# Patient Record
Sex: Male | Born: 1960 | Race: White | Hispanic: No | Marital: Married | Smoking: Former smoker
Health system: Southern US, Community
[De-identification: ages and names within clinical notes are randomized; demographics above are authoritative.]

## PROBLEM LIST (undated history)

## (undated) DIAGNOSIS — E7800 Pure hypercholesterolemia, unspecified: Secondary | ICD-10-CM

## (undated) DIAGNOSIS — J45909 Unspecified asthma, uncomplicated: Secondary | ICD-10-CM

## (undated) DIAGNOSIS — K5792 Diverticulitis of intestine, part unspecified, without perforation or abscess without bleeding: Secondary | ICD-10-CM

## (undated) DIAGNOSIS — Z87442 Personal history of urinary calculi: Secondary | ICD-10-CM

## (undated) DIAGNOSIS — K219 Gastro-esophageal reflux disease without esophagitis: Secondary | ICD-10-CM

## (undated) DIAGNOSIS — E119 Type 2 diabetes mellitus without complications: Secondary | ICD-10-CM

## (undated) DIAGNOSIS — C3490 Malignant neoplasm of unspecified part of unspecified bronchus or lung: Secondary | ICD-10-CM

## (undated) DIAGNOSIS — Z973 Presence of spectacles and contact lenses: Secondary | ICD-10-CM

## (undated) HISTORY — DX: Type 2 diabetes mellitus without complications: E11.9

## (undated) HISTORY — DX: Malignant neoplasm of unspecified part of unspecified bronchus or lung: C34.90

## (undated) HISTORY — PX: OTHER SURGICAL HISTORY: SHX169

## (undated) HISTORY — PX: COLONOSCOPY: SHX174

## (undated) HISTORY — DX: Pure hypercholesterolemia, unspecified: E78.00

## (undated) HISTORY — PX: KNEE ARTHROSCOPY WITH ANTERIOR CRUCIATE LIGAMENT (ACL) REPAIR: SHX5644

---

## 2017-07-07 DIAGNOSIS — E785 Hyperlipidemia, unspecified: Secondary | ICD-10-CM | POA: Insufficient documentation

## 2017-07-07 DIAGNOSIS — E119 Type 2 diabetes mellitus without complications: Secondary | ICD-10-CM | POA: Insufficient documentation

## 2018-02-15 DIAGNOSIS — K219 Gastro-esophageal reflux disease without esophagitis: Secondary | ICD-10-CM | POA: Insufficient documentation

## 2018-08-31 ENCOUNTER — Encounter: Payer: Self-pay | Admitting: Family Medicine

## 2018-08-31 ENCOUNTER — Emergency Department
Admission: EM | Admit: 2018-08-31 | Discharge: 2018-08-31 | Disposition: A | Discharge status: Home / Self Care | Payer: BLUE CROSS/BLUE SHIELD | Source: Home / Self Care

## 2018-08-31 ENCOUNTER — Other Ambulatory Visit: Payer: Self-pay

## 2018-08-31 DIAGNOSIS — R21 Rash and other nonspecific skin eruption: Secondary | ICD-10-CM

## 2018-08-31 DIAGNOSIS — W57XXXA Bitten or stung by nonvenomous insect and other nonvenomous arthropods, initial encounter: Secondary | ICD-10-CM

## 2018-08-31 NOTE — ED Triage Notes (Signed)
Red itchy bumps on legs since yesterday

## 2018-08-31 NOTE — Discharge Instructions (Addendum)
Treatment for this would include washing these bumps with soap and water twice a day and taking Zyrtec at night to control the itching.  You can also apply ice to the rash to help reduce the itchiness.  You should see the rash disappear over the next 3 days

## 2018-08-31 NOTE — ED Provider Notes (Signed)
[facility]     CSN: [id number] Arrival date & time: 08/31/18  1737     History   Chief Complaint Chief Complaint  Patient presents with  . Rash    HPI Jereme Loren is a 57 y.o. male.   Is a 57 year old man who presents with a rash.  He is a patient with diabetes and hypercholesterolemia.  He has had a rash since yesterday characterized by red bumps on his legs near his knees.  He went outside after the rainstorm yesterday and noted the multiple vesicles and papules about both knees afterwards.   Note from 07/18/2018: Mr. Line presents for evaluation of a wound involving the medial aspect of left ankle. The wound started as a rash 1 week ago. The rash broke open after he scratched resulting in the wound. Lesions are red, and flat in texture.Wound is painful and is pruritic.     History reviewed. No pertinent past medical history.  There are no active problems to display for this patient.   History reviewed. No pertinent surgical history.     Home Medications    Prior to Admission medications   Medication Sig Start Date End Date Taking? Authorizing Provider  glipiZIDE (GLUCOTROL) 10 MG tablet Take 10 mg by mouth daily before breakfast.   Yes [provider]  meloxicam (MOBIC) 15 MG tablet Take 15 mg by mouth daily.   Yes [provider]  metFORMIN (GLUCOPHAGE) 1000 MG tablet Take 1,000 mg by mouth 2 (two) times daily with a meal.   Yes [provider]  pravastatin (PRAVACHOL) 10 MG tablet Take 10 mg by mouth daily.   Yes [provider]    Family History No family history on file.  Social History Social History   Tobacco Use  . Smoking status: Current Every Day Smoker    Packs/day: 1.00    Years: 43.00    Pack years: 43.00    Types: Cigarettes  . Smokeless tobacco: Never Used  Substance Use Topics  . Alcohol use: Not Currently  . Drug use: Not Currently     Allergies   Patient has no known  allergies.   Review of Systems Review of Systems   Physical Exam Triage Vital Signs ED Triage Vitals  Enc Vitals Group     BP      Pulse      Resp      Temp      Temp src      SpO2      Weight      Height      Head Circumference      Peak Flow      Pain Score      Pain Loc      Pain Edu?      Excl. in [place]?    No data found.  Updated Vital Signs BP 117/76 (BP Location: Right Arm)   Pulse 85   Temp 97.6 F (36.4 C) (Oral)   Ht 6' (1.829 m)   Wt 104.3 kg   SpO2 97%   BMI 31.19 kg/m    Physical Exam  Constitutional: He is oriented to person, place, and time. He appears well-developed and well-nourished.  HENT:  Right Ear: External ear normal.  Left Ear: External ear normal.  Eyes: Conjunctivae are normal.  Neck: Normal range of motion. Neck supple.  Pulmonary/Chest: Effort normal.  Musculoskeletal: Normal range of motion.  Neurological: He is alert and oriented to person, place, and  time.  Skin: Skin is warm.  Multiple excoriated papules and vesicles in clusters on the lateral and medial aspects of both knees.  Nursing note and vitals reviewed.    UC Treatments / Results  Labs (all labs ordered are listed, but only abnormal results are displayed) Labs Reviewed - No data to display  EKG None  Radiology No results found.  Procedures Procedures (including critical care time)  Medications Ordered in UC Medications - No data to display  Initial Impression / Assessment and Plan / UC Course  I have reviewed the triage vital signs and the nursing notes.  Pertinent labs & imaging results that were available during my care of the patient were reviewed by me and considered in my medical decision making (see chart for details).    Final Clinical Impressions(s) / UC Diagnoses   Final diagnoses:  Bug bite, initial encounter     Discharge Instructions     Treatment for this would include washing these bumps with soap and water twice a day and taking  Zyrtec at night to control the itching.  You can also apply ice to the rash to help reduce the itchiness.  You should see the rash disappear over the next 3 days    ED Prescriptions    None     Controlled Substance Prescriptions  Controlled Substance Registry consulted? Not Applicable   Robyn Haber, MD 08/31/18 1810

## 2018-09-05 ENCOUNTER — Emergency Department
Admission: EM | Admit: 2018-09-05 | Discharge: 2018-09-05 | Discharge status: Absent without leave | Payer: BLUE CROSS/BLUE SHIELD | Source: Home / Self Care

## 2018-09-19 DIAGNOSIS — Z23 Encounter for immunization: Secondary | ICD-10-CM | POA: Diagnosis not present

## 2018-09-19 DIAGNOSIS — E785 Hyperlipidemia, unspecified: Secondary | ICD-10-CM | POA: Diagnosis not present

## 2018-09-19 DIAGNOSIS — E119 Type 2 diabetes mellitus without complications: Secondary | ICD-10-CM | POA: Diagnosis not present

## 2018-10-21 DIAGNOSIS — E785 Hyperlipidemia, unspecified: Secondary | ICD-10-CM | POA: Diagnosis not present

## 2018-10-21 DIAGNOSIS — M546 Pain in thoracic spine: Secondary | ICD-10-CM | POA: Diagnosis not present

## 2018-10-21 DIAGNOSIS — R05 Cough: Secondary | ICD-10-CM | POA: Diagnosis not present

## 2018-10-21 DIAGNOSIS — Z823 Family history of stroke: Secondary | ICD-10-CM | POA: Diagnosis not present

## 2018-10-23 DIAGNOSIS — M546 Pain in thoracic spine: Secondary | ICD-10-CM | POA: Diagnosis not present

## 2018-10-23 DIAGNOSIS — R05 Cough: Secondary | ICD-10-CM | POA: Diagnosis not present

## 2018-11-02 DIAGNOSIS — R05 Cough: Secondary | ICD-10-CM | POA: Diagnosis not present

## 2018-11-02 DIAGNOSIS — R0789 Other chest pain: Secondary | ICD-10-CM | POA: Diagnosis not present

## 2018-11-02 DIAGNOSIS — J209 Acute bronchitis, unspecified: Secondary | ICD-10-CM | POA: Diagnosis not present

## 2018-11-02 DIAGNOSIS — R072 Precordial pain: Secondary | ICD-10-CM | POA: Diagnosis not present

## 2018-11-02 DIAGNOSIS — F17200 Nicotine dependence, unspecified, uncomplicated: Secondary | ICD-10-CM | POA: Diagnosis not present

## 2018-11-02 DIAGNOSIS — E119 Type 2 diabetes mellitus without complications: Secondary | ICD-10-CM | POA: Diagnosis not present

## 2018-11-02 DIAGNOSIS — R079 Chest pain, unspecified: Secondary | ICD-10-CM | POA: Diagnosis not present

## 2018-11-02 DIAGNOSIS — E785 Hyperlipidemia, unspecified: Secondary | ICD-10-CM | POA: Diagnosis not present

## 2018-11-02 DIAGNOSIS — F419 Anxiety disorder, unspecified: Secondary | ICD-10-CM | POA: Diagnosis not present

## 2018-11-20 DIAGNOSIS — E785 Hyperlipidemia, unspecified: Secondary | ICD-10-CM | POA: Diagnosis not present

## 2018-11-20 DIAGNOSIS — Z823 Family history of stroke: Secondary | ICD-10-CM | POA: Diagnosis not present

## 2018-11-22 DIAGNOSIS — H524 Presbyopia: Secondary | ICD-10-CM | POA: Diagnosis not present

## 2018-11-22 DIAGNOSIS — E119 Type 2 diabetes mellitus without complications: Secondary | ICD-10-CM | POA: Diagnosis not present

## 2019-04-30 DIAGNOSIS — E119 Type 2 diabetes mellitus without complications: Secondary | ICD-10-CM | POA: Diagnosis not present

## 2019-04-30 DIAGNOSIS — E785 Hyperlipidemia, unspecified: Secondary | ICD-10-CM | POA: Diagnosis not present

## 2019-04-30 DIAGNOSIS — Z23 Encounter for immunization: Secondary | ICD-10-CM | POA: Diagnosis not present

## 2019-05-30 DIAGNOSIS — Z20828 Contact with and (suspected) exposure to other viral communicable diseases: Secondary | ICD-10-CM | POA: Diagnosis not present

## 2019-06-19 ENCOUNTER — Encounter: Payer: Self-pay | Admitting: Osteopathic Medicine

## 2019-06-19 ENCOUNTER — Ambulatory Visit (INDEPENDENT_AMBULATORY_CARE_PROVIDER_SITE_OTHER): Payer: BC Managed Care – PPO | Admitting: Osteopathic Medicine

## 2019-06-19 ENCOUNTER — Other Ambulatory Visit: Payer: Self-pay

## 2019-06-19 VITALS — BP 124/75 | HR 75 | Temp 97.9°F | Ht 72.0 in | Wt 235.6 lb

## 2019-06-19 DIAGNOSIS — E119 Type 2 diabetes mellitus without complications: Secondary | ICD-10-CM

## 2019-06-19 MED ORDER — GLIPIZIDE 5 MG PO TABS: 5.0000 mg | ORAL_TABLET | Freq: Every day | ORAL | 3 refills | Status: DC

## 2019-06-19 MED ORDER — ATORVASTATIN CALCIUM 40 MG PO TABS: 40.0000 mg | ORAL_TABLET | Freq: Every day | ORAL | 3 refills | Status: DC

## 2019-06-19 MED ORDER — METFORMIN HCL 1000 MG PO TABS: 1000.0000 mg | ORAL_TABLET | Freq: Two times a day (BID) | ORAL | 3 refills | Status: DC

## 2019-06-19 MED ORDER — MELOXICAM 15 MG PO TABS: 15.0000 mg | ORAL_TABLET | Freq: Every day | ORAL | 3 refills | Status: DC

## 2019-06-19 NOTE — Progress Notes (Signed)
HPI: William Farrell is a 58 y.o. male who  has a past medical history of Diabetes ([place]) and High cholesterol.  he presents to [facility] today, 06/19/19,  for chief complaint of: New to establish Diabetes    DIABETES SCREENING/PREVENTIVE CARE: A1C past 3-6 mos: Yes  controlled? Yes   04/30/2019 (see CE): 6.6% Current meds: Metformin 1000 mg twice daily, glipizide 5 mg daily  BP goal <130/80: Yes   BP Readings from Last 3 Encounters:  06/19/19 124/75  08/31/18 117/76   LDL goal <70: close enough! 71 in 04/2019 Eye exam annually: none on file , importance discussed with patient Foot exam: No  Microalbuminuria:needs Metformin: Yes  ACE/ARB: No  Antiplatelet if ASCVD Risk >10%: Yes  Statin: Yes  Pneumovax: yes   Immunization History  Administered Date(s) Administered  . Influenza Split 07/24/2018  . Influenza, Seasonal, Injecte, Preservative Fre 09/16/2016, 07/16/2017  . Influenza,inj,Quad PF,6+ Mos 09/16/2016, 07/31/2018  . Pneumococcal Polysaccharide-23 04/30/2019  . Tdap 09/19/2018     Past medical, surgical, social and family history reviewed:  There are no active problems to display for this patient.   Past Surgical History:  Procedure Laterality Date  . broken bone repair      Social History   Tobacco Use  . Smoking status: Current Every Day Smoker    Packs/day: 1.00    Years: 43.00    Pack years: 43.00    Types: Cigarettes  . Smokeless tobacco: Never Used  Substance Use Topics  . Alcohol use: Not Currently    Family History  Problem Relation Age of Onset  . High blood pressure Mother   . Stroke Mother      Current medication list and allergy/intolerance information reviewed:    Current Outpatient Medications  Medication Sig Dispense Refill  . glipiZIDE (GLUCOTROL) 5 MG tablet Take 1 tablet (5 mg total) by mouth daily before breakfast. 90 tablet 3  . meloxicam (MOBIC) 15 MG tablet Take 1 tablet (15 mg  total) by mouth daily. 90 tablet 3  . metFORMIN (GLUCOPHAGE) 1000 MG tablet Take 1 tablet (1,000 mg total) by mouth 2 (two) times daily with a meal. 180 tablet 3  . atorvastatin (LIPITOR) 40 MG tablet Take 1 tablet (40 mg total) by mouth daily. 90 tablet 3   No current facility-administered medications for this visit.     Allergies  Allergen Reactions  . Sitagliptin Other (See Comments)    headache      Review of Systems:  Constitutional:  No  fever, no chills, No recent illness, No unintentional weight changes. No significant fatigue.   HEENT: No  headache, no vision change, no hearing change, No sore throat, No  sinus pressure  Cardiac: No  chest pain, No  pressure, No palpitations, No  Orthopnea  Respiratory:  No  shortness of breath. No  Cough  Gastrointestinal: No  abdominal pain, No  nausea, No  vomiting,  No  blood in stool, No  diarrhea, No  constipation   Musculoskeletal: No new myalgia/arthralgia  Skin: No  Rash, No other wounds/concerning lesions  Genitourinary: No  incontinence, No  abnormal genital bleeding, No abnormal genital discharge  Hem/Onc: No  easy bruising/bleeding, No  abnormal lymph node  Endocrine: No cold intolerance,  No heat intolerance. No polyuria/polydipsia/polyphagia   Neurologic: No  weakness, No  dizziness, No  slurred speech/focal weakness/facial droop  Psychiatric: No  concerns with depression, No  concerns with anxiety, No sleep problems, No  mood problems  Exam:  BP 124/75 (BP Location: Left Arm, Patient Position: Sitting, Cuff Size: Normal)   Pulse 75   Temp 97.9 F (36.6 C) (Oral)   Ht 6' (1.829 m)   Wt 235 lb 9.6 oz (106.9 kg)   BMI 31.95 kg/m   Constitutional: VS see above. General Appearance: alert, well-developed, well-nourished, NAD  Eyes: Normal lids and conjunctive, non-icteric sclera  Neck: No masses, trachea midline. No thyroid enlargement. No tenderness/mass appreciated. No lymphadenopathy  Respiratory: Normal  respiratory effort. no wheeze, no rhonchi, no rales  Cardiovascular: S1/S2 normal, no murmur, no rub/gallop auscultated. RRR. No lower extremity edema.  Gastrointestinal: Nontender, no masses. No hepatomegaly, no splenomegaly. No hernia appreciated. Bowel sounds normal. Rectal exam deferred.   Musculoskeletal: Gait normal. No clubbing/cyanosis of digits.   Neurological: Normal balance/coordination. No tremor. No cranial nerve deficit on limited exam. Motor and sensation intact and symmetric. Cerebellar reflexes intact.   Skin: warm, dry, intact. No rash/ulcer. No concerning nevi or subq nodules on limited exam.    Psychiatric: Normal judgment/insight. Normal mood and affect. Oriented x3.    No results found for this or any previous visit (from the past 72 hour(s)).  No results found.   ASSESSMENT/PLAN: The encounter diagnosis was Controlled type 2 diabetes mellitus without complication, without long-term current use of insulin ([place]).   Orders Placed This Encounter  Procedures  . Hemoglobin A1c    Meds ordered this encounter  Medications  . glipiZIDE (GLUCOTROL) 5 MG tablet    Sig: Take 1 tablet (5 mg total) by mouth daily before breakfast.    Dispense:  90 tablet    Refill:  3  . metFORMIN (GLUCOPHAGE) 1000 MG tablet    Sig: Take 1 tablet (1,000 mg total) by mouth 2 (two) times daily with a meal.    Dispense:  180 tablet    Refill:  3  . atorvastatin (LIPITOR) 40 MG tablet    Sig: Take 1 tablet (40 mg total) by mouth daily.    Dispense:  90 tablet    Refill:  3  . meloxicam (MOBIC) 15 MG tablet    Sig: Take 1 tablet (15 mg total) by mouth daily.    Dispense:  90 tablet    Refill:  3       Visit summary with medication list and pertinent instructions was printed for patient to review. All questions at time of visit were answered - patient instructed to contact office with any additional concerns or updates. ER/RTC precautions were reviewed with the patient.     Please note: voice recognition software was used to produce this document, and typos may escape review. Please contact Dr. Sheppard Coil for any needed clarifications.     Follow-up plan: Return in about 6 weeks (around 07/31/2019) for LAB VISIT ONLY, and virtual visit w/ Dr Sheppard Coil re: Diabetes follow-up 2-3 days after that .

## 2019-08-01 ENCOUNTER — Ambulatory Visit: Payer: BC Managed Care – PPO | Admitting: Osteopathic Medicine

## 2019-09-19 ENCOUNTER — Telehealth: Payer: Self-pay

## 2019-09-19 MED ORDER — GLUCOSE BLOOD VI STRP: ORAL_STRIP | 99 refills | Status: DC

## 2019-09-19 NOTE — Telephone Encounter (Signed)
[facility] m/o pharmacy requesting 90 d/s for one touch ultra test strips 100's. New rx request.

## 2019-11-14 ENCOUNTER — Telehealth: Payer: Self-pay

## 2019-11-14 MED ORDER — ONETOUCH DELICA LANCETS 33G MISC: 99 refills | Status: DC

## 2019-11-14 NOTE — Telephone Encounter (Signed)
[facility] mail order pharmacy requesting a new rx for pt. Requesting lancets one touch delica (#992, 42A). Rx not listed in active med list.

## 2020-01-09 ENCOUNTER — Telehealth: Payer: Self-pay | Admitting: Osteopathic Medicine

## 2020-01-09 DIAGNOSIS — E119 Type 2 diabetes mellitus without complications: Secondary | ICD-10-CM

## 2020-01-09 DIAGNOSIS — Z0000 Encounter for general adult medical examination without abnormal findings: Secondary | ICD-10-CM

## 2020-01-09 NOTE — Telephone Encounter (Signed)
Wife in office today, requesting labs for his annual checkup

## 2020-01-15 LAB — CBC
HCT: 40.1 % (ref 38.5–50.0)
Hemoglobin: 13.6 g/dL (ref 13.2–17.1)
MCH: 29 pg (ref 27.0–33.0)
MCHC: 33.9 g/dL (ref 32.0–36.0)
MCV: 85.5 fL (ref 80.0–100.0)
MPV: 10.9 fL (ref 7.5–12.5)
Platelets: 232 10*3/uL (ref 140–400)
RBC: 4.69 10*6/uL (ref 4.20–5.80)
RDW: 13.3 % (ref 11.0–15.0)
WBC: 10.4 10*3/uL (ref 3.8–10.8)

## 2020-01-15 LAB — LIPID PANEL
Cholesterol: 138 mg/dL (ref <200)
HDL: 37 mg/dL — ABNORMAL LOW (ref >=40)
LDL Cholesterol (Calc): 73 mg/dL (calc)
Non-HDL Cholesterol (Calc): 101 mg/dL (calc) (ref <130)
Total CHOL/HDL Ratio: 3.7 (calc) (ref <5.0)
Triglycerides: 182 mg/dL — ABNORMAL HIGH (ref <150)

## 2020-01-15 LAB — COMPLETE METABOLIC PANEL WITH GFR
AG Ratio: 1.7 (calc) (ref 1.0–2.5)
ALT: 22 U/L (ref 9–46)
AST: 17 U/L (ref 10–35)
Albumin: 4.3 g/dL (ref 3.6–5.1)
Alkaline phosphatase (APISO): 117 U/L (ref 35–144)
BUN: 17 mg/dL (ref 7–25)
CO2: 25 mmol/L (ref 20–32)
Calcium: 9.3 mg/dL (ref 8.6–10.3)
Chloride: 103 mmol/L (ref 98–110)
Creat: 1.1 mg/dL (ref 0.70–1.33)
GFR, Est African American: 85 mL/min/{1.73_m2} (ref >=60)
GFR, Est Non African American: 73 mL/min/{1.73_m2} (ref >=60)
Globulin: 2.5 g/dL (calc) (ref 1.9–3.7)
Glucose, Bld: 164 mg/dL — ABNORMAL HIGH (ref 65–99)
Potassium: 4.4 mmol/L (ref 3.5–5.3)
Sodium: 136 mmol/L (ref 135–146)
Total Bilirubin: 0.4 mg/dL (ref 0.2–1.2)
Total Protein: 6.8 g/dL (ref 6.1–8.1)

## 2020-01-15 LAB — HEMOGLOBIN A1C
Hgb A1c MFr Bld: 7.1 % of total Hgb — ABNORMAL HIGH (ref <5.7)
Mean Plasma Glucose: 157 (calc)
eAG (mmol/L): 8.7 (calc)

## 2020-01-15 LAB — PSA, TOTAL WITH REFLEX TO PSA, FREE: PSA, Total: 0.3 ng/mL (ref <=4.0)

## 2020-01-17 ENCOUNTER — Encounter: Payer: Self-pay | Admitting: Osteopathic Medicine

## 2020-01-17 ENCOUNTER — Ambulatory Visit (INDEPENDENT_AMBULATORY_CARE_PROVIDER_SITE_OTHER): Payer: BC Managed Care – PPO | Admitting: Osteopathic Medicine

## 2020-01-17 VITALS — BP 110/67 | HR 89 | Temp 97.8°F | Wt 235.0 lb

## 2020-01-17 DIAGNOSIS — E782 Mixed hyperlipidemia: Secondary | ICD-10-CM | POA: Diagnosis not present

## 2020-01-17 DIAGNOSIS — E119 Type 2 diabetes mellitus without complications: Secondary | ICD-10-CM

## 2020-01-17 NOTE — Progress Notes (Signed)
William Farrell is a 59 y.o. male who presents to  [place] at [facility]  today, 01/17/20, seeking care for the following: . DM2 follow-up - stable . HLD follow-up - stable   ASSESSMENT & PLAN with other pertinent history/findings:  The primary encounter diagnosis was Type 2 diabetes mellitus without complication, without long-term current use of insulin ([place]). A diagnosis of Mixed hyperlipidemia was also pertinent to this visit.  Labs reviewed in detail w/ patient  A1C up a bit, will work on diet/exercise  Cholesterol stable Pt otherwise feeling well!    Follow-up instructions: Return in about 4 months (around 05/18/2020) for recheck A1C - see me sooner if needed.                                         BP 110/67 (BP Location: Left Arm, Patient Position: Sitting, Cuff Size: Normal)   Pulse 89   Temp 97.8 F (36.6 C) (Oral)   Wt 235 lb (106.6 kg)   BMI 31.87 kg/m   Current Meds  Medication Sig  . atorvastatin (LIPITOR) 40 MG tablet Take 1 tablet (40 mg total) by mouth daily.  Marland Kitchen glipiZIDE (GLUCOTROL) 5 MG tablet Take 1 tablet (5 mg total) by mouth daily before breakfast.  . glucose blood test strip Use as instructed up to qid  . meloxicam (MOBIC) 15 MG tablet Take 1 tablet (15 mg total) by mouth daily.  . metFORMIN (GLUCOPHAGE) 1000 MG tablet Take 1 tablet (1,000 mg total) by mouth 2 (two) times daily with a meal.  . OneTouch Delica Lancets 93T MISC As directed up to qid    No results found for this or any previous visit (from the past 29 hour(s)).  No results found.  Depression screen [facility] 2/9 01/17/2020 06/19/2019  Decreased Interest 0 0  Down, Depressed, Hopeless 0 0  PHQ - 2 Score 0 0  Altered sleeping 0 -  Tired, decreased energy 0 -  Change in appetite 0 -  Feeling bad or failure about yourself  0 -  Trouble concentrating 0 -  Moving slowly or fidgety/restless 0 -  Suicidal  thoughts 0 -  PHQ-9 Score 0 -    GAD 7 : Generalized Anxiety Score 01/17/2020 06/19/2019  Nervous, Anxious, on Edge 0 0  Control/stop worrying 0 0  Worry too much - different things 0 0  Trouble relaxing 0 0  Restless 0 0  Easily annoyed or irritable 0 0  Afraid - awful might happen 0 0  Total GAD 7 Score 0 0  Anxiety Difficulty - Somewhat difficult      All questions at time of visit were answered - patient instructed to contact office with any additional concerns or updates.  ER/RTC precautions were reviewed with the patient.  Please note: voice recognition software was used to produce this document, and typos may escape review. Please contact Dr. Sheppard Coil for any needed clarifications.   Total encounter time: 30 minutes.

## 2020-01-31 ENCOUNTER — Other Ambulatory Visit: Payer: Self-pay | Admitting: *Deleted

## 2020-01-31 DIAGNOSIS — F17210 Nicotine dependence, cigarettes, uncomplicated: Secondary | ICD-10-CM

## 2020-01-31 DIAGNOSIS — Z87891 Personal history of nicotine dependence: Secondary | ICD-10-CM

## 2020-01-31 NOTE — Progress Notes (Signed)
Chest  

## 2020-03-04 NOTE — Progress Notes (Signed)
Shared Decision Making Visit Lung Cancer Screening Program [phone])   Eligibility:  Age 59 y.o.  Pack Years Smoking History Calculation 43 pack year smoking hisotry (# packs/per year x # years smoked)  Recent History of coughing up blood  no  Unexplained weight loss? no ( >Than 15 pounds within the last 6 months )  Prior History Lung / other cancer no (Diagnosis within the last 5 years already requiring surveillance chest CT Scans).  Smoking Status Current Smoker  Former Smokers: Years since quit: NA  Quit Date: NA  Visit Components:  Discussion included one or more decision making aids. yes  Discussion included risk/benefits of screening. yes  Discussion included potential follow up diagnostic testing for abnormal scans. yes  Discussion included meaning and risk of over diagnosis. yes  Discussion included meaning and risk of False Positives. yes  Discussion included meaning of total radiation exposure. yes  Counseling Included:  Importance of adherence to annual lung cancer LDCT screening. yes  Impact of comorbidities on ability to participate in the program. yes  Ability and willingness to under diagnostic treatment. yes  Smoking Cessation Counseling:  Current Smokers:   Discussed importance of smoking cessation. yes  Information about tobacco cessation classes and interventions provided to patient. yes  Patient provided with "ticket" for LDCT Scan. yes  Symptomatic Patient. no  Counseling  Diagnosis Code: Tobacco Use Z72.0  Asymptomatic Patient yes  Counseling (Intermediate counseling: > three minutes counseling) Z3007  Former Smokers:   Discussed the importance of maintaining cigarette abstinence. yes  Diagnosis Code: Personal History of Nicotine Dependence. M22.633  Information about tobacco cessation classes and interventions provided to patient. Yes  Patient provided with "ticket" for LDCT Scan. yes  Written Order for Lung Cancer  Screening with LDCT placed in Epic. Yes (CT Chest Lung Cancer Screening Low Dose W/O CM) [id number] Z12.2-Screening of respiratory organs Z87.891-Personal history of nicotine dependence  I have spent 25 minutes of face to face time with Mr. Ruby discussing the risks and benefits of lung cancer screening. We viewed a power point together that explained in detail the above noted topics. We paused at intervals to allow for questions to be asked and answered to ensure understanding.We discussed that the single most powerful action that he can take to decrease his risk of developing lung cancer is to quit smoking. We discussed whether or not he is ready to commit to setting a quit date. We discussed options for tools to aid in quitting smoking including nicotine replacement therapy, non-nicotine medications, support groups, Quit Smart classes, and behavior modification. We discussed that often times setting smaller, more achievable goals, such as eliminating 1 cigarette a day for a week and then 2 cigarettes a day for a week can be helpful in slowly decreasing the number of cigarettes smoked. This allows for a sense of accomplishment as well as providing a clinical benefit. I gave him the " Be Stronger Than Your Excuses" card with contact information for community resources, classes, free nicotine replacement therapy, and access to mobile apps, text messaging, and on-line smoking cessation help. I have also given him my card and contact information in the event he needs to contact me. We discussed the time and location of the scan, and that either Doroteo Glassman RN or I will call with the results within 24-48 hours of receiving them. I have offered him  a copy of the power point we viewed  as a resource in the event they need reinforcement of  the concepts we discussed today in the office. The patient verbalized understanding of all of  the above and had no further questions upon leaving the office. They have my  contact information in the event they have any further questions.  I spent 4 minutes counseling on smoking cessation and the health risks of continued tobacco abuse.  I explained to the patient that there has been a high incidence of coronary artery disease noted on these exams. I explained that this is a non-gated exam therefore degree or severity cannot be determined. This patient is currently on statin therapy. I have asked the patient to follow-up with their PCP regarding any incidental finding of coronary artery disease and management with diet or medication as their PCP  feels is clinically indicated. The patient verbalized understanding of the above and had no further questions upon completion of the visit.   Pt. States he is trying to cut down on the number of cigarettes he smokes daily. States his job is stressful, which is why he has not been successful at smoking cessation in the past.   Magdalen Spatz, NP  03/05/2020

## 2020-03-05 ENCOUNTER — Other Ambulatory Visit: Payer: Self-pay

## 2020-03-05 ENCOUNTER — Encounter: Payer: Self-pay | Admitting: Acute Care

## 2020-03-05 ENCOUNTER — Ambulatory Visit (INDEPENDENT_AMBULATORY_CARE_PROVIDER_SITE_OTHER): Payer: BC Managed Care – PPO

## 2020-03-05 ENCOUNTER — Telehealth: Payer: Self-pay | Admitting: Acute Care

## 2020-03-05 ENCOUNTER — Ambulatory Visit (INDEPENDENT_AMBULATORY_CARE_PROVIDER_SITE_OTHER): Payer: BC Managed Care – PPO | Admitting: Acute Care

## 2020-03-05 DIAGNOSIS — Z716 Tobacco abuse counseling: Secondary | ICD-10-CM | POA: Diagnosis not present

## 2020-03-05 DIAGNOSIS — F17210 Nicotine dependence, cigarettes, uncomplicated: Secondary | ICD-10-CM

## 2020-03-05 DIAGNOSIS — Z87891 Personal history of nicotine dependence: Secondary | ICD-10-CM | POA: Diagnosis not present

## 2020-03-05 DIAGNOSIS — Z122 Encounter for screening for malignant neoplasm of respiratory organs: Secondary | ICD-10-CM | POA: Diagnosis not present

## 2020-03-05 NOTE — Telephone Encounter (Signed)
Lung CA screening dated 03/05/20:     IMPRESSION: Lung-RADS 4B, suspicious. Additional imaging evaluation or consultation with Pulmonology or Thoracic Surgery recommended.  Masslike opacity in the medial right upper lobe/suprahilar region, measuring approximately 3.8 cm highly suspicious for primary bronchogenic neoplasm. Associated mediastinal lymphadenopathy, suspicious for nodal metastases. Discussion at multidisciplinary tumor board is suggested. Consider bronchoscopy and/or PET-CT, as clinically warranted.  These results will be called to the ordering clinician or representative by the Radiologist Assistant, and communication documented in the PACS or [employer].  Aortic Atherosclerosis (ICD10-I70.0) and Emphysema (ICD10-J43.9).   Electronically Signed   By: Julian Hy M.D.   On: 03/05/2020 15:35  Routing to Judson Roch marked urgent

## 2020-03-05 NOTE — Patient Instructions (Signed)
Thank you for participating in the [facility] Cancer Screening Program. It was our pleasure to meet you today. We will call you with the results of your scan within the next few days. Your scan will be assigned a Lung RADS category score by the physicians reading the scans.  This Lung RADS score determines follow up scanning.  See below for description of categories, and follow up screening recommendations. We will be in touch to schedule your follow up screening annually or based on recommendations of our providers. We will fax a copy of your scan results to your Primary Care Physician, or the physician who referred you to the program, to ensure they have the results. Please call the office if you have any questions or concerns regarding your scanning experience or results.  Our office number is [phone]. Please speak with Denise Phelps, RN. She is our Lung Cancer Screening RN. If she is unavailable when you call, please have the office staff send her a message. She will return your call at her earliest convenience. Remember, if your scan is normal, we will scan you annually as long as you continue to meet the criteria for the program. (Age 55-77, Current smoker or smoker who has quit within the last 15 years). If you are a smoker, remember, quitting is the single most powerful action that you can take to decrease your risk of lung cancer and other pulmonary, breathing related problems. We know quitting is hard, and we are here to help.  Please let us know if there is anything we can do to help you meet your goal of quitting. If you are a former smoker, congratulations. We are proud of you! Remain smoke free! Remember you can refer friends or family members through the number above.  We will screen them to make sure they meet criteria for the program. Thank you for helping us take better care of you by participating in Lung Screening.  Lung RADS Categories:  Lung RADS 1: no nodules  or definitely non-concerning nodules.  Recommendation is for a repeat annual scan in 12 months.  Lung RADS 2:  nodules that are non-concerning in appearance and behavior with a very low likelihood of becoming an active cancer. Recommendation is for a repeat annual scan in 12 months.  Lung RADS 3: nodules that are probably non-concerning , includes nodules with a low likelihood of becoming an active cancer.  Recommendation is for a 6-month repeat screening scan. Often noted after an upper respiratory illness. We will be in touch to make sure you have no questions, and to schedule your 6-month scan.  Lung RADS 4 A: nodules with concerning findings, recommendation is most often for a follow up scan in 3 months or additional testing based on our provider's assessment of the scan. We will be in touch to make sure you have no questions and to schedule the recommended 3 month follow up scan.  Lung RADS 4 B:  indicates findings that are concerning. We will be in touch with you to schedule additional diagnostic testing based on our provider's  assessment of the scan.   

## 2020-03-06 ENCOUNTER — Telehealth: Payer: Self-pay | Admitting: Acute Care

## 2020-03-06 ENCOUNTER — Ambulatory Visit: Payer: BC Managed Care – PPO | Admitting: Pulmonary Disease

## 2020-03-06 ENCOUNTER — Other Ambulatory Visit: Payer: Self-pay

## 2020-03-06 ENCOUNTER — Encounter: Payer: Self-pay | Admitting: Pulmonary Disease

## 2020-03-06 VITALS — BP 118/76 | HR 71 | Ht 72.0 in | Wt 235.4 lb

## 2020-03-06 DIAGNOSIS — R918 Other nonspecific abnormal finding of lung field: Secondary | ICD-10-CM

## 2020-03-06 DIAGNOSIS — R590 Localized enlarged lymph nodes: Secondary | ICD-10-CM | POA: Diagnosis not present

## 2020-03-06 NOTE — Progress Notes (Signed)
Synopsis: Referred in April 2021 for abnormal CT chest by No ref. provider found  Subjective:   PATIENT ID: William Farrell: male DOB: Mar 30, 1961, MRN: [id number]  Chief Complaint  Patient presents with  . Consult    Pt had a shared decision visit with Eric Form and after having the CT performed, pt is now here for the visit with BI. Pt states he does have occ complaints with SOB due to exertion.    This is a 59 year old gentleman past medical history of diabetes and high cholesterol.  Patient was enrolled in our lung cancer screening program.  Patient was seen yesterday completed his shared decision-making visit and had his initial lung cancer screening completed.  This revealed a 3.8 cm lung mass with associated mediastinal and hilar adenopathy.  Case was discussed this morning with Eric Form, NP.  Patient was brought into the clinic to be worked down and discuss CT results.  Patient obviously very anxious today.  He denies any symptoms except for cough and occasional sputum production.  Weight has been stable eating okay.  No pain.  Denies hemoptysis.  Of note he is still smoking.   Past Medical History:  Diagnosis Date  . Diabetes ([place])   . High cholesterol      Family History  Problem Relation Age of Onset  . High blood pressure Mother   . Stroke Mother      Past Surgical History:  Procedure Laterality Date  . broken bone repair      Social History   Socioeconomic History  . Marital status: Married    Spouse name: Not on file  . Number of children: Not on file  . Years of education: Not on file  . Highest education level: Not on file  Occupational History  . Occupation: Research officer, trade union: [employer]   Tobacco Use  . Smoking status: Current Every Day Smoker    Packs/day: 2.00    Years: 43.00    Pack years: 86.00    Types: Cigarettes  . Smokeless tobacco: Never Used  . Tobacco comment: currently smoking 15cigs daily as of 03/06/20    Substance and Sexual Activity  . Alcohol use: Not Currently  . Drug use: Not Currently  . Sexual activity: Yes    Partners: Female    Birth control/protection: None  Other Topics Concern  . Not on file  Social History Narrative  . Not on file   Social Determinants of Health   Financial Resource Strain:   . Difficulty of Paying Living Expenses:   Food Insecurity:   . Worried About Charity fundraiser in the Last Year:   . Arboriculturist in the Last Year:   Transportation Needs:   . Film/video editor (Medical):   Marland Kitchen Lack of Transportation (Non-Medical):   Physical Activity:   . Days of Exercise per Week:   . Minutes of Exercise per Session:   Stress:   . Feeling of Stress :   Social Connections:   . Frequency of Communication with Friends and Family:   . Frequency of Social Gatherings with Friends and Family:   . Attends Religious Services:   . Active Member of Clubs or Organizations:   . Attends Archivist Meetings:   Marland Kitchen Marital Status:   Intimate Partner Violence:   . Fear of Current or Ex-Partner:   . Emotionally Abused:   Marland Kitchen Physically Abused:   . Sexually Abused:  Allergies  Allergen Reactions  . Sitagliptin Other (See Comments)    headache     Outpatient Medications Prior to Visit  Medication Sig Dispense Refill  . albuterol (VENTOLIN HFA) 108 (90 Base) MCG/ACT inhaler Inhale into the lungs.    Marland Kitchen atorvastatin (LIPITOR) 40 MG tablet Take 1 tablet (40 mg total) by mouth daily. 90 tablet 3  . glipiZIDE (GLUCOTROL) 5 MG tablet Take 1 tablet (5 mg total) by mouth daily before breakfast. 90 tablet 3  . glucose blood test strip Use as instructed up to qid 100 each 99  . meloxicam (MOBIC) 15 MG tablet Take 1 tablet (15 mg total) by mouth daily. 90 tablet 3  . metFORMIN (GLUCOPHAGE) 1000 MG tablet Take 1 tablet (1,000 mg total) by mouth 2 (two) times daily with a meal. 180 tablet 3  . OneTouch Delica Lancets 94W MISC As directed up to qid 100 each  99  . Aspirin Buf,CaCarb-MgCarb-MgO, 81 MG TABS Take by mouth.     No facility-administered medications prior to visit.    Review of Systems  Constitutional: Negative for chills, fever, malaise/fatigue and weight loss.  HENT: Negative for hearing loss, sore throat and tinnitus.   Eyes: Negative for blurred vision and double vision.  Respiratory: Positive for cough and sputum production. Negative for hemoptysis, shortness of breath, wheezing and stridor.   Cardiovascular: Negative for chest pain, palpitations, orthopnea, leg swelling and PND.  Gastrointestinal: Negative for abdominal pain, constipation, diarrhea, heartburn, nausea and vomiting.  Genitourinary: Negative for dysuria, hematuria and urgency.  Musculoskeletal: Negative for joint pain and myalgias.  Skin: Negative for itching and rash.  Neurological: Negative for dizziness, tingling, weakness and headaches.  Endo/Heme/Allergies: Negative for environmental allergies. Does not bruise/bleed easily.  Psychiatric/Behavioral: Negative for depression. The patient is not nervous/anxious and does not have insomnia.   All other systems reviewed and are negative.    Objective:  Physical Exam Vitals reviewed.  Constitutional:      General: He is not in acute distress.    Appearance: He is well-developed.  HENT:     Head: Normocephalic and atraumatic.  Eyes:     General: No scleral icterus.    Conjunctiva/sclera: Conjunctivae normal.     Pupils: Pupils are equal, round, and reactive to light.  Neck:     Vascular: No JVD.     Trachea: No tracheal deviation.  Cardiovascular:     Rate and Rhythm: Normal rate and regular rhythm.     Heart sounds: Normal heart sounds. No murmur.  Pulmonary:     Effort: Pulmonary effort is normal. No tachypnea, accessory muscle usage or respiratory distress.     Breath sounds: Normal breath sounds. No stridor. No wheezing, rhonchi or rales.  Abdominal:     General: Bowel sounds are normal. There  is no distension.     Palpations: Abdomen is soft.     Tenderness: There is no abdominal tenderness.  Musculoskeletal:        General: No tenderness.     Cervical back: Neck supple.  Lymphadenopathy:     Cervical: No cervical adenopathy.  Skin:    General: Skin is warm and dry.     Capillary Refill: Capillary refill takes less than 2 seconds.     Findings: No rash.  Neurological:     Mental Status: He is alert and oriented to person, place, and time.  Psychiatric:        Behavior: Behavior normal.      Vitals:  03/06/20 1040  BP: 118/76  Pulse: 71  SpO2: 96%  Weight: 235 lb 6.4 oz (106.8 kg)  Height: 6' (1.829 m)   96% on RA BMI Readings from Last 3 Encounters:  03/06/20 31.93 kg/m  01/17/20 31.87 kg/m  06/19/19 31.95 kg/m   Wt Readings from Last 3 Encounters:  03/06/20 235 lb 6.4 oz (106.8 kg)  01/17/20 235 lb (106.6 kg)  06/19/19 235 lb 9.6 oz (106.9 kg)     CBC    Component Value Date/Time   WBC 10.4 01/14/2020 0859   RBC 4.69 01/14/2020 0859   HGB 13.6 01/14/2020 0859   HCT 40.1 01/14/2020 0859   PLT 232 01/14/2020 0859   MCV 85.5 01/14/2020 0859   MCH 29.0 01/14/2020 0859   MCHC 33.9 01/14/2020 0859   RDW 13.3 01/14/2020 0859     Chest Imaging: 03/05/2020: CT chest lung cancer screening 3.8 cm right hilar mass with associated mediastinal adenopathy concerning for primary bronchogenic carcinoma.  Advanced stage.  The patient's images have been independently reviewed by me.     Pulmonary Functions Testing Results: No flowsheet data found.  FeNO: none   Pathology: none   Echocardiogram: none   Heart Catheterization: none     Assessment & Plan:     ICD-10-CM   1. Lung mass  R91.8 Ambulatory referral to Pulmonology    NM PET Image Initial (PI) Skull Base To Thigh  2. Mediastinal adenopathy  R59.0 Ambulatory referral to Pulmonology    NM PET Image Initial (PI) Skull Base To Thigh    Assessment:   This is a 59 year old gentleman with  a new diagnosis lung mass and mediastinal adenopathy concerning for a primary bronchogenic carcinoma, base a stage III lung cancer on imaging.  We discussed at length today the prognosis associated with lung cancer.  And rightfully the patient is anxious after receiving new diagnosis  Plan: Today in the office we discussed risk benefits alternatives of proceeding with invasive tissue diagnosis to include video bronchoscopy with endobronchial ultrasound and transbronchial needle aspiration biopsies. We discussed risk including bleeding as well as pneumothorax, hemothorax or damage to mediastinal structures. Patient understands these risks and is willing to proceed.  He is currently taken 81 mg aspirin.  It is okay for him to continue this however the patient says that he would stop between now and his procedure. We will plan to have his procedure on 03/11/2020 at [facility] endoscopy. I have placed order for nuclear medicine pet imaging to complete staging. After bronchoscopy with confirmation of tissue diagnosis likely needs MRI of the brain. We will also place appropriate referrals to medical oncology and radiation oncology.  Greater than 50% of this patient's 48-minute office visit was spent face-to-face discussing above recommendations and treatment plan.    Current Outpatient Medications:  .  albuterol (VENTOLIN HFA) 108 (90 Base) MCG/ACT inhaler, Inhale into the lungs., Disp: , Rfl:  .  atorvastatin (LIPITOR) 40 MG tablet, Take 1 tablet (40 mg total) by mouth daily., Disp: 90 tablet, Rfl: 3 .  glipiZIDE (GLUCOTROL) 5 MG tablet, Take 1 tablet (5 mg total) by mouth daily before breakfast., Disp: 90 tablet, Rfl: 3 .  glucose blood test strip, Use as instructed up to qid, Disp: 100 each, Rfl: 99 .  meloxicam (MOBIC) 15 MG tablet, Take 1 tablet (15 mg total) by mouth daily., Disp: 90 tablet, Rfl: 3 .  metFORMIN (GLUCOPHAGE) 1000 MG tablet, Take 1 tablet (1,000 mg total) by mouth 2 (two)  times  daily with a meal., Disp: 180 tablet, Rfl: 3 .  OneTouch Delica Lancets 02B MISC, As directed up to qid, Disp: 100 each, Rfl: 99 .  Aspirin Buf,CaCarb-MgCarb-MgO, 81 MG TABS, Take by mouth., Disp: , Rfl:    Garner Nash, DO [facility] 03/06/2020 11:32 AM

## 2020-03-06 NOTE — Telephone Encounter (Signed)
I have called William Farrell with the results of his LDCT. I explained that his scan was abnormal. I told him that there is a large mass in his right upper lobe. I explained that we will need to determine next steps to further evaluate the mass.  I explained that I have reviewed the scan with Dr. Valeta Harms who has availability to see the patient today at 11 am. He states he will make arrangements to be here at 11 am to see Dr. Valeta Harms. I apologized that I was giving him this news over the phone, but that we wanted to expedite  getting him seen and scheduled for a biopsy. Dr Valeta Harms was in the office today and had availability this am to facilitate prompt care .

## 2020-03-06 NOTE — H&P (View-Only) (Signed)
Synopsis: Referred in April 2021 for abnormal CT chest by No ref. provider found  Subjective:   PATIENT ID: William Farrell GENDER: male DOB: 31-Mar-1961, MRN: [id number]  Chief Complaint  Patient presents with  . Consult    Pt had a shared decision visit with Eric Form and after having the CT performed, pt is now here for the visit with BI. Pt states he does have occ complaints with SOB due to exertion.    This is a 59 year old gentleman past medical history of diabetes and high cholesterol.  Patient was enrolled in our lung cancer screening program.  Patient was seen yesterday completed his shared decision-making visit and had his initial lung cancer screening completed.  This revealed a 3.8 cm lung mass with associated mediastinal and hilar adenopathy.  Case was discussed this morning with Eric Form, NP.  Patient was brought into the clinic to be worked down and discuss CT results.  Patient obviously very anxious today.  He denies any symptoms except for cough and occasional sputum production.  Weight has been stable eating okay.  No pain.  Denies hemoptysis.  Of note he is still smoking.   Past Medical History:  Diagnosis Date  . Diabetes ([place])   . High cholesterol      Family History  Problem Relation Age of Onset  . High blood pressure Mother   . Stroke Mother      Past Surgical History:  Procedure Laterality Date  . broken bone repair      Social History   Socioeconomic History  . Marital status: Married    Spouse name: Not on file  . Number of children: Not on file  . Years of education: Not on file  . Highest education level: Not on file  Occupational History  . Occupation: Research officer, trade union: [employer]   Tobacco Use  . Smoking status: Current Every Day Smoker    Packs/day: 2.00    Years: 43.00    Pack years: 86.00    Types: Cigarettes  . Smokeless tobacco: Never Used  . Tobacco comment: currently smoking 15cigs daily as of 03/06/20    Substance and Sexual Activity  . Alcohol use: Not Currently  . Drug use: Not Currently  . Sexual activity: Yes    Partners: Female    Birth control/protection: None  Other Topics Concern  . Not on file  Social History Narrative  . Not on file   Social Determinants of Health   Financial Resource Strain:   . Difficulty of Paying Living Expenses:   Food Insecurity:   . Worried About Charity fundraiser in the Last Year:   . Arboriculturist in the Last Year:   Transportation Needs:   . Film/video editor (Medical):   Marland Kitchen Lack of Transportation (Non-Medical):   Physical Activity:   . Days of Exercise per Week:   . Minutes of Exercise per Session:   Stress:   . Feeling of Stress :   Social Connections:   . Frequency of Communication with Friends and Family:   . Frequency of Social Gatherings with Friends and Family:   . Attends Religious Services:   . Active Member of Clubs or Organizations:   . Attends Archivist Meetings:   Marland Kitchen Marital Status:   Intimate Partner Violence:   . Fear of Current or Ex-Partner:   . Emotionally Abused:   Marland Kitchen Physically Abused:   . Sexually Abused:  Allergies  Allergen Reactions  . Sitagliptin Other (See Comments)    headache     Outpatient Medications Prior to Visit  Medication Sig Dispense Refill  . albuterol (VENTOLIN HFA) 108 (90 Base) MCG/ACT inhaler Inhale into the lungs.    Marland Kitchen atorvastatin (LIPITOR) 40 MG tablet Take 1 tablet (40 mg total) by mouth daily. 90 tablet 3  . glipiZIDE (GLUCOTROL) 5 MG tablet Take 1 tablet (5 mg total) by mouth daily before breakfast. 90 tablet 3  . glucose blood test strip Use as instructed up to qid 100 each 99  . meloxicam (MOBIC) 15 MG tablet Take 1 tablet (15 mg total) by mouth daily. 90 tablet 3  . metFORMIN (GLUCOPHAGE) 1000 MG tablet Take 1 tablet (1,000 mg total) by mouth 2 (two) times daily with a meal. 180 tablet 3  . OneTouch Delica Lancets 60A MISC As directed up to qid 100 each  99  . Aspirin Buf,CaCarb-MgCarb-MgO, 81 MG TABS Take by mouth.     No facility-administered medications prior to visit.    Review of Systems  Constitutional: Negative for chills, fever, malaise/fatigue and weight loss.  HENT: Negative for hearing loss, sore throat and tinnitus.   Eyes: Negative for blurred vision and double vision.  Respiratory: Positive for cough and sputum production. Negative for hemoptysis, shortness of breath, wheezing and stridor.   Cardiovascular: Negative for chest pain, palpitations, orthopnea, leg swelling and PND.  Gastrointestinal: Negative for abdominal pain, constipation, diarrhea, heartburn, nausea and vomiting.  Genitourinary: Negative for dysuria, hematuria and urgency.  Musculoskeletal: Negative for joint pain and myalgias.  Skin: Negative for itching and rash.  Neurological: Negative for dizziness, tingling, weakness and headaches.  Endo/Heme/Allergies: Negative for environmental allergies. Does not bruise/bleed easily.  Psychiatric/Behavioral: Negative for depression. The patient is not nervous/anxious and does not have insomnia.   All other systems reviewed and are negative.    Objective:  Physical Exam Vitals reviewed.  Constitutional:      General: He is not in acute distress.    Appearance: He is well-developed.  HENT:     Head: Normocephalic and atraumatic.  Eyes:     General: No scleral icterus.    Conjunctiva/sclera: Conjunctivae normal.     Pupils: Pupils are equal, round, and reactive to light.  Neck:     Vascular: No JVD.     Trachea: No tracheal deviation.  Cardiovascular:     Rate and Rhythm: Normal rate and regular rhythm.     Heart sounds: Normal heart sounds. No murmur.  Pulmonary:     Effort: Pulmonary effort is normal. No tachypnea, accessory muscle usage or respiratory distress.     Breath sounds: Normal breath sounds. No stridor. No wheezing, rhonchi or rales.  Abdominal:     General: Bowel sounds are normal. There  is no distension.     Palpations: Abdomen is soft.     Tenderness: There is no abdominal tenderness.  Musculoskeletal:        General: No tenderness.     Cervical back: Neck supple.  Lymphadenopathy:     Cervical: No cervical adenopathy.  Skin:    General: Skin is warm and dry.     Capillary Refill: Capillary refill takes less than 2 seconds.     Findings: No rash.  Neurological:     Mental Status: He is alert and oriented to person, place, and time.  Psychiatric:        Behavior: Behavior normal.      Vitals:  03/06/20 1040  BP: 118/76  Pulse: 71  SpO2: 96%  Weight: 235 lb 6.4 oz (106.8 kg)  Height: 6' (1.829 m)   96% on RA BMI Readings from Last 3 Encounters:  03/06/20 31.93 kg/m  01/17/20 31.87 kg/m  06/19/19 31.95 kg/m   Wt Readings from Last 3 Encounters:  03/06/20 235 lb 6.4 oz (106.8 kg)  01/17/20 235 lb (106.6 kg)  06/19/19 235 lb 9.6 oz (106.9 kg)     CBC    Component Value Date/Time   WBC 10.4 01/14/2020 0859   RBC 4.69 01/14/2020 0859   HGB 13.6 01/14/2020 0859   HCT 40.1 01/14/2020 0859   PLT 232 01/14/2020 0859   MCV 85.5 01/14/2020 0859   MCH 29.0 01/14/2020 0859   MCHC 33.9 01/14/2020 0859   RDW 13.3 01/14/2020 0859     Chest Imaging: 03/05/2020: CT chest lung cancer screening 3.8 cm right hilar mass with associated mediastinal adenopathy concerning for primary bronchogenic carcinoma.  Advanced stage.  The patient's images have been independently reviewed by me.     Pulmonary Functions Testing Results: No flowsheet data found.  FeNO: none   Pathology: none   Echocardiogram: none   Heart Catheterization: none     Assessment & Plan:     ICD-10-CM   1. Lung mass  R91.8 Ambulatory referral to Pulmonology    NM PET Image Initial (PI) Skull Base To Thigh  2. Mediastinal adenopathy  R59.0 Ambulatory referral to Pulmonology    NM PET Image Initial (PI) Skull Base To Thigh    Assessment:   This is a 59 year old gentleman with  a new diagnosis lung mass and mediastinal adenopathy concerning for a primary bronchogenic carcinoma, base a stage III lung cancer on imaging.  We discussed at length today the prognosis associated with lung cancer.  And rightfully the patient is anxious after receiving new diagnosis  Plan: Today in the office we discussed risk benefits alternatives of proceeding with invasive tissue diagnosis to include video bronchoscopy with endobronchial ultrasound and transbronchial needle aspiration biopsies. We discussed risk including bleeding as well as pneumothorax, hemothorax or damage to mediastinal structures. Patient understands these risks and is willing to proceed.  He is currently taken 81 mg aspirin.  It is okay for him to continue this however the patient says that he would stop between now and his procedure. We will plan to have his procedure on 03/11/2020 at [facility] endoscopy. I have placed order for nuclear medicine pet imaging to complete staging. After bronchoscopy with confirmation of tissue diagnosis likely needs MRI of the brain. We will also place appropriate referrals to medical oncology and radiation oncology.  Greater than 50% of this patient's 48-minute office visit was spent face-to-face discussing above recommendations and treatment plan.    Current Outpatient Medications:  .  albuterol (VENTOLIN HFA) 108 (90 Base) MCG/ACT inhaler, Inhale into the lungs., Disp: , Rfl:  .  atorvastatin (LIPITOR) 40 MG tablet, Take 1 tablet (40 mg total) by mouth daily., Disp: 90 tablet, Rfl: 3 .  glipiZIDE (GLUCOTROL) 5 MG tablet, Take 1 tablet (5 mg total) by mouth daily before breakfast., Disp: 90 tablet, Rfl: 3 .  glucose blood test strip, Use as instructed up to qid, Disp: 100 each, Rfl: 99 .  meloxicam (MOBIC) 15 MG tablet, Take 1 tablet (15 mg total) by mouth daily., Disp: 90 tablet, Rfl: 3 .  metFORMIN (GLUCOPHAGE) 1000 MG tablet, Take 1 tablet (1,000 mg total) by mouth 2 (two)  times  daily with a meal., Disp: 180 tablet, Rfl: 3 .  OneTouch Delica Lancets 76J MISC, As directed up to qid, Disp: 100 each, Rfl: 99 .  Aspirin Buf,CaCarb-MgCarb-MgO, 81 MG TABS, Take by mouth., Disp: , Rfl:    Garner Nash, DO [facility] 03/06/2020 11:32 AM

## 2020-03-06 NOTE — Progress Notes (Signed)
Please fax a copy of the results to the PCP and put on tickle list to check results of biopsy to determine if he will continue to be followed in the program.Thanks so much

## 2020-03-06 NOTE — Telephone Encounter (Signed)
I will cal the patient. Thanks so much.

## 2020-03-06 NOTE — Progress Notes (Signed)
These results have been called to the patient. He verbalized understanding of them both. He has an appointment to see Dr. Valeta Harms today at 11 am.

## 2020-03-06 NOTE — Patient Instructions (Addendum)
Thank you for visiting Dr. Valeta Harms at [facility]. Today we recommend the following:  Orders Placed This Encounter  Procedures  . NM PET Image Initial (PI) Skull Base To Thigh  . Ambulatory referral to Pulmonology   Plan for bronchoscopy with biopsy on 03/11/2020  Return in about 4 weeks (around 04/03/2020) for with APP or Dr. Valeta Harms.    Please do your part to reduce the spread of COVID-19.

## 2020-03-07 ENCOUNTER — Other Ambulatory Visit: Payer: Self-pay

## 2020-03-07 ENCOUNTER — Encounter (HOSPITAL_COMMUNITY): Payer: Self-pay | Admitting: Pulmonary Disease

## 2020-03-07 NOTE — Progress Notes (Signed)
Spoke with pt for pre-op call. Pt denies cardiac history and HTN. Pt is a type 2 diabetic. Last A1C was 7.1 on 01/14/20. He states his fasting blood sugar is usually between 140-150. Pt instructed not to take his Glipizide and Metformin the day of surgery. Instructed pt to check his blood sugar when he gets up Tuesday AM and every 2 hours until he leaves for the hospital. If blood sugar is 70 or below, treat with 1/2 cup of clear juice (apple or cranberry) and recheck blood sugar 15 minutes after drinking juice. If blood sugar continues to be 70 or below, call the Short Stay department and ask to speak to a nurse. Pt voiced understanding.  Covid test scheduled for Saturday. Instructed pt that he needs to quarantine after the test is done and until Tuesday when he comes to the hospital. Pt voiced understanding.

## 2020-03-08 ENCOUNTER — Other Ambulatory Visit (HOSPITAL_COMMUNITY)
Admission: RE | Admit: 2020-03-08 | Discharge: 2020-03-08 | Disposition: A | Discharge status: Home / Self Care | Payer: BC Managed Care – PPO | Source: Ambulatory Visit | Attending: Pulmonary Disease | Admitting: Pulmonary Disease

## 2020-03-08 DIAGNOSIS — Z20822 Contact with and (suspected) exposure to covid-19: Secondary | ICD-10-CM | POA: Insufficient documentation

## 2020-03-08 DIAGNOSIS — Z01812 Encounter for preprocedural laboratory examination: Secondary | ICD-10-CM | POA: Diagnosis not present

## 2020-03-09 LAB — SARS CORONAVIRUS 2 (TAT 6-24 HRS): SARS Coronavirus 2: NEGATIVE

## 2020-03-10 ENCOUNTER — Telehealth: Payer: Self-pay | Admitting: Pulmonary Disease

## 2020-03-10 NOTE — Telephone Encounter (Signed)
ATC patient x2 - line busy.  Typically medical records requests need to go through the medical records dept @ [phone].

## 2020-03-11 ENCOUNTER — Ambulatory Visit (HOSPITAL_COMMUNITY): Payer: BC Managed Care – PPO | Admitting: Certified Registered"

## 2020-03-11 ENCOUNTER — Encounter (HOSPITAL_COMMUNITY): Payer: Self-pay | Admitting: Pulmonary Disease

## 2020-03-11 ENCOUNTER — Ambulatory Visit (HOSPITAL_COMMUNITY)
Admission: RE | Admit: 2020-03-11 | Discharge: 2020-03-11 | Disposition: A | Discharge status: Home / Self Care | Payer: BC Managed Care – PPO | Attending: Pulmonary Disease | Admitting: Pulmonary Disease

## 2020-03-11 ENCOUNTER — Encounter (HOSPITAL_COMMUNITY)
Admission: RE | Disposition: A | Discharge status: Home / Self Care | Payer: Self-pay | Source: Home / Self Care | Attending: Pulmonary Disease

## 2020-03-11 ENCOUNTER — Other Ambulatory Visit: Payer: Self-pay

## 2020-03-11 DIAGNOSIS — F17210 Nicotine dependence, cigarettes, uncomplicated: Secondary | ICD-10-CM | POA: Diagnosis not present

## 2020-03-11 DIAGNOSIS — Z791 Long term (current) use of non-steroidal anti-inflammatories (NSAID): Secondary | ICD-10-CM | POA: Diagnosis not present

## 2020-03-11 DIAGNOSIS — Z7984 Long term (current) use of oral hypoglycemic drugs: Secondary | ICD-10-CM | POA: Diagnosis not present

## 2020-03-11 DIAGNOSIS — E7800 Pure hypercholesterolemia, unspecified: Secondary | ICD-10-CM | POA: Insufficient documentation

## 2020-03-11 DIAGNOSIS — R918 Other nonspecific abnormal finding of lung field: Secondary | ICD-10-CM | POA: Diagnosis not present

## 2020-03-11 DIAGNOSIS — R590 Localized enlarged lymph nodes: Secondary | ICD-10-CM | POA: Diagnosis not present

## 2020-03-11 DIAGNOSIS — J45909 Unspecified asthma, uncomplicated: Secondary | ICD-10-CM | POA: Diagnosis not present

## 2020-03-11 DIAGNOSIS — Z79899 Other long term (current) drug therapy: Secondary | ICD-10-CM | POA: Insufficient documentation

## 2020-03-11 DIAGNOSIS — C3411 Malignant neoplasm of upper lobe, right bronchus or lung: Secondary | ICD-10-CM | POA: Diagnosis not present

## 2020-03-11 DIAGNOSIS — E119 Type 2 diabetes mellitus without complications: Secondary | ICD-10-CM | POA: Diagnosis not present

## 2020-03-11 DIAGNOSIS — Z7982 Long term (current) use of aspirin: Secondary | ICD-10-CM | POA: Diagnosis not present

## 2020-03-11 DIAGNOSIS — C771 Secondary and unspecified malignant neoplasm of intrathoracic lymph nodes: Secondary | ICD-10-CM | POA: Insufficient documentation

## 2020-03-11 DIAGNOSIS — C3491 Malignant neoplasm of unspecified part of right bronchus or lung: Secondary | ICD-10-CM | POA: Diagnosis not present

## 2020-03-11 DIAGNOSIS — E785 Hyperlipidemia, unspecified: Secondary | ICD-10-CM | POA: Diagnosis not present

## 2020-03-11 DIAGNOSIS — K219 Gastro-esophageal reflux disease without esophagitis: Secondary | ICD-10-CM | POA: Diagnosis not present

## 2020-03-11 HISTORY — DX: Unspecified asthma, uncomplicated: J45.909

## 2020-03-11 HISTORY — DX: Personal history of urinary calculi: Z87.442

## 2020-03-11 HISTORY — PX: CRYOTHERAPY: SHX6894

## 2020-03-11 HISTORY — DX: Diverticulitis of intestine, part unspecified, without perforation or abscess without bleeding: K57.92

## 2020-03-11 HISTORY — PX: FINE NEEDLE ASPIRATION: SHX5430

## 2020-03-11 HISTORY — DX: Gastro-esophageal reflux disease without esophagitis: K21.9

## 2020-03-11 HISTORY — PX: VIDEO BRONCHOSCOPY WITH ENDOBRONCHIAL ULTRASOUND: SHX6177

## 2020-03-11 HISTORY — PX: BRONCHIAL BIOPSY: SHX5109

## 2020-03-11 LAB — GLUCOSE, CAPILLARY
Glucose-Capillary: 165 mg/dL — ABNORMAL HIGH (ref 70–99)
Glucose-Capillary: 129 mg/dL — ABNORMAL HIGH (ref 70–99)
Glucose-Capillary: 141 mg/dL — ABNORMAL HIGH (ref 70–99)

## 2020-03-11 LAB — COMPREHENSIVE METABOLIC PANEL
ALT: 18 U/L (ref 0–44)
AST: 18 U/L (ref 15–41)
Albumin: 3.6 g/dL (ref 3.5–5.0)
Alkaline Phosphatase: 107 U/L (ref 38–126)
Anion gap: 9 (ref 5–15)
BUN: 15 mg/dL (ref 6–20)
CO2: 21 mmol/L — ABNORMAL LOW (ref 22–32)
Calcium: 9 mg/dL (ref 8.9–10.3)
Chloride: 108 mmol/L (ref 98–111)
Creatinine, Ser: 1.01 mg/dL (ref 0.61–1.24)
GFR calc Af Amer: >60 mL/min (ref >60)
GFR calc non Af Amer: >60 mL/min (ref >60)
Glucose, Bld: 161 mg/dL — ABNORMAL HIGH (ref 70–99)
Potassium: 4.2 mmol/L (ref 3.5–5.1)
Sodium: 138 mmol/L (ref 135–145)
Total Bilirubin: 0.8 mg/dL (ref 0.3–1.2)
Total Protein: 7.1 g/dL (ref 6.5–8.1)

## 2020-03-11 LAB — CBC
HCT: 39.3 % (ref 39.0–52.0)
Hemoglobin: 12.5 g/dL — ABNORMAL LOW (ref 13.0–17.0)
MCH: 27.7 pg (ref 26.0–34.0)
MCHC: 31.8 g/dL (ref 30.0–36.0)
MCV: 86.9 fL (ref 80.0–100.0)
Platelets: 256 10*3/uL (ref 150–400)
RBC: 4.52 MIL/uL (ref 4.22–5.81)
RDW: 13.7 % (ref 11.5–15.5)
WBC: 9 10*3/uL (ref 4.0–10.5)
nRBC: 0 % (ref 0.0–0.2)

## 2020-03-11 LAB — PROTIME-INR
INR: 1.1 (ref 0.8–1.2)
Prothrombin Time: 14 seconds (ref 11.4–15.2)

## 2020-03-11 LAB — APTT: aPTT: 39 seconds — ABNORMAL HIGH (ref 24–36)

## 2020-03-11 SURGERY — BRONCHOSCOPY, WITH EBUS
Anesthesia: General

## 2020-03-11 MED ORDER — PHENYLEPHRINE 40 MCG/ML (10ML) SYRINGE FOR IV PUSH (FOR BLOOD PRESSURE SUPPORT)
PREFILLED_SYRINGE | INTRAVENOUS | Status: DC | PRN
  Administered 2020-03-11: 160 ug via INTRAVENOUS
  Administered 2020-03-11 (×2): 120 ug via INTRAVENOUS

## 2020-03-11 MED ORDER — PROPOFOL 10 MG/ML IV BOLUS
INTRAVENOUS | Status: DC | PRN
  Administered 2020-03-11: 160 mg via INTRAVENOUS

## 2020-03-11 MED ORDER — SODIUM CHLORIDE (PF) 0.9 % IJ SOLN
PREFILLED_SYRINGE | INTRAMUSCULAR | Status: DC | PRN
  Administered 2020-03-11: 2 mL
  Administered 2020-03-11: 6 mL

## 2020-03-11 MED ORDER — SUGAMMADEX SODIUM 200 MG/2ML IV SOLN
INTRAVENOUS | Status: DC | PRN
  Administered 2020-03-11: 200 mg via INTRAVENOUS

## 2020-03-11 MED ORDER — ACETAMINOPHEN 500 MG PO TABS
1000.0000 mg | ORAL_TABLET | Freq: Once | ORAL | Status: AC
  Administered 2020-03-11: 1000 mg via ORAL

## 2020-03-11 MED ORDER — SUCCINYLCHOLINE CHLORIDE 200 MG/10ML IV SOSY
PREFILLED_SYRINGE | INTRAVENOUS | Status: DC | PRN
  Administered 2020-03-11: 140 mg via INTRAVENOUS

## 2020-03-11 MED ORDER — VARENICLINE TARTRATE 1 MG PO TABS: 1.0000 mg | ORAL_TABLET | Freq: Two times a day (BID) | ORAL | 1 refills | Status: DC

## 2020-03-11 MED ORDER — CHANTIX STARTING MONTH PAK 0.5 MG X 11 & 1 MG X 42 PO TABS: ORAL_TABLET | ORAL | 0 refills | Status: DC

## 2020-03-11 MED ORDER — NICOTINE 7 MG/24HR TD PT24: 7.0000 mg | MEDICATED_PATCH | Freq: Every day | TRANSDERMAL | 0 refills | Status: DC

## 2020-03-11 MED ORDER — ONDANSETRON HCL 4 MG/2ML IJ SOLN
INTRAMUSCULAR | Status: DC | PRN
  Administered 2020-03-11: 4 mg via INTRAVENOUS

## 2020-03-11 MED ORDER — DEXAMETHASONE SODIUM PHOSPHATE 10 MG/ML IJ SOLN
INTRAMUSCULAR | Status: DC | PRN
  Administered 2020-03-11: 10 mg via INTRAVENOUS

## 2020-03-11 MED ORDER — ACETAMINOPHEN 500 MG PO TABS
ORAL_TABLET | ORAL | Status: AC
  Filled 2020-03-11: qty 2

## 2020-03-11 MED ORDER — LIDOCAINE 2% (20 MG/ML) 5 ML SYRINGE
INTRAMUSCULAR | Status: DC | PRN
  Administered 2020-03-11: 100 mg via INTRAVENOUS

## 2020-03-11 MED ORDER — FENTANYL CITRATE (PF) 100 MCG/2ML IJ SOLN
INTRAMUSCULAR | Status: DC | PRN
  Administered 2020-03-11 (×2): 25 ug via INTRAVENOUS
  Administered 2020-03-11: 50 ug via INTRAVENOUS

## 2020-03-11 MED ORDER — MIDAZOLAM HCL 5 MG/5ML IJ SOLN
INTRAMUSCULAR | Status: DC | PRN
  Administered 2020-03-11: 2 mg via INTRAVENOUS

## 2020-03-11 MED ORDER — EPHEDRINE SULFATE-NACL 50-0.9 MG/10ML-% IV SOSY: PREFILLED_SYRINGE | INTRAVENOUS | Status: DC | PRN

## 2020-03-11 MED ORDER — FENTANYL CITRATE (PF) 100 MCG/2ML IJ SOLN: 25.0000 ug | INTRAMUSCULAR | Status: DC | PRN

## 2020-03-11 MED ORDER — ROCURONIUM BROMIDE 100 MG/10ML IV SOLN
INTRAVENOUS | Status: DC | PRN
  Administered 2020-03-11: 50 mg via INTRAVENOUS
  Administered 2020-03-11 (×2): 10 mg via INTRAVENOUS

## 2020-03-11 MED ORDER — LACTATED RINGERS IV SOLN: INTRAVENOUS | Status: DC

## 2020-03-11 SURGICAL SUPPLY — 29 items
BRUSH CYTOL CELLEBRITY 1.5X140 (MISCELLANEOUS) IMPLANT
CANISTER SUCT 3000ML PPV (MISCELLANEOUS) ×3 IMPLANT
CONT SPEC 4OZ CLIKSEAL STRL BL (MISCELLANEOUS) ×3 IMPLANT
COVER BACK TABLE 60X90IN (DRAPES) ×3 IMPLANT
COVER DOME SNAP 22 D (MISCELLANEOUS) ×3 IMPLANT
FORCEPS BIOP RJ4 1.8 (CUTTING FORCEPS) IMPLANT
GAUZE SPONGE 4X4 12PLY STRL (GAUZE/BANDAGES/DRESSINGS) ×3 IMPLANT
GLOVE BIO SURGEON STRL SZ7.5 (GLOVE) ×3 IMPLANT
GOWN STRL REUS W/ TWL LRG LVL3 (GOWN DISPOSABLE) ×2 IMPLANT
GOWN STRL REUS W/TWL LRG LVL3 (GOWN DISPOSABLE) ×3
KIT CLEAN ENDO COMPLIANCE (KITS) ×6 IMPLANT
KIT TURNOVER KIT B (KITS) ×3 IMPLANT
MARKER SKIN DUAL TIP RULER LAB (MISCELLANEOUS) ×3 IMPLANT
NEEDLE EBUS SONO TIP PENTAX (NEEDLE) ×3 IMPLANT
NS IRRIG 1000ML POUR BTL (IV SOLUTION) ×3 IMPLANT
OIL SILICONE PENTAX (PARTS (SERVICE/REPAIRS)) ×3 IMPLANT
PAD ARMBOARD 7.5X6 YLW CONV (MISCELLANEOUS) ×6 IMPLANT
SOL ANTI FOG 6CC (MISCELLANEOUS) ×2 IMPLANT
SOLUTION ANTI FOG 6CC (MISCELLANEOUS) ×1
SYR 20CC LL (SYRINGE) ×6 IMPLANT
SYR 20ML ECCENTRIC (SYRINGE) ×6 IMPLANT
SYR 50ML SLIP (SYRINGE) IMPLANT
SYR 5ML LUER SLIP (SYRINGE) ×3 IMPLANT
TOWEL OR 17X24 6PK STRL BLUE (TOWEL DISPOSABLE) ×3 IMPLANT
TRAP SPECIMEN MUCOUS 40CC (MISCELLANEOUS) IMPLANT
TUBE CONNECTING 20X1/4 (TUBING) ×6 IMPLANT
UNDERPAD 30X30 (UNDERPADS AND DIAPERS) ×3 IMPLANT
VALVE DISPOSABLE (MISCELLANEOUS) ×3 IMPLANT
WATER STERILE IRR 1000ML POUR (IV SOLUTION) ×3 IMPLANT

## 2020-03-11 NOTE — Anesthesia Procedure Notes (Signed)
Procedure Name: Intubation Date/Time: 03/11/2020 2:57 PM Performed by: Candis Shine, CRNA Pre-anesthesia Checklist: Patient identified, Emergency Drugs available, Suction available and Patient being monitored Patient Re-evaluated:Patient Re-evaluated prior to induction Oxygen Delivery Method: Circle System Utilized Preoxygenation: Pre-oxygenation with 100% oxygen Induction Type: IV induction Ventilation: Mask ventilation without difficulty Laryngoscope Size: Mac and 4 Grade View: Grade II Tube type: Oral Tube size: 8.5 mm Number of attempts: 1 Airway Equipment and Method: Stylet Placement Confirmation: ETT inserted through vocal cords under direct vision,  positive ETCO2 and breath sounds checked- equal and bilateral Secured at: 24 cm Tube secured with: Tape Dental Injury: Teeth and Oropharynx as per pre-operative assessment

## 2020-03-11 NOTE — Anesthesia Postprocedure Evaluation (Signed)
Anesthesia Post Note  Patient: William Farrell  Procedure(s) Performed: VIDEO BRONCHOSCOPY WITH ENDOBRONCHIAL ULTRASOUND (N/A ) FINE NEEDLE ASPIRATION (FNA) LINEAR CRYOTHERAPY BRONCHIAL BIOPSIES     Patient location during evaluation: PACU Anesthesia Type: General Level of consciousness: awake and alert Pain management: pain level controlled Vital Signs Assessment: post-procedure vital signs reviewed and stable Respiratory status: spontaneous breathing, nonlabored ventilation, respiratory function stable and patient connected to nasal cannula oxygen Cardiovascular status: blood pressure returned to baseline and stable Postop Assessment: no apparent nausea or vomiting Anesthetic complications: no    Last Vitals:  Vitals:   03/11/20 1720 03/11/20 1721  BP:  107/69  Pulse: 74 75  Resp: 14 15  Temp:  (!) 36.3 C  SpO2: 93% 92%               Effie Berkshire

## 2020-03-11 NOTE — Transfer of Care (Signed)
Immediate Anesthesia Transfer of Care Note  Patient: William Farrell  Procedure(s) Performed: VIDEO BRONCHOSCOPY WITH ENDOBRONCHIAL ULTRASOUND (N/A ) FINE NEEDLE ASPIRATION (FNA) LINEAR CRYOTHERAPY BRONCHIAL BIOPSIES  Patient Location: PACU  Anesthesia Type:General  Level of Consciousness: awake, alert  and oriented  Airway & Oxygen Therapy: Patient Spontanous Breathing and Patient connected to face mask oxygen  Post-op Assessment: Report given to RN and Post -op Vital signs reviewed and stable  Post vital signs: Reviewed and stable  Last Vitals:  Vitals Value Taken Time  BP 110/58 03/11/20 1638  Temp    Pulse 71 03/11/20 1639  Resp 18 03/11/20 1639  SpO2 100 % 03/11/20 1639    Last Pain:  Vitals:   03/11/20 1358  TempSrc:   PainSc: 6       Patients Stated Pain Goal: 2 (54/65/03 [id number])  Complications: No apparent anesthesia complications

## 2020-03-11 NOTE — Discharge Instructions (Signed)
Flexible Bronchoscopy, Care After This sheet gives you information about how to care for yourself after your test. Your doctor may also give you more specific instructions. If you have problems or questions, contact your doctor. Follow these instructions at home: Eating and drinking  The day after the test, go back to your normal diet. Driving  Do not drive for 24 hours if you were given a medicine to help you relax (sedative).  Do not drive or use heavy machinery while taking prescription pain medicine. General instructions   Take over-the-counter and prescription medicines only as told by your doctor.  Return to your normal activities as told. Ask what activities are safe for you.  Do not use any products that have nicotine or tobacco in them. This includes cigarettes and e-cigarettes. If you need help quitting, ask your doctor.  Keep all follow-up visits as told by your doctor. This is important. It is very important if you had a tissue sample (biopsy) taken. Get help right away if:  You have shortness of breath that gets worse.  You get light-headed.  You feel like you are going to pass out (faint).  You have chest pain.  You cough up: ? More than a little blood. ? More blood than before. Summary  Do not eat or drink anything (not even water) for 2 hours after your test, or until your numbing medicine wears off.  Do not use cigarettes. Do not use e-cigarettes.  Get help right away if you have chest pain. This information is not intended to replace advice given to you by your health care provider. Make sure you discuss any questions you have with your health care provider. Document Revised: 10/14/2017 Document Reviewed: 11/19/2016 Elsevier Patient Education  2020 [employer].

## 2020-03-11 NOTE — Anesthesia Preprocedure Evaluation (Signed)
Anesthesia Evaluation  Patient identified by MRN, date of birth, ID band Patient awake    Reviewed: Allergy & Precautions, NPO status , Patient's Chart, lab work & pertinent test results  Airway Mallampati: II  TM Distance: >3 FB     Dental   Pulmonary asthma , Current Smoker and Patient abstained from smoking.,    breath sounds clear to auscultation       Cardiovascular negative cardio ROS   Rhythm:Regular Rate:Normal     Neuro/Psych    GI/Hepatic Neg liver ROS, GERD  ,  Endo/Other  diabetes  Renal/GU negative Renal ROS     Musculoskeletal   Abdominal   Peds  Hematology   Anesthesia Other Findings   Reproductive/Obstetrics                             Anesthesia Physical Anesthesia Plan  ASA: III  Anesthesia Plan: General   Post-op Pain Management:    Induction: Intravenous  PONV Risk Score and Plan: 2 and Ondansetron, Dexamethasone and Midazolam  Airway Management Planned: Oral ETT  Additional Equipment:   Intra-op Plan:   Post-operative Plan: Extubation in OR  Informed Consent: I have reviewed the patients History and Physical, chart, labs and discussed the procedure including the risks, benefits and alternatives for the proposed anesthesia with the patient or authorized representative who has indicated his/her understanding and acceptance.     Dental advisory given  Plan Discussed with: CRNA and Anesthesiologist  Anesthesia Plan Comments:         Anesthesia Quick Evaluation

## 2020-03-11 NOTE — Op Note (Addendum)
Video Bronchoscopy with Endobronchial Ultrasound, endobronchial cryobiopsies, endobronchial cryotherapy, tumor debulking and relief of RUL Stenosis Procedure Note  Date of Operation: 03/11/2020  Pre-op Diagnosis: Lung mass, mediastinal adenopathy  Post-op Diagnosis: RUL endobronchial tumor, Lung mass, mediastinal adenopathy  Surgeon: Garner Nash, DO   Assistants: None  Anesthesia: General endotracheal anesthesia  Operation: Flexible video fiberoptic bronchoscopy with endobronchial ultrasound and biopsies.  Estimated Blood Loss: Minimal, <0DT   Complications: None   Indications and History: William Farrell is a 59 y.o. male with lung mass, mediastinal adenopathy.  The risks, benefits, complications, treatment options and expected outcomes were discussed with the patient.  The possibilities of pneumothorax, pneumonia, reaction to medication, pulmonary aspiration, perforation of a viscus, bleeding, failure to diagnose a condition and creating a complication requiring transfusion or operation were discussed with the patient who freely signed the consent.    Description of Procedure: The patient was examined in the preoperative area and history and data from the preprocedure consultation were reviewed. It was deemed appropriate to proceed.  The patient was taken to [facility] endoscopy room 2, identified as [name] and the procedure verified as Flexible Video Fiberoptic Bronchoscopy.  A Time Out was held and the above information confirmed. After being taken to the operating room general anesthesia was initiated and the patient  was orally intubated. The video fiberoptic bronchoscope was introduced via the endotracheal tube and a general inspection was performed which showed normal left lung anatomy with right lung anatomy revealing near-total occlusion of the right upper lobe with infiltrating tumor and adherent clot.. The standard scope was then withdrawn and the endobronchial  ultrasound was used to identify and characterize the peritracheal, hilar and bronchial lymph nodes. Inspection showed enlarged paratracheal as well as subcarinal adenopathy. Using real-time ultrasound guidance Wang needle biopsies were take from Station 7 nodes and were sent for cytology.  Following the portion of endobronchial ultrasound we switched to the standard viewing Olympus bronchoscope.  Using the therapeutic bronchoscope we used 2.0 mm [employer] forceps for biopsy and excision of the right upper lobe endobronchial mass.  Once the anterior segment was identified as patent and not occluded with tumor we inserted to the 1.7 mm erbe cryotherapy probe.  Using the cryotherapy probe we completed right upper lobe endobronchial tumor biopsies and removal/excision of right upper lobe endobronchial mass.  We used the cryotherapy probe to treat in freeze thaw cycles of tumor along the superior rim of the opening of the right upper lobe as well as the medial aspect of the anterior segment.  This was an effort to help maintain patency of the right upper lobe anterior segment.  Under Hydro dissection and saline stenting we were able to visualize opening of the right upper lobe anterior segment.  There was a small appearing pinhole sized opening to the posterior segment and the apical septic treatment was not identified.  Due to the significant amount of tumor debridement and how large the tumor was invading as well as evidence of necrosis we were only able to excise and trim back tumor along the anterior segment and then the opening of the right upper lobe.  We were not able to obtain patency of the apical and posterior segment this was not identified.  This was used in an effort for tumor debulking and relief of right upper lobe stenosis.  All visible tumor within the airway was treated with freeze thaw cycles of cryotherapy.  The cryotherapy probe was removed and the therapeutic bronchoscope  was used for  suctioning and clearance of remaining debris, tumor, secretions and blood clots. The patient tolerated the procedure well without apparent complications. There was no significant blood loss. The bronchoscope was withdrawn. Anesthesia was reversed and the patient was taken to the PACU for recovery.   Samples: 1. Wang needle biopsies from station 7 node 2.  Endobronchial forcep biopsies of the right upper lobe 3.  Endobronchial cryotherapy biopsies of the right upper lobe  Plans:  The patient will be discharged from the PACU to home when recovered from anesthesia. We will review the cytology, pathology and microbiology results with the patient when they become available. Outpatient followup will be with Garner Nash, DO.    Garner Nash, DO [facility] 03/11/2020 4:28 PM

## 2020-03-11 NOTE — Telephone Encounter (Signed)
Pt left a vm msg yesterday requesting new rxs for chantix and nicotine patches for smoke cessation. Rxs to be sent to [facility] in [place]. Rxs pended for review.

## 2020-03-11 NOTE — Interval H&P Note (Signed)
History and Physical Interval Note:  03/11/2020 2:57 PM  William Farrell  has presented today for surgery, with the diagnosis of LUNG MASS.  The various methods of treatment have been discussed with the patient and family. After consideration of risks, benefits and other options for treatment, the patient has consented to  Procedure(s): [place] (N/A) as a surgical intervention.  The patient's history has been reviewed, patient examined, no change in status, stable for surgery.  I have reviewed the patient's chart and labs.  Questions were answered to the patient's satisfaction.    Patient seen in pre-op. All questions answered. No barriers to proceed.   [place]

## 2020-03-11 NOTE — Telephone Encounter (Signed)
Spoke with pt. He is aware that he needs to contact medical records for this request.

## 2020-03-12 ENCOUNTER — Other Ambulatory Visit: Payer: Self-pay | Admitting: Medical Oncology

## 2020-03-12 ENCOUNTER — Telehealth: Payer: Self-pay | Admitting: *Deleted

## 2020-03-12 NOTE — Telephone Encounter (Signed)
Oncology Nurse Navigator Documentation  Oncology Nurse Navigator Flowsheets 03/12/2020  Abnormal Finding Date 03/12/2020  Navigator Location [facility]  Referral Date to RadOnc/MedOnc 03/12/2020  Navigator Encounter Type Telephone/I received referral from Dr. Valeta Harms.  I updated Dr. Julien Nordmann and he is able to see patient tomorrow.  I called patient and he was unkind and lashing out on the phone.  He states he does not want to be "cut" on.  He wants to be seen at [facility] and how they are the best cancer center around.  I listened as he explained.  I explained that if he did not want to come to contact Dr. Valeta Harms.  He agreed to come and I gave him the address.  Dr. Julien Nordmann updated on his response from the phone call.    Telephone Outgoing Call  Treatment Phase Abnormal Scans  Barriers/Navigation Needs Coordination of Care;Education  Education Other  Interventions Coordination of Care;Education  Acuity Level 2-Minimal Needs (1-2 Barriers Identified)  Coordination of Care Appts  Education Method Verbal  Time Spent with Patient 30

## 2020-03-13 ENCOUNTER — Other Ambulatory Visit: Payer: Self-pay

## 2020-03-13 ENCOUNTER — Encounter: Payer: Self-pay | Admitting: *Deleted

## 2020-03-13 ENCOUNTER — Encounter: Payer: Self-pay | Admitting: Internal Medicine

## 2020-03-13 ENCOUNTER — Inpatient Hospital Stay: Payer: BC Managed Care – PPO

## 2020-03-13 ENCOUNTER — Inpatient Hospital Stay: Payer: BC Managed Care – PPO | Attending: Internal Medicine | Admitting: Internal Medicine

## 2020-03-13 DIAGNOSIS — Z5111 Encounter for antineoplastic chemotherapy: Secondary | ICD-10-CM

## 2020-03-13 DIAGNOSIS — C3490 Malignant neoplasm of unspecified part of unspecified bronchus or lung: Secondary | ICD-10-CM | POA: Diagnosis not present

## 2020-03-13 DIAGNOSIS — Z7189 Other specified counseling: Secondary | ICD-10-CM

## 2020-03-13 DIAGNOSIS — C3491 Malignant neoplasm of unspecified part of right bronchus or lung: Secondary | ICD-10-CM | POA: Diagnosis not present

## 2020-03-13 DIAGNOSIS — C3411 Malignant neoplasm of upper lobe, right bronchus or lung: Secondary | ICD-10-CM | POA: Insufficient documentation

## 2020-03-13 LAB — SURGICAL PATHOLOGY

## 2020-03-13 LAB — CYTOLOGY - NON PAP

## 2020-03-13 NOTE — Progress Notes (Signed)
Per Dr. Julien Nordmann, I requested pathology dept to send tissue for PDL 1 testing.

## 2020-03-13 NOTE — Progress Notes (Signed)
START ON PATHWAY REGIMEN - Non-Small Cell Lung     Administer weekly:     Paclitaxel      Carboplatin   **Always confirm dose/schedule in your pharmacy ordering system**  Patient Characteristics: Preoperative or Nonsurgical Candidate (Clinical Staging), Stage III - Nonsurgical Candidate (Nonsquamous and Squamous), PS = 0, 1 Therapeutic Status: Preoperative or Nonsurgical Candidate (Clinical Staging) AJCC T Category: cT2a AJCC N Category: cN2 AJCC M Category: cM0 AJCC 8 Stage Grouping: IIIA ECOG Performance Status: 0 Intent of Therapy: Curative Intent, Discussed with Patient

## 2020-03-13 NOTE — Progress Notes (Signed)
  [facility] [phone]   [phone]  CONSULT NOTE  REFERRING PHYSICIAN: Dr. Bradley Icard  REASON FOR CONSULTATION:  59 years old white male recently diagnosed with lung cancer.  HPI William Farrell is a 59 y.o. male with past medical history significant for diabetes mellitus, diverticulitis, GERD, dyslipidemia and kidney stone as well as long history of heavy smoking.  The patient had CT screening of the lung performed recently based on the advice of his wife who had a previous scan because of the long history of smoking.  He had the CT screening of the chest performed on 03/05/2020 and it showed masslike opacity in the medial right upper lobe/suprahilar region difficult to discretely measure but approximately around 3.8 x 2.9 cm.  There was associated soft tissue narrowing/increasing the right upper lobe bronchus with nodular opacity extending posteriorly to the right upper lobe along the major fissure measuring 1.62 cm.  The scan also showed mediastinal lymphadenopathy including 1.6 cm short axis right paratracheal node and 1.4 cm short axis subcarinal node. The patient was seen by Dr. Icard and on 03/11/2020 he underwent flexible video fiberoptic bronchoscopy with endobronchial ultrasound and biopsies. The final pathology ([id number]) was consistent with squamous cell carcinoma.  We will request the tissue block to be sent for PD-L1 expression. The patient was referred to me today for evaluation and recommendation regarding treatment of his condition.  He is scheduled to have a PET scan tomorrow. When seen today he continues to have cough and soreness in the chest after the procedure.  He also has shortness of breath with exertion but no significant hemoptysis.  He denied having any weight loss or night sweats.  He has occasional headache.  He has no nausea, vomiting, diarrhea or constipation. Family history significant for mother and brother with a  stroke.  Father had cancer and half brother with suspicious thymoma. The patient is married and has no children.  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  He has a history of smoking up to 2 pack/day for around 43 years and he currently smokes less than 1 pack/day and on Chantix to quit smoking.  He has a history of alcohol abuse in the past but not recently and no history of drug abuse.  HPI  Past Medical History:  Diagnosis Date  . Asthma    as a child  . Diabetes (HCC)   . Diverticulitis   . GERD (gastroesophageal reflux disease)   . High cholesterol   . History of kidney stones     Past Surgical History:  Procedure Laterality Date  . broken bone repair    . COLONOSCOPY    . KNEE ARTHROSCOPY WITH ANTERIOR CRUCIATE LIGAMENT (ACL) REPAIR     x 2    Family History  Problem Relation Age of Onset  . High blood pressure Mother   . Stroke Mother     Social History Social History   Tobacco Use  . Smoking status: Current Every Day Smoker    Packs/day: 2.00    Years: 43.00    Pack years: 86.00    Types: Cigarettes  . Smokeless tobacco: Never Used  . Tobacco comment: currently smoking 15cigs daily as of 03/06/20  Substance Use Topics  . Alcohol use: Not Currently  . Drug use: Not Currently    Allergies  Allergen Reactions  . Sitagliptin Other (See Comments)    headache    Current   Outpatient Medications  Medication Sig Dispense Refill  . acetaminophen (TYLENOL) 500 MG tablet Take 1,000 mg by mouth every 6 (six) hours as needed for moderate pain or headache.    . albuterol (VENTOLIN HFA) 108 (90 Base) MCG/ACT inhaler Inhale 2 puffs into the lungs every 6 (six) hours as needed for wheezing or shortness of breath.     . aspirin EC 81 MG tablet Take 81 mg by mouth daily.    . atorvastatin (LIPITOR) 40 MG tablet Take 1 tablet (40 mg total) by mouth daily. 90 tablet 3  . calcium carbonate (TUMS - DOSED IN MG  ELEMENTAL CALCIUM) 500 MG chewable tablet Chew 2 tablets by mouth daily as needed for indigestion or heartburn.    . glipiZIDE (GLUCOTROL) 5 MG tablet Take 1 tablet (5 mg total) by mouth daily before breakfast. 90 tablet 3  . glucose blood test strip Use as instructed up to qid 100 each 99  . meloxicam (MOBIC) 15 MG tablet Take 1 tablet (15 mg total) by mouth daily. 90 tablet 3  . metFORMIN (GLUCOPHAGE) 1000 MG tablet Take 1 tablet (1,000 mg total) by mouth 2 (two) times daily with a meal. 180 tablet 3  . nicotine (NICODERM CQ - DOSED IN MG/24 HR) 7 mg/24hr patch Place 1 patch (7 mg total) onto the skin daily. 28 patch 0  . trolamine salicylate (ASPERCREME) 10 % cream Apply 1 application topically as needed for muscle pain.    . varenicline (CHANTIX STARTING MONTH PAK) 0.5 MG X 11 & 1 MG X 42 tablet Take one 0.5 mg tablet by mouth once daily for 3 days, then increase to one 0.5 mg tablet twice daily for 4 days, then increase to one 1 mg tablet twice daily. 53 tablet 0  . OneTouch Delica Lancets 33G MISC As directed up to qid (Patient not taking: Reported on 03/13/2020) 100 each 99  . varenicline (CHANTIX) 1 MG tablet Take 1 tablet (1 mg total) by mouth 2 (two) times daily. (Patient not taking: Reported on 03/13/2020) 180 tablet 1   No current facility-administered medications for this visit.    Review of Systems  Constitutional: negative Eyes: negative Ears, nose, mouth, throat, and face: negative Respiratory: positive for cough and dyspnea on exertion Cardiovascular: negative Gastrointestinal: negative Genitourinary:negative Integument/breast: negative Hematologic/lymphatic: negative Musculoskeletal:negative Neurological: negative Behavioral/Psych: negative Endocrine: negative Allergic/Immunologic: negative  Physical Exam  RAL:alert, healthy, no distress, well nourished, well developed and anxious SKIN: skin color, texture, turgor are normal, no rashes or significant lesions HEAD:  Normocephalic, No masses, lesions, tenderness or abnormalities EYES: normal, PERRLA, Conjunctiva are pink and non-injected EARS: External ears normal, Canals clear OROPHARYNX:no exudate, no erythema and lips, buccal mucosa, and tongue normal  NECK: supple, no adenopathy, no JVD LYMPH:  no palpable lymphadenopathy, no hepatosplenomegaly LUNGS: clear to auscultation , and palpation HEART: regular rate & rhythm, no murmurs and no gallops ABDOMEN:abdomen soft, non-tender, normal bowel sounds and no masses or organomegaly BACK: No CVA tenderness, Range of motion is normal EXTREMITIES:no joint deformities, effusion, or inflammation, no edema  NEURO: alert & oriented x 3 with fluent speech, no focal motor/sensory deficits  PERFORMANCE STATUS: ECOG 1  LABORATORY DATA: Lab Results  Component Value Date   WBC 9.0 03/11/2020   HGB 12.5 (L) 03/11/2020   HCT 39.3 03/11/2020   MCV 86.9 03/11/2020   PLT 256 03/11/2020      Chemistry      Component Value Date/Time   NA 138 03/11/2020 1045     K 4.2 03/11/2020 1045   CL 108 03/11/2020 1045   CO2 21 (L) 03/11/2020 1045   BUN 15 03/11/2020 1045   CREATININE 1.01 03/11/2020 1045   CREATININE 1.10 01/14/2020 0859      Component Value Date/Time   CALCIUM 9.0 03/11/2020 1045   ALKPHOS 107 03/11/2020 1045   AST 18 03/11/2020 1045   ALT 18 03/11/2020 1045   BILITOT 0.8 03/11/2020 1045       RADIOGRAPHIC STUDIES: CT CHEST LUNG CA SCREEN LOW DOSE W/O CM  Result Date: 03/05/2020 CLINICAL DATA:  59 year old male current smoker, with 45 pack-year history of smoking, for initial lung cancer screening EXAM: CT CHEST WITHOUT CONTRAST LOW-DOSE FOR LUNG CANCER SCREENING TECHNIQUE: Multidetector CT imaging of the chest was performed following the standard protocol without IV contrast. COMPARISON:  None. FINDINGS: Cardiovascular: Heart is normal in size.  No pericardial effusion. No evidence of thoracic aortic aneurysm. Very mild coronary atherosclerosis  in the LAD. Mediastinum/Nodes: Mediastinal lymphadenopathy, including a 16 mm short axis right paratracheal node (series 2/image 24) and a 14 mm short axis subcarinal node (series 2/image 36). Visualized thyroid is unremarkable. Lungs/Pleura: Biapical pleural-parenchymal scarring. Masslike opacity in the medial right upper lobe/suprahilar region (series 4/image 146), difficult to discretely measure, approximately 3.8 x 2.9 cm (series 4/image 146). Associated soft tissue narrowing/encasing the right upper lobe bronchus (series 2/image 33). Nodular opacity extending posteriorly to the right upper lobe along the major fissure (series 4/image 161), measuring 16.2 mm. Mild centrilobular and paraseptal emphysematous changes, upper lung predominant. No pleural effusion or pneumothorax. Upper Abdomen: Visualized upper abdomen is grossly unremarkable. Musculoskeletal: Visualized osseous structures are within normal limits. IMPRESSION: Lung-RADS 4B, suspicious. Additional imaging evaluation or consultation with Pulmonology or Thoracic Surgery recommended. Masslike opacity in the medial right upper lobe/suprahilar region, measuring approximately 3.8 cm highly suspicious for primary bronchogenic neoplasm. Associated mediastinal lymphadenopathy, suspicious for nodal metastases. Discussion at multidisciplinary tumor board is suggested. Consider bronchoscopy and/or PET-CT, as clinically warranted. These results will be called to the ordering clinician or representative by the Radiologist Assistant, and communication documented in the PACS or [employer]. Aortic Atherosclerosis (ICD10-I70.0) and Emphysema (ICD10-J43.9). Electronically Signed   By: Julian Hy M.D.   On: 03/05/2020 15:35    ASSESSMENT: This is a 59 years old white male recently diagnosed with stage IIIa (T2a, N2, M0) non-small cell lung cancer, squamous cell carcinoma diagnosed in April 2021, presented with right upper lobe/suprahilar lung mass in  addition to right paratracheal and subcarinal lymphadenopathy.  This is pending additional staging work-up to rule out metastatic disease.   PLAN: I had a lengthy discussion with the patient and his wife today about his current disease stage, prognosis and treatment options. I personally and independently reviewed the scan images and discussed the result and showed the images to the patient and his wife. I recommended for the patient to complete the staging work-up and he is scheduled to have a PET scan tomorrow.  I will also order MRI of the brain to rule out brain metastasis. I will request the tissue block to be sent for PD-L1 expression. If he has no evidence of metastatic disease on the upcoming imaging studies, I recommended for the patient a course of concurrent chemoradiation with weekly carboplatin for AUC of 2 and paclitaxel 45 NG/M2 for 6-7 weeks.  This will be followed by consolidation immunotherapy with Imfinzi if the patient has no evidence for disease progression after the induction phase. I discussed with the patient  the adverse effect of this treatment including but not limited to alopecia, myelosuppression, nausea and vomiting, peripheral neuropathy, liver or renal dysfunction. I will refer the patient to radiation oncology. He is expected to start the first cycle of this treatment on Mar 24, 2020. The patient also mentions that he is interested in a second opinion at [facility] and he already has planned to travel there for the visit. If needed we can delay the start of his treatment by 1 more week until he has his visit done at [facility]. We will arrange for the patient to have a chemotherapy education class before the first dose of his treatment. I strongly encouraged the patient to quit smoking and he is currently on Chantix. He will come back for follow-up visit with the start of the first cycle of his treatment. He was advised to call immediately if he has any concerning  symptoms in the interval. The patient voices understanding of current disease status and treatment options and is in agreement with the current care plan.  All questions were answered. The patient knows to call the clinic with any problems, questions or concerns. We can certainly see the patient much sooner if necessary.  Thank you so much for allowing me to participate in the care of William Farrell. I will continue to follow up the patient with you and assist in his care.  The total time spent in the appointment was 90 minutes.  Disclaimer: This note was dictated with voice recognition software. Similar sounding words can inadvertently be transcribed and may not be corrected upon review.   William Farrell March 13, 2020, 3:26 PM   

## 2020-03-13 NOTE — Patient Instructions (Addendum)
Steps to Quit Smoking Smoking tobacco is the leading cause of preventable death. It can affect almost every organ in the body. Smoking puts you and people around you at risk for many serious, long-lasting (chronic) diseases. Quitting smoking can be hard, but it is one of the best things that you can do for your health. It is never too late to quit. How do I get ready to quit? When you decide to quit smoking, make a plan to help you succeed. Before you quit:  Pick a date to quit. Set a date within the next 2 weeks to give you time to prepare.  Write down the reasons why you are quitting. Keep this list in places where you will see it often.  Tell your family, friends, and co-workers that you are quitting. Their support is important.  Talk with your doctor about the choices that may help you quit.  Find out if your health insurance will pay for these treatments.  Know the people, places, things, and activities that make you want to smoke (triggers). Avoid them. What first steps can I take to quit smoking?  Throw away all cigarettes at home, at work, and in your car.  Throw away the things that you use when you smoke, such as ashtrays and lighters.  Clean your car. Make sure to empty the ashtray.  Clean your home, including curtains and carpets. What can I do to help me quit smoking? Talk with your doctor about taking medicines and seeing a counselor at the same time. You are more likely to succeed when you do both.  If you are pregnant or breastfeeding, talk with your doctor about counseling or other ways to quit smoking. Do not take medicine to help you quit smoking unless your doctor tells you to do so. To quit smoking: Quit right away  Quit smoking totally, instead of slowly cutting back on how much you smoke over a period of time.  Go to counseling. You are more likely to quit if you go to counseling sessions regularly. Take medicine You may take medicines to help you quit. Some  medicines need a prescription, and some you can buy over-the-counter. Some medicines may contain a drug called nicotine to replace the nicotine in cigarettes. Medicines may:  Help you to stop having the desire to smoke (cravings).  Help to stop the problems that come when you stop smoking (withdrawal symptoms). Your doctor may ask you to use:  Nicotine patches, gum, or lozenges.  Nicotine inhalers or sprays.  Non-nicotine medicine that is taken by mouth. Find resources Find resources and other ways to help you quit smoking and remain smoke-free after you quit. These resources are most helpful when you use them often. They include:  Online chats with a Social worker.  Phone quitlines.  Printed Furniture conservator/restorer.  Support groups or group counseling.  Text messaging programs.  Mobile phone apps. Use apps on your mobile phone or tablet that can help you stick to your quit plan. There are many free apps for mobile phones and tablets as well as websites. Examples include Quit Guide from the [employer] and smokefree.gov  What things can I do to make it easier to quit?   Talk to your family and friends. Ask them to support and encourage you.  Call a phone quitline (1-800-QUIT-NOW), reach out to support groups, or work with a Social worker.  Ask people who smoke to not smoke around you.  Avoid places that make you want to smoke,  such as: ? Bars. ? Parties. ? Smoke-break areas at work.  Spend time with people who do not smoke.  Lower the stress in your life. Stress can make you want to smoke. Try these things to help your stress: ? Getting regular exercise. ? Doing deep-breathing exercises. ? Doing yoga. ? Meditating. ? Doing a body scan. To do this, close your eyes, focus on one area of your body at a time from head to toe. Notice which parts of your body are tense. Try to relax the muscles in those areas. How will I feel when I quit smoking? Day 1 to 3 weeks Within the first 24 hours,  you may start to have some problems that come from quitting tobacco. These problems are very bad 2-3 days after you quit, but they do not often last for more than 2-3 weeks. You may get these symptoms:  Mood swings.  Feeling restless, nervous, angry, or annoyed.  Trouble concentrating.  Dizziness.  Strong desire for high-sugar foods and nicotine.  Weight gain.  Trouble pooping (constipation).  Feeling like you may vomit (nausea).  Coughing or a sore throat.  Changes in how the medicines that you take for other issues work in your body.  Depression.  Trouble sleeping (insomnia). Week 3 and afterward After the first 2-3 weeks of quitting, you may start to notice more positive results, such as:  Better sense of smell and taste.  Less coughing and sore throat.  Slower heart rate.  Lower blood pressure.  Clearer skin.  Better breathing.  Fewer sick days. Quitting smoking can be hard. Do not give up if you fail the first time. Some people need to try a few times before they succeed. Do your best to stick to your quit plan, and talk with your doctor if you have any questions or concerns. Summary  Smoking tobacco is the leading cause of preventable death. Quitting smoking can be hard, but it is one of the best things that you can do for your health.  When you decide to quit smoking, make a plan to help you succeed.  Quit smoking right away, not slowly over a period of time.  When you start quitting, seek help from your doctor, family, or friends. This information is not intended to replace advice given to you by your health care provider. Make sure you discuss any questions you have with your health care provider. Document Revised: 07/27/2019 Document Reviewed: 01/20/2019 Elsevier Patient Education  [place]. Paclitaxel injection What is this medicine? PACLITAXEL (PAK li TAX el) is a chemotherapy drug. It targets fast dividing cells, like cancer cells, and  causes these cells to die. This medicine is used to treat ovarian cancer, breast cancer, lung cancer, Kaposi's sarcoma, and other cancers. This medicine may be used for other purposes; ask your health care provider or pharmacist if you have questions. COMMON BRAND NAME(S): Onxol, Taxol What should I tell my health care provider before I take this medicine? They need to know if you have any of these conditions:  history of irregular heartbeat  liver disease  low blood counts, like low white cell, platelet, or red cell counts  lung or breathing disease, like asthma  tingling of the fingers or toes, or other nerve disorder  an unusual or allergic reaction to paclitaxel, alcohol, polyoxyethylated castor oil, other chemotherapy, other medicines, foods, dyes, or preservatives  pregnant or trying to get pregnant  breast-feeding How should I use this medicine? This drug is  given as an infusion into a vein. It is administered in a hospital or clinic by a specially trained health care professional. Talk to your pediatrician regarding the use of this medicine in children. Special care may be needed. Overdosage: If you think you have taken too much of this medicine contact a poison control center or emergency room at once. NOTE: This medicine is only for you. Do not share this medicine with others. What if I miss a dose? It is important not to miss your dose. Call your doctor or health care professional if you are unable to keep an appointment. What may interact with this medicine? Do not take this medicine with any of the following medications:  disulfiram  metronidazole This medicine may also interact with the following medications:  antiviral medicines for hepatitis, HIV or AIDS  certain antibiotics like erythromycin and clarithromycin  certain medicines for fungal infections like ketoconazole and itraconazole  certain medicines for seizures like carbamazepine, phenobarbital,  phenytoin  gemfibrozil  nefazodone  rifampin  St. John's wort This list may not describe all possible interactions. Give your health care provider a list of all the medicines, herbs, non-prescription drugs, or dietary supplements you use. Also tell them if you smoke, drink alcohol, or use illegal drugs. Some items may interact with your medicine. What should I watch for while using this medicine? Your condition will be monitored carefully while you are receiving this medicine. You will need important blood work done while you are taking this medicine. This medicine can cause serious allergic reactions. To reduce your risk you will need to take other medicine(s) before treatment with this medicine. If you experience allergic reactions like skin rash, itching or hives, swelling of the face, lips, or tongue, tell your doctor or health care professional right away. In some cases, you may be given additional medicines to help with side effects. Follow all directions for their use. This drug may make you feel generally unwell. This is not uncommon, as chemotherapy can affect healthy cells as well as cancer cells. Report any side effects. Continue your course of treatment even though you feel ill unless your doctor tells you to stop. Call your doctor or health care professional for advice if you get a fever, chills or sore throat, or other symptoms of a cold or flu. Do not treat yourself. This drug decreases your body's ability to fight infections. Try to avoid being around people who are sick. This medicine may increase your risk to bruise or bleed. Call your doctor or health care professional if you notice any unusual bleeding. Be careful brushing and flossing your teeth or using a toothpick because you may get an infection or bleed more easily. If you have any dental work done, tell your dentist you are receiving this medicine. Avoid taking products that contain aspirin, acetaminophen, ibuprofen,  naproxen, or ketoprofen unless instructed by your doctor. These medicines may hide a fever. Do not become pregnant while taking this medicine. Women should inform their doctor if they wish to become pregnant or think they might be pregnant. There is a potential for serious side effects to an unborn child. Talk to your health care professional or pharmacist for more information. Do not breast-feed an infant while taking this medicine. Men are advised not to father a child while receiving this medicine. This product may contain alcohol. Ask your pharmacist or healthcare provider if this medicine contains alcohol. Be sure to tell all healthcare providers you are taking this medicine.  Certain medicines, like metronidazole and disulfiram, can cause an unpleasant reaction when taken with alcohol. The reaction includes flushing, headache, nausea, vomiting, sweating, and increased thirst. The reaction can last from 30 minutes to several hours. What side effects may I notice from receiving this medicine? Side effects that you should report to your doctor or health care professional as soon as possible:  allergic reactions like skin rash, itching or hives, swelling of the face, lips, or tongue  breathing problems  changes in vision  fast, irregular heartbeat  high or low blood pressure  mouth sores  pain, tingling, numbness in the hands or feet  signs of decreased platelets or bleeding - bruising, pinpoint red spots on the skin, black, tarry stools, blood in the urine  signs of decreased red blood cells - unusually weak or tired, feeling faint or lightheaded, falls  signs of infection - fever or chills, cough, sore throat, pain or difficulty passing urine  signs and symptoms of liver injury like dark yellow or brown urine; general ill feeling or flu-like symptoms; light-colored stools; loss of appetite; nausea; right upper belly pain; unusually weak or tired; yellowing of the eyes or  skin  swelling of the ankles, feet, hands  unusually slow heartbeat Side effects that usually do not require medical attention (report to your doctor or health care professional if they continue or are bothersome):  diarrhea  hair loss  loss of appetite  muscle or joint pain  nausea, vomiting  pain, redness, or irritation at site where injected  tiredness This list may not describe all possible side effects. Call your doctor for medical advice about side effects. You may report side effects to FDA at 1-800-FDA-1088. Where should I keep my medicine? This drug is given in a hospital or clinic and will not be stored at home. NOTE: This sheet is a summary. It may not cover all possible information. If you have questions about this medicine, talk to your doctor, pharmacist, or health care provider.  2020 Elsevier/Gold Standard (2017-07-05 13:14:55) Carboplatin injection What is this medicine? CARBOPLATIN (KAR boe pla tin) is a chemotherapy drug. It targets fast dividing cells, like cancer cells, and causes these cells to die. This medicine is used to treat ovarian cancer and many other cancers. This medicine may be used for other purposes; ask your health care provider or pharmacist if you have questions. COMMON BRAND NAME(S): Paraplatin What should I tell my health care provider before I take this medicine? They need to know if you have any of these conditions:  blood disorders  hearing problems  kidney disease  recent or ongoing radiation therapy  an unusual or allergic reaction to carboplatin, cisplatin, other chemotherapy, other medicines, foods, dyes, or preservatives  pregnant or trying to get pregnant  breast-feeding How should I use this medicine? This drug is usually given as an infusion into a vein. It is administered in a hospital or clinic by a specially trained health care professional. Talk to your pediatrician regarding the use of this medicine in children.  Special care may be needed. Overdosage: If you think you have taken too much of this medicine contact a poison control center or emergency room at once. NOTE: This medicine is only for you. Do not share this medicine with others. What if I miss a dose? It is important not to miss a dose. Call your doctor or health care professional if you are unable to keep an appointment. What may interact with this medicine?  medicines for seizures  medicines to increase blood counts like filgrastim, pegfilgrastim, sargramostim  some antibiotics like amikacin, gentamicin, neomycin, streptomycin, tobramycin  vaccines Talk to your doctor or health care professional before taking any of these medicines:  acetaminophen  aspirin  ibuprofen  ketoprofen  naproxen This list may not describe all possible interactions. Give your health care provider a list of all the medicines, herbs, non-prescription drugs, or dietary supplements you use. Also tell them if you smoke, drink alcohol, or use illegal drugs. Some items may interact with your medicine. What should I watch for while using this medicine? Your condition will be monitored carefully while you are receiving this medicine. You will need important blood work done while you are taking this medicine. This drug may make you feel generally unwell. This is not uncommon, as chemotherapy can affect healthy cells as well as cancer cells. Report any side effects. Continue your course of treatment even though you feel ill unless your doctor tells you to stop. In some cases, you may be given additional medicines to help with side effects. Follow all directions for their use. Call your doctor or health care professional for advice if you get a fever, chills or sore throat, or other symptoms of a cold or flu. Do not treat yourself. This drug decreases your body's ability to fight infections. Try to avoid being around people who are sick. This medicine may increase your  risk to bruise or bleed. Call your doctor or health care professional if you notice any unusual bleeding. Be careful brushing and flossing your teeth or using a toothpick because you may get an infection or bleed more easily. If you have any dental work done, tell your dentist you are receiving this medicine. Avoid taking products that contain aspirin, acetaminophen, ibuprofen, naproxen, or ketoprofen unless instructed by your doctor. These medicines may hide a fever. Do not become pregnant while taking this medicine. Women should inform their doctor if they wish to become pregnant or think they might be pregnant. There is a potential for serious side effects to an unborn child. Talk to your health care professional or pharmacist for more information. Do not breast-feed an infant while taking this medicine. What side effects may I notice from receiving this medicine? Side effects that you should report to your doctor or health care professional as soon as possible:  allergic reactions like skin rash, itching or hives, swelling of the face, lips, or tongue  signs of infection - fever or chills, cough, sore throat, pain or difficulty passing urine  signs of decreased platelets or bleeding - bruising, pinpoint red spots on the skin, black, tarry stools, nosebleeds  signs of decreased red blood cells - unusually weak or tired, fainting spells, lightheadedness  breathing problems  changes in hearing  changes in vision  chest pain  high blood pressure  low blood counts - This drug may decrease the number of white blood cells, red blood cells and platelets. You may be at increased risk for infections and bleeding.  nausea and vomiting  pain, swelling, redness or irritation at the injection site  pain, tingling, numbness in the hands or feet  problems with balance, talking, walking  trouble passing urine or change in the amount of urine Side effects that usually do not require medical  attention (report to your doctor or health care professional if they continue or are bothersome):  hair loss  loss of appetite  metallic taste in the mouth or changes in  taste This list may not describe all possible side effects. Call your doctor for medical advice about side effects. You may report side effects to FDA at 1-800-FDA-1088. Where should I keep my medicine? This drug is given in a hospital or clinic and will not be stored at home. NOTE: This sheet is a summary. It may not cover all possible information. If you have questions about this medicine, talk to your doctor, pharmacist, or health care provider.  2020 Elsevier/Gold Standard (2008-02-06 14:38:05)  Lung Cancer Lung cancer is an abnormal growth of cancerous cells that forms a mass (malignant tumor) in a lung. There are several types of lung cancer. The types are based on the appearance of the tumor cells. The two most common types are:  Non-small cell lung cancer. This type of lung cancer is the most common type. Non-small cell lung cancers include squamous cell carcinoma, adenocarcinoma, and large cell carcinoma.  Small cell lung cancer. In this type of lung cancer, abnormal cells are smaller than those of non-small cell lung cancer. Small cell lung cancer gets worse (progresses) faster than non-small cell lung cancer. What are the causes? The most common cause of lung cancer is smoking tobacco. The second most common cause is exposure to a chemical called radon. What increases the risk? You are more likely to develop this condition if:  You smoke tobacco.  You have been exposed to: ? Secondhand tobacco smoke. ? Radon gas. ? Uranium. ? Asbestos. ? Arsenic in drinking water. ? Air pollution.  You have a family or personal history of lung cancer.  You have had lung radiation therapy in the past.  You are older than age 55. What are the signs or symptoms? In the early stages, you may not have any symptoms. As  the cancer progresses, symptoms may include:  A lasting cough, possibly with blood.  Fatigue.  Unexplained weight loss.  Shortness of breath.  Loud breathing (wheezing).  Chest pain.  Loss of appetite. Symptoms of advanced lung cancer include:  Hoarseness.  Bone or joint pain.  Weakness.  Change in the structure of the fingernails (clubbing), so that the nail looks like an upside-down spoon.  Swelling of the face or arms.  Inability to move the face (paralysis).  Drooping eyelids. How is this diagnosed? This condition may be diagnosed based on:  Your symptoms and medical history.  A physical exam.  A chest X-ray.  A CT scan.  Blood tests.  Sputum tests.  Removal of a sample of lung tissue (lung biopsy) for testing. Your cancer will be assessed (staged) to determine how severe it is and how much it has spread (metastasized). How is this treated? Treatment depends on the type and stage of your cancer. Treatment may include one or more of the following:  Surgery to remove as much of the cancer as possible. Lymph nodes in the area may be removed and tested for cancer as well.  Medicines that kill cancer cells (chemotherapy).  High-energy rays that kill cancer cells (radiation therapy).  Chemotherapy. This treatment uses medicines to destroy cancer cells.  Targeted therapy. This targets specific parts of cancer cells and the area around them to block the growth and spread of the cancer. Targeted therapy can help limit the damage to healthy cells. Follow these instructions at home: Eating and drinking  Some of your treatments might affect your appetite. If you are having problems eating, or if you do not have an appetite, meet with a dietitian.  If you have side effects that affect your appetite, it may help to: ? Eat smaller meals and snacks often. ? Drink high-nutrition and high-calorie shakes or supplements. ? Eat bland and soft foods that are easy to  eat. ? Avoid eating foods that are hot, spicy, or hard to swallow. General instructions   Do not use any products that contain nicotine or tobacco, such as cigarettes and e-cigarettes. If you need help quitting, ask your health care provider.  Do not drink alcohol.  If you are admitted to the hospital, make sure your cancer specialist (oncologist) is aware. Your cancer may affect your treatment for other conditions.  Take over-the-counter and prescription medicines only as told by your health care provider.  Consider joining a support group for people who have been diagnosed with lung cancer.  Work with your health care provider to manage any side effects of treatment.  Keep all follow-up visits as told by your health care provider. This is important. Where to find more information  American Cancer Society: https://www.cancer.[place] https://www.cancer.gov Contact a health care provider if you:  Lose weight without trying.  Have a persistent cough and wheezing.  Feel short of breath.  Get tired easily.  Have bone or joint pain.  Have difficulty swallowing.  Notice that your voice is changing or getting hoarse.  Have pain that does not get better with medicine. Get help right away if you:  Cough up blood.  Have new breathing problems.  Have chest pain.  Have a fever.  Have swelling in an ankle, leg, or arm, or the face or neck.  Have paralysis in your face.  Are very confused.  Have a drooping eyelid. Summary  Lung cancer is an abnormal growth of cancerous cells that forms a mass (malignant tumor) in a lung.  There are several types of lung cancer. The types are based on the appearance of the tumor cells. The two most common types are non-small cell and small cell.  The most common cause of lung cancer is smoking tobacco.  Early symptoms include a lasting cough, possibly with blood, and fatigue, unexplained weight loss, and  shortness of breath.  After diagnosis, treatment depends on the type and stage of your cancer. This information is not intended to replace advice given to you by your health care provider. Make sure you discuss any questions you have with your health care provider. Document Revised: 10/14/2017 Document Reviewed: 09/08/2017 Elsevier Patient Education  2020 [employer].

## 2020-03-14 ENCOUNTER — Encounter (HOSPITAL_COMMUNITY)
Admission: RE | Admit: 2020-03-14 | Discharge: 2020-03-14 | Disposition: A | Discharge status: Home / Self Care | Payer: BC Managed Care – PPO | Source: Ambulatory Visit | Attending: Pulmonary Disease | Admitting: Pulmonary Disease

## 2020-03-14 DIAGNOSIS — C801 Malignant (primary) neoplasm, unspecified: Secondary | ICD-10-CM | POA: Diagnosis not present

## 2020-03-14 DIAGNOSIS — R918 Other nonspecific abnormal finding of lung field: Secondary | ICD-10-CM | POA: Diagnosis not present

## 2020-03-14 DIAGNOSIS — R590 Localized enlarged lymph nodes: Secondary | ICD-10-CM | POA: Diagnosis not present

## 2020-03-14 DIAGNOSIS — C771 Secondary and unspecified malignant neoplasm of intrathoracic lymph nodes: Secondary | ICD-10-CM | POA: Diagnosis not present

## 2020-03-14 LAB — GLUCOSE, CAPILLARY: Glucose-Capillary: 165 mg/dL — ABNORMAL HIGH (ref 70–99)

## 2020-03-14 MED ORDER — FLUDEOXYGLUCOSE F - 18 (FDG) INJECTION
11.5000 | Freq: Once | INTRAVENOUS | Status: AC | PRN
  Administered 2020-03-14: 11.5 via INTRAVENOUS

## 2020-03-17 ENCOUNTER — Encounter: Payer: Self-pay | Admitting: *Deleted

## 2020-03-17 NOTE — Progress Notes (Signed)
Oncology Nurse Navigator Documentation  Oncology Nurse Navigator Flowsheets 03/17/2020  Abnormal Finding Date -  Navigator Location [facility]  Referral Date to RadOnc/MedOnc -  Navigator Encounter Type Other/I reached out to scheduling and rad onc so William Farrell can get scheduled for his treatment.    Telephone -  Treatment Phase Pre-Tx/Tx Discussion  Barriers/Navigation Needs Coordination of Care  Education -  Interventions Coordination of Care  Acuity Level 2-Minimal Needs (1-2 Barriers Identified)  Coordination of Care Other  Education Method -  Time Spent with Patient 30

## 2020-03-19 ENCOUNTER — Telehealth: Payer: Self-pay | Admitting: Medical Oncology

## 2020-03-19 NOTE — Progress Notes (Signed)
Thoracic Location of Tumor / Histology: Right Upper Lobe  Patient presented with cough, sob with exertion.  Wife had CT scan done for her COPD and encouraged the patient have one done due to his tobacco use.  MRI Brain 03/22/2020  PET 03/14/2020: Markedly hypermetabolic central right upper lobe pulmonary lesion consistent with primary bronchogenic neoplasm. This is associated with hypermetabolic nodal metastases in the right hilum and mediastinum. No evidence for hypermetabolic metastatic disease in the neck, abdomen, or pelvis. Tiny nonobstructing right renal stone. Colonic diverticulosis without diverticulitis.  Bronchoscopy 03/11/2020  CT Chest 03/05/2020: Masslike opacity in the medial right upper lobe/suprahilar region, measuring approximately 3.8 cm highly suspicious for primary bronchogenic neoplasm. Associated mediastinal lymphadenopathy, suspicious for nodal metastases. Discussion at multidisciplinary tumor board is suggested.  Biopsies of Right Upper Lobe 03/11/2020   Tobacco/Marijuana/Snuff/ETOH use: Trying to quit smoking, he only takes a puff or two.  Past/Anticipated interventions by cardiothoracic surgery, if any:   Past/Anticipated interventions by medical oncology, if any:  Dr. Julien Nordmann 03/13/2020 -I recommended for the patient to complete the staging work-up and he is scheduled to have a PET scan tomorrow.  I will also order MRI of the brain to rule out brain metastasis. -I will request the tissue block to be sent for PD-L1 expression. If he has no evidence of metastatic disease on the upcoming imaging studies, I recommended for the patient a course of concurrent chemoradiation with weekly carboplatin for AUC of 2 and paclitaxel 45 NG/M2 for 6-7 weeks.  This will be followed by consolidation immunotherapy with Imfinzi if the patient has no evidence for disease progression after the induction phase. -I will refer the patient to radiation oncology. He is expected to start the  first cycle of this treatment on Mar 24, 2020. -The patient also mentions that he is interested in a second opinion at [facility] and he already has planned to travel there for the visit. -If needed we can delay the start of his treatment by 1 more week until he has his visit done at [facility].   No appointment for [facility] has been scheduled.  Signs/Symptoms  Weight changes, if any: No  Respiratory complaints, if any: Has some sob and wheezing.  Hemoptysis, if any: Has occasional productive cough with clear phlegm.  He has had smokers cough for 25 years.   Pain issues, if any:  No  SAFETY ISSUES:  Prior radiation? No  Pacemaker/ICD? No  Possible current pregnancy? n/a  Is the patient on methotrexate? No  Current Complaints / other details:

## 2020-03-19 NOTE — Telephone Encounter (Addendum)
Pt called -wants PET scan results requested-ordered by Dr Valeta Harms.   "I do not want appts without me knowing in advance. I work and I am getting a second opinion".  Per William Farrell I told pt  the PET scan showed hypermetabolic RUL lesion and  lymph nodes consistent with primary bronchogenic carcinoma. He said to cancel his appts 05/07 and 05/10 and keep appt on the 17th. Schedule message sent for [facility] visit on 5/17.

## 2020-03-20 ENCOUNTER — Ambulatory Visit
Admission: RE | Admit: 2020-03-20 | Discharge: 2020-03-20 | Disposition: A | Discharge status: Home / Self Care | Payer: BC Managed Care – PPO | Source: Ambulatory Visit | Attending: Radiation Oncology | Admitting: Radiation Oncology

## 2020-03-20 ENCOUNTER — Telehealth: Payer: Self-pay | Admitting: Medical Oncology

## 2020-03-20 ENCOUNTER — Encounter: Payer: Self-pay | Admitting: Radiation Oncology

## 2020-03-20 ENCOUNTER — Other Ambulatory Visit: Payer: Self-pay

## 2020-03-20 ENCOUNTER — Other Ambulatory Visit: Payer: Self-pay | Admitting: Medical Oncology

## 2020-03-20 ENCOUNTER — Encounter: Payer: Self-pay | Admitting: Medical Oncology

## 2020-03-20 VITALS — Ht 72.0 in | Wt 235.0 lb

## 2020-03-20 DIAGNOSIS — C3491 Malignant neoplasm of unspecified part of right bronchus or lung: Secondary | ICD-10-CM

## 2020-03-20 DIAGNOSIS — F17210 Nicotine dependence, cigarettes, uncomplicated: Secondary | ICD-10-CM | POA: Diagnosis not present

## 2020-03-20 DIAGNOSIS — C3411 Malignant neoplasm of upper lobe, right bronchus or lung: Secondary | ICD-10-CM | POA: Diagnosis not present

## 2020-03-20 DIAGNOSIS — C771 Secondary and unspecified malignant neoplasm of intrathoracic lymph nodes: Secondary | ICD-10-CM | POA: Diagnosis not present

## 2020-03-20 NOTE — Telephone Encounter (Signed)
LVM to call me if he has any concerns and that someone will call him to schedule chemo class.

## 2020-03-20 NOTE — Telephone Encounter (Signed)
He needs 3 questions answered before scheduling chemo. What time will it be on mondays ? How long will it take? What is the next step after 6 weeks ? email sent.

## 2020-03-20 NOTE — Telephone Encounter (Signed)
Reviewed appts with pts.

## 2020-03-20 NOTE — Progress Notes (Signed)
Radiation Oncology         ([phone] ________________________________  Initial Outpatient Consultation - Conducted via telephone due to current COVID-19 concerns for limiting patient exposure  I spoke with the patient to conduct this consult visit via telephone to spare the patient unnecessary potential exposure in the healthcare setting during the current COVID-19 pandemic. The patient was notified in advance and was offered a [place] meeting to allow for face to face communication but unfortunately reported that they did not have the appropriate resources/technology to support such a visit and instead preferred to proceed with a telephone consult.    Name: William Farrell        MRN: [id number]  Date of Service: 03/20/2020 DOB: 1961/07/28  CC:William Reeve, DO  William Bears, MD     REFERRING PHYSICIAN: Curt Bears, MD   DIAGNOSIS: The encounter diagnosis was Stage III squamous cell carcinoma of right lung ([place]).   HISTORY OF PRESENT ILLNESS: William Farrell is a 59 y.o. male seen at the request of Dr. Julien Nordmann for a recently diagnosed non-small cell lung cancer of the right upper lobe. The patient was seen for lung cancer screening clinic given his tobacco history, he proceeded with CT imaging of the chest on 03/05/2020 which did reveal a masslike opacity in the medial right upper lobe along the suprahilar region which was difficult to discretely measure but was approximately 3.8 x 2.9 cm with a soft tissue associated that appeared to be narrowing/encasing the right upper lobe bronchus, nodular opacity extending posteriorly to the right upper lobe along the major fissure measuring 16.2 mm was noted, and he had a 16 mm short axis right paratracheal lymph node and a 14 mm short axis subcarinal lymph node. He did undergo bronchoscopy with endobronchial ultrasound with Dr. Valeta Harms on 03/11/2020, and his biopsy and sampling of a 7R node were consistent with squamous cell carcinoma.  PD-L1 testing revealed a level of 10%. He met Dr. Julien Nordmann who recommended proceeding with PET imaging and likely chemoradiation. He underwent PET imaging on 03/14/2020 which revealed an irregular super hilar mass with a SUV max of 11.9, his precarinal metastatic adenopathy was hypermetabolic with an SUV max of 9.1, and the subcarinal adenopathy, was 11.7. Hypermetabolic nodal metastases were also seen in the high right paratracheal and prevascular space of the mediastinum. He had stigmata of a right renal stone, diverticulosis, emphysematous changes and atherosclerosis of the aorta. He is scheduled to undergo an MRI of the brain on 03/22/2020. He is seen today to discuss proceeding with chemoradiation provided that his MRI is negative for metastatic disease. He is also planning a second opinion with [facility], which is not scheduled but he is trying to coordinate in the near future.    PREVIOUS RADIATION THERAPY: No   PAST MEDICAL HISTORY:  Past Medical History:  Diagnosis Date  . Asthma    as a child  . Diabetes ([place])   . Diverticulitis   . GERD (gastroesophageal reflux disease)   . High cholesterol   . History of kidney stones        PAST SURGICAL HISTORY: Past Surgical History:  Procedure Laterality Date  . broken bone repair    . BRONCHIAL BIOPSY  03/11/2020   Procedure: BRONCHIAL BIOPSIES;  Surgeon: Garner Nash, DO;  Location: [facility];  Service: Pulmonary;;  . COLONOSCOPY    . CRYOTHERAPY  03/11/2020   Procedure: CRYOTHERAPY;  Surgeon: Garner Nash, DO;  Location: [facility];  Service: Pulmonary;;  .  FINE NEEDLE ASPIRATION  03/11/2020   Procedure: FINE NEEDLE ASPIRATION (FNA) LINEAR;  Surgeon: Garner Nash, DO;  Location: [facility];  Service: Pulmonary;;  . KNEE ARTHROSCOPY WITH ANTERIOR CRUCIATE LIGAMENT (ACL) REPAIR     x 2  . VIDEO BRONCHOSCOPY WITH ENDOBRONCHIAL ULTRASOUND N/A 03/11/2020   Procedure: VIDEO BRONCHOSCOPY WITH ENDOBRONCHIAL ULTRASOUND;   Surgeon: Garner Nash, DO;  Location: [place];  Service: Pulmonary;  Laterality: N/A;     FAMILY HISTORY:  Family History  Problem Relation Age of Onset  . High blood pressure Mother   . Stroke Mother      SOCIAL HISTORY:  reports that he has been smoking cigarettes. He has a 86.00 pack-year smoking history. He has never used smokeless tobacco. He reports previous alcohol use. He reports previous drug use. The patient is married and lives in [place]. 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   ALLERGIES: Sitagliptin   MEDICATIONS:  Current Outpatient Medications  Medication Sig Dispense Refill  . acetaminophen (TYLENOL) 500 MG tablet Take 1,000 mg by mouth every 6 (six) hours as needed for moderate pain or headache.    . albuterol (VENTOLIN HFA) 108 (90 Base) MCG/ACT inhaler Inhale 2 puffs into the lungs every 6 (six) hours as needed for wheezing or shortness of breath.     Marland Kitchen aspirin EC 81 MG tablet Take 81 mg by mouth daily.    Marland Kitchen atorvastatin (LIPITOR) 40 MG tablet Take 1 tablet (40 mg total) by mouth daily. 90 tablet 3  . calcium carbonate (TUMS - DOSED IN MG ELEMENTAL CALCIUM) 500 MG chewable tablet Chew 2 tablets by mouth daily as needed for indigestion or heartburn.    Marland Kitchen glipiZIDE (GLUCOTROL) 5 MG tablet Take 1 tablet (5 mg total) by mouth daily before breakfast. 90 tablet 3  . glucose blood test strip Use as instructed up to qid 100 each 99  . meloxicam (MOBIC) 15 MG tablet Take 1 tablet (15 mg total) by mouth daily. 90 tablet 3  . metFORMIN (GLUCOPHAGE) 1000 MG tablet Take 1 tablet (1,000 mg total) by mouth 2 (two) times daily with a meal. 180 tablet 3  . nicotine (NICODERM CQ - DOSED IN MG/24 HR) 7 mg/24hr patch Place 1 patch (7 mg total) onto the skin daily. 28 patch 0  . OneTouch Delica Lancets 76O MISC As directed up to qid (Patient not taking: Reported on 03/13/2020) 100 each 99  . trolamine salicylate  (ASPERCREME) 10 % cream Apply 1 application topically as needed for muscle pain.    . varenicline (CHANTIX STARTING MONTH PAK) 0.5 MG X 11 & 1 MG X 42 tablet Take one 0.5 mg tablet by mouth once daily for 3 days, then increase to one 0.5 mg tablet twice daily for 4 days, then increase to one 1 mg tablet twice daily. 53 tablet 0  . varenicline (CHANTIX) 1 MG tablet Take 1 tablet (1 mg total) by mouth 2 (two) times daily. (Patient not taking: Reported on 03/13/2020) 180 tablet 1   No current facility-administered medications for this encounter.     REVIEW OF SYSTEMS: On review of systems, the patient reports that he is doing well overall. He has been trying to quit smoking and has been pretty successful so far. He denies any chest pain, shortness of breath, has an occasional cough, without hemoptysis, fevers, chills, night sweats, or unintended weight changes. No other complaints are noted.     PHYSICAL EXAM:  Wt Readings from Last 3 Encounters:  03/13/20 235 lb 1.6 oz (106.6 kg)  03/11/20 233 lb (105.7 kg)  03/06/20 235 lb 6.4 oz (106.8 kg)   Unable to assess given encounter type.   ECOG = 0  0 - Asymptomatic (Fully active, able to carry on all predisease activities without restriction)  1 - Symptomatic but completely ambulatory (Restricted in physically strenuous activity but ambulatory and able to carry out work of a light or sedentary nature. For example, light housework, office work)  2 - Symptomatic, <50% in bed during the day (Ambulatory and capable of all self care but unable to carry out any work activities. Up and about more than 50% of waking hours)  3 - Symptomatic, >50% in bed, but not bedbound (Capable of only limited self-care, confined to bed or chair 50% or more of waking hours)  4 - Bedbound (Completely disabled. Cannot carry on any self-care. Totally confined to bed or chair)  5 - Death   Eustace Pen MM, Creech RH, Tormey DC, et al. [phone]). "Toxicity and response criteria  of the [facility]". [place] Oncol. 5 (6): 649-55    LABORATORY DATA:  Lab Results  Component Value Date   WBC 9.0 03/11/2020   HGB 12.5 (L) 03/11/2020   HCT 39.3 03/11/2020   MCV 86.9 03/11/2020   PLT 256 03/11/2020   Lab Results  Component Value Date   NA 138 03/11/2020   K 4.2 03/11/2020   CL 108 03/11/2020   CO2 21 (L) 03/11/2020   Lab Results  Component Value Date   ALT 18 03/11/2020   AST 18 03/11/2020   ALKPHOS 107 03/11/2020   BILITOT 0.8 03/11/2020      RADIOGRAPHY: NM PET Image Initial (PI) Skull Base To Thigh  Result Date: 03/14/2020 CLINICAL DATA:  Initial treatment strategy for right lung mass. EXAM: NUCLEAR MEDICINE PET SKULL BASE TO THIGH TECHNIQUE: 11.5 mCi F-18 FDG was injected intravenously. Full-ring PET imaging was performed from the skull base to thigh after the radiotracer. CT data was obtained and used for attenuation correction and anatomic localization. Fasting blood glucose: 165 mg/dl COMPARISON:  Lung cancer screening CT 03/05/2020 FINDINGS: Mediastinal blood pool activity: SUV max 2.7 Liver activity: SUV max NA NECK: No hypermetabolic lymph nodes in the neck. Incidental CT findings: none CHEST: Irregular suprahilar mass identified on recent lung cancer screening CT is markedly hypermetabolic with SUV max = 63.8. Precarinal metastatic lymphadenopathy is hypermetabolic with SUV max = 9.1. Subcarinal hypermetabolic lymphadenopathy is hypermetabolic with SUV max = 75.6. Hypermetabolic nodal metastases are seen in the high right paratracheal and prevascular space of the mediastinum. Incidental CT findings: Centrilobular emphsyema noted. ABDOMEN/PELVIS: No abnormal hypermetabolic activity within the liver, pancreas, adrenal glands, or spleen. No hypermetabolic lymph nodes in the abdomen or pelvis. Incidental CT findings: Tiny nonobstructing stone identified lower pole right kidney. There is abdominal aortic atherosclerosis without  aneurysm. Left colonic diverticulosis without diverticulitis. Mild circumferential bladder wall thickening may be related to underdistention. Small bilateral groin hernias contain only fat. SKELETON: No focal hypermetabolic activity to suggest skeletal metastasis. Incidental CT findings: none IMPRESSION: 1. Markedly hypermetabolic central right upper lobe pulmonary lesion consistent with primary bronchogenic neoplasm. This is associated with hypermetabolic nodal metastases in the right hilum and mediastinum. No evidence for hypermetabolic metastatic disease in the neck, abdomen, or pelvis. 2. Tiny nonobstructing right renal stone. 3. Colonic diverticulosis without diverticulitis. 4.  Emphysema. (EPP29-J18.9) Aortic Atherosclerois (ICD10-170.0) Electronically Signed  By: Misty Stanley M.D.   On: 03/14/2020 09:37   CT CHEST LUNG CA SCREEN LOW DOSE W/O CM  Result Date: 03/05/2020 CLINICAL DATA:  59 year old male current smoker, with 45 pack-year history of smoking, for initial lung cancer screening EXAM: CT CHEST WITHOUT CONTRAST LOW-DOSE FOR LUNG CANCER SCREENING TECHNIQUE: Multidetector CT imaging of the chest was performed following the standard protocol without IV contrast. COMPARISON:  None. FINDINGS: Cardiovascular: Heart is normal in size.  No pericardial effusion. No evidence of thoracic aortic aneurysm. Very mild coronary atherosclerosis in the LAD. Mediastinum/Nodes: Mediastinal lymphadenopathy, including a 16 mm short axis right paratracheal node (series 2/image 24) and a 14 mm short axis subcarinal node (series 2/image 36). Visualized thyroid is unremarkable. Lungs/Pleura: Biapical pleural-parenchymal scarring. Masslike opacity in the medial right upper lobe/suprahilar region (series 4/image 146), difficult to discretely measure, approximately 3.8 x 2.9 cm (series 4/image 146). Associated soft tissue narrowing/encasing the right upper lobe bronchus (series 2/image 33). Nodular opacity extending  posteriorly to the right upper lobe along the major fissure (series 4/image 161), measuring 16.2 mm. Mild centrilobular and paraseptal emphysematous changes, upper lung predominant. No pleural effusion or pneumothorax. Upper Abdomen: Visualized upper abdomen is grossly unremarkable. Musculoskeletal: Visualized osseous structures are within normal limits. IMPRESSION: Lung-RADS 4B, suspicious. Additional imaging evaluation or consultation with Pulmonology or Thoracic Surgery recommended. Masslike opacity in the medial right upper lobe/suprahilar region, measuring approximately 3.8 cm highly suspicious for primary bronchogenic neoplasm. Associated mediastinal lymphadenopathy, suspicious for nodal metastases. Discussion at multidisciplinary tumor board is suggested. Consider bronchoscopy and/or PET-CT, as clinically warranted. These results will be called to the ordering clinician or representative by the Radiologist Assistant, and communication documented in the PACS or [employer]. Aortic Atherosclerosis (ICD10-I70.0) and Emphysema (ICD10-J43.9). Electronically Signed   By: Julian Hy M.D.   On: 03/05/2020 15:35       IMPRESSION/PLAN: 1. Stage IIIA, cT2aN2M0, NSCLC, squamous cell carcinoma of the RUL. Dr. Lisbeth Renshaw discusses the pathology findings and reviews the nature of locally advanced lung disease. He discusses the NCCN guidelines for workup and staging. He agrees that the patient should proceed with an MRI of the brain and we will follow-up with these results when available. Based on his disease at present, Dr. Lisbeth Renshaw recommends a course of chemoradiation and he discussed the utility of radiotherapy in the management of this type of cancer as well as the goals of therapy being curative. We discussed the risks, benefits, short, and long term effects of radiotherapy, and the patient is also planning a second opinion at [facility]. Dr. Lisbeth Renshaw discusses the delivery and logistics of radiotherapy and  anticipates a course of 6 1/2 weeks of radiotherapy. The patient is interested in proceeding, and he will simulate on Monday on 03/24/20. We anticipate starting therapy on 03/31/20 and make any adjustments in start date as needed pending his second opinion.  2. Covid vaccination schedule. The patient has completed his vaccine series in April 2021 and received both doses of [place].   Given current concerns for patient exposure during the COVID-19 pandemic, this encounter was conducted via telephone.  The patient has provided two factor identification and has given verbal consent for this type of encounter and has been advised to only accept a meeting of this type in a secure network environment. The time spent during this encounter was 60 minutes including preparation, discussion, and coordination of the patient's care. The attendants for this meeting include Blenda Nicely, RN, Dr. Lisbeth Renshaw, Hayden Pedro  and [place] and [employer].  During the encounter,  Blenda Nicely, RN, Dr. Lisbeth Renshaw, and Hayden Pedro were located at [facility] Radiation Oncology Department.  [place] and [employer] were located at home.   The above documentation reflects my direct findings during this shared patient visit. Please see the separate note by Dr. Lisbeth Renshaw on this date for the remainder of the patient's plan of care.    Carola Rhine, PAC

## 2020-03-21 ENCOUNTER — Other Ambulatory Visit: Payer: BC Managed Care – PPO

## 2020-03-21 ENCOUNTER — Encounter: Payer: Self-pay | Admitting: General Practice

## 2020-03-21 NOTE — Progress Notes (Signed)
[place] Psychosocial Distress Screening Clinical Social Work  Clinical Social Work was referred by distress screening protocol.  The patient scored a 8 on the Psychosocial Distress Thermometer which indicates moderate distress. Clinical Social Worker contacted patient by phone to assess for distress and other psychosocial needs.   ONCBCN DISTRESS SCREENING 03/20/2020  Screening Type Initial Screening  Distress experienced in past week (1-10) 8  Information Concerns Type Lack of info about treatment;Lack of info about diagnosis  Other Contact via phone    Barriers to care/review of distress screen:  - Transportation:  Do you anticipate any problems getting to appointments?  Do you have someone who can help run errands for you if you need it?  No issues - Help at home:  What is your living situation (alone, family, other)?  If you are physically unable to care for yourself, who would you call on to help you? [name], can help as needed.   - Support system:  What does your support system look like?  Who would you call on if you needed some kind of practical help?  What if you needed someone to talk to for emotional support?  Many friends and family who can help.   - Finances:  Are you concerned about finances.  Considering returning to work?  If not, applying for disability?  Intends to work full time during treatment.  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  Work has been very supportive, no issues/concerns  What is your understanding of where you are with your cancer? Its cause?  Your treatment plan and what happens next? Newly diagnosed with stage 3 lung cancer, still undergoing work up so this may change.  He is upbeat and positive - reports that initial scans have been encouraging, has PET scan tomorrow.  Wants to continue to work throughout treatment, from what he has heard, this should be possible for him.      CSW Summary:  Patient and family psychosocial  functioning including strengths, limitations, and coping skills:  59 yo male, newly diagnosed w lung cancer.  [name], no children.  Reports good support from family, friends, co workers.  Feels well, hopes to continue to work throughout treatment but has options to take time off as needed.  Optimistic about treatment success, eager to begin treatment.    Identifications of barriers to care: None noted  Availability of community resources:  Mentioned Lung Cancer Initiative and Cancer and Careers as resources. Also briefly reviewed [place] Worker follow up needed: No.  Clinical Social Worker follow up needed: No.  If yes, follow up plan:  Beverely Pace, [place], LCSW Clinical Social Worker Phone:  [phone]

## 2020-03-22 ENCOUNTER — Ambulatory Visit (HOSPITAL_BASED_OUTPATIENT_CLINIC_OR_DEPARTMENT_OTHER)
Admission: RE | Admit: 2020-03-22 | Discharge: 2020-03-22 | Disposition: A | Discharge status: Home / Self Care | Payer: BC Managed Care – PPO | Source: Ambulatory Visit | Attending: Internal Medicine | Admitting: Internal Medicine

## 2020-03-22 ENCOUNTER — Other Ambulatory Visit: Payer: Self-pay

## 2020-03-22 DIAGNOSIS — C3490 Malignant neoplasm of unspecified part of unspecified bronchus or lung: Secondary | ICD-10-CM

## 2020-03-22 MED ORDER — GADOBUTROL 1 MMOL/ML IV SOLN
10.0000 mL | Freq: Once | INTRAVENOUS | Status: AC | PRN
  Administered 2020-03-22: 10 mL via INTRAVENOUS

## 2020-03-24 ENCOUNTER — Ambulatory Visit: Payer: BC Managed Care – PPO | Admitting: Physician Assistant

## 2020-03-24 ENCOUNTER — Other Ambulatory Visit: Payer: BC Managed Care – PPO

## 2020-03-24 ENCOUNTER — Other Ambulatory Visit: Payer: Self-pay

## 2020-03-24 ENCOUNTER — Other Ambulatory Visit: Payer: Self-pay | Admitting: *Deleted

## 2020-03-24 ENCOUNTER — Ambulatory Visit
Admission: RE | Admit: 2020-03-24 | Discharge: 2020-03-24 | Disposition: A | Discharge status: Home / Self Care | Payer: BC Managed Care – PPO | Source: Ambulatory Visit | Attending: Radiation Oncology | Admitting: Radiation Oncology

## 2020-03-24 ENCOUNTER — Ambulatory Visit: Payer: BC Managed Care – PPO

## 2020-03-24 DIAGNOSIS — C3411 Malignant neoplasm of upper lobe, right bronchus or lung: Secondary | ICD-10-CM | POA: Diagnosis not present

## 2020-03-24 DIAGNOSIS — Z510 Encounter for antineoplastic radiation therapy: Secondary | ICD-10-CM | POA: Insufficient documentation

## 2020-03-24 NOTE — Progress Notes (Signed)
The proposed treatment discussion at cancer conference 03/20/20 is for discussion purpose only and is not a binding recommendation.  The patient was not physically examined nor present for their treatment options.  Therefore, final treatment plans cannot be decided.

## 2020-03-25 ENCOUNTER — Other Ambulatory Visit: Payer: Self-pay

## 2020-03-25 ENCOUNTER — Encounter: Payer: Self-pay | Admitting: Physician Assistant

## 2020-03-25 ENCOUNTER — Other Ambulatory Visit: Payer: Self-pay | Admitting: Medical Oncology

## 2020-03-25 ENCOUNTER — Inpatient Hospital Stay: Payer: BC Managed Care – PPO | Attending: Internal Medicine

## 2020-03-25 ENCOUNTER — Telehealth: Payer: Self-pay | Admitting: *Deleted

## 2020-03-25 DIAGNOSIS — Z79899 Other long term (current) drug therapy: Secondary | ICD-10-CM | POA: Insufficient documentation

## 2020-03-25 DIAGNOSIS — Z5111 Encounter for antineoplastic chemotherapy: Secondary | ICD-10-CM | POA: Insufficient documentation

## 2020-03-25 DIAGNOSIS — Z7982 Long term (current) use of aspirin: Secondary | ICD-10-CM | POA: Insufficient documentation

## 2020-03-25 DIAGNOSIS — E7800 Pure hypercholesterolemia, unspecified: Secondary | ICD-10-CM | POA: Insufficient documentation

## 2020-03-25 DIAGNOSIS — C3411 Malignant neoplasm of upper lobe, right bronchus or lung: Secondary | ICD-10-CM | POA: Insufficient documentation

## 2020-03-25 DIAGNOSIS — Z791 Long term (current) use of non-steroidal anti-inflammatories (NSAID): Secondary | ICD-10-CM | POA: Insufficient documentation

## 2020-03-25 DIAGNOSIS — K219 Gastro-esophageal reflux disease without esophagitis: Secondary | ICD-10-CM | POA: Insufficient documentation

## 2020-03-25 DIAGNOSIS — Z7984 Long term (current) use of oral hypoglycemic drugs: Secondary | ICD-10-CM | POA: Insufficient documentation

## 2020-03-25 DIAGNOSIS — E119 Type 2 diabetes mellitus without complications: Secondary | ICD-10-CM | POA: Insufficient documentation

## 2020-03-25 DIAGNOSIS — Z87442 Personal history of urinary calculi: Secondary | ICD-10-CM | POA: Insufficient documentation

## 2020-03-25 DIAGNOSIS — J45909 Unspecified asthma, uncomplicated: Secondary | ICD-10-CM | POA: Insufficient documentation

## 2020-03-25 MED ORDER — PROCHLORPERAZINE MALEATE 10 MG PO TABS: 10.0000 mg | ORAL_TABLET | Freq: Four times a day (QID) | ORAL | 0 refills | Status: DC | PRN

## 2020-03-25 NOTE — Progress Notes (Signed)
Met with patient's spouse at waiting area to introduce myself as Arboriculturist and offer available resources.  Discussed one-time $1000 grant and qualifications to assist with personal expenses while going through treatment.  Gave her my card if interested in applying with grant information on the back and for any additional financial questions or concerns.

## 2020-03-25 NOTE — Progress Notes (Signed)
Pharmacist Chemotherapy Monitoring - Initial Assessment    Anticipated start date: 03/31/20   Regimen:  . Are orders appropriate based on the patient's diagnosis, regimen, and cycle? Yes . Does the plan date match the patient's scheduled date? Yes . Is the sequencing of drugs appropriate? Yes . Are the premedications appropriate for the patient's regimen? Yes . Prior Authorization for treatment is: Approved o If applicable, is the correct biosimilar selected based on the patient's insurance? not applicable  Organ Function and Labs: Marland Kitchen Are dose adjustments needed based on the patient's renal function, hepatic function, or hematologic function? No . Are appropriate labs ordered prior to the start of patient's treatment? Yes . Other organ system assessment, if indicated: N/A . The following baseline labs, if indicated, have been ordered: N/A  Dose Assessment: . Are the drug doses appropriate? Yes . Are the following correct: o Drug concentrations Yes o IV fluid compatible with drug Yes o Administration routes Yes o Timing of therapy Yes . If applicable, does the patient have documented access for treatment and/or plans for port-a-cath placement? no . If applicable, have lifetime cumulative doses been properly documented and assessed? not applicable  Toxicity Monitoring/Prevention: . The patient has the following take home antiemetics prescribed: Prochlorperazine . The patient has the following take home medications prescribed: N/A . Medication allergies and previous infusion related reactions, if applicable, have been reviewed and addressed. Yes . The patient's current medication list has been assessed for drug-drug interactions with their chemotherapy regimen. no significant drug-drug interactions were identified on review.  Order Review: . Are the treatment plan orders signed? Yes . Is the patient scheduled to see a provider prior to their treatment? No  I verify that I have  reviewed each item in the above checklist and answered each question accordingly.   Kennith Center, Pharm.D., CPP 03/25/2020@5 :11 PM

## 2020-03-25 NOTE — Telephone Encounter (Signed)
Pt agreed to education research on immunotherapy.  Consent signed & witnessed.  Pre test done & pt teaching done & post test done.

## 2020-03-26 ENCOUNTER — Other Ambulatory Visit: Payer: Self-pay | Admitting: Radiation Oncology

## 2020-03-26 ENCOUNTER — Telehealth: Payer: Self-pay | Admitting: *Deleted

## 2020-03-26 ENCOUNTER — Telehealth: Payer: Self-pay | Admitting: Medical Oncology

## 2020-03-26 MED ORDER — AMOXICILLIN 500 MG PO CAPS: 500.0000 mg | ORAL_CAPSULE | Freq: Three times a day (TID) | ORAL | 0 refills | Status: AC

## 2020-03-26 NOTE — Telephone Encounter (Signed)
Painful  tooth,ear and jaw. "it is below the gumline " Worthy Flank, PA-C in  XRT called in amoxicillin. He is also starting Sudafed " for fluid behind my ear" .   May 17th -First Chemo and radiation scheduled .  LVM with Dr Enrique Sack for appt for pt.

## 2020-03-26 NOTE — Telephone Encounter (Signed)
Spoke with the patient to let him know that we received his message and a prescription for amoxicillin has been sent in to [facility] [employer] in [place].  He was advised to follow-up with his dentist and to inform Dr. Julien Nordmann office.  He verbalized understanding of this.  Will continue to follow as necessary.  Gloriajean Dell. Leonie Green, BSN

## 2020-03-26 NOTE — Telephone Encounter (Signed)
Pt aware of referral to Dr Enrique Sack.

## 2020-03-27 ENCOUNTER — Other Ambulatory Visit: Payer: Self-pay | Admitting: *Deleted

## 2020-03-27 DIAGNOSIS — C3491 Malignant neoplasm of unspecified part of right bronchus or lung: Secondary | ICD-10-CM

## 2020-03-27 NOTE — Telephone Encounter (Signed)
William Farrell is out of the office today and back tomorrow . She will f/u with me about appt

## 2020-03-28 ENCOUNTER — Telehealth: Payer: Self-pay | Admitting: Medical Oncology

## 2020-03-28 ENCOUNTER — Inpatient Hospital Stay: Payer: BC Managed Care – PPO

## 2020-03-28 ENCOUNTER — Other Ambulatory Visit: Payer: Self-pay

## 2020-03-28 DIAGNOSIS — Z79899 Other long term (current) drug therapy: Secondary | ICD-10-CM | POA: Diagnosis not present

## 2020-03-28 DIAGNOSIS — Z510 Encounter for antineoplastic radiation therapy: Secondary | ICD-10-CM | POA: Diagnosis not present

## 2020-03-28 DIAGNOSIS — Z87442 Personal history of urinary calculi: Secondary | ICD-10-CM | POA: Diagnosis not present

## 2020-03-28 DIAGNOSIS — Z7984 Long term (current) use of oral hypoglycemic drugs: Secondary | ICD-10-CM | POA: Diagnosis not present

## 2020-03-28 DIAGNOSIS — Z7982 Long term (current) use of aspirin: Secondary | ICD-10-CM | POA: Diagnosis not present

## 2020-03-28 DIAGNOSIS — C3411 Malignant neoplasm of upper lobe, right bronchus or lung: Secondary | ICD-10-CM | POA: Diagnosis not present

## 2020-03-28 DIAGNOSIS — K219 Gastro-esophageal reflux disease without esophagitis: Secondary | ICD-10-CM | POA: Diagnosis not present

## 2020-03-28 DIAGNOSIS — J45909 Unspecified asthma, uncomplicated: Secondary | ICD-10-CM | POA: Diagnosis not present

## 2020-03-28 DIAGNOSIS — E7800 Pure hypercholesterolemia, unspecified: Secondary | ICD-10-CM | POA: Diagnosis not present

## 2020-03-28 DIAGNOSIS — E119 Type 2 diabetes mellitus without complications: Secondary | ICD-10-CM | POA: Diagnosis not present

## 2020-03-28 DIAGNOSIS — C3491 Malignant neoplasm of unspecified part of right bronchus or lung: Secondary | ICD-10-CM

## 2020-03-28 DIAGNOSIS — Z5111 Encounter for antineoplastic chemotherapy: Secondary | ICD-10-CM | POA: Diagnosis not present

## 2020-03-28 DIAGNOSIS — Z791 Long term (current) use of non-steroidal anti-inflammatories (NSAID): Secondary | ICD-10-CM | POA: Diagnosis not present

## 2020-03-28 LAB — CMP (CANCER CENTER ONLY)
ALT: 18 U/L (ref 0–44)
AST: 14 U/L — ABNORMAL LOW (ref 15–41)
Albumin: 3.4 g/dL — ABNORMAL LOW (ref 3.5–5.0)
Alkaline Phosphatase: 140 U/L — ABNORMAL HIGH (ref 38–126)
Anion gap: 8 (ref 5–15)
BUN: 18 mg/dL (ref 6–20)
CO2: 26 mmol/L (ref 22–32)
Calcium: 9.1 mg/dL (ref 8.9–10.3)
Chloride: 101 mmol/L (ref 98–111)
Creatinine: 1.26 mg/dL — ABNORMAL HIGH (ref 0.61–1.24)
GFR, Est AFR Am: >60 mL/min (ref >60)
GFR, Estimated: >60 mL/min (ref >60)
Glucose, Bld: 216 mg/dL — ABNORMAL HIGH (ref 70–99)
Potassium: 4.3 mmol/L (ref 3.5–5.1)
Sodium: 135 mmol/L (ref 135–145)
Total Bilirubin: 0.3 mg/dL (ref 0.3–1.2)
Total Protein: 7.1 g/dL (ref 6.5–8.1)

## 2020-03-28 LAB — CBC WITH DIFFERENTIAL (CANCER CENTER ONLY)
Abs Immature Granulocytes: 0.03 10*3/uL (ref 0.00–0.07)
Basophils Absolute: 0.1 10*3/uL (ref 0.0–0.1)
Basophils Relative: 1 %
Eosinophils Absolute: 0.4 10*3/uL (ref 0.0–0.5)
Eosinophils Relative: 4 %
HCT: 36.9 % — ABNORMAL LOW (ref 39.0–52.0)
Hemoglobin: 12.1 g/dL — ABNORMAL LOW (ref 13.0–17.0)
Immature Granulocytes: 0 %
Lymphocytes Relative: 21 %
Lymphs Abs: 1.9 10*3/uL (ref 0.7–4.0)
MCH: 27.5 pg (ref 26.0–34.0)
MCHC: 32.8 g/dL (ref 30.0–36.0)
MCV: 83.9 fL (ref 80.0–100.0)
Monocytes Absolute: 0.9 10*3/uL (ref 0.1–1.0)
Monocytes Relative: 10 %
Neutro Abs: 5.6 10*3/uL (ref 1.7–7.7)
Neutrophils Relative %: 64 %
Platelet Count: 235 10*3/uL (ref 150–400)
RBC: 4.4 MIL/uL (ref 4.22–5.81)
RDW: 13.5 % (ref 11.5–15.5)
WBC Count: 8.9 10*3/uL (ref 4.0–10.5)
nRBC: 0 % (ref 0.0–0.2)

## 2020-03-28 NOTE — Telephone Encounter (Signed)
Returned call and LVM to call me back.

## 2020-03-28 NOTE — Telephone Encounter (Signed)
Dr Stephan Minister, DDS recommends referring pt to Dr Allyson Sabal at [facility] or [facility].   [facility] cannot see him for 2-3 weeks and they have had success getting pt in quickly at [facility]. LVM at [facility].  Pt notified. I told him I am waiting on a call from Dr Allyson Sabal.  Pt said Antibiotic is helping with dental pain .

## 2020-03-31 ENCOUNTER — Other Ambulatory Visit: Payer: BC Managed Care – PPO

## 2020-03-31 ENCOUNTER — Inpatient Hospital Stay: Payer: BC Managed Care – PPO

## 2020-03-31 ENCOUNTER — Other Ambulatory Visit: Payer: Self-pay

## 2020-03-31 ENCOUNTER — Ambulatory Visit
Admission: RE | Admit: 2020-03-31 | Discharge: 2020-03-31 | Disposition: A | Discharge status: Home / Self Care | Payer: BC Managed Care – PPO | Source: Ambulatory Visit | Attending: Radiation Oncology | Admitting: Radiation Oncology

## 2020-03-31 VITALS — BP 121/71 | HR 72 | Temp 97.7°F | Resp 18 | Wt 233.8 lb

## 2020-03-31 DIAGNOSIS — E7800 Pure hypercholesterolemia, unspecified: Secondary | ICD-10-CM | POA: Diagnosis not present

## 2020-03-31 DIAGNOSIS — Z5111 Encounter for antineoplastic chemotherapy: Secondary | ICD-10-CM | POA: Diagnosis not present

## 2020-03-31 DIAGNOSIS — J45909 Unspecified asthma, uncomplicated: Secondary | ICD-10-CM | POA: Diagnosis not present

## 2020-03-31 DIAGNOSIS — Z791 Long term (current) use of non-steroidal anti-inflammatories (NSAID): Secondary | ICD-10-CM | POA: Diagnosis not present

## 2020-03-31 DIAGNOSIS — C3411 Malignant neoplasm of upper lobe, right bronchus or lung: Secondary | ICD-10-CM

## 2020-03-31 DIAGNOSIS — Z7982 Long term (current) use of aspirin: Secondary | ICD-10-CM | POA: Diagnosis not present

## 2020-03-31 DIAGNOSIS — Z510 Encounter for antineoplastic radiation therapy: Secondary | ICD-10-CM | POA: Diagnosis not present

## 2020-03-31 DIAGNOSIS — Z79899 Other long term (current) drug therapy: Secondary | ICD-10-CM | POA: Diagnosis not present

## 2020-03-31 DIAGNOSIS — Z87442 Personal history of urinary calculi: Secondary | ICD-10-CM | POA: Diagnosis not present

## 2020-03-31 DIAGNOSIS — E119 Type 2 diabetes mellitus without complications: Secondary | ICD-10-CM | POA: Diagnosis not present

## 2020-03-31 DIAGNOSIS — K219 Gastro-esophageal reflux disease without esophagitis: Secondary | ICD-10-CM | POA: Diagnosis not present

## 2020-03-31 DIAGNOSIS — Z7984 Long term (current) use of oral hypoglycemic drugs: Secondary | ICD-10-CM | POA: Diagnosis not present

## 2020-03-31 MED ORDER — FAMOTIDINE IN NACL 20-0.9 MG/50ML-% IV SOLN
INTRAVENOUS | Status: AC
  Filled 2020-03-31: qty 50

## 2020-03-31 MED ORDER — DIPHENHYDRAMINE HCL 50 MG/ML IJ SOLN
50.0000 mg | Freq: Once | INTRAMUSCULAR | Status: AC
  Administered 2020-03-31: 50 mg via INTRAVENOUS

## 2020-03-31 MED ORDER — PALONOSETRON HCL INJECTION 0.25 MG/5ML
0.2500 mg | Freq: Once | INTRAVENOUS | Status: AC
  Administered 2020-03-31: 0.25 mg via INTRAVENOUS

## 2020-03-31 MED ORDER — SODIUM CHLORIDE 0.9 % IV SOLN
20.0000 mg | Freq: Once | INTRAVENOUS | Status: AC
  Administered 2020-03-31: 20 mg via INTRAVENOUS
  Filled 2020-03-31: qty 20

## 2020-03-31 MED ORDER — FAMOTIDINE IN NACL 20-0.9 MG/50ML-% IV SOLN
20.0000 mg | Freq: Once | INTRAVENOUS | Status: AC
  Administered 2020-03-31: 20 mg via INTRAVENOUS

## 2020-03-31 MED ORDER — DIPHENHYDRAMINE HCL 50 MG/ML IJ SOLN
INTRAMUSCULAR | Status: AC
  Filled 2020-03-31: qty 1

## 2020-03-31 MED ORDER — SODIUM CHLORIDE 0.9 % IV SOLN
Freq: Once | INTRAVENOUS | Status: AC
  Filled 2020-03-31: qty 250

## 2020-03-31 MED ORDER — PALONOSETRON HCL INJECTION 0.25 MG/5ML
INTRAVENOUS | Status: AC
  Filled 2020-03-31: qty 5

## 2020-03-31 MED ORDER — SODIUM CHLORIDE 0.9 % IV SOLN
45.0000 mg/m2 | Freq: Once | INTRAVENOUS | Status: AC
  Administered 2020-03-31: 102 mg via INTRAVENOUS
  Filled 2020-03-31: qty 17

## 2020-03-31 MED ORDER — SODIUM CHLORIDE 0.9 % IV SOLN
240.4000 mg | Freq: Once | INTRAVENOUS | Status: AC
  Administered 2020-03-31: 240 mg via INTRAVENOUS
  Filled 2020-03-31: qty 24

## 2020-03-31 NOTE — Patient Instructions (Signed)
[facility] Discharge Instructions for Patients Receiving Chemotherapy  Today you received the following chemotherapy agents: Paclitaxel, Carboplatin  To help prevent nausea and vomiting after your treatment, we encourage you to take your nausea medication as directed.   If you develop nausea and vomiting that is not controlled by your nausea medication, call the clinic.   BELOW ARE SYMPTOMS THAT SHOULD BE REPORTED IMMEDIATELY:  *FEVER GREATER THAN 100.5 F  *CHILLS WITH OR WITHOUT FEVER  NAUSEA AND VOMITING THAT IS NOT CONTROLLED WITH YOUR NAUSEA MEDICATION  *UNUSUAL SHORTNESS OF BREATH  *UNUSUAL BRUISING OR BLEEDING  TENDERNESS IN MOUTH AND THROAT WITH OR WITHOUT PRESENCE OF ULCERS  *URINARY PROBLEMS  *BOWEL PROBLEMS  UNUSUAL RASH Items with * indicate a potential emergency and should be followed up as soon as possible.  Feel free to call the clinic should you have any questions or concerns. The clinic phone number is [phone].  Please show the [place] at check-in to the Emergency Department and triage nurse.  Paclitaxel injection What is this medicine? PACLITAXEL (PAK li TAX el) is a chemotherapy drug. It targets fast dividing cells, like cancer cells, and causes these cells to die. This medicine is used to treat ovarian cancer, breast cancer, lung cancer, Kaposi's sarcoma, and other cancers. This medicine may be used for other purposes; ask your health care provider or pharmacist if you have questions. COMMON BRAND NAME(S): Onxol, Taxol What should I tell my health care provider before I take this medicine? They need to know if you have any of these conditions:  history of irregular heartbeat  liver disease  low blood counts, like low white cell, platelet, or red cell counts  lung or breathing disease, like asthma  tingling of the fingers or toes, or other nerve disorder  an unusual or allergic reaction to paclitaxel, alcohol,  polyoxyethylated castor oil, other chemotherapy, other medicines, foods, dyes, or preservatives  pregnant or trying to get pregnant  breast-feeding How should I use this medicine? This drug is given as an infusion into a vein. It is administered in a hospital or clinic by a specially trained health care professional. Talk to your pediatrician regarding the use of this medicine in children. Special care may be needed. Overdosage: If you think you have taken too much of this medicine contact a poison control center or emergency room at once. NOTE: This medicine is only for you. Do not share this medicine with others. What if I miss a dose? It is important not to miss your dose. Call your doctor or health care professional if you are unable to keep an appointment. What may interact with this medicine? Do not take this medicine with any of the following medications:  disulfiram  metronidazole This medicine may also interact with the following medications:  antiviral medicines for hepatitis, HIV or AIDS  certain antibiotics like erythromycin and clarithromycin  certain medicines for fungal infections like ketoconazole and itraconazole  certain medicines for seizures like carbamazepine, phenobarbital, phenytoin  gemfibrozil  nefazodone  rifampin  St. John's wort This list may not describe all possible interactions. Give your health care provider a list of all the medicines, herbs, non-prescription drugs, or dietary supplements you use. Also tell them if you smoke, drink alcohol, or use illegal drugs. Some items may interact with your medicine. What should I watch for while using this medicine? Your condition will be monitored carefully while you are receiving this medicine. You will need important blood  work done while you are taking this medicine. This medicine can cause serious allergic reactions. To reduce your risk you will need to take other medicine(s) before treatment with this  medicine. If you experience allergic reactions like skin rash, itching or hives, swelling of the face, lips, or tongue, tell your doctor or health care professional right away. In some cases, you may be given additional medicines to help with side effects. Follow all directions for their use. This drug may make you feel generally unwell. This is not uncommon, as chemotherapy can affect healthy cells as well as cancer cells. Report any side effects. Continue your course of treatment even though you feel ill unless your doctor tells you to stop. Call your doctor or health care professional for advice if you get a fever, chills or sore throat, or other symptoms of a cold or flu. Do not treat yourself. This drug decreases your body's ability to fight infections. Try to avoid being around people who are sick. This medicine may increase your risk to bruise or bleed. Call your doctor or health care professional if you notice any unusual bleeding. Be careful brushing and flossing your teeth or using a toothpick because you may get an infection or bleed more easily. If you have any dental work done, tell your dentist you are receiving this medicine. Avoid taking products that contain aspirin, acetaminophen, ibuprofen, naproxen, or ketoprofen unless instructed by your doctor. These medicines may hide a fever. Do not become pregnant while taking this medicine. Women should inform their doctor if they wish to become pregnant or think they might be pregnant. There is a potential for serious side effects to an unborn child. Talk to your health care professional or pharmacist for more information. Do not breast-feed an infant while taking this medicine. Men are advised not to father a child while receiving this medicine. This product may contain alcohol. Ask your pharmacist or healthcare provider if this medicine contains alcohol. Be sure to tell all healthcare providers you are taking this medicine. Certain medicines,  like metronidazole and disulfiram, can cause an unpleasant reaction when taken with alcohol. The reaction includes flushing, headache, nausea, vomiting, sweating, and increased thirst. The reaction can last from 30 minutes to several hours. What side effects may I notice from receiving this medicine? Side effects that you should report to your doctor or health care professional as soon as possible:  allergic reactions like skin rash, itching or hives, swelling of the face, lips, or tongue  breathing problems  changes in vision  fast, irregular heartbeat  high or low blood pressure  mouth sores  pain, tingling, numbness in the hands or feet  signs of decreased platelets or bleeding - bruising, pinpoint red spots on the skin, black, tarry stools, blood in the urine  signs of decreased red blood cells - unusually weak or tired, feeling faint or lightheaded, falls  signs of infection - fever or chills, cough, sore throat, pain or difficulty passing urine  signs and symptoms of liver injury like dark yellow or brown urine; general ill feeling or flu-like symptoms; light-colored stools; loss of appetite; nausea; right upper belly pain; unusually weak or tired; yellowing of the eyes or skin  swelling of the ankles, feet, hands  unusually slow heartbeat Side effects that usually do not require medical attention (report to your doctor or health care professional if they continue or are bothersome):  diarrhea  hair loss  loss of appetite  muscle or joint pain  nausea, vomiting  pain, redness, or irritation at site where injected  tiredness This list may not describe all possible side effects. Call your doctor for medical advice about side effects. You may report side effects to FDA at 1-800-FDA-1088. Where should I keep my medicine? This drug is given in a hospital or clinic and will not be stored at home. NOTE: This sheet is a summary. It may not cover all possible information.  If you have questions about this medicine, talk to your doctor, pharmacist, or health care provider.  2020 Elsevier/Gold Standard (2017-07-05 13:14:55)  Carboplatin injection What is this medicine? CARBOPLATIN (KAR boe pla tin) is a chemotherapy drug. It targets fast dividing cells, like cancer cells, and causes these cells to die. This medicine is used to treat ovarian cancer and many other cancers. This medicine may be used for other purposes; ask your health care provider or pharmacist if you have questions. COMMON BRAND NAME(S): Paraplatin What should I tell my health care provider before I take this medicine? They need to know if you have any of these conditions:  blood disorders  hearing problems  kidney disease  recent or ongoing radiation therapy  an unusual or allergic reaction to carboplatin, cisplatin, other chemotherapy, other medicines, foods, dyes, or preservatives  pregnant or trying to get pregnant  breast-feeding How should I use this medicine? This drug is usually given as an infusion into a vein. It is administered in a hospital or clinic by a specially trained health care professional. Talk to your pediatrician regarding the use of this medicine in children. Special care may be needed. Overdosage: If you think you have taken too much of this medicine contact a poison control center or emergency room at once. NOTE: This medicine is only for you. Do not share this medicine with others. What if I miss a dose? It is important not to miss a dose. Call your doctor or health care professional if you are unable to keep an appointment. What may interact with this medicine?  medicines for seizures  medicines to increase blood counts like filgrastim, pegfilgrastim, sargramostim  some antibiotics like amikacin, gentamicin, neomycin, streptomycin, tobramycin  vaccines Talk to your doctor or health care professional before taking any of these  medicines:  acetaminophen  aspirin  ibuprofen  ketoprofen  naproxen This list may not describe all possible interactions. Give your health care provider a list of all the medicines, herbs, non-prescription drugs, or dietary supplements you use. Also tell them if you smoke, drink alcohol, or use illegal drugs. Some items may interact with your medicine. What should I watch for while using this medicine? Your condition will be monitored carefully while you are receiving this medicine. You will need important blood work done while you are taking this medicine. This drug may make you feel generally unwell. This is not uncommon, as chemotherapy can affect healthy cells as well as cancer cells. Report any side effects. Continue your course of treatment even though you feel ill unless your doctor tells you to stop. In some cases, you may be given additional medicines to help with side effects. Follow all directions for their use. Call your doctor or health care professional for advice if you get a fever, chills or sore throat, or other symptoms of a cold or flu. Do not treat yourself. This drug decreases your body's ability to fight infections. Try to avoid being around people who are sick. This medicine may increase your risk to bruise or bleed.  Call your doctor or health care professional if you notice any unusual bleeding. Be careful brushing and flossing your teeth or using a toothpick because you may get an infection or bleed more easily. If you have any dental work done, tell your dentist you are receiving this medicine. Avoid taking products that contain aspirin, acetaminophen, ibuprofen, naproxen, or ketoprofen unless instructed by your doctor. These medicines may hide a fever. Do not become pregnant while taking this medicine. Women should inform their doctor if they wish to become pregnant or think they might be pregnant. There is a potential for serious side effects to an unborn child. Talk  to your health care professional or pharmacist for more information. Do not breast-feed an infant while taking this medicine. What side effects may I notice from receiving this medicine? Side effects that you should report to your doctor or health care professional as soon as possible:  allergic reactions like skin rash, itching or hives, swelling of the face, lips, or tongue  signs of infection - fever or chills, cough, sore throat, pain or difficulty passing urine  signs of decreased platelets or bleeding - bruising, pinpoint red spots on the skin, black, tarry stools, nosebleeds  signs of decreased red blood cells - unusually weak or tired, fainting spells, lightheadedness  breathing problems  changes in hearing  changes in vision  chest pain  high blood pressure  low blood counts - This drug may decrease the number of white blood cells, red blood cells and platelets. You may be at increased risk for infections and bleeding.  nausea and vomiting  pain, swelling, redness or irritation at the injection site  pain, tingling, numbness in the hands or feet  problems with balance, talking, walking  trouble passing urine or change in the amount of urine Side effects that usually do not require medical attention (report to your doctor or health care professional if they continue or are bothersome):  hair loss  loss of appetite  metallic taste in the mouth or changes in taste This list may not describe all possible side effects. Call your doctor for medical advice about side effects. You may report side effects to FDA at 1-800-FDA-1088. Where should I keep my medicine? This drug is given in a hospital or clinic and will not be stored at home. NOTE: This sheet is a summary. It may not cover all possible information. If you have questions about this medicine, talk to your doctor, pharmacist, or health care provider.  2020 Elsevier/Gold Standard (2008-02-06 14:38:05)

## 2020-03-31 NOTE — Progress Notes (Signed)
Per Dr. Julien Nordmann, adjust carboplatin dose today to 240mg  (AUC=2) for increased SCr.   Demetrius Charity, PharmD, BCPS, [facility] Pharmacist Pharmacy Phone: [phone] 03/31/2020

## 2020-04-01 ENCOUNTER — Ambulatory Visit
Admission: RE | Admit: 2020-04-01 | Discharge: 2020-04-01 | Disposition: A | Discharge status: Home / Self Care | Payer: BC Managed Care – PPO | Source: Ambulatory Visit | Attending: Radiation Oncology | Admitting: Radiation Oncology

## 2020-04-01 ENCOUNTER — Telehealth: Payer: Self-pay | Admitting: *Deleted

## 2020-04-01 ENCOUNTER — Other Ambulatory Visit: Payer: Self-pay

## 2020-04-01 DIAGNOSIS — Z510 Encounter for antineoplastic radiation therapy: Secondary | ICD-10-CM | POA: Diagnosis not present

## 2020-04-01 DIAGNOSIS — C3411 Malignant neoplasm of upper lobe, right bronchus or lung: Secondary | ICD-10-CM | POA: Diagnosis not present

## 2020-04-01 NOTE — Telephone Encounter (Signed)
-----   Message from Scot Dock, RN sent at 03/31/2020  2:00 PM EDT ----- Regarding: Dr. Julien Nordmann 1st time chemo follow up

## 2020-04-01 NOTE — Telephone Encounter (Signed)
Tried both pt & wife's ph. # to see how he did with his treatment yest.  Unable to reach pt but left message on wife's # to call us back.

## 2020-04-02 ENCOUNTER — Ambulatory Visit
Admission: RE | Admit: 2020-04-02 | Discharge: 2020-04-02 | Disposition: A | Discharge status: Home / Self Care | Payer: BC Managed Care – PPO | Source: Ambulatory Visit | Attending: Radiation Oncology | Admitting: Radiation Oncology

## 2020-04-02 ENCOUNTER — Other Ambulatory Visit: Payer: Self-pay

## 2020-04-02 DIAGNOSIS — C3411 Malignant neoplasm of upper lobe, right bronchus or lung: Secondary | ICD-10-CM | POA: Diagnosis not present

## 2020-04-02 DIAGNOSIS — Z510 Encounter for antineoplastic radiation therapy: Secondary | ICD-10-CM | POA: Diagnosis not present

## 2020-04-03 ENCOUNTER — Ambulatory Visit
Admission: RE | Admit: 2020-04-03 | Discharge: 2020-04-03 | Disposition: A | Discharge status: Home / Self Care | Payer: BC Managed Care – PPO | Source: Ambulatory Visit | Attending: Radiation Oncology | Admitting: Radiation Oncology

## 2020-04-03 ENCOUNTER — Other Ambulatory Visit: Payer: Self-pay

## 2020-04-03 DIAGNOSIS — Z510 Encounter for antineoplastic radiation therapy: Secondary | ICD-10-CM | POA: Diagnosis not present

## 2020-04-03 DIAGNOSIS — C3411 Malignant neoplasm of upper lobe, right bronchus or lung: Secondary | ICD-10-CM | POA: Diagnosis not present

## 2020-04-04 ENCOUNTER — Other Ambulatory Visit: Payer: Self-pay

## 2020-04-04 ENCOUNTER — Ambulatory Visit
Admission: RE | Admit: 2020-04-04 | Discharge: 2020-04-04 | Disposition: A | Discharge status: Home / Self Care | Payer: BC Managed Care – PPO | Source: Ambulatory Visit | Attending: Radiation Oncology | Admitting: Radiation Oncology

## 2020-04-04 ENCOUNTER — Ambulatory Visit: Payer: BC Managed Care – PPO | Admitting: Adult Health

## 2020-04-04 DIAGNOSIS — C3411 Malignant neoplasm of upper lobe, right bronchus or lung: Secondary | ICD-10-CM

## 2020-04-04 DIAGNOSIS — Z510 Encounter for antineoplastic radiation therapy: Secondary | ICD-10-CM | POA: Diagnosis not present

## 2020-04-04 MED ORDER — SONAFINE EX EMUL
1.0000 "application " | Freq: Once | CUTANEOUS | Status: AC
  Administered 2020-04-04: 1 via TOPICAL

## 2020-04-04 NOTE — Progress Notes (Signed)

## 2020-04-07 ENCOUNTER — Other Ambulatory Visit: Payer: Self-pay

## 2020-04-07 ENCOUNTER — Inpatient Hospital Stay: Payer: BC Managed Care – PPO

## 2020-04-07 ENCOUNTER — Ambulatory Visit
Admission: RE | Admit: 2020-04-07 | Discharge: 2020-04-07 | Disposition: A | Discharge status: Home / Self Care | Payer: BC Managed Care – PPO | Source: Ambulatory Visit | Attending: Radiation Oncology | Admitting: Radiation Oncology

## 2020-04-07 ENCOUNTER — Other Ambulatory Visit: Payer: Self-pay | Admitting: Internal Medicine

## 2020-04-07 ENCOUNTER — Other Ambulatory Visit: Payer: Self-pay | Admitting: Medical Oncology

## 2020-04-07 ENCOUNTER — Inpatient Hospital Stay: Payer: BC Managed Care – PPO | Admitting: Internal Medicine

## 2020-04-07 ENCOUNTER — Encounter: Payer: Self-pay | Admitting: Internal Medicine

## 2020-04-07 VITALS — BP 108/70 | HR 95 | Temp 97.9°F | Resp 17 | Ht 72.0 in | Wt 236.5 lb

## 2020-04-07 DIAGNOSIS — Z7982 Long term (current) use of aspirin: Secondary | ICD-10-CM | POA: Diagnosis not present

## 2020-04-07 DIAGNOSIS — C3411 Malignant neoplasm of upper lobe, right bronchus or lung: Secondary | ICD-10-CM

## 2020-04-07 DIAGNOSIS — Z510 Encounter for antineoplastic radiation therapy: Secondary | ICD-10-CM | POA: Diagnosis not present

## 2020-04-07 DIAGNOSIS — E119 Type 2 diabetes mellitus without complications: Secondary | ICD-10-CM | POA: Diagnosis not present

## 2020-04-07 DIAGNOSIS — K219 Gastro-esophageal reflux disease without esophagitis: Secondary | ICD-10-CM | POA: Diagnosis not present

## 2020-04-07 DIAGNOSIS — Z5111 Encounter for antineoplastic chemotherapy: Secondary | ICD-10-CM | POA: Diagnosis not present

## 2020-04-07 DIAGNOSIS — Z7984 Long term (current) use of oral hypoglycemic drugs: Secondary | ICD-10-CM | POA: Diagnosis not present

## 2020-04-07 DIAGNOSIS — J45909 Unspecified asthma, uncomplicated: Secondary | ICD-10-CM | POA: Diagnosis not present

## 2020-04-07 DIAGNOSIS — E7800 Pure hypercholesterolemia, unspecified: Secondary | ICD-10-CM | POA: Diagnosis not present

## 2020-04-07 DIAGNOSIS — Z87442 Personal history of urinary calculi: Secondary | ICD-10-CM | POA: Diagnosis not present

## 2020-04-07 DIAGNOSIS — Z791 Long term (current) use of non-steroidal anti-inflammatories (NSAID): Secondary | ICD-10-CM | POA: Diagnosis not present

## 2020-04-07 DIAGNOSIS — Z79899 Other long term (current) drug therapy: Secondary | ICD-10-CM | POA: Diagnosis not present

## 2020-04-07 LAB — CBC WITH DIFFERENTIAL (CANCER CENTER ONLY)
Abs Immature Granulocytes: 0.04 10*3/uL (ref 0.00–0.07)
Basophils Absolute: 0.1 10*3/uL (ref 0.0–0.1)
Basophils Relative: 1 %
Eosinophils Absolute: 0.3 10*3/uL (ref 0.0–0.5)
Eosinophils Relative: 4 %
HCT: 36.8 % — ABNORMAL LOW (ref 39.0–52.0)
Hemoglobin: 11.9 g/dL — ABNORMAL LOW (ref 13.0–17.0)
Immature Granulocytes: 1 %
Lymphocytes Relative: 18 %
Lymphs Abs: 1.3 10*3/uL (ref 0.7–4.0)
MCH: 27 pg (ref 26.0–34.0)
MCHC: 32.3 g/dL (ref 30.0–36.0)
MCV: 83.4 fL (ref 80.0–100.0)
Monocytes Absolute: 0.7 10*3/uL (ref 0.1–1.0)
Monocytes Relative: 10 %
Neutro Abs: 4.8 10*3/uL (ref 1.7–7.7)
Neutrophils Relative %: 66 %
Platelet Count: 218 10*3/uL (ref 150–400)
RBC: 4.41 MIL/uL (ref 4.22–5.81)
RDW: 13.9 % (ref 11.5–15.5)
WBC Count: 7.2 10*3/uL (ref 4.0–10.5)
nRBC: 0 % (ref 0.0–0.2)

## 2020-04-07 LAB — CMP (CANCER CENTER ONLY)
ALT: 15 U/L (ref 0–44)
AST: 10 U/L — ABNORMAL LOW (ref 15–41)
Albumin: 3.1 g/dL — ABNORMAL LOW (ref 3.5–5.0)
Alkaline Phosphatase: 128 U/L — ABNORMAL HIGH (ref 38–126)
Anion gap: 9 (ref 5–15)
BUN: 20 mg/dL (ref 6–20)
CO2: 23 mmol/L (ref 22–32)
Calcium: 9.4 mg/dL (ref 8.9–10.3)
Chloride: 104 mmol/L (ref 98–111)
Creatinine: 1.23 mg/dL (ref 0.61–1.24)
GFR, Est AFR Am: >60 mL/min (ref >60)
GFR, Estimated: >60 mL/min (ref >60)
Glucose, Bld: 217 mg/dL — ABNORMAL HIGH (ref 70–99)
Potassium: 4.7 mmol/L (ref 3.5–5.1)
Sodium: 136 mmol/L (ref 135–145)
Total Bilirubin: 0.3 mg/dL (ref 0.3–1.2)
Total Protein: 7.2 g/dL (ref 6.5–8.1)

## 2020-04-07 MED ORDER — SODIUM CHLORIDE 0.9 % IV SOLN
20.0000 mg | Freq: Once | INTRAVENOUS | Status: AC
  Administered 2020-04-07: 20 mg via INTRAVENOUS
  Filled 2020-04-07: qty 20

## 2020-04-07 MED ORDER — PALONOSETRON HCL INJECTION 0.25 MG/5ML
INTRAVENOUS | Status: AC
  Filled 2020-04-07: qty 5

## 2020-04-07 MED ORDER — PALONOSETRON HCL INJECTION 0.25 MG/5ML
0.2500 mg | Freq: Once | INTRAVENOUS | Status: AC
  Administered 2020-04-07: 0.25 mg via INTRAVENOUS

## 2020-04-07 MED ORDER — SODIUM CHLORIDE 0.9 % IV SOLN
Freq: Once | INTRAVENOUS | Status: AC
  Filled 2020-04-07: qty 250

## 2020-04-07 MED ORDER — DIPHENHYDRAMINE HCL 50 MG/ML IJ SOLN
50.0000 mg | Freq: Once | INTRAMUSCULAR | Status: AC
  Administered 2020-04-07: 50 mg via INTRAVENOUS

## 2020-04-07 MED ORDER — SODIUM CHLORIDE 0.9 % IV SOLN
240.0000 mg | Freq: Once | INTRAVENOUS | Status: AC
  Administered 2020-04-07: 240 mg via INTRAVENOUS
  Filled 2020-04-07: qty 24

## 2020-04-07 MED ORDER — FAMOTIDINE IN NACL 20-0.9 MG/50ML-% IV SOLN
INTRAVENOUS | Status: AC
  Filled 2020-04-07: qty 50

## 2020-04-07 MED ORDER — FAMOTIDINE IN NACL 20-0.9 MG/50ML-% IV SOLN
20.0000 mg | Freq: Once | INTRAVENOUS | Status: AC
  Administered 2020-04-07: 20 mg via INTRAVENOUS

## 2020-04-07 MED ORDER — DIPHENHYDRAMINE HCL 50 MG/ML IJ SOLN
INTRAMUSCULAR | Status: AC
  Filled 2020-04-07: qty 1

## 2020-04-07 MED ORDER — SODIUM CHLORIDE 0.9 % IV SOLN
45.0000 mg/m2 | Freq: Once | INTRAVENOUS | Status: AC
  Administered 2020-04-07: 102 mg via INTRAVENOUS
  Filled 2020-04-07: qty 17

## 2020-04-07 NOTE — Patient Instructions (Signed)
Steps to Quit Smoking Smoking tobacco is the leading cause of preventable death. It can affect almost every organ in the body. Smoking puts you and people around you at risk for many serious, long-lasting (chronic) diseases. Quitting smoking can be hard, but it is one of the best things that you can do for your health. It is never too late to quit. How do I get ready to quit? When you decide to quit smoking, make a plan to help you succeed. Before you quit:  Pick a date to quit. Set a date within the next 2 weeks to give you time to prepare.  Write down the reasons why you are quitting. Keep this list in places where you will see it often.  Tell your family, friends, and co-workers that you are quitting. Their support is important.  Talk with your doctor about the choices that may help you quit.  Find out if your health insurance will pay for these treatments.  Know the people, places, things, and activities that make you want to smoke (triggers). Avoid them. What first steps can I take to quit smoking?  Throw away all cigarettes at home, at work, and in your car.  Throw away the things that you use when you smoke, such as ashtrays and lighters.  Clean your car. Make sure to empty the ashtray.  Clean your home, including curtains and carpets. What can I do to help me quit smoking? Talk with your doctor about taking medicines and seeing a counselor at the same time. You are more likely to succeed when you do both.  If you are pregnant or breastfeeding, talk with your doctor about counseling or other ways to quit smoking. Do not take medicine to help you quit smoking unless your doctor tells you to do so. To quit smoking: Quit right away  Quit smoking totally, instead of slowly cutting back on how much you smoke over a period of time.  Go to counseling. You are more likely to quit if you go to counseling sessions regularly. Take medicine You may take medicines to help you quit. Some  medicines need a prescription, and some you can buy over-the-counter. Some medicines may contain a drug called nicotine to replace the nicotine in cigarettes. Medicines may:  Help you to stop having the desire to smoke (cravings).  Help to stop the problems that come when you stop smoking (withdrawal symptoms). Your doctor may ask you to use:  Nicotine patches, gum, or lozenges.  Nicotine inhalers or sprays.  Non-nicotine medicine that is taken by mouth. Find resources Find resources and other ways to help you quit smoking and remain smoke-free after you quit. These resources are most helpful when you use them often. They include:  Online chats with a counselor.  Phone quitlines.  Printed self-help materials.  Support groups or group counseling.  Text messaging programs.  Mobile phone apps. Use apps on your mobile phone or tablet that can help you stick to your quit plan. There are many free apps for mobile phones and tablets as well as websites. Examples include Quit Guide from the CDC and smokefree.gov  What things can I do to make it easier to quit?   Talk to your family and friends. Ask them to support and encourage you.  Call a phone quitline (1-800-QUIT-NOW), reach out to support groups, or work with a counselor.  Ask people who smoke to not smoke around you.  Avoid places that make you want to smoke,   such as: ? Bars. ? Parties. ? Smoke-break areas at work.  Spend time with people who do not smoke.  Lower the stress in your life. Stress can make you want to smoke. Try these things to help your stress: ? Getting regular exercise. ? Doing deep-breathing exercises. ? Doing yoga. ? Meditating. ? Doing a body scan. To do this, close your eyes, focus on one area of your body at a time from head to toe. Notice which parts of your body are tense. Try to relax the muscles in those areas. How will I feel when I quit smoking? Day 1 to 3 weeks Within the first 24 hours,  you may start to have some problems that come from quitting tobacco. These problems are very bad 2-3 days after you quit, but they do not often last for more than 2-3 weeks. You may get these symptoms:  Mood swings.  Feeling restless, nervous, angry, or annoyed.  Trouble concentrating.  Dizziness.  Strong desire for high-sugar foods and nicotine.  Weight gain.  Trouble pooping (constipation).  Feeling like you may vomit (nausea).  Coughing or a sore throat.  Changes in how the medicines that you take for other issues work in your body.  Depression.  Trouble sleeping (insomnia). Week 3 and afterward After the first 2-3 weeks of quitting, you may start to notice more positive results, such as:  Better sense of smell and taste.  Less coughing and sore throat.  Slower heart rate.  Lower blood pressure.  Clearer skin.  Better breathing.  Fewer sick days. Quitting smoking can be hard. Do not give up if you fail the first time. Some people need to try a few times before they succeed. Do your best to stick to your quit plan, and talk with your doctor if you have any questions or concerns. Summary  Smoking tobacco is the leading cause of preventable death. Quitting smoking can be hard, but it is one of the best things that you can do for your health.  When you decide to quit smoking, make a plan to help you succeed.  Quit smoking right away, not slowly over a period of time.  When you start quitting, seek help from your doctor, family, or friends. This information is not intended to replace advice given to you by your health care provider. Make sure you discuss any questions you have with your health care provider. Document Revised: 07/27/2019 Document Reviewed: 01/20/2019 Elsevier Patient Education  2020 Elsevier Inc.  

## 2020-04-07 NOTE — Patient Instructions (Signed)
[facility] Discharge Instructions for Patients Receiving Chemotherapy  Today you received the following chemotherapy agents Taxol, Carboplatin  To help prevent nausea and vomiting after your treatment, we encourage you to take your nausea medication as directed  If you develop nausea and vomiting that is not controlled by your nausea medication, call the clinic.   BELOW ARE SYMPTOMS THAT SHOULD BE REPORTED IMMEDIATELY:  *FEVER GREATER THAN 100.5 F  *CHILLS WITH OR WITHOUT FEVER  NAUSEA AND VOMITING THAT IS NOT CONTROLLED WITH YOUR NAUSEA MEDICATION  *UNUSUAL SHORTNESS OF BREATH  *UNUSUAL BRUISING OR BLEEDING  TENDERNESS IN MOUTH AND THROAT WITH OR WITHOUT PRESENCE OF ULCERS  *URINARY PROBLEMS  *BOWEL PROBLEMS  UNUSUAL RASH Items with * indicate a potential emergency and should be followed up as soon as possible.  Feel free to call the clinic should you have any questions or concerns. The clinic phone number is [phone].  Please show the CHEMO ALERT CARD at check-in to the Emergency Department and triage nurse.   

## 2020-04-07 NOTE — Progress Notes (Signed)
[place] [phone]   [phone]  OFFICE PROGRESS NOTE  [name], DO [address] [place] [address]  DIAGNOSIS: Stage IIIA (T2a, N2, M0) non-small cell lung cancer, squamous cell carcinoma diagnosed in April 2021, presented with right upper lobe/suprahilar lung mass in addition to right paratracheal and subcarinal lymphadenopathy.  PRIOR THERAPY:None.  CURRENT THERAPY: Concurrent chemoradiation with weekly carboplatin for AUC of 2 and paclitaxel 45 NG/M2.  First dose on Mar 31, 2020.  Status post 1 cycle.  INTERVAL HISTORY: William Farrell 59 y.o. male returns to the clinic today for follow-up visit accompanied by his wife.  The patient is feeling fine today with no concerning complaints except for mild cough and mild wheezing.  He tolerated the first week of his treatment fairly well with no concerning adverse effects.  The patient denied having any current chest pain, shortness of breath or hemoptysis.  He denied having any fever or chills.  He has no nausea, vomiting, diarrhea or constipation.  He denied having any headache or visual changes.  Is here today for evaluation before starting cycle #2 of his treatment.  MEDICAL HISTORY: Past Medical History:  Diagnosis Date  . Asthma    as a child  . Diabetes ([place])   . Diverticulitis   . GERD (gastroesophageal reflux disease)   . High cholesterol   . History of kidney stones     ALLERGIES:  is allergic to sitagliptin.  MEDICATIONS:  Current Outpatient Medications  Medication Sig Dispense Refill  . acetaminophen (TYLENOL) 500 MG tablet Take 1,000 mg by mouth every 6 (six) hours as needed for moderate pain or headache.    . albuterol (VENTOLIN HFA) 108 (90 Base) MCG/ACT inhaler Inhale 2 puffs into the lungs every 6 (six) hours as needed for wheezing or shortness of breath.     Marland Kitchen aspirin EC 81 MG tablet Take 81 mg by mouth daily.    Marland Kitchen atorvastatin (LIPITOR) 40  MG tablet Take 1 tablet (40 mg total) by mouth daily. 90 tablet 3  . calcium carbonate (TUMS - DOSED IN MG ELEMENTAL CALCIUM) 500 MG chewable tablet Chew 2 tablets by mouth daily as needed for indigestion or heartburn.    Marland Kitchen glipiZIDE (GLUCOTROL) 5 MG tablet Take 1 tablet (5 mg total) by mouth daily before breakfast. 90 tablet 3  . glucose blood test strip Use as instructed up to qid 100 each 99  . meloxicam (MOBIC) 15 MG tablet Take 1 tablet (15 mg total) by mouth daily. 90 tablet 3  . metFORMIN (GLUCOPHAGE) 1000 MG tablet Take 1 tablet (1,000 mg total) by mouth 2 (two) times daily with a meal. 180 tablet 3  . nicotine (NICODERM CQ - DOSED IN MG/24 HR) 7 mg/24hr patch Place 1 patch (7 mg total) onto the skin daily. 28 patch 0  . OneTouch Delica Lancets 95K MISC As directed up to qid (Patient not taking: Reported on 03/20/2020) 100 each 99  . prochlorperazine (COMPAZINE) 10 MG tablet Take 1 tablet (10 mg total) by mouth every 6 (six) hours as needed for nausea or vomiting. 30 tablet 0  . trolamine salicylate (ASPERCREME) 10 % cream Apply 1 application topically as needed for muscle pain.    . varenicline (CHANTIX STARTING MONTH PAK) 0.5 MG X 11 & 1 MG X 42 tablet Take one 0.5 mg tablet by mouth once daily for 3 days, then increase to one 0.5 mg tablet twice daily  for 4 days, then increase to one 1 mg tablet twice daily. 53 tablet 0  . varenicline (CHANTIX) 1 MG tablet Take 1 tablet (1 mg total) by mouth 2 (two) times daily. 180 tablet 1   No current facility-administered medications for this visit.    SURGICAL HISTORY:  Past Surgical History:  Procedure Laterality Date  . broken bone repair    . BRONCHIAL BIOPSY  03/11/2020   Procedure: BRONCHIAL BIOPSIES;  Surgeon: Garner Nash, DO;  Location: [facility];  Service: Pulmonary;;  . COLONOSCOPY    . CRYOTHERAPY  03/11/2020   Procedure: CRYOTHERAPY;  Surgeon: Garner Nash, DO;  Location: [name];  Service: Pulmonary;;  . FINE NEEDLE  ASPIRATION  03/11/2020   Procedure: FINE NEEDLE ASPIRATION (FNA) LINEAR;  Surgeon: Garner Nash, DO;  Location: [facility];  Service: Pulmonary;;  . KNEE ARTHROSCOPY WITH ANTERIOR CRUCIATE LIGAMENT (ACL) REPAIR     x 2  . VIDEO BRONCHOSCOPY WITH ENDOBRONCHIAL ULTRASOUND N/A 03/11/2020   Procedure: VIDEO BRONCHOSCOPY WITH ENDOBRONCHIAL ULTRASOUND;  Surgeon: Garner Nash, DO;  Location: [facility];  Service: Pulmonary;  Laterality: N/A;    REVIEW OF SYSTEMS:  A comprehensive review of systems was negative except for: Respiratory: positive for cough and wheezing   PHYSICAL EXAMINATION: General appearance: alert, cooperative and no distress Head: Normocephalic, without obvious abnormality, atraumatic Neck: no adenopathy, no JVD, supple, symmetrical, trachea midline and thyroid not enlarged, symmetric, no tenderness/mass/nodules Lymph nodes: Cervical, supraclavicular, and axillary nodes normal. Resp: clear to auscultation bilaterally Back: symmetric, no curvature. ROM normal. No CVA tenderness. Cardio: regular rate and rhythm, S1, S2 normal, no murmur, click, rub or gallop GI: soft, non-tender; bowel sounds normal; no masses,  no organomegaly Extremities: extremities normal, atraumatic, no cyanosis or edema  ECOG PERFORMANCE STATUS: 1 - Symptomatic but completely ambulatory  Blood pressure 108/70, pulse 95, temperature 97.9 F (36.6 C), temperature source Temporal, resp. rate 17, height 6' (1.829 m), weight 236 lb 8 oz (107.3 kg), SpO2 99 %.  LABORATORY DATA: Lab Results  Component Value Date   WBC 7.2 04/07/2020   HGB 11.9 (L) 04/07/2020   HCT 36.8 (L) 04/07/2020   MCV 83.4 04/07/2020   PLT 218 04/07/2020      Chemistry      Component Value Date/Time   NA 135 03/28/2020 1452   K 4.3 03/28/2020 1452   CL 101 03/28/2020 1452   CO2 26 03/28/2020 1452   BUN 18 03/28/2020 1452   CREATININE 1.26 (H) 03/28/2020 1452   CREATININE 1.10 01/14/2020 0859      Component Value  Date/Time   CALCIUM 9.1 03/28/2020 1452   ALKPHOS 140 (H) 03/28/2020 1452   AST 14 (L) 03/28/2020 1452   ALT 18 03/28/2020 1452   BILITOT 0.3 03/28/2020 1452       RADIOGRAPHIC STUDIES: MR BRAIN W WO CONTRAST  Result Date: 03/24/2020 CLINICAL DATA:  59 year old male with recently diagnosed lung cancer. Staging. No neurologic symptoms. EXAM: MRI HEAD WITHOUT AND WITH CONTRAST TECHNIQUE: Multiplanar, multiecho pulse sequences of the brain and surrounding structures were obtained without and with intravenous contrast. CONTRAST:  19mL GADAVIST GADOBUTROL 1 MMOL/ML IV SOLN COMPARISON:  PET-CT 03/14/2020 FINDINGS: Brain: No midline shift, mass effect, or evidence of intracranial mass lesion. No abnormal enhancement identified. No dural thickening is identified. Cerebral volume is within normal limits. No restricted diffusion to suggest acute infarction. No ventriculomegaly, extra-axial collection or acute intracranial hemorrhage. Cervicomedullary junction and pituitary are within normal limits.  Pearline Cables and white matter signal is within normal limits for age throughout the brain. No cortical encephalomalacia or chronic cerebral blood products identified. Vascular: Major intracranial vascular flow voids are preserved. The major dural venous sinuses are enhancing and appear to be patent. Skull and upper cervical spine: Negative visible cervical spine and spinal cord. Visualized bone marrow signal is within normal limits. Sinuses/Orbits: Negative orbits. Only trace paranasal sinus mucosal thickening. Other: Mild left mastoid effusion. Negative nasopharynx. Right mastoids are clear. Grossly normal visible internal auditory structures. Scalp and face soft tissues appear negative. IMPRESSION: 1. No metastatic disease or acute intracranial abnormality. Normal for age MRI appearance of the brain. 2. Mild left mastoid effusion, likely postinflammatory and significance is doubtful. Electronically Signed   By: Genevie Ann M.D.    On: 03/24/2020 08:08   NM PET Image Initial (PI) Skull Base To Thigh  Result Date: 03/14/2020 CLINICAL DATA:  Initial treatment strategy for right lung mass. EXAM: NUCLEAR MEDICINE PET SKULL BASE TO THIGH TECHNIQUE: 11.5 mCi F-18 FDG was injected intravenously. Full-ring PET imaging was performed from the skull base to thigh after the radiotracer. CT data was obtained and used for attenuation correction and anatomic localization. Fasting blood glucose: 165 mg/dl COMPARISON:  Lung cancer screening CT 03/05/2020 FINDINGS: Mediastinal blood pool activity: SUV max 2.7 Liver activity: SUV max NA NECK: No hypermetabolic lymph nodes in the neck. Incidental CT findings: none CHEST: Irregular suprahilar mass identified on recent lung cancer screening CT is markedly hypermetabolic with SUV max = 10.1. Precarinal metastatic lymphadenopathy is hypermetabolic with SUV max = 9.1. Subcarinal hypermetabolic lymphadenopathy is hypermetabolic with SUV max = 75.1. Hypermetabolic nodal metastases are seen in the high right paratracheal and prevascular space of the mediastinum. Incidental CT findings: Centrilobular emphsyema noted. ABDOMEN/PELVIS: No abnormal hypermetabolic activity within the liver, pancreas, adrenal glands, or spleen. No hypermetabolic lymph nodes in the abdomen or pelvis. Incidental CT findings: Tiny nonobstructing stone identified lower pole right kidney. There is abdominal aortic atherosclerosis without aneurysm. Left colonic diverticulosis without diverticulitis. Mild circumferential bladder wall thickening may be related to underdistention. Small bilateral groin hernias contain only fat. SKELETON: No focal hypermetabolic activity to suggest skeletal metastasis. Incidental CT findings: none IMPRESSION: 1. Markedly hypermetabolic central right upper lobe pulmonary lesion consistent with primary bronchogenic neoplasm. This is associated with hypermetabolic nodal metastases in the right hilum and mediastinum. No  evidence for hypermetabolic metastatic disease in the neck, abdomen, or pelvis. 2. Tiny nonobstructing right renal stone. 3. Colonic diverticulosis without diverticulitis. 4.  Emphysema. (WCH85-I77.9) Aortic Atherosclerois (ICD10-170.0) Electronically Signed   By: Misty Stanley M.D.   On: 03/14/2020 09:37    ASSESSMENT AND PLAN: This is a 59 years old white male was recently diagnosed with stage IIIa non-small cell lung cancer, squamous cell carcinoma in April 2021 presented with right upper lobe/suprahilar lung mass in addition to right paratracheal and subcarinal lymphadenopathy. The patient is currently undergoing a course of concurrent chemoradiation with weekly carboplatin for AUC of 2 and paclitaxel 45 NG/M2.  He is status post 1 cycle.  He tolerated the first week of his treatment fairly well with no concerning adverse effects. I recommended for the patient to proceed with cycle #2 today as planned. He will come back for follow-up visit in 2 weeks for evaluation before starting cycle #4. He was advised to call immediately if he has any concerning symptoms in the interval. The patient voices understanding of current disease status and treatment options and is in agreement  with the current care plan. All questions were answered. The patient knows to call the clinic with any problems, questions or concerns. We can certainly see the patient much sooner if necessary.  Disclaimer: This note was dictated with voice recognition software. Similar sounding words can inadvertently be transcribed and may not be corrected upon review.

## 2020-04-08 ENCOUNTER — Ambulatory Visit
Admission: RE | Admit: 2020-04-08 | Discharge: 2020-04-08 | Disposition: A | Discharge status: Home / Self Care | Payer: BC Managed Care – PPO | Source: Ambulatory Visit | Attending: Radiation Oncology | Admitting: Radiation Oncology

## 2020-04-08 ENCOUNTER — Other Ambulatory Visit: Payer: Self-pay | Admitting: Radiation Oncology

## 2020-04-08 ENCOUNTER — Other Ambulatory Visit: Payer: Self-pay

## 2020-04-08 ENCOUNTER — Telehealth: Payer: Self-pay | Admitting: Radiation Oncology

## 2020-04-08 DIAGNOSIS — C3411 Malignant neoplasm of upper lobe, right bronchus or lung: Secondary | ICD-10-CM | POA: Diagnosis not present

## 2020-04-08 DIAGNOSIS — Z510 Encounter for antineoplastic radiation therapy: Secondary | ICD-10-CM | POA: Diagnosis not present

## 2020-04-08 NOTE — Telephone Encounter (Signed)
Would you mind letting him know to contact Dr. Sheppard Coil?

## 2020-04-08 NOTE — Telephone Encounter (Signed)
Unfortunately this is related to his dexamethasone premedication.  He may need to monitor his blood sugar more closely and consider increasing his diabetic medication for the 2 days after the treatment.  He may also need to discuss with his primary care physician to adjust his treatment during these 2 days.  Thank you.

## 2020-04-08 NOTE — Telephone Encounter (Signed)
Noted, pt can follow up as usual for DM monitoring,

## 2020-04-08 NOTE — Progress Notes (Signed)
Pharmacist Chemotherapy Monitoring - Follow Up Assessment    I verify that I have reviewed each item in the below checklist:   Regimen for the patient is scheduled for the appropriate day and plan matches scheduled date.  Appropriate non-routine labs are ordered dependent on drug ordered.  If applicable, additional medications reviewed and ordered per protocol based on lifetime cumulative doses and/or treatment regimen.   Plan for follow-up and/or issues identified: No  I-vent associated with next due treatment: No  MD and/or nursing notified: No   Kennith Center, Pharm.D., CPP 04/08/2020@11 :00 AM

## 2020-04-08 NOTE — Telephone Encounter (Signed)
I called the patient back after he let us know he was having high blood sugars yesterday and today as well as the day or two following his last chemo. He has had sugars as high as 499 last night after dinner, then 285 this am before eating, and 300 prior to dinner. His sugars seem to rise the day of chemo, and trend back down toward 120-140s two days later. I suspect his dexamethasone 20 mg IV x1 as a premed on chemo days are the source of his hyperglycemia. He has feel somewhat sleepy during these types, but denies headaches or visual disturbances.     Carola Rhine, PAC

## 2020-04-09 ENCOUNTER — Other Ambulatory Visit: Payer: Self-pay

## 2020-04-09 ENCOUNTER — Ambulatory Visit
Admission: RE | Admit: 2020-04-09 | Discharge: 2020-04-09 | Disposition: A | Discharge status: Home / Self Care | Payer: BC Managed Care – PPO | Source: Ambulatory Visit | Attending: Radiation Oncology | Admitting: Radiation Oncology

## 2020-04-09 DIAGNOSIS — Z510 Encounter for antineoplastic radiation therapy: Secondary | ICD-10-CM | POA: Diagnosis not present

## 2020-04-09 DIAGNOSIS — C3411 Malignant neoplasm of upper lobe, right bronchus or lung: Secondary | ICD-10-CM | POA: Diagnosis not present

## 2020-04-10 ENCOUNTER — Other Ambulatory Visit: Payer: Self-pay

## 2020-04-10 ENCOUNTER — Ambulatory Visit (INDEPENDENT_AMBULATORY_CARE_PROVIDER_SITE_OTHER): Payer: BC Managed Care – PPO | Admitting: Osteopathic Medicine

## 2020-04-10 ENCOUNTER — Ambulatory Visit
Admission: RE | Admit: 2020-04-10 | Discharge: 2020-04-10 | Disposition: A | Discharge status: Home / Self Care | Payer: BC Managed Care – PPO | Source: Ambulatory Visit | Attending: Radiation Oncology | Admitting: Radiation Oncology

## 2020-04-10 ENCOUNTER — Encounter: Payer: Self-pay | Admitting: Osteopathic Medicine

## 2020-04-10 VITALS — BP 126/77 | HR 90 | Temp 98.0°F | Wt 235.0 lb

## 2020-04-10 DIAGNOSIS — C3411 Malignant neoplasm of upper lobe, right bronchus or lung: Secondary | ICD-10-CM | POA: Diagnosis not present

## 2020-04-10 DIAGNOSIS — Z510 Encounter for antineoplastic radiation therapy: Secondary | ICD-10-CM | POA: Diagnosis not present

## 2020-04-10 DIAGNOSIS — E119 Type 2 diabetes mellitus without complications: Secondary | ICD-10-CM

## 2020-04-10 NOTE — Progress Notes (Signed)
William Farrell is a 59 y.o. male who presents to  [place] at [facility]  today, 04/10/20, seeking care for the following: . DM2 - hyperglycemia, likely d/t steroids as part of Lung CA tx regimen. Taking Glipizide, Metformin. On Taxol, Carboplatin.  Receiving doses of dexamethasone with chemotherapy.  Patient is significantly upset about the unclear communication from his oncologist with regard to the blood sugars, patient is very worried about the hyperglycemia and he was not told to expect to this side effect of the steroids.     ASSESSMENT & PLAN with other pertinent history/findings:  The encounter diagnosis was Type 2 diabetes mellitus without complication, without long-term current use of insulin ([facility]).  I advised patient that the hyperglycemia after steroid injection is transient, as long as it is coming down over a few days I am not particularly worried about it. I offered short acting insulin to help counteract this, other option would be to monitor sugars and as long as they are coming back down to normal range after the injections, I am not too worried about it, would expect A1c to be a bit higher on the next check but as long as it is not significantly high, I would just keep on current medications through course of chemotherapy.  Patient opts to stay on current medications.  I advised him to alert me if any changes/concerns     Follow-up instructions: Return for A1c check 1 to 2 weeks after finishing chemotherapy.                                         BP 126/77 (BP Location: Left Arm, Patient Position: Sitting)   Pulse 90   Temp 98 F (36.7 C)   Wt 235 lb (106.6 kg)   BMI 31.87 kg/m   Current Meds  Medication Sig  . acetaminophen (TYLENOL) 500 MG tablet Take 1,000 mg by mouth every 6 (six) hours as needed for moderate pain or headache.  . albuterol (VENTOLIN HFA) 108 (90 Base)  MCG/ACT inhaler Inhale 2 puffs into the lungs every 6 (six) hours as needed for wheezing or shortness of breath.   Marland Kitchen aspirin EC 81 MG tablet Take 81 mg by mouth daily.  Marland Kitchen atorvastatin (LIPITOR) 40 MG tablet Take 1 tablet (40 mg total) by mouth daily.  . calcium carbonate (TUMS - DOSED IN MG ELEMENTAL CALCIUM) 500 MG chewable tablet Chew 2 tablets by mouth daily as needed for indigestion or heartburn.  Marland Kitchen glipiZIDE (GLUCOTROL) 5 MG tablet Take 1 tablet (5 mg total) by mouth daily before breakfast.  . glucose blood test strip Use as instructed up to qid  . meloxicam (MOBIC) 15 MG tablet Take 1 tablet (15 mg total) by mouth daily.  . metFORMIN (GLUCOPHAGE) 1000 MG tablet Take 1 tablet (1,000 mg total) by mouth 2 (two) times daily with a meal.  . nicotine (NICODERM CQ - DOSED IN MG/24 HR) 7 mg/24hr patch Place 1 patch (7 mg total) onto the skin daily.  Glory Rosebush Delica Lancets 74Y MISC As directed up to qid  . prochlorperazine (COMPAZINE) 10 MG tablet Take 1 tablet (10 mg total) by mouth every 6 (six) hours as needed for nausea or vomiting.  . trolamine salicylate (ASPERCREME) 10 % cream Apply 1 application topically as needed for muscle pain.  . varenicline (CHANTIX STARTING MONTH PAK) 0.5  MG X 11 & 1 MG X 42 tablet Take one 0.5 mg tablet by mouth once daily for 3 days, then increase to one 0.5 mg tablet twice daily for 4 days, then increase to one 1 mg tablet twice daily.  . varenicline (CHANTIX) 1 MG tablet Take 1 tablet (1 mg total) by mouth 2 (two) times daily.    No results found for this or any previous visit (from the past 72 hour(s)).  No results found.  Depression screen [facility] 2/9 01/17/2020 06/19/2019  Decreased Interest 0 0  Down, Depressed, Hopeless 0 0  PHQ - 2 Score 0 0  Altered sleeping 0 -  Tired, decreased energy 0 -  Change in appetite 0 -  Feeling bad or failure about yourself  0 -  Trouble concentrating 0 -  Moving slowly or fidgety/restless 0 -  Suicidal thoughts 0 -  PHQ-9  Score 0 -    GAD 7 : Generalized Anxiety Score 01/17/2020 06/19/2019  Nervous, Anxious, on Edge 0 0  Control/stop worrying 0 0  Worry too much - different things 0 0  Trouble relaxing 0 0  Restless 0 0  Easily annoyed or irritable 0 0  Afraid - awful might happen 0 0  Total GAD 7 Score 0 0  Anxiety Difficulty - Somewhat difficult      All questions at time of visit were answered - patient instructed to contact office with any additional concerns or updates.  ER/RTC precautions were reviewed with the patient.  Please note: voice recognition software was used to produce this document, and typos may escape review. Please contact Dr. Sheppard Coil for any needed clarifications.   Total encounter time: 40 minutes.

## 2020-04-11 ENCOUNTER — Ambulatory Visit
Admission: RE | Admit: 2020-04-11 | Discharge: 2020-04-11 | Disposition: A | Discharge status: Home / Self Care | Payer: BC Managed Care – PPO | Source: Ambulatory Visit | Attending: Radiation Oncology | Admitting: Radiation Oncology

## 2020-04-11 DIAGNOSIS — C3411 Malignant neoplasm of upper lobe, right bronchus or lung: Secondary | ICD-10-CM | POA: Diagnosis not present

## 2020-04-11 DIAGNOSIS — Z510 Encounter for antineoplastic radiation therapy: Secondary | ICD-10-CM | POA: Diagnosis not present

## 2020-04-15 ENCOUNTER — Ambulatory Visit
Admission: RE | Admit: 2020-04-15 | Discharge: 2020-04-15 | Disposition: A | Discharge status: Home / Self Care | Payer: BC Managed Care – PPO | Source: Ambulatory Visit | Attending: Radiation Oncology | Admitting: Radiation Oncology

## 2020-04-15 ENCOUNTER — Telehealth: Payer: Self-pay | Admitting: *Deleted

## 2020-04-15 ENCOUNTER — Inpatient Hospital Stay: Payer: BC Managed Care – PPO | Attending: Internal Medicine

## 2020-04-15 ENCOUNTER — Telehealth: Payer: Self-pay | Admitting: Medical Oncology

## 2020-04-15 ENCOUNTER — Inpatient Hospital Stay: Payer: BC Managed Care – PPO

## 2020-04-15 ENCOUNTER — Other Ambulatory Visit: Payer: Self-pay

## 2020-04-15 VITALS — BP 123/73 | HR 89 | Temp 98.1°F | Resp 16

## 2020-04-15 DIAGNOSIS — K219 Gastro-esophageal reflux disease without esophagitis: Secondary | ICD-10-CM | POA: Diagnosis not present

## 2020-04-15 DIAGNOSIS — Z5111 Encounter for antineoplastic chemotherapy: Secondary | ICD-10-CM | POA: Insufficient documentation

## 2020-04-15 DIAGNOSIS — Z791 Long term (current) use of non-steroidal anti-inflammatories (NSAID): Secondary | ICD-10-CM | POA: Insufficient documentation

## 2020-04-15 DIAGNOSIS — Z79899 Other long term (current) drug therapy: Secondary | ICD-10-CM | POA: Insufficient documentation

## 2020-04-15 DIAGNOSIS — Z7984 Long term (current) use of oral hypoglycemic drugs: Secondary | ICD-10-CM | POA: Insufficient documentation

## 2020-04-15 DIAGNOSIS — C3411 Malignant neoplasm of upper lobe, right bronchus or lung: Secondary | ICD-10-CM

## 2020-04-15 DIAGNOSIS — Z510 Encounter for antineoplastic radiation therapy: Secondary | ICD-10-CM | POA: Diagnosis not present

## 2020-04-15 DIAGNOSIS — Z7982 Long term (current) use of aspirin: Secondary | ICD-10-CM | POA: Insufficient documentation

## 2020-04-15 DIAGNOSIS — R590 Localized enlarged lymph nodes: Secondary | ICD-10-CM | POA: Insufficient documentation

## 2020-04-15 DIAGNOSIS — E1165 Type 2 diabetes mellitus with hyperglycemia: Secondary | ICD-10-CM | POA: Diagnosis not present

## 2020-04-15 DIAGNOSIS — E7800 Pure hypercholesterolemia, unspecified: Secondary | ICD-10-CM | POA: Insufficient documentation

## 2020-04-15 LAB — CMP (CANCER CENTER ONLY)
ALT: 20 U/L (ref 0–44)
AST: 13 U/L — ABNORMAL LOW (ref 15–41)
Albumin: 3.2 g/dL — ABNORMAL LOW (ref 3.5–5.0)
Alkaline Phosphatase: 124 U/L (ref 38–126)
Anion gap: 12 (ref 5–15)
BUN: 18 mg/dL (ref 6–20)
CO2: 21 mmol/L — ABNORMAL LOW (ref 22–32)
Calcium: 9.4 mg/dL (ref 8.9–10.3)
Chloride: 103 mmol/L (ref 98–111)
Creatinine: 1.12 mg/dL (ref 0.61–1.24)
GFR, Est AFR Am: >60 mL/min (ref >60)
GFR, Estimated: >60 mL/min (ref >60)
Glucose, Bld: 214 mg/dL — ABNORMAL HIGH (ref 70–99)
Potassium: 4.6 mmol/L (ref 3.5–5.1)
Sodium: 136 mmol/L (ref 135–145)
Total Bilirubin: 0.4 mg/dL (ref 0.3–1.2)
Total Protein: 7.2 g/dL (ref 6.5–8.1)

## 2020-04-15 LAB — CBC WITH DIFFERENTIAL (CANCER CENTER ONLY)
Abs Immature Granulocytes: 0.04 10*3/uL (ref 0.00–0.07)
Basophils Absolute: 0.1 10*3/uL (ref 0.0–0.1)
Basophils Relative: 1 %
Eosinophils Absolute: 0.2 10*3/uL (ref 0.0–0.5)
Eosinophils Relative: 3 %
HCT: 35.9 % — ABNORMAL LOW (ref 39.0–52.0)
Hemoglobin: 11.9 g/dL — ABNORMAL LOW (ref 13.0–17.0)
Immature Granulocytes: 1 %
Lymphocytes Relative: 14 %
Lymphs Abs: 0.8 10*3/uL (ref 0.7–4.0)
MCH: 27.5 pg (ref 26.0–34.0)
MCHC: 33.1 g/dL (ref 30.0–36.0)
MCV: 83.1 fL (ref 80.0–100.0)
Monocytes Absolute: 0.7 10*3/uL (ref 0.1–1.0)
Monocytes Relative: 11 %
Neutro Abs: 4.2 10*3/uL (ref 1.7–7.7)
Neutrophils Relative %: 70 %
Platelet Count: 191 10*3/uL (ref 150–400)
RBC: 4.32 MIL/uL (ref 4.22–5.81)
RDW: 14 % (ref 11.5–15.5)
WBC Count: 6 10*3/uL (ref 4.0–10.5)
nRBC: 0 % (ref 0.0–0.2)

## 2020-04-15 MED ORDER — SODIUM CHLORIDE 0.9 % IV SOLN
287.4000 mg | Freq: Once | INTRAVENOUS | Status: AC
  Administered 2020-04-15: 290 mg via INTRAVENOUS
  Filled 2020-04-15: qty 29

## 2020-04-15 MED ORDER — SODIUM CHLORIDE 0.9 % IV SOLN
45.0000 mg/m2 | Freq: Once | INTRAVENOUS | Status: AC
  Administered 2020-04-15: 102 mg via INTRAVENOUS
  Filled 2020-04-15: qty 17

## 2020-04-15 MED ORDER — PALONOSETRON HCL INJECTION 0.25 MG/5ML
INTRAVENOUS | Status: AC
  Filled 2020-04-15: qty 5

## 2020-04-15 MED ORDER — DIPHENHYDRAMINE HCL 50 MG/ML IJ SOLN
50.0000 mg | Freq: Once | INTRAMUSCULAR | Status: AC
  Administered 2020-04-15: 50 mg via INTRAVENOUS

## 2020-04-15 MED ORDER — DIPHENHYDRAMINE HCL 50 MG/ML IJ SOLN
INTRAMUSCULAR | Status: AC
  Filled 2020-04-15: qty 1

## 2020-04-15 MED ORDER — FAMOTIDINE IN NACL 20-0.9 MG/50ML-% IV SOLN
20.0000 mg | Freq: Once | INTRAVENOUS | Status: AC
  Administered 2020-04-15: 20 mg via INTRAVENOUS

## 2020-04-15 MED ORDER — SODIUM CHLORIDE 0.9 % IV SOLN
Freq: Once | INTRAVENOUS | Status: AC
  Filled 2020-04-15: qty 250

## 2020-04-15 MED ORDER — SODIUM CHLORIDE 0.9 % IV SOLN
20.0000 mg | Freq: Once | INTRAVENOUS | Status: AC
  Administered 2020-04-15: 20 mg via INTRAVENOUS
  Filled 2020-04-15: qty 20

## 2020-04-15 MED ORDER — PALONOSETRON HCL INJECTION 0.25 MG/5ML
0.2500 mg | Freq: Once | INTRAVENOUS | Status: AC
  Administered 2020-04-15: 0.25 mg via INTRAVENOUS

## 2020-04-15 MED ORDER — FAMOTIDINE IN NACL 20-0.9 MG/50ML-% IV SOLN
INTRAVENOUS | Status: AC
  Filled 2020-04-15: qty 50

## 2020-04-15 NOTE — Telephone Encounter (Signed)
I received a message from Cassie that patient is not happy with his schedule.  I called to see what I could do.  William Farrell started the conversation off yelling at me.  He went on about how his schedule was wrong and "they" added another chemo treatment.  I listened as he explained.  I updated him that he is correct that chemo needs to be on every Monday and will call scheduling to get this changed.  He continued to yell at me.  I again listened.  He also was upset about the extra chemo.  I tried to explain that his chemo correlates with the radiation therapy.  He yelled and said "we never call him back about things". I explained that I received a message 30 minutes before I called him back.  He is mad with his care and states"I will call the president of the company" and then hung up.  I will update scheduling to change his appt to everyone as set in his treatment plan. I updated Dr. Julien Nordmann.

## 2020-04-15 NOTE — Telephone Encounter (Signed)
Schedule concerns . He needs weekly chemo same day every week due to work schedule. He started on a Monday./" I made this clear upfront that I need the same day every week because I am working . I know this was a holiday issue yesterday".  Norton Blizzard contacted pt about above issues.

## 2020-04-16 ENCOUNTER — Other Ambulatory Visit: Payer: Self-pay

## 2020-04-16 ENCOUNTER — Ambulatory Visit
Admission: RE | Admit: 2020-04-16 | Discharge: 2020-04-16 | Disposition: A | Discharge status: Home / Self Care | Payer: BC Managed Care – PPO | Source: Ambulatory Visit | Attending: Radiation Oncology | Admitting: Radiation Oncology

## 2020-04-16 ENCOUNTER — Telehealth: Payer: Self-pay | Admitting: *Deleted

## 2020-04-16 ENCOUNTER — Encounter: Payer: Self-pay | Admitting: *Deleted

## 2020-04-16 DIAGNOSIS — Z510 Encounter for antineoplastic radiation therapy: Secondary | ICD-10-CM | POA: Diagnosis not present

## 2020-04-16 DIAGNOSIS — C3411 Malignant neoplasm of upper lobe, right bronchus or lung: Secondary | ICD-10-CM | POA: Diagnosis not present

## 2020-04-16 NOTE — Progress Notes (Signed)
Oncology Nurse Navigator Documentation  Oncology Nurse Navigator Flowsheets 04/16/2020  Abnormal Finding Date 03/05/2020  Confirmed Diagnosis Date 03/11/2020  Diagnosis Status Confirmed Diagnosis Complete  Planned Course of Treatment Chemotherapy;Radiation  Phase of Treatment Radiation  Chemotherapy Actual Start Date: 03/31/2020  Radiation Actual Start Date: 03/31/2020  Navigator Follow Up Date: 04/18/2020  Navigator Follow Up Reason: Appointment Review  Navigator Location [facility]  Referral Date to RadOnc/MedOnc -  Navigator Encounter Type Telephone;Other:  Telephone Outgoing Call  Patient Visit Type Other/I received a message from [facility] PA that patient's was complaining about his schedule. I called him yesterday and did not have a good interaction with him.  He is upset that the dates have changed and wants his treatment on Monday's.  He is correct and he needs to have his chemo treatment every Monday to help with the radiation treatment.  I notified scheduling yesterday to please call and make these changes.  I followed up today and these changes have not been made.  I contacted scheduling again to please call and make these changes.  I also updated Dr. Julien Nordmann that patient schedule is off and patient is upset that an extra chemo was added to his plan of care.  Dr. Julien Nordmann will address with patient at his next visit.   Today, I received a notification from Maudie Mercury, [employer] that patient's wife called to apologize for her husbands behavior and wants an updated schedule.   Scheduling will call patient to re-schedule appts  Treatment Phase Treatment  Barriers/Navigation Needs Coordination of Care;Education  Education Other  Interventions Coordination of Care;Education  Acuity Level 2-Minimal Needs (1-2 Barriers Identified)  Coordination of Care Other  Education Method Verbal  Time Spent with Patient 30

## 2020-04-16 NOTE — Telephone Encounter (Signed)
Patients wife called to request change of future appointments to be rescheduled to [facility] instead of the offset date that they were scheduled for due to the holiday.  Spoke with Norton Blizzard, she advised that she already sent scheduling message to have those dates adjusted.

## 2020-04-17 ENCOUNTER — Other Ambulatory Visit: Payer: Self-pay

## 2020-04-17 ENCOUNTER — Ambulatory Visit
Admission: RE | Admit: 2020-04-17 | Discharge: 2020-04-17 | Disposition: A | Discharge status: Home / Self Care | Payer: BC Managed Care – PPO | Source: Ambulatory Visit | Attending: Radiation Oncology | Admitting: Radiation Oncology

## 2020-04-17 ENCOUNTER — Telehealth: Payer: Self-pay | Admitting: Medical Oncology

## 2020-04-17 DIAGNOSIS — Z510 Encounter for antineoplastic radiation therapy: Secondary | ICD-10-CM | POA: Diagnosis not present

## 2020-04-17 DIAGNOSIS — C3411 Malignant neoplasm of upper lobe, right bronchus or lung: Secondary | ICD-10-CM | POA: Diagnosis not present

## 2020-04-17 NOTE — Telephone Encounter (Signed)
William Farrell called asking if [facility] chemo is scheduled for Monday ?  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

## 2020-04-18 ENCOUNTER — Other Ambulatory Visit: Payer: Self-pay

## 2020-04-18 ENCOUNTER — Encounter: Payer: Self-pay | Admitting: *Deleted

## 2020-04-18 ENCOUNTER — Ambulatory Visit
Admission: RE | Admit: 2020-04-18 | Discharge: 2020-04-18 | Disposition: A | Discharge status: Home / Self Care | Payer: BC Managed Care – PPO | Source: Ambulatory Visit | Attending: Radiation Oncology | Admitting: Radiation Oncology

## 2020-04-18 DIAGNOSIS — Z510 Encounter for antineoplastic radiation therapy: Secondary | ICD-10-CM | POA: Diagnosis not present

## 2020-04-18 DIAGNOSIS — C3411 Malignant neoplasm of upper lobe, right bronchus or lung: Secondary | ICD-10-CM | POA: Diagnosis not present

## 2020-04-18 NOTE — Progress Notes (Signed)
I followed up on William Farrell schedule. He is not set up form Monday chemo next week. I contacted scheduling about issue again.

## 2020-04-20 NOTE — Progress Notes (Signed)
[name] OFFICE PROGRESS NOTE  [name], DO [address] [place] [address]  DIAGNOSIS: Stage IIIA (T2a, N2, M0) non-small cell lung cancer, squamous cell carcinoma diagnosed in April 2021, presented with right upper lobe/suprahilar lung mass in addition to right paratracheal and subcarinal lymphadenopathy.  PRIOR THERAPY: None  CURRENT THERAPY: Concurrent chemoradiation with weekly carboplatin for AUC of 2 and paclitaxel 45 NG/M2.  First dose on Mar 31, 2020.  Status post 3 cycles.  INTERVAL HISTORY: William Farrell 59 y.o. male returns to the clinic for a follow up visit accompanied by his wife William Farrell. The patient is feeling well today without any concerning complaints except he is expressed some concerns with his schedule which was discussed. The patient continues to tolerate treatment with concurrent chemoradiation well without any adverse effects except some fatigue. His last radiation treatment is scheduled for 05/15/20 under the care of Dr. Lisbeth Renshaw. He has some occasional right shoulder pain which he attributes to laying on the mold for radiation. Denies any fever, chills, or weight loss. He experienced night sweats prior to diagnosis which he attributed to his blood sugars. Denies any chest pain, shortness of breath, cough, or hemoptysis. He gets an occasional wheeze for which he uses his albuterol. Denies any nausea, vomiting, diarrhea, or constipation. Denies any headache or visual changes. He has some radiation skin changes but denies erythema. He has ointment for this. The patient is here today for evaluation prior to starting cycle # 4  MEDICAL HISTORY: Past Medical History:  Diagnosis Date  . Asthma    as a child  . Diabetes ([place])   . Diverticulitis   . GERD (gastroesophageal reflux disease)   . High cholesterol   . History of kidney stones     ALLERGIES:  is allergic to sitagliptin.  MEDICATIONS:  Current Outpatient Medications   Medication Sig Dispense Refill  . acetaminophen (TYLENOL) 500 MG tablet Take 1,000 mg by mouth every 6 (six) hours as needed for moderate pain or headache.    . albuterol (VENTOLIN HFA) 108 (90 Base) MCG/ACT inhaler Inhale 2 puffs into the lungs every 6 (six) hours as needed for wheezing or shortness of breath.     Marland Kitchen aspirin EC 81 MG tablet Take 81 mg by mouth daily.    Marland Kitchen atorvastatin (LIPITOR) 40 MG tablet Take 1 tablet (40 mg total) by mouth daily. 90 tablet 3  . calcium carbonate (TUMS - DOSED IN MG ELEMENTAL CALCIUM) 500 MG chewable tablet Chew 2 tablets by mouth daily as needed for indigestion or heartburn.    Marland Kitchen glipiZIDE (GLUCOTROL) 5 MG tablet Take 1 tablet (5 mg total) by mouth daily before breakfast. 90 tablet 3  . glucose blood test strip Use as instructed up to qid 100 each 99  . meloxicam (MOBIC) 15 MG tablet Take 1 tablet (15 mg total) by mouth daily. 90 tablet 3  . metFORMIN (GLUCOPHAGE) 1000 MG tablet Take 1 tablet (1,000 mg total) by mouth 2 (two) times daily with a meal. 180 tablet 3  . nicotine (NICODERM CQ - DOSED IN MG/24 HR) 7 mg/24hr patch Place 1 patch (7 mg total) onto the skin daily. 28 patch 0  . OneTouch Delica Lancets 46E MISC As directed up to qid 100 each 99  . prochlorperazine (COMPAZINE) 10 MG tablet Take 1 tablet (10 mg total) by mouth every 6 (six) hours as needed for nausea or vomiting. 30 tablet 0  . trolamine salicylate (ASPERCREME) 10 %  cream Apply 1 application topically as needed for muscle pain.    . varenicline (CHANTIX STARTING MONTH PAK) 0.5 MG X 11 & 1 MG X 42 tablet Take one 0.5 mg tablet by mouth once daily for 3 days, then increase to one 0.5 mg tablet twice daily for 4 days, then increase to one 1 mg tablet twice daily. 53 tablet 0  . varenicline (CHANTIX) 1 MG tablet Take 1 tablet (1 mg total) by mouth 2 (two) times daily. 180 tablet 1   No current facility-administered medications for this visit.    SURGICAL HISTORY:  Past Surgical History:   Procedure Laterality Date  . broken bone repair    . BRONCHIAL BIOPSY  03/11/2020   Procedure: BRONCHIAL BIOPSIES;  Surgeon: Garner Nash, DO;  Location: [facility];  Service: Pulmonary;;  . COLONOSCOPY    . CRYOTHERAPY  03/11/2020   Procedure: CRYOTHERAPY;  Surgeon: Garner Nash, DO;  Location: [facility];  Service: Pulmonary;;  . FINE NEEDLE ASPIRATION  03/11/2020   Procedure: FINE NEEDLE ASPIRATION (FNA) LINEAR;  Surgeon: Garner Nash, DO;  Location: [facility];  Service: Pulmonary;;  . KNEE ARTHROSCOPY WITH ANTERIOR CRUCIATE LIGAMENT (ACL) REPAIR     x 2  . VIDEO BRONCHOSCOPY WITH ENDOBRONCHIAL ULTRASOUND N/A 03/11/2020   Procedure: VIDEO BRONCHOSCOPY WITH ENDOBRONCHIAL ULTRASOUND;  Surgeon: Garner Nash, DO;  Location: [facility];  Service: Pulmonary;  Laterality: N/A;    REVIEW OF SYSTEMS:   Review of Systems  Constitutional: Positive for fatigue.  Negative for appetite change, chills, fever and unexpected weight change.  HENT: Negative for mouth sores, nosebleeds, sore throat and trouble swallowing.   Eyes: Negative for eye problems and icterus.  Respiratory: Negative for cough, hemoptysis, shortness of breath and wheezing.   Cardiovascular: Negative for chest pain and leg swelling.  Gastrointestinal: Positive for occasional baseline constipation. Negative for abdominal pain, constipation, diarrhea, nausea and vomiting.  Genitourinary: Negative for bladder incontinence, difficulty urinating, dysuria, frequency and hematuria.   Musculoskeletal: Positive for mild occasional right should pain. Negative for back pain, gait problem, neck pain and neck stiffness.  Skin: Negative for itching and rash.  Neurological: Negative for dizziness, extremity weakness, gait problem, headaches, light-headedness and seizures.  Hematological: Negative for adenopathy. Does not bruise/bleed easily.  Psychiatric/Behavioral: Negative for confusion, depression and sleep disturbance.  The patient is not nervous/anxious.     PHYSICAL EXAMINATION:  Blood pressure 111/67, pulse 92, temperature 98.1 F (36.7 C), temperature source Temporal, resp. rate 18, height 6' (1.829 m), weight 233 lb 11.2 oz (106 kg), SpO2 98 %.  ECOG PERFORMANCE STATUS: 1 - Symptomatic but completely ambulatory  Physical Exam  Constitutional: Oriented to person, place, and time and well-developed, well-nourished, and in no distress.  HENT:  Head: Normocephalic and atraumatic.  Mouth/Throat: Oropharynx is clear and moist. No oropharyngeal exudate.  Eyes: Conjunctivae are normal. Right eye exhibits no discharge. Left eye exhibits no discharge. No scleral icterus.  Neck: Normal range of motion. Neck supple.  Cardiovascular: Normal rate, regular rhythm, normal heart sounds and intact distal pulses.   Pulmonary/Chest: Effort normal and breath sounds normal. No respiratory distress. No wheezes. No rales.  Abdominal: Soft. Bowel sounds are normal. Exhibits no distension and no mass. There is no tenderness.  Musculoskeletal: Normal range of motion. Exhibits no edema.  Lymphadenopathy:    No cervical adenopathy.  Neurological: Alert and oriented to person, place, and time. Exhibits normal muscle tone. Gait normal. Coordination normal.  Skin: Skin is  warm and dry. No rash noted. Not diaphoretic. No erythema. No pallor.  Psychiatric: Mood, memory and judgment normal.  Vitals reviewed.  LABORATORY DATA: Lab Results  Component Value Date   WBC 5.8 04/21/2020   HGB 11.8 (L) 04/21/2020   HCT 35.4 (L) 04/21/2020   MCV 83.1 04/21/2020   PLT 173 04/21/2020      Chemistry      Component Value Date/Time   NA 137 04/21/2020 0837   K 4.6 04/21/2020 0837   CL 103 04/21/2020 0837   CO2 22 04/21/2020 0837   BUN 20 04/21/2020 0837   CREATININE 1.26 (H) 04/21/2020 0837   CREATININE 1.10 01/14/2020 0859      Component Value Date/Time   CALCIUM 9.3 04/21/2020 0837   ALKPHOS 128 (H) 04/21/2020 0837   AST 9  (L) 04/21/2020 0837   ALT 17 04/21/2020 0837   BILITOT 0.5 04/21/2020 0837       RADIOGRAPHIC STUDIES:  MR BRAIN W WO CONTRAST  Result Date: 03/24/2020 CLINICAL DATA:  59 year old male with recently diagnosed lung cancer. Staging. No neurologic symptoms. EXAM: MRI HEAD WITHOUT AND WITH CONTRAST TECHNIQUE: Multiplanar, multiecho pulse sequences of the brain and surrounding structures were obtained without and with intravenous contrast. CONTRAST:  25mL GADAVIST GADOBUTROL 1 MMOL/ML IV SOLN COMPARISON:  PET-CT 03/14/2020 FINDINGS: Brain: No midline shift, mass effect, or evidence of intracranial mass lesion. No abnormal enhancement identified. No dural thickening is identified. Cerebral volume is within normal limits. No restricted diffusion to suggest acute infarction. No ventriculomegaly, extra-axial collection or acute intracranial hemorrhage. Cervicomedullary junction and pituitary are within normal limits. Pearline Cables and white matter signal is within normal limits for age throughout the brain. No cortical encephalomalacia or chronic cerebral blood products identified. Vascular: Major intracranial vascular flow voids are preserved. The major dural venous sinuses are enhancing and appear to be patent. Skull and upper cervical spine: Negative visible cervical spine and spinal cord. Visualized bone marrow signal is within normal limits. Sinuses/Orbits: Negative orbits. Only trace paranasal sinus mucosal thickening. Other: Mild left mastoid effusion. Negative nasopharynx. Right mastoids are clear. Grossly normal visible internal auditory structures. Scalp and face soft tissues appear negative. IMPRESSION: 1. No metastatic disease or acute intracranial abnormality. Normal for age MRI appearance of the brain. 2. Mild left mastoid effusion, likely postinflammatory and significance is doubtful. Electronically Signed   By: Genevie Ann M.D.   On: 03/24/2020 08:08     ASSESSMENT/PLAN:  This is a 59 year old Caucasian  male diagnosed with stage IIIa non-small cell lung cancer, squamous cell carcinoma in April 2021 presented with right upper lobe/suprahilar lung mass in addition to right paratracheal and subcarinal lymphadenopathy.   The patient is currently undergoing a course of concurrent chemoradiation with weekly carboplatin for AUC of 2 and paclitaxel 45 NG/M2.  He is status post 3 cycles.  He tolerated the few weeks of treatment well except for mild fatigue.   Labs were reviewed in detail with the patient. Recommend that he proceed with cycle #4 today as scheduled.   We will see him back for a follow up visit in 2 weeks for evaluation before starting cycle #6.   Regarding his hyperglycemia, the patient notified his PCP about his spikes in blood sugar. He knows to monitor for signs and symptoms of hyperglycemia and to monitor his blood sugars closely at home.   The patient was advised to call immediately if he has any concerning symptoms in the interval. The patient voices understanding  of current disease status and treatment options and is in agreement with the current care plan. All questions were answered. The patient knows to call the clinic with any problems, questions or concerns. We can certainly see the patient much sooner if necessary     No orders of the defined types were placed in this encounter.    Sharika Mosquera L Rocket Gunderson, PA-C 04/21/20

## 2020-04-21 ENCOUNTER — Inpatient Hospital Stay: Payer: BC Managed Care – PPO

## 2020-04-21 ENCOUNTER — Other Ambulatory Visit: Payer: Self-pay

## 2020-04-21 ENCOUNTER — Inpatient Hospital Stay: Payer: BC Managed Care – PPO | Admitting: Physician Assistant

## 2020-04-21 ENCOUNTER — Ambulatory Visit
Admission: RE | Admit: 2020-04-21 | Discharge: 2020-04-21 | Disposition: A | Discharge status: Home / Self Care | Payer: BC Managed Care – PPO | Source: Ambulatory Visit | Attending: Radiation Oncology | Admitting: Radiation Oncology

## 2020-04-21 VITALS — BP 111/67 | HR 92 | Temp 98.1°F | Resp 18 | Ht 72.0 in | Wt 233.7 lb

## 2020-04-21 DIAGNOSIS — Z5111 Encounter for antineoplastic chemotherapy: Secondary | ICD-10-CM | POA: Diagnosis not present

## 2020-04-21 DIAGNOSIS — Z7984 Long term (current) use of oral hypoglycemic drugs: Secondary | ICD-10-CM | POA: Diagnosis not present

## 2020-04-21 DIAGNOSIS — Z79899 Other long term (current) drug therapy: Secondary | ICD-10-CM | POA: Diagnosis not present

## 2020-04-21 DIAGNOSIS — E7800 Pure hypercholesterolemia, unspecified: Secondary | ICD-10-CM | POA: Diagnosis not present

## 2020-04-21 DIAGNOSIS — C3411 Malignant neoplasm of upper lobe, right bronchus or lung: Secondary | ICD-10-CM | POA: Diagnosis not present

## 2020-04-21 DIAGNOSIS — R590 Localized enlarged lymph nodes: Secondary | ICD-10-CM | POA: Diagnosis not present

## 2020-04-21 DIAGNOSIS — Z510 Encounter for antineoplastic radiation therapy: Secondary | ICD-10-CM | POA: Diagnosis not present

## 2020-04-21 DIAGNOSIS — Z791 Long term (current) use of non-steroidal anti-inflammatories (NSAID): Secondary | ICD-10-CM | POA: Diagnosis not present

## 2020-04-21 DIAGNOSIS — Z7982 Long term (current) use of aspirin: Secondary | ICD-10-CM | POA: Diagnosis not present

## 2020-04-21 DIAGNOSIS — K219 Gastro-esophageal reflux disease without esophagitis: Secondary | ICD-10-CM | POA: Diagnosis not present

## 2020-04-21 DIAGNOSIS — E1165 Type 2 diabetes mellitus with hyperglycemia: Secondary | ICD-10-CM | POA: Diagnosis not present

## 2020-04-21 LAB — CBC WITH DIFFERENTIAL (CANCER CENTER ONLY)
Abs Immature Granulocytes: 0.05 10*3/uL (ref 0.00–0.07)
Basophils Absolute: 0.1 10*3/uL (ref 0.0–0.1)
Basophils Relative: 1 %
Eosinophils Absolute: 0.1 10*3/uL (ref 0.0–0.5)
Eosinophils Relative: 2 %
HCT: 35.4 % — ABNORMAL LOW (ref 39.0–52.0)
Hemoglobin: 11.8 g/dL — ABNORMAL LOW (ref 13.0–17.0)
Immature Granulocytes: 1 %
Lymphocytes Relative: 11 %
Lymphs Abs: 0.7 10*3/uL (ref 0.7–4.0)
MCH: 27.7 pg (ref 26.0–34.0)
MCHC: 33.3 g/dL (ref 30.0–36.0)
MCV: 83.1 fL (ref 80.0–100.0)
Monocytes Absolute: 0.5 10*3/uL (ref 0.1–1.0)
Monocytes Relative: 9 %
Neutro Abs: 4.4 10*3/uL (ref 1.7–7.7)
Neutrophils Relative %: 76 %
Platelet Count: 173 10*3/uL (ref 150–400)
RBC: 4.26 MIL/uL (ref 4.22–5.81)
RDW: 14.5 % (ref 11.5–15.5)
WBC Count: 5.8 10*3/uL (ref 4.0–10.5)
nRBC: 0 % (ref 0.0–0.2)

## 2020-04-21 LAB — CMP (CANCER CENTER ONLY)
ALT: 17 U/L (ref 0–44)
AST: 9 U/L — ABNORMAL LOW (ref 15–41)
Albumin: 3.3 g/dL — ABNORMAL LOW (ref 3.5–5.0)
Alkaline Phosphatase: 128 U/L — ABNORMAL HIGH (ref 38–126)
Anion gap: 12 (ref 5–15)
BUN: 20 mg/dL (ref 6–20)
CO2: 22 mmol/L (ref 22–32)
Calcium: 9.3 mg/dL (ref 8.9–10.3)
Chloride: 103 mmol/L (ref 98–111)
Creatinine: 1.26 mg/dL — ABNORMAL HIGH (ref 0.61–1.24)
GFR, Est AFR Am: >60 mL/min (ref >60)
GFR, Estimated: >60 mL/min (ref >60)
Glucose, Bld: 279 mg/dL — ABNORMAL HIGH (ref 70–99)
Potassium: 4.6 mmol/L (ref 3.5–5.1)
Sodium: 137 mmol/L (ref 135–145)
Total Bilirubin: 0.5 mg/dL (ref 0.3–1.2)
Total Protein: 7 g/dL (ref 6.5–8.1)

## 2020-04-21 MED ORDER — PALONOSETRON HCL INJECTION 0.25 MG/5ML
0.2500 mg | Freq: Once | INTRAVENOUS | Status: AC
  Administered 2020-04-21: 0.25 mg via INTRAVENOUS

## 2020-04-21 MED ORDER — DIPHENHYDRAMINE HCL 50 MG/ML IJ SOLN
INTRAMUSCULAR | Status: AC
  Filled 2020-04-21: qty 1

## 2020-04-21 MED ORDER — PALONOSETRON HCL INJECTION 0.25 MG/5ML
INTRAVENOUS | Status: AC
  Filled 2020-04-21: qty 5

## 2020-04-21 MED ORDER — SODIUM CHLORIDE 0.9 % IV SOLN
45.0000 mg/m2 | Freq: Once | INTRAVENOUS | Status: AC
  Administered 2020-04-21: 102 mg via INTRAVENOUS
  Filled 2020-04-21: qty 17

## 2020-04-21 MED ORDER — SODIUM CHLORIDE 0.9 % IV SOLN
20.0000 mg | Freq: Once | INTRAVENOUS | Status: AC
  Administered 2020-04-21: 20 mg via INTRAVENOUS
  Filled 2020-04-21: qty 20

## 2020-04-21 MED ORDER — FAMOTIDINE IN NACL 20-0.9 MG/50ML-% IV SOLN
20.0000 mg | Freq: Once | INTRAVENOUS | Status: AC
  Administered 2020-04-21: 20 mg via INTRAVENOUS

## 2020-04-21 MED ORDER — DIPHENHYDRAMINE HCL 50 MG/ML IJ SOLN
50.0000 mg | Freq: Once | INTRAMUSCULAR | Status: AC
  Administered 2020-04-21: 50 mg via INTRAVENOUS

## 2020-04-21 MED ORDER — SODIUM CHLORIDE 0.9 % IV SOLN
240.0000 mg | Freq: Once | INTRAVENOUS | Status: AC
  Administered 2020-04-21: 240 mg via INTRAVENOUS
  Filled 2020-04-21: qty 24

## 2020-04-21 MED ORDER — SODIUM CHLORIDE 0.9 % IV SOLN
Freq: Once | INTRAVENOUS | Status: AC
  Filled 2020-04-21: qty 250

## 2020-04-21 MED ORDER — FAMOTIDINE IN NACL 20-0.9 MG/50ML-% IV SOLN
INTRAVENOUS | Status: AC
  Filled 2020-04-21: qty 50

## 2020-04-21 NOTE — Progress Notes (Signed)
Adjust Carboplatin dose to 240mg  due to slight change in Scr per MD.  Hardie Pulley, PharmD, BCPS, BCOP

## 2020-04-22 ENCOUNTER — Ambulatory Visit
Admission: RE | Admit: 2020-04-22 | Discharge: 2020-04-22 | Disposition: A | Discharge status: Home / Self Care | Payer: BC Managed Care – PPO | Source: Ambulatory Visit | Attending: Radiation Oncology | Admitting: Radiation Oncology

## 2020-04-22 ENCOUNTER — Other Ambulatory Visit: Payer: Self-pay

## 2020-04-22 DIAGNOSIS — Z510 Encounter for antineoplastic radiation therapy: Secondary | ICD-10-CM | POA: Diagnosis not present

## 2020-04-22 DIAGNOSIS — C3411 Malignant neoplasm of upper lobe, right bronchus or lung: Secondary | ICD-10-CM | POA: Diagnosis not present

## 2020-04-23 ENCOUNTER — Inpatient Hospital Stay: Payer: BC Managed Care – PPO

## 2020-04-23 ENCOUNTER — Inpatient Hospital Stay: Payer: BC Managed Care – PPO | Admitting: Physician Assistant

## 2020-04-23 ENCOUNTER — Ambulatory Visit
Admission: RE | Admit: 2020-04-23 | Discharge: 2020-04-23 | Disposition: A | Discharge status: Home / Self Care | Payer: BC Managed Care – PPO | Source: Ambulatory Visit | Attending: Radiation Oncology | Admitting: Radiation Oncology

## 2020-04-23 ENCOUNTER — Other Ambulatory Visit: Payer: Self-pay

## 2020-04-23 DIAGNOSIS — Z510 Encounter for antineoplastic radiation therapy: Secondary | ICD-10-CM | POA: Diagnosis not present

## 2020-04-23 DIAGNOSIS — C3411 Malignant neoplasm of upper lobe, right bronchus or lung: Secondary | ICD-10-CM | POA: Diagnosis not present

## 2020-04-24 ENCOUNTER — Ambulatory Visit
Admission: RE | Admit: 2020-04-24 | Discharge: 2020-04-24 | Disposition: A | Discharge status: Home / Self Care | Payer: BC Managed Care – PPO | Source: Ambulatory Visit | Attending: Radiation Oncology | Admitting: Radiation Oncology

## 2020-04-24 DIAGNOSIS — Z510 Encounter for antineoplastic radiation therapy: Secondary | ICD-10-CM | POA: Diagnosis not present

## 2020-04-24 DIAGNOSIS — C3411 Malignant neoplasm of upper lobe, right bronchus or lung: Secondary | ICD-10-CM | POA: Diagnosis not present

## 2020-04-25 ENCOUNTER — Other Ambulatory Visit: Payer: Self-pay

## 2020-04-25 ENCOUNTER — Ambulatory Visit
Admission: RE | Admit: 2020-04-25 | Discharge: 2020-04-25 | Disposition: A | Discharge status: Home / Self Care | Payer: BC Managed Care – PPO | Source: Ambulatory Visit | Attending: Radiation Oncology | Admitting: Radiation Oncology

## 2020-04-25 DIAGNOSIS — C3411 Malignant neoplasm of upper lobe, right bronchus or lung: Secondary | ICD-10-CM | POA: Diagnosis not present

## 2020-04-25 DIAGNOSIS — Z510 Encounter for antineoplastic radiation therapy: Secondary | ICD-10-CM | POA: Diagnosis not present

## 2020-04-28 ENCOUNTER — Ambulatory Visit
Admission: RE | Admit: 2020-04-28 | Discharge: 2020-04-28 | Disposition: A | Discharge status: Home / Self Care | Payer: BC Managed Care – PPO | Source: Ambulatory Visit | Attending: Radiation Oncology | Admitting: Radiation Oncology

## 2020-04-28 ENCOUNTER — Inpatient Hospital Stay: Payer: BC Managed Care – PPO

## 2020-04-28 ENCOUNTER — Other Ambulatory Visit: Payer: Self-pay

## 2020-04-28 VITALS — BP 102/65 | HR 92 | Temp 97.6°F | Resp 20 | Wt 235.0 lb

## 2020-04-28 DIAGNOSIS — C3411 Malignant neoplasm of upper lobe, right bronchus or lung: Secondary | ICD-10-CM

## 2020-04-28 DIAGNOSIS — Z7984 Long term (current) use of oral hypoglycemic drugs: Secondary | ICD-10-CM | POA: Diagnosis not present

## 2020-04-28 DIAGNOSIS — Z79899 Other long term (current) drug therapy: Secondary | ICD-10-CM | POA: Diagnosis not present

## 2020-04-28 DIAGNOSIS — R590 Localized enlarged lymph nodes: Secondary | ICD-10-CM | POA: Diagnosis not present

## 2020-04-28 DIAGNOSIS — Z5111 Encounter for antineoplastic chemotherapy: Secondary | ICD-10-CM | POA: Diagnosis not present

## 2020-04-28 DIAGNOSIS — Z510 Encounter for antineoplastic radiation therapy: Secondary | ICD-10-CM | POA: Diagnosis not present

## 2020-04-28 DIAGNOSIS — E7800 Pure hypercholesterolemia, unspecified: Secondary | ICD-10-CM | POA: Diagnosis not present

## 2020-04-28 DIAGNOSIS — E1165 Type 2 diabetes mellitus with hyperglycemia: Secondary | ICD-10-CM | POA: Diagnosis not present

## 2020-04-28 DIAGNOSIS — K219 Gastro-esophageal reflux disease without esophagitis: Secondary | ICD-10-CM | POA: Diagnosis not present

## 2020-04-28 DIAGNOSIS — Z7982 Long term (current) use of aspirin: Secondary | ICD-10-CM | POA: Diagnosis not present

## 2020-04-28 DIAGNOSIS — Z791 Long term (current) use of non-steroidal anti-inflammatories (NSAID): Secondary | ICD-10-CM | POA: Diagnosis not present

## 2020-04-28 LAB — CBC WITH DIFFERENTIAL (CANCER CENTER ONLY)
Abs Immature Granulocytes: 0.04 10*3/uL (ref 0.00–0.07)
Basophils Absolute: 0.1 10*3/uL (ref 0.0–0.1)
Basophils Relative: 1 %
Eosinophils Absolute: 0.1 10*3/uL (ref 0.0–0.5)
Eosinophils Relative: 2 %
HCT: 35.4 % — ABNORMAL LOW (ref 39.0–52.0)
Hemoglobin: 11.8 g/dL — ABNORMAL LOW (ref 13.0–17.0)
Immature Granulocytes: 1 %
Lymphocytes Relative: 15 %
Lymphs Abs: 0.9 10*3/uL (ref 0.7–4.0)
MCH: 27.8 pg (ref 26.0–34.0)
MCHC: 33.3 g/dL (ref 30.0–36.0)
MCV: 83.5 fL (ref 80.0–100.0)
Monocytes Absolute: 0.6 10*3/uL (ref 0.1–1.0)
Monocytes Relative: 10 %
Neutro Abs: 4.2 10*3/uL (ref 1.7–7.7)
Neutrophils Relative %: 71 %
Platelet Count: 146 10*3/uL — ABNORMAL LOW (ref 150–400)
RBC: 4.24 MIL/uL (ref 4.22–5.81)
RDW: 15.4 % (ref 11.5–15.5)
WBC Count: 5.8 10*3/uL (ref 4.0–10.5)
nRBC: 0 % (ref 0.0–0.2)

## 2020-04-28 LAB — CMP (CANCER CENTER ONLY)
ALT: 19 U/L (ref 0–44)
AST: 13 U/L — ABNORMAL LOW (ref 15–41)
Albumin: 3.4 g/dL — ABNORMAL LOW (ref 3.5–5.0)
Alkaline Phosphatase: 116 U/L (ref 38–126)
Anion gap: 10 (ref 5–15)
BUN: 18 mg/dL (ref 6–20)
CO2: 22 mmol/L (ref 22–32)
Calcium: 9.4 mg/dL (ref 8.9–10.3)
Chloride: 105 mmol/L (ref 98–111)
Creatinine: 1.26 mg/dL — ABNORMAL HIGH (ref 0.61–1.24)
GFR, Est AFR Am: >60 mL/min (ref >60)
GFR, Estimated: >60 mL/min (ref >60)
Glucose, Bld: 250 mg/dL — ABNORMAL HIGH (ref 70–99)
Potassium: 4.5 mmol/L (ref 3.5–5.1)
Sodium: 137 mmol/L (ref 135–145)
Total Bilirubin: 0.4 mg/dL (ref 0.3–1.2)
Total Protein: 7.1 g/dL (ref 6.5–8.1)

## 2020-04-28 MED ORDER — DIPHENHYDRAMINE HCL 50 MG/ML IJ SOLN
50.0000 mg | Freq: Once | INTRAMUSCULAR | Status: AC
  Administered 2020-04-28: 50 mg via INTRAVENOUS

## 2020-04-28 MED ORDER — SODIUM CHLORIDE 0.9 % IV SOLN
Freq: Once | INTRAVENOUS | Status: AC
  Filled 2020-04-28: qty 250

## 2020-04-28 MED ORDER — SODIUM CHLORIDE 0.9 % IV SOLN
20.0000 mg | Freq: Once | INTRAVENOUS | Status: AC
  Administered 2020-04-28: 20 mg via INTRAVENOUS
  Filled 2020-04-28: qty 20

## 2020-04-28 MED ORDER — SODIUM CHLORIDE 0.9 % IV SOLN
240.0000 mg | Freq: Once | INTRAVENOUS | Status: AC
  Administered 2020-04-28: 240 mg via INTRAVENOUS
  Filled 2020-04-28: qty 24

## 2020-04-28 MED ORDER — PALONOSETRON HCL INJECTION 0.25 MG/5ML
INTRAVENOUS | Status: AC
  Filled 2020-04-28: qty 5

## 2020-04-28 MED ORDER — PALONOSETRON HCL INJECTION 0.25 MG/5ML
0.2500 mg | Freq: Once | INTRAVENOUS | Status: AC
  Administered 2020-04-28: 0.25 mg via INTRAVENOUS

## 2020-04-28 MED ORDER — FAMOTIDINE IN NACL 20-0.9 MG/50ML-% IV SOLN
INTRAVENOUS | Status: AC
  Filled 2020-04-28: qty 50

## 2020-04-28 MED ORDER — FAMOTIDINE IN NACL 20-0.9 MG/50ML-% IV SOLN
20.0000 mg | Freq: Once | INTRAVENOUS | Status: AC
  Administered 2020-04-28: 20 mg via INTRAVENOUS

## 2020-04-28 MED ORDER — DIPHENHYDRAMINE HCL 50 MG/ML IJ SOLN
INTRAMUSCULAR | Status: AC
  Filled 2020-04-28: qty 1

## 2020-04-28 MED ORDER — ONDANSETRON HCL 4 MG/2ML IJ SOLN: 8.0000 mg | Freq: Once | INTRAMUSCULAR | Status: DC

## 2020-04-28 MED ORDER — SODIUM CHLORIDE 0.9 % IV SOLN
45.0000 mg/m2 | Freq: Once | INTRAVENOUS | Status: AC
  Administered 2020-04-28: 102 mg via INTRAVENOUS
  Filled 2020-04-28: qty 17

## 2020-04-28 NOTE — Patient Instructions (Signed)
[facility] Discharge Instructions for Patients Receiving Chemotherapy  Today you received the following chemotherapy agents: Paclitaxel (Taxol) and Carboplatin.  To help prevent nausea and vomiting after your treatment, we encourage you to take your nausea medication as directed by your MD.   If you develop nausea and vomiting that is not controlled by your nausea medication, call the clinic.   BELOW ARE SYMPTOMS THAT SHOULD BE REPORTED IMMEDIATELY:  *FEVER GREATER THAN 100.5 F  *CHILLS WITH OR WITHOUT FEVER  NAUSEA AND VOMITING THAT IS NOT CONTROLLED WITH YOUR NAUSEA MEDICATION  *UNUSUAL SHORTNESS OF BREATH  *UNUSUAL BRUISING OR BLEEDING  TENDERNESS IN MOUTH AND THROAT WITH OR WITHOUT PRESENCE OF ULCERS  *URINARY PROBLEMS  *BOWEL PROBLEMS  UNUSUAL RASH Items with * indicate a potential emergency and should be followed up as soon as possible.  Feel free to call the clinic should you have any questions or concerns. The clinic phone number is [phone].  Please show the CHEMO ALERT CARD at check-in to the Emergency Department and triage nurse.    

## 2020-04-29 ENCOUNTER — Ambulatory Visit: Payer: BC Managed Care – PPO

## 2020-04-29 ENCOUNTER — Other Ambulatory Visit: Payer: Self-pay

## 2020-04-29 ENCOUNTER — Ambulatory Visit
Admission: RE | Admit: 2020-04-29 | Discharge: 2020-04-29 | Disposition: A | Discharge status: Home / Self Care | Payer: BC Managed Care – PPO | Source: Ambulatory Visit | Attending: Radiation Oncology | Admitting: Radiation Oncology

## 2020-04-29 ENCOUNTER — Other Ambulatory Visit: Payer: BC Managed Care – PPO

## 2020-04-29 DIAGNOSIS — Z510 Encounter for antineoplastic radiation therapy: Secondary | ICD-10-CM | POA: Diagnosis not present

## 2020-04-29 DIAGNOSIS — C3411 Malignant neoplasm of upper lobe, right bronchus or lung: Secondary | ICD-10-CM | POA: Diagnosis not present

## 2020-04-30 ENCOUNTER — Ambulatory Visit
Admission: RE | Admit: 2020-04-30 | Discharge: 2020-04-30 | Disposition: A | Discharge status: Home / Self Care | Payer: BC Managed Care – PPO | Source: Ambulatory Visit | Attending: Radiation Oncology | Admitting: Radiation Oncology

## 2020-04-30 ENCOUNTER — Other Ambulatory Visit: Payer: Self-pay

## 2020-04-30 DIAGNOSIS — Z510 Encounter for antineoplastic radiation therapy: Secondary | ICD-10-CM | POA: Diagnosis not present

## 2020-04-30 DIAGNOSIS — C3411 Malignant neoplasm of upper lobe, right bronchus or lung: Secondary | ICD-10-CM | POA: Diagnosis not present

## 2020-05-01 ENCOUNTER — Other Ambulatory Visit: Payer: Self-pay

## 2020-05-01 ENCOUNTER — Ambulatory Visit
Admission: RE | Admit: 2020-05-01 | Discharge: 2020-05-01 | Disposition: A | Discharge status: Home / Self Care | Payer: BC Managed Care – PPO | Source: Ambulatory Visit | Attending: Radiation Oncology | Admitting: Radiation Oncology

## 2020-05-01 DIAGNOSIS — Z510 Encounter for antineoplastic radiation therapy: Secondary | ICD-10-CM | POA: Diagnosis not present

## 2020-05-01 DIAGNOSIS — C3411 Malignant neoplasm of upper lobe, right bronchus or lung: Secondary | ICD-10-CM | POA: Diagnosis not present

## 2020-05-02 ENCOUNTER — Ambulatory Visit
Admission: RE | Admit: 2020-05-02 | Discharge: 2020-05-02 | Disposition: A | Discharge status: Home / Self Care | Payer: BC Managed Care – PPO | Source: Ambulatory Visit | Attending: Radiation Oncology | Admitting: Radiation Oncology

## 2020-05-02 ENCOUNTER — Other Ambulatory Visit: Payer: Self-pay

## 2020-05-02 DIAGNOSIS — Z510 Encounter for antineoplastic radiation therapy: Secondary | ICD-10-CM | POA: Diagnosis not present

## 2020-05-02 DIAGNOSIS — C3411 Malignant neoplasm of upper lobe, right bronchus or lung: Secondary | ICD-10-CM | POA: Diagnosis not present

## 2020-05-05 ENCOUNTER — Other Ambulatory Visit: Payer: Self-pay

## 2020-05-05 ENCOUNTER — Inpatient Hospital Stay: Payer: BC Managed Care – PPO

## 2020-05-05 ENCOUNTER — Inpatient Hospital Stay: Payer: BC Managed Care – PPO | Admitting: Internal Medicine

## 2020-05-05 ENCOUNTER — Encounter: Payer: Self-pay | Admitting: *Deleted

## 2020-05-05 ENCOUNTER — Encounter: Payer: Self-pay | Admitting: Internal Medicine

## 2020-05-05 ENCOUNTER — Ambulatory Visit
Admission: RE | Admit: 2020-05-05 | Discharge: 2020-05-05 | Disposition: A | Discharge status: Home / Self Care | Payer: BC Managed Care – PPO | Source: Ambulatory Visit | Attending: Radiation Oncology | Admitting: Radiation Oncology

## 2020-05-05 VITALS — BP 102/60 | HR 81 | Temp 97.3°F | Resp 17 | Ht 72.0 in | Wt 239.4 lb

## 2020-05-05 DIAGNOSIS — Z791 Long term (current) use of non-steroidal anti-inflammatories (NSAID): Secondary | ICD-10-CM | POA: Diagnosis not present

## 2020-05-05 DIAGNOSIS — Z7982 Long term (current) use of aspirin: Secondary | ICD-10-CM | POA: Diagnosis not present

## 2020-05-05 DIAGNOSIS — R590 Localized enlarged lymph nodes: Secondary | ICD-10-CM | POA: Diagnosis not present

## 2020-05-05 DIAGNOSIS — C3411 Malignant neoplasm of upper lobe, right bronchus or lung: Secondary | ICD-10-CM | POA: Diagnosis not present

## 2020-05-05 DIAGNOSIS — Z79899 Other long term (current) drug therapy: Secondary | ICD-10-CM | POA: Diagnosis not present

## 2020-05-05 DIAGNOSIS — C3490 Malignant neoplasm of unspecified part of unspecified bronchus or lung: Secondary | ICD-10-CM | POA: Diagnosis not present

## 2020-05-05 DIAGNOSIS — Z5111 Encounter for antineoplastic chemotherapy: Secondary | ICD-10-CM | POA: Diagnosis not present

## 2020-05-05 DIAGNOSIS — E1165 Type 2 diabetes mellitus with hyperglycemia: Secondary | ICD-10-CM | POA: Diagnosis not present

## 2020-05-05 DIAGNOSIS — K219 Gastro-esophageal reflux disease without esophagitis: Secondary | ICD-10-CM | POA: Diagnosis not present

## 2020-05-05 DIAGNOSIS — Z510 Encounter for antineoplastic radiation therapy: Secondary | ICD-10-CM | POA: Diagnosis not present

## 2020-05-05 DIAGNOSIS — Z7984 Long term (current) use of oral hypoglycemic drugs: Secondary | ICD-10-CM | POA: Diagnosis not present

## 2020-05-05 DIAGNOSIS — E7800 Pure hypercholesterolemia, unspecified: Secondary | ICD-10-CM | POA: Diagnosis not present

## 2020-05-05 LAB — CMP (CANCER CENTER ONLY)
ALT: 21 U/L (ref 0–44)
AST: 13 U/L — ABNORMAL LOW (ref 15–41)
Albumin: 3.5 g/dL (ref 3.5–5.0)
Alkaline Phosphatase: 105 U/L (ref 38–126)
Anion gap: 9 (ref 5–15)
BUN: 19 mg/dL (ref 6–20)
CO2: 23 mmol/L (ref 22–32)
Calcium: 8.8 mg/dL — ABNORMAL LOW (ref 8.9–10.3)
Chloride: 104 mmol/L (ref 98–111)
Creatinine: 1.14 mg/dL (ref 0.61–1.24)
GFR, Est AFR Am: >60 mL/min (ref >60)
GFR, Estimated: >60 mL/min (ref >60)
Glucose, Bld: 233 mg/dL — ABNORMAL HIGH (ref 70–99)
Potassium: 4.5 mmol/L (ref 3.5–5.1)
Sodium: 136 mmol/L (ref 135–145)
Total Bilirubin: 0.5 mg/dL (ref 0.3–1.2)
Total Protein: 6.7 g/dL (ref 6.5–8.1)

## 2020-05-05 LAB — CBC WITH DIFFERENTIAL (CANCER CENTER ONLY)
Abs Immature Granulocytes: 0.04 10*3/uL (ref 0.00–0.07)
Basophils Absolute: 0 10*3/uL (ref 0.0–0.1)
Basophils Relative: 1 %
Eosinophils Absolute: 0.1 10*3/uL (ref 0.0–0.5)
Eosinophils Relative: 3 %
HCT: 33.8 % — ABNORMAL LOW (ref 39.0–52.0)
Hemoglobin: 11.5 g/dL — ABNORMAL LOW (ref 13.0–17.0)
Immature Granulocytes: 1 %
Lymphocytes Relative: 16 %
Lymphs Abs: 0.8 10*3/uL (ref 0.7–4.0)
MCH: 27.8 pg (ref 26.0–34.0)
MCHC: 34 g/dL (ref 30.0–36.0)
MCV: 81.6 fL (ref 80.0–100.0)
Monocytes Absolute: 0.5 10*3/uL (ref 0.1–1.0)
Monocytes Relative: 10 %
Neutro Abs: 3.6 10*3/uL (ref 1.7–7.7)
Neutrophils Relative %: 69 %
Platelet Count: 115 10*3/uL — ABNORMAL LOW (ref 150–400)
RBC: 4.14 MIL/uL — ABNORMAL LOW (ref 4.22–5.81)
RDW: 15.9 % — ABNORMAL HIGH (ref 11.5–15.5)
WBC Count: 5.2 10*3/uL (ref 4.0–10.5)
nRBC: 0 % (ref 0.0–0.2)

## 2020-05-05 MED ORDER — PALONOSETRON HCL INJECTION 0.25 MG/5ML
0.2500 mg | Freq: Once | INTRAVENOUS | Status: AC
  Administered 2020-05-05: 0.25 mg via INTRAVENOUS

## 2020-05-05 MED ORDER — FAMOTIDINE IN NACL 20-0.9 MG/50ML-% IV SOLN
20.0000 mg | Freq: Once | INTRAVENOUS | Status: AC
  Administered 2020-05-05: 20 mg via INTRAVENOUS

## 2020-05-05 MED ORDER — SODIUM CHLORIDE 0.9 % IV SOLN
20.0000 mg | Freq: Once | INTRAVENOUS | Status: AC
  Administered 2020-05-05: 20 mg via INTRAVENOUS
  Filled 2020-05-05: qty 20

## 2020-05-05 MED ORDER — SODIUM CHLORIDE 0.9 % IV SOLN
260.4000 mg | Freq: Once | INTRAVENOUS | Status: AC
  Administered 2020-05-05: 260 mg via INTRAVENOUS
  Filled 2020-05-05: qty 26

## 2020-05-05 MED ORDER — SODIUM CHLORIDE 0.9 % IV SOLN
45.0000 mg/m2 | Freq: Once | INTRAVENOUS | Status: AC
  Administered 2020-05-05: 102 mg via INTRAVENOUS
  Filled 2020-05-05: qty 17

## 2020-05-05 MED ORDER — PALONOSETRON HCL INJECTION 0.25 MG/5ML
INTRAVENOUS | Status: AC
  Filled 2020-05-05: qty 5

## 2020-05-05 MED ORDER — DIPHENHYDRAMINE HCL 50 MG/ML IJ SOLN
INTRAMUSCULAR | Status: AC
  Filled 2020-05-05: qty 1

## 2020-05-05 MED ORDER — SODIUM CHLORIDE 0.9 % IV SOLN
Freq: Once | INTRAVENOUS | Status: AC
  Filled 2020-05-05: qty 250

## 2020-05-05 MED ORDER — DIPHENHYDRAMINE HCL 50 MG/ML IJ SOLN
50.0000 mg | Freq: Once | INTRAMUSCULAR | Status: AC
  Administered 2020-05-05: 50 mg via INTRAVENOUS

## 2020-05-05 MED ORDER — FAMOTIDINE IN NACL 20-0.9 MG/50ML-% IV SOLN
INTRAVENOUS | Status: AC
  Filled 2020-05-05: qty 50

## 2020-05-05 NOTE — Patient Instructions (Signed)
[facility] Discharge Instructions for Patients Receiving Chemotherapy  Today you received the following chemotherapy agents: Paclitaxel (Taxol) and Carboplatin.  To help prevent nausea and vomiting after your treatment, we encourage you to take your nausea medication as directed by your MD.   If you develop nausea and vomiting that is not controlled by your nausea medication, call the clinic.   BELOW ARE SYMPTOMS THAT SHOULD BE REPORTED IMMEDIATELY:  *FEVER GREATER THAN 100.5 F  *CHILLS WITH OR WITHOUT FEVER  NAUSEA AND VOMITING THAT IS NOT CONTROLLED WITH YOUR NAUSEA MEDICATION  *UNUSUAL SHORTNESS OF BREATH  *UNUSUAL BRUISING OR BLEEDING  TENDERNESS IN MOUTH AND THROAT WITH OR WITHOUT PRESENCE OF ULCERS  *URINARY PROBLEMS  *BOWEL PROBLEMS  UNUSUAL RASH Items with * indicate a potential emergency and should be followed up as soon as possible.  Feel free to call the clinic should you have any questions or concerns. The clinic phone number is [phone].  Please show the CHEMO ALERT CARD at check-in to the Emergency Department and triage nurse.    

## 2020-05-05 NOTE — Progress Notes (Signed)
[name] [phone]   [phone]  OFFICE PROGRESS NOTE  [name], DO [address] [place] [address]  DIAGNOSIS: Stage IIIA (T2a, N2, M0) non-small cell lung cancer, squamous cell carcinoma diagnosed in April 2021, presented with right upper lobe/suprahilar lung mass in addition to right paratracheal and subcarinal lymphadenopathy.  PRIOR THERAPY:None.  CURRENT THERAPY: Concurrent chemoradiation with weekly carboplatin for AUC of 2 and paclitaxel 45 NG/M2.  First dose on Mar 31, 2020.  Status post 5 cycles.  INTERVAL HISTORY: William Farrell 59 y.o. male returns to the clinic today for follow-up visit accompanied by his wife.  The patient is feeling fine today with no concerning complaints except for mild odynophagia and fatigue.  He denied having any current chest pain, shortness of breath, cough or hemoptysis.  He denied having any fever or chills.  He has no nausea, vomiting, diarrhea or constipation.  He denied having any headache or visual changes.  The patient continues to tolerate his course of concurrent chemoradiation fairly well.  Is here today for evaluation before starting cycle #6.  MEDICAL HISTORY: Past Medical History:  Diagnosis Date  . Asthma    as a child  . Diabetes ([place])   . Diverticulitis   . GERD (gastroesophageal reflux disease)   . High cholesterol   . History of kidney stones     ALLERGIES:  is allergic to sitagliptin.  MEDICATIONS:  Current Outpatient Medications  Medication Sig Dispense Refill  . acetaminophen (TYLENOL) 500 MG tablet Take 1,000 mg by mouth every 6 (six) hours as needed for moderate pain or headache.    . albuterol (VENTOLIN HFA) 108 (90 Base) MCG/ACT inhaler Inhale 2 puffs into the lungs every 6 (six) hours as needed for wheezing or shortness of breath.     Marland Kitchen aspirin EC 81 MG tablet Take 81 mg by mouth daily.    Marland Kitchen atorvastatin (LIPITOR) 40 MG tablet Take 1 tablet  (40 mg total) by mouth daily. 90 tablet 3  . calcium carbonate (TUMS - DOSED IN MG ELEMENTAL CALCIUM) 500 MG chewable tablet Chew 2 tablets by mouth daily as needed for indigestion or heartburn.    Marland Kitchen glipiZIDE (GLUCOTROL) 5 MG tablet Take 1 tablet (5 mg total) by mouth daily before breakfast. 90 tablet 3  . glucose blood test strip Use as instructed up to qid 100 each 99  . meloxicam (MOBIC) 15 MG tablet Take 1 tablet (15 mg total) by mouth daily. 90 tablet 3  . metFORMIN (GLUCOPHAGE) 1000 MG tablet Take 1 tablet (1,000 mg total) by mouth 2 (two) times daily with a meal. 180 tablet 3  . nicotine (NICODERM CQ - DOSED IN MG/24 HR) 7 mg/24hr patch Place 1 patch (7 mg total) onto the skin daily. 28 patch 0  . OneTouch Delica Lancets 39J MISC As directed up to qid 100 each 99  . prochlorperazine (COMPAZINE) 10 MG tablet Take 1 tablet (10 mg total) by mouth every 6 (six) hours as needed for nausea or vomiting. 30 tablet 0  . trolamine salicylate (ASPERCREME) 10 % cream Apply 1 application topically as needed for muscle pain.    . varenicline (CHANTIX STARTING MONTH PAK) 0.5 MG X 11 & 1 MG X 42 tablet Take one 0.5 mg tablet by mouth once daily for 3 days, then increase to one 0.5 mg tablet twice daily for 4 days, then increase to one 1 mg tablet twice daily. [place]  tablet 0  . varenicline (CHANTIX) 1 MG tablet Take 1 tablet (1 mg total) by mouth 2 (two) times daily. 180 tablet 1   No current facility-administered medications for this visit.    SURGICAL HISTORY:  Past Surgical History:  Procedure Laterality Date  . broken bone repair    . BRONCHIAL BIOPSY  03/11/2020   Procedure: BRONCHIAL BIOPSIES;  Surgeon: Garner Nash, DO;  Location: [facility];  Service: Pulmonary;;  . COLONOSCOPY    . CRYOTHERAPY  03/11/2020   Procedure: CRYOTHERAPY;  Surgeon: Garner Nash, DO;  Location: [facility];  Service: Pulmonary;;  . FINE NEEDLE ASPIRATION  03/11/2020   Procedure: FINE NEEDLE ASPIRATION (FNA)  LINEAR;  Surgeon: Garner Nash, DO;  Location: [facility];  Service: Pulmonary;;  . KNEE ARTHROSCOPY WITH ANTERIOR CRUCIATE LIGAMENT (ACL) REPAIR     x 2  . VIDEO BRONCHOSCOPY WITH ENDOBRONCHIAL ULTRASOUND N/A 03/11/2020   Procedure: VIDEO BRONCHOSCOPY WITH ENDOBRONCHIAL ULTRASOUND;  Surgeon: Garner Nash, DO;  Location: [facility];  Service: Pulmonary;  Laterality: N/A;    REVIEW OF SYSTEMS:  A comprehensive review of systems was negative except for: Constitutional: positive for fatigue Gastrointestinal: positive for odynophagia   PHYSICAL EXAMINATION: General appearance: alert, cooperative and no distress Head: Normocephalic, without obvious abnormality, atraumatic Neck: no adenopathy, no JVD, supple, symmetrical, trachea midline and thyroid not enlarged, symmetric, no tenderness/mass/nodules Lymph nodes: Cervical, supraclavicular, and axillary nodes normal. Resp: clear to auscultation bilaterally Back: symmetric, no curvature. ROM normal. No CVA tenderness. Cardio: regular rate and rhythm, S1, S2 normal, no murmur, click, rub or gallop GI: soft, non-tender; bowel sounds normal; no masses,  no organomegaly Extremities: extremities normal, atraumatic, no cyanosis or edema  ECOG PERFORMANCE STATUS: 1 - Symptomatic but completely ambulatory  Blood pressure 102/60, pulse 81, temperature (!) 97.3 F (36.3 C), temperature source Temporal, resp. rate 17, height 6' (1.829 m), weight 239 lb 6.4 oz (108.6 kg), SpO2 99 %.  LABORATORY DATA: Lab Results  Component Value Date   WBC 5.2 05/05/2020   HGB 11.5 (L) 05/05/2020   HCT 33.8 (L) 05/05/2020   MCV 81.6 05/05/2020   PLT 115 (L) 05/05/2020      Chemistry      Component Value Date/Time   NA 136 05/05/2020 1152   K 4.5 05/05/2020 1152   CL 104 05/05/2020 1152   CO2 23 05/05/2020 1152   BUN 19 05/05/2020 1152   CREATININE 1.14 05/05/2020 1152   CREATININE 1.10 01/14/2020 0859      Component Value Date/Time   CALCIUM 8.8  (L) 05/05/2020 1152   ALKPHOS 105 05/05/2020 1152   AST 13 (L) 05/05/2020 1152   ALT 21 05/05/2020 1152   BILITOT 0.5 05/05/2020 1152       RADIOGRAPHIC STUDIES: No results found.  ASSESSMENT AND PLAN: This is a 59 years old white male was recently diagnosed with stage IIIa non-small cell lung cancer, squamous cell carcinoma in April 2021 presented with right upper lobe/suprahilar lung mass in addition to right paratracheal and subcarinal lymphadenopathy. The patient is currently undergoing a course of concurrent chemoradiation with weekly carboplatin for AUC of 2 and paclitaxel 45 MG/M2.  He is status post 5 cycles.  He has been tolerating his treatment well with no concerning adverse effect except for mild fatigue as well as mild odynophagia. I recommended for the patient to proceed with cycle #6 today as planned. I will see him back for follow-up visit in 1 months with repeat  CT scan of the chest for restaging of his disease. The patient was advised to call immediately if he has any concerning symptoms in the interval. The patient voices understanding of current disease status and treatment options and is in agreement with the current care plan. All questions were answered. The patient knows to call the clinic with any problems, questions or concerns. We can certainly see the patient much sooner if necessary.  Disclaimer: This note was dictated with voice recognition software. Similar sounding words can inadvertently be transcribed and may not be corrected upon review.

## 2020-05-06 ENCOUNTER — Other Ambulatory Visit: Payer: BC Managed Care – PPO

## 2020-05-06 ENCOUNTER — Ambulatory Visit: Payer: BC Managed Care – PPO | Admitting: Physician Assistant

## 2020-05-06 ENCOUNTER — Ambulatory Visit: Payer: BC Managed Care – PPO

## 2020-05-06 ENCOUNTER — Ambulatory Visit
Admission: RE | Admit: 2020-05-06 | Discharge: 2020-05-06 | Disposition: A | Discharge status: Home / Self Care | Payer: BC Managed Care – PPO | Source: Ambulatory Visit | Attending: Radiation Oncology | Admitting: Radiation Oncology

## 2020-05-06 ENCOUNTER — Other Ambulatory Visit: Payer: Self-pay

## 2020-05-06 DIAGNOSIS — Z510 Encounter for antineoplastic radiation therapy: Secondary | ICD-10-CM | POA: Diagnosis not present

## 2020-05-06 DIAGNOSIS — C3411 Malignant neoplasm of upper lobe, right bronchus or lung: Secondary | ICD-10-CM | POA: Diagnosis not present

## 2020-05-07 ENCOUNTER — Other Ambulatory Visit: Payer: Self-pay

## 2020-05-07 ENCOUNTER — Ambulatory Visit
Admission: RE | Admit: 2020-05-07 | Discharge: 2020-05-07 | Disposition: A | Discharge status: Home / Self Care | Payer: BC Managed Care – PPO | Source: Ambulatory Visit | Attending: Radiation Oncology | Admitting: Radiation Oncology

## 2020-05-07 ENCOUNTER — Other Ambulatory Visit: Payer: Self-pay | Admitting: Radiation Oncology

## 2020-05-07 DIAGNOSIS — Z510 Encounter for antineoplastic radiation therapy: Secondary | ICD-10-CM | POA: Diagnosis not present

## 2020-05-07 DIAGNOSIS — C3411 Malignant neoplasm of upper lobe, right bronchus or lung: Secondary | ICD-10-CM | POA: Diagnosis not present

## 2020-05-07 MED ORDER — SUCRALFATE 1 G PO TABS
1.0000 g | ORAL_TABLET | Freq: Three times a day (TID) | ORAL | 2 refills | Status: DC
Start: 2020-05-07 — End: 2020-10-10

## 2020-05-08 ENCOUNTER — Other Ambulatory Visit: Payer: Self-pay

## 2020-05-08 ENCOUNTER — Ambulatory Visit
Admission: RE | Admit: 2020-05-08 | Discharge: 2020-05-08 | Disposition: A | Discharge status: Home / Self Care | Payer: BC Managed Care – PPO | Source: Ambulatory Visit | Attending: Radiation Oncology | Admitting: Radiation Oncology

## 2020-05-08 DIAGNOSIS — Z510 Encounter for antineoplastic radiation therapy: Secondary | ICD-10-CM | POA: Diagnosis not present

## 2020-05-08 DIAGNOSIS — C3411 Malignant neoplasm of upper lobe, right bronchus or lung: Secondary | ICD-10-CM | POA: Diagnosis not present

## 2020-05-09 ENCOUNTER — Other Ambulatory Visit: Payer: Self-pay

## 2020-05-09 ENCOUNTER — Ambulatory Visit
Admission: RE | Admit: 2020-05-09 | Discharge: 2020-05-09 | Disposition: A | Discharge status: Home / Self Care | Payer: BC Managed Care – PPO | Source: Ambulatory Visit | Attending: Radiation Oncology | Admitting: Radiation Oncology

## 2020-05-09 DIAGNOSIS — C3411 Malignant neoplasm of upper lobe, right bronchus or lung: Secondary | ICD-10-CM | POA: Diagnosis not present

## 2020-05-09 DIAGNOSIS — Z510 Encounter for antineoplastic radiation therapy: Secondary | ICD-10-CM | POA: Diagnosis not present

## 2020-05-12 ENCOUNTER — Ambulatory Visit
Admission: RE | Admit: 2020-05-12 | Discharge: 2020-05-12 | Disposition: A | Discharge status: Home / Self Care | Payer: BC Managed Care – PPO | Source: Ambulatory Visit | Attending: Radiation Oncology | Admitting: Radiation Oncology

## 2020-05-12 ENCOUNTER — Inpatient Hospital Stay: Payer: BC Managed Care – PPO

## 2020-05-12 ENCOUNTER — Other Ambulatory Visit: Payer: Self-pay

## 2020-05-12 VITALS — BP 97/71 | HR 81 | Temp 97.9°F | Resp 18

## 2020-05-12 DIAGNOSIS — Z5111 Encounter for antineoplastic chemotherapy: Secondary | ICD-10-CM | POA: Diagnosis not present

## 2020-05-12 DIAGNOSIS — E1165 Type 2 diabetes mellitus with hyperglycemia: Secondary | ICD-10-CM | POA: Diagnosis not present

## 2020-05-12 DIAGNOSIS — R590 Localized enlarged lymph nodes: Secondary | ICD-10-CM | POA: Diagnosis not present

## 2020-05-12 DIAGNOSIS — Z79899 Other long term (current) drug therapy: Secondary | ICD-10-CM | POA: Diagnosis not present

## 2020-05-12 DIAGNOSIS — C3411 Malignant neoplasm of upper lobe, right bronchus or lung: Secondary | ICD-10-CM

## 2020-05-12 DIAGNOSIS — Z7982 Long term (current) use of aspirin: Secondary | ICD-10-CM | POA: Diagnosis not present

## 2020-05-12 DIAGNOSIS — C3490 Malignant neoplasm of unspecified part of unspecified bronchus or lung: Secondary | ICD-10-CM

## 2020-05-12 DIAGNOSIS — Z7984 Long term (current) use of oral hypoglycemic drugs: Secondary | ICD-10-CM | POA: Diagnosis not present

## 2020-05-12 DIAGNOSIS — E7800 Pure hypercholesterolemia, unspecified: Secondary | ICD-10-CM | POA: Diagnosis not present

## 2020-05-12 DIAGNOSIS — Z510 Encounter for antineoplastic radiation therapy: Secondary | ICD-10-CM | POA: Diagnosis not present

## 2020-05-12 DIAGNOSIS — Z791 Long term (current) use of non-steroidal anti-inflammatories (NSAID): Secondary | ICD-10-CM | POA: Diagnosis not present

## 2020-05-12 DIAGNOSIS — K219 Gastro-esophageal reflux disease without esophagitis: Secondary | ICD-10-CM | POA: Diagnosis not present

## 2020-05-12 LAB — CMP (CANCER CENTER ONLY)
ALT: 21 U/L (ref 0–44)
AST: 13 U/L — ABNORMAL LOW (ref 15–41)
Albumin: 3.4 g/dL — ABNORMAL LOW (ref 3.5–5.0)
Alkaline Phosphatase: 101 U/L (ref 38–126)
Anion gap: 12 (ref 5–15)
BUN: 22 mg/dL — ABNORMAL HIGH (ref 6–20)
CO2: 20 mmol/L — ABNORMAL LOW (ref 22–32)
Calcium: 8.9 mg/dL (ref 8.9–10.3)
Chloride: 105 mmol/L (ref 98–111)
Creatinine: 1.24 mg/dL (ref 0.61–1.24)
GFR, Est AFR Am: >60 mL/min (ref >60)
GFR, Estimated: >60 mL/min (ref >60)
Glucose, Bld: 337 mg/dL — ABNORMAL HIGH (ref 70–99)
Potassium: 4.5 mmol/L (ref 3.5–5.1)
Sodium: 137 mmol/L (ref 135–145)
Total Bilirubin: 0.5 mg/dL (ref 0.3–1.2)
Total Protein: 6.6 g/dL (ref 6.5–8.1)

## 2020-05-12 LAB — CBC WITH DIFFERENTIAL (CANCER CENTER ONLY)
Abs Immature Granulocytes: 0.02 10*3/uL (ref 0.00–0.07)
Basophils Absolute: 0 10*3/uL (ref 0.0–0.1)
Basophils Relative: 1 %
Eosinophils Absolute: 0.2 10*3/uL (ref 0.0–0.5)
Eosinophils Relative: 4 %
HCT: 32.6 % — ABNORMAL LOW (ref 39.0–52.0)
Hemoglobin: 10.9 g/dL — ABNORMAL LOW (ref 13.0–17.0)
Immature Granulocytes: 1 %
Lymphocytes Relative: 14 %
Lymphs Abs: 0.6 10*3/uL — ABNORMAL LOW (ref 0.7–4.0)
MCH: 28.2 pg (ref 26.0–34.0)
MCHC: 33.4 g/dL (ref 30.0–36.0)
MCV: 84.5 fL (ref 80.0–100.0)
Monocytes Absolute: 0.4 10*3/uL (ref 0.1–1.0)
Monocytes Relative: 10 %
Neutro Abs: 3.1 10*3/uL (ref 1.7–7.7)
Neutrophils Relative %: 70 %
Platelet Count: 120 10*3/uL — ABNORMAL LOW (ref 150–400)
RBC: 3.86 MIL/uL — ABNORMAL LOW (ref 4.22–5.81)
RDW: 17.1 % — ABNORMAL HIGH (ref 11.5–15.5)
WBC Count: 4.4 10*3/uL (ref 4.0–10.5)
nRBC: 0 % (ref 0.0–0.2)

## 2020-05-12 MED ORDER — FAMOTIDINE IN NACL 20-0.9 MG/50ML-% IV SOLN
20.0000 mg | Freq: Once | INTRAVENOUS | Status: AC
  Administered 2020-05-12: 20 mg via INTRAVENOUS

## 2020-05-12 MED ORDER — SODIUM CHLORIDE 0.9 % IV SOLN
260.4000 mg | Freq: Once | INTRAVENOUS | Status: AC
  Administered 2020-05-12: 260 mg via INTRAVENOUS
  Filled 2020-05-12: qty 26

## 2020-05-12 MED ORDER — SODIUM CHLORIDE 0.9 % IV SOLN
20.0000 mg | Freq: Once | INTRAVENOUS | Status: AC
  Administered 2020-05-12: 20 mg via INTRAVENOUS
  Filled 2020-05-12: qty 20

## 2020-05-12 MED ORDER — PALONOSETRON HCL INJECTION 0.25 MG/5ML
0.2500 mg | Freq: Once | INTRAVENOUS | Status: AC
  Administered 2020-05-12: 0.25 mg via INTRAVENOUS

## 2020-05-12 MED ORDER — PALONOSETRON HCL INJECTION 0.25 MG/5ML
INTRAVENOUS | Status: AC
  Filled 2020-05-12: qty 5

## 2020-05-12 MED ORDER — FAMOTIDINE IN NACL 20-0.9 MG/50ML-% IV SOLN
INTRAVENOUS | Status: AC
  Filled 2020-05-12: qty 50

## 2020-05-12 MED ORDER — DIPHENHYDRAMINE HCL 50 MG/ML IJ SOLN
INTRAMUSCULAR | Status: AC
  Filled 2020-05-12: qty 1

## 2020-05-12 MED ORDER — SODIUM CHLORIDE 0.9 % IV SOLN
45.0000 mg/m2 | Freq: Once | INTRAVENOUS | Status: AC
  Administered 2020-05-12: 102 mg via INTRAVENOUS
  Filled 2020-05-12: qty 17

## 2020-05-12 MED ORDER — SODIUM CHLORIDE 0.9 % IV SOLN
Freq: Once | INTRAVENOUS | Status: AC
  Filled 2020-05-12: qty 250

## 2020-05-12 MED ORDER — DIPHENHYDRAMINE HCL 50 MG/ML IJ SOLN
50.0000 mg | Freq: Once | INTRAMUSCULAR | Status: AC
  Administered 2020-05-12: 50 mg via INTRAVENOUS

## 2020-05-12 NOTE — Patient Instructions (Signed)
[facility] Discharge Instructions for Patients Receiving Chemotherapy  Today you received the following chemotherapy agents Taxol, Carboplatin  To help prevent nausea and vomiting after your treatment, we encourage you to take your nausea medication as directed  If you develop nausea and vomiting that is not controlled by your nausea medication, call the clinic.   BELOW ARE SYMPTOMS THAT SHOULD BE REPORTED IMMEDIATELY:  *FEVER GREATER THAN 100.5 F  *CHILLS WITH OR WITHOUT FEVER  NAUSEA AND VOMITING THAT IS NOT CONTROLLED WITH YOUR NAUSEA MEDICATION  *UNUSUAL SHORTNESS OF BREATH  *UNUSUAL BRUISING OR BLEEDING  TENDERNESS IN MOUTH AND THROAT WITH OR WITHOUT PRESENCE OF ULCERS  *URINARY PROBLEMS  *BOWEL PROBLEMS  UNUSUAL RASH Items with * indicate a potential emergency and should be followed up as soon as possible.  Feel free to call the clinic should you have any questions or concerns. The clinic phone number is [phone].  Please show the CHEMO ALERT CARD at check-in to the Emergency Department and triage nurse.   

## 2020-05-13 ENCOUNTER — Other Ambulatory Visit: Payer: Self-pay

## 2020-05-13 ENCOUNTER — Ambulatory Visit
Admission: RE | Admit: 2020-05-13 | Discharge: 2020-05-13 | Disposition: A | Discharge status: Home / Self Care | Payer: BC Managed Care – PPO | Source: Ambulatory Visit | Attending: Radiation Oncology | Admitting: Radiation Oncology

## 2020-05-13 ENCOUNTER — Ambulatory Visit: Payer: BC Managed Care – PPO

## 2020-05-13 ENCOUNTER — Other Ambulatory Visit: Payer: BC Managed Care – PPO

## 2020-05-13 DIAGNOSIS — Z510 Encounter for antineoplastic radiation therapy: Secondary | ICD-10-CM | POA: Diagnosis not present

## 2020-05-13 DIAGNOSIS — C3411 Malignant neoplasm of upper lobe, right bronchus or lung: Secondary | ICD-10-CM | POA: Diagnosis not present

## 2020-05-14 ENCOUNTER — Other Ambulatory Visit: Payer: Self-pay

## 2020-05-14 ENCOUNTER — Ambulatory Visit
Admission: RE | Admit: 2020-05-14 | Discharge: 2020-05-14 | Disposition: A | Discharge status: Home / Self Care | Payer: BC Managed Care – PPO | Source: Ambulatory Visit | Attending: Radiation Oncology | Admitting: Radiation Oncology

## 2020-05-14 DIAGNOSIS — C3411 Malignant neoplasm of upper lobe, right bronchus or lung: Secondary | ICD-10-CM | POA: Diagnosis not present

## 2020-05-14 DIAGNOSIS — Z510 Encounter for antineoplastic radiation therapy: Secondary | ICD-10-CM | POA: Diagnosis not present

## 2020-05-15 ENCOUNTER — Ambulatory Visit
Admission: RE | Admit: 2020-05-15 | Discharge: 2020-05-15 | Disposition: A | Discharge status: Home / Self Care | Payer: BC Managed Care – PPO | Source: Ambulatory Visit | Attending: Radiation Oncology | Admitting: Radiation Oncology

## 2020-05-15 ENCOUNTER — Encounter: Payer: Self-pay | Admitting: Radiation Oncology

## 2020-05-15 ENCOUNTER — Other Ambulatory Visit: Payer: Self-pay

## 2020-05-15 DIAGNOSIS — Z510 Encounter for antineoplastic radiation therapy: Secondary | ICD-10-CM | POA: Insufficient documentation

## 2020-05-15 DIAGNOSIS — C3411 Malignant neoplasm of upper lobe, right bronchus or lung: Secondary | ICD-10-CM | POA: Insufficient documentation

## 2020-05-16 ENCOUNTER — Encounter: Payer: Self-pay | Admitting: Medical Oncology

## 2020-05-26 ENCOUNTER — Encounter: Payer: Self-pay | Admitting: *Deleted

## 2020-05-30 ENCOUNTER — Other Ambulatory Visit: Payer: Self-pay

## 2020-05-30 DIAGNOSIS — C3411 Malignant neoplasm of upper lobe, right bronchus or lung: Secondary | ICD-10-CM

## 2020-06-02 ENCOUNTER — Ambulatory Visit (INDEPENDENT_AMBULATORY_CARE_PROVIDER_SITE_OTHER): Payer: BC Managed Care – PPO

## 2020-06-02 ENCOUNTER — Other Ambulatory Visit: Payer: Self-pay

## 2020-06-02 ENCOUNTER — Inpatient Hospital Stay: Payer: BC Managed Care – PPO | Attending: Internal Medicine

## 2020-06-02 DIAGNOSIS — Z791 Long term (current) use of non-steroidal anti-inflammatories (NSAID): Secondary | ICD-10-CM | POA: Insufficient documentation

## 2020-06-02 DIAGNOSIS — E119 Type 2 diabetes mellitus without complications: Secondary | ICD-10-CM | POA: Diagnosis not present

## 2020-06-02 DIAGNOSIS — Z5112 Encounter for antineoplastic immunotherapy: Secondary | ICD-10-CM | POA: Diagnosis not present

## 2020-06-02 DIAGNOSIS — R1310 Dysphagia, unspecified: Secondary | ICD-10-CM | POA: Insufficient documentation

## 2020-06-02 DIAGNOSIS — E7800 Pure hypercholesterolemia, unspecified: Secondary | ICD-10-CM | POA: Insufficient documentation

## 2020-06-02 DIAGNOSIS — K219 Gastro-esophageal reflux disease without esophagitis: Secondary | ICD-10-CM | POA: Diagnosis not present

## 2020-06-02 DIAGNOSIS — C3411 Malignant neoplasm of upper lobe, right bronchus or lung: Secondary | ICD-10-CM

## 2020-06-02 DIAGNOSIS — C3490 Malignant neoplasm of unspecified part of unspecified bronchus or lung: Secondary | ICD-10-CM | POA: Diagnosis not present

## 2020-06-02 DIAGNOSIS — Z79899 Other long term (current) drug therapy: Secondary | ICD-10-CM | POA: Insufficient documentation

## 2020-06-02 DIAGNOSIS — Z7982 Long term (current) use of aspirin: Secondary | ICD-10-CM | POA: Diagnosis not present

## 2020-06-02 DIAGNOSIS — Z7984 Long term (current) use of oral hypoglycemic drugs: Secondary | ICD-10-CM | POA: Diagnosis not present

## 2020-06-02 LAB — CMP (CANCER CENTER ONLY)
ALT: 22 U/L (ref 0–44)
AST: 19 U/L (ref 15–41)
Albumin: 3.8 g/dL (ref 3.5–5.0)
Alkaline Phosphatase: 101 U/L (ref 38–126)
Anion gap: 8 (ref 5–15)
BUN: 17 mg/dL (ref 6–20)
CO2: 23 mmol/L (ref 22–32)
Calcium: 9.4 mg/dL (ref 8.9–10.3)
Chloride: 110 mmol/L (ref 98–111)
Creatinine: 1.19 mg/dL (ref 0.61–1.24)
GFR, Est AFR Am: >60 mL/min (ref >60)
GFR, Estimated: >60 mL/min (ref >60)
Glucose, Bld: 173 mg/dL — ABNORMAL HIGH (ref 70–99)
Potassium: 4.2 mmol/L (ref 3.5–5.1)
Sodium: 141 mmol/L (ref 135–145)
Total Bilirubin: 0.4 mg/dL (ref 0.3–1.2)
Total Protein: 7.2 g/dL (ref 6.5–8.1)

## 2020-06-02 MED ORDER — IOPAMIDOL (ISOVUE-300) INJECTION 61%
100.0000 mL | Freq: Once | INTRAVENOUS | Status: AC | PRN
Start: 2020-06-02 — End: 2020-06-02
  Administered 2020-06-02: 75 mL via INTRAVENOUS

## 2020-06-04 ENCOUNTER — Encounter: Payer: Self-pay | Admitting: Internal Medicine

## 2020-06-04 ENCOUNTER — Encounter: Payer: Self-pay | Admitting: *Deleted

## 2020-06-04 ENCOUNTER — Inpatient Hospital Stay: Payer: BC Managed Care – PPO | Admitting: Internal Medicine

## 2020-06-04 ENCOUNTER — Other Ambulatory Visit: Payer: Self-pay

## 2020-06-04 VITALS — BP 102/75 | HR 100 | Temp 97.6°F | Resp 18 | Ht 72.0 in | Wt 234.6 lb

## 2020-06-04 DIAGNOSIS — C3411 Malignant neoplasm of upper lobe, right bronchus or lung: Secondary | ICD-10-CM

## 2020-06-04 DIAGNOSIS — Z79899 Other long term (current) drug therapy: Secondary | ICD-10-CM | POA: Diagnosis not present

## 2020-06-04 DIAGNOSIS — Z7984 Long term (current) use of oral hypoglycemic drugs: Secondary | ICD-10-CM | POA: Diagnosis not present

## 2020-06-04 DIAGNOSIS — Z5111 Encounter for antineoplastic chemotherapy: Secondary | ICD-10-CM

## 2020-06-04 DIAGNOSIS — Z5112 Encounter for antineoplastic immunotherapy: Secondary | ICD-10-CM | POA: Diagnosis not present

## 2020-06-04 DIAGNOSIS — Z7982 Long term (current) use of aspirin: Secondary | ICD-10-CM | POA: Diagnosis not present

## 2020-06-04 DIAGNOSIS — E119 Type 2 diabetes mellitus without complications: Secondary | ICD-10-CM | POA: Diagnosis not present

## 2020-06-04 DIAGNOSIS — E7800 Pure hypercholesterolemia, unspecified: Secondary | ICD-10-CM | POA: Diagnosis not present

## 2020-06-04 DIAGNOSIS — Z7189 Other specified counseling: Secondary | ICD-10-CM | POA: Diagnosis not present

## 2020-06-04 DIAGNOSIS — K219 Gastro-esophageal reflux disease without esophagitis: Secondary | ICD-10-CM | POA: Diagnosis not present

## 2020-06-04 DIAGNOSIS — Z791 Long term (current) use of non-steroidal anti-inflammatories (NSAID): Secondary | ICD-10-CM | POA: Diagnosis not present

## 2020-06-04 DIAGNOSIS — R1310 Dysphagia, unspecified: Secondary | ICD-10-CM | POA: Diagnosis not present

## 2020-06-04 NOTE — Progress Notes (Signed)
Oncology Nurse Navigator Documentation  Oncology Nurse Navigator Flowsheets 06/04/2020  Abnormal Finding Date -  Confirmed Diagnosis Date -  Diagnosis Status -  Planned Course of Treatment Targeted Therapy  Phase of Treatment Targeted Therapy  Chemotherapy Actual Start Date: -  Chemotherapy Expected End Date: -  Radiation Actual Start Date: -  Radiation Expected End Date: -  Targeted Therapy Actual Start Date: 06/11/2020  Navigator Follow Up Date: 06/09/2020  Navigator Follow Up Reason: Appointment Review  Navigation Complete Date: -  Post Navigation: Continue to Follow Patient? -  Reason Not Navigating Patient: -  Navigator Location [facility]  Referral Date to RadOnc/MedOnc -  Navigator Encounter Type Clinic/MDC/spoke to patient and wife today at his med onc visit with Dr.Mohamed.  I gave information on targeted therapy.    Telephone -  Treatment Initiated Date -  Patient Visit Type MedOnc  Treatment Phase -  Barriers/Navigation Needs Education  Education Other  Interventions Education;Psycho-Social Support  Acuity Level 2-Minimal Needs (1-2 Barriers Identified)  Coordination of Care -  Education Method Verbal;Written  Time Spent with Patient 15

## 2020-06-04 NOTE — Progress Notes (Signed)
DISCONTINUE ON PATHWAY REGIMEN - Non-Small Cell Lung     Administer weekly:     Paclitaxel      Carboplatin   **Always confirm dose/schedule in your pharmacy ordering system**  REASON: Continuation Of Treatment PRIOR TREATMENT: [id number]: Carboplatin AUC=2 + Paclitaxel 45 mg/m2 Weekly During Radiation TREATMENT RESPONSE: Partial Response (PR)  START ON PATHWAY REGIMEN - Non-Small Cell Lung     A cycle is every 28 days:     Durvalumab   **Always confirm dose/schedule in your pharmacy ordering system**  Patient Characteristics: Preoperative or Nonsurgical Candidate (Clinical Staging), Stage III - Nonsurgical Candidate (Nonsquamous and Squamous), PS = 0, 1 Therapeutic Status: Preoperative or Nonsurgical Candidate (Clinical Staging) AJCC T Category: cT2a AJCC N Category: cN2 AJCC M Category: cM0 AJCC 8 Stage Grouping: IIIA ECOG Performance Status: 0 Intent of Therapy: Curative Intent, Discussed with Patient

## 2020-06-04 NOTE — Progress Notes (Signed)
[facility] Telephone:([phone]   Fax:(336) [phone]  OFFICE PROGRESS NOTE  [name], DO [address] [place] [address]  DIAGNOSIS: Stage IIIA (T2a, N2, M0) non-small cell lung cancer, squamous cell carcinoma diagnosed in April 2021, presented with right upper lobe/suprahilar lung mass in addition to right paratracheal and subcarinal lymphadenopathy.  PRIOR THERAPY: Concurrent chemoradiation with weekly carboplatin for AUC of 2 and paclitaxel 45 NG/M2.  First dose on Mar 31, 2020.  Status post 7 cycles.  Last dose was given on 05/12/2020 with partial response.  CURRENT THERAPY: Consolidation immunotherapy with durvalumab 1500 mg IV every 4 weeks.  First dose May 12, 2020.  INTERVAL HISTORY: William Farrell 59 y.o. male returns to the clinic today for follow-up visit accompanied by his wife.  The patient is feeling fine today with no concerning complaints except for mild dysphagia to solid food.  He denied having any current chest pain, shortness of breath, cough or hemoptysis.  He denied having any nausea, vomiting, diarrhea or constipation.  He has no headache or visual changes.  He denied having any weight loss or night sweats.  He tolerated the previous course of concurrent chemoradiation fairly well.  The patient has repeat CT scan of the chest performed recently and is here for evaluation and discussion of his scan results.  MEDICAL HISTORY: Past Medical History:  Diagnosis Date  . Asthma    as a child  . Diabetes ([place])   . Diverticulitis   . GERD (gastroesophageal reflux disease)   . High cholesterol   . History of kidney stones     ALLERGIES:  is allergic to sitagliptin.  MEDICATIONS:  Current Outpatient Medications  Medication Sig Dispense Refill  . acetaminophen (TYLENOL) 500 MG tablet Take 1,000 mg by mouth every 6 (six) hours as needed for moderate pain or headache.    . albuterol (VENTOLIN HFA) 108 (90 Base) MCG/ACT  inhaler Inhale 2 puffs into the lungs every 6 (six) hours as needed for wheezing or shortness of breath.     Marland Kitchen aspirin EC 81 MG tablet Take 81 mg by mouth daily.    Marland Kitchen atorvastatin (LIPITOR) 40 MG tablet Take 1 tablet (40 mg total) by mouth daily. 90 tablet 3  . calcium carbonate (TUMS - DOSED IN MG ELEMENTAL CALCIUM) 500 MG chewable tablet Chew 2 tablets by mouth daily as needed for indigestion or heartburn.    Marland Kitchen glipiZIDE (GLUCOTROL) 5 MG tablet Take 1 tablet (5 mg total) by mouth daily before breakfast. 90 tablet 3  . glucose blood test strip Use as instructed up to qid 100 each 99  . meloxicam (MOBIC) 15 MG tablet Take 1 tablet (15 mg total) by mouth daily. 90 tablet 3  . metFORMIN (GLUCOPHAGE) 1000 MG tablet Take 1 tablet (1,000 mg total) by mouth 2 (two) times daily with a meal. 180 tablet 3  . OneTouch Delica Lancets 91M MISC As directed up to qid 100 each 99  . prochlorperazine (COMPAZINE) 10 MG tablet Take 1 tablet (10 mg total) by mouth every 6 (six) hours as needed for nausea or vomiting. 30 tablet 0  . sucralfate (CARAFATE) 1 g tablet Take 1 tablet (1 g total) by mouth 4 (four) times daily -  with meals and at bedtime. Crush 1 tablet in 1 oz water and drink 5 min before meals for radiation induced esophagitis 120 tablet 2  . trolamine salicylate (ASPERCREME) 10 % cream Apply 1  application topically as needed for muscle pain.     No current facility-administered medications for this visit.    SURGICAL HISTORY:  Past Surgical History:  Procedure Laterality Date  . broken bone repair    . BRONCHIAL BIOPSY  03/11/2020   Procedure: BRONCHIAL BIOPSIES;  Surgeon: Garner Nash, DO;  Location: [facility];  Service: Pulmonary;;  . COLONOSCOPY    . CRYOTHERAPY  03/11/2020   Procedure: CRYOTHERAPY;  Surgeon: Garner Nash, DO;  Location: [facility];  Service: Pulmonary;;  . FINE NEEDLE ASPIRATION  03/11/2020   Procedure: FINE NEEDLE ASPIRATION (FNA) LINEAR;  Surgeon: Garner Nash, DO;  Location: [facility];  Service: Pulmonary;;  . KNEE ARTHROSCOPY WITH ANTERIOR CRUCIATE LIGAMENT (ACL) REPAIR     x 2  . VIDEO BRONCHOSCOPY WITH ENDOBRONCHIAL ULTRASOUND N/A 03/11/2020   Procedure: VIDEO BRONCHOSCOPY WITH ENDOBRONCHIAL ULTRASOUND;  Surgeon: Garner Nash, DO;  Location: [facility];  Service: Pulmonary;  Laterality: N/A;    REVIEW OF SYSTEMS:  Constitutional: negative Eyes: negative Ears, nose, mouth, throat, and face: negative Respiratory: negative Cardiovascular: negative Gastrointestinal: positive for dysphagia Genitourinary:negative Integument/breast: negative Hematologic/lymphatic: negative Musculoskeletal:negative Neurological: negative Behavioral/Psych: negative Endocrine: negative Allergic/Immunologic: negative   PHYSICAL EXAMINATION: General appearance: alert, cooperative and no distress Head: Normocephalic, without obvious abnormality, atraumatic Neck: no adenopathy, no JVD, supple, symmetrical, trachea midline and thyroid not enlarged, symmetric, no tenderness/mass/nodules Lymph nodes: Cervical, supraclavicular, and axillary nodes normal. Resp: clear to auscultation bilaterally Back: symmetric, no curvature. ROM normal. No CVA tenderness. Cardio: regular rate and rhythm, S1, S2 normal, no murmur, click, rub or gallop GI: soft, non-tender; bowel sounds normal; no masses,  no organomegaly Extremities: extremities normal, atraumatic, no cyanosis or edema Neurologic: Alert and oriented X 3, normal strength and tone. Normal symmetric reflexes. Normal coordination and gait  ECOG PERFORMANCE STATUS: 1 - Symptomatic but completely ambulatory  Blood pressure 102/75, pulse 100, temperature 97.6 F (36.4 C), temperature source Temporal, resp. rate 18, height 6' (1.829 m), weight 234 lb 9.6 oz (106.4 kg), SpO2 99 %.  LABORATORY DATA: Lab Results  Component Value Date   WBC 4.4 05/12/2020   HGB 10.9 (L) 05/12/2020   HCT 32.6 (L) 05/12/2020   MCV  84.5 05/12/2020   PLT 120 (L) 05/12/2020      Chemistry      Component Value Date/Time   NA 141 06/02/2020 1019   K 4.2 06/02/2020 1019   CL 110 06/02/2020 1019   CO2 23 06/02/2020 1019   BUN 17 06/02/2020 1019   CREATININE 1.19 06/02/2020 1019   CREATININE 1.10 01/14/2020 0859      Component Value Date/Time   CALCIUM 9.4 06/02/2020 1019   ALKPHOS 101 06/02/2020 1019   AST 19 06/02/2020 1019   ALT 22 06/02/2020 1019   BILITOT 0.4 06/02/2020 1019       RADIOGRAPHIC STUDIES: CT Chest W Contrast  Result Date: 06/02/2020 CLINICAL DATA:  Non-small cell lung cancer staging. EXAM: CT CHEST WITH CONTRAST TECHNIQUE: Multidetector CT imaging of the chest was performed during intravenous contrast administration. CONTRAST:  30mL ISOVUE-300 IOPAMIDOL (ISOVUE-300) INJECTION 61% COMPARISON:  03/05/2020 FINDINGS: Cardiovascular: Calcified and noncalcified atheromatous plaque of the thoracic aorta without aneurysmal dilation. Heart size is normal. Central pulmonary vasculature is of normal caliber, unremarkable on venous phase assessment. Mediastinum/Nodes: Diminished size of mediastinal lymph nodes. (Image 18, series 2) 12 mm RIGHT paratracheal lymph node previously 16 mm. (Image 27, series 2) 11 mm subcarinal lymph node previously 14 mm.  RIGHT hilar nodal tissue is mildly enlarged suggestion of diminished size since previous exam where it was difficult to measure is discrete nodal tissue. RIGHT hilar lymph node at 10 mm short axis. Bulky soft tissue in the RIGHT suprahilar region previously measured at 3.8 x 2.9 cm is no longer confluent, at best measuring approximately 1.3 x 1.1 cm with some peripheral bronchial wall thickening and mild bronchial dilation. No axillary adenopathy. Thoracic inlet structures are normal. Esophagus grossly normal. Lungs/Pleura: Airways are patent. No consolidation or pleural effusion. Fissural nodularity distorting the major fissure in the RIGHT chest at the fissural  confluence is diminished. This area measures approximately 7 mm as compared to 12 mm in greatest axial dimension. No new areas of nodularity. Upper Abdomen: Incidental imaging of upper abdominal contents is unremarkable. Musculoskeletal: No acute or destructive bone process. IMPRESSION: 1. Diminished size of mediastinal and RIGHT hilar lymph nodes. 2. Diminished size of RIGHT juxta hilar mass and adjacent fissural nodularity. 3. No new areas of disease still with some fissural distortion and septal thickening. 4. Aortic atherosclerosis. Aortic Atherosclerosis (ICD10-I70.0). Electronically Signed   By: Zetta Bills M.D.   On: 06/02/2020 16:31    ASSESSMENT AND PLAN: This is a 59 years old white male was recently diagnosed with stage IIIa non-small cell lung cancer, squamous cell carcinoma in April 2021 presented with right upper lobe/suprahilar lung mass in addition to right paratracheal and subcarinal lymphadenopathy. The patient completed a course of concurrent chemoradiation with weekly carboplatin for AUC of 2 and paclitaxel 45 MG/M2.  He is status post 7 cycles.  He tolerated the previous course of his treatment well except for mild dysphagia and odynophagia. The patient had repeat CT scan of the chest performed recently.  I personally and independently reviewed the scan images and discussed the results with the patient and his wife. His scan showed improvement of his disease with decrease in the size of the right hilar mass as well as the mediastinal lymphadenopathy. I discussed with the patient his treatment options including observation and monitoring versus treatment with consolidation immunotherapy with Imfinzi 1500 mg IV every 4 weeks.  I discussed with the patient his prognosis with and without treatment.  He is interested in proceeding with the consolidation immunotherapy and he is expected to start the first cycle of this treatment on 06/11/2020. I discussed with the patient the adverse effect  of this treatment including but not limited to immunotherapy mediated skin rash, diarrhea, inflammation of the lung, kidney, liver, thyroid or other endocrine dysfunction. The patient will come back for follow-up visit in 5 weeks for evaluation before starting cycle #2. He was advised to call immediately if he has any other concerning symptoms in the interval. The patient voices understanding of current disease status and treatment options and is in agreement with the current care plan. All questions were answered. The patient knows to call the clinic with any problems, questions or concerns. We can certainly see the patient much sooner if necessary.  Disclaimer: This note was dictated with voice recognition software. Similar sounding words can inadvertently be transcribed and may not be corrected upon review.

## 2020-06-10 ENCOUNTER — Telehealth: Payer: Self-pay | Admitting: *Deleted

## 2020-06-10 NOTE — Telephone Encounter (Signed)
Patient requested listing of all medications he has been given here at the cancer center for [place] purposes.  He also needed a listing of the cost.  Advised him to reach out to Billing Department for that.  Emailed via secure flow sheet of drugs to patient.  New treatment plan has not been authorized.  Pending LOS from Dr. Julien Nordmann to get scheduled.

## 2020-06-11 ENCOUNTER — Telehealth: Payer: Self-pay | Admitting: Internal Medicine

## 2020-06-11 ENCOUNTER — Other Ambulatory Visit: Payer: Self-pay | Admitting: Radiation Oncology

## 2020-06-11 ENCOUNTER — Telehealth: Payer: Self-pay | Admitting: Radiation Oncology

## 2020-06-11 NOTE — Telephone Encounter (Signed)
  Radiation Oncology         [phone]) [phone] ________________________________  Name: William Farrell MRN: [id number]  Date of Service: 06/11/2020  DOB: 29-Nov-1960  Post Treatment Telephone Note  Diagnosis:   Stage IIIA, cT2aN2M0, NSCLC, squamous cell carcinoma of the RUL.  Interval Since Last Radiation:  4 weeks   03/31/20-05/15/20: The right lung target was was treated to 60 Gy in 30 fractions followed by a 6 Gy boost in 3 fractions.  Narrative:  The patient was contacted today for routine follow-up. During treatment he did very well with radiotherapy and did not have significant desquamation or esophagitis. He reports he is doing well and is getting ready for immunotherapy to start later this week.  Impression/Plan: 1. Stage IIIA, cT2aN2M0, NSCLC, squamous cell carcinoma of the RUL. The patient has been doing well since completion of radiotherapy. We discussed that we would be happy to continue to follow him as needed, but he will also continue to follow up with Dr. Julien Nordmann in medical oncology.     Carola Rhine, PAC

## 2020-06-11 NOTE — Telephone Encounter (Signed)
Scheduled appt per 7/28 sch msg - pt is aware of appt date and time.

## 2020-06-12 ENCOUNTER — Telehealth: Payer: Self-pay | Admitting: Physician Assistant

## 2020-06-12 ENCOUNTER — Other Ambulatory Visit: Payer: Self-pay | Admitting: Physician Assistant

## 2020-06-12 DIAGNOSIS — R918 Other nonspecific abnormal finding of lung field: Secondary | ICD-10-CM

## 2020-06-12 NOTE — Telephone Encounter (Signed)
The patient was on my schedule for tomorrow. He does not need to be seen since he is receiving first time Imfinzi and the treatment was reviewed with him at his appointment last week. I called him to tell him that since his infusion was at 9:30, that I moved his lab appointment from 8 AM to 9 AM. He expressed understanding.

## 2020-06-13 ENCOUNTER — Inpatient Hospital Stay: Payer: BC Managed Care – PPO

## 2020-06-13 ENCOUNTER — Other Ambulatory Visit: Payer: Self-pay

## 2020-06-13 ENCOUNTER — Inpatient Hospital Stay: Payer: BC Managed Care – PPO | Admitting: Physician Assistant

## 2020-06-13 VITALS — BP 117/71 | HR 87 | Temp 98.2°F | Resp 18 | Ht 72.0 in | Wt 240.0 lb

## 2020-06-13 DIAGNOSIS — Z7982 Long term (current) use of aspirin: Secondary | ICD-10-CM | POA: Diagnosis not present

## 2020-06-13 DIAGNOSIS — C3411 Malignant neoplasm of upper lobe, right bronchus or lung: Secondary | ICD-10-CM

## 2020-06-13 DIAGNOSIS — R918 Other nonspecific abnormal finding of lung field: Secondary | ICD-10-CM

## 2020-06-13 DIAGNOSIS — Z79899 Other long term (current) drug therapy: Secondary | ICD-10-CM | POA: Diagnosis not present

## 2020-06-13 DIAGNOSIS — E119 Type 2 diabetes mellitus without complications: Secondary | ICD-10-CM | POA: Diagnosis not present

## 2020-06-13 DIAGNOSIS — Z791 Long term (current) use of non-steroidal anti-inflammatories (NSAID): Secondary | ICD-10-CM | POA: Diagnosis not present

## 2020-06-13 DIAGNOSIS — K219 Gastro-esophageal reflux disease without esophagitis: Secondary | ICD-10-CM | POA: Diagnosis not present

## 2020-06-13 DIAGNOSIS — E7800 Pure hypercholesterolemia, unspecified: Secondary | ICD-10-CM | POA: Diagnosis not present

## 2020-06-13 DIAGNOSIS — R1310 Dysphagia, unspecified: Secondary | ICD-10-CM | POA: Diagnosis not present

## 2020-06-13 DIAGNOSIS — Z7984 Long term (current) use of oral hypoglycemic drugs: Secondary | ICD-10-CM | POA: Diagnosis not present

## 2020-06-13 DIAGNOSIS — Z5112 Encounter for antineoplastic immunotherapy: Secondary | ICD-10-CM | POA: Diagnosis not present

## 2020-06-13 LAB — CBC WITH DIFFERENTIAL (CANCER CENTER ONLY)
Abs Immature Granulocytes: 0.08 10*3/uL — ABNORMAL HIGH (ref 0.00–0.07)
Basophils Absolute: 0.1 10*3/uL (ref 0.0–0.1)
Basophils Relative: 1 %
Eosinophils Absolute: 0.2 10*3/uL (ref 0.0–0.5)
Eosinophils Relative: 3 %
HCT: 33.2 % — ABNORMAL LOW (ref 39.0–52.0)
Hemoglobin: 11.4 g/dL — ABNORMAL LOW (ref 13.0–17.0)
Immature Granulocytes: 1 %
Lymphocytes Relative: 15 %
Lymphs Abs: 0.8 10*3/uL (ref 0.7–4.0)
MCH: 29.8 pg (ref 26.0–34.0)
MCHC: 34.3 g/dL (ref 30.0–36.0)
MCV: 86.7 fL (ref 80.0–100.0)
Monocytes Absolute: 0.7 10*3/uL (ref 0.1–1.0)
Monocytes Relative: 12 %
Neutro Abs: 3.8 10*3/uL (ref 1.7–7.7)
Neutrophils Relative %: 68 %
Platelet Count: 188 10*3/uL (ref 150–400)
RBC: 3.83 MIL/uL — ABNORMAL LOW (ref 4.22–5.81)
RDW: 18.2 % — ABNORMAL HIGH (ref 11.5–15.5)
WBC Count: 5.6 10*3/uL (ref 4.0–10.5)
nRBC: 0 % (ref 0.0–0.2)

## 2020-06-13 LAB — CMP (CANCER CENTER ONLY)
ALT: 23 U/L (ref 0–44)
AST: 16 U/L (ref 15–41)
Albumin: 3.8 g/dL (ref 3.5–5.0)
Alkaline Phosphatase: 108 U/L (ref 38–126)
Anion gap: 7 (ref 5–15)
BUN: 17 mg/dL (ref 6–20)
CO2: 22 mmol/L (ref 22–32)
Calcium: 10 mg/dL (ref 8.9–10.3)
Chloride: 109 mmol/L (ref 98–111)
Creatinine: 1.25 mg/dL — ABNORMAL HIGH (ref 0.61–1.24)
GFR, Est AFR Am: >60 mL/min (ref >60)
GFR, Estimated: >60 mL/min (ref >60)
Glucose, Bld: 218 mg/dL — ABNORMAL HIGH (ref 70–99)
Potassium: 4.3 mmol/L (ref 3.5–5.1)
Sodium: 138 mmol/L (ref 135–145)
Total Bilirubin: 0.5 mg/dL (ref 0.3–1.2)
Total Protein: 7 g/dL (ref 6.5–8.1)

## 2020-06-13 LAB — TSH: TSH: 1.714 u[IU]/mL (ref 0.320–4.118)

## 2020-06-13 MED ORDER — SODIUM CHLORIDE 0.9 % IV SOLN
1500.0000 mg | Freq: Once | INTRAVENOUS | Status: AC
  Administered 2020-06-13: 1500 mg via INTRAVENOUS
  Filled 2020-06-13: qty 30

## 2020-06-13 MED ORDER — SODIUM CHLORIDE 0.9 % IV SOLN
Freq: Once | INTRAVENOUS | Status: AC
  Filled 2020-06-13: qty 250

## 2020-06-13 NOTE — Patient Instructions (Signed)
[facility] Discharge Instructions for Patients Receiving Chemotherapy  Today you received the following chemotherapy agents Durvalumab (IMFINZI).  To help prevent nausea and vomiting after your treatment, we encourage you to take your nausea medication as prescribed.   If you develop nausea and vomiting that is not controlled by your nausea medication, call the clinic.   BELOW ARE SYMPTOMS THAT SHOULD BE REPORTED IMMEDIATELY:  *FEVER GREATER THAN 100.5 F  *CHILLS WITH OR WITHOUT FEVER  NAUSEA AND VOMITING THAT IS NOT CONTROLLED WITH YOUR NAUSEA MEDICATION  *UNUSUAL SHORTNESS OF BREATH  *UNUSUAL BRUISING OR BLEEDING  TENDERNESS IN MOUTH AND THROAT WITH OR WITHOUT PRESENCE OF ULCERS  *URINARY PROBLEMS  *BOWEL PROBLEMS  UNUSUAL RASH Items with * indicate a potential emergency and should be followed up as soon as possible.  Feel free to call the clinic should you have any questions or concerns. The clinic phone number is [phone].  Please show the [facility] at check-in to the Emergency Department and triage nurse.  Durvalumab injection What is this medicine? DURVALUMAB (dur VAL ue mab) is a monoclonal antibody. It is used to treat urothelial cancer and lung cancer. This medicine may be used for other purposes; ask your health care provider or pharmacist if you have questions. COMMON BRAND NAME(S): IMFINZI What should I tell my health care provider before I take this medicine? They need to know if you have any of these conditions:  diabetes  immune system problems  infection  inflammatory bowel disease  kidney disease  liver disease  lung or breathing disease  lupus  organ transplant  stomach or intestine problems  thyroid disease  an unusual or allergic reaction to durvalumab, other medicines, foods, dyes, or preservatives  pregnant or trying to get pregnant  breast-feeding How should I use this medicine? This medicine is  for infusion into a vein. It is given by a health care professional in a hospital or clinic setting. A special MedGuide will be given to you before each treatment. Be sure to read this information carefully each time. Talk to your pediatrician regarding the use of this medicine in children. Special care may be needed. Overdosage: If you think you have taken too much of this medicine contact a poison control center or emergency room at once. NOTE: This medicine is only for you. Do not share this medicine with others. What if I miss a dose? It is important not to miss your dose. Call your doctor or health care professional if you are unable to keep an appointment. What may interact with this medicine? Interactions have not been studied. This list may not describe all possible interactions. Give your health care provider a list of all the medicines, herbs, non-prescription drugs, or dietary supplements you use. Also tell them if you smoke, drink alcohol, or use illegal drugs. Some items may interact with your medicine. What should I watch for while using this medicine? This drug may make you feel generally unwell. Continue your course of treatment even though you feel ill unless your doctor tells you to stop. You may need blood work done while you are taking this medicine. Do not become pregnant while taking this medicine or for 3 months after stopping it. Women should inform their doctor if they wish to become pregnant or think they might be pregnant. There is a potential for serious side effects to an unborn child. Talk to your health care professional or pharmacist for more information. Do not  breast-feed an infant while taking this medicine or for 3 months after stopping it. What side effects may I notice from receiving this medicine? Side effects that you should report to your doctor or health care professional as soon as possible:  allergic reactions like skin rash, itching or hives, swelling of  the face, lips, or tongue  black, tarry stools  bloody or watery diarrhea  breathing problems  change in emotions or moods  change in sex drive  changes in vision  chest pain or chest tightness  chills  confusion  cough  facial flushing  fever  headache  signs and symptoms of high blood sugar such as dizziness; dry mouth; dry skin; fruity breath; nausea; stomach pain; increased hunger or thirst; increased urination  signs and symptoms of liver injury like dark yellow or brown urine; general ill feeling or flu-like symptoms; light-colored stools; loss of appetite; nausea; right upper belly pain; unusually weak or tired; yellowing of the eyes or skin  stomach pain  trouble passing urine or change in the amount of urine  weight gain or weight loss Side effects that usually do not require medical attention (report these to your doctor or health care professional if they continue or are bothersome):  bone pain  constipation  loss of appetite  muscle pain  nausea  swelling of the ankles, feet, hands  tiredness This list may not describe all possible side effects. Call your doctor for medical advice about side effects. You may report side effects to FDA at 1-800-FDA-1088. Where should I keep my medicine? This drug is given in a hospital or clinic and will not be stored at home. NOTE: This sheet is a summary. It may not cover all possible information. If you have questions about this medicine, talk to your doctor, pharmacist, or health care provider.  2020 Elsevier/Gold Standard (2017-01-11 19:25:04)

## 2020-06-14 ENCOUNTER — Other Ambulatory Visit: Payer: Self-pay | Admitting: Osteopathic Medicine

## 2020-06-21 NOTE — Progress Notes (Signed)
  Radiation Oncology         [phone] ________________________________  Name: William Farrell MRN: [id number]  Date: 05/15/2020  DOB: Mar 26, 1961  End of Treatment Note  Diagnosis:  Lung cancer     Indication for treatment::  curative       Radiation treatment dates:   03/31/20 - 05/15/20  Site/dose:   The patient was treated to the disease within the right lung initially to a dose of 60 Gy using a 5 field, 3-D conformal technique. The patient then received a cone down boost treatment for an additional 6 Gy. This yielded a final total dose of 66 Gy.   Narrative: The patient tolerated radiation treatment relatively well.   The patient did experience esophagitis during the course of treatment.   Plan: The patient has completed radiation treatment. The patient will return to radiation oncology clinic for routine followup in one month. I advised the patient to call or return sooner if they have any questions or concerns related to their recovery or treatment. ________________________________  Jodelle Gross, M.D., Ph.D.

## 2020-06-23 ENCOUNTER — Telehealth: Payer: Self-pay | Admitting: *Deleted

## 2020-06-23 NOTE — Telephone Encounter (Signed)
Pt called with minor c/o feeling like pressure in his chest at times since having immunotherapy on 7/30. Pt is 1 month out since radiation treatment. Advised to take TUMS, which pt stated, "it's relieved after taking TUMS." Advised to continue that regimen when having symptoms. If it becomes frequent to call office. Also will cc radiology an FYI with pt concerns. Pt verbalized understanding.

## 2020-07-10 ENCOUNTER — Encounter: Payer: Self-pay | Admitting: Internal Medicine

## 2020-07-10 ENCOUNTER — Inpatient Hospital Stay: Payer: BC Managed Care – PPO | Admitting: Internal Medicine

## 2020-07-10 ENCOUNTER — Other Ambulatory Visit: Payer: Self-pay

## 2020-07-10 ENCOUNTER — Ambulatory Visit: Payer: BC Managed Care – PPO | Admitting: Physician Assistant

## 2020-07-10 ENCOUNTER — Inpatient Hospital Stay: Payer: BC Managed Care – PPO | Attending: Internal Medicine

## 2020-07-10 ENCOUNTER — Ambulatory Visit: Payer: BC Managed Care – PPO

## 2020-07-10 ENCOUNTER — Inpatient Hospital Stay: Payer: BC Managed Care – PPO

## 2020-07-10 ENCOUNTER — Other Ambulatory Visit: Payer: Self-pay | Admitting: Medical Oncology

## 2020-07-10 ENCOUNTER — Other Ambulatory Visit: Payer: BC Managed Care – PPO

## 2020-07-10 VITALS — BP 123/90 | HR 77 | Temp 97.9°F | Resp 18 | Ht 72.0 in | Wt 244.2 lb

## 2020-07-10 DIAGNOSIS — E7800 Pure hypercholesterolemia, unspecified: Secondary | ICD-10-CM | POA: Insufficient documentation

## 2020-07-10 DIAGNOSIS — K219 Gastro-esophageal reflux disease without esophagitis: Secondary | ICD-10-CM | POA: Diagnosis not present

## 2020-07-10 DIAGNOSIS — Z79899 Other long term (current) drug therapy: Secondary | ICD-10-CM | POA: Insufficient documentation

## 2020-07-10 DIAGNOSIS — Z5111 Encounter for antineoplastic chemotherapy: Secondary | ICD-10-CM

## 2020-07-10 DIAGNOSIS — Z791 Long term (current) use of non-steroidal anti-inflammatories (NSAID): Secondary | ICD-10-CM | POA: Insufficient documentation

## 2020-07-10 DIAGNOSIS — E119 Type 2 diabetes mellitus without complications: Secondary | ICD-10-CM | POA: Diagnosis not present

## 2020-07-10 DIAGNOSIS — Z5112 Encounter for antineoplastic immunotherapy: Secondary | ICD-10-CM | POA: Insufficient documentation

## 2020-07-10 DIAGNOSIS — C3411 Malignant neoplasm of upper lobe, right bronchus or lung: Secondary | ICD-10-CM | POA: Diagnosis not present

## 2020-07-10 DIAGNOSIS — R0609 Other forms of dyspnea: Secondary | ICD-10-CM | POA: Diagnosis not present

## 2020-07-10 DIAGNOSIS — Z7984 Long term (current) use of oral hypoglycemic drugs: Secondary | ICD-10-CM | POA: Insufficient documentation

## 2020-07-10 DIAGNOSIS — R918 Other nonspecific abnormal finding of lung field: Secondary | ICD-10-CM

## 2020-07-10 DIAGNOSIS — Z87442 Personal history of urinary calculi: Secondary | ICD-10-CM | POA: Diagnosis not present

## 2020-07-10 LAB — CBC WITH DIFFERENTIAL (CANCER CENTER ONLY)
Abs Immature Granulocytes: 0.03 10*3/uL (ref 0.00–0.07)
Basophils Absolute: 0.1 10*3/uL (ref 0.0–0.1)
Basophils Relative: 1 %
Eosinophils Absolute: 0.2 10*3/uL (ref 0.0–0.5)
Eosinophils Relative: 4 %
HCT: 35.7 % — ABNORMAL LOW (ref 39.0–52.0)
Hemoglobin: 12.4 g/dL — ABNORMAL LOW (ref 13.0–17.0)
Immature Granulocytes: 0 %
Lymphocytes Relative: 14 %
Lymphs Abs: 1 10*3/uL (ref 0.7–4.0)
MCH: 30.3 pg (ref 26.0–34.0)
MCHC: 34.7 g/dL (ref 30.0–36.0)
MCV: 87.3 fL (ref 80.0–100.0)
Monocytes Absolute: 0.8 10*3/uL (ref 0.1–1.0)
Monocytes Relative: 12 %
Neutro Abs: 4.7 10*3/uL (ref 1.7–7.7)
Neutrophils Relative %: 69 %
Platelet Count: 165 10*3/uL (ref 150–400)
RBC: 4.09 MIL/uL — ABNORMAL LOW (ref 4.22–5.81)
RDW: 15 % (ref 11.5–15.5)
WBC Count: 6.8 10*3/uL (ref 4.0–10.5)
nRBC: 0 % (ref 0.0–0.2)

## 2020-07-10 LAB — CMP (CANCER CENTER ONLY)
ALT: 20 U/L (ref 0–44)
AST: 16 U/L (ref 15–41)
Albumin: 3.9 g/dL (ref 3.5–5.0)
Alkaline Phosphatase: 109 U/L (ref 38–126)
Anion gap: 7 (ref 5–15)
BUN: 16 mg/dL (ref 6–20)
CO2: 22 mmol/L (ref 22–32)
Calcium: 10.2 mg/dL (ref 8.9–10.3)
Chloride: 107 mmol/L (ref 98–111)
Creatinine: 1.13 mg/dL (ref 0.61–1.24)
GFR, Est AFR Am: >60 mL/min (ref >60)
GFR, Estimated: >60 mL/min (ref >60)
Glucose, Bld: 200 mg/dL — ABNORMAL HIGH (ref 70–99)
Potassium: 4.3 mmol/L (ref 3.5–5.1)
Sodium: 136 mmol/L (ref 135–145)
Total Bilirubin: 0.5 mg/dL (ref 0.3–1.2)
Total Protein: 7.1 g/dL (ref 6.5–8.1)

## 2020-07-10 LAB — TSH: TSH: 1.962 u[IU]/mL (ref 0.320–4.118)

## 2020-07-10 MED ORDER — SODIUM CHLORIDE 0.9 % IV SOLN
Freq: Once | INTRAVENOUS | Status: AC
  Filled 2020-07-10: qty 250

## 2020-07-10 MED ORDER — SODIUM CHLORIDE 0.9 % IV SOLN
1500.0000 mg | Freq: Once | INTRAVENOUS | Status: AC
  Administered 2020-07-10: 1500 mg via INTRAVENOUS
  Filled 2020-07-10: qty 30

## 2020-07-10 MED ORDER — PROCHLORPERAZINE MALEATE 10 MG PO TABS: 10.0000 mg | ORAL_TABLET | Freq: Four times a day (QID) | ORAL | 0 refills | Status: DC | PRN

## 2020-07-10 NOTE — Progress Notes (Signed)
[place] [phone]   [phone]) [phone]  OFFICE PROGRESS NOTE  [name], DO [facility] [address] [place] [address]  DIAGNOSIS: Stage IIIA (T2a, N2, M0) non-small cell lung cancer, squamous cell carcinoma diagnosed in April 2021, presented with right upper lobe/suprahilar lung mass in addition to right paratracheal and subcarinal lymphadenopathy.  PRIOR THERAPY: Concurrent chemoradiation with weekly carboplatin for AUC of 2 and paclitaxel 45 NG/M2.  First dose on Mar 31, 2020.  Status post 7 cycles.  Last dose was given on 05/12/2020 with partial response.  CURRENT THERAPY: Consolidation immunotherapy with durvalumab 1500 mg IV every 4 weeks.  First dose May 12, 2020.  INTERVAL HISTORY: William Farrell 59 y.o. male returns to the clinic today for follow-up visit.  The patient is feeling fine today with no concerning complaints except for mild shortness of breath with exertion.  He did not around 4 pounds since his last visit.  He does not exercise at regular basis.  He denied having any chest pain, cough or hemoptysis.  He denied having any nausea, vomiting, diarrhea or constipation.  He has no headache or visual changes.  He tolerated the first cycle of his consolidation treatment with durvalumab fairly well.  The patient is here today for evaluation before starting cycle #2.  MEDICAL HISTORY: Past Medical History:  Diagnosis Date  . Asthma    as a child  . Diabetes ([place])   . Diverticulitis   . GERD (gastroesophageal reflux disease)   . High cholesterol   . History of kidney stones     ALLERGIES:  is allergic to sitagliptin.  MEDICATIONS:  Current Outpatient Medications  Medication Sig Dispense Refill  . acetaminophen (TYLENOL) 500 MG tablet Take 1,000 mg by mouth every 6 (six) hours as needed for moderate pain or headache.    . albuterol (VENTOLIN HFA) 108 (90 Base) MCG/ACT inhaler Inhale 2 puffs into the lungs every 6  (six) hours as needed for wheezing or shortness of breath.     Marland Kitchen aspirin EC 81 MG tablet Take 81 mg by mouth daily.    Marland Kitchen atorvastatin (LIPITOR) 40 MG tablet Take 1 tablet (40 mg total) by mouth daily. 90 tablet 3  . calcium carbonate (TUMS - DOSED IN MG ELEMENTAL CALCIUM) 500 MG chewable tablet Chew 2 tablets by mouth daily as needed for indigestion or heartburn.    Marland Kitchen glipiZIDE (GLUCOTROL) 5 MG tablet TAKE 1 TABLET DAILY BEFORE BREAKFAST 90 tablet 3  . glucose blood test strip Use as instructed up to qid 100 each 99  . meloxicam (MOBIC) 15 MG tablet Take 1 tablet (15 mg total) by mouth daily. 90 tablet 3  . metFORMIN (GLUCOPHAGE) 1000 MG tablet TAKE 1 TABLET TWICE A DAY WITH MEALS 180 tablet 3  . OneTouch Delica Lancets 46T MISC As directed up to qid 100 each 99  . penicillin v potassium (VEETID) 500 MG tablet Take 500 mg by mouth every 6 (six) hours.    . prochlorperazine (COMPAZINE) 10 MG tablet Take 1 tablet (10 mg total) by mouth every 6 (six) hours as needed for nausea or vomiting. 30 tablet 0  . sucralfate (CARAFATE) 1 g tablet Take 1 tablet (1 g total) by mouth 4 (four) times daily -  with meals and at bedtime. Crush 1 tablet in 1 oz water and drink 5 min before meals for radiation induced esophagitis 120 tablet 2  . trolamine salicylate (ASPERCREME) 10 % cream  Apply 1 application topically as needed for muscle pain.     No current facility-administered medications for this visit.    SURGICAL HISTORY:  Past Surgical History:  Procedure Laterality Date  . broken bone repair    . BRONCHIAL BIOPSY  03/11/2020   Procedure: BRONCHIAL BIOPSIES;  Surgeon: Garner Nash, DO;  Location: [facility];  Service: Pulmonary;;  . COLONOSCOPY    . CRYOTHERAPY  03/11/2020   Procedure: CRYOTHERAPY;  Surgeon: Garner Nash, DO;  Location: [facility];  Service: Pulmonary;;  . FINE NEEDLE ASPIRATION  03/11/2020   Procedure: FINE NEEDLE ASPIRATION (FNA) LINEAR;  Surgeon: Garner Nash, DO;   Location: [facility];  Service: Pulmonary;;  . KNEE ARTHROSCOPY WITH ANTERIOR CRUCIATE LIGAMENT (ACL) REPAIR     x 2  . VIDEO BRONCHOSCOPY WITH ENDOBRONCHIAL ULTRASOUND N/A 03/11/2020   Procedure: VIDEO BRONCHOSCOPY WITH ENDOBRONCHIAL ULTRASOUND;  Surgeon: Garner Nash, DO;  Location: [facility];  Service: Pulmonary;  Laterality: N/A;    REVIEW OF SYSTEMS:  A comprehensive review of systems was negative except for: Respiratory: positive for dyspnea on exertion   PHYSICAL EXAMINATION: General appearance: alert, cooperative and no distress Head: Normocephalic, without obvious abnormality, atraumatic Neck: no adenopathy, no JVD, supple, symmetrical, trachea midline and thyroid not enlarged, symmetric, no tenderness/mass/nodules Lymph nodes: Cervical, supraclavicular, and axillary nodes normal. Resp: clear to auscultation bilaterally Back: symmetric, no curvature. ROM normal. No CVA tenderness. Cardio: regular rate and rhythm, S1, S2 normal, no murmur, click, rub or gallop GI: soft, non-tender; bowel sounds normal; no masses,  no organomegaly Extremities: extremities normal, atraumatic, no cyanosis or edema  ECOG PERFORMANCE STATUS: 1 - Symptomatic but completely ambulatory  Blood pressure 123/90, pulse 77, temperature 97.9 F (36.6 C), temperature source Axillary, resp. rate 18, height 6' (1.829 m), weight 244 lb 3.2 oz (110.8 kg), SpO2 99 %.  LABORATORY DATA: Lab Results  Component Value Date   WBC 6.8 07/10/2020   HGB 12.4 (L) 07/10/2020   HCT 35.7 (L) 07/10/2020   MCV 87.3 07/10/2020   PLT 165 07/10/2020      Chemistry      Component Value Date/Time   NA 138 06/13/2020 0852   K 4.3 06/13/2020 0852   CL 109 06/13/2020 0852   CO2 22 06/13/2020 0852   BUN 17 06/13/2020 0852   CREATININE 1.25 (H) 06/13/2020 0852   CREATININE 1.10 01/14/2020 0859      Component Value Date/Time   CALCIUM 10.0 06/13/2020 0852   ALKPHOS 108 06/13/2020 0852   AST 16 06/13/2020 0852    ALT 23 06/13/2020 0852   BILITOT 0.5 06/13/2020 0852       RADIOGRAPHIC STUDIES: No results found.  ASSESSMENT AND PLAN: This is a 59 years old white male was recently diagnosed with stage IIIa non-small cell lung cancer, squamous cell carcinoma in April 2021 presented with right upper lobe/suprahilar lung mass in addition to right paratracheal and subcarinal lymphadenopathy. The patient completed a course of concurrent chemoradiation with weekly carboplatin for AUC of 2 and paclitaxel 45 MG/M2.  He is status post 7 cycles.  He tolerated the previous course of his treatment well except for mild dysphagia and odynophagia. He is currently undergoing consolidation treatment with immunotherapy with Imfinzi 1500 mg IV every 4 weeks.  Status post 1 cycle.  The patient tolerated the first cycle of his treatment fairly well.  I recommended for him to proceed with cycle #2 today as planned. I strongly encouraged the patient to  exercise at regular basis and this may improve his shortness of breath and also help him with weight loss. The patient will come back for follow-up visit in 4 weeks for evaluation before the next cycle of his treatment. He was advised to call immediately if he has any concerning symptoms in the interval. The patient voices understanding of current disease status and treatment options and is in agreement with the current care plan. All questions were answered. The patient knows to call the clinic with any problems, questions or concerns. We can certainly see the patient much sooner if necessary.  Disclaimer: This note was dictated with voice recognition software. Similar sounding words can inadvertently be transcribed and may not be corrected upon review.

## 2020-07-10 NOTE — Patient Instructions (Signed)
[facility] Discharge Instructions for Patients Receiving Chemotherapy  Today you received the following chemotherapy agents: Durvalumab.  To help prevent nausea and vomiting after your treatment, we encourage you to take your nausea medication as directed.   If you develop nausea and vomiting that is not controlled by your nausea medication, call the clinic.   BELOW ARE SYMPTOMS THAT SHOULD BE REPORTED IMMEDIATELY:  *FEVER GREATER THAN 100.5 F  *CHILLS WITH OR WITHOUT FEVER  NAUSEA AND VOMITING THAT IS NOT CONTROLLED WITH YOUR NAUSEA MEDICATION  *UNUSUAL SHORTNESS OF BREATH  *UNUSUAL BRUISING OR BLEEDING  TENDERNESS IN MOUTH AND THROAT WITH OR WITHOUT PRESENCE OF ULCERS  *URINARY PROBLEMS  *BOWEL PROBLEMS  UNUSUAL RASH Items with * indicate a potential emergency and should be followed up as soon as possible.  Feel free to call the clinic should you have any questions or concerns. The clinic phone number is [phone].  Please show the [facility] at check-in to the Emergency Department and triage nurse.

## 2020-08-03 ENCOUNTER — Other Ambulatory Visit: Payer: Self-pay | Admitting: Osteopathic Medicine

## 2020-08-07 ENCOUNTER — Inpatient Hospital Stay: Payer: BC Managed Care – PPO | Attending: Internal Medicine

## 2020-08-07 ENCOUNTER — Inpatient Hospital Stay: Payer: BC Managed Care – PPO

## 2020-08-07 ENCOUNTER — Other Ambulatory Visit: Payer: Self-pay

## 2020-08-07 ENCOUNTER — Inpatient Hospital Stay: Payer: BC Managed Care – PPO | Admitting: Internal Medicine

## 2020-08-07 ENCOUNTER — Ambulatory Visit: Payer: BC Managed Care – PPO | Admitting: Internal Medicine

## 2020-08-07 ENCOUNTER — Other Ambulatory Visit: Payer: Self-pay | Admitting: Physician Assistant

## 2020-08-07 ENCOUNTER — Other Ambulatory Visit: Payer: BC Managed Care – PPO

## 2020-08-07 ENCOUNTER — Encounter: Payer: Self-pay | Admitting: Internal Medicine

## 2020-08-07 ENCOUNTER — Ambulatory Visit: Payer: BC Managed Care – PPO

## 2020-08-07 VITALS — BP 130/85 | HR 98 | Temp 97.8°F | Resp 18 | Ht 72.0 in | Wt 250.9 lb

## 2020-08-07 DIAGNOSIS — Z5112 Encounter for antineoplastic immunotherapy: Secondary | ICD-10-CM | POA: Diagnosis not present

## 2020-08-07 DIAGNOSIS — C3490 Malignant neoplasm of unspecified part of unspecified bronchus or lung: Secondary | ICD-10-CM | POA: Diagnosis not present

## 2020-08-07 DIAGNOSIS — Z9221 Personal history of antineoplastic chemotherapy: Secondary | ICD-10-CM | POA: Insufficient documentation

## 2020-08-07 DIAGNOSIS — R1310 Dysphagia, unspecified: Secondary | ICD-10-CM | POA: Diagnosis not present

## 2020-08-07 DIAGNOSIS — J45909 Unspecified asthma, uncomplicated: Secondary | ICD-10-CM | POA: Insufficient documentation

## 2020-08-07 DIAGNOSIS — Z7982 Long term (current) use of aspirin: Secondary | ICD-10-CM | POA: Insufficient documentation

## 2020-08-07 DIAGNOSIS — Z791 Long term (current) use of non-steroidal anti-inflammatories (NSAID): Secondary | ICD-10-CM | POA: Diagnosis not present

## 2020-08-07 DIAGNOSIS — Z923 Personal history of irradiation: Secondary | ICD-10-CM | POA: Insufficient documentation

## 2020-08-07 DIAGNOSIS — C3411 Malignant neoplasm of upper lobe, right bronchus or lung: Secondary | ICD-10-CM

## 2020-08-07 DIAGNOSIS — R0609 Other forms of dyspnea: Secondary | ICD-10-CM | POA: Insufficient documentation

## 2020-08-07 DIAGNOSIS — Z79899 Other long term (current) drug therapy: Secondary | ICD-10-CM | POA: Diagnosis not present

## 2020-08-07 DIAGNOSIS — K219 Gastro-esophageal reflux disease without esophagitis: Secondary | ICD-10-CM | POA: Insufficient documentation

## 2020-08-07 DIAGNOSIS — E119 Type 2 diabetes mellitus without complications: Secondary | ICD-10-CM | POA: Diagnosis not present

## 2020-08-07 DIAGNOSIS — E7800 Pure hypercholesterolemia, unspecified: Secondary | ICD-10-CM | POA: Diagnosis not present

## 2020-08-07 DIAGNOSIS — R918 Other nonspecific abnormal finding of lung field: Secondary | ICD-10-CM

## 2020-08-07 DIAGNOSIS — Z794 Long term (current) use of insulin: Secondary | ICD-10-CM | POA: Insufficient documentation

## 2020-08-07 DIAGNOSIS — R739 Hyperglycemia, unspecified: Secondary | ICD-10-CM

## 2020-08-07 LAB — CMP (CANCER CENTER ONLY)
ALT: 19 U/L (ref 0–44)
AST: 13 U/L — ABNORMAL LOW (ref 15–41)
Albumin: 3.4 g/dL — ABNORMAL LOW (ref 3.5–5.0)
Alkaline Phosphatase: 120 U/L (ref 38–126)
Anion gap: 4 — ABNORMAL LOW (ref 5–15)
BUN: 18 mg/dL (ref 6–20)
CO2: 24 mmol/L (ref 22–32)
Calcium: 9.3 mg/dL (ref 8.9–10.3)
Chloride: 103 mmol/L (ref 98–111)
Creatinine: 1.21 mg/dL (ref 0.61–1.24)
GFR, Est AFR Am: >60 mL/min (ref >60)
GFR, Estimated: >60 mL/min (ref >60)
Glucose, Bld: 370 mg/dL — ABNORMAL HIGH (ref 70–99)
Potassium: 4.1 mmol/L (ref 3.5–5.1)
Sodium: 131 mmol/L — ABNORMAL LOW (ref 135–145)
Total Bilirubin: 0.4 mg/dL (ref 0.3–1.2)
Total Protein: 6.9 g/dL (ref 6.5–8.1)

## 2020-08-07 LAB — CBC WITH DIFFERENTIAL (CANCER CENTER ONLY)
Abs Immature Granulocytes: 0.04 10*3/uL (ref 0.00–0.07)
Basophils Absolute: 0.1 10*3/uL (ref 0.0–0.1)
Basophils Relative: 1 %
Eosinophils Absolute: 0.2 10*3/uL (ref 0.0–0.5)
Eosinophils Relative: 3 %
HCT: 33.1 % — ABNORMAL LOW (ref 39.0–52.0)
Hemoglobin: 11.4 g/dL — ABNORMAL LOW (ref 13.0–17.0)
Immature Granulocytes: 1 %
Lymphocytes Relative: 11 %
Lymphs Abs: 0.7 10*3/uL (ref 0.7–4.0)
MCH: 29.7 pg (ref 26.0–34.0)
MCHC: 34.4 g/dL (ref 30.0–36.0)
MCV: 86.2 fL (ref 80.0–100.0)
Monocytes Absolute: 0.8 10*3/uL (ref 0.1–1.0)
Monocytes Relative: 12 %
Neutro Abs: 4.6 10*3/uL (ref 1.7–7.7)
Neutrophils Relative %: 72 %
Platelet Count: 183 10*3/uL (ref 150–400)
RBC: 3.84 MIL/uL — ABNORMAL LOW (ref 4.22–5.81)
RDW: 12.6 % (ref 11.5–15.5)
WBC Count: 6.4 10*3/uL (ref 4.0–10.5)
nRBC: 0 % (ref 0.0–0.2)

## 2020-08-07 LAB — TSH: TSH: 0.392 u[IU]/mL (ref 0.320–4.118)

## 2020-08-07 MED ORDER — INSULIN REGULAR HUMAN 100 UNIT/ML IJ SOLN
INTRAMUSCULAR | Status: AC
  Filled 2020-08-07: qty 1

## 2020-08-07 MED ORDER — SODIUM CHLORIDE 0.9 % IV SOLN
Freq: Once | INTRAVENOUS | Status: AC
  Filled 2020-08-07: qty 250

## 2020-08-07 MED ORDER — SODIUM CHLORIDE 0.9 % IV SOLN
1500.0000 mg | Freq: Once | INTRAVENOUS | Status: AC
  Administered 2020-08-07: 1500 mg via INTRAVENOUS
  Filled 2020-08-07: qty 30

## 2020-08-07 MED ORDER — INSULIN REGULAR HUMAN 100 UNIT/ML IJ SOLN
8.0000 [IU] | Freq: Once | INTRAMUSCULAR | Status: AC
  Administered 2020-08-07: 8 [IU] via SUBCUTANEOUS

## 2020-08-07 NOTE — Patient Instructions (Signed)
[place] Discharge Instructions for Patients Receiving Chemotherapy  Today you received the following chemotherapy agents: Durvalumab.  To help prevent nausea and vomiting after your treatment, we encourage you to take your nausea medication as directed.   If you develop nausea and vomiting that is not controlled by your nausea medication, call the clinic.   BELOW ARE SYMPTOMS THAT SHOULD BE REPORTED IMMEDIATELY:  *FEVER GREATER THAN 100.5 F  *CHILLS WITH OR WITHOUT FEVER  NAUSEA AND VOMITING THAT IS NOT CONTROLLED WITH YOUR NAUSEA MEDICATION  *UNUSUAL SHORTNESS OF BREATH  *UNUSUAL BRUISING OR BLEEDING  TENDERNESS IN MOUTH AND THROAT WITH OR WITHOUT PRESENCE OF ULCERS  *URINARY PROBLEMS  *BOWEL PROBLEMS  UNUSUAL RASH Items with * indicate a potential emergency and should be followed up as soon as possible.  Feel free to call the clinic should you have any questions or concerns. The clinic phone number is [phone].  Please show the [facility] at check-in to the Emergency Department and triage nurse.

## 2020-08-07 NOTE — Progress Notes (Signed)
[place] [phone]   Fax:([phone]  OFFICE PROGRESS NOTE  [name], DO [address] [place] [address]  DIAGNOSIS: Stage IIIA (T2a, N2, M0) non-small cell lung cancer, squamous cell carcinoma diagnosed in April 2021, presented with right upper lobe/suprahilar lung mass in addition to right paratracheal and subcarinal lymphadenopathy.  PRIOR THERAPY: Concurrent chemoradiation with weekly carboplatin for AUC of 2 and paclitaxel 45 NG/M2.  First dose on Mar 31, 2020.  Status post 7 cycles.  Last dose was given on 05/12/2020 with partial response.  CURRENT THERAPY: Consolidation immunotherapy with durvalumab 1500 mg IV every 4 weeks.  First dose May 12, 2020.  Status post 2 cycles.  INTERVAL HISTORY: William Farrell 59 y.o. male returns to the clinic today for follow-up visit accompanied by his wife.  The patient is feeling fine today with no concerning complaints except for gaining weight of around 6 pounds.  He denied having any chest pain but has shortness of breath with exertion with mild cough and no hemoptysis.  He denied having any fever or chills.  He has no nausea, vomiting, diarrhea or constipation.  He has no headache or visual changes.  He continues to tolerate his treatment with immunotherapy fairly well.  The patient is here today for evaluation before starting cycle #3.   MEDICAL HISTORY: Past Medical History:  Diagnosis Date  . Asthma    as a child  . Diabetes ([place])   . Diverticulitis   . GERD (gastroesophageal reflux disease)   . High cholesterol   . History of kidney stones     ALLERGIES:  is allergic to sitagliptin.  MEDICATIONS:  Current Outpatient Medications  Medication Sig Dispense Refill  . acetaminophen (TYLENOL) 500 MG tablet Take 1,000 mg by mouth every 6 (six) hours as needed for moderate pain or headache.    . albuterol (VENTOLIN HFA) 108 (90 Base) MCG/ACT inhaler Inhale 2 puffs into  the lungs every 6 (six) hours as needed for wheezing or shortness of breath.     Marland Kitchen aspirin EC 81 MG tablet Take 81 mg by mouth daily.    Marland Kitchen atorvastatin (LIPITOR) 40 MG tablet Take 1 tablet (40 mg total) by mouth daily. 90 tablet 3  . calcium carbonate (TUMS - DOSED IN MG ELEMENTAL CALCIUM) 500 MG chewable tablet Chew 2 tablets by mouth daily as needed for indigestion or heartburn.    Marland Kitchen glipiZIDE (GLUCOTROL) 5 MG tablet TAKE 1 TABLET DAILY BEFORE BREAKFAST 90 tablet 3  . glucose blood test strip Use as instructed up to qid 100 each 99  . meloxicam (MOBIC) 15 MG tablet TAKE 1 TABLET DAILY 90 tablet 3  . metFORMIN (GLUCOPHAGE) 1000 MG tablet TAKE 1 TABLET TWICE A DAY WITH MEALS 180 tablet 3  . OneTouch Delica Lancets 73Z MISC As directed up to qid 100 each 99  . prochlorperazine (COMPAZINE) 10 MG tablet Take 1 tablet (10 mg total) by mouth every 6 (six) hours as needed for nausea or vomiting. 30 tablet 0  . sucralfate (CARAFATE) 1 g tablet Take 1 tablet (1 g total) by mouth 4 (four) times daily -  with meals and at bedtime. Crush 1 tablet in 1 oz water and drink 5 min before meals for radiation induced esophagitis 120 tablet 2  . trolamine salicylate (ASPERCREME) 10 % cream Apply 1 application topically as needed for muscle pain.     No current facility-administered medications for this visit.  SURGICAL HISTORY:  Past Surgical History:  Procedure Laterality Date  . broken bone repair    . BRONCHIAL BIOPSY  03/11/2020   Procedure: BRONCHIAL BIOPSIES;  Surgeon: Garner Nash, DO;  Location: [facility];  Service: Pulmonary;;  . COLONOSCOPY    . CRYOTHERAPY  03/11/2020   Procedure: CRYOTHERAPY;  Surgeon: Garner Nash, DO;  Location: [facility];  Service: Pulmonary;;  . FINE NEEDLE ASPIRATION  03/11/2020   Procedure: FINE NEEDLE ASPIRATION (FNA) LINEAR;  Surgeon: Garner Nash, DO;  Location: [facility];  Service: Pulmonary;;  . KNEE ARTHROSCOPY WITH ANTERIOR CRUCIATE LIGAMENT (ACL)  REPAIR     x 2  . VIDEO BRONCHOSCOPY WITH ENDOBRONCHIAL ULTRASOUND N/A 03/11/2020   Procedure: VIDEO BRONCHOSCOPY WITH ENDOBRONCHIAL ULTRASOUND;  Surgeon: Garner Nash, DO;  Location: [facility];  Service: Pulmonary;  Laterality: N/A;    REVIEW OF SYSTEMS:  A comprehensive review of systems was negative except for: Respiratory: positive for dyspnea on exertion Gastrointestinal: positive for dysphagia   PHYSICAL EXAMINATION: General appearance: alert, cooperative and no distress Head: Normocephalic, without obvious abnormality, atraumatic Neck: no adenopathy, no JVD, supple, symmetrical, trachea midline and thyroid not enlarged, symmetric, no tenderness/mass/nodules Lymph nodes: Cervical, supraclavicular, and axillary nodes normal. Resp: clear to auscultation bilaterally Back: symmetric, no curvature. ROM normal. No CVA tenderness. Cardio: regular rate and rhythm, S1, S2 normal, no murmur, click, rub or gallop GI: soft, non-tender; bowel sounds normal; no masses,  no organomegaly Extremities: extremities normal, atraumatic, no cyanosis or edema  ECOG PERFORMANCE STATUS: 1 - Symptomatic but completely ambulatory  Blood pressure 130/85, pulse 98, temperature 97.8 F (36.6 C), temperature source Tympanic, resp. rate 18, height 6' (1.829 m), weight 250 lb 14.4 oz (113.8 kg), SpO2 98 %.  LABORATORY DATA: Lab Results  Component Value Date   WBC 6.4 08/07/2020   HGB 11.4 (L) 08/07/2020   HCT 33.1 (L) 08/07/2020   MCV 86.2 08/07/2020   PLT 183 08/07/2020      Chemistry      Component Value Date/Time   NA 136 07/10/2020 0756   K 4.3 07/10/2020 0756   CL 107 07/10/2020 0756   CO2 22 07/10/2020 0756   BUN 16 07/10/2020 0756   CREATININE 1.13 07/10/2020 0756   CREATININE 1.10 01/14/2020 0859      Component Value Date/Time   CALCIUM 10.2 07/10/2020 0756   ALKPHOS 109 07/10/2020 0756   AST 16 07/10/2020 0756   ALT 20 07/10/2020 0756   BILITOT 0.5 07/10/2020 0756        RADIOGRAPHIC STUDIES: No results found.  ASSESSMENT AND PLAN: This is a 59 years old white male was recently diagnosed with stage IIIa non-small cell lung cancer, squamous cell carcinoma in April 2021 presented with right upper lobe/suprahilar lung mass in addition to right paratracheal and subcarinal lymphadenopathy. The patient completed a course of concurrent chemoradiation with weekly carboplatin for AUC of 2 and paclitaxel 45 MG/M2.  He is status post 7 cycles.  He tolerated the previous course of his treatment well except for mild dysphagia and odynophagia. He is currently undergoing consolidation treatment with immunotherapy with Imfinzi 1500 mg IV every 4 weeks.  Status post 2 cycles.   The patient continues to tolerate this treatment well with no concerning adverse effects. I recommended for him to proceed with cycle #3 today as planned. I will see him back for follow-up visit in 4 weeks for evaluation before starting cycle #4 with repeat CT scan of the chest  for restaging of his disease. The patient was advised to call immediately if he has any concerning symptoms in the interval.  The patient voices understanding of current disease status and treatment options and is in agreement with the current care plan. All questions were answered. The patient knows to call the clinic with any problems, questions or concerns. We can certainly see the patient much sooner if necessary.  Disclaimer: This note was dictated with voice recognition software. Similar sounding words can inadvertently be transcribed and may not be corrected upon review.

## 2020-08-07 NOTE — Progress Notes (Signed)
Pt ate w/in 30 min of insulin injection.  Pt aware to f/u with PCP regarding blood sugar management per PA Cassie.

## 2020-08-08 ENCOUNTER — Telehealth: Payer: Self-pay | Admitting: Internal Medicine

## 2020-08-08 NOTE — Telephone Encounter (Signed)
Scheduled per los. Called and spoke with patient. Confirmed appts  

## 2020-08-13 ENCOUNTER — Encounter: Payer: Self-pay | Admitting: Family Medicine

## 2020-08-13 ENCOUNTER — Ambulatory Visit (INDEPENDENT_AMBULATORY_CARE_PROVIDER_SITE_OTHER): Payer: BC Managed Care – PPO | Admitting: Family Medicine

## 2020-08-13 DIAGNOSIS — E119 Type 2 diabetes mellitus without complications: Secondary | ICD-10-CM

## 2020-08-13 MED ORDER — GLIPIZIDE 5 MG PO TABS
5.0000 mg | ORAL_TABLET | Freq: Two times a day (BID) | ORAL | 3 refills | Status: DC
Start: 2020-08-13 — End: 2021-07-28

## 2020-08-13 NOTE — Progress Notes (Signed)
William Farrell - 59 y.o. male MRN [id number]  Date of birth: 03-07-1961  Subjective Chief Complaint  Patient presents with  . Hyperglycemia    HPI William Farrell is a 59 y.o. male with history of T2DM and Lung cancer.  He is here today to discuss elevated blood sugars.  He is currently undergoing treatment for lung cancer.  He completed course of chemotherapy and was receiving steroids prior to infusions.  This seemed to elevated his blood sugar but once this was completed blood sugars improved.  He is not on immunotherapy medication with Imfinzi.  He is also taking compazine for nausea.  He has been taking compazine daily but doesn't fee like he really needs this because he hasn't had much nausea.  Blood sugars at last infusion were in the 300's.  He was given insulin prior to infusion.  Blood sugar readings at home in the low 200's.  He also reports that since he quit smoking he had gained weight. He admits that he needs to make some improvements to his diet.  He would like to avoid insulin if possible.  He has had some fatigue but unsure if this is related to blood sugars.   ROS:  A comprehensive ROS was completed and negative except as noted per HPI    Allergies  Allergen Reactions  . Sitagliptin Other (See Comments)    headache    Past Medical History:  Diagnosis Date  . Asthma    as a child  . Diabetes ([place])   . Diverticulitis   . GERD (gastroesophageal reflux disease)   . High cholesterol   . History of kidney stones     Past Surgical History:  Procedure Laterality Date  . broken bone repair    . BRONCHIAL BIOPSY  03/11/2020   Procedure: BRONCHIAL BIOPSIES;  Surgeon: Garner Nash, DO;  Location: [facility];  Service: Pulmonary;;  . COLONOSCOPY    . CRYOTHERAPY  03/11/2020   Procedure: CRYOTHERAPY;  Surgeon: Garner Nash, DO;  Location: [facility];  Service: Pulmonary;;  . FINE NEEDLE ASPIRATION  03/11/2020   Procedure: FINE NEEDLE ASPIRATION (FNA)  LINEAR;  Surgeon: Garner Nash, DO;  Location: [facility];  Service: Pulmonary;;  . KNEE ARTHROSCOPY WITH ANTERIOR CRUCIATE LIGAMENT (ACL) REPAIR     x 2  . VIDEO BRONCHOSCOPY WITH ENDOBRONCHIAL ULTRASOUND N/A 03/11/2020   Procedure: VIDEO BRONCHOSCOPY WITH ENDOBRONCHIAL ULTRASOUND;  Surgeon: Garner Nash, DO;  Location: [facility];  Service: Pulmonary;  Laterality: N/A;    Social History   Socioeconomic History  . Marital status: Married    Spouse name: Not on file  . Number of children: Not on file  . Years of education: Not on file  . Highest education level: Not on file  Occupational History  . Occupation: Research officer, trade union: [employer]   Tobacco Use  . Smoking status: Former Smoker    Packs/day: 2.00    Years: 43.00    Pack years: 86.00    Types: Cigarettes    Quit date: 04/15/2020    Years since quitting: 0.3  . Smokeless tobacco: Never Used  . Tobacco comment: currently smoking 15cigs daily as of 03/06/20  Vaping Use  . Vaping Use: Never used  Substance and Sexual Activity  . Alcohol use: Not Currently  . Drug use: Not Currently  . Sexual activity: Yes    Partners: Female    Birth control/protection: None  Other Topics Concern  . Not  on file  Social History Narrative  . Not on file   Social Determinants of Health   Financial Resource Strain:   . Difficulty of Paying Living Expenses: Not on file  Food Insecurity:   . Worried About Charity fundraiser in the Last Year: Not on file  . Ran Out of Food in the Last Year: Not on file  Transportation Needs:   . Lack of Transportation (Medical): Not on file  . Lack of Transportation (Non-Medical): Not on file  Physical Activity:   . Days of Exercise per Week: Not on file  . Minutes of Exercise per Session: Not on file  Stress:   . Feeling of Stress : Not on file  Social Connections:   . Frequency of Communication with Friends and Family: Not on file  . Frequency of Social Gatherings with  Friends and Family: Not on file  . Attends Religious Services: Not on file  . Active Member of Clubs or Organizations: Not on file  . Attends Archivist Meetings: Not on file  . Marital Status: Not on file    Family History  Problem Relation Age of Onset  . High blood pressure Mother   . Stroke Mother     Health Maintenance  Topic Date Due  . Hepatitis C Screening  Never done  . FOOT EXAM  Never done  . OPHTHALMOLOGY EXAM  Never done  . URINE MICROALBUMIN  Never done  . HIV Screening  Never done  . COLONOSCOPY  Never done  . HEMOGLOBIN A1C  07/16/2020  . TETANUS/TDAP  09/19/2028  . INFLUENZA VACCINE  Completed  . PNEUMOCOCCAL POLYSACCHARIDE VACCINE AGE 28-64 HIGH RISK  Completed  . COVID-19 Vaccine  Completed     ----------------------------------------------------------------------------------------------------------------------------------------------------------------------------------------------------------------- Physical Exam BP 113/68 (BP Location: Left Arm, Patient Position: Sitting, Cuff Size: Large)   Pulse 99   Temp 98.2 F (36.8 C) (Oral)   Ht 6' 0.05" (1.83 m)   Wt 246 lb 6.4 oz (111.8 kg)   SpO2 97%   BMI 33.37 kg/m   Physical Exam Constitutional:      Appearance: Normal appearance.  HENT:     Head: Normocephalic and atraumatic.  Neurological:     General: No focal deficit present.     Mental Status: He is alert.  Psychiatric:        Mood and Affect: Mood normal.        Behavior: Behavior normal.     ------------------------------------------------------------------------------------------------------------------------------------------------------------------------------------------------------------------- Assessment and Plan  Type 2 diabetes mellitus without complication, without long-term current use of insulin ([place]) I think the recent increases in his blood sugar are multifactorial.  He has gained weight and we talked about  making dietary and activity changes to improve this.  Imfinzi side effect profile indicates that this can cause hyperglycemia in about 1/2 of individuals taking this.  Compazine also has this potential.  He will hold compazine for now since nausea is controlled.  Will increase glipizide to 5mg  BID, update rx sent in.  May need to add additional medication or discuss insulin if blood sugars remain elevated.  He will follow up in about 4 weeks.    Meds ordered this encounter  Medications  . glipiZIDE (GLUCOTROL) 5 MG tablet    Sig: Take 1 tablet (5 mg total) by mouth 2 (two) times daily before a meal.    Dispense:  180 tablet    Refill:  3    Return in about 4 weeks (around 09/10/2020) for  Hyperglycemia.    This visit occurred during the SARS-CoV-2 public health emergency.  Safety protocols were in place, including screening questions prior to the visit, additional usage of staff PPE, and extensive cleaning of exam room while observing appropriate contact time as indicated for disinfecting solutions.

## 2020-08-13 NOTE — Patient Instructions (Signed)
Nice to meet you! Let's try changing the glipizide to 5mg  twice per day You can try holding the compazine and see if this makes a difference as well.  Let's plan to have you follow up in about 3-4 weeks.

## 2020-08-13 NOTE — Assessment & Plan Note (Signed)
I think the recent increases in his blood sugar are multifactorial.  He has gained weight and we talked about making dietary and activity changes to improve this.  Imfinzi side effect profile indicates that this can cause hyperglycemia in about 1/2 of individuals taking this.  Compazine also has this potential.  He will hold compazine for now since nausea is controlled.  Will increase glipizide to 5mg  BID, update rx sent in.  May need to add additional medication or discuss insulin if blood sugars remain elevated.  He will follow up in about 4 weeks.

## 2020-08-27 ENCOUNTER — Telehealth: Payer: Self-pay | Admitting: Medical Oncology

## 2020-08-27 NOTE — Telephone Encounter (Signed)
Pt called PCP and is starting on Prednisone and getting tested for COVID . N. Rosa will get auth number.

## 2020-08-27 NOTE — Telephone Encounter (Signed)
New symptoms-  runny nose, scratchy throat and now he says he has a sore throat x 3 days.   Pt interventions.-gargled with salt water, Listerine and taking tylenol every 8 hours prn.  He reports the symptoms are a little better today.  Please advise.

## 2020-08-28 NOTE — Telephone Encounter (Signed)
Pt LM indicating his COVID test was negative and he is starting to feel better. This was an [place].

## 2020-08-29 ENCOUNTER — Other Ambulatory Visit: Payer: Self-pay | Admitting: Internal Medicine

## 2020-08-29 ENCOUNTER — Other Ambulatory Visit: Payer: Self-pay | Admitting: Osteopathic Medicine

## 2020-08-29 DIAGNOSIS — Z5111 Encounter for antineoplastic chemotherapy: Secondary | ICD-10-CM

## 2020-09-01 NOTE — Progress Notes (Signed)
[facility] OFFICE PROGRESS NOTE  [name], DO [address] [place] [address]  DIAGNOSIS: Stage IIIA(T2a, N2, M0) non-small cell lung cancer, squamous cell carcinoma diagnosed in April 2021, presented with right upper lobe/suprahilar lung mass in addition to right paratracheal and subcarinal lymphadenopathy.  PRIOR THERAPY: Concurrent chemoradiation with weekly carboplatin for AUC of 2 and paclitaxel 45 NG/M2.  First dose on Mar 31, 2020.  Status post 7 cycles.  Last dose was given on 05/12/2020 with partial response.  CURRENT THERAPY: Consolidation immunotherapy with durvalumab 1500 mg IV every 4 weeks.  First dose May 12, 2020. Status post 3 cycles.   INTERVAL HISTORY: William Farrell 59 y.o. male returns to the clinic for a follow up visit accompanied by his wife. The patient is feeling fairly well today without any concerning complaints except for a lingering cough. He and his wife had caught a cold from their [place]. They were tested for covid and were negative. He had a scratty throat which has since improved and he had some chest soreness which has improved as well. The patient's cough is productive and produces yellow sputum. He had used tylenol, salt water gargles, and halls. He denies associated fevers. He states he has chills due to being cold natured which is not unusual for him. He had some associated shortness of breath but denies hemoptysis. The patient continues to tolerate treatment with immunotherapy with Imfinzi well without any adverse side effects. Denies any weight loss. Denies any vomiting, diarrhea, or constipation but had some nausea associated with phlegm being stuck in his throat. Denies any headache or visual changes. Denies any rashes or skin changes. The patient recently had a restaging CT scan performed. The patient is here today for evaluation and to review his scan results prior to starting cycle # 4   MEDICAL  HISTORY: Past Medical History:  Diagnosis Date  . Asthma    as a child  . Diabetes ([place])   . Diverticulitis   . GERD (gastroesophageal reflux disease)   . High cholesterol   . History of kidney stones     ALLERGIES:  is allergic to sitagliptin.  MEDICATIONS:  Current Outpatient Medications  Medication Sig Dispense Refill  . acetaminophen (TYLENOL) 500 MG tablet Take 1,000 mg by mouth every 6 (six) hours as needed for moderate pain or headache.    . albuterol (VENTOLIN HFA) 108 (90 Base) MCG/ACT inhaler Inhale 2 puffs into the lungs every 6 (six) hours as needed for wheezing or shortness of breath.     Marland Kitchen aspirin EC 81 MG tablet Take 81 mg by mouth daily.    Marland Kitchen atorvastatin (LIPITOR) 40 MG tablet TAKE 1 TABLET DAILY 90 tablet 1  . calcium carbonate (TUMS - DOSED IN MG ELEMENTAL CALCIUM) 500 MG chewable tablet Chew 2 tablets by mouth daily as needed for indigestion or heartburn.    Marland Kitchen glipiZIDE (GLUCOTROL) 5 MG tablet Take 1 tablet (5 mg total) by mouth 2 (two) times daily before a meal. 180 tablet 3  . glucose blood test strip Use as instructed up to qid 100 each 99  . meloxicam (MOBIC) 15 MG tablet TAKE 1 TABLET DAILY 90 tablet 3  . metFORMIN (GLUCOPHAGE) 1000 MG tablet TAKE 1 TABLET TWICE A DAY WITH MEALS 180 tablet 3  . OneTouch Delica Lancets 61W MISC As directed up to qid 100 each 99  . prochlorperazine (COMPAZINE) 10 MG tablet TAKE 1 TABLET (10 MG TOTAL) BY MOUTH  EVERY 6 (SIX) HOURS AS NEEDED FOR NAUSEA OR VOMITING. 30 tablet 0  . sucralfate (CARAFATE) 1 g tablet Take 1 tablet (1 g total) by mouth 4 (four) times daily -  with meals and at bedtime. Crush 1 tablet in 1 oz water and drink 5 min before meals for radiation induced esophagitis 120 tablet 2  . trolamine salicylate (ASPERCREME) 10 % cream Apply 1 application topically as needed for muscle pain.    Marland Kitchen doxycycline (VIBRA-TABS) 100 MG tablet Take 1 tablet (100 mg total) by mouth 2 (two) times daily. 14 tablet 0   No current  facility-administered medications for this visit.    SURGICAL HISTORY:  Past Surgical History:  Procedure Laterality Date  . broken bone repair    . BRONCHIAL BIOPSY  03/11/2020   Procedure: BRONCHIAL BIOPSIES;  Surgeon: Garner Nash, DO;  Location: [facility];  Service: Pulmonary;;  . COLONOSCOPY    . CRYOTHERAPY  03/11/2020   Procedure: CRYOTHERAPY;  Surgeon: Garner Nash, DO;  Location: [facility];  Service: Pulmonary;;  . FINE NEEDLE ASPIRATION  03/11/2020   Procedure: FINE NEEDLE ASPIRATION (FNA) LINEAR;  Surgeon: Garner Nash, DO;  Location: [facility];  Service: Pulmonary;;  . KNEE ARTHROSCOPY WITH ANTERIOR CRUCIATE LIGAMENT (ACL) REPAIR     x 2  . VIDEO BRONCHOSCOPY WITH ENDOBRONCHIAL ULTRASOUND N/A 03/11/2020   Procedure: VIDEO BRONCHOSCOPY WITH ENDOBRONCHIAL ULTRASOUND;  Surgeon: Garner Nash, DO;  Location: [facility];  Service: Pulmonary;  Laterality: N/A;    REVIEW OF SYSTEMS:   Review of Systems  Constitutional: Negative for appetite change, chills, fatigue, fever and unexpected weight change.  HENT: Positive for sore throat (improved ). Negative for mouth sores, nosebleeds, and trouble swallowing.   Eyes: Negative for eye problems and icterus.  Respiratory: Positive for productive cough and shortness of breath.  Negative for hemoptysis and wheezing.   Cardiovascular: Negative for chest pain and leg swelling.  Gastrointestinal: Negative for abdominal pain, constipation, diarrhea, nausea and vomiting.  Genitourinary: Negative for bladder incontinence, difficulty urinating, dysuria, frequency and hematuria.   Musculoskeletal: Negative for back pain, gait problem, neck pain and neck stiffness.  Skin: Negative for itching and rash.  Neurological: Negative for dizziness, extremity weakness, gait problem, headaches, light-headedness and seizures.  Hematological: Negative for adenopathy. Does not bruise/bleed easily.  Psychiatric/Behavioral: Negative for  confusion, depression and sleep disturbance. The patient is not nervous/anxious.     PHYSICAL EXAMINATION:  Blood pressure 120/77, pulse 83, temperature (!) 97 F (36.1 C), temperature source Tympanic, resp. rate 16, height 6' 0.05" (1.83 m), weight 248 lb 12.8 oz (112.9 kg), SpO2 98 %.  ECOG PERFORMANCE STATUS: 1 - Symptomatic but completely ambulatory  Physical Exam  Constitutional: Oriented to person, place, and time and well-developed, well-nourished, and in no distress.  HENT:  Head: Normocephalic and atraumatic.  Mouth/Throat: Oropharynx is clear and moist. No oropharyngeal exudate.  Eyes: Conjunctivae are normal. Right eye exhibits no discharge. Left eye exhibits no discharge. No scleral icterus.  Neck: Normal range of motion. Neck supple.  Cardiovascular: Normal rate, regular rhythm, normal heart sounds and intact distal pulses.   Pulmonary/Chest: Effort normal and breath sounds normal. No respiratory distress. No wheezes. No rales.  Abdominal: Soft. Bowel sounds are normal. Exhibits no distension and no mass. There is no tenderness.  Musculoskeletal: Normal range of motion. Exhibits no edema.  Lymphadenopathy:    No cervical adenopathy.  Neurological: Alert and oriented to person, place, and time. Exhibits normal muscle  tone. Gait normal. Coordination normal.  Skin: Skin is warm and dry. No rash noted. Not diaphoretic. No erythema. No pallor.  Psychiatric: Mood, memory and judgment normal.  Vitals reviewed.  LABORATORY DATA: Lab Results  Component Value Date   WBC 7.2 09/04/2020   HGB 11.2 (L) 09/04/2020   HCT 33.4 (L) 09/04/2020   MCV 85.2 09/04/2020   PLT 209 09/04/2020      Chemistry      Component Value Date/Time   NA 139 09/04/2020 0746   K 4.3 09/04/2020 0746   CL 107 09/04/2020 0746   CO2 22 09/04/2020 0746   BUN 15 09/04/2020 0746   CREATININE 1.25 (H) 09/04/2020 0746   CREATININE 1.10 01/14/2020 0859      Component Value Date/Time   CALCIUM 9.4  09/04/2020 0746   ALKPHOS 115 09/04/2020 0746   AST 18 09/04/2020 0746   ALT 26 09/04/2020 0746   BILITOT 0.2 (L) 09/04/2020 0746       RADIOGRAPHIC STUDIES:  CT Chest W Contrast  Result Date: 09/02/2020 CLINICAL DATA:  Follow-up right non-small cell lung carcinoma. EXAM: CT CHEST WITH CONTRAST TECHNIQUE: Multidetector CT imaging of the chest was performed during intravenous contrast administration. CONTRAST:  38mL OMNIPAQUE IOHEXOL 300 MG/ML  SOLN COMPARISON:  06/02/2020 FINDINGS: Cardiovascular:  No acute findings. Mediastinum/Nodes: Small less than 1 cm right paratracheal, subcarinal, and right hilar lymph nodes show decreased in size since previous study. No new or increased sites of lymphadenopathy identified. Lungs/Pleura: New solid lobulated nodule is seen in the central posterior right upper lobe which measures 2.5 x 2.0 cm, suspicious for recurrent carcinoma. Two adjacent solid nodules are seen in the right lung apex, largest measuring 1.3 cm. These are also suspicious for metastatic disease. New ill-defined areas of ground-glass and airspace opacity are seen in the anterior right upper lobe and superior right lower lobe, which may be due to post treatment changes although differential diagnosis also includes drug reaction and infection. No evidence of pleural effusion. Upper Abdomen:  Unremarkable. Musculoskeletal:  No suspicious bone lesions. IMPRESSION: New 2.5 cm solid lobulated nodule in central posterior right upper lobe, suspicious for recurrent carcinoma. Two new adjacent solid pulmonary nodules in right lung apex, largest measuring 1.3 cm, also suspicious for metastatic disease. New ill-defined areas of ground-glass and airspace opacity in anterior right upper lobe and superior right lower lobe, which may be due to post treatment changes, drug reaction, or infection. Interval decrease in size of sub-cm right paratracheal, subcarinal, and right hilar lymph nodes. Electronically Signed    By: Marlaine Hind M.D.   On: 09/02/2020 14:04     ASSESSMENT/PLAN:  This is a 59 year old Caucasian male diagnosed with stage IIIa non-small cell lung cancer, squamous cell carcinoma in April 2021 presented with right upper lobe/suprahilar lung mass in addition to right paratracheal and subcarinal lymphadenopathy.   The patient completed a course of concurrent chemoradiation with weekly carboplatin for AUC of 2 and paclitaxel 45 MG/M2.  He is status post 7 cycles.  He tolerated the previous course of his treatment well except for mild dysphagia and odynophagia.  He is currently undergoing consolidation treatment with immunotherapy with Imfinzi 1500 mg IV every 4 weeks.  Status post 3 cycles.    The patient recently had a restaging CT scan performed. Dr. Julien Nordmann personally and independently reviewed the scan and discussed the results with the patient. The scan showed new 2.5 cm solid lobulated nodule in central posterior right upper lobe,  suspicious for recurrent carcinoma. There was also two new adjacent solid pulmonary nodules in right lung apex, largest measuring 1.3 cm, also suspicious for metastatic disease. However, given the patient's recent infection, this could also be suspicious for inflammation.   Dr. Julien Nordmann recommends holding his current treatment for now until his condition can be further assessed by PET scan.    We will see him back for a follow up visit in 14 days for evaluation and to review his scan results and for a more detailed discussion about his current condition and recommended treatment based on the scan results.   I have sent in a prescription for doxycycline 100 mg BID for 7 days to his pharmacy for his cough.   The patient was advised to call immediately if he has any concerning symptoms in the interval. The patient voices understanding of current disease status and treatment options and is in agreement with the current care plan. All questions were answered. The  patient knows to call the clinic with any problems, questions or concerns. We can certainly see the patient much sooner if necessary   Orders Placed This Encounter  Procedures  . NM PET Image Restag (PS) Skull Base To Thigh    Standing Status:   Future    Standing Expiration Date:   09/04/2021    Order Specific Question:   If indicated for the ordered procedure, I authorize the administration of a radiopharmaceutical per Radiology protocol    Answer:   Yes    Order Specific Question:   Preferred imaging location?    Answer:   [facility]    Order Specific Question:   Release to patient    Answer:   Immediate     Emslee Lopezmartinez L Jaxxson Cavanah, PA-C 09/04/20  ADDENDUM: Hematology/Oncology Attending: I had a face-to-face encounter with the patient today.  I recommended his care plan.  This is a very pleasant 59 years old white male with a stage IIIa non-small cell lung cancer, squamous cell carcinoma diagnosed in April 2021 and presented with right upper lobe/suprahilar lung mass in addition to right paratracheal and subcarinal lymphadenopathy.  The patient completed a course of concurrent chemoradiation with weekly carboplatin and paclitaxel with partial response.  He is undergoing consolidation treatment with Imfinzi every 4 weeks status post 3 cycles.  He has been tolerating this treatment well with no concerning adverse effects. He had repeat CT scan of the chest performed recently.  I personally and independently reviewed the scans and discussed the results with the patient and his wife today. The scan showed a new 2.5 cm solid lobulated nodule in the central posterior right upper lobe suspicious for recurrent carcinoma in addition to 2 new adjacent solid pulmonary nodules in the right lung apex the largest measure 1.3 cm suspicious for metastatic disease and no new ill-defined areas of groundglass and airspace opacity in the anterior right upper lobe and posterior right lower lobe.  There was  interval decrease in the size of the subcentimeter right paratracheal, subcarinal and right hilar lymph nodes. I had a lengthy discussion with the patient today about his current condition and treatment options. I recommended for the patient to hold his consolidation immunotherapy for now. I will order a PET scan for further evaluation of these lesions and to rule out disease metastasis versus inflammation especially with his recent complaints of cold symptoms.  We will start him empirically on doxycycline 100 mg p.o. twice daily for 7 days. We will see the patient  back for follow-up visit in around 2 weeks for discussion of the PET scan results and further recommendation regarding his condition. He was advised to call immediately if he has any concerning symptoms in the interval.  Disclaimer: This note was dictated with voice recognition software. Similar sounding words can inadvertently be transcribed and may be missed upon review. Eilleen Kempf, MD 09/06/20

## 2020-09-02 ENCOUNTER — Ambulatory Visit (INDEPENDENT_AMBULATORY_CARE_PROVIDER_SITE_OTHER): Payer: BC Managed Care – PPO

## 2020-09-02 ENCOUNTER — Other Ambulatory Visit: Payer: Self-pay

## 2020-09-02 DIAGNOSIS — C3490 Malignant neoplasm of unspecified part of unspecified bronchus or lung: Secondary | ICD-10-CM | POA: Diagnosis not present

## 2020-09-02 DIAGNOSIS — R918 Other nonspecific abnormal finding of lung field: Secondary | ICD-10-CM | POA: Diagnosis not present

## 2020-09-02 DIAGNOSIS — C3491 Malignant neoplasm of unspecified part of right bronchus or lung: Secondary | ICD-10-CM | POA: Diagnosis not present

## 2020-09-02 DIAGNOSIS — R911 Solitary pulmonary nodule: Secondary | ICD-10-CM | POA: Diagnosis not present

## 2020-09-02 MED ORDER — IOHEXOL 300 MG/ML  SOLN
100.0000 mL | Freq: Once | INTRAMUSCULAR | Status: AC | PRN
  Administered 2020-09-02: 80 mL via INTRAVENOUS

## 2020-09-04 ENCOUNTER — Inpatient Hospital Stay: Payer: BC Managed Care – PPO

## 2020-09-04 ENCOUNTER — Other Ambulatory Visit: Payer: Self-pay

## 2020-09-04 ENCOUNTER — Encounter: Payer: Self-pay | Admitting: Physician Assistant

## 2020-09-04 ENCOUNTER — Inpatient Hospital Stay: Payer: BC Managed Care – PPO | Attending: Internal Medicine | Admitting: Physician Assistant

## 2020-09-04 VITALS — BP 120/77 | HR 83 | Temp 97.0°F | Resp 16 | Ht 72.05 in | Wt 248.8 lb

## 2020-09-04 DIAGNOSIS — Z9221 Personal history of antineoplastic chemotherapy: Secondary | ICD-10-CM | POA: Diagnosis not present

## 2020-09-04 DIAGNOSIS — R590 Localized enlarged lymph nodes: Secondary | ICD-10-CM | POA: Insufficient documentation

## 2020-09-04 DIAGNOSIS — E7800 Pure hypercholesterolemia, unspecified: Secondary | ICD-10-CM | POA: Diagnosis not present

## 2020-09-04 DIAGNOSIS — Z923 Personal history of irradiation: Secondary | ICD-10-CM | POA: Diagnosis not present

## 2020-09-04 DIAGNOSIS — C3411 Malignant neoplasm of upper lobe, right bronchus or lung: Secondary | ICD-10-CM

## 2020-09-04 DIAGNOSIS — E119 Type 2 diabetes mellitus without complications: Secondary | ICD-10-CM | POA: Diagnosis not present

## 2020-09-04 DIAGNOSIS — R059 Cough, unspecified: Secondary | ICD-10-CM

## 2020-09-04 DIAGNOSIS — Z791 Long term (current) use of non-steroidal anti-inflammatories (NSAID): Secondary | ICD-10-CM | POA: Diagnosis not present

## 2020-09-04 DIAGNOSIS — K219 Gastro-esophageal reflux disease without esophagitis: Secondary | ICD-10-CM | POA: Insufficient documentation

## 2020-09-04 DIAGNOSIS — Z79899 Other long term (current) drug therapy: Secondary | ICD-10-CM | POA: Diagnosis not present

## 2020-09-04 DIAGNOSIS — Z7982 Long term (current) use of aspirin: Secondary | ICD-10-CM | POA: Diagnosis not present

## 2020-09-04 DIAGNOSIS — Z7984 Long term (current) use of oral hypoglycemic drugs: Secondary | ICD-10-CM | POA: Insufficient documentation

## 2020-09-04 DIAGNOSIS — R918 Other nonspecific abnormal finding of lung field: Secondary | ICD-10-CM

## 2020-09-04 LAB — CMP (CANCER CENTER ONLY)
ALT: 26 U/L (ref 0–44)
AST: 18 U/L (ref 15–41)
Albumin: 3.3 g/dL — ABNORMAL LOW (ref 3.5–5.0)
Alkaline Phosphatase: 115 U/L (ref 38–126)
Anion gap: 10 (ref 5–15)
BUN: 15 mg/dL (ref 6–20)
CO2: 22 mmol/L (ref 22–32)
Calcium: 9.4 mg/dL (ref 8.9–10.3)
Chloride: 107 mmol/L (ref 98–111)
Creatinine: 1.25 mg/dL — ABNORMAL HIGH (ref 0.61–1.24)
GFR, Estimated: >60 mL/min (ref >60)
Glucose, Bld: 200 mg/dL — ABNORMAL HIGH (ref 70–99)
Potassium: 4.3 mmol/L (ref 3.5–5.1)
Sodium: 139 mmol/L (ref 135–145)
Total Bilirubin: 0.2 mg/dL — ABNORMAL LOW (ref 0.3–1.2)
Total Protein: 7 g/dL (ref 6.5–8.1)

## 2020-09-04 LAB — CBC WITH DIFFERENTIAL (CANCER CENTER ONLY)
Abs Immature Granulocytes: 0.05 10*3/uL (ref 0.00–0.07)
Basophils Absolute: 0.1 10*3/uL (ref 0.0–0.1)
Basophils Relative: 1 %
Eosinophils Absolute: 0.3 10*3/uL (ref 0.0–0.5)
Eosinophils Relative: 4 %
HCT: 33.4 % — ABNORMAL LOW (ref 39.0–52.0)
Hemoglobin: 11.2 g/dL — ABNORMAL LOW (ref 13.0–17.0)
Immature Granulocytes: 1 %
Lymphocytes Relative: 14 %
Lymphs Abs: 1 10*3/uL (ref 0.7–4.0)
MCH: 28.6 pg (ref 26.0–34.0)
MCHC: 33.5 g/dL (ref 30.0–36.0)
MCV: 85.2 fL (ref 80.0–100.0)
Monocytes Absolute: 0.8 10*3/uL (ref 0.1–1.0)
Monocytes Relative: 11 %
Neutro Abs: 5 10*3/uL (ref 1.7–7.7)
Neutrophils Relative %: 69 %
Platelet Count: 209 10*3/uL (ref 150–400)
RBC: 3.92 MIL/uL — ABNORMAL LOW (ref 4.22–5.81)
RDW: 12.7 % (ref 11.5–15.5)
WBC Count: 7.2 10*3/uL (ref 4.0–10.5)
nRBC: 0 % (ref 0.0–0.2)

## 2020-09-04 LAB — TSH: TSH: 0.242 u[IU]/mL — ABNORMAL LOW (ref 0.320–4.118)

## 2020-09-04 MED ORDER — DOXYCYCLINE HYCLATE 100 MG PO TABS: 100.0000 mg | ORAL_TABLET | Freq: Two times a day (BID) | ORAL | 0 refills | Status: DC

## 2020-09-06 ENCOUNTER — Encounter: Payer: Self-pay | Admitting: Physician Assistant

## 2020-09-10 ENCOUNTER — Ambulatory Visit (INDEPENDENT_AMBULATORY_CARE_PROVIDER_SITE_OTHER): Payer: BC Managed Care – PPO | Admitting: Osteopathic Medicine

## 2020-09-10 ENCOUNTER — Encounter: Payer: Self-pay | Admitting: Osteopathic Medicine

## 2020-09-10 ENCOUNTER — Other Ambulatory Visit: Payer: Self-pay

## 2020-09-10 VITALS — BP 121/79 | HR 91 | Temp 98.0°F | Wt 248.1 lb

## 2020-09-10 DIAGNOSIS — E119 Type 2 diabetes mellitus without complications: Secondary | ICD-10-CM

## 2020-09-10 DIAGNOSIS — C3411 Malignant neoplasm of upper lobe, right bronchus or lung: Secondary | ICD-10-CM | POA: Diagnosis not present

## 2020-09-10 LAB — POCT GLYCOSYLATED HEMOGLOBIN (HGB A1C): Hemoglobin A1C: 8.7 % — AB (ref 4.0–5.6)

## 2020-09-10 NOTE — Progress Notes (Signed)
William Farrell is a 59 y.o. male who presents to  [facility] at [facility]  today, 09/10/20, seeking care for the following:  . DM2 follow-up -there have been concerns about hyperglycemia likely as a side effect of cancer treatments particularly steroids.  Patient has also noted some weight gain, probably some room for improvement in terms of diet/activity. . Questions about recent CT scans/lung cancer -images reviewed with patient and his wife who is also present at the visit     [place] with other pertinent findings:  The primary encounter diagnosis was Type 2 diabetes mellitus without complication, without long-term current use of insulin ([place]). A diagnosis of Malignant neoplasm of right upper lobe of lung (HCC) was also pertinent to this visit.   No results found for this or any previous visit (from the past 24 hour(s)).   Patient Instructions  Please discuss prognosis with oncology - they can answer questions better than I can. Will see what PET scan shows.   Will recheck A1C in 3 months. Work on increasing exercise as able. Limit excess carbohydrates.    Orders Placed This Encounter  Procedures  . POCT HgB A1C    No orders of the defined types were placed in this encounter.      Follow-up instructions: Return in about 3 months (around 12/11/2020) for MONITOR A1C - SEE ME SOONER IF NEEDED! .                                         BP 121/79 (BP Location: Left Arm, Patient Position: Sitting, Cuff Size: Large)   Pulse 91   Temp 98 F (36.7 C) (Oral)   Wt 248 lb 1.9 oz (112.5 kg)   BMI 33.60 kg/m   Current Meds  Medication Sig  . acetaminophen (TYLENOL) 500 MG tablet Take 1,000 mg by mouth every 6 (six) hours as needed for moderate pain or headache.  . albuterol (VENTOLIN HFA) 108 (90 Base) MCG/ACT inhaler Inhale 2 puffs into the lungs every 6 (six) hours as needed for  wheezing or shortness of breath.   Marland Kitchen aspirin EC 81 MG tablet Take 81 mg by mouth daily.  Marland Kitchen atorvastatin (LIPITOR) 40 MG tablet TAKE 1 TABLET DAILY  . calcium carbonate (TUMS - DOSED IN MG ELEMENTAL CALCIUM) 500 MG chewable tablet Chew 2 tablets by mouth daily as needed for indigestion or heartburn.  . doxycycline (VIBRA-TABS) 100 MG tablet Take 1 tablet (100 mg total) by mouth 2 (two) times daily.  Marland Kitchen glipiZIDE (GLUCOTROL) 5 MG tablet Take 1 tablet (5 mg total) by mouth 2 (two) times daily before a meal.  . glucose blood test strip Use as instructed up to qid  . meloxicam (MOBIC) 15 MG tablet TAKE 1 TABLET DAILY  . metFORMIN (GLUCOPHAGE) 1000 MG tablet TAKE 1 TABLET TWICE A DAY WITH MEALS  . OneTouch Delica Lancets 16X MISC As directed up to qid  . prochlorperazine (COMPAZINE) 10 MG tablet TAKE 1 TABLET (10 MG TOTAL) BY MOUTH EVERY 6 (SIX) HOURS AS NEEDED FOR NAUSEA OR VOMITING.  . trolamine salicylate (ASPERCREME) 10 % cream Apply 1 application topically as needed for muscle pain.    Results for orders placed or performed in visit on 09/10/20 (from the past 72 hour(s))  POCT HgB A1C     Status: Abnormal   Collection Time: 09/10/20  8:27 AM  Result Value Ref Range   Hemoglobin A1C 8.7 (A) 4.0 - 5.6 %   HbA1c POC (<> result, manual entry)     HbA1c, POC (prediabetic range)     HbA1c, POC (controlled diabetic range)      No results found.     All questions at time of visit were answered - patient instructed to contact office with any additional concerns or updates.  ER/RTC precautions were reviewed with the patient as applicable.   Please note: voice recognition software was used to produce this document, and typos may escape review. Please contact Dr. Sheppard Coil for any needed clarifications.   Total encounter time: 40 minutes face-to-face with patient and education/counseling, reviewing images, going over lab results, discussing plan

## 2020-09-10 NOTE — Patient Instructions (Signed)
Please discuss prognosis with oncology - they can answer questions better than I can. Will see what PET scan shows.   Will recheck A1C in 3 months. Work on increasing exercise as able. Limit excess carbohydrates.

## 2020-09-15 NOTE — Progress Notes (Signed)
[facility] OFFICE PROGRESS NOTE  [name], DO [address] [place] [address]  DIAGNOSIS: Recurrent/progressive non-small cell lung cancer initially diagnosed as stage IIIA(T2a, N2, M0) non-small cell lung cancer, squamous cell carcinoma diagnosed in April 2021, presented with right upper lobe/suprahilar lung mass in addition to right paratracheal and subcarinal lymphadenopathy.  PRIOR THERAPY:  1) Concurrent chemoradiation with weekly carboplatin for AUC of 2 and paclitaxel 45 NG/M2. First dose on Mar 31, 2020. Status post 7 cycles. Last dose was given on 05/12/2020 with partial response. 2) Consolidation immunotherapy with durvalumab 1500 mg IV every 4 weeks. First dose May 12, 2020. Status post 3 cycles.   CURRENT THERAPY: Systemic chemotherapy with carboplatin for an AUC of 5, Taxol 175 mg/m2, and Keytruda 200 mg IV every 3 weeks. First dose expected on 10/02/20.   INTERVAL HISTORY: William Farrell 59 y.o. male returns to the clinic today for a follow-up visit accompanied by his wife.  The patient is feeling fair today without any concerning complaints except for wanting to know the results of his recent PET scan.  At the patient's last appointment, the patient was recovering from a recent cold.  He was endorsing a improving but productive cough.  He had a restaging CT scan performed which showed either worsening disease progression or inflammation from his recent cold.  The patient had a PET scan performed to further evaluate this. He was also given a prescription for doxycycline which has been completed at this time. His cough improved. He only reports a cough every "once in awhile".   Otherwise, the patient denies any fever, chills, or weight loss. He reports his baseline night sweats. He denies any nausea, vomiting, diarrhea, or constipation.  He denies any headache or visual changes.Marland Kitchen He reports some shortness of breath but denies any  hemoptysis.  He denies any rashes or skin changes.  The patient recently had a PET scan performed.  The patient is here today for evaluation and to review his results.  MEDICAL HISTORY: Past Medical History:  Diagnosis Date  . Asthma    as a child  . Diabetes ([place])   . Diverticulitis   . GERD (gastroesophageal reflux disease)   . High cholesterol   . History of kidney stones     ALLERGIES:  is allergic to sitagliptin.  MEDICATIONS:  Current Outpatient Medications  Medication Sig Dispense Refill  . acetaminophen (TYLENOL) 500 MG tablet Take 1,000 mg by mouth every 6 (six) hours as needed for moderate pain or headache.    . albuterol (VENTOLIN HFA) 108 (90 Base) MCG/ACT inhaler Inhale 2 puffs into the lungs every 6 (six) hours as needed for wheezing or shortness of breath.     Marland Kitchen aspirin EC 81 MG tablet Take 81 mg by mouth daily.    Marland Kitchen atorvastatin (LIPITOR) 40 MG tablet TAKE 1 TABLET DAILY 90 tablet 1  . calcium carbonate (TUMS - DOSED IN MG ELEMENTAL CALCIUM) 500 MG chewable tablet Chew 2 tablets by mouth daily as needed for indigestion or heartburn.    . doxycycline (VIBRA-TABS) 100 MG tablet Take 1 tablet (100 mg total) by mouth 2 (two) times daily. 14 tablet 0  . glipiZIDE (GLUCOTROL) 5 MG tablet Take 1 tablet (5 mg total) by mouth 2 (two) times daily before a meal. 180 tablet 3  . glucose blood test strip Use as instructed up to qid 100 each 99  . meloxicam (MOBIC) 15 MG tablet TAKE 1 TABLET  DAILY 90 tablet 3  . metFORMIN (GLUCOPHAGE) 1000 MG tablet TAKE 1 TABLET TWICE A DAY WITH MEALS 180 tablet 3  . OneTouch Delica Lancets 35K MISC As directed up to qid 100 each 99  . prochlorperazine (COMPAZINE) 10 MG tablet Take 1 tablet (10 mg total) by mouth every 6 (six) hours as needed for nausea or vomiting. 30 tablet 2  . sucralfate (CARAFATE) 1 g tablet Take 1 tablet (1 g total) by mouth 4 (four) times daily -  with meals and at bedtime. Crush 1 tablet in 1 oz water and drink 5 min before  meals for radiation induced esophagitis (Patient not taking: Reported on 09/10/2020) 120 tablet 2  . trolamine salicylate (ASPERCREME) 10 % cream Apply 1 application topically as needed for muscle pain.     No current facility-administered medications for this visit.    SURGICAL HISTORY:  Past Surgical History:  Procedure Laterality Date  . broken bone repair    . BRONCHIAL BIOPSY  03/11/2020   Procedure: BRONCHIAL BIOPSIES;  Surgeon: Garner Nash, DO;  Location: [facility];  Service: Pulmonary;;  . COLONOSCOPY    . CRYOTHERAPY  03/11/2020   Procedure: CRYOTHERAPY;  Surgeon: Garner Nash, DO;  Location: [facility];  Service: Pulmonary;;  . FINE NEEDLE ASPIRATION  03/11/2020   Procedure: FINE NEEDLE ASPIRATION (FNA) LINEAR;  Surgeon: Garner Nash, DO;  Location: [facility];  Service: Pulmonary;;  . KNEE ARTHROSCOPY WITH ANTERIOR CRUCIATE LIGAMENT (ACL) REPAIR     x 2  . VIDEO BRONCHOSCOPY WITH ENDOBRONCHIAL ULTRASOUND N/A 03/11/2020   Procedure: VIDEO BRONCHOSCOPY WITH ENDOBRONCHIAL ULTRASOUND;  Surgeon: Garner Nash, DO;  Location: [facility];  Service: Pulmonary;  Laterality: N/A;    REVIEW OF SYSTEMS:   Review of Systems  Constitutional: Negative for appetite change, chills, fatigue, fever and unexpected weight change.  HENT:   Negative for mouth sores, nosebleeds, sore throat and trouble swallowing.   Eyes: Negative for eye problems and icterus.  Respiratory: Positive for mild cough. Negative for hemoptysis, shortness of breath and wheezing.   Cardiovascular: Negative for chest pain and leg swelling.  Gastrointestinal: Negative for abdominal pain, constipation, diarrhea, nausea and vomiting.  Genitourinary: Negative for bladder incontinence, difficulty urinating, dysuria, frequency and hematuria.   Musculoskeletal: Negative for back pain, gait problem, neck pain and neck stiffness.  Skin: Negative for itching and rash.  Neurological: Negative for dizziness,  extremity weakness, gait problem, headaches, light-headedness and seizures.  Hematological: Negative for adenopathy. Does not bruise/bleed easily.  Psychiatric/Behavioral: Negative for confusion, depression and sleep disturbance. The patient is not nervous/anxious.     PHYSICAL EXAMINATION:  Blood pressure 116/80, pulse 83, temperature 97.8 F (36.6 C), temperature source Tympanic, resp. rate 18, height 6' 0.05" (1.83 m), weight 248 lb 11.2 oz (112.8 kg), SpO2 99 %.  ECOG PERFORMANCE STATUS: 1 - Symptomatic but completely ambulatory  Physical Exam  Constitutional: Oriented to person, place, and time and well-developed, well-nourished, and in no distress.  HENT:  Head: Normocephalic and atraumatic.  Mouth/Throat: Oropharynx is clear and moist. No oropharyngeal exudate.  Eyes: Conjunctivae are normal. Right eye exhibits no discharge. Left eye exhibits no discharge. No scleral icterus.  Neck: Normal range of motion. Neck supple.  Cardiovascular: Normal rate, regular rhythm, normal heart sounds and intact distal pulses.   Pulmonary/Chest: Effort normal and breath sounds normal. No respiratory distress. No wheezes. No rales.  Abdominal: Soft. Bowel sounds are normal. Exhibits no distension and no mass. There is no  tenderness.  Musculoskeletal: Normal range of motion. Exhibits no edema.  Lymphadenopathy:    No cervical adenopathy.  Neurological: Alert and oriented to person, place, and time. Exhibits normal muscle tone. Gait normal. Coordination normal.  Skin: Skin is warm and dry. No rash noted. Not diaphoretic. No erythema. No pallor.  Psychiatric: Mood, memory and judgment normal.  Vitals reviewed.  LABORATORY DATA: Lab Results  Component Value Date   WBC 7.2 09/04/2020   HGB 11.2 (L) 09/04/2020   HCT 33.4 (L) 09/04/2020   MCV 85.2 09/04/2020   PLT 209 09/04/2020      Chemistry      Component Value Date/Time   NA 139 09/04/2020 0746   K 4.3 09/04/2020 0746   CL 107 09/04/2020  0746   CO2 22 09/04/2020 0746   BUN 15 09/04/2020 0746   CREATININE 1.25 (H) 09/04/2020 0746   CREATININE 1.10 01/14/2020 0859      Component Value Date/Time   CALCIUM 9.4 09/04/2020 0746   ALKPHOS 115 09/04/2020 0746   AST 18 09/04/2020 0746   ALT 26 09/04/2020 0746   BILITOT 0.2 (L) 09/04/2020 0746       RADIOGRAPHIC STUDIES:  CT Chest W Contrast  Result Date: 09/02/2020 CLINICAL DATA:  Follow-up right non-small cell lung carcinoma. EXAM: CT CHEST WITH CONTRAST TECHNIQUE: Multidetector CT imaging of the chest was performed during intravenous contrast administration. CONTRAST:  10mL OMNIPAQUE IOHEXOL 300 MG/ML  SOLN COMPARISON:  06/02/2020 FINDINGS: Cardiovascular:  No acute findings. Mediastinum/Nodes: Small less than 1 cm right paratracheal, subcarinal, and right hilar lymph nodes show decreased in size since previous study. No new or increased sites of lymphadenopathy identified. Lungs/Pleura: New solid lobulated nodule is seen in the central posterior right upper lobe which measures 2.5 x 2.0 cm, suspicious for recurrent carcinoma. Two adjacent solid nodules are seen in the right lung apex, largest measuring 1.3 cm. These are also suspicious for metastatic disease. New ill-defined areas of ground-glass and airspace opacity are seen in the anterior right upper lobe and superior right lower lobe, which may be due to post treatment changes although differential diagnosis also includes drug reaction and infection. No evidence of pleural effusion. Upper Abdomen:  Unremarkable. Musculoskeletal:  No suspicious bone lesions. IMPRESSION: New 2.5 cm solid lobulated nodule in central posterior right upper lobe, suspicious for recurrent carcinoma. Two new adjacent solid pulmonary nodules in right lung apex, largest measuring 1.3 cm, also suspicious for metastatic disease. New ill-defined areas of ground-glass and airspace opacity in anterior right upper lobe and superior right lower lobe, which may be  due to post treatment changes, drug reaction, or infection. Interval decrease in size of sub-cm right paratracheal, subcarinal, and right hilar lymph nodes. Electronically Signed   By: Marlaine Hind M.D.   On: 09/02/2020 14:04   NM PET Image Restag (PS) Skull Base To Thigh  Result Date: 09/16/2020 CLINICAL DATA:  Subsequent treatment strategy for non-small cell lung cancer. EXAM: NUCLEAR MEDICINE PET SKULL BASE TO THIGH TECHNIQUE: 12.4 mCi F-18 FDG was injected intravenously. Full-ring PET imaging was performed from the skull base to thigh after the radiotracer. CT data was obtained and used for attenuation correction and anatomic localization. Fasting blood glucose: 140 mg/dl COMPARISON:  03/14/2020 FINDINGS: Mediastinal blood pool activity: SUV max 3.21 Liver activity: SUV max NA NECK: New right level Vb lymph node measures 0.6 cm short axis with SUV max of 8.7, image 39/4. New right level III lymph node measures 1 cm and has an  SUV max of 7.26, image 42/4. Right level IV lymph node measures 1.4 cm within SUV max of 6.07. Previously this measured 1.3 cm with SUV max of 10.7. Incidental CT findings: none CHEST: New FDG avid right retropectoral lymph node measures 1 cm with SUV max of 11.24, image 51/4. Right paratracheal node measures 0.8 cm within SUV max of 4.27. Previously 1.6 cm with SUV max of 9.12. Lower right paratracheal node measures 1 cm with SUV max of 3.3, image 72/4. Previously this measured 1.6 cm with SUV max of 9.13. Subcarinal lymph node measures 0.7 cm with SUV max of 3.76, image 76/4. Previously 1.6 cm with SUV max of 11.7. The right upper lobe perihilar lesion measures 2.2 cm with SUV max of 10.3, image 71/4. Previously this measured 3.7 cm with SUV max of 11.9. Within the right lung apex there are 2 adjacent, enlarging nodules which are FDG avid. The larger, more anterior nodule measures 1.3 cm and has an SUV max of 10.98, image 60/4. Previously this measured 4 mm. The smaller, more  posterior nodule measures 6 mm with SUV max of 8.06, image 59/4. Previously this nodule measured 4 mm. Incidental CT findings: Bandlike area of ground-glass attenuation, airspace consolidation and architectural distortion noted within the superior segment of right lower lobe and right upper lobe. There is corresponding increased FDG uptake within this area. Findings compatible with changes secondary to external beam radiation. ABDOMEN/PELVIS: No abnormal tracer uptake identified within the liver, spleen, adrenal glands, or pancreas. No FDG avid abdominopelvic lymph nodes. Incidental CT findings: Aortic atherosclerosis. No aneurysm. Distal colonic diverticulosis noted without acute inflammation. SKELETON: No focal hypermetabolic activity to suggest skeletal metastasis. Incidental CT findings: none IMPRESSION: 1. Interval mixed response to therapy. 2. There has been decrease in size and degree of FDG uptake associated with the right upper lobe perihilar lung mass. There has also been interval decrease in size and FDG uptake associated with the right paratracheal and subcarinal lymph nodes. 3. New FDG avid right level VB and level III cervical lymph nodes. There is also new FDG avid right retropectoral lymph node. 4. There are 2 enlarging FDG avid lymph nodes pulmonary nodules within the right lung apex. Electronically Signed   By: Kerby Moors M.D.   On: 09/16/2020 10:14     ASSESSMENT/PLAN:  This is a 59 year old Caucasian male diagnosed withstage IIIa non-small cell lung cancer, squamous cell carcinoma in April 2021 presented with right upper lobe/suprahilar lung mass in addition to right paratracheal and subcarinal lymphadenopathy.   The patient completed a course of concurrent chemoradiation with weekly carboplatin for AUC of 2 and paclitaxel 45 MG/M2. He is status post 7 cycles. He tolerated the previous course of his treatment well except for mild dysphagia and odynophagia.  He is currently  undergoing consolidation treatment with immunotherapy with Imfinzi 1500 mg IV every 4 weeks. Status post 3 cycles.   His most recent CT scan of the chest showed a new 2.5 cm solid lobulated nodule in central posterior right upper lobe, suspicious for recurrent carcinoma. There was also two new adjacent solid pulmonary nodules in right lung apex, largest measuring 1.3 cm, also suspicious for metastatic disease vs inflammation.  The patient recently had a PET scan performed to further evaluate this. Dr. Julien Nordmann personally and independently reviewed the scan and discussed results with the patient today. The scan showed that here has been decrease in size and degree of FDG uptake associated with the right upper lobe perihilar  lung mass. There has also been interval decrease in size and FDG uptake associated with the right paratracheal and subcarinal lymph nodes. There was also New FDG avid right level VB and level III cervical lymph nodes. There is also new FDG avid right retropectoral lymph node and there are 2 enlarging FDG avid lymph nodes pulmonary nodules within the right lung apex.  Dr. Julien Nordmann had a lengthly discussion with the patient today about his current condition and treatment options. Dr. Julien Nordmann recommended changing his treatment to systemic chemotherapy with carboplatin for an AUC of 5, paclitaxel 175 mg/m, and Keytruda 200 mg IV every 3 weeks. Dr. Julien Nordmann also recommended a second opinion at [facility] with Dr. [name]. The patient is interested in proceeding with systemic chemotherapy as well as a second opinion. I have placed the referral for the second opinion.  He is expected to start his first dose of this treatment on 10/02/20.  We discussed the adverse side effects of treatment including but not limited to alopecia, myelosuppression, nausea and vomiting, peripheral neuropathy, liver or renal dysfunction as well as immunotherapy mediated adverse effects.   I sent a prescription  for Compazine 10 mg every 6 hours as needed for nausea.   The patient will follow-up in 2 weeks for a one-week follow-up visit after completing his first cycle of chemotherapy.  He will have his weekly labs performed closer to home. The results will be faxed to our office.   The patient was advised to call immediately if he has any concerning symptoms in the interval. The patient voices understanding of current disease status and treatment options and is in agreement with the current care plan. All questions were answered. The patient knows to call the clinic with any problems, questions or concerns. We can certainly see the patient much sooner if necessary    Orders Placed This Encounter  Procedures  . CBC with Differential (Cancer Center Only)    Standing Status:   Standing    Number of Occurrences:   12    Standing Expiration Date:   09/23/2021  . CMP ([facility] only)    Standing Status:   Standing    Number of Occurrences:   12    Standing Expiration Date:   09/23/2021  . TSH    Standing Status:   Standing    Number of Occurrences:   36    Standing Expiration Date:   09/23/2021  . Ambulatory referral to Hematology / Oncology    Referral Priority:   Routine    Referral Type:   Consultation    Referral Reason:   Second Opinion    Referred to Provider:   Lissa Morales, MD    Number of Visits Requested:   [place], PA-C 09/23/20  ADDENDUM: Hematology/Oncology Attending: I had a face-to-face encounter with the patient today.  I recommended his care plan.  This is a very pleasant 60 years old white male diagnosed with a stage IIIa non-small cell lung cancer, squamous cell carcinoma status post a course of concurrent chemoradiation with weekly carboplatin and paclitaxel followed by 3 cycles of consolidation immunotherapy with Imfinzi discontinued secondary to disease progression seen on the restaging scan and confirmed with a PET scan performed  recently. I personally and independently reviewed the scan images and discussed the results with the patient and his wife today. Also has a lengthy discussion with the patient about his current condition and treatment options.  I explained to  the patient that the consolidation immunotherapy was not effective in preventing the disease progression. I discussed with the patient the option of palliative care versus palliative systemic chemotherapy with carboplatin for AUC of 5, paclitaxel 175 mg/M2 and Keytruda 200 mg IV every 3 weeks.  I discussed with the patient the adverse effect of this treatment including but not limited to alopecia, myelosuppression, nausea and vomiting, peripheral neuropathy, liver or renal dysfunction as well as immunotherapy adverse effects. The patient is interested in proceeding with this treatment.  I will also send him to see Dr. Durenda Hurt at [facility] [facility] center for second opinion regarding his condition. He is expected to start the first cycle of this treatment next week. The patient will come back for follow-up visit in 2 weeks for evaluation and management of any adverse effect of his treatment. He was advised to call immediately if he has any concerning symptoms in the interval.  Disclaimer: This note was dictated with voice recognition software. Similar sounding words can inadvertently be transcribed and may be missed upon review. Eilleen Kempf, MD 09/23/20

## 2020-09-16 ENCOUNTER — Ambulatory Visit (HOSPITAL_COMMUNITY)
Admission: RE | Admit: 2020-09-16 | Discharge: 2020-09-16 | Disposition: A | Discharge status: Home / Self Care | Payer: BC Managed Care – PPO | Source: Ambulatory Visit | Attending: Physician Assistant | Admitting: Physician Assistant

## 2020-09-16 ENCOUNTER — Other Ambulatory Visit: Payer: Self-pay

## 2020-09-16 DIAGNOSIS — C3411 Malignant neoplasm of upper lobe, right bronchus or lung: Secondary | ICD-10-CM | POA: Diagnosis not present

## 2020-09-16 DIAGNOSIS — J181 Lobar pneumonia, unspecified organism: Secondary | ICD-10-CM | POA: Diagnosis not present

## 2020-09-16 DIAGNOSIS — R918 Other nonspecific abnormal finding of lung field: Secondary | ICD-10-CM | POA: Diagnosis not present

## 2020-09-16 DIAGNOSIS — I700 Atherosclerosis of aorta: Secondary | ICD-10-CM | POA: Diagnosis not present

## 2020-09-16 DIAGNOSIS — C3490 Malignant neoplasm of unspecified part of unspecified bronchus or lung: Secondary | ICD-10-CM | POA: Diagnosis not present

## 2020-09-16 LAB — GLUCOSE, CAPILLARY: Glucose-Capillary: 140 mg/dL — ABNORMAL HIGH (ref 70–99)

## 2020-09-16 MED ORDER — FLUDEOXYGLUCOSE F - 18 (FDG) INJECTION
12.4000 | Freq: Once | INTRAVENOUS | Status: AC
  Administered 2020-09-16: 12.4 via INTRAVENOUS

## 2020-09-18 ENCOUNTER — Encounter: Payer: Self-pay | Admitting: Internal Medicine

## 2020-09-23 ENCOUNTER — Inpatient Hospital Stay: Payer: BC Managed Care – PPO | Attending: Internal Medicine | Admitting: Physician Assistant

## 2020-09-23 ENCOUNTER — Telehealth: Payer: Self-pay | Admitting: Internal Medicine

## 2020-09-23 ENCOUNTER — Other Ambulatory Visit: Payer: Self-pay

## 2020-09-23 ENCOUNTER — Encounter: Payer: Self-pay | Admitting: Physician Assistant

## 2020-09-23 ENCOUNTER — Other Ambulatory Visit: Payer: Self-pay | Admitting: Internal Medicine

## 2020-09-23 VITALS — BP 116/80 | HR 83 | Temp 97.8°F | Resp 18 | Ht 72.05 in | Wt 248.7 lb

## 2020-09-23 DIAGNOSIS — E119 Type 2 diabetes mellitus without complications: Secondary | ICD-10-CM | POA: Diagnosis not present

## 2020-09-23 DIAGNOSIS — Z79899 Other long term (current) drug therapy: Secondary | ICD-10-CM | POA: Diagnosis not present

## 2020-09-23 DIAGNOSIS — K5792 Diverticulitis of intestine, part unspecified, without perforation or abscess without bleeding: Secondary | ICD-10-CM | POA: Insufficient documentation

## 2020-09-23 DIAGNOSIS — E7800 Pure hypercholesterolemia, unspecified: Secondary | ICD-10-CM | POA: Insufficient documentation

## 2020-09-23 DIAGNOSIS — Z923 Personal history of irradiation: Secondary | ICD-10-CM | POA: Insufficient documentation

## 2020-09-23 DIAGNOSIS — Z5189 Encounter for other specified aftercare: Secondary | ICD-10-CM | POA: Diagnosis not present

## 2020-09-23 DIAGNOSIS — Z7189 Other specified counseling: Secondary | ICD-10-CM | POA: Diagnosis not present

## 2020-09-23 DIAGNOSIS — K219 Gastro-esophageal reflux disease without esophagitis: Secondary | ICD-10-CM | POA: Insufficient documentation

## 2020-09-23 DIAGNOSIS — Z5111 Encounter for antineoplastic chemotherapy: Secondary | ICD-10-CM | POA: Diagnosis not present

## 2020-09-23 DIAGNOSIS — Z7982 Long term (current) use of aspirin: Secondary | ICD-10-CM | POA: Insufficient documentation

## 2020-09-23 DIAGNOSIS — Z7984 Long term (current) use of oral hypoglycemic drugs: Secondary | ICD-10-CM | POA: Insufficient documentation

## 2020-09-23 DIAGNOSIS — Z791 Long term (current) use of non-steroidal anti-inflammatories (NSAID): Secondary | ICD-10-CM | POA: Insufficient documentation

## 2020-09-23 DIAGNOSIS — R5383 Other fatigue: Secondary | ICD-10-CM | POA: Insufficient documentation

## 2020-09-23 DIAGNOSIS — C3411 Malignant neoplasm of upper lobe, right bronchus or lung: Secondary | ICD-10-CM | POA: Diagnosis not present

## 2020-09-23 MED ORDER — PROCHLORPERAZINE MALEATE 10 MG PO TABS: 10.0000 mg | ORAL_TABLET | Freq: Four times a day (QID) | ORAL | 2 refills | Status: DC | PRN

## 2020-09-23 NOTE — Progress Notes (Signed)
DISCONTINUE ON PATHWAY REGIMEN - Non-Small Cell Lung     A cycle is every 28 days:     Durvalumab   **Always confirm dose/schedule in your pharmacy ordering system**  REASON: Disease Progression PRIOR TREATMENT: [id number]: Durvalumab 1,500 mg q28 Days x up to 12 Months TREATMENT RESPONSE: Progressive Disease (PD)  START ON PATHWAY REGIMEN - Non-Small Cell Lung     A cycle is every 21 days:     Pembrolizumab      Paclitaxel      Carboplatin   **Always confirm dose/schedule in your pharmacy ordering system**  Patient Characteristics: Stage IV Metastatic, Squamous, PS = 0, 1, First Line, PD-L1 Expression Positive 1-49% (TPS) / Negative / Not Tested / Awaiting Test Results and Immunotherapy Candidate Therapeutic Status: Stage IV Metastatic Histology: Squamous Cell Line of therapy: First Line ECOG Performance Status: 1 PD-L1 Expression Status: Quantity Not Sufficient Immunotherapy Candidate Status: Candidate for Immunotherapy Intent of Therapy: Non-Curative / Palliative Intent, Discussed with Patient

## 2020-09-23 NOTE — Patient Instructions (Addendum)
-  There are two main categories of lung cancer, they are named based on the size of the cancer cell. One is called Non-Small cell lung cancer. The other type is Small Cell Lung Cancer -The sample (biopsy) that they took of your tumor was consistent with a subtype of Non-small cell lung cancer called Squamous Cell Carcinoma.  -We covered a lot of important information at your appointment today regarding what the treatment plan is moving forward. Here are the the main points that were discussed at your office visit with [place] today:  -The treatment that you will receive consists of two chemotherapy drugs, called Carboplatin and Paclitaxel (also referred to as Taxol) and one immunotherapy drug called Keytruda (pembrolizumab).  -We are planning on starting your treatment next week on 10/02/20. -Your treatment will be given once every 3 weeks. We will check your labs once a week for the first ~5 treatments just to make sure that important components of your blood are in an acceptable range -You will need to return 2 days after your treatment to receive an injection. This injection is important because it boosts your infection fighting cells in your body and helps protect you from getting an infection.  -We will get a CT scan after 3 treatments to check on the progress of treatment  Medications:  -Compazine was sent to your pharmacy. This medication is for nausea. You may take this every 6 hours as needed if you feel nausous.   Referrals or Imaging:  -We will refer you to [facility] for a second opinion  Follow up:  -We will see you back for a follow up visit in about  2 weeks to see how your first treatment went and to make sure you are not having any side effects from treatment.   If you need to contact our office, please do not hesitate, we are here to help. Our number is [phone]. When you call, ask to speak to Cassie's or Dr. Worthy Flank nurse.

## 2020-09-23 NOTE — Telephone Encounter (Signed)
RAFOADL:[id number] Faxed medical records to Dr. Aniceto Boss @ [facility] @ fax# [phone].

## 2020-09-24 ENCOUNTER — Encounter: Payer: Self-pay | Admitting: *Deleted

## 2020-09-24 NOTE — Progress Notes (Signed)
I received a message from [facility] they need more information to complete the referral.  I faxed required documentation and updated Deb with [facility].

## 2020-09-25 ENCOUNTER — Telehealth: Payer: Self-pay | Admitting: Medical Oncology

## 2020-09-25 ENCOUNTER — Encounter: Payer: Self-pay | Admitting: Internal Medicine

## 2020-09-25 NOTE — Telephone Encounter (Signed)
  Pt changed lab collection site from [facility] to [facility]. I sent secure chat message to Burman Nieves and Kenney Houseman at [facility] to call pt to schedule weekly labs.

## 2020-09-25 NOTE — Progress Notes (Signed)
.  The following biosimilar Udenyca ([id number]) has been selected for use in this patient per insurance requirements.  Henreitta Leber, PharmD

## 2020-09-26 NOTE — Progress Notes (Signed)
Pharmacist Chemotherapy Monitoring - Initial Assessment    Anticipated start date: 10/02/2020   Regimen:  . Are orders appropriate based on the patient's diagnosis, regimen, and cycle? Yes . Does the plan date match the patient's scheduled date? Yes . Is the sequencing of drugs appropriate? Yes . Are the premedications appropriate for the patient's regimen? Yes . Prior Authorization for treatment is: Pending o If applicable, is the correct biosimilar selected based on the patient's insurance? not applicable  Organ Function and Labs: Marland Kitchen Are dose adjustments needed based on the patient's renal function, hepatic function, or hematologic function? Yes . Are appropriate labs ordered prior to the start of patient's treatment? Yes . Other organ system assessment, if indicated: N/A . The following baseline labs, if indicated, have been ordered: pembrolizumab: baseline TSH +/- T4  Dose Assessment: . Are the drug doses appropriate? Yes . Are the following correct: o Drug concentrations Yes o IV fluid compatible with drug Yes o Administration routes Yes o Timing of therapy Yes . If applicable, does the patient have documented access for treatment and/or plans for port-a-cath placement?no . If applicable, have lifetime cumulative doses been properly documented and assessed? yes Lifetime Dose Tracking  . Carboplatin: 1,770 mg = 0.01 % of the maximum lifetime dose of 999,999,999 mg  o   Toxicity Monitoring/Prevention: . The patient has the following take home antiemetics prescribed: Ondansetron and Prochlorperazine . The patient has the following take home medications prescribed: N/A . Medication allergies and previous infusion related reactions, if applicable, have been reviewed and addressed. No . The patient's current medication list has been assessed for drug-drug interactions with their chemotherapy regimen. no significant drug-drug interactions were identified on review.  Order  Review: . Are the treatment plan orders signed? Yes . Is the patient scheduled to see a provider prior to their treatment? No  I verify that I have reviewed each item in the above checklist and answered each question accordingly.  Toye Rouillard D 09/26/2020 1:23 PM }

## 2020-09-29 ENCOUNTER — Other Ambulatory Visit: Payer: BC Managed Care – PPO

## 2020-09-29 ENCOUNTER — Ambulatory Visit: Payer: BC Managed Care – PPO

## 2020-10-01 ENCOUNTER — Ambulatory Visit: Payer: BC Managed Care – PPO

## 2020-10-02 ENCOUNTER — Inpatient Hospital Stay: Payer: BC Managed Care – PPO

## 2020-10-02 ENCOUNTER — Other Ambulatory Visit: Payer: Self-pay | Admitting: Medical Oncology

## 2020-10-02 ENCOUNTER — Other Ambulatory Visit: Payer: Self-pay

## 2020-10-02 ENCOUNTER — Inpatient Hospital Stay: Payer: BC Managed Care – PPO | Admitting: Internal Medicine

## 2020-10-02 VITALS — BP 122/86 | HR 80 | Temp 98.3°F | Resp 16

## 2020-10-02 DIAGNOSIS — K5792 Diverticulitis of intestine, part unspecified, without perforation or abscess without bleeding: Secondary | ICD-10-CM | POA: Diagnosis not present

## 2020-10-02 DIAGNOSIS — E119 Type 2 diabetes mellitus without complications: Secondary | ICD-10-CM | POA: Diagnosis not present

## 2020-10-02 DIAGNOSIS — I878 Other specified disorders of veins: Secondary | ICD-10-CM

## 2020-10-02 DIAGNOSIS — E7800 Pure hypercholesterolemia, unspecified: Secondary | ICD-10-CM | POA: Diagnosis not present

## 2020-10-02 DIAGNOSIS — Z7984 Long term (current) use of oral hypoglycemic drugs: Secondary | ICD-10-CM | POA: Diagnosis not present

## 2020-10-02 DIAGNOSIS — C3411 Malignant neoplasm of upper lobe, right bronchus or lung: Secondary | ICD-10-CM

## 2020-10-02 DIAGNOSIS — R5383 Other fatigue: Secondary | ICD-10-CM | POA: Diagnosis not present

## 2020-10-02 DIAGNOSIS — Z5189 Encounter for other specified aftercare: Secondary | ICD-10-CM | POA: Diagnosis not present

## 2020-10-02 DIAGNOSIS — Z79899 Other long term (current) drug therapy: Secondary | ICD-10-CM | POA: Diagnosis not present

## 2020-10-02 DIAGNOSIS — Z923 Personal history of irradiation: Secondary | ICD-10-CM | POA: Diagnosis not present

## 2020-10-02 DIAGNOSIS — Z791 Long term (current) use of non-steroidal anti-inflammatories (NSAID): Secondary | ICD-10-CM | POA: Diagnosis not present

## 2020-10-02 DIAGNOSIS — K219 Gastro-esophageal reflux disease without esophagitis: Secondary | ICD-10-CM | POA: Diagnosis not present

## 2020-10-02 DIAGNOSIS — Z7982 Long term (current) use of aspirin: Secondary | ICD-10-CM | POA: Diagnosis not present

## 2020-10-02 DIAGNOSIS — Z5111 Encounter for antineoplastic chemotherapy: Secondary | ICD-10-CM | POA: Diagnosis not present

## 2020-10-02 LAB — CMP (CANCER CENTER ONLY)
ALT: 20 U/L (ref 0–44)
AST: 15 U/L (ref 15–41)
Albumin: 3.4 g/dL — ABNORMAL LOW (ref 3.5–5.0)
Alkaline Phosphatase: 123 U/L (ref 38–126)
Anion gap: 10 (ref 5–15)
BUN: 16 mg/dL (ref 6–20)
CO2: 21 mmol/L — ABNORMAL LOW (ref 22–32)
Calcium: 9.2 mg/dL (ref 8.9–10.3)
Chloride: 105 mmol/L (ref 98–111)
Creatinine: 1.31 mg/dL — ABNORMAL HIGH (ref 0.61–1.24)
GFR, Estimated: >60 mL/min (ref >60)
Glucose, Bld: 250 mg/dL — ABNORMAL HIGH (ref 70–99)
Potassium: 4.2 mmol/L (ref 3.5–5.1)
Sodium: 136 mmol/L (ref 135–145)
Total Bilirubin: 0.3 mg/dL (ref 0.3–1.2)
Total Protein: 7.1 g/dL (ref 6.5–8.1)

## 2020-10-02 LAB — CBC WITH DIFFERENTIAL (CANCER CENTER ONLY)
Abs Immature Granulocytes: 0.03 10*3/uL (ref 0.00–0.07)
Basophils Absolute: 0.1 10*3/uL (ref 0.0–0.1)
Basophils Relative: 1 %
Eosinophils Absolute: 0.3 10*3/uL (ref 0.0–0.5)
Eosinophils Relative: 4 %
HCT: 34.2 % — ABNORMAL LOW (ref 39.0–52.0)
Hemoglobin: 11.2 g/dL — ABNORMAL LOW (ref 13.0–17.0)
Immature Granulocytes: 0 %
Lymphocytes Relative: 13 %
Lymphs Abs: 1 10*3/uL (ref 0.7–4.0)
MCH: 27.3 pg (ref 26.0–34.0)
MCHC: 32.7 g/dL (ref 30.0–36.0)
MCV: 83.2 fL (ref 80.0–100.0)
Monocytes Absolute: 0.7 10*3/uL (ref 0.1–1.0)
Monocytes Relative: 10 %
Neutro Abs: 5.3 10*3/uL (ref 1.7–7.7)
Neutrophils Relative %: 72 %
Platelet Count: 229 10*3/uL (ref 150–400)
RBC: 4.11 MIL/uL — ABNORMAL LOW (ref 4.22–5.81)
RDW: 13.5 % (ref 11.5–15.5)
WBC Count: 7.4 10*3/uL (ref 4.0–10.5)
nRBC: 0 % (ref 0.0–0.2)

## 2020-10-02 LAB — TSH: TSH: 4.312 u[IU]/mL — ABNORMAL HIGH (ref 0.320–4.118)

## 2020-10-02 MED ORDER — DIPHENHYDRAMINE HCL 50 MG/ML IJ SOLN
INTRAMUSCULAR | Status: AC
  Filled 2020-10-02: qty 1

## 2020-10-02 MED ORDER — SODIUM CHLORIDE 0.9 % IV SOLN
175.0000 mg/m2 | Freq: Once | INTRAVENOUS | Status: AC
  Administered 2020-10-02: 420 mg via INTRAVENOUS
  Filled 2020-10-02: qty 70

## 2020-10-02 MED ORDER — SODIUM CHLORIDE 0.9 % IV SOLN
200.0000 mg | Freq: Once | INTRAVENOUS | Status: AC
  Administered 2020-10-02: 200 mg via INTRAVENOUS
  Filled 2020-10-02: qty 8

## 2020-10-02 MED ORDER — FAMOTIDINE IN NACL 20-0.9 MG/50ML-% IV SOLN
20.0000 mg | Freq: Once | INTRAVENOUS | Status: AC
  Administered 2020-10-02: 20 mg via INTRAVENOUS

## 2020-10-02 MED ORDER — SODIUM CHLORIDE 0.9 % IV SOLN
Freq: Once | INTRAVENOUS | Status: AC
  Filled 2020-10-02: qty 250

## 2020-10-02 MED ORDER — DIPHENHYDRAMINE HCL 50 MG/ML IJ SOLN
50.0000 mg | Freq: Once | INTRAMUSCULAR | Status: AC
  Administered 2020-10-02: 50 mg via INTRAVENOUS

## 2020-10-02 MED ORDER — PALONOSETRON HCL INJECTION 0.25 MG/5ML
0.2500 mg | Freq: Once | INTRAVENOUS | Status: AC
  Administered 2020-10-02: 0.25 mg via INTRAVENOUS

## 2020-10-02 MED ORDER — SODIUM CHLORIDE 0.9 % IV SOLN
10.0000 mg | Freq: Once | INTRAVENOUS | Status: AC
  Administered 2020-10-02: 10 mg via INTRAVENOUS
  Filled 2020-10-02: qty 10

## 2020-10-02 MED ORDER — SODIUM CHLORIDE 0.9 % IV SOLN
600.0000 mg | Freq: Once | INTRAVENOUS | Status: AC
  Administered 2020-10-02: 600 mg via INTRAVENOUS
  Filled 2020-10-02: qty 60

## 2020-10-02 MED ORDER — SODIUM CHLORIDE 0.9 % IV SOLN
150.0000 mg | Freq: Once | INTRAVENOUS | Status: AC
  Administered 2020-10-02: 150 mg via INTRAVENOUS
  Filled 2020-10-02: qty 150

## 2020-10-02 MED ORDER — FAMOTIDINE IN NACL 20-0.9 MG/50ML-% IV SOLN
INTRAVENOUS | Status: AC
  Filled 2020-10-02: qty 50

## 2020-10-02 MED ORDER — PALONOSETRON HCL INJECTION 0.25 MG/5ML
INTRAVENOUS | Status: AC
  Filled 2020-10-02: qty 5

## 2020-10-02 NOTE — Patient Instructions (Signed)
[facility] Discharge Instructions for Patients Receiving Chemotherapy  Today you received the following chemotherapy agents Keytruda, Taxol and Carboplatin  To help prevent nausea and vomiting after your treatment, we encourage you to take your nausea medication as prescribed by MD. **DO NOT TAKE ZOFRAN FOR 3 DAYS AFTER CHEMOTHERAPY**   If you develop nausea and vomiting that is not controlled by your nausea medication, call the clinic.   BELOW ARE SYMPTOMS THAT SHOULD BE REPORTED IMMEDIATELY:  *FEVER GREATER THAN 100.5 F  *CHILLS WITH OR WITHOUT FEVER  NAUSEA AND VOMITING THAT IS NOT CONTROLLED WITH YOUR NAUSEA MEDICATION  *UNUSUAL SHORTNESS OF BREATH  *UNUSUAL BRUISING OR BLEEDING  TENDERNESS IN MOUTH AND THROAT WITH OR WITHOUT PRESENCE OF ULCERS  *URINARY PROBLEMS  *BOWEL PROBLEMS  UNUSUAL RASH Items with * indicate a potential emergency and should be followed up as soon as possible.  Feel free to call the clinic should you have any questions or concerns. The clinic phone number is [phone].  Please show the [place] at check-in to the Emergency Department and triage nurse.

## 2020-10-04 ENCOUNTER — Other Ambulatory Visit: Payer: Self-pay

## 2020-10-04 ENCOUNTER — Ambulatory Visit: Payer: BC Managed Care – PPO

## 2020-10-04 ENCOUNTER — Inpatient Hospital Stay: Payer: BC Managed Care – PPO

## 2020-10-04 VITALS — BP 112/76 | HR 89 | Temp 97.0°F | Resp 20

## 2020-10-04 DIAGNOSIS — K219 Gastro-esophageal reflux disease without esophagitis: Secondary | ICD-10-CM | POA: Diagnosis not present

## 2020-10-04 DIAGNOSIS — R5383 Other fatigue: Secondary | ICD-10-CM | POA: Diagnosis not present

## 2020-10-04 DIAGNOSIS — E7800 Pure hypercholesterolemia, unspecified: Secondary | ICD-10-CM | POA: Diagnosis not present

## 2020-10-04 DIAGNOSIS — E119 Type 2 diabetes mellitus without complications: Secondary | ICD-10-CM | POA: Diagnosis not present

## 2020-10-04 DIAGNOSIS — C3411 Malignant neoplasm of upper lobe, right bronchus or lung: Secondary | ICD-10-CM

## 2020-10-04 DIAGNOSIS — Z7982 Long term (current) use of aspirin: Secondary | ICD-10-CM | POA: Diagnosis not present

## 2020-10-04 DIAGNOSIS — K5792 Diverticulitis of intestine, part unspecified, without perforation or abscess without bleeding: Secondary | ICD-10-CM | POA: Diagnosis not present

## 2020-10-04 DIAGNOSIS — Z791 Long term (current) use of non-steroidal anti-inflammatories (NSAID): Secondary | ICD-10-CM | POA: Diagnosis not present

## 2020-10-04 DIAGNOSIS — Z5111 Encounter for antineoplastic chemotherapy: Secondary | ICD-10-CM | POA: Diagnosis not present

## 2020-10-04 DIAGNOSIS — Z923 Personal history of irradiation: Secondary | ICD-10-CM | POA: Diagnosis not present

## 2020-10-04 DIAGNOSIS — Z79899 Other long term (current) drug therapy: Secondary | ICD-10-CM | POA: Diagnosis not present

## 2020-10-04 DIAGNOSIS — Z7984 Long term (current) use of oral hypoglycemic drugs: Secondary | ICD-10-CM | POA: Diagnosis not present

## 2020-10-04 DIAGNOSIS — Z5189 Encounter for other specified aftercare: Secondary | ICD-10-CM | POA: Diagnosis not present

## 2020-10-04 MED ORDER — PEGFILGRASTIM-CBQV 6 MG/0.6ML ~~LOC~~ SOSY
6.0000 mg | PREFILLED_SYRINGE | Freq: Once | SUBCUTANEOUS | Status: AC
  Administered 2020-10-04: 6 mg via SUBCUTANEOUS

## 2020-10-04 NOTE — Patient Instructions (Signed)

## 2020-10-06 DIAGNOSIS — C3491 Malignant neoplasm of unspecified part of right bronchus or lung: Secondary | ICD-10-CM | POA: Diagnosis not present

## 2020-10-08 ENCOUNTER — Telehealth: Payer: Self-pay

## 2020-10-08 ENCOUNTER — Inpatient Hospital Stay: Payer: BC Managed Care – PPO

## 2020-10-08 ENCOUNTER — Encounter: Payer: Self-pay | Admitting: Internal Medicine

## 2020-10-08 ENCOUNTER — Inpatient Hospital Stay: Payer: BC Managed Care – PPO | Admitting: Internal Medicine

## 2020-10-08 ENCOUNTER — Other Ambulatory Visit: Payer: Self-pay

## 2020-10-08 VITALS — BP 127/80 | HR 103 | Temp 97.7°F | Resp 16 | Ht 72.05 in | Wt 247.9 lb

## 2020-10-08 DIAGNOSIS — C3490 Malignant neoplasm of unspecified part of unspecified bronchus or lung: Secondary | ICD-10-CM | POA: Diagnosis not present

## 2020-10-08 DIAGNOSIS — E119 Type 2 diabetes mellitus without complications: Secondary | ICD-10-CM | POA: Diagnosis not present

## 2020-10-08 DIAGNOSIS — Z7984 Long term (current) use of oral hypoglycemic drugs: Secondary | ICD-10-CM | POA: Diagnosis not present

## 2020-10-08 DIAGNOSIS — Z7982 Long term (current) use of aspirin: Secondary | ICD-10-CM | POA: Diagnosis not present

## 2020-10-08 DIAGNOSIS — Z5112 Encounter for antineoplastic immunotherapy: Secondary | ICD-10-CM | POA: Diagnosis not present

## 2020-10-08 DIAGNOSIS — K5792 Diverticulitis of intestine, part unspecified, without perforation or abscess without bleeding: Secondary | ICD-10-CM | POA: Diagnosis not present

## 2020-10-08 DIAGNOSIS — Z5111 Encounter for antineoplastic chemotherapy: Secondary | ICD-10-CM | POA: Diagnosis not present

## 2020-10-08 DIAGNOSIS — Z79899 Other long term (current) drug therapy: Secondary | ICD-10-CM | POA: Diagnosis not present

## 2020-10-08 DIAGNOSIS — R5383 Other fatigue: Secondary | ICD-10-CM | POA: Diagnosis not present

## 2020-10-08 DIAGNOSIS — Z5189 Encounter for other specified aftercare: Secondary | ICD-10-CM | POA: Diagnosis not present

## 2020-10-08 DIAGNOSIS — C3411 Malignant neoplasm of upper lobe, right bronchus or lung: Secondary | ICD-10-CM

## 2020-10-08 DIAGNOSIS — Z923 Personal history of irradiation: Secondary | ICD-10-CM | POA: Diagnosis not present

## 2020-10-08 DIAGNOSIS — K219 Gastro-esophageal reflux disease without esophagitis: Secondary | ICD-10-CM | POA: Diagnosis not present

## 2020-10-08 DIAGNOSIS — Z791 Long term (current) use of non-steroidal anti-inflammatories (NSAID): Secondary | ICD-10-CM | POA: Diagnosis not present

## 2020-10-08 DIAGNOSIS — E7800 Pure hypercholesterolemia, unspecified: Secondary | ICD-10-CM | POA: Diagnosis not present

## 2020-10-08 LAB — CMP (CANCER CENTER ONLY)
ALT: 30 U/L (ref 0–44)
AST: 20 U/L (ref 15–41)
Albumin: 3.5 g/dL (ref 3.5–5.0)
Alkaline Phosphatase: 189 U/L — ABNORMAL HIGH (ref 38–126)
Anion gap: 9 (ref 5–15)
BUN: 19 mg/dL (ref 6–20)
CO2: 22 mmol/L (ref 22–32)
Calcium: 9.4 mg/dL (ref 8.9–10.3)
Chloride: 103 mmol/L (ref 98–111)
Creatinine: 1.17 mg/dL (ref 0.61–1.24)
GFR, Estimated: >60 mL/min (ref >60)
Glucose, Bld: 309 mg/dL — ABNORMAL HIGH (ref 70–99)
Potassium: 4.3 mmol/L (ref 3.5–5.1)
Sodium: 134 mmol/L — ABNORMAL LOW (ref 135–145)
Total Bilirubin: 0.3 mg/dL (ref 0.3–1.2)
Total Protein: 7.3 g/dL (ref 6.5–8.1)

## 2020-10-08 LAB — CBC WITH DIFFERENTIAL (CANCER CENTER ONLY)
Abs Immature Granulocytes: 0.15 10*3/uL — ABNORMAL HIGH (ref 0.00–0.07)
Basophils Absolute: 0.1 10*3/uL (ref 0.0–0.1)
Basophils Relative: 1 %
Eosinophils Absolute: 0.4 10*3/uL (ref 0.0–0.5)
Eosinophils Relative: 3 %
HCT: 36.5 % — ABNORMAL LOW (ref 39.0–52.0)
Hemoglobin: 12 g/dL — ABNORMAL LOW (ref 13.0–17.0)
Immature Granulocytes: 1 %
Lymphocytes Relative: 8 %
Lymphs Abs: 1.1 10*3/uL (ref 0.7–4.0)
MCH: 27.1 pg (ref 26.0–34.0)
MCHC: 32.9 g/dL (ref 30.0–36.0)
MCV: 82.4 fL (ref 80.0–100.0)
Monocytes Absolute: 1.4 10*3/uL — ABNORMAL HIGH (ref 0.1–1.0)
Monocytes Relative: 9 %
Neutro Abs: 11.5 10*3/uL — ABNORMAL HIGH (ref 1.7–7.7)
Neutrophils Relative %: 78 %
Platelet Count: 198 10*3/uL (ref 150–400)
RBC: 4.43 MIL/uL (ref 4.22–5.81)
RDW: 14 % (ref 11.5–15.5)
WBC Count: 14.7 10*3/uL — ABNORMAL HIGH (ref 4.0–10.5)
nRBC: 0 % (ref 0.0–0.2)

## 2020-10-08 MED ORDER — OXYCODONE-ACETAMINOPHEN 5-325 MG PO TABS
1.0000 | ORAL_TABLET | Freq: Three times a day (TID) | ORAL | 0 refills | Status: DC | PRN
Start: 2020-10-08 — End: 2020-12-04

## 2020-10-08 NOTE — Telephone Encounter (Signed)
I spoke with pt and advised as indicated. Pt shared his BS have been running high since he started chemo but he will continue to monitor them and relay this information to his PCP as well.

## 2020-10-08 NOTE — Telephone Encounter (Signed)
-----   Message from [employer], PA-C sent at 10/08/2020  2:59 PM EST ----- Regarding: FW: Can you call him to tell him to monitor his Blood sugar at home closely. ----- Message ----- From: Interface, Lab In [place] Sent: 10/08/2020   8:44 AM EST To: Cassandra L Heilingoetter, PA-C

## 2020-10-08 NOTE — Progress Notes (Signed)
[facility] [phone]   [phone]  OFFICE PROGRESS NOTE  [name], DO [place] [address] [place] [address]  DIAGNOSIS: Recurrent/progressive non-small cell lung cancer initially diagnosed as stage IIIA(T2a, N2, M0) non-small cell lung cancer, squamous cell carcinoma diagnosed in April 2021, presented with right upper lobe/suprahilar lung mass in addition to right paratracheal and subcarinal lymphadenopathy.  PRIOR THERAPY:  1) Concurrent chemoradiation with weekly carboplatin for AUC of 2 and paclitaxel 45 NG/M2. First dose on Mar 31, 2020. Status post 7 cycles. Last dose was given on 05/12/2020 with partial response. 2) Consolidation immunotherapy with durvalumab 1500 mg IV every 4 weeks. First dose May 12, 2020.Status post 3 cycles.  CURRENT THERAPY: Systemic chemotherapy with carboplatin for an AUC of 5, Taxol 175 mg/m2, and Keytruda 200 mg IV every 3 weeks. First dose expected on 10/02/20.   Status post 1 cycle.  INTERVAL HISTORY: William Farrell 59 y.o. male returns to the clinic today for follow-up visit accompanied by his wife.  The patient is feeling fine today with no concerning complaints except for fatigue.  He has a lot of pain and aching all over his body after the Neulasta injection.  He has been using Claritin with no improvement in his symptoms.  The patient denied having any current chest pain, shortness of breath, cough or hemoptysis.  He denied having any fever or chills.  He has no nausea, vomiting, diarrhea or constipation.  He has no headache or visual changes.  He was seen recently by Dr. Carlis Abbott at [facility] and he recommended for him to continue with the same treatment but we will send either blood test or tissue for molecular studies.   MEDICAL HISTORY: Past Medical History:  Diagnosis Date  . Asthma    as a child  . Diabetes ([place])   . Diverticulitis   . GERD  (gastroesophageal reflux disease)   . High cholesterol   . History of kidney stones     ALLERGIES:  is allergic to sitagliptin.  MEDICATIONS:  Current Outpatient Medications  Medication Sig Dispense Refill  . acetaminophen (TYLENOL) 500 MG tablet Take 1,000 mg by mouth every 6 (six) hours as needed for moderate pain or headache.    . albuterol (VENTOLIN HFA) 108 (90 Base) MCG/ACT inhaler Inhale 2 puffs into the lungs every 6 (six) hours as needed for wheezing or shortness of breath.     Marland Kitchen aspirin EC 81 MG tablet Take 81 mg by mouth daily.    Marland Kitchen atorvastatin (LIPITOR) 40 MG tablet TAKE 1 TABLET DAILY 90 tablet 1  . calcium carbonate (TUMS - DOSED IN MG ELEMENTAL CALCIUM) 500 MG chewable tablet Chew 2 tablets by mouth daily as needed for indigestion or heartburn.    . doxycycline (VIBRA-TABS) 100 MG tablet Take 1 tablet (100 mg total) by mouth 2 (two) times daily. 14 tablet 0  . glipiZIDE (GLUCOTROL) 5 MG tablet Take 1 tablet (5 mg total) by mouth 2 (two) times daily before a meal. 180 tablet 3  . glucose blood test strip Use as instructed up to qid 100 each 99  . meloxicam (MOBIC) 15 MG tablet TAKE 1 TABLET DAILY 90 tablet 3  . metFORMIN (GLUCOPHAGE) 1000 MG tablet TAKE 1 TABLET TWICE A DAY WITH MEALS 180 tablet 3  . OneTouch Delica Lancets 79G MISC As directed up to qid 100 each 99  . prochlorperazine (COMPAZINE) 10 MG tablet Take 1  tablet (10 mg total) by mouth every 6 (six) hours as needed for nausea or vomiting. 30 tablet 2  . sucralfate (CARAFATE) 1 g tablet Take 1 tablet (1 g total) by mouth 4 (four) times daily -  with meals and at bedtime. Crush 1 tablet in 1 oz water and drink 5 min before meals for radiation induced esophagitis (Patient not taking: Reported on 09/10/2020) 120 tablet 2  . trolamine salicylate (ASPERCREME) 10 % cream Apply 1 application topically as needed for muscle pain.     No current facility-administered medications for this visit.    SURGICAL HISTORY:  Past  Surgical History:  Procedure Laterality Date  . broken bone repair    . BRONCHIAL BIOPSY  03/11/2020   Procedure: BRONCHIAL BIOPSIES;  Surgeon: Garner Nash, DO;  Location: [facility];  Service: Pulmonary;;  . COLONOSCOPY    . CRYOTHERAPY  03/11/2020   Procedure: CRYOTHERAPY;  Surgeon: Garner Nash, DO;  Location: [facility];  Service: Pulmonary;;  . FINE NEEDLE ASPIRATION  03/11/2020   Procedure: FINE NEEDLE ASPIRATION (FNA) LINEAR;  Surgeon: Garner Nash, DO;  Location: [facility];  Service: Pulmonary;;  . KNEE ARTHROSCOPY WITH ANTERIOR CRUCIATE LIGAMENT (ACL) REPAIR     x 2  . VIDEO BRONCHOSCOPY WITH ENDOBRONCHIAL ULTRASOUND N/A 03/11/2020   Procedure: VIDEO BRONCHOSCOPY WITH ENDOBRONCHIAL ULTRASOUND;  Surgeon: Garner Nash, DO;  Location: [facility];  Service: Pulmonary;  Laterality: N/A;    REVIEW OF SYSTEMS:  Constitutional: positive for fatigue Eyes: negative Ears, nose, mouth, throat, and face: negative Respiratory: negative Cardiovascular: negative Gastrointestinal: negative Genitourinary:negative Integument/breast: negative Hematologic/lymphatic: negative Musculoskeletal:positive for arthralgias Neurological: negative Behavioral/Psych: negative Endocrine: negative Allergic/Immunologic: negative   PHYSICAL EXAMINATION: General appearance: alert, cooperative, fatigued and no distress Head: Normocephalic, without obvious abnormality, atraumatic Neck: no adenopathy, no JVD, supple, symmetrical, trachea midline and thyroid not enlarged, symmetric, no tenderness/mass/nodules Lymph nodes: Cervical, supraclavicular, and axillary nodes normal. Resp: clear to auscultation bilaterally Back: symmetric, no curvature. ROM normal. No CVA tenderness. Cardio: regular rate and rhythm, S1, S2 normal, no murmur, click, rub or gallop GI: soft, non-tender; bowel sounds normal; no masses,  no organomegaly Extremities: extremities normal, atraumatic, no cyanosis or  edema Neurologic: Alert and oriented X 3, normal strength and tone. Normal symmetric reflexes. Normal coordination and gait  ECOG PERFORMANCE STATUS: 1 - Symptomatic but completely ambulatory  Blood pressure 127/80, pulse (!) 103, temperature 97.7 F (36.5 C), temperature source Tympanic, resp. rate 16, height 6' 0.05" (1.83 m), weight 247 lb 14.4 oz (112.4 kg), SpO2 98 %.  LABORATORY DATA: Lab Results  Component Value Date   WBC 14.7 (H) 10/08/2020   HGB 12.0 (L) 10/08/2020   HCT 36.5 (L) 10/08/2020   MCV 82.4 10/08/2020   PLT 198 10/08/2020      Chemistry      Component Value Date/Time   NA 136 10/02/2020 0739   K 4.2 10/02/2020 0739   CL 105 10/02/2020 0739   CO2 21 (L) 10/02/2020 0739   BUN 16 10/02/2020 0739   CREATININE 1.31 (H) 10/02/2020 0739   CREATININE 1.10 01/14/2020 0859      Component Value Date/Time   CALCIUM 9.2 10/02/2020 0739   ALKPHOS 123 10/02/2020 0739   AST 15 10/02/2020 0739   ALT 20 10/02/2020 0739   BILITOT 0.3 10/02/2020 0739       RADIOGRAPHIC STUDIES: NM PET Image Restag (PS) Skull Base To Thigh  Result Date: 09/16/2020 CLINICAL DATA:  Subsequent treatment strategy  for non-small cell lung cancer. EXAM: NUCLEAR MEDICINE PET SKULL BASE TO THIGH TECHNIQUE: 12.4 mCi F-18 FDG was injected intravenously. Full-ring PET imaging was performed from the skull base to thigh after the radiotracer. CT data was obtained and used for attenuation correction and anatomic localization. Fasting blood glucose: 140 mg/dl COMPARISON:  03/14/2020 FINDINGS: Mediastinal blood pool activity: SUV max 3.21 Liver activity: SUV max NA NECK: New right level Vb lymph node measures 0.6 cm short axis with SUV max of 8.7, image 39/4. New right level III lymph node measures 1 cm and has an SUV max of 7.26, image 42/4. Right level IV lymph node measures 1.4 cm within SUV max of 6.07. Previously this measured 1.3 cm with SUV max of 10.7. Incidental CT findings: none CHEST: New FDG avid  right retropectoral lymph node measures 1 cm with SUV max of 11.24, image 51/4. Right paratracheal node measures 0.8 cm within SUV max of 4.27. Previously 1.6 cm with SUV max of 9.12. Lower right paratracheal node measures 1 cm with SUV max of 3.3, image 72/4. Previously this measured 1.6 cm with SUV max of 9.13. Subcarinal lymph node measures 0.7 cm with SUV max of 3.76, image 76/4. Previously 1.6 cm with SUV max of 11.7. The right upper lobe perihilar lesion measures 2.2 cm with SUV max of 10.3, image 71/4. Previously this measured 3.7 cm with SUV max of 11.9. Within the right lung apex there are 2 adjacent, enlarging nodules which are FDG avid. The larger, more anterior nodule measures 1.3 cm and has an SUV max of 10.98, image 60/4. Previously this measured 4 mm. The smaller, more posterior nodule measures 6 mm with SUV max of 8.06, image 59/4. Previously this nodule measured 4 mm. Incidental CT findings: Bandlike area of ground-glass attenuation, airspace consolidation and architectural distortion noted within the superior segment of right lower lobe and right upper lobe. There is corresponding increased FDG uptake within this area. Findings compatible with changes secondary to external beam radiation. ABDOMEN/PELVIS: No abnormal tracer uptake identified within the liver, spleen, adrenal glands, or pancreas. No FDG avid abdominopelvic lymph nodes. Incidental CT findings: Aortic atherosclerosis. No aneurysm. Distal colonic diverticulosis noted without acute inflammation. SKELETON: No focal hypermetabolic activity to suggest skeletal metastasis. Incidental CT findings: none IMPRESSION: 1. Interval mixed response to therapy. 2. There has been decrease in size and degree of FDG uptake associated with the right upper lobe perihilar lung mass. There has also been interval decrease in size and FDG uptake associated with the right paratracheal and subcarinal lymph nodes. 3. New FDG avid right level VB and level III  cervical lymph nodes. There is also new FDG avid right retropectoral lymph node. 4. There are 2 enlarging FDG avid lymph nodes pulmonary nodules within the right lung apex. Electronically Signed   By: Kerby Moors M.D.   On: 09/16/2020 10:14    ASSESSMENT AND PLAN: This is a 59 years old white male was recently diagnosed with stage IIIa non-small cell lung cancer, squamous cell carcinoma in April 2021 presented with right upper lobe/suprahilar lung mass in addition to right paratracheal and subcarinal lymphadenopathy. The patient completed a course of concurrent chemoradiation with weekly carboplatin for AUC of 2 and paclitaxel 45 MG/M2.  He is status post 7 cycles.  He tolerated the previous course of his treatment well except for mild dysphagia and odynophagia. He underwent consolidation treatment with immunotherapy with Imfinzi 1500 mg IV every 4 weeks.  Status post 3 cycles.  The  patient tolerated the treatment well but unfortunately he has evidence for disease progression after cycle #3. We started the patient on first-line treatment with chemotherapy with carboplatin for AUC of 5, paclitaxel 175 mg/M2 and Keytruda 200 mg IV every 3 weeks.  He started the first cycle of this treatment last week and tolerated it well except for the aching pain and arthralgia from the Neulasta injection. I recommended for the patient to continue his treatment as planned and he is expected to start cycle #2 in 2 weeks. For the arthralgia and pain, I will give the patient prescription for Percocet to be used for the aching pain after the Neulasta injection. He will come back for follow-up visit in 2 weeks for evaluation before the next cycle of his treatment. We will send the blood test to [facility] for molecular studies. The patient was advised to call immediately if he has any other concerning symptoms in the interval.  The patient voices understanding of current disease status and treatment options and is in  agreement with the current care plan. All questions were answered. The patient knows to call the clinic with any problems, questions or concerns. We can certainly see the patient much sooner if necessary.  Disclaimer: This note was dictated with voice recognition software. Similar sounding words can inadvertently be transcribed and may not be corrected upon review.

## 2020-10-13 ENCOUNTER — Other Ambulatory Visit: Payer: Self-pay | Admitting: Osteopathic Medicine

## 2020-10-15 ENCOUNTER — Other Ambulatory Visit (HOSPITAL_COMMUNITY): Payer: Self-pay | Admitting: Physician Assistant

## 2020-10-15 ENCOUNTER — Telehealth: Payer: Self-pay

## 2020-10-15 ENCOUNTER — Encounter: Payer: Self-pay | Admitting: Osteopathic Medicine

## 2020-10-15 NOTE — Telephone Encounter (Signed)
Pt's wife called stating that pt's sugar continues to be elevated with his chemo treatment. B/S ranging between 200 - 300's. Currently taking metformin 1000 mg and glipizide 10 mg. Requesting recommendations from provider. Pt did inform his oncologist of his current b/s readings and was advise to notify his provider.

## 2020-10-15 NOTE — Telephone Encounter (Signed)
Task completed. Pt and his wife has been advised of provider's recommendation. Per pt, he is fasting during his b/s checks. He is going to hold off on adding an insulin medication. Pt will keep [place] posted of his b/s levels if above 400. No other inquiries during the call.

## 2020-10-15 NOTE — Telephone Encounter (Signed)
Need to know if these are fasting or non-fasting. If fating, I'm a bit more concerned. I'm not at all worried if these are non-fasting numbers. As previously discussed, I am not worried about temporary increases as long as number are less then 400. Only other option would be to add insulin.

## 2020-10-16 ENCOUNTER — Ambulatory Visit (HOSPITAL_COMMUNITY)
Admission: RE | Admit: 2020-10-16 | Discharge: 2020-10-16 | Disposition: A | Discharge status: Home / Self Care | Payer: BC Managed Care – PPO | Source: Ambulatory Visit | Attending: Internal Medicine | Admitting: Internal Medicine

## 2020-10-16 ENCOUNTER — Encounter (HOSPITAL_COMMUNITY): Payer: Self-pay

## 2020-10-16 ENCOUNTER — Other Ambulatory Visit: Payer: Self-pay

## 2020-10-16 DIAGNOSIS — Z87891 Personal history of nicotine dependence: Secondary | ICD-10-CM | POA: Insufficient documentation

## 2020-10-16 DIAGNOSIS — Z79899 Other long term (current) drug therapy: Secondary | ICD-10-CM | POA: Insufficient documentation

## 2020-10-16 DIAGNOSIS — I10 Essential (primary) hypertension: Secondary | ICD-10-CM | POA: Diagnosis not present

## 2020-10-16 DIAGNOSIS — Z7982 Long term (current) use of aspirin: Secondary | ICD-10-CM | POA: Diagnosis not present

## 2020-10-16 DIAGNOSIS — R590 Localized enlarged lymph nodes: Secondary | ICD-10-CM | POA: Diagnosis not present

## 2020-10-16 DIAGNOSIS — K219 Gastro-esophageal reflux disease without esophagitis: Secondary | ICD-10-CM | POA: Insufficient documentation

## 2020-10-16 DIAGNOSIS — C3491 Malignant neoplasm of unspecified part of right bronchus or lung: Secondary | ICD-10-CM | POA: Insufficient documentation

## 2020-10-16 DIAGNOSIS — Z452 Encounter for adjustment and management of vascular access device: Secondary | ICD-10-CM | POA: Diagnosis not present

## 2020-10-16 DIAGNOSIS — I878 Other specified disorders of veins: Secondary | ICD-10-CM

## 2020-10-16 DIAGNOSIS — C3490 Malignant neoplasm of unspecified part of unspecified bronchus or lung: Secondary | ICD-10-CM | POA: Diagnosis not present

## 2020-10-16 HISTORY — PX: IR IMAGING GUIDED PORT INSERTION: IMG5740

## 2020-10-16 LAB — GLUCOSE, CAPILLARY: Glucose-Capillary: 281 mg/dL — ABNORMAL HIGH (ref 70–99)

## 2020-10-16 MED ORDER — HEPARIN SOD (PORK) LOCK FLUSH 100 UNIT/ML IV SOLN
INTRAVENOUS | Status: AC | PRN
  Administered 2020-10-16: 500 [IU] via INTRAVENOUS

## 2020-10-16 MED ORDER — FENTANYL CITRATE (PF) 100 MCG/2ML IJ SOLN
INTRAMUSCULAR | Status: AC
  Filled 2020-10-16: qty 2

## 2020-10-16 MED ORDER — CEFAZOLIN SODIUM-DEXTROSE 2-4 GM/100ML-% IV SOLN
INTRAVENOUS | Status: AC
  Filled 2020-10-16: qty 100

## 2020-10-16 MED ORDER — MIDAZOLAM HCL 2 MG/2ML IJ SOLN
INTRAMUSCULAR | Status: AC | PRN
  Administered 2020-10-16 (×2): 1 mg via INTRAVENOUS

## 2020-10-16 MED ORDER — SODIUM CHLORIDE 0.9 % IV SOLN: INTRAVENOUS | Status: DC

## 2020-10-16 MED ORDER — FENTANYL CITRATE (PF) 100 MCG/2ML IJ SOLN
INTRAMUSCULAR | Status: AC | PRN
Start: 2020-10-16 — End: 2020-10-16
  Administered 2020-10-16: 25 ug via INTRAVENOUS
  Administered 2020-10-16: 50 ug via INTRAVENOUS
  Administered 2020-10-16: 25 ug via INTRAVENOUS

## 2020-10-16 MED ORDER — LIDOCAINE-EPINEPHRINE 1 %-1:100000 IJ SOLN
INTRAMUSCULAR | Status: AC
  Filled 2020-10-16: qty 1

## 2020-10-16 MED ORDER — LIDOCAINE-EPINEPHRINE (PF) 1 %-1:200000 IJ SOLN
INTRAMUSCULAR | Status: AC | PRN
  Administered 2020-10-16: 20 mL

## 2020-10-16 MED ORDER — MIDAZOLAM HCL 2 MG/2ML IJ SOLN
INTRAMUSCULAR | Status: AC
  Filled 2020-10-16: qty 2

## 2020-10-16 MED ORDER — CEFAZOLIN SODIUM-DEXTROSE 2-4 GM/100ML-% IV SOLN
2.0000 g | Freq: Once | INTRAVENOUS | Status: AC
  Administered 2020-10-16: 2 g via INTRAVENOUS

## 2020-10-16 MED ORDER — HEPARIN SOD (PORK) LOCK FLUSH 100 UNIT/ML IV SOLN
INTRAVENOUS | Status: AC
  Filled 2020-10-16: qty 5

## 2020-10-16 NOTE — Sedation Documentation (Signed)
Attempted to call report to short stay, no one was available to take report at this time.

## 2020-10-16 NOTE — H&P (Signed)
Chief Complaint: Patient was seen in consultation today for lung cancer  Referring Physician(s): Mohamed,Mohamed  Supervising Physician: Sandi Mariscal  Patient Status: [facility]  History of Present Illness: William Farrell is a 59 y.o. male with past medical history of DM, diverticulitis, GERD, and lung cancer undergoing chemo and immunotherapy.  Patient has plans for treatment for at least 2 years. He reports recent difficulty with venous access for frequent lab draws and infusions.  He presents to Radiology today for Port-A-Cath placement at the request of Dr. Julien Nordmann. He is in his usual state of health. Denies fever, chills, nausea, vomiting, abdominal pain, diarrhea.  He has been NPO today.   Past Medical History:  Diagnosis Date  . Asthma    as a child  . Diabetes ([place])   . Diverticulitis   . GERD (gastroesophageal reflux disease)   . High cholesterol   . History of kidney stones     Past Surgical History:  Procedure Laterality Date  . broken bone repair    . BRONCHIAL BIOPSY  03/11/2020   Procedure: BRONCHIAL BIOPSIES;  Surgeon: Garner Nash, DO;  Location: [facility];  Service: Pulmonary;;  . COLONOSCOPY    . CRYOTHERAPY  03/11/2020   Procedure: CRYOTHERAPY;  Surgeon: Garner Nash, DO;  Location: [facility];  Service: Pulmonary;;  . FINE NEEDLE ASPIRATION  03/11/2020   Procedure: FINE NEEDLE ASPIRATION (FNA) LINEAR;  Surgeon: Garner Nash, DO;  Location: [facility];  Service: Pulmonary;;  . KNEE ARTHROSCOPY WITH ANTERIOR CRUCIATE LIGAMENT (ACL) REPAIR     x 2  . VIDEO BRONCHOSCOPY WITH ENDOBRONCHIAL ULTRASOUND N/A 03/11/2020   Procedure: VIDEO BRONCHOSCOPY WITH ENDOBRONCHIAL ULTRASOUND;  Surgeon: Garner Nash, DO;  Location: [facility];  Service: Pulmonary;  Laterality: N/A;    Allergies: Sitagliptin  Medications: Prior to Admission medications   Medication Sig Start Date End Date Taking? Authorizing Provider  acetaminophen (TYLENOL)  500 MG tablet Take 1,000 mg by mouth every 6 (six) hours as needed for mild pain.   Yes [provider]  albuterol (VENTOLIN HFA) 108 (90 Base) MCG/ACT inhaler Inhale 2 puffs into the lungs every 6 (six) hours as needed for wheezing or shortness of breath.  11/02/18  Yes [provider]  aspirin EC 81 MG tablet Take 81 mg by mouth at bedtime.    Yes [provider]  atorvastatin (LIPITOR) 40 MG tablet TAKE 1 TABLET DAILY Patient taking differently: Take 40 mg by mouth at bedtime.  09/01/20  Yes [name], DO  calcium carbonate (TUMS - DOSED IN MG ELEMENTAL CALCIUM) 500 MG chewable tablet Chew 2 tablets by mouth 3 (three) times daily as needed for indigestion or heartburn.    Yes [provider]  docusate sodium (COLACE) 100 MG capsule Take 100 mg by mouth daily.   Yes [provider]  glipiZIDE (GLUCOTROL) 5 MG tablet Take 1 tablet (5 mg total) by mouth 2 (two) times daily before a meal. 08/13/20  Yes Luetta Nutting, DO  meloxicam (MOBIC) 15 MG tablet TAKE 1 TABLET DAILY Patient taking differently: Take 15 mg by mouth daily.  08/04/20  Yes [name], DO  metFORMIN (GLUCOPHAGE) 1000 MG tablet TAKE 1 TABLET TWICE A DAY WITH MEALS Patient taking differently: Take 500 mg by mouth 2 (two) times daily with a meal.  06/16/20  Yes [name], DO  oxyCODONE-acetaminophen (PERCOCET/ROXICET) 5-325 MG tablet Take 1 tablet by mouth every 8 (eight) hours as needed for severe pain. 10/08/20  Yes Curt Bears, MD  prochlorperazine (COMPAZINE) 10 MG tablet Take 1 tablet (10 mg total) by mouth every 6 (six) hours as needed for nausea or vomiting. 09/23/20  Yes Heilingoetter, Cassandra L, PA-C  trolamine salicylate (ASPERCREME) 10 % cream Apply 1 application topically 2 (two) times daily as needed for muscle pain.    Yes [provider]  doxycycline (VIBRA-TABS) 100 MG tablet Take 1 tablet (100 mg total) by mouth 2 (two) times daily. Patient  not taking: Reported on 10/10/2020 09/04/20   Heilingoetter, Cassandra L, PA-C  OneTouch Delica Lancets 16X MISC As directed up to qid 11/14/19   [name], DO  [facility] ULTRA test strip USE AS INSTRUCTED UP TO FOUR TIMES A DAY 10/14/20   [name], DO     Family History  Problem Relation Age of Onset  . High blood pressure Mother   . Stroke Mother     Social History   Socioeconomic History  . Marital status: Married    Spouse name: Not on file  . Number of children: Not on file  . Years of education: Not on file  . Highest education level: Not on file  Occupational History  . Occupation: Research officer, trade union: [employer]   Tobacco Use  . Smoking status: Former Smoker    Packs/day: 2.00    Years: 43.00    Pack years: 86.00    Types: Cigarettes    Quit date: 04/15/2020    Years since quitting: 0.5  . Smokeless tobacco: Never Used  Vaping Use  . Vaping Use: Never used  Substance and Sexual Activity  . Alcohol use: Not Currently  . Drug use: Not Currently  . Sexual activity: Yes    Partners: Female    Birth control/protection: None  Other Topics Concern  . Not on file  Social History Narrative  . Not on file   Social Determinants of Health   Financial Resource Strain:   . Difficulty of Paying Living Expenses: Not on file  Food Insecurity:   . Worried About Charity fundraiser in the Last Year: Not on file  . Ran Out of Food in the Last Year: Not on file  Transportation Needs:   . Lack of Transportation (Medical): Not on file  . Lack of Transportation (Non-Medical): Not on file  Physical Activity:   . Days of Exercise per Week: Not on file  . Minutes of Exercise per Session: Not on file  Stress:   . Feeling of Stress : Not on file  Social Connections:   . Frequency of Communication with Friends and Family: Not on file  . Frequency of Social Gatherings with Friends and Family: Not on file  . Attends Religious Services: Not on file  .  Active Member of Clubs or Organizations: Not on file  . Attends Archivist Meetings: Not on file  . Marital Status: Not on file     Review of Systems: A 12 point ROS discussed and pertinent positives are indicated in the HPI above.  All other systems are negative.  Review of Systems  Constitutional: Negative for fatigue and fever.  Respiratory: Negative for cough and shortness of breath.   Cardiovascular: Negative for chest pain.  Gastrointestinal: Negative for abdominal pain, diarrhea and nausea.  Genitourinary: Negative for dysuria.  Musculoskeletal: Negative for back pain.  Psychiatric/Behavioral: Negative for behavioral problems and confusion.    Vital Signs: BP 118/82   Pulse (!) 102   Temp  98.1 F (36.7 C) (Oral)   Resp 18   Ht 6' (1.829 m)   Wt 248 lb (112.5 kg)   SpO2 98%   BMI 33.63 kg/m   Physical Exam Vitals and nursing note reviewed.  Constitutional:      General: He is not in acute distress.    Appearance: Normal appearance. He is not ill-appearing.  HENT:     Mouth/Throat:     Mouth: Mucous membranes are moist.     Pharynx: Oropharynx is clear.  Cardiovascular:     Rate and Rhythm: Normal rate and regular rhythm.  Pulmonary:     Effort: Pulmonary effort is normal. No respiratory distress.     Breath sounds: Normal breath sounds.  Abdominal:     General: Abdomen is flat.     Palpations: Abdomen is soft.  Musculoskeletal:     Cervical back: Normal range of motion and neck supple.  Skin:    General: Skin is warm and dry.  Neurological:     General: No focal deficit present.     Mental Status: He is alert and oriented to person, place, and time. Mental status is at baseline.  Psychiatric:        Mood and Affect: Mood normal.        Behavior: Behavior normal.        Thought Content: Thought content normal.        Judgment: Judgment normal.      MD Evaluation Airway: WNL Heart: WNL Abdomen: WNL Chest/ Lungs: WNL ASA   Classification: 3 Mallampati/Airway Score: Two   Imaging: NM PET Image Restag (PS) Skull Base To Thigh  Result Date: 09/16/2020 CLINICAL DATA:  Subsequent treatment strategy for non-small cell lung cancer. EXAM: NUCLEAR MEDICINE PET SKULL BASE TO THIGH TECHNIQUE: 12.4 mCi F-18 FDG was injected intravenously. Full-ring PET imaging was performed from the skull base to thigh after the radiotracer. CT data was obtained and used for attenuation correction and anatomic localization. Fasting blood glucose: 140 mg/dl COMPARISON:  03/14/2020 FINDINGS: Mediastinal blood pool activity: SUV max 3.21 Liver activity: SUV max NA NECK: New right level Vb lymph node measures 0.6 cm short axis with SUV max of 8.7, image 39/4. New right level III lymph node measures 1 cm and has an SUV max of 7.26, image 42/4. Right level IV lymph node measures 1.4 cm within SUV max of 6.07. Previously this measured 1.3 cm with SUV max of 10.7. Incidental CT findings: none CHEST: New FDG avid right retropectoral lymph node measures 1 cm with SUV max of 11.24, image 51/4. Right paratracheal node measures 0.8 cm within SUV max of 4.27. Previously 1.6 cm with SUV max of 9.12. Lower right paratracheal node measures 1 cm with SUV max of 3.3, image 72/4. Previously this measured 1.6 cm with SUV max of 9.13. Subcarinal lymph node measures 0.7 cm with SUV max of 3.76, image 76/4. Previously 1.6 cm with SUV max of 11.7. The right upper lobe perihilar lesion measures 2.2 cm with SUV max of 10.3, image 71/4. Previously this measured 3.7 cm with SUV max of 11.9. Within the right lung apex there are 2 adjacent, enlarging nodules which are FDG avid. The larger, more anterior nodule measures 1.3 cm and has an SUV max of 10.98, image 60/4. Previously this measured 4 mm. The smaller, more posterior nodule measures 6 mm with SUV max of 8.06, image 59/4. Previously this nodule measured 4 mm. Incidental CT findings: Bandlike area of ground-glass attenuation,  airspace consolidation and architectural distortion noted within the superior segment of right lower lobe and right upper lobe. There is corresponding increased FDG uptake within this area. Findings compatible with changes secondary to external beam radiation. ABDOMEN/PELVIS: No abnormal tracer uptake identified within the liver, spleen, adrenal glands, or pancreas. No FDG avid abdominopelvic lymph nodes. Incidental CT findings: Aortic atherosclerosis. No aneurysm. Distal colonic diverticulosis noted without acute inflammation. SKELETON: No focal hypermetabolic activity to suggest skeletal metastasis. Incidental CT findings: none IMPRESSION: 1. Interval mixed response to therapy. 2. There has been decrease in size and degree of FDG uptake associated with the right upper lobe perihilar lung mass. There has also been interval decrease in size and FDG uptake associated with the right paratracheal and subcarinal lymph nodes. 3. New FDG avid right level VB and level III cervical lymph nodes. There is also new FDG avid right retropectoral lymph node. 4. There are 2 enlarging FDG avid lymph nodes pulmonary nodules within the right lung apex. Electronically Signed   By: Kerby Moors M.D.   On: 09/16/2020 10:14    Labs:  CBC: Recent Labs    08/07/20 0748 09/04/20 0746 10/02/20 0739 10/08/20 0825  WBC 6.4 7.2 7.4 14.7*  HGB 11.4* 11.2* 11.2* 12.0*  HCT 33.1* 33.4* 34.2* 36.5*  PLT 183 209 229 198    COAGS: Recent Labs    03/11/20 1045  INR 1.1  APTT 39*    BMP: Recent Labs    06/02/20 1019 06/02/20 1019 06/13/20 0852 06/13/20 0852 07/10/20 0756 07/10/20 0756 08/07/20 0748 09/04/20 0746 10/02/20 0739 10/08/20 0825  NA 141   < > 138   < > 136   < > 131* 139 136 134*  K 4.2   < > 4.3   < > 4.3   < > 4.1 4.3 4.2 4.3  CL 110   < > 109   < > 107   < > 103 107 105 103  CO2 23   < > 22   < > 22   < > 24 22 21* 22  GLUCOSE 173*   < > 218*   < > 200*   < > 370* 200* 250* 309*  BUN 17   < >  17   < > 16   < > 18 15 16 19   CALCIUM 9.4   < > 10.0   < > 10.2   < > 9.3 9.4 9.2 9.4  CREATININE 1.19   < > 1.25*   < > 1.13   < > 1.21 1.25* 1.31* 1.17  GFRNONAA >60   < > >60   < > >60   < > >60 >60 >60 >60  GFRAA >60  --  >60  --  >60  --  >60  --   --   --    < > = values in this interval not displayed.    LIVER FUNCTION TESTS: Recent Labs    08/07/20 0748 09/04/20 0746 10/02/20 0739 10/08/20 0825  BILITOT 0.4 0.2* 0.3 0.3  AST 13* 18 15 20   ALT 19 26 20 30   ALKPHOS 120 115 123 189*  PROT 6.9 7.0 7.1 7.3  ALBUMIN 3.4* 3.3* 3.4* 3.5    TUMOR MARKERS: No results for input(s): AFPTM, CEA, CA199, CHROMGRNA in the last 8760 hours.  Assessment and Plan: Patient with past medical history of GERD, HTN presents with complaint of lung cancer undergoing chemo and immunotherapy.  IR consulted for Port-A-Cath placement at  the request of Dr. Julien Nordmann. Case reviewed by Dr. Pascal Lux who approves patient for procedure. He does have right-sided disease with right cervical lymphadenopathy. Per Dr. Pascal Lux, plans for left-sided [place].   Patient presents today in their usual state of health.  He has been NPO and is not currently on blood thinners.   Risks and benefits of image guided port-a-catheter placement was discussed with the patient including, but not limited to bleeding, infection, pneumothorax, or fibrin sheath development and need for additional procedures.  All of the patient's questions were answered, patient is agreeable to proceed. Consent signed and in chart.   Thank you for this interesting consult.  I greatly enjoyed meeting William Farrell and look forward to participating in their care.  A copy of this report was sent to the requesting provider on this date.  Electronically Signed: Docia Barrier, PA 10/16/2020, 9:02 AM   I spent a total of  30 Minutes   in face to face in clinical consultation, greater than 50% of which was counseling/coordinating care for lung  cancer.

## 2020-10-16 NOTE — Procedures (Signed)
Pre Procedure Dx: Poor venous accss Post Procedural Dx: Same  Successful placement of left IJ approach port-a-cath with tip at the superior caval atrial junction. The catheter is ready for immediate use.  Estimated Blood Loss: Minimal Complications: None immediate.  Ronny Bacon, MD Pager #: [phone]

## 2020-10-16 NOTE — Sedation Documentation (Signed)
Attempted to call short stay to give report, no one is available for report.

## 2020-10-16 NOTE — Discharge Instructions (Addendum)
Implanted Port Insertion, Care After This sheet gives you information about how to care for yourself after your procedure. Your health care provider may also give you more specific instructions. If you have problems or questions, contact your health care provider. What can I expect after the procedure? After the procedure, it is common to have:  Discomfort at the port insertion site.  Bruising on the skin over the port. This should improve over 3-4 days. Follow these instructions at home: [facility] care  After your port is placed, you will get a manufacturer's information card. The card has information about your port. Keep this card with you at all times.  Take care of the port as told by your health care provider. Ask your health care provider if you or a family member can get training for taking care of the port at home. A home health care nurse may also take care of the port.  Make sure to remember what type of port you have. Incision care      Follow instructions from your health care provider about how to take care of your port insertion site. Make sure you: ? Wash your hands with soap and water before and after you change your bandage (dressing). If soap and water are not available, use hand sanitizer. ? Remove your dressing as told by your health care provider. In 24 hours  Check your port insertion site every day for signs of infection. Check for: ? Redness, swelling, or pain. ? Fluid or blood. ? Warmth. ? Pus or a bad smell. Activity  Return to your normal activities as told by your health care provider. Ask your health care provider what activities are safe for you.  Do not lift anything that is heavier than 10 lb (4.5 kg), or the limit that you are told, until your health care provider says that it is safe. General instructions  Take over-the-counter and prescription medicines only as told by your health care provider.  Do not take baths, swim, or use a hot tub until  your health care provider approves. Ask your health care provider if you may take showers. You may only be allowed to take sponge baths.  Do not drive for 24 hours if you were given a sedative during your procedure.  Wear a medical alert bracelet in case of an emergency. This will tell any health care providers that you have a port.  Keep all follow-up visits as told by your health care provider. This is important. Contact a health care provider if:  You cannot flush your port with saline as directed, or you cannot draw blood from the port.  You have a fever or chills.  You have redness, swelling, or pain around your port insertion site.  You have fluid or blood coming from your port insertion site.  Your port insertion site feels warm to the touch.  You have pus or a bad smell coming from the port insertion site. Get help right away if:  You have chest pain or shortness of breath.  You have bleeding from your port that you cannot control. Summary  Take care of the port as told by your health care provider. Keep the manufacturer's information card with you at all times.  Change your dressing as told by your health care provider.  Contact a health care provider if you have a fever or chills or if you have redness, swelling, or pain around your port insertion site.  Keep all follow-up visits  as told by your health care provider. This information is not intended to replace advice given to you by your health care provider. Make sure you discuss any questions you have with your health care provider. Document Revised: 05/30/2018 Document Reviewed: 05/30/2018 Elsevier Patient Education  [place].  Moderate Conscious Sedation, Adult, Care After These instructions provide you with information about caring for yourself after your procedure. Your health care provider may also give you more specific instructions. Your treatment has been planned according to current medical  practices, but problems sometimes occur. Call your health care provider if you have any problems or questions after your procedure. What can I expect after the procedure? After your procedure, it is common:  To feel sleepy for several hours.  To feel clumsy and have poor balance for several hours.  To have poor judgment for several hours.  To vomit if you eat too soon. Follow these instructions at home: For at least 24 hours after the procedure:   Do not: ? Participate in activities where you could fall or become injured. ? Drive. ? Use heavy machinery. ? Drink alcohol. ? Take sleeping pills or medicines that cause drowsiness. ? Make important decisions or sign legal documents. ? Take care of children on your own.  Rest. Eating and drinking  Follow the diet recommended by your health care provider.  If you vomit: ? Drink water, juice, or soup when you can drink without vomiting. ? Make sure you have little or no nausea before eating solid foods. General instructions  Have a responsible adult stay with you until you are awake and alert.  Take over-the-counter and prescription medicines only as told by your health care provider.  If you smoke, do not smoke without supervision.  Keep all follow-up visits as told by your health care provider. This is important. Contact a health care provider if:  You keep feeling nauseous or you keep vomiting.  You feel light-headed.  You develop a rash.  You have a fever. Get help right away if:  You have trouble breathing. This information is not intended to replace advice given to you by your health care provider. Make sure you discuss any questions you have with your health care provider. Document Revised: 10/14/2017 Document Reviewed: 02/21/2016 Elsevier Patient Education  2020 [employer].

## 2020-10-17 ENCOUNTER — Encounter: Payer: Self-pay | Admitting: Internal Medicine

## 2020-10-17 DIAGNOSIS — C3491 Malignant neoplasm of unspecified part of right bronchus or lung: Secondary | ICD-10-CM | POA: Diagnosis not present

## 2020-10-21 NOTE — Progress Notes (Signed)
[facility] OFFICE PROGRESS NOTE  [name], DO [address] [place] [address]  DIAGNOSIS: Recurrent/progressive non-small cell lung cancer initially diagnosed as stage IIIA(T2a, N2, M0) non-small cell lung cancer, squamous cell carcinoma diagnosed in April 2021, presented with right upper lobe/suprahilar lung mass in addition to right paratracheal and subcarinal lymphadenopathy.  Molecular Studies by [employer]: No actionable mutations  PRIOR THERAPY: 1) Concurrent chemoradiation with weekly carboplatin for AUC of 2 and paclitaxel 45 NG/M2. First dose on Mar 31, 2020. Status post 7 cycles. Last dose was given on 05/12/2020 with partial response. 2) Consolidation immunotherapy with durvalumab 1500 mg IV every 4 weeks. First dose May 12, 2020.Status post 3 cycles.  CURRENT THERAPY: Systemic chemotherapy with carboplatin for an AUC of 5, Taxol 175 mg/m2, and Keytruda 200 mg IV every 3 weeks. First dose expected on 10/02/20. Status post 1 cycle.   INTERVAL HISTORY: William Farrell 59 y.o. male returns to the clinic today for a follow-up visit accompanied by his wife.  The patient is feeling fairly well today without any concerning complaints.  The patient recently had evidence of disease progression and his treatment was subsequently switched to carboplatin, Taxol, and Keytruda.  He is status post 1 cycle and tolerated it fairly well except for fatigue and aching pain secondary to the Neulasta injection.  He had little improvement with Claritin.  Dr. Julien Nordmann gave the patient a prescription for percocet to use for neulasta pain if needed. The patient had molecular studies performed by [employer] which did not show any actionable mutations.  He had a office visit with Dr. Carlis Abbott from [facility] who recommended that the patient continue on the same treatment. They are performing some special genetic testing to see if he is a canidate for a clinical  trial in the future.   Today, the patient denies any fever, chills, or weight loss.  He reports his baseline night sweats.  He denies any nausea, vomiting, diarrhea, or constipation.  He denies any headache or visual changes. Denies changes wih his shortness of breath. He reports an occasional cough which sometimes causes chest soreness but he states the cough is mild and denies coughing fits. He started to lose his hair. The patient denies any rashes or skin changes.  The patient is here today for evaluation before starting cycle #2 treatment.  MEDICAL HISTORY: Past Medical History:  Diagnosis Date  . Asthma    as a child  . Diabetes ([place])   . Diverticulitis   . GERD (gastroesophageal reflux disease)   . High cholesterol   . History of kidney stones     ALLERGIES:  is allergic to sitagliptin.  MEDICATIONS:  Current Outpatient Medications  Medication Sig Dispense Refill  . acetaminophen (TYLENOL) 500 MG tablet Take 1,000 mg by mouth every 6 (six) hours as needed for mild pain.    Marland Kitchen albuterol (VENTOLIN HFA) 108 (90 Base) MCG/ACT inhaler Inhale 2 puffs into the lungs every 6 (six) hours as needed for wheezing or shortness of breath.     Marland Kitchen aspirin EC 81 MG tablet Take 81 mg by mouth at bedtime.     Marland Kitchen atorvastatin (LIPITOR) 40 MG tablet TAKE 1 TABLET DAILY (Patient taking differently: Take 40 mg by mouth at bedtime. ) 90 tablet 1  . calcium carbonate (TUMS - DOSED IN MG ELEMENTAL CALCIUM) 500 MG chewable tablet Chew 2 tablets by mouth 3 (three) times daily as needed for indigestion or heartburn.     Marland Kitchen  docusate sodium (COLACE) 100 MG capsule Take 100 mg by mouth daily.    Marland Kitchen doxycycline (VIBRA-TABS) 100 MG tablet Take 1 tablet (100 mg total) by mouth 2 (two) times daily. (Patient not taking: Reported on 10/10/2020) 14 tablet 0  . glipiZIDE (GLUCOTROL) 5 MG tablet Take 1 tablet (5 mg total) by mouth 2 (two) times daily before a meal. 180 tablet 3  . lidocaine-prilocaine (EMLA) cream Apply 1  application topically as needed. 30 g 2  . meloxicam (MOBIC) 15 MG tablet TAKE 1 TABLET DAILY (Patient taking differently: Take 15 mg by mouth daily. ) 90 tablet 3  . metFORMIN (GLUCOPHAGE) 1000 MG tablet TAKE 1 TABLET TWICE A DAY WITH MEALS (Patient taking differently: Take 500 mg by mouth 2 (two) times daily with a meal. ) 180 tablet 3  . OneTouch Delica Lancets 87O MISC As directed up to qid 100 each 99  . ONETOUCH ULTRA test strip USE AS INSTRUCTED UP TO FOUR TIMES A DAY 100 strip 13  . oxyCODONE-acetaminophen (PERCOCET/ROXICET) 5-325 MG tablet Take 1 tablet by mouth every 8 (eight) hours as needed for severe pain. 30 tablet 0  . prochlorperazine (COMPAZINE) 10 MG tablet Take 1 tablet (10 mg total) by mouth every 6 (six) hours as needed for nausea or vomiting. 30 tablet 2  . trolamine salicylate (ASPERCREME) 10 % cream Apply 1 application topically 2 (two) times daily as needed for muscle pain.      No current facility-administered medications for this visit.    SURGICAL HISTORY:  Past Surgical History:  Procedure Laterality Date  . broken bone repair    . BRONCHIAL BIOPSY  03/11/2020   Procedure: BRONCHIAL BIOPSIES;  Surgeon: Garner Nash, DO;  Location: [facility];  Service: Pulmonary;;  . COLONOSCOPY    . CRYOTHERAPY  03/11/2020   Procedure: CRYOTHERAPY;  Surgeon: Garner Nash, DO;  Location: [facility];  Service: Pulmonary;;  . FINE NEEDLE ASPIRATION  03/11/2020   Procedure: FINE NEEDLE ASPIRATION (FNA) LINEAR;  Surgeon: Garner Nash, DO;  Location: [facility];  Service: Pulmonary;;  . IR IMAGING GUIDED PORT INSERTION  10/16/2020  . KNEE ARTHROSCOPY WITH ANTERIOR CRUCIATE LIGAMENT (ACL) REPAIR     x 2  . VIDEO BRONCHOSCOPY WITH ENDOBRONCHIAL ULTRASOUND N/A 03/11/2020   Procedure: VIDEO BRONCHOSCOPY WITH ENDOBRONCHIAL ULTRASOUND;  Surgeon: Garner Nash, DO;  Location: [facility];  Service: Pulmonary;  Laterality: N/A;    REVIEW OF SYSTEMS:   Review of Systems   Constitutional: Negative for appetite change, chills, fatigue, fever and unexpected weight change.  HENT: Negative for mouth sores, nosebleeds, sore throat and trouble swallowing.   Eyes: Negative for eye problems and icterus.  Respiratory: Positive for mild cough. Negative for hemoptysis, shortness of breath and wheezing.   Cardiovascular: Negative for chest pain and leg swelling.  Gastrointestinal: Negative for abdominal pain, constipation, diarrhea, nausea and vomiting.  Genitourinary: Negative for bladder incontinence, difficulty urinating, dysuria, frequency and hematuria.   Musculoskeletal: Positive for aching pain after neulasta. Negative for back pain, gait problem, neck pain and neck stiffness.  Skin: Negative for itching and rash.  Neurological: Negative for dizziness, extremity weakness, gait problem, headaches, light-headedness and seizures.  Hematological: Negative for adenopathy. Does not bruise/bleed easily.  Psychiatric/Behavioral: Negative for confusion, depression and sleep disturbance. The patient is not nervous/anxious.      PHYSICAL EXAMINATION:  Blood pressure 121/78, pulse 100, temperature 97.6 F (36.4 C), temperature source Tympanic, resp. rate 18, height 6' (1.829 m), weight  251 lb 8 oz (114.1 kg), SpO2 99 %.  ECOG PERFORMANCE STATUS: 1 - Symptomatic but completely ambulatory  Physical Exam  Constitutional: Oriented to person, place, and time and well-developed, well-nourished, and in no distress.  HENT:  Head: Normocephalic and atraumatic.  Mouth/Throat: Oropharynx is clear and moist. No oropharyngeal exudate.  Eyes: Conjunctivae are normal. Right eye exhibits no discharge. Left eye exhibits no discharge. No scleral icterus.  Neck: Normal range of motion. Neck supple.  Cardiovascular: Normal rate, regular rhythm, normal heart sounds and intact distal pulses.  Pulmonary/Chest: Effort normal and breath sounds normal. No respiratory distress. No wheezes. No  rales.  Abdominal: Soft. Bowel sounds are normal. Exhibits no distension and no mass. There is no tenderness.  Musculoskeletal: Normal range of motion. Exhibits no edema.  Lymphadenopathy:  No cervical adenopathy.  Neurological: Alert and oriented to person, place, and time. Exhibits normal muscle tone. Gait normal. Coordination normal.  Skin: Skin is warm and dry. No rash noted. Not diaphoretic. No erythema. No pallor.  Psychiatric: Mood, memory and judgment normal.  Vitals reviewed.  LABORATORY DATA: Lab Results  Component Value Date   WBC 9.6 10/23/2020   HGB 10.9 (L) 10/23/2020   HCT 32.9 (L) 10/23/2020   MCV 82.5 10/23/2020   PLT 187 10/23/2020      Chemistry      Component Value Date/Time   NA 137 10/23/2020 1006   K 4.2 10/23/2020 1006   CL 101 10/23/2020 1006   CO2 24 10/23/2020 1006   BUN 16 10/23/2020 1006   CREATININE 1.33 (H) 10/23/2020 1006   CREATININE 1.10 01/14/2020 0859      Component Value Date/Time   CALCIUM 8.9 10/23/2020 1006   ALKPHOS 154 (H) 10/23/2020 1006   AST 13 (L) 10/23/2020 1006   ALT 21 10/23/2020 1006   BILITOT 0.4 10/23/2020 1006       RADIOGRAPHIC STUDIES:  IR IMAGING GUIDED PORT INSERTION  Result Date: 10/16/2020 INDICATION: History of metastatic lung cancer. In need of durable intravenous access for chemotherapy administration Given the presence of right-sided disease as well as hypermetabolic right cervical lymphadenopathy, the decision was made to place a left internal jugular approach port a catheter. EXAM: IMPLANTED PORT A CATH PLACEMENT WITH ULTRASOUND AND FLUOROSCOPIC GUIDANCE COMPARISON:  PET-CT-09/16/2020 MEDICATIONS: Ancef 2 gm IV; The antibiotic was administered within an appropriate time interval prior to skin puncture. ANESTHESIA/SEDATION: Moderate (conscious) sedation was employed during this procedure. A total of Versed 2 mg and Fentanyl 100 mcg was administered intravenously. Moderate Sedation Time: 24 minutes. The  patient's level of consciousness and vital signs were monitored continuously by radiology nursing throughout the procedure under my direct supervision. CONTRAST:  None FLUOROSCOPY TIME:  54 seconds (11 mGy) COMPLICATIONS: None immediate. PROCEDURE: The procedure, risks, benefits, and alternatives were explained to the patient. Questions regarding the procedure were encouraged and answered. The patient understands and consents to the procedure. The left neck and chest were prepped with chlorhexidine in a sterile fashion, and a sterile drape was applied covering the operative field. Maximum barrier sterile technique with sterile gowns and gloves were used for the procedure. A timeout was performed prior to the initiation of the procedure. Local anesthesia was provided with 1% lidocaine with epinephrine. After creating a small venotomy incision, a micropuncture kit was utilized to access the internal jugular vein. Real-time ultrasound guidance was utilized for vascular access including the acquisition of a permanent ultrasound image documenting patency of the accessed vessel. The microwire was utilized  to measure appropriate catheter length. A subcutaneous port pocket was then created along the upper chest wall utilizing a combination of sharp and blunt dissection. The pocket was irrigated with sterile saline. A single lumen "ISP" sized power injectable port was chosen for placement. The 8 Fr catheter was tunneled from the port pocket site to the venotomy incision. The port was placed in the pocket. The external catheter was trimmed to appropriate length. At the venotomy, an 8 Fr peel-away sheath was placed over a guidewire under fluoroscopic guidance. The catheter was then placed through the sheath and the sheath was removed. Final catheter positioning was confirmed and documented with a fluoroscopic spot radiograph. The port was accessed with a Huber needle, aspirated and flushed with heparinized saline. The venotomy  site was closed with an interrupted 4-0 Vicryl suture. The port pocket incision was closed with interrupted 2-0 Vicryl suture. Dermabond and Steri-strips were applied to both incisions. Dressings were applied. The patient tolerated the procedure well without immediate post procedural complication. FINDINGS: After catheter placement, the tip lies within the superior cavoatrial junction. The catheter aspirates and flushes normally and is ready for immediate use. IMPRESSION: Successful placement of a left internal jugular approach power injectable Port-A-Cath. The catheter is ready for immediate use. Electronically Signed   By: Sandi Mariscal M.D.   On: 10/16/2020 10:20     ASSESSMENT/PLAN:  This is a42year old Caucasian male diagnosed withstage IIIa non-small cell lung cancer, squamous cell carcinoma in April 2021 presented with right upper lobe/suprahilar lung mass in addition to right paratracheal and subcarinal lymphadenopathy. He does not have any actionable mutations by [employer].  The patient completed a course of concurrent chemoradiation with weekly carboplatin for AUC of 2 and paclitaxel 45 MG/M2. He is status post 7 cycles. He tolerated the previous course of his treatment well except for mild dysphagia and odynophagia.  He then was on consolidation immunotherapy with Imfinzi 1500 mg IV every 4 weeks. Status post3cycles. This was discontinued secondary to evidence of disease progression.  The patient is currently undergoing palliative systemic chemotherapy with carboplatin for an AUC of 5, paclitaxel 175 mg per metered squared, Keytruda 200 mg IV every 3 weeks with Neulasta support.  He is status post 1 cycle and he tolerated it well except for arthralgias secondary to the neulasta injection.   The patient was seen with Dr. Julien Nordmann today.  Labs were reviewed.  Recommend that he proceed with cycle #2 today scheduled.  We will see him back for follow-up visit in 3 weeks for  evaluation before starting cycle #3.  He will continue to follow with his PCP regarding his diabetes mellitus.  He will continue using Percocet if needed for the myalgias and arthralgias secondary to the Neulasta injection.  The patient would like his weekly labs done at [facility]. I will include this request on my LOS.   I have sent EMLA cream to the pharmacy for his port-a-cath.   The patient was advised to call immediately if he has any concerning symptoms in the interval. The patient voices understanding of current disease status and treatment options and is in agreement with the current care plan. All questions were answered. The patient knows to call the clinic with any problems, questions or concerns. We can certainly see the patient much sooner if necessary  No orders of the defined types were placed in this encounter.    William Farrell L Kynisha Memon, PA-C 10/23/20  ADDENDUM: Hematology/Oncology Attending: I had a face-to-face encounter  with the patient today.  I recommended his care plan.  This is a very pleasant 59 years old white male with metastatic non-small cell lung cancer that was initially diagnosed as a stage IIIa squamous cell carcinoma in April 2021 status post concurrent chemoradiation with partial response followed by consolidation treatment with immunotherapy with Imfinzi 1500 mg IV every 4 weeks status post 3 cycles discontinued secondary to disease progression. The patient is currently undergoing systemic chemotherapy with carboplatin, paclitaxel and Keytruda status post 1 cycle.  He tolerated the first cycle of his treatment well except for the alopecia and mild fatigue. He had molecular studies by [employer] that showed no actionable mutations.  The patient is also being evaluated for clinical trial at [facility] center for future treatment options if he fails this course of treatment. I recommended for the patient to proceed with cycle #2 today as planned. He  will come back for follow-up visit in 3 weeks for evaluation before the next cycle of his treatment. He was advised to call immediately if he has any concerning symptoms in the interval.  Disclaimer: This note was dictated with voice recognition software. Similar sounding words can inadvertently be transcribed and may be missed upon review. Eilleen Kempf, MD 10/23/20

## 2020-10-23 ENCOUNTER — Inpatient Hospital Stay: Payer: BC Managed Care – PPO

## 2020-10-23 ENCOUNTER — Other Ambulatory Visit: Payer: Self-pay

## 2020-10-23 ENCOUNTER — Encounter: Payer: Self-pay | Admitting: Physician Assistant

## 2020-10-23 ENCOUNTER — Inpatient Hospital Stay: Payer: BC Managed Care – PPO | Attending: Internal Medicine | Admitting: Physician Assistant

## 2020-10-23 VITALS — BP 121/78 | HR 100 | Temp 97.6°F | Resp 18 | Ht 72.0 in | Wt 251.5 lb

## 2020-10-23 DIAGNOSIS — R21 Rash and other nonspecific skin eruption: Secondary | ICD-10-CM | POA: Diagnosis not present

## 2020-10-23 DIAGNOSIS — K219 Gastro-esophageal reflux disease without esophagitis: Secondary | ICD-10-CM | POA: Insufficient documentation

## 2020-10-23 DIAGNOSIS — Z7984 Long term (current) use of oral hypoglycemic drugs: Secondary | ICD-10-CM | POA: Diagnosis not present

## 2020-10-23 DIAGNOSIS — Z79899 Other long term (current) drug therapy: Secondary | ICD-10-CM | POA: Insufficient documentation

## 2020-10-23 DIAGNOSIS — Z923 Personal history of irradiation: Secondary | ICD-10-CM | POA: Insufficient documentation

## 2020-10-23 DIAGNOSIS — C3411 Malignant neoplasm of upper lobe, right bronchus or lung: Secondary | ICD-10-CM | POA: Insufficient documentation

## 2020-10-23 DIAGNOSIS — R918 Other nonspecific abnormal finding of lung field: Secondary | ICD-10-CM

## 2020-10-23 DIAGNOSIS — Z791 Long term (current) use of non-steroidal anti-inflammatories (NSAID): Secondary | ICD-10-CM | POA: Diagnosis not present

## 2020-10-23 DIAGNOSIS — Z5112 Encounter for antineoplastic immunotherapy: Secondary | ICD-10-CM | POA: Diagnosis not present

## 2020-10-23 DIAGNOSIS — Z5111 Encounter for antineoplastic chemotherapy: Secondary | ICD-10-CM | POA: Diagnosis not present

## 2020-10-23 DIAGNOSIS — Z5189 Encounter for other specified aftercare: Secondary | ICD-10-CM | POA: Diagnosis not present

## 2020-10-23 DIAGNOSIS — E119 Type 2 diabetes mellitus without complications: Secondary | ICD-10-CM | POA: Insufficient documentation

## 2020-10-23 DIAGNOSIS — R5383 Other fatigue: Secondary | ICD-10-CM | POA: Insufficient documentation

## 2020-10-23 DIAGNOSIS — Z7982 Long term (current) use of aspirin: Secondary | ICD-10-CM | POA: Diagnosis not present

## 2020-10-23 DIAGNOSIS — E7800 Pure hypercholesterolemia, unspecified: Secondary | ICD-10-CM | POA: Diagnosis not present

## 2020-10-23 DIAGNOSIS — Z95828 Presence of other vascular implants and grafts: Secondary | ICD-10-CM

## 2020-10-23 LAB — CBC WITH DIFFERENTIAL (CANCER CENTER ONLY)
Abs Immature Granulocytes: 0.05 10*3/uL (ref 0.00–0.07)
Basophils Absolute: 0.1 10*3/uL (ref 0.0–0.1)
Basophils Relative: 1 %
Eosinophils Absolute: 0.2 10*3/uL (ref 0.0–0.5)
Eosinophils Relative: 2 %
HCT: 32.9 % — ABNORMAL LOW (ref 39.0–52.0)
Hemoglobin: 10.9 g/dL — ABNORMAL LOW (ref 13.0–17.0)
Immature Granulocytes: 1 %
Lymphocytes Relative: 8 %
Lymphs Abs: 0.8 10*3/uL (ref 0.7–4.0)
MCH: 27.3 pg (ref 26.0–34.0)
MCHC: 33.1 g/dL (ref 30.0–36.0)
MCV: 82.5 fL (ref 80.0–100.0)
Monocytes Absolute: 1 10*3/uL (ref 0.1–1.0)
Monocytes Relative: 10 %
Neutro Abs: 7.5 10*3/uL (ref 1.7–7.7)
Neutrophils Relative %: 78 %
Platelet Count: 187 10*3/uL (ref 150–400)
RBC: 3.99 MIL/uL — ABNORMAL LOW (ref 4.22–5.81)
RDW: 15.7 % — ABNORMAL HIGH (ref 11.5–15.5)
WBC Count: 9.6 10*3/uL (ref 4.0–10.5)
nRBC: 0 % (ref 0.0–0.2)

## 2020-10-23 LAB — CMP (CANCER CENTER ONLY)
ALT: 21 U/L (ref 0–44)
AST: 13 U/L — ABNORMAL LOW (ref 15–41)
Albumin: 3.4 g/dL — ABNORMAL LOW (ref 3.5–5.0)
Alkaline Phosphatase: 154 U/L — ABNORMAL HIGH (ref 38–126)
Anion gap: 12 (ref 5–15)
BUN: 16 mg/dL (ref 6–20)
CO2: 24 mmol/L (ref 22–32)
Calcium: 8.9 mg/dL (ref 8.9–10.3)
Chloride: 101 mmol/L (ref 98–111)
Creatinine: 1.33 mg/dL — ABNORMAL HIGH (ref 0.61–1.24)
GFR, Estimated: >60 mL/min (ref >60)
Glucose, Bld: 330 mg/dL — ABNORMAL HIGH (ref 70–99)
Potassium: 4.2 mmol/L (ref 3.5–5.1)
Sodium: 137 mmol/L (ref 135–145)
Total Bilirubin: 0.4 mg/dL (ref 0.3–1.2)
Total Protein: 7.3 g/dL (ref 6.5–8.1)

## 2020-10-23 LAB — TSH: TSH: 2.773 u[IU]/mL (ref 0.320–4.118)

## 2020-10-23 MED ORDER — DIPHENHYDRAMINE HCL 50 MG/ML IJ SOLN
INTRAMUSCULAR | Status: AC
  Filled 2020-10-23: qty 1

## 2020-10-23 MED ORDER — DIPHENHYDRAMINE HCL 50 MG/ML IJ SOLN
50.0000 mg | Freq: Once | INTRAMUSCULAR | Status: AC
  Administered 2020-10-23: 50 mg via INTRAVENOUS

## 2020-10-23 MED ORDER — SODIUM CHLORIDE 0.9 % IV SOLN
150.0000 mg | Freq: Once | INTRAVENOUS | Status: AC
  Administered 2020-10-23: 150 mg via INTRAVENOUS
  Filled 2020-10-23: qty 150

## 2020-10-23 MED ORDER — SODIUM CHLORIDE 0.9 % IV SOLN
200.0000 mg | Freq: Once | INTRAVENOUS | Status: AC
  Administered 2020-10-23: 200 mg via INTRAVENOUS
  Filled 2020-10-23: qty 8

## 2020-10-23 MED ORDER — FAMOTIDINE IN NACL 20-0.9 MG/50ML-% IV SOLN
INTRAVENOUS | Status: AC
  Filled 2020-10-23: qty 50

## 2020-10-23 MED ORDER — HEPARIN SOD (PORK) LOCK FLUSH 100 UNIT/ML IV SOLN
500.0000 [IU] | Freq: Once | INTRAVENOUS | Status: AC | PRN
  Administered 2020-10-23: 500 [IU]
  Filled 2020-10-23: qty 5

## 2020-10-23 MED ORDER — SODIUM CHLORIDE 0.9 % IV SOLN
10.0000 mg | Freq: Once | INTRAVENOUS | Status: AC
  Administered 2020-10-23: 10 mg via INTRAVENOUS
  Filled 2020-10-23: qty 10

## 2020-10-23 MED ORDER — LIDOCAINE-PRILOCAINE 2.5-2.5 % EX CREA: 1.0000 "application " | TOPICAL_CREAM | CUTANEOUS | 2 refills | Status: DC | PRN

## 2020-10-23 MED ORDER — SODIUM CHLORIDE 0.9 % IV SOLN
175.0000 mg/m2 | Freq: Once | INTRAVENOUS | Status: AC
  Administered 2020-10-23: 420 mg via INTRAVENOUS
  Filled 2020-10-23: qty 70

## 2020-10-23 MED ORDER — PALONOSETRON HCL INJECTION 0.25 MG/5ML
INTRAVENOUS | Status: AC
  Filled 2020-10-23: qty 5

## 2020-10-23 MED ORDER — PALONOSETRON HCL INJECTION 0.25 MG/5ML
0.2500 mg | Freq: Once | INTRAVENOUS | Status: AC
  Administered 2020-10-23: 0.25 mg via INTRAVENOUS

## 2020-10-23 MED ORDER — SODIUM CHLORIDE 0.9% FLUSH
10.0000 mL | INTRAVENOUS | Status: DC | PRN
  Administered 2020-10-23: 10 mL
  Filled 2020-10-23: qty 10

## 2020-10-23 MED ORDER — SODIUM CHLORIDE 0.9% FLUSH
10.0000 mL | INTRAVENOUS | Status: DC | PRN
  Administered 2020-10-23: 10 mL via INTRAVENOUS
  Filled 2020-10-23: qty 10

## 2020-10-23 MED ORDER — FAMOTIDINE IN NACL 20-0.9 MG/50ML-% IV SOLN
20.0000 mg | Freq: Once | INTRAVENOUS | Status: AC
  Administered 2020-10-23: 20 mg via INTRAVENOUS

## 2020-10-23 MED ORDER — SODIUM CHLORIDE 0.9 % IV SOLN
Freq: Once | INTRAVENOUS | Status: AC
  Filled 2020-10-23: qty 250

## 2020-10-23 MED ORDER — SODIUM CHLORIDE 0.9 % IV SOLN
596.0000 mg | Freq: Once | INTRAVENOUS | Status: AC
  Administered 2020-10-23: 600 mg via INTRAVENOUS
  Filled 2020-10-23: qty 60

## 2020-10-23 NOTE — Patient Instructions (Signed)

## 2020-10-23 NOTE — Patient Instructions (Signed)
[facility] Discharge Instructions for Patients Receiving Chemotherapy  Today you received the following immunotherapy agent: Pembrolizumab (Keytruda) and chemotherapy agents: Paclitaxel (Taxol) and Carboplatin.  To help prevent nausea and vomiting after your treatment, we encourage you to take your nausea medication as directed by your MD.   If you develop nausea and vomiting that is not controlled by your nausea medication, call the clinic.   BELOW ARE SYMPTOMS THAT SHOULD BE REPORTED IMMEDIATELY:  *FEVER GREATER THAN 100.5 F  *CHILLS WITH OR WITHOUT FEVER  NAUSEA AND VOMITING THAT IS NOT CONTROLLED WITH YOUR NAUSEA MEDICATION  *UNUSUAL SHORTNESS OF BREATH  *UNUSUAL BRUISING OR BLEEDING  TENDERNESS IN MOUTH AND THROAT WITH OR WITHOUT PRESENCE OF ULCERS  *URINARY PROBLEMS  *BOWEL PROBLEMS  UNUSUAL RASH Items with * indicate a potential emergency and should be followed up as soon as possible.  Feel free to call the clinic should you have any questions or concerns. The clinic phone number is [phone].  Please show the [facility] at check-in to the Emergency Department and triage nurse.

## 2020-10-23 NOTE — Progress Notes (Signed)
Pt. states he felt "restless and jittery" after  Benadryl 50 mg dose. Pt. was able to sleep and felt better. Dr. Julien Nordmann notified and states he will decrease dose to 25 mg for further treatments.

## 2020-10-25 ENCOUNTER — Inpatient Hospital Stay: Payer: BC Managed Care – PPO

## 2020-10-25 ENCOUNTER — Other Ambulatory Visit: Payer: Self-pay

## 2020-10-25 VITALS — BP 121/66 | HR 96 | Temp 96.7°F | Resp 18

## 2020-10-25 DIAGNOSIS — Z791 Long term (current) use of non-steroidal anti-inflammatories (NSAID): Secondary | ICD-10-CM | POA: Diagnosis not present

## 2020-10-25 DIAGNOSIS — C3411 Malignant neoplasm of upper lobe, right bronchus or lung: Secondary | ICD-10-CM | POA: Diagnosis not present

## 2020-10-25 DIAGNOSIS — Z7984 Long term (current) use of oral hypoglycemic drugs: Secondary | ICD-10-CM | POA: Diagnosis not present

## 2020-10-25 DIAGNOSIS — E119 Type 2 diabetes mellitus without complications: Secondary | ICD-10-CM | POA: Diagnosis not present

## 2020-10-25 DIAGNOSIS — Z5111 Encounter for antineoplastic chemotherapy: Secondary | ICD-10-CM | POA: Diagnosis not present

## 2020-10-25 DIAGNOSIS — R5383 Other fatigue: Secondary | ICD-10-CM | POA: Diagnosis not present

## 2020-10-25 DIAGNOSIS — Z5189 Encounter for other specified aftercare: Secondary | ICD-10-CM | POA: Diagnosis not present

## 2020-10-25 DIAGNOSIS — Z79899 Other long term (current) drug therapy: Secondary | ICD-10-CM | POA: Diagnosis not present

## 2020-10-25 DIAGNOSIS — Z923 Personal history of irradiation: Secondary | ICD-10-CM | POA: Diagnosis not present

## 2020-10-25 DIAGNOSIS — Z5112 Encounter for antineoplastic immunotherapy: Secondary | ICD-10-CM | POA: Diagnosis not present

## 2020-10-25 DIAGNOSIS — K219 Gastro-esophageal reflux disease without esophagitis: Secondary | ICD-10-CM | POA: Diagnosis not present

## 2020-10-25 DIAGNOSIS — R21 Rash and other nonspecific skin eruption: Secondary | ICD-10-CM | POA: Diagnosis not present

## 2020-10-25 DIAGNOSIS — Z7982 Long term (current) use of aspirin: Secondary | ICD-10-CM | POA: Diagnosis not present

## 2020-10-25 DIAGNOSIS — E7800 Pure hypercholesterolemia, unspecified: Secondary | ICD-10-CM | POA: Diagnosis not present

## 2020-10-25 MED ORDER — PEGFILGRASTIM-CBQV 6 MG/0.6ML ~~LOC~~ SOSY
6.0000 mg | PREFILLED_SYRINGE | Freq: Once | SUBCUTANEOUS | Status: AC
  Administered 2020-10-25: 6 mg via SUBCUTANEOUS

## 2020-10-28 ENCOUNTER — Telehealth: Payer: Self-pay | Admitting: Medical Oncology

## 2020-10-28 NOTE — Telephone Encounter (Signed)
Schedule message sent to [facility] at [facility] to schedule pt for weekly labs .

## 2020-10-28 NOTE — Telephone Encounter (Signed)
New raised facial "rash" -describes as "acne -like"on  check,lips , chin , neck  -no itching/ or pain. Per Dr Julien Nordmann I instructed pt to use Hydorcortisone cream.

## 2020-10-29 ENCOUNTER — Other Ambulatory Visit: Payer: Self-pay

## 2020-10-29 ENCOUNTER — Inpatient Hospital Stay: Payer: BC Managed Care – PPO

## 2020-10-29 DIAGNOSIS — E7800 Pure hypercholesterolemia, unspecified: Secondary | ICD-10-CM | POA: Diagnosis not present

## 2020-10-29 DIAGNOSIS — Z5112 Encounter for antineoplastic immunotherapy: Secondary | ICD-10-CM | POA: Diagnosis not present

## 2020-10-29 DIAGNOSIS — Z5189 Encounter for other specified aftercare: Secondary | ICD-10-CM | POA: Diagnosis not present

## 2020-10-29 DIAGNOSIS — C3411 Malignant neoplasm of upper lobe, right bronchus or lung: Secondary | ICD-10-CM

## 2020-10-29 DIAGNOSIS — K219 Gastro-esophageal reflux disease without esophagitis: Secondary | ICD-10-CM | POA: Diagnosis not present

## 2020-10-29 DIAGNOSIS — Z923 Personal history of irradiation: Secondary | ICD-10-CM | POA: Diagnosis not present

## 2020-10-29 DIAGNOSIS — Z7982 Long term (current) use of aspirin: Secondary | ICD-10-CM | POA: Diagnosis not present

## 2020-10-29 DIAGNOSIS — R21 Rash and other nonspecific skin eruption: Secondary | ICD-10-CM | POA: Diagnosis not present

## 2020-10-29 DIAGNOSIS — Z7984 Long term (current) use of oral hypoglycemic drugs: Secondary | ICD-10-CM | POA: Diagnosis not present

## 2020-10-29 DIAGNOSIS — R5383 Other fatigue: Secondary | ICD-10-CM | POA: Diagnosis not present

## 2020-10-29 DIAGNOSIS — Z79899 Other long term (current) drug therapy: Secondary | ICD-10-CM | POA: Diagnosis not present

## 2020-10-29 DIAGNOSIS — E119 Type 2 diabetes mellitus without complications: Secondary | ICD-10-CM | POA: Diagnosis not present

## 2020-10-29 DIAGNOSIS — Z5111 Encounter for antineoplastic chemotherapy: Secondary | ICD-10-CM | POA: Diagnosis not present

## 2020-10-29 DIAGNOSIS — Z791 Long term (current) use of non-steroidal anti-inflammatories (NSAID): Secondary | ICD-10-CM | POA: Diagnosis not present

## 2020-10-29 LAB — CMP (CANCER CENTER ONLY)
ALT: 26 U/L (ref 0–44)
AST: 20 U/L (ref 15–41)
Albumin: 4.1 g/dL (ref 3.5–5.0)
Alkaline Phosphatase: 187 U/L — ABNORMAL HIGH (ref 38–126)
Anion gap: 7 (ref 5–15)
BUN: 20 mg/dL (ref 6–20)
CO2: 28 mmol/L (ref 22–32)
Calcium: 9.7 mg/dL (ref 8.9–10.3)
Chloride: 102 mmol/L (ref 98–111)
Creatinine: 1.03 mg/dL (ref 0.61–1.24)
GFR, Estimated: >60 mL/min (ref >60)
Glucose, Bld: 223 mg/dL — ABNORMAL HIGH (ref 70–99)
Potassium: 4.8 mmol/L (ref 3.5–5.1)
Sodium: 137 mmol/L (ref 135–145)
Total Bilirubin: 0.3 mg/dL (ref 0.3–1.2)
Total Protein: 6.9 g/dL (ref 6.5–8.1)

## 2020-10-29 LAB — CBC WITH DIFFERENTIAL (CANCER CENTER ONLY)
Abs Immature Granulocytes: 0.13 10*3/uL — ABNORMAL HIGH (ref 0.00–0.07)
Basophils Absolute: 0.1 10*3/uL (ref 0.0–0.1)
Basophils Relative: 1 %
Eosinophils Absolute: 0.3 10*3/uL (ref 0.0–0.5)
Eosinophils Relative: 3 %
HCT: 33.1 % — ABNORMAL LOW (ref 39.0–52.0)
Hemoglobin: 10.9 g/dL — ABNORMAL LOW (ref 13.0–17.0)
Immature Granulocytes: 1 %
Lymphocytes Relative: 11 %
Lymphs Abs: 1.1 10*3/uL (ref 0.7–4.0)
MCH: 27.6 pg (ref 26.0–34.0)
MCHC: 32.9 g/dL (ref 30.0–36.0)
MCV: 83.8 fL (ref 80.0–100.0)
Monocytes Absolute: 0.7 10*3/uL (ref 0.1–1.0)
Monocytes Relative: 7 %
Neutro Abs: 8.4 10*3/uL — ABNORMAL HIGH (ref 1.7–7.7)
Neutrophils Relative %: 77 %
Platelet Count: 136 10*3/uL — ABNORMAL LOW (ref 150–400)
RBC: 3.95 MIL/uL — ABNORMAL LOW (ref 4.22–5.81)
RDW: 15.9 % — ABNORMAL HIGH (ref 11.5–15.5)
WBC Count: 10.8 10*3/uL — ABNORMAL HIGH (ref 4.0–10.5)
nRBC: 0 % (ref 0.0–0.2)

## 2020-10-30 ENCOUNTER — Ambulatory Visit: Payer: BC Managed Care – PPO | Admitting: Physician Assistant

## 2020-10-30 ENCOUNTER — Other Ambulatory Visit: Payer: BC Managed Care – PPO

## 2020-10-30 ENCOUNTER — Ambulatory Visit: Payer: BC Managed Care – PPO

## 2020-11-05 ENCOUNTER — Inpatient Hospital Stay: Payer: BC Managed Care – PPO

## 2020-11-05 ENCOUNTER — Other Ambulatory Visit: Payer: Self-pay

## 2020-11-05 DIAGNOSIS — Z5112 Encounter for antineoplastic immunotherapy: Secondary | ICD-10-CM | POA: Diagnosis not present

## 2020-11-05 DIAGNOSIS — K219 Gastro-esophageal reflux disease without esophagitis: Secondary | ICD-10-CM | POA: Diagnosis not present

## 2020-11-05 DIAGNOSIS — Z791 Long term (current) use of non-steroidal anti-inflammatories (NSAID): Secondary | ICD-10-CM | POA: Diagnosis not present

## 2020-11-05 DIAGNOSIS — R21 Rash and other nonspecific skin eruption: Secondary | ICD-10-CM | POA: Diagnosis not present

## 2020-11-05 DIAGNOSIS — R5383 Other fatigue: Secondary | ICD-10-CM | POA: Diagnosis not present

## 2020-11-05 DIAGNOSIS — E119 Type 2 diabetes mellitus without complications: Secondary | ICD-10-CM | POA: Diagnosis not present

## 2020-11-05 DIAGNOSIS — C3411 Malignant neoplasm of upper lobe, right bronchus or lung: Secondary | ICD-10-CM

## 2020-11-05 DIAGNOSIS — Z7982 Long term (current) use of aspirin: Secondary | ICD-10-CM | POA: Diagnosis not present

## 2020-11-05 DIAGNOSIS — Z5111 Encounter for antineoplastic chemotherapy: Secondary | ICD-10-CM | POA: Diagnosis not present

## 2020-11-05 DIAGNOSIS — Z923 Personal history of irradiation: Secondary | ICD-10-CM | POA: Diagnosis not present

## 2020-11-05 DIAGNOSIS — Z79899 Other long term (current) drug therapy: Secondary | ICD-10-CM | POA: Diagnosis not present

## 2020-11-05 DIAGNOSIS — E7800 Pure hypercholesterolemia, unspecified: Secondary | ICD-10-CM | POA: Diagnosis not present

## 2020-11-05 DIAGNOSIS — Z7984 Long term (current) use of oral hypoglycemic drugs: Secondary | ICD-10-CM | POA: Diagnosis not present

## 2020-11-05 DIAGNOSIS — Z5189 Encounter for other specified aftercare: Secondary | ICD-10-CM | POA: Diagnosis not present

## 2020-11-05 LAB — CBC WITH DIFFERENTIAL (CANCER CENTER ONLY)
Abs Immature Granulocytes: 0.14 10*3/uL — ABNORMAL HIGH (ref 0.00–0.07)
Basophils Absolute: 0.1 10*3/uL (ref 0.0–0.1)
Basophils Relative: 1 %
Eosinophils Absolute: 0.3 10*3/uL (ref 0.0–0.5)
Eosinophils Relative: 3 %
HCT: 34.4 % — ABNORMAL LOW (ref 39.0–52.0)
Hemoglobin: 11.4 g/dL — ABNORMAL LOW (ref 13.0–17.0)
Immature Granulocytes: 1 %
Lymphocytes Relative: 9 %
Lymphs Abs: 1 10*3/uL (ref 0.7–4.0)
MCH: 27.7 pg (ref 26.0–34.0)
MCHC: 33.1 g/dL (ref 30.0–36.0)
MCV: 83.7 fL (ref 80.0–100.0)
Monocytes Absolute: 0.8 10*3/uL (ref 0.1–1.0)
Monocytes Relative: 7 %
Neutro Abs: 9.4 10*3/uL — ABNORMAL HIGH (ref 1.7–7.7)
Neutrophils Relative %: 79 %
Platelet Count: 159 10*3/uL (ref 150–400)
RBC: 4.11 MIL/uL — ABNORMAL LOW (ref 4.22–5.81)
RDW: 16.8 % — ABNORMAL HIGH (ref 11.5–15.5)
WBC Count: 11.7 10*3/uL — ABNORMAL HIGH (ref 4.0–10.5)
nRBC: 0 % (ref 0.0–0.2)

## 2020-11-05 LAB — CMP (CANCER CENTER ONLY)
ALT: 17 U/L (ref 0–44)
AST: 11 U/L — ABNORMAL LOW (ref 15–41)
Albumin: 4.1 g/dL (ref 3.5–5.0)
Alkaline Phosphatase: 173 U/L — ABNORMAL HIGH (ref 38–126)
Anion gap: 11 (ref 5–15)
BUN: 16 mg/dL (ref 6–20)
CO2: 25 mmol/L (ref 22–32)
Calcium: 10.2 mg/dL (ref 8.9–10.3)
Chloride: 101 mmol/L (ref 98–111)
Creatinine: 1.06 mg/dL (ref 0.61–1.24)
GFR, Estimated: >60 mL/min (ref >60)
Glucose, Bld: 244 mg/dL — ABNORMAL HIGH (ref 70–99)
Potassium: 4.1 mmol/L (ref 3.5–5.1)
Sodium: 137 mmol/L (ref 135–145)
Total Bilirubin: 0.3 mg/dL (ref 0.3–1.2)
Total Protein: 7.5 g/dL (ref 6.5–8.1)

## 2020-11-05 LAB — TSH: TSH: 4.934 u[IU]/mL — ABNORMAL HIGH (ref 0.320–4.118)

## 2020-11-13 ENCOUNTER — Inpatient Hospital Stay: Payer: BC Managed Care – PPO

## 2020-11-13 ENCOUNTER — Inpatient Hospital Stay (HOSPITAL_BASED_OUTPATIENT_CLINIC_OR_DEPARTMENT_OTHER): Payer: BC Managed Care – PPO | Admitting: Internal Medicine

## 2020-11-13 ENCOUNTER — Other Ambulatory Visit: Payer: Self-pay

## 2020-11-13 ENCOUNTER — Telehealth: Payer: Self-pay | Admitting: Internal Medicine

## 2020-11-13 VITALS — BP 120/74 | HR 104 | Temp 97.7°F | Resp 20 | Ht 72.0 in | Wt 248.0 lb

## 2020-11-13 VITALS — HR 98

## 2020-11-13 DIAGNOSIS — R21 Rash and other nonspecific skin eruption: Secondary | ICD-10-CM | POA: Diagnosis not present

## 2020-11-13 DIAGNOSIS — C3411 Malignant neoplasm of upper lobe, right bronchus or lung: Secondary | ICD-10-CM

## 2020-11-13 DIAGNOSIS — E119 Type 2 diabetes mellitus without complications: Secondary | ICD-10-CM | POA: Diagnosis not present

## 2020-11-13 DIAGNOSIS — Z7982 Long term (current) use of aspirin: Secondary | ICD-10-CM | POA: Diagnosis not present

## 2020-11-13 DIAGNOSIS — E7800 Pure hypercholesterolemia, unspecified: Secondary | ICD-10-CM | POA: Diagnosis not present

## 2020-11-13 DIAGNOSIS — K219 Gastro-esophageal reflux disease without esophagitis: Secondary | ICD-10-CM | POA: Diagnosis not present

## 2020-11-13 DIAGNOSIS — Z7984 Long term (current) use of oral hypoglycemic drugs: Secondary | ICD-10-CM | POA: Diagnosis not present

## 2020-11-13 DIAGNOSIS — Z791 Long term (current) use of non-steroidal anti-inflammatories (NSAID): Secondary | ICD-10-CM | POA: Diagnosis not present

## 2020-11-13 DIAGNOSIS — Z79899 Other long term (current) drug therapy: Secondary | ICD-10-CM | POA: Diagnosis not present

## 2020-11-13 DIAGNOSIS — R5383 Other fatigue: Secondary | ICD-10-CM | POA: Diagnosis not present

## 2020-11-13 DIAGNOSIS — C3490 Malignant neoplasm of unspecified part of unspecified bronchus or lung: Secondary | ICD-10-CM

## 2020-11-13 DIAGNOSIS — Z5111 Encounter for antineoplastic chemotherapy: Secondary | ICD-10-CM | POA: Diagnosis not present

## 2020-11-13 DIAGNOSIS — Z95828 Presence of other vascular implants and grafts: Secondary | ICD-10-CM

## 2020-11-13 DIAGNOSIS — Z5189 Encounter for other specified aftercare: Secondary | ICD-10-CM | POA: Diagnosis not present

## 2020-11-13 DIAGNOSIS — Z923 Personal history of irradiation: Secondary | ICD-10-CM | POA: Diagnosis not present

## 2020-11-13 DIAGNOSIS — Z5112 Encounter for antineoplastic immunotherapy: Secondary | ICD-10-CM

## 2020-11-13 LAB — CBC WITH DIFFERENTIAL (CANCER CENTER ONLY)
Abs Immature Granulocytes: 0.04 10*3/uL (ref 0.00–0.07)
Basophils Absolute: 0.1 10*3/uL (ref 0.0–0.1)
Basophils Relative: 1 %
Eosinophils Absolute: 0.1 10*3/uL (ref 0.0–0.5)
Eosinophils Relative: 2 %
HCT: 31 % — ABNORMAL LOW (ref 39.0–52.0)
Hemoglobin: 10.2 g/dL — ABNORMAL LOW (ref 13.0–17.0)
Immature Granulocytes: 1 %
Lymphocytes Relative: 11 %
Lymphs Abs: 0.9 10*3/uL (ref 0.7–4.0)
MCH: 27.6 pg (ref 26.0–34.0)
MCHC: 32.9 g/dL (ref 30.0–36.0)
MCV: 83.8 fL (ref 80.0–100.0)
Monocytes Absolute: 0.9 10*3/uL (ref 0.1–1.0)
Monocytes Relative: 10 %
Neutro Abs: 6.6 10*3/uL (ref 1.7–7.7)
Neutrophils Relative %: 75 %
Platelet Count: 172 10*3/uL (ref 150–400)
RBC: 3.7 MIL/uL — ABNORMAL LOW (ref 4.22–5.81)
RDW: 17.9 % — ABNORMAL HIGH (ref 11.5–15.5)
WBC Count: 8.7 10*3/uL (ref 4.0–10.5)
nRBC: 0 % (ref 0.0–0.2)

## 2020-11-13 LAB — CMP (CANCER CENTER ONLY)
ALT: 19 U/L (ref 0–44)
AST: 13 U/L — ABNORMAL LOW (ref 15–41)
Albumin: 3.4 g/dL — ABNORMAL LOW (ref 3.5–5.0)
Alkaline Phosphatase: 156 U/L — ABNORMAL HIGH (ref 38–126)
Anion gap: 7 (ref 5–15)
BUN: 17 mg/dL (ref 6–20)
CO2: 25 mmol/L (ref 22–32)
Calcium: 9.3 mg/dL (ref 8.9–10.3)
Chloride: 106 mmol/L (ref 98–111)
Creatinine: 1.11 mg/dL (ref 0.61–1.24)
GFR, Estimated: >60 mL/min (ref >60)
Glucose, Bld: 279 mg/dL — ABNORMAL HIGH (ref 70–99)
Potassium: 4.1 mmol/L (ref 3.5–5.1)
Sodium: 138 mmol/L (ref 135–145)
Total Bilirubin: 0.3 mg/dL (ref 0.3–1.2)
Total Protein: 7.3 g/dL (ref 6.5–8.1)

## 2020-11-13 MED ORDER — SODIUM CHLORIDE 0.9% FLUSH
10.0000 mL | INTRAVENOUS | Status: DC | PRN
  Administered 2020-11-13: 10 mL
  Filled 2020-11-13: qty 10

## 2020-11-13 MED ORDER — SODIUM CHLORIDE 0.9 % IV SOLN
10.0000 mg | Freq: Once | INTRAVENOUS | Status: AC
  Administered 2020-11-13: 10 mg via INTRAVENOUS
  Filled 2020-11-13: qty 10

## 2020-11-13 MED ORDER — HEPARIN SOD (PORK) LOCK FLUSH 100 UNIT/ML IV SOLN
500.0000 [IU] | Freq: Once | INTRAVENOUS | Status: AC | PRN
  Administered 2020-11-13: 500 [IU]
  Filled 2020-11-13: qty 5

## 2020-11-13 MED ORDER — SODIUM CHLORIDE 0.9 % IV SOLN: 716.0000 mg | Freq: Once | INTRAVENOUS | Status: DC

## 2020-11-13 MED ORDER — SODIUM CHLORIDE 0.9% FLUSH
10.0000 mL | Freq: Once | INTRAVENOUS | Status: AC
  Administered 2020-11-13: 10 mL via INTRAVENOUS
  Filled 2020-11-13: qty 10

## 2020-11-13 MED ORDER — FAMOTIDINE IN NACL 20-0.9 MG/50ML-% IV SOLN
INTRAVENOUS | Status: AC
  Filled 2020-11-13: qty 50

## 2020-11-13 MED ORDER — SODIUM CHLORIDE 0.9 % IV SOLN
200.0000 mg | Freq: Once | INTRAVENOUS | Status: AC
  Administered 2020-11-13: 200 mg via INTRAVENOUS
  Filled 2020-11-13: qty 8

## 2020-11-13 MED ORDER — FAMOTIDINE IN NACL 20-0.9 MG/50ML-% IV SOLN
20.0000 mg | Freq: Once | INTRAVENOUS | Status: AC
  Administered 2020-11-13: 20 mg via INTRAVENOUS

## 2020-11-13 MED ORDER — SODIUM CHLORIDE 0.9 % IV SOLN
Freq: Once | INTRAVENOUS | Status: AC
  Filled 2020-11-13: qty 250

## 2020-11-13 MED ORDER — PALONOSETRON HCL INJECTION 0.25 MG/5ML
0.2500 mg | Freq: Once | INTRAVENOUS | Status: AC
  Administered 2020-11-13: 0.25 mg via INTRAVENOUS

## 2020-11-13 MED ORDER — PALONOSETRON HCL INJECTION 0.25 MG/5ML
INTRAVENOUS | Status: AC
  Filled 2020-11-13: qty 5

## 2020-11-13 MED ORDER — SODIUM CHLORIDE 0.9 % IV SOLN
680.0000 mg | Freq: Once | INTRAVENOUS | Status: AC
  Administered 2020-11-13: 680 mg via INTRAVENOUS
  Filled 2020-11-13: qty 68

## 2020-11-13 MED ORDER — SODIUM CHLORIDE 0.9 % IV SOLN
175.0000 mg/m2 | Freq: Once | INTRAVENOUS | Status: AC
  Administered 2020-11-13: 420 mg via INTRAVENOUS
  Filled 2020-11-13: qty 70

## 2020-11-13 MED ORDER — DIPHENHYDRAMINE HCL 50 MG/ML IJ SOLN
INTRAMUSCULAR | Status: AC
  Filled 2020-11-13: qty 1

## 2020-11-13 MED ORDER — DIPHENHYDRAMINE HCL 50 MG/ML IJ SOLN
25.0000 mg | Freq: Once | INTRAMUSCULAR | Status: AC
  Administered 2020-11-13: 25 mg via INTRAVENOUS

## 2020-11-13 MED ORDER — SODIUM CHLORIDE 0.9 % IV SOLN
150.0000 mg | Freq: Once | INTRAVENOUS | Status: AC
  Administered 2020-11-13: 150 mg via INTRAVENOUS
  Filled 2020-11-13: qty 150

## 2020-11-13 NOTE — Progress Notes (Signed)
Spoke with Dr. Julien Nordmann and would like to give calculated Carboplatin dose of 688 mg instead of previous 600 mg dose.  Larene Beach, PharmD

## 2020-11-13 NOTE — Telephone Encounter (Signed)
Scheduled appt per 12/30 los - pt to get an updated schedule next visit.

## 2020-11-13 NOTE — Progress Notes (Signed)
[place] Telephone:([phone]   [phone]  OFFICE PROGRESS NOTE  [name], DO [address] [place] [address]  DIAGNOSIS: Recurrent/progressive non-small cell lung cancer initially diagnosed as stage IIIA(T2a, N2, M0) non-small cell lung cancer, squamous cell carcinoma diagnosed in April 2021, presented with right upper lobe/suprahilar lung mass in addition to right paratracheal and subcarinal lymphadenopathy.  Molecular Studies by [employer]: No actionable mutations  PRIOR THERAPY:  1) Concurrent chemoradiation with weekly carboplatin for AUC of 2 and paclitaxel 45 NG/M2. First dose on Mar 31, 2020. Status post 7 cycles. Last dose was given on 05/12/2020 with partial response. 2) Consolidation immunotherapy with durvalumab 1500 mg IV every 4 weeks. First dose May 12, 2020.Status post 3 cycles.  CURRENT THERAPY: Systemic chemotherapy with carboplatin for an AUC of 5, paclitaxel 175 mg/m2, and Keytruda 200 mg IV every 3 weeks. First dose expected on 10/02/20.   Status post 2 cycles.  INTERVAL HISTORY: Jaxx Huish 59 y.o. male returns to the clinic today for follow-up visit accompanied by his wife.  The patient is feeling fine today with no concerning complaints except for mild fatigue and occasional shortness of breath.  He denied having any chest pain, cough or hemoptysis.  He denied having any fever or chills.  He has no nausea, vomiting, diarrhea or constipation.  He has mild skin rash on the face.  The patient denied having any headache or visual changes.  He is here today for evaluation before starting cycle #3 of his chemotherapy.   MEDICAL HISTORY: Past Medical History:  Diagnosis Date  . Asthma    as a child  . Diabetes ([place])   . Diverticulitis   . GERD (gastroesophageal reflux disease)   . High cholesterol   . History of kidney stones     ALLERGIES:  is allergic to sitagliptin.  MEDICATIONS:   Current Outpatient Medications  Medication Sig Dispense Refill  . acetaminophen (TYLENOL) 500 MG tablet Take 1,000 mg by mouth every 6 (six) hours as needed for mild pain.    Marland Kitchen albuterol (VENTOLIN HFA) 108 (90 Base) MCG/ACT inhaler Inhale 2 puffs into the lungs every 6 (six) hours as needed for wheezing or shortness of breath.     Marland Kitchen aspirin EC 81 MG tablet Take 81 mg by mouth at bedtime.     Marland Kitchen atorvastatin (LIPITOR) 40 MG tablet TAKE 1 TABLET DAILY (Patient taking differently: Take 40 mg by mouth at bedtime. ) 90 tablet 1  . calcium carbonate (TUMS - DOSED IN MG ELEMENTAL CALCIUM) 500 MG chewable tablet Chew 2 tablets by mouth 3 (three) times daily as needed for indigestion or heartburn.     . docusate sodium (COLACE) 100 MG capsule Take 100 mg by mouth daily.    Marland Kitchen glipiZIDE (GLUCOTROL) 5 MG tablet Take 1 tablet (5 mg total) by mouth 2 (two) times daily before a meal. 180 tablet 3  . lidocaine-prilocaine (EMLA) cream Apply 1 application topically as needed. 30 g 2  . meloxicam (MOBIC) 15 MG tablet TAKE 1 TABLET DAILY (Patient taking differently: Take 15 mg by mouth daily. ) 90 tablet 3  . metFORMIN (GLUCOPHAGE) 1000 MG tablet TAKE 1 TABLET TWICE A DAY WITH MEALS (Patient taking differently: Take 500 mg by mouth 2 (two) times daily with a meal. ) 180 tablet 3  . OneTouch Delica Lancets 16W MISC As directed up to qid 100 each 99  . ONETOUCH ULTRA test  strip USE AS INSTRUCTED UP TO FOUR TIMES A DAY 100 strip 13  . oxyCODONE-acetaminophen (PERCOCET/ROXICET) 5-325 MG tablet Take 1 tablet by mouth every 8 (eight) hours as needed for severe pain. 30 tablet 0  . prochlorperazine (COMPAZINE) 10 MG tablet Take 1 tablet (10 mg total) by mouth every 6 (six) hours as needed for nausea or vomiting. 30 tablet 2  . trolamine salicylate (ASPERCREME) 10 % cream Apply 1 application topically 2 (two) times daily as needed for muscle pain.      No current facility-administered medications for this visit.     SURGICAL HISTORY:  Past Surgical History:  Procedure Laterality Date  . broken bone repair    . BRONCHIAL BIOPSY  03/11/2020   Procedure: BRONCHIAL BIOPSIES;  Surgeon: Garner Nash, DO;  Location: [facility];  Service: Pulmonary;;  . COLONOSCOPY    . CRYOTHERAPY  03/11/2020   Procedure: CRYOTHERAPY;  Surgeon: Garner Nash, DO;  Location: [facility];  Service: Pulmonary;;  . FINE NEEDLE ASPIRATION  03/11/2020   Procedure: FINE NEEDLE ASPIRATION (FNA) LINEAR;  Surgeon: Garner Nash, DO;  Location: [facility];  Service: Pulmonary;;  . IR IMAGING GUIDED PORT INSERTION  10/16/2020  . KNEE ARTHROSCOPY WITH ANTERIOR CRUCIATE LIGAMENT (ACL) REPAIR     x 2  . VIDEO BRONCHOSCOPY WITH ENDOBRONCHIAL ULTRASOUND N/A 03/11/2020   Procedure: VIDEO BRONCHOSCOPY WITH ENDOBRONCHIAL ULTRASOUND;  Surgeon: Garner Nash, DO;  Location: [facility];  Service: Pulmonary;  Laterality: N/A;    REVIEW OF SYSTEMS:  A comprehensive review of systems was negative except for: Constitutional: positive for fatigue Respiratory: positive for dyspnea on exertion Integument/breast: positive for rash   PHYSICAL EXAMINATION: General appearance: alert, cooperative, fatigued and no distress Head: Normocephalic, without obvious abnormality, atraumatic Neck: no adenopathy, no JVD, supple, symmetrical, trachea midline and thyroid not enlarged, symmetric, no tenderness/mass/nodules Lymph nodes: Cervical, supraclavicular, and axillary nodes normal. Resp: clear to auscultation bilaterally Back: symmetric, no curvature. ROM normal. No CVA tenderness. Cardio: regular rate and rhythm, S1, S2 normal, no murmur, click, rub or gallop GI: soft, non-tender; bowel sounds normal; no masses,  no organomegaly Extremities: extremities normal, atraumatic, no cyanosis or edema  ECOG PERFORMANCE STATUS: 1 - Symptomatic but completely ambulatory  Blood pressure 120/74, pulse (!) 104, temperature 97.7 F (36.5 C),  temperature source Tympanic, resp. rate 20, height 6' (1.829 m), weight 248 lb (112.5 kg), SpO2 98 %.  LABORATORY DATA: Lab Results  Component Value Date   WBC 8.7 11/13/2020   HGB 10.2 (L) 11/13/2020   HCT 31.0 (L) 11/13/2020   MCV 83.8 11/13/2020   PLT 172 11/13/2020      Chemistry      Component Value Date/Time   NA 137 11/05/2020 0745   K 4.1 11/05/2020 0745   CL 101 11/05/2020 0745   CO2 25 11/05/2020 0745   BUN 16 11/05/2020 0745   CREATININE 1.06 11/05/2020 0745   CREATININE 1.10 01/14/2020 0859      Component Value Date/Time   CALCIUM 10.2 11/05/2020 0745   ALKPHOS 173 (H) 11/05/2020 0745   AST 11 (L) 11/05/2020 0745   ALT 17 11/05/2020 0745   BILITOT 0.3 11/05/2020 0745       RADIOGRAPHIC STUDIES: IR IMAGING GUIDED PORT INSERTION  Result Date: 10/16/2020 INDICATION: History of metastatic lung cancer. In need of durable intravenous access for chemotherapy administration Given the presence of right-sided disease as well as hypermetabolic right cervical lymphadenopathy, the decision was made to place a  left internal jugular approach port a catheter. EXAM: IMPLANTED PORT A CATH PLACEMENT WITH ULTRASOUND AND FLUOROSCOPIC GUIDANCE COMPARISON:  PET-CT-09/16/2020 MEDICATIONS: Ancef 2 gm IV; The antibiotic was administered within an appropriate time interval prior to skin puncture. ANESTHESIA/SEDATION: Moderate (conscious) sedation was employed during this procedure. A total of Versed 2 mg and Fentanyl 100 mcg was administered intravenously. Moderate Sedation Time: 24 minutes. The patient's level of consciousness and vital signs were monitored continuously by radiology nursing throughout the procedure under my direct supervision. CONTRAST:  None FLUOROSCOPY TIME:  54 seconds (11 mGy) COMPLICATIONS: None immediate. PROCEDURE: The procedure, risks, benefits, and alternatives were explained to the patient. Questions regarding the procedure were encouraged and answered. The patient  understands and consents to the procedure. The left neck and chest were prepped with chlorhexidine in a sterile fashion, and a sterile drape was applied covering the operative field. Maximum barrier sterile technique with sterile gowns and gloves were used for the procedure. A timeout was performed prior to the initiation of the procedure. Local anesthesia was provided with 1% lidocaine with epinephrine. After creating a small venotomy incision, a micropuncture kit was utilized to access the internal jugular vein. Real-time ultrasound guidance was utilized for vascular access including the acquisition of a permanent ultrasound image documenting patency of the accessed vessel. The microwire was utilized to measure appropriate catheter length. A subcutaneous port pocket was then created along the upper chest wall utilizing a combination of sharp and blunt dissection. The pocket was irrigated with sterile saline. A single lumen "ISP" sized power injectable port was chosen for placement. The 8 Fr catheter was tunneled from the port pocket site to the venotomy incision. The port was placed in the pocket. The external catheter was trimmed to appropriate length. At the venotomy, an 8 Fr peel-away sheath was placed over a guidewire under fluoroscopic guidance. The catheter was then placed through the sheath and the sheath was removed. Final catheter positioning was confirmed and documented with a fluoroscopic spot radiograph. The port was accessed with a Huber needle, aspirated and flushed with heparinized saline. The venotomy site was closed with an interrupted 4-0 Vicryl suture. The port pocket incision was closed with interrupted 2-0 Vicryl suture. Dermabond and Steri-strips were applied to both incisions. Dressings were applied. The patient tolerated the procedure well without immediate post procedural complication. FINDINGS: After catheter placement, the tip lies within the superior cavoatrial junction. The catheter  aspirates and flushes normally and is ready for immediate use. IMPRESSION: Successful placement of a left internal jugular approach power injectable Port-A-Cath. The catheter is ready for immediate use. Electronically Signed   By: Sandi Mariscal M.D.   On: 10/16/2020 10:20    ASSESSMENT AND PLAN: This is a 59 years old white male was recently diagnosed with stage IIIa non-small cell lung cancer, squamous cell carcinoma in April 2021 presented with right upper lobe/suprahilar lung mass in addition to right paratracheal and subcarinal lymphadenopathy. The patient completed a course of concurrent chemoradiation with weekly carboplatin for AUC of 2 and paclitaxel 45 MG/M2.  He is status post 7 cycles.  He tolerated the previous course of his treatment well except for mild dysphagia and odynophagia. He underwent consolidation treatment with immunotherapy with Imfinzi 1500 mg IV every 4 weeks.  Status post 3 cycles.  The patient tolerated the treatment well but unfortunately he has evidence for disease progression after cycle #3. We started the patient on first-line treatment with chemotherapy with carboplatin for AUC of 5, paclitaxel  175 mg/M2 and Keytruda 200 mg IV every 3 weeks.  Status post 2 cycles.  He has been tolerating this treatment well with no concerning adverse effects except for mild fatigue. I recommended for him to proceed with cycle #3 today as planned. I will see him back for follow-up visit in 3 weeks for evaluation after repeating CT scan of the chest, abdomen pelvis for restaging of his disease. For the skin rash he will apply over-the-counter hydrocortisone cream to these areas. The patient was advised to call immediately if he has any concerning symptoms in the interval. The patient voices understanding of current disease status and treatment options and is in agreement with the current care plan. All questions were answered. The patient knows to call the clinic with any problems, questions  or concerns. We can certainly see the patient much sooner if necessary.  Disclaimer: This note was dictated with voice recognition software. Similar sounding words can inadvertently be transcribed and may not be corrected upon review.

## 2020-11-14 DIAGNOSIS — E785 Hyperlipidemia, unspecified: Secondary | ICD-10-CM | POA: Diagnosis not present

## 2020-11-14 DIAGNOSIS — W260XXA Contact with knife, initial encounter: Secondary | ICD-10-CM | POA: Diagnosis not present

## 2020-11-14 DIAGNOSIS — E119 Type 2 diabetes mellitus without complications: Secondary | ICD-10-CM | POA: Diagnosis not present

## 2020-11-14 DIAGNOSIS — G8911 Acute pain due to trauma: Secondary | ICD-10-CM | POA: Diagnosis not present

## 2020-11-14 DIAGNOSIS — F17200 Nicotine dependence, unspecified, uncomplicated: Secondary | ICD-10-CM | POA: Diagnosis not present

## 2020-11-14 DIAGNOSIS — K219 Gastro-esophageal reflux disease without esophagitis: Secondary | ICD-10-CM | POA: Diagnosis not present

## 2020-11-14 DIAGNOSIS — S61210A Laceration without foreign body of right index finger without damage to nail, initial encounter: Secondary | ICD-10-CM | POA: Diagnosis not present

## 2020-11-14 DIAGNOSIS — Z888 Allergy status to other drugs, medicaments and biological substances status: Secondary | ICD-10-CM | POA: Diagnosis not present

## 2020-11-14 DIAGNOSIS — Z85118 Personal history of other malignant neoplasm of bronchus and lung: Secondary | ICD-10-CM | POA: Diagnosis not present

## 2020-11-14 DIAGNOSIS — Z79899 Other long term (current) drug therapy: Secondary | ICD-10-CM | POA: Diagnosis not present

## 2020-11-14 DIAGNOSIS — Z7984 Long term (current) use of oral hypoglycemic drugs: Secondary | ICD-10-CM | POA: Diagnosis not present

## 2020-11-14 DIAGNOSIS — Z7982 Long term (current) use of aspirin: Secondary | ICD-10-CM | POA: Diagnosis not present

## 2020-11-15 ENCOUNTER — Inpatient Hospital Stay: Payer: BC Managed Care – PPO | Attending: Internal Medicine

## 2020-11-15 ENCOUNTER — Other Ambulatory Visit: Payer: Self-pay

## 2020-11-15 VITALS — BP 109/66 | HR 87 | Temp 97.0°F | Resp 18

## 2020-11-15 DIAGNOSIS — Z5189 Encounter for other specified aftercare: Secondary | ICD-10-CM | POA: Diagnosis not present

## 2020-11-15 DIAGNOSIS — E119 Type 2 diabetes mellitus without complications: Secondary | ICD-10-CM | POA: Diagnosis not present

## 2020-11-15 DIAGNOSIS — Z87442 Personal history of urinary calculi: Secondary | ICD-10-CM | POA: Diagnosis not present

## 2020-11-15 DIAGNOSIS — K219 Gastro-esophageal reflux disease without esophagitis: Secondary | ICD-10-CM | POA: Insufficient documentation

## 2020-11-15 DIAGNOSIS — Z5112 Encounter for antineoplastic immunotherapy: Secondary | ICD-10-CM | POA: Insufficient documentation

## 2020-11-15 DIAGNOSIS — Z7984 Long term (current) use of oral hypoglycemic drugs: Secondary | ICD-10-CM | POA: Diagnosis not present

## 2020-11-15 DIAGNOSIS — E7800 Pure hypercholesterolemia, unspecified: Secondary | ICD-10-CM | POA: Diagnosis not present

## 2020-11-15 DIAGNOSIS — Z7982 Long term (current) use of aspirin: Secondary | ICD-10-CM | POA: Diagnosis not present

## 2020-11-15 DIAGNOSIS — C3411 Malignant neoplasm of upper lobe, right bronchus or lung: Secondary | ICD-10-CM | POA: Diagnosis not present

## 2020-11-15 DIAGNOSIS — R059 Cough, unspecified: Secondary | ICD-10-CM | POA: Insufficient documentation

## 2020-11-15 DIAGNOSIS — Z79899 Other long term (current) drug therapy: Secondary | ICD-10-CM | POA: Diagnosis not present

## 2020-11-15 DIAGNOSIS — Z791 Long term (current) use of non-steroidal anti-inflammatories (NSAID): Secondary | ICD-10-CM | POA: Diagnosis not present

## 2020-11-15 DIAGNOSIS — R5383 Other fatigue: Secondary | ICD-10-CM | POA: Diagnosis not present

## 2020-11-15 DIAGNOSIS — Z5111 Encounter for antineoplastic chemotherapy: Secondary | ICD-10-CM | POA: Diagnosis not present

## 2020-11-15 MED ORDER — PEGFILGRASTIM-CBQV 6 MG/0.6ML ~~LOC~~ SOSY
6.0000 mg | PREFILLED_SYRINGE | Freq: Once | SUBCUTANEOUS | Status: AC
  Administered 2020-11-15: 6 mg via SUBCUTANEOUS

## 2020-11-15 NOTE — Patient Instructions (Signed)

## 2020-11-18 ENCOUNTER — Telehealth: Payer: Self-pay

## 2020-11-18 NOTE — Telephone Encounter (Signed)
Weekly labs added per inbasket message...William Farrell

## 2020-11-19 ENCOUNTER — Other Ambulatory Visit: Payer: Self-pay

## 2020-11-19 ENCOUNTER — Inpatient Hospital Stay: Payer: BC Managed Care – PPO

## 2020-11-19 DIAGNOSIS — Z87442 Personal history of urinary calculi: Secondary | ICD-10-CM | POA: Diagnosis not present

## 2020-11-19 DIAGNOSIS — Z5111 Encounter for antineoplastic chemotherapy: Secondary | ICD-10-CM | POA: Diagnosis not present

## 2020-11-19 DIAGNOSIS — Z791 Long term (current) use of non-steroidal anti-inflammatories (NSAID): Secondary | ICD-10-CM | POA: Diagnosis not present

## 2020-11-19 DIAGNOSIS — C3411 Malignant neoplasm of upper lobe, right bronchus or lung: Secondary | ICD-10-CM

## 2020-11-19 DIAGNOSIS — Z5112 Encounter for antineoplastic immunotherapy: Secondary | ICD-10-CM | POA: Diagnosis not present

## 2020-11-19 DIAGNOSIS — Z79899 Other long term (current) drug therapy: Secondary | ICD-10-CM | POA: Diagnosis not present

## 2020-11-19 DIAGNOSIS — Z5189 Encounter for other specified aftercare: Secondary | ICD-10-CM | POA: Diagnosis not present

## 2020-11-19 DIAGNOSIS — R5383 Other fatigue: Secondary | ICD-10-CM | POA: Diagnosis not present

## 2020-11-19 DIAGNOSIS — Z7984 Long term (current) use of oral hypoglycemic drugs: Secondary | ICD-10-CM | POA: Diagnosis not present

## 2020-11-19 DIAGNOSIS — Z7982 Long term (current) use of aspirin: Secondary | ICD-10-CM | POA: Diagnosis not present

## 2020-11-19 DIAGNOSIS — R059 Cough, unspecified: Secondary | ICD-10-CM | POA: Diagnosis not present

## 2020-11-19 DIAGNOSIS — E119 Type 2 diabetes mellitus without complications: Secondary | ICD-10-CM | POA: Diagnosis not present

## 2020-11-19 DIAGNOSIS — E7800 Pure hypercholesterolemia, unspecified: Secondary | ICD-10-CM | POA: Diagnosis not present

## 2020-11-19 DIAGNOSIS — K219 Gastro-esophageal reflux disease without esophagitis: Secondary | ICD-10-CM | POA: Diagnosis not present

## 2020-11-19 LAB — CBC WITH DIFFERENTIAL (CANCER CENTER ONLY)
Abs Immature Granulocytes: 0.25 10*3/uL — ABNORMAL HIGH (ref 0.00–0.07)
Basophils Absolute: 0.1 10*3/uL (ref 0.0–0.1)
Basophils Relative: 1 %
Eosinophils Absolute: 0.2 10*3/uL (ref 0.0–0.5)
Eosinophils Relative: 1 %
HCT: 31.9 % — ABNORMAL LOW (ref 39.0–52.0)
Hemoglobin: 10.6 g/dL — ABNORMAL LOW (ref 13.0–17.0)
Immature Granulocytes: 2 %
Lymphocytes Relative: 7 %
Lymphs Abs: 1 10*3/uL (ref 0.7–4.0)
MCH: 28.2 pg (ref 26.0–34.0)
MCHC: 33.2 g/dL (ref 30.0–36.0)
MCV: 84.8 fL (ref 80.0–100.0)
Monocytes Absolute: 0.8 10*3/uL (ref 0.1–1.0)
Monocytes Relative: 5 %
Neutro Abs: 13.2 10*3/uL — ABNORMAL HIGH (ref 1.7–7.7)
Neutrophils Relative %: 84 %
Platelet Count: 171 10*3/uL (ref 150–400)
RBC: 3.76 MIL/uL — ABNORMAL LOW (ref 4.22–5.81)
RDW: 17.8 % — ABNORMAL HIGH (ref 11.5–15.5)
WBC Count: 15.6 10*3/uL — ABNORMAL HIGH (ref 4.0–10.5)
nRBC: 0 % (ref 0.0–0.2)

## 2020-11-19 LAB — CMP (CANCER CENTER ONLY)
ALT: 23 U/L (ref 0–44)
AST: 15 U/L (ref 15–41)
Albumin: 4 g/dL (ref 3.5–5.0)
Alkaline Phosphatase: 170 U/L — ABNORMAL HIGH (ref 38–126)
Anion gap: 9 (ref 5–15)
BUN: 15 mg/dL (ref 6–20)
CO2: 25 mmol/L (ref 22–32)
Calcium: 9.6 mg/dL (ref 8.9–10.3)
Chloride: 101 mmol/L (ref 98–111)
Creatinine: 0.98 mg/dL (ref 0.61–1.24)
GFR, Estimated: >60 mL/min (ref >60)
Glucose, Bld: 258 mg/dL — ABNORMAL HIGH (ref 70–99)
Potassium: 4.6 mmol/L (ref 3.5–5.1)
Sodium: 135 mmol/L (ref 135–145)
Total Bilirubin: 0.4 mg/dL (ref 0.3–1.2)
Total Protein: 6.9 g/dL (ref 6.5–8.1)

## 2020-11-19 LAB — TSH: TSH: 4.181 u[IU]/mL — ABNORMAL HIGH (ref 0.320–4.118)

## 2020-11-26 ENCOUNTER — Other Ambulatory Visit: Payer: Self-pay

## 2020-11-26 ENCOUNTER — Telehealth: Payer: Self-pay

## 2020-11-26 ENCOUNTER — Inpatient Hospital Stay: Payer: BC Managed Care – PPO

## 2020-11-26 DIAGNOSIS — K219 Gastro-esophageal reflux disease without esophagitis: Secondary | ICD-10-CM | POA: Diagnosis not present

## 2020-11-26 DIAGNOSIS — E7800 Pure hypercholesterolemia, unspecified: Secondary | ICD-10-CM | POA: Diagnosis not present

## 2020-11-26 DIAGNOSIS — Z5111 Encounter for antineoplastic chemotherapy: Secondary | ICD-10-CM | POA: Diagnosis not present

## 2020-11-26 DIAGNOSIS — R059 Cough, unspecified: Secondary | ICD-10-CM | POA: Diagnosis not present

## 2020-11-26 DIAGNOSIS — Z7982 Long term (current) use of aspirin: Secondary | ICD-10-CM | POA: Diagnosis not present

## 2020-11-26 DIAGNOSIS — E119 Type 2 diabetes mellitus without complications: Secondary | ICD-10-CM | POA: Diagnosis not present

## 2020-11-26 DIAGNOSIS — Z7984 Long term (current) use of oral hypoglycemic drugs: Secondary | ICD-10-CM | POA: Diagnosis not present

## 2020-11-26 DIAGNOSIS — C3411 Malignant neoplasm of upper lobe, right bronchus or lung: Secondary | ICD-10-CM | POA: Diagnosis not present

## 2020-11-26 DIAGNOSIS — Z87442 Personal history of urinary calculi: Secondary | ICD-10-CM | POA: Diagnosis not present

## 2020-11-26 DIAGNOSIS — Z79899 Other long term (current) drug therapy: Secondary | ICD-10-CM | POA: Diagnosis not present

## 2020-11-26 DIAGNOSIS — Z5112 Encounter for antineoplastic immunotherapy: Secondary | ICD-10-CM | POA: Diagnosis not present

## 2020-11-26 DIAGNOSIS — Z791 Long term (current) use of non-steroidal anti-inflammatories (NSAID): Secondary | ICD-10-CM | POA: Diagnosis not present

## 2020-11-26 DIAGNOSIS — R5383 Other fatigue: Secondary | ICD-10-CM | POA: Diagnosis not present

## 2020-11-26 DIAGNOSIS — Z5189 Encounter for other specified aftercare: Secondary | ICD-10-CM | POA: Diagnosis not present

## 2020-11-26 LAB — CBC WITH DIFFERENTIAL (CANCER CENTER ONLY)
Abs Immature Granulocytes: 0.1 10*3/uL — ABNORMAL HIGH (ref 0.00–0.07)
Basophils Absolute: 0.1 10*3/uL (ref 0.0–0.1)
Basophils Relative: 1 %
Eosinophils Absolute: 0.2 10*3/uL (ref 0.0–0.5)
Eosinophils Relative: 2 %
HCT: 31.3 % — ABNORMAL LOW (ref 39.0–52.0)
Hemoglobin: 10.4 g/dL — ABNORMAL LOW (ref 13.0–17.0)
Immature Granulocytes: 1 %
Lymphocytes Relative: 11 %
Lymphs Abs: 1.2 10*3/uL (ref 0.7–4.0)
MCH: 28.7 pg (ref 26.0–34.0)
MCHC: 33.2 g/dL (ref 30.0–36.0)
MCV: 86.2 fL (ref 80.0–100.0)
Monocytes Absolute: 0.8 10*3/uL (ref 0.1–1.0)
Monocytes Relative: 7 %
Neutro Abs: 8.9 10*3/uL — ABNORMAL HIGH (ref 1.7–7.7)
Neutrophils Relative %: 78 %
Platelet Count: 150 10*3/uL (ref 150–400)
RBC: 3.63 MIL/uL — ABNORMAL LOW (ref 4.22–5.81)
RDW: 18.8 % — ABNORMAL HIGH (ref 11.5–15.5)
WBC Count: 11.3 10*3/uL — ABNORMAL HIGH (ref 4.0–10.5)
nRBC: 0 % (ref 0.0–0.2)

## 2020-11-26 LAB — CMP (CANCER CENTER ONLY)
ALT: 20 U/L (ref 0–44)
AST: 12 U/L — ABNORMAL LOW (ref 15–41)
Albumin: 4.3 g/dL (ref 3.5–5.0)
Alkaline Phosphatase: 195 U/L — ABNORMAL HIGH (ref 38–126)
Anion gap: 9 (ref 5–15)
BUN: 16 mg/dL (ref 6–20)
CO2: 26 mmol/L (ref 22–32)
Calcium: 9.8 mg/dL (ref 8.9–10.3)
Chloride: 102 mmol/L (ref 98–111)
Creatinine: 1.04 mg/dL (ref 0.61–1.24)
GFR, Estimated: >60 mL/min (ref >60)
Glucose, Bld: 292 mg/dL — ABNORMAL HIGH (ref 70–99)
Potassium: 4.6 mmol/L (ref 3.5–5.1)
Sodium: 137 mmol/L (ref 135–145)
Total Bilirubin: 0.6 mg/dL (ref 0.3–1.2)
Total Protein: 7 g/dL (ref 6.5–8.1)

## 2020-11-26 LAB — TSH: TSH: 5.312 u[IU]/mL — ABNORMAL HIGH (ref 0.320–4.118)

## 2020-11-26 NOTE — Telephone Encounter (Signed)
Pt called wanting to schedule his CT scan. I have provided him with the phone number to Radiology Scheduling. He expressed understanding of this information.

## 2020-11-27 ENCOUNTER — Telehealth: Payer: Self-pay | Admitting: Medical Oncology

## 2020-11-27 ENCOUNTER — Encounter: Payer: Self-pay | Admitting: Medical Oncology

## 2020-11-27 ENCOUNTER — Other Ambulatory Visit: Payer: Self-pay | Admitting: Osteopathic Medicine

## 2020-11-27 NOTE — Telephone Encounter (Signed)
err

## 2020-11-27 NOTE — Telephone Encounter (Signed)
Ct scan abd/pelvis-He noticed Dr Julien Nordmann ordered CT scan abdomen and pelvis with his chest .He was not informed about these.  Is there a problems with PET results?  What is rationale to add abd/pelvis ? Should he be concerned?

## 2020-11-27 NOTE — Telephone Encounter (Signed)
[facility] [place] requesting med refill. Rx not listed in active med list.

## 2020-11-27 NOTE — Telephone Encounter (Signed)
Per Dr Julien Nordmann . I told pt the CT abd/pelvis is precautionary .

## 2020-12-02 ENCOUNTER — Encounter: Payer: Self-pay | Admitting: Internal Medicine

## 2020-12-02 ENCOUNTER — Other Ambulatory Visit: Payer: BC Managed Care – PPO

## 2020-12-03 ENCOUNTER — Ambulatory Visit (INDEPENDENT_AMBULATORY_CARE_PROVIDER_SITE_OTHER): Payer: BC Managed Care – PPO

## 2020-12-03 ENCOUNTER — Other Ambulatory Visit: Payer: BC Managed Care – PPO

## 2020-12-03 ENCOUNTER — Other Ambulatory Visit: Payer: Self-pay

## 2020-12-03 DIAGNOSIS — N1330 Unspecified hydronephrosis: Secondary | ICD-10-CM | POA: Diagnosis not present

## 2020-12-03 DIAGNOSIS — C3490 Malignant neoplasm of unspecified part of unspecified bronchus or lung: Secondary | ICD-10-CM

## 2020-12-03 DIAGNOSIS — K4020 Bilateral inguinal hernia, without obstruction or gangrene, not specified as recurrent: Secondary | ICD-10-CM | POA: Diagnosis not present

## 2020-12-03 DIAGNOSIS — I700 Atherosclerosis of aorta: Secondary | ICD-10-CM | POA: Diagnosis not present

## 2020-12-03 DIAGNOSIS — J984 Other disorders of lung: Secondary | ICD-10-CM | POA: Diagnosis not present

## 2020-12-03 DIAGNOSIS — K5730 Diverticulosis of large intestine without perforation or abscess without bleeding: Secondary | ICD-10-CM | POA: Diagnosis not present

## 2020-12-03 DIAGNOSIS — R918 Other nonspecific abnormal finding of lung field: Secondary | ICD-10-CM | POA: Diagnosis not present

## 2020-12-03 MED ORDER — IOHEXOL 300 MG/ML  SOLN
100.0000 mL | Freq: Once | INTRAMUSCULAR | Status: AC | PRN
  Administered 2020-12-03: 100 mL via INTRAVENOUS

## 2020-12-04 ENCOUNTER — Inpatient Hospital Stay: Payer: BC Managed Care – PPO

## 2020-12-04 ENCOUNTER — Inpatient Hospital Stay: Payer: BC Managed Care – PPO | Admitting: Internal Medicine

## 2020-12-04 ENCOUNTER — Encounter: Payer: Self-pay | Admitting: Medical Oncology

## 2020-12-04 ENCOUNTER — Other Ambulatory Visit: Payer: Self-pay

## 2020-12-04 VITALS — BP 124/75 | HR 98 | Temp 97.3°F | Resp 18 | Ht 72.0 in | Wt 250.0 lb

## 2020-12-04 DIAGNOSIS — C3411 Malignant neoplasm of upper lobe, right bronchus or lung: Secondary | ICD-10-CM

## 2020-12-04 DIAGNOSIS — Z7982 Long term (current) use of aspirin: Secondary | ICD-10-CM | POA: Diagnosis not present

## 2020-12-04 DIAGNOSIS — Z79899 Other long term (current) drug therapy: Secondary | ICD-10-CM | POA: Diagnosis not present

## 2020-12-04 DIAGNOSIS — R059 Cough, unspecified: Secondary | ICD-10-CM | POA: Diagnosis not present

## 2020-12-04 DIAGNOSIS — E7800 Pure hypercholesterolemia, unspecified: Secondary | ICD-10-CM | POA: Diagnosis not present

## 2020-12-04 DIAGNOSIS — E119 Type 2 diabetes mellitus without complications: Secondary | ICD-10-CM | POA: Diagnosis not present

## 2020-12-04 DIAGNOSIS — Z5189 Encounter for other specified aftercare: Secondary | ICD-10-CM | POA: Diagnosis not present

## 2020-12-04 DIAGNOSIS — Z87442 Personal history of urinary calculi: Secondary | ICD-10-CM | POA: Diagnosis not present

## 2020-12-04 DIAGNOSIS — Z5112 Encounter for antineoplastic immunotherapy: Secondary | ICD-10-CM | POA: Diagnosis not present

## 2020-12-04 DIAGNOSIS — K219 Gastro-esophageal reflux disease without esophagitis: Secondary | ICD-10-CM | POA: Diagnosis not present

## 2020-12-04 DIAGNOSIS — Z5111 Encounter for antineoplastic chemotherapy: Secondary | ICD-10-CM | POA: Diagnosis not present

## 2020-12-04 DIAGNOSIS — Z95828 Presence of other vascular implants and grafts: Secondary | ICD-10-CM | POA: Insufficient documentation

## 2020-12-04 DIAGNOSIS — R5383 Other fatigue: Secondary | ICD-10-CM | POA: Diagnosis not present

## 2020-12-04 DIAGNOSIS — Z7984 Long term (current) use of oral hypoglycemic drugs: Secondary | ICD-10-CM | POA: Diagnosis not present

## 2020-12-04 DIAGNOSIS — Z791 Long term (current) use of non-steroidal anti-inflammatories (NSAID): Secondary | ICD-10-CM | POA: Diagnosis not present

## 2020-12-04 LAB — CBC WITH DIFFERENTIAL (CANCER CENTER ONLY)
Abs Immature Granulocytes: 0.04 10*3/uL (ref 0.00–0.07)
Basophils Absolute: 0.1 10*3/uL (ref 0.0–0.1)
Basophils Relative: 1 %
Eosinophils Absolute: 0.1 10*3/uL (ref 0.0–0.5)
Eosinophils Relative: 1 %
HCT: 30.1 % — ABNORMAL LOW (ref 39.0–52.0)
Hemoglobin: 10.2 g/dL — ABNORMAL LOW (ref 13.0–17.0)
Immature Granulocytes: 0 %
Lymphocytes Relative: 10 %
Lymphs Abs: 0.9 10*3/uL (ref 0.7–4.0)
MCH: 28.8 pg (ref 26.0–34.0)
MCHC: 33.9 g/dL (ref 30.0–36.0)
MCV: 85 fL (ref 80.0–100.0)
Monocytes Absolute: 0.9 10*3/uL (ref 0.1–1.0)
Monocytes Relative: 10 %
Neutro Abs: 6.9 10*3/uL (ref 1.7–7.7)
Neutrophils Relative %: 78 %
Platelet Count: 166 10*3/uL (ref 150–400)
RBC: 3.54 MIL/uL — ABNORMAL LOW (ref 4.22–5.81)
RDW: 19.3 % — ABNORMAL HIGH (ref 11.5–15.5)
WBC Count: 9 10*3/uL (ref 4.0–10.5)
nRBC: 0 % (ref 0.0–0.2)

## 2020-12-04 LAB — CMP (CANCER CENTER ONLY)
ALT: 20 U/L (ref 0–44)
AST: 14 U/L — ABNORMAL LOW (ref 15–41)
Albumin: 3.6 g/dL (ref 3.5–5.0)
Alkaline Phosphatase: 176 U/L — ABNORMAL HIGH (ref 38–126)
Anion gap: 10 (ref 5–15)
BUN: 13 mg/dL (ref 6–20)
CO2: 22 mmol/L (ref 22–32)
Calcium: 9 mg/dL (ref 8.9–10.3)
Chloride: 105 mmol/L (ref 98–111)
Creatinine: 1.23 mg/dL (ref 0.61–1.24)
GFR, Estimated: >60 mL/min (ref >60)
Glucose, Bld: 315 mg/dL — ABNORMAL HIGH (ref 70–99)
Potassium: 4 mmol/L (ref 3.5–5.1)
Sodium: 137 mmol/L (ref 135–145)
Total Bilirubin: 0.4 mg/dL (ref 0.3–1.2)
Total Protein: 7.2 g/dL (ref 6.5–8.1)

## 2020-12-04 MED ORDER — HEPARIN SOD (PORK) LOCK FLUSH 100 UNIT/ML IV SOLN
500.0000 [IU] | Freq: Once | INTRAVENOUS | Status: AC
  Administered 2020-12-04: 500 [IU]
  Filled 2020-12-04: qty 5

## 2020-12-04 MED ORDER — DEXAMETHASONE 4 MG PO TABS: ORAL_TABLET | ORAL | 0 refills | Status: DC

## 2020-12-04 MED ORDER — OXYCODONE-ACETAMINOPHEN 5-325 MG PO TABS: 1.0000 | ORAL_TABLET | Freq: Three times a day (TID) | ORAL | 0 refills | Status: DC | PRN

## 2020-12-04 MED ORDER — SODIUM CHLORIDE 0.9% FLUSH
10.0000 mL | Freq: Once | INTRAVENOUS | Status: AC
  Administered 2020-12-04: 10 mL
  Filled 2020-12-04: qty 10

## 2020-12-04 NOTE — Progress Notes (Signed)
[place] [phone]) [phone]   [phone]) [phone]  OFFICE PROGRESS NOTE  [name], DO [address] [place] [address]  DIAGNOSIS: Recurrent/progressive non-small cell lung cancer initially diagnosed as stage IIIA(T2a, N2, M0) non-small cell lung cancer, squamous cell carcinoma diagnosed in April 2021, presented with right upper lobe/suprahilar lung mass in addition to right paratracheal and subcarinal lymphadenopathy.  Molecular Studies by [employer]: No actionable mutations  PRIOR THERAPY:  1) Concurrent chemoradiation with weekly carboplatin for AUC of 2 and paclitaxel 45 NG/M2. First dose on Mar 31, 2020. Status post 7 cycles. Last dose was given on 05/12/2020 with partial response. 2) Consolidation immunotherapy with durvalumab 1500 mg IV every 4 weeks. First dose May 12, 2020.Status post 3 cycles. 3) Systemic chemotherapy with carboplatin for an AUC of 5, paclitaxel 175 mg/m2, and Keytruda 200 mg IV every 3 weeks. First dose expected on 10/02/20.   Status post 3 cycles.  Last dose was given on November 13, 2020.  CURRENT THERAPY:  Second line systemic chemotherapy with docetaxel 75 Mg/M2 and Cyramza 10 mg/KG every 3 weeks with Neulasta support.  First dose December 11, 2020.   INTERVAL HISTORY: William Farrell 60 y.o. male returns to the clinic today for follow-up visit accompanied by his wife.  The patient is feeling fine today with no concerning complaints except for occasional shortness of breath and cough.  He denied having any chest pain or hemoptysis.  He denied having any nausea, vomiting, diarrhea or constipation.  He denied having any recent weight loss or night sweats.  He has no fever or chills.  He has been tolerating his previous systemic chemotherapy with carboplatin, paclitaxel and Keytruda fairly well.  The patient had repeat CT scan of the chest, abdomen pelvis performed recently and he is here for  evaluation and discussion of his scan results and treatment options.  MEDICAL HISTORY: Past Medical History:  Diagnosis Date  . Asthma    as a child  . Diabetes ([place])   . Diverticulitis   . GERD (gastroesophageal reflux disease)   . High cholesterol   . History of kidney stones     ALLERGIES:  is allergic to sitagliptin.  MEDICATIONS:  Current Outpatient Medications  Medication Sig Dispense Refill  . acetaminophen (TYLENOL) 500 MG tablet Take 1,000 mg by mouth every 6 (six) hours as needed for mild pain.    Marland Kitchen albuterol (VENTOLIN HFA) 108 (90 Base) MCG/ACT inhaler Inhale 2 puffs into the lungs every 6 (six) hours as needed for wheezing or shortness of breath.     Marland Kitchen aspirin EC 81 MG tablet Take 81 mg by mouth at bedtime.     Marland Kitchen atorvastatin (LIPITOR) 40 MG tablet TAKE 1 TABLET DAILY (Patient taking differently: Take 40 mg by mouth at bedtime. ) 90 tablet 1  . calcium carbonate (TUMS - DOSED IN MG ELEMENTAL CALCIUM) 500 MG chewable tablet Chew 2 tablets by mouth 3 (three) times daily as needed for indigestion or heartburn.     . CHANTIX STARTING MONTH PAK 0.5 MG X 11 & 1 MG X 42 tablet TAKE ONE 0.5 MG TABLET BY MOUTH ONCE DAILY FOR 3 DAYS, THEN INCREASE TO ONE 0.5 MG TABLET TWICE DAILY FOR 4 DAYS, THEN INCREASE TO ONE 1 MG TABLET TWICE DAILY. 53 each 0  . docusate sodium (COLACE) 100 MG capsule Take 100 mg by mouth daily.    Marland Kitchen glipiZIDE (GLUCOTROL) 5 MG tablet Take 1  tablet (5 mg total) by mouth 2 (two) times daily before a meal. 180 tablet 3  . lidocaine-prilocaine (EMLA) cream Apply 1 application topically as needed. 30 g 2  . meloxicam (MOBIC) 15 MG tablet TAKE 1 TABLET DAILY (Patient taking differently: Take 15 mg by mouth daily. ) 90 tablet 3  . metFORMIN (GLUCOPHAGE) 1000 MG tablet TAKE 1 TABLET TWICE A DAY WITH MEALS (Patient taking differently: Take 500 mg by mouth 2 (two) times daily with a meal. ) 180 tablet 3  . OneTouch Delica Lancets 42A MISC As directed up to qid 100 each 99   . ONETOUCH ULTRA test strip USE AS INSTRUCTED UP TO FOUR TIMES A DAY 100 strip 13  . oxyCODONE-acetaminophen (PERCOCET/ROXICET) 5-325 MG tablet Take 1 tablet by mouth every 8 (eight) hours as needed for severe pain. 30 tablet 0  . prochlorperazine (COMPAZINE) 10 MG tablet Take 1 tablet (10 mg total) by mouth every 6 (six) hours as needed for nausea or vomiting. 30 tablet 2  . trolamine salicylate (ASPERCREME) 10 % cream Apply 1 application topically 2 (two) times daily as needed for muscle pain.      No current facility-administered medications for this visit.    SURGICAL HISTORY:  Past Surgical History:  Procedure Laterality Date  . broken bone repair    . BRONCHIAL BIOPSY  03/11/2020   Procedure: BRONCHIAL BIOPSIES;  Surgeon: Garner Nash, DO;  Location: [facility];  Service: Pulmonary;;  . COLONOSCOPY    . CRYOTHERAPY  03/11/2020   Procedure: CRYOTHERAPY;  Surgeon: Garner Nash, DO;  Location: [facility];  Service: Pulmonary;;  . FINE NEEDLE ASPIRATION  03/11/2020   Procedure: FINE NEEDLE ASPIRATION (FNA) LINEAR;  Surgeon: Garner Nash, DO;  Location: [facility];  Service: Pulmonary;;  . IR IMAGING GUIDED PORT INSERTION  10/16/2020  . KNEE ARTHROSCOPY WITH ANTERIOR CRUCIATE LIGAMENT (ACL) REPAIR     x 2  . VIDEO BRONCHOSCOPY WITH ENDOBRONCHIAL ULTRASOUND N/A 03/11/2020   Procedure: VIDEO BRONCHOSCOPY WITH ENDOBRONCHIAL ULTRASOUND;  Surgeon: Garner Nash, DO;  Location: [facility];  Service: Pulmonary;  Laterality: N/A;    REVIEW OF SYSTEMS:  Constitutional: positive for fatigue Eyes: negative Ears, nose, mouth, throat, and face: negative Respiratory: positive for cough Cardiovascular: negative Gastrointestinal: negative Genitourinary:negative Integument/breast: negative Hematologic/lymphatic: negative Musculoskeletal:negative Neurological: negative Behavioral/Psych: negative Endocrine: negative Allergic/Immunologic: negative   PHYSICAL EXAMINATION:  General appearance: alert, cooperative, fatigued and no distress Head: Normocephalic, without obvious abnormality, atraumatic Neck: no adenopathy, no JVD, supple, symmetrical, trachea midline and thyroid not enlarged, symmetric, no tenderness/mass/nodules Lymph nodes: Cervical, supraclavicular, and axillary nodes normal. Resp: clear to auscultation bilaterally Back: symmetric, no curvature. ROM normal. No CVA tenderness. Cardio: regular rate and rhythm, S1, S2 normal, no murmur, click, rub or gallop GI: soft, non-tender; bowel sounds normal; no masses,  no organomegaly Extremities: extremities normal, atraumatic, no cyanosis or edema Neurologic: Alert and oriented X 3, normal strength and tone. Normal symmetric reflexes. Normal coordination and gait  ECOG PERFORMANCE STATUS: 1 - Symptomatic but completely ambulatory  Blood pressure 124/75, pulse 98, temperature (!) 97.3 F (36.3 C), temperature source Tympanic, resp. rate 18, height 6' (1.829 m), weight 250 lb (113.4 kg), SpO2 98 %.  LABORATORY DATA: Lab Results  Component Value Date   WBC 11.3 (H) 11/26/2020   HGB 10.4 (L) 11/26/2020   HCT 31.3 (L) 11/26/2020   MCV 86.2 11/26/2020   PLT 150 11/26/2020      Chemistry  Component Value Date/Time   NA 137 11/26/2020 0737   K 4.6 11/26/2020 0737   CL 102 11/26/2020 0737   CO2 26 11/26/2020 0737   BUN 16 11/26/2020 0737   CREATININE 1.04 11/26/2020 0737   CREATININE 1.10 01/14/2020 0859      Component Value Date/Time   CALCIUM 9.8 11/26/2020 0737   ALKPHOS 195 (H) 11/26/2020 0737   AST 12 (L) 11/26/2020 0737   ALT 20 11/26/2020 0737   BILITOT 0.6 11/26/2020 0737       RADIOGRAPHIC STUDIES: CT Chest W Contrast  Result Date: 12/03/2020 CLINICAL DATA:  Non-small cell lung cancer staging in this 60 year old male found to have non-small cell lung cancer post chemo radiotherapy and immunotherapy, ongoing systemic therapy by report. EXAM: CT CHEST, ABDOMEN, AND PELVIS WITH  CONTRAST TECHNIQUE: Multidetector CT imaging of the chest, abdomen and pelvis was performed following the standard protocol during bolus administration of intravenous contrast. CONTRAST:  156mL OMNIPAQUE IOHEXOL 300 MG/ML  SOLN COMPARISON:  PET exam of September 16, 2020 FINDINGS: CT CHEST FINDINGS Cardiovascular: LEFT-sided Port-A-Cath terminates at the caval to atrial junction. Heart size is normal without sign of pericardial effusion. Aortic caliber is normal. Central pulmonary vasculature unremarkable on venous phase assessment. Mediastinum/Nodes: RIGHT subpectoral lymph node (image 6, series 2) 16 mm, previously 10 mm short axis RIGHT supraclavicular/lower neck lymph node (image 3, series 2) 16 mm previously approximately 14 mm short axis. RIGHT paratracheal lymph node (image 23, series 2) 13 mm short axis, previously 10 mm short axis. This is not a distinct lymph node but is stranding and soft tissue in the area of the RIGHT paratracheal chain. (Image 26, series 2) 12 mm subcarinal lymph node previously 7 mm. Consolidative changes about the RIGHT hilum contiguous with pulmonary lesions, see below. Lungs/Pleura: Enlarging RIGHT upper lobe pulmonary nodules, larger than in October of 2021. (Image 33, series 4) 16 mm RIGHT upper lobe pulmonary nodule previously 13 mm. Smaller nodule seen posterior to this area now nearly contiguous/confluency with the dominant upper lobe nodule now measuring 14 mm (image 30, series 4) previously 7 mm, measurements from October of 2019 which were also similar on the prior PET. Ovoid RIGHT juxta hilar mass which is increased in size and is difficult to separate from surrounding consolidative post treatment changes (image 51, series 4) 31 x 27 mm, previously 25 x 20 mm. No new consolidation elsewhere in the chest. No sign of pleural effusion. Apical scarring on the LEFT with similar appearance. Airways grossly patent aside from some narrowing of airways in the RIGHT hilum due to post  treatment changes. Musculoskeletal: Subtle sclerotic focus in the RIGHT paramidline sternum (image 70, series 4) not changed since previous imaging, a very subtle finding without signs of FDG uptake on prior PET. Spinal degenerative changes. See below for full musculoskeletal details. CT ABDOMEN PELVIS FINDINGS Hepatobiliary: Subtle area of low attenuation in the RIGHT hepatic lobe (image 64, series 2) 6 mm not definitely seen on previous imaging though potentially present on prior PET and without increased FDG uptake is indeterminate no pericholecystic stranding. No biliary duct dilation. Portal vein is patent. Pancreas: Normal, without mass, inflammation or ductal dilatation. Spleen: Normal Adrenals/Urinary Tract: Adrenal glands are normal. Symmetric renal enhancement. Stable perinephric stranding, a chronic finding and not associated with hydronephrosis. Urinary bladder decompressed limiting assessment. Grossly normal. Stomach/Bowel: No acute gastrointestinal process. Colonic diverticulosis Vascular/Lymphatic: Calcified and noncalcified atheromatous plaque of the abdominal aorta. There is no gastrohepatic or hepatoduodenal ligament lymphadenopathy.  No retroperitoneal or mesenteric lymphadenopathy. Reproductive: Prostate unremarkable by CT. No pelvic sidewall lymphadenopathy. Other: Moderate bilateral fat containing inguinal hernias. No ascites. Musculoskeletal: No acute musculoskeletal finding or destructive bone process. Subtle very small sclerotic focus in the sternum less than a cm as described above. IMPRESSION: 1. Enlarging nodules and masses in the chest with enlarging lymph nodes as described. Findings of worsening disease in the chest as described. 2. Subtle area of low attenuation in the RIGHT hepatic lobe not definitely seen on previous imaging though potentially present on prior PET and without increased FDG uptake is indeterminate. Attention on follow-up. 3. Subtle sclerotic focus in the RIGHT  sternum, attention on follow-up. 4. No additional signs of disease in the chest, abdomen or pelvis. 5. Aortic atherosclerosis. 6. Moderate bilateral fat containing inguinal hernias. Aortic Atherosclerosis (ICD10-I70.0). Electronically Signed   By: Zetta Bills M.D.   On: 12/03/2020 14:33   CT Abdomen Pelvis W Contrast  Result Date: 12/03/2020 CLINICAL DATA:  Non-small cell lung cancer staging in this 60 year old male found to have non-small cell lung cancer post chemo radiotherapy and immunotherapy, ongoing systemic therapy by report. EXAM: CT CHEST, ABDOMEN, AND PELVIS WITH CONTRAST TECHNIQUE: Multidetector CT imaging of the chest, abdomen and pelvis was performed following the standard protocol during bolus administration of intravenous contrast. CONTRAST:  167mL OMNIPAQUE IOHEXOL 300 MG/ML  SOLN COMPARISON:  PET exam of September 16, 2020 FINDINGS: CT CHEST FINDINGS Cardiovascular: LEFT-sided Port-A-Cath terminates at the caval to atrial junction. Heart size is normal without sign of pericardial effusion. Aortic caliber is normal. Central pulmonary vasculature unremarkable on venous phase assessment. Mediastinum/Nodes: RIGHT subpectoral lymph node (image 6, series 2) 16 mm, previously 10 mm short axis RIGHT supraclavicular/lower neck lymph node (image 3, series 2) 16 mm previously approximately 14 mm short axis. RIGHT paratracheal lymph node (image 23, series 2) 13 mm short axis, previously 10 mm short axis. This is not a distinct lymph node but is stranding and soft tissue in the area of the RIGHT paratracheal chain. (Image 26, series 2) 12 mm subcarinal lymph node previously 7 mm. Consolidative changes about the RIGHT hilum contiguous with pulmonary lesions, see below. Lungs/Pleura: Enlarging RIGHT upper lobe pulmonary nodules, larger than in October of 2021. (Image 33, series 4) 16 mm RIGHT upper lobe pulmonary nodule previously 13 mm. Smaller nodule seen posterior to this area now nearly  contiguous/confluency with the dominant upper lobe nodule now measuring 14 mm (image 30, series 4) previously 7 mm, measurements from October of 2019 which were also similar on the prior PET. Ovoid RIGHT juxta hilar mass which is increased in size and is difficult to separate from surrounding consolidative post treatment changes (image 51, series 4) 31 x 27 mm, previously 25 x 20 mm. No new consolidation elsewhere in the chest. No sign of pleural effusion. Apical scarring on the LEFT with similar appearance. Airways grossly patent aside from some narrowing of airways in the RIGHT hilum due to post treatment changes. Musculoskeletal: Subtle sclerotic focus in the RIGHT paramidline sternum (image 70, series 4) not changed since previous imaging, a very subtle finding without signs of FDG uptake on prior PET. Spinal degenerative changes. See below for full musculoskeletal details. CT ABDOMEN PELVIS FINDINGS Hepatobiliary: Subtle area of low attenuation in the RIGHT hepatic lobe (image 64, series 2) 6 mm not definitely seen on previous imaging though potentially present on prior PET and without increased FDG uptake is indeterminate no pericholecystic stranding. No biliary duct dilation. Portal vein  is patent. Pancreas: Normal, without mass, inflammation or ductal dilatation. Spleen: Normal Adrenals/Urinary Tract: Adrenal glands are normal. Symmetric renal enhancement. Stable perinephric stranding, a chronic finding and not associated with hydronephrosis. Urinary bladder decompressed limiting assessment. Grossly normal. Stomach/Bowel: No acute gastrointestinal process. Colonic diverticulosis Vascular/Lymphatic: Calcified and noncalcified atheromatous plaque of the abdominal aorta. There is no gastrohepatic or hepatoduodenal ligament lymphadenopathy. No retroperitoneal or mesenteric lymphadenopathy. Reproductive: Prostate unremarkable by CT. No pelvic sidewall lymphadenopathy. Other: Moderate bilateral fat containing  inguinal hernias. No ascites. Musculoskeletal: No acute musculoskeletal finding or destructive bone process. Subtle very small sclerotic focus in the sternum less than a cm as described above. IMPRESSION: 1. Enlarging nodules and masses in the chest with enlarging lymph nodes as described. Findings of worsening disease in the chest as described. 2. Subtle area of low attenuation in the RIGHT hepatic lobe not definitely seen on previous imaging though potentially present on prior PET and without increased FDG uptake is indeterminate. Attention on follow-up. 3. Subtle sclerotic focus in the RIGHT sternum, attention on follow-up. 4. No additional signs of disease in the chest, abdomen or pelvis. 5. Aortic atherosclerosis. 6. Moderate bilateral fat containing inguinal hernias. Aortic Atherosclerosis (ICD10-I70.0). Electronically Signed   By: Zetta Bills M.D.   On: 12/03/2020 14:33    ASSESSMENT AND PLAN: This is a 61 years old white male was recently diagnosed with stage IIIa non-small cell lung cancer, squamous cell carcinoma in April 2021 presented with right upper lobe/suprahilar lung mass in addition to right paratracheal and subcarinal lymphadenopathy. The patient completed a course of concurrent chemoradiation with weekly carboplatin for AUC of 2 and paclitaxel 45 MG/M2.  He is status post 7 cycles.  He tolerated the previous course of his treatment well except for mild dysphagia and odynophagia. He underwent consolidation treatment with immunotherapy with Imfinzi 1500 mg IV every 4 weeks.  Status post 3 cycles.  The patient tolerated the treatment well but unfortunately he has evidence for disease progression after cycle #3. He is started first-line treatment with chemotherapy with carboplatin for AUC of 5, paclitaxel 175 mg/M2 and Keytruda 200 mg IV every 3 weeks.  Status post 3 cycles.  He has been tolerating this treatment well with no concerning adverse effects except for mild fatigue. The patient  had repeat CT scan of the chest, abdomen pelvis performed recently.  I personally and independently reviewed the scan images and discussed the result and showed the images to the patient today. Unfortunately his scan showed enlarging nodules and masses in the chest with enlarging lymph nodes.  This is definitely highly suspicious for disease progression but to the progression on immunotherapy could not be completely excluded. I recommended for the patient to discontinue his treatment at this point. I discussed with him the option of palliative care versus palliative systemic chemotherapy with second line docetaxel 75 Mg/M2 and Cyramza 10 mg/KG every 3 weeks with Neulasta support.  The patient is interested in proceeding with chemotherapy. I discussed with him the adverse effect of this treatment including but not limited to alopecia, myelosuppression, nausea and vomiting, peripheral neuropathy, liver or renal dysfunction as well as the bleeding disorder from [place].  I also provided the patient with handout about these drugs and the after visit summary. He is expected to start the first cycle of this treatment on December 11, 2020. The patient will come back for follow-up visit in 2 weeks for evaluation and management of any adverse effect of his treatment. For pain management I  sent refill of oxycodone to his pharmacy and also steroid for premedication. The patient was advised to call immediately if he has any concerning symptoms in the interval. The patient voices understanding of current disease status and treatment options and is in agreement with the current care plan. All questions were answered. The patient knows to call the clinic with any problems, questions or concerns. We can certainly see the patient much sooner if necessary.  Disclaimer: This note was dictated with voice recognition software. Similar sounding words can inadvertently be transcribed and may not be corrected upon review.

## 2020-12-04 NOTE — Progress Notes (Signed)
ON PATHWAY REGIMEN - Non-Small Cell Lung  No Change  Continue With Treatment as Ordered.  Original Decision Date/Time: 09/23/2020 11:56     A cycle is every 21 days:     Pembrolizumab      Paclitaxel      Carboplatin   **Always confirm dose/schedule in your pharmacy ordering system**  Patient Characteristics: Stage IV Metastatic, Squamous, PS = 0, 1, First Line, PD-L1 Expression Positive 1-49% (TPS) / Negative / Not Tested / Awaiting Test Results and Immunotherapy Candidate Therapeutic Status: Stage IV Metastatic Histology: Squamous Cell Line of therapy: First Line ECOG Performance Status: 1 PD-L1 Expression Status: Quantity Not Sufficient Immunotherapy Candidate Status: Candidate for Immunotherapy Intent of Therapy: Non-Curative / Palliative Intent, Discussed with Patient

## 2020-12-04 NOTE — Patient Instructions (Signed)
Ramucirumab injection What is this medicine? RAMUCIRUMAB (ra mue SIR ue mab) is a monoclonal antibody. It is used to treat stomach cancer, colorectal cancer, liver cancer, and lung cancer. This medicine may be used for other purposes; ask your health care provider or pharmacist if you have questions. COMMON BRAND NAME(S): Cyramza What should I tell my health care provider before I take this medicine? They need to know if you have any of these conditions:  bleeding disorders  blood clots  heart disease, including heart failure, heart attack, or chest pain (angina)  high blood pressure  infection (especially a virus infection such as chickenpox, cold sores, or herpes)  protein in your urine  recent or planning to have surgery  stroke  an unusual or allergic reaction to ramucirumab, other medicines, foods, dyes, or preservatives  pregnant or trying to get pregnant  breast-feeding How should I use this medicine? This medicine is for infusion into a vein. It is given by a health care professional in a hospital or clinic setting. Talk to your pediatrician regarding the use of this medicine in children. Special care may be needed. Overdosage: If you think you have taken too much of this medicine contact a poison control center or emergency room at once. NOTE: This medicine is only for you. Do not share this medicine with others. What if I miss a dose? It is important not to miss your dose. Call your doctor or health care professional if you are unable to keep an appointment. What may interact with this medicine? Interactions have not been studied. This list may not describe all possible interactions. Give your health care provider a list of all the medicines, herbs, non-prescription drugs, or dietary supplements you use. Also tell them if you smoke, drink alcohol, or use illegal drugs. Some items may interact with your medicine. What should I watch for while using this medicine? Your  condition will be monitored carefully while you are receiving this medicine. You will need to to check your blood pressure and have your blood and urine tested while you are taking this medicine. Your condition will be monitored carefully while you are receiving this medicine. This medicine may increase your risk to bruise or bleed. Call your doctor or health care professional if you notice any unusual bleeding. Before having surgery, talk to your health care provider to make sure it is ok. This drug can increase the risk of poor healing of your surgical site or wound. You will need to stop this drug for 28 days before surgery. After surgery, wait at least 2 weeks before restarting this drug. Make sure the surgical site or wound is healed enough before restarting this drug. Talk to your health care provider if questions. Do not become pregnant while taking this medicine or for 3 months after stopping it. Women should inform their doctor if they wish to become pregnant or think they might be pregnant. There is a potential for serious side effects to an unborn child. Talk to your health care professional or pharmacist for more information. Do not breast-feed an infant while taking this medicine or for 2 months after stopping it. This medicine may interfere with the ability to have a child. Talk with your doctor or health care professional if you are concerned about your fertility. What side effects may I notice from receiving this medicine? Side effects that you should report to your doctor or health care professional as soon as possible:  allergic reactions like skin rash,  itching or hives, breathing problems, swelling of the face, lips, or tongue  signs of infection - fever or chills, cough, sore throat  chest pain or chest tightness  confusion  dizziness  feeling faint or lightheaded, falls  severe abdominal pain  severe nausea, vomiting  signs and symptoms of bleeding such as bloody or  black, tarry stools; red or dark-brown urine; spitting up blood or brown material that looks like coffee grounds; red spots on the skin; unusual bruising or bleeding from the eye, gums, or nose  signs and symptoms of a blood clot such as breathing problems; changes in vision; chest pain; severe, sudden headache; pain, swelling, warmth in the leg; trouble speaking; sudden numbness or weakness of the face, arm or leg  symptoms of a stroke: change in mental awareness, inability to talk or move one side of the body  trouble walking, dizziness, loss of balance or coordination Side effects that usually do not require medical attention (report to your doctor or health care professional if they continue or are bothersome):  cold, clammy skin  constipation  diarrhea  headache  nausea, vomiting  stomach pain  unusually slow heartbeat  unusually weak or tired This list may not describe all possible side effects. Call your doctor for medical advice about side effects. You may report side effects to FDA at 1-800-FDA-1088. Where should I keep my medicine? This drug is given in a hospital or clinic and will not be stored at home. NOTE: This sheet is a summary. It may not cover all possible information. If you have questions about this medicine, talk to your doctor, pharmacist, or health care provider.  2021 Elsevier/Gold Standard (2019-08-29 11:17:50) Docetaxel injection What is this medicine? DOCETAXEL (doe se TAX el) is a chemotherapy drug. It targets fast dividing cells, like cancer cells, and causes these cells to die. This medicine is used to treat many types of cancers like breast cancer, certain stomach cancers, head and neck cancer, lung cancer, and prostate cancer. This medicine may be used for other purposes; ask your health care provider or pharmacist if you have questions. COMMON BRAND NAME(S): Docefrez, Taxotere What should I tell my health care provider before I take this  medicine? They need to know if you have any of these conditions:  infection (especially a virus infection such as chickenpox, cold sores, or herpes)  liver disease  low blood counts, like low white cell, platelet, or red cell counts  an unusual or allergic reaction to docetaxel, polysorbate 80, other chemotherapy agents, other medicines, foods, dyes, or preservatives  pregnant or trying to get pregnant  breast-feeding How should I use this medicine? This drug is given as an infusion into a vein. It is administered in a hospital or clinic by a specially trained health care professional. Talk to your pediatrician regarding the use of this medicine in children. Special care may be needed. Overdosage: If you think you have taken too much of this medicine contact a poison control center or emergency room at once. NOTE: This medicine is only for you. Do not share this medicine with others. What if I miss a dose? It is important not to miss your dose. Call your doctor or health care professional if you are unable to keep an appointment. What may interact with this medicine? Do not take this medicine with any of the following medications:  live virus vaccines This medicine may also interact with the following medications:  aprepitant  certain antibiotics like erythromycin or  clarithromycin  certain antivirals for HIV or hepatitis  certain medicines for fungal infections like fluconazole, itraconazole, ketoconazole, posaconazole, or voriconazole  cimetidine  ciprofloxacin  conivaptan  cyclosporine  dronedarone  fluvoxamine  grapefruit juice  imatinib  verapamil This list may not describe all possible interactions. Give your health care provider a list of all the medicines, herbs, non-prescription drugs, or dietary supplements you use. Also tell them if you smoke, drink alcohol, or use illegal drugs. Some items may interact with your medicine. What should I watch for while  using this medicine? Your condition will be monitored carefully while you are receiving this medicine. You will need important blood work done while you are taking this medicine. Call your doctor or health care professional for advice if you get a fever, chills or sore throat, or other symptoms of a cold or flu. Do not treat yourself. This drug decreases your body's ability to fight infections. Try to avoid being around people who are sick. Some products may contain alcohol. Ask your health care professional if this medicine contains alcohol. Be sure to tell all health care professionals you are taking this medicine. Certain medicines, like metronidazole and disulfiram, can cause an unpleasant reaction when taken with alcohol. The reaction includes flushing, headache, nausea, vomiting, sweating, and increased thirst. The reaction can last from 30 minutes to several hours. You may get drowsy or dizzy. Do not drive, use machinery, or do anything that needs mental alertness until you know how this medicine affects you. Do not stand or sit up quickly, especially if you are an older patient. This reduces the risk of dizzy or fainting spells. Alcohol may interfere with the effect of this medicine. Talk to your health care professional about your risk of cancer. You may be more at risk for certain types of cancer if you take this medicine. Do not become pregnant while taking this medicine or for 6 months after stopping it. Women should inform their doctor if they wish to become pregnant or think they might be pregnant. There is a potential for serious side effects to an unborn child. Talk to your health care professional or pharmacist for more information. Do not breast-feed an infant while taking this medicine or for 1 week after stopping it. Males who get this medicine must use a condom during sex with females who can get pregnant. If you get a woman pregnant, the baby could have birth defects. The baby could die  before they are born. You will need to continue wearing a condom for 3 months after stopping the medicine. Tell your health care provider right away if your partner becomes pregnant while you are taking this medicine. This may interfere with the ability to father a child. You should talk to your doctor or health care professional if you are concerned about your fertility. What side effects may I notice from receiving this medicine? Side effects that you should report to your doctor or health care professional as soon as possible:  allergic reactions like skin rash, itching or hives, swelling of the face, lips, or tongue  blurred vision  breathing problems  changes in vision  low blood counts - This drug may decrease the number of white blood cells, red blood cells and platelets. You may be at increased risk for infections and bleeding.  nausea and vomiting  pain, redness or irritation at site where injected  pain, tingling, numbness in the hands or feet  redness, blistering, peeling, or loosening of the  skin, including inside the mouth  signs of decreased platelets or bleeding - bruising, pinpoint red spots on the skin, black, tarry stools, nosebleeds  signs of decreased red blood cells - unusually weak or tired, fainting spells, lightheadedness  signs of infection - fever or chills, cough, sore throat, pain or difficulty passing urine  swelling of the ankle, feet, hands Side effects that usually do not require medical attention (report to your doctor or health care professional if they continue or are bothersome):  constipation  diarrhea  fingernail or toenail changes  hair loss  loss of appetite  mouth sores  muscle pain This list may not describe all possible side effects. Call your doctor for medical advice about side effects. You may report side effects to FDA at 1-800-FDA-1088. Where should I keep my medicine? This drug is given in a hospital or clinic and will  not be stored at home. NOTE: This sheet is a summary. It may not cover all possible information. If you have questions about this medicine, talk to your doctor, pharmacist, or health care provider.  2021 Elsevier/Gold Standard (2019-10-01 19:50:31)

## 2020-12-04 NOTE — Progress Notes (Signed)
DISCONTINUE ON PATHWAY REGIMEN - Non-Small Cell Lung     A cycle is every 21 days:     Pembrolizumab      Paclitaxel      Carboplatin   **Always confirm dose/schedule in your pharmacy ordering system**  REASON: Disease Progression PRIOR TREATMENT: [id number]: Pembrolizumab 200 mg + Carboplatin AUC=6 + Paclitaxel 200 mg/m2 q21 Days x 4 Cycles TREATMENT RESPONSE: Progressive Disease (PD)  START ON PATHWAY REGIMEN - Non-Small Cell Lung     A cycle is every 21 days:     Ramucirumab      Docetaxel   **Always confirm dose/schedule in your pharmacy ordering system**  Patient Characteristics: Stage IV Metastatic, Squamous, PS = 0, 1, Second Line, Prior PD-1/PD-L1 Inhibitor + Chemotherapy or No Prior PD-1/PD-L1 Inhibitor, and Not a Candidate for Immunotherapy Therapeutic Status: Stage IV Metastatic Histology: Squamous Cell Line of therapy: Second Line ECOG Performance Status: 1 PD-L1 Expression Status: Quantity Not Sufficient Immunotherapy Candidate Status: Not a Candidate for Immunotherapy Prior Immunotherapy Status: Prior PD-1/PD-L1 Inhibitor + Chemotherapy Intent of Therapy: Non-Curative / Palliative Intent, Discussed with Patient

## 2020-12-06 ENCOUNTER — Inpatient Hospital Stay: Payer: BC Managed Care – PPO

## 2020-12-09 ENCOUNTER — Encounter: Payer: Self-pay | Admitting: Internal Medicine

## 2020-12-09 NOTE — Progress Notes (Signed)
The following biosimilar Udenyca ([id number]) has been selected for use in this patient.  Kennith Center, Pharm.D., CPP 12/09/2020@3 :58 PM

## 2020-12-10 ENCOUNTER — Other Ambulatory Visit: Payer: Self-pay

## 2020-12-10 ENCOUNTER — Other Ambulatory Visit: Payer: BC Managed Care – PPO

## 2020-12-10 ENCOUNTER — Encounter: Payer: Self-pay | Admitting: Osteopathic Medicine

## 2020-12-10 ENCOUNTER — Encounter: Payer: Self-pay | Admitting: Internal Medicine

## 2020-12-10 ENCOUNTER — Ambulatory Visit (INDEPENDENT_AMBULATORY_CARE_PROVIDER_SITE_OTHER): Payer: BC Managed Care – PPO | Admitting: Osteopathic Medicine

## 2020-12-10 VITALS — BP 124/83 | HR 99 | Wt 249.7 lb

## 2020-12-10 DIAGNOSIS — E119 Type 2 diabetes mellitus without complications: Secondary | ICD-10-CM

## 2020-12-10 LAB — POCT GLYCOSYLATED HEMOGLOBIN (HGB A1C): Hemoglobin A1C: 9.3 % — AB (ref 4.0–5.6)

## 2020-12-10 MED ORDER — ALBUTEROL SULFATE HFA 108 (90 BASE) MCG/ACT IN AERS: 1.0000 | INHALATION_SPRAY | RESPIRATORY_TRACT | 99 refills | Status: DC | PRN

## 2020-12-10 MED ORDER — OZEMPIC (0.25 OR 0.5 MG/DOSE) 2 MG/1.5ML ~~LOC~~ SOPN: 0.5000 mg | PEN_INJECTOR | SUBCUTANEOUS | 1 refills | Status: DC

## 2020-12-10 NOTE — Progress Notes (Signed)
HPI: William Farrell is a 60 y.o. male who  has a past medical history of Asthma, Diabetes ([place]), Diverticulitis, GERD (gastroesophageal reflux disease), High cholesterol, and History of kidney stones.  he presents to [facility] today, 12/10/20,  for chief complaint of:  DM2 follow-up, still having hyperglycemia w/ cancer treatments (steroids)    DM2 follow-up -there have been concerns about hyperglycemia likely as a side effect of cancer treatments particularly steroids.  Patient has also noted some weight gain, probably some room for improvement in terms of diet/activity. Pt has declined insulin coverage. He is maxed on glipizide and metformin. At this point plan from [place] is indefinite chemo, so steroid treatment isn't going away anytime soon  A1C about this time last year 7.1 --> 3 mos ago was 8.7 --> today is 9.3     ASSESSMENT/PLAN: The encounter diagnosis was Type 2 diabetes mellitus without complication, without long-term current use of insulin ([place]).    Orders Placed This Encounter  Procedures  . POCT glycosylated hemoglobin (Hb A1C)     Meds ordered this encounter  Medications  . albuterol (VENTOLIN HFA) 108 (90 Base) MCG/ACT inhaler    Sig: Inhale 1-2 puffs into the lungs every 4 (four) hours as needed for wheezing or shortness of breath.    Dispense:  18 g    Refill:  99  . Semaglutide,0.25 or 0.5MG /DOS, (OZEMPIC, 0.25 OR 0.5 MG/DOSE,) 2 MG/1.5ML SOPN    Sig: Inject 0.5 mg into the skin once a week.    Dispense:  7.5 mL    Refill:  1    Please also disp appropriate needles #100 refill prn    There are no Patient Instructions on file for this visit.    Follow-up plan: Return in about 3 months (around 03/10/2021) for MONITOR A1C, SEE ME SOONER IF NEEDED. [place] IN 2 WEEKS TO CHECK IN ON NEW RX  .                                                 ################################################# ################################################# ################################################# #################################################    Current Meds  Medication Sig  . acetaminophen (TYLENOL) 500 MG tablet Take 1,000 mg by mouth every 6 (six) hours as needed for mild pain.  Marland Kitchen albuterol (VENTOLIN HFA) 108 (90 Base) MCG/ACT inhaler Inhale 1-2 puffs into the lungs every 4 (four) hours as needed for wheezing or shortness of breath.  Marland Kitchen aspirin EC 81 MG tablet Take 81 mg by mouth at bedtime.   Marland Kitchen atorvastatin (LIPITOR) 40 MG tablet TAKE 1 TABLET DAILY (Patient taking differently: Take 40 mg by mouth at bedtime.)  . calcium carbonate (TUMS - DOSED IN MG ELEMENTAL CALCIUM) 500 MG chewable tablet Chew 2 tablets by mouth 3 (three) times daily as needed for indigestion or heartburn.   . CHANTIX STARTING MONTH PAK 0.5 MG X 11 & 1 MG X 42 tablet TAKE ONE 0.5 MG TABLET BY MOUTH ONCE DAILY FOR 3 DAYS, THEN INCREASE TO ONE 0.5 MG TABLET TWICE DAILY FOR 4 DAYS, THEN INCREASE TO ONE 1 MG TABLET TWICE DAILY.  Marland Kitchen dexamethasone (DECADRON) 4 MG tablet 1 tablet p.o. twice daily the day before, day of and day after chemotherapy every 3 weeks.  . docusate sodium (COLACE) 100 MG capsule Take 100 mg by mouth daily.  Marland Kitchen glipiZIDE (GLUCOTROL) 5  MG tablet Take 1 tablet (5 mg total) by mouth 2 (two) times daily before a meal.  . lidocaine-prilocaine (EMLA) cream Apply 1 application topically as needed.  . meloxicam (MOBIC) 15 MG tablet TAKE 1 TABLET DAILY (Patient taking differently: Take 15 mg by mouth daily.)  . metFORMIN (GLUCOPHAGE) 1000 MG tablet TAKE 1 TABLET TWICE A DAY WITH MEALS (Patient taking differently: Take 500 mg by mouth 2 (two) times daily with a meal.)  . OneTouch Delica Lancets 50V MISC As directed up to qid  . ONETOUCH ULTRA test strip USE AS  INSTRUCTED UP TO FOUR TIMES A DAY  . oxyCODONE-acetaminophen (PERCOCET/ROXICET) 5-325 MG tablet Take 1 tablet by mouth every 8 (eight) hours as needed for severe pain.  Marland Kitchen prochlorperazine (COMPAZINE) 10 MG tablet Take 1 tablet (10 mg total) by mouth every 6 (six) hours as needed for nausea or vomiting.  . Semaglutide,0.25 or 0.5MG /DOS, (OZEMPIC, 0.25 OR 0.5 MG/DOSE,) 2 MG/1.5ML SOPN Inject 0.5 mg into the skin once a week.  . trolamine salicylate (ASPERCREME) 10 % cream Apply 1 application topically 2 (two) times daily as needed for muscle pain.   . [DISCONTINUED] albuterol (VENTOLIN HFA) 108 (90 Base) MCG/ACT inhaler Inhale 2 puffs into the lungs every 6 (six) hours as needed for wheezing or shortness of breath.     Allergies  Allergen Reactions  . Sitagliptin Other (See Comments)    headache       Review of Systems: Pertinent (+) and (-) ROS in HPI as above   Exam:  BP 124/83   Pulse 99   Wt 249 lb 11.2 oz (113.3 kg)   SpO2 97%   BMI 33.87 kg/m   Constitutional: VS see above. General Appearance: alert, well-developed, well-nourished, NAD  Neck: No masses, trachea midline.   Respiratory: Normal respiratory effort. no wheeze, no rhonchi, no rales  Cardiovascular: S1/S2 normal, no murmur, no rub/gallop auscultated. RRR.   Neurological: Normal balance/coordination. No tremor.  Skin: warm, dry, intact.   Psychiatric: Normal judgment/insight. Normal mood and affect. Oriented x3.       Visit summary with medication list and pertinent instructions was printed for patient to review, patient was advised to alert [place] if any updates are needed. All questions at time of visit were answered - patient instructed to contact office with any additional concerns. ER/RTC precautions were reviewed with the patient and understanding verbalized.   Please note: voice recognition software was used to produce this document, and typos may escape review. Please contact Dr. Sheppard Coil for any  needed clarifications.    Follow up plan: Return in about 3 months (around 03/10/2021) for MONITOR A1C, SEE ME SOONER IF NEEDED. [place] IN 2 WEEKS TO CHECK IN ON NEW RX .

## 2020-12-10 NOTE — Telephone Encounter (Signed)
This was discussed yesterday and again this morning, with the pts wife. I advised the pt has been tentatively scheduled for Thursday 12/11/20 at our [place] location with an 8:15am arrival time. The additional appointments are stemming from the previous schedule message sent by Dr. Julien Nordmann after his 1/20 appointment. The 12/11/20 date is pending authorization by his insurance. She again expressed understanding of this information.

## 2020-12-11 ENCOUNTER — Other Ambulatory Visit: Payer: Self-pay | Admitting: *Deleted

## 2020-12-11 ENCOUNTER — Inpatient Hospital Stay: Payer: BC Managed Care – PPO

## 2020-12-11 ENCOUNTER — Ambulatory Visit: Payer: BC Managed Care – PPO | Admitting: Osteopathic Medicine

## 2020-12-11 VITALS — BP 120/76 | HR 86 | Temp 97.8°F | Resp 18

## 2020-12-11 DIAGNOSIS — Z79899 Other long term (current) drug therapy: Secondary | ICD-10-CM | POA: Diagnosis not present

## 2020-12-11 DIAGNOSIS — Z5112 Encounter for antineoplastic immunotherapy: Secondary | ICD-10-CM | POA: Diagnosis not present

## 2020-12-11 DIAGNOSIS — C3411 Malignant neoplasm of upper lobe, right bronchus or lung: Secondary | ICD-10-CM

## 2020-12-11 DIAGNOSIS — Z87442 Personal history of urinary calculi: Secondary | ICD-10-CM | POA: Diagnosis not present

## 2020-12-11 DIAGNOSIS — Z7982 Long term (current) use of aspirin: Secondary | ICD-10-CM | POA: Diagnosis not present

## 2020-12-11 DIAGNOSIS — R739 Hyperglycemia, unspecified: Secondary | ICD-10-CM

## 2020-12-11 DIAGNOSIS — Z791 Long term (current) use of non-steroidal anti-inflammatories (NSAID): Secondary | ICD-10-CM | POA: Diagnosis not present

## 2020-12-11 DIAGNOSIS — K219 Gastro-esophageal reflux disease without esophagitis: Secondary | ICD-10-CM | POA: Diagnosis not present

## 2020-12-11 DIAGNOSIS — E7800 Pure hypercholesterolemia, unspecified: Secondary | ICD-10-CM | POA: Diagnosis not present

## 2020-12-11 DIAGNOSIS — Z5189 Encounter for other specified aftercare: Secondary | ICD-10-CM | POA: Diagnosis not present

## 2020-12-11 DIAGNOSIS — R059 Cough, unspecified: Secondary | ICD-10-CM | POA: Diagnosis not present

## 2020-12-11 DIAGNOSIS — Z5111 Encounter for antineoplastic chemotherapy: Secondary | ICD-10-CM | POA: Diagnosis not present

## 2020-12-11 DIAGNOSIS — R5383 Other fatigue: Secondary | ICD-10-CM | POA: Diagnosis not present

## 2020-12-11 DIAGNOSIS — Z7984 Long term (current) use of oral hypoglycemic drugs: Secondary | ICD-10-CM | POA: Diagnosis not present

## 2020-12-11 DIAGNOSIS — E119 Type 2 diabetes mellitus without complications: Secondary | ICD-10-CM | POA: Diagnosis not present

## 2020-12-11 LAB — CMP (CANCER CENTER ONLY)
ALT: 16 U/L (ref 0–44)
AST: 11 U/L — ABNORMAL LOW (ref 15–41)
Albumin: 4.2 g/dL (ref 3.5–5.0)
Alkaline Phosphatase: 141 U/L — ABNORMAL HIGH (ref 38–126)
Anion gap: 12 (ref 5–15)
BUN: 20 mg/dL (ref 6–20)
CO2: 22 mmol/L (ref 22–32)
Calcium: 10.4 mg/dL — ABNORMAL HIGH (ref 8.9–10.3)
Chloride: 101 mmol/L (ref 98–111)
Creatinine: 1.27 mg/dL — ABNORMAL HIGH (ref 0.61–1.24)
GFR, Estimated: >60 mL/min (ref >60)
Glucose, Bld: 383 mg/dL — ABNORMAL HIGH (ref 70–99)
Potassium: 4 mmol/L (ref 3.5–5.1)
Sodium: 135 mmol/L (ref 135–145)
Total Bilirubin: 0.4 mg/dL (ref 0.3–1.2)
Total Protein: 7.2 g/dL (ref 6.5–8.1)

## 2020-12-11 LAB — CBC WITH DIFFERENTIAL (CANCER CENTER ONLY)
Abs Immature Granulocytes: 0.07 10*3/uL (ref 0.00–0.07)
Basophils Absolute: 0 10*3/uL (ref 0.0–0.1)
Basophils Relative: 0 %
Eosinophils Absolute: 0 10*3/uL (ref 0.0–0.5)
Eosinophils Relative: 0 %
HCT: 29.7 % — ABNORMAL LOW (ref 39.0–52.0)
Hemoglobin: 10 g/dL — ABNORMAL LOW (ref 13.0–17.0)
Immature Granulocytes: 1 %
Lymphocytes Relative: 4 %
Lymphs Abs: 0.5 10*3/uL — ABNORMAL LOW (ref 0.7–4.0)
MCH: 29.1 pg (ref 26.0–34.0)
MCHC: 33.7 g/dL (ref 30.0–36.0)
MCV: 86.3 fL (ref 80.0–100.0)
Monocytes Absolute: 0.7 10*3/uL (ref 0.1–1.0)
Monocytes Relative: 5 %
Neutro Abs: 12.5 10*3/uL — ABNORMAL HIGH (ref 1.7–7.7)
Neutrophils Relative %: 90 %
Platelet Count: 213 10*3/uL (ref 150–400)
RBC: 3.44 MIL/uL — ABNORMAL LOW (ref 4.22–5.81)
RDW: 19 % — ABNORMAL HIGH (ref 11.5–15.5)
WBC Count: 13.9 10*3/uL — ABNORMAL HIGH (ref 4.0–10.5)
nRBC: 0 % (ref 0.0–0.2)

## 2020-12-11 LAB — TOTAL PROTEIN, URINE DIPSTICK: Protein, ur: NEGATIVE mg/dL

## 2020-12-11 MED ORDER — SODIUM CHLORIDE 0.9 % IV SOLN
Freq: Once | INTRAVENOUS | Status: AC
  Filled 2020-12-11: qty 250

## 2020-12-11 MED ORDER — SODIUM CHLORIDE 0.9 % IV SOLN
10.0000 mg/kg | Freq: Once | INTRAVENOUS | Status: AC
  Administered 2020-12-11: 1100 mg via INTRAVENOUS
  Filled 2020-12-11: qty 100

## 2020-12-11 MED ORDER — DIPHENHYDRAMINE HCL 50 MG/ML IJ SOLN
50.0000 mg | Freq: Once | INTRAMUSCULAR | Status: AC
  Administered 2020-12-11: 50 mg via INTRAVENOUS

## 2020-12-11 MED ORDER — INSULIN ASPART 100 UNIT/ML ~~LOC~~ SOLN
8.0000 [IU] | Freq: Once | SUBCUTANEOUS | Status: AC
  Administered 2020-12-11: 8 [IU] via SUBCUTANEOUS
  Filled 2020-12-11: qty 0.08

## 2020-12-11 MED ORDER — ACETAMINOPHEN 325 MG PO TABS
650.0000 mg | ORAL_TABLET | Freq: Once | ORAL | Status: AC
  Administered 2020-12-11: 650 mg via ORAL

## 2020-12-11 MED ORDER — ACETAMINOPHEN 325 MG PO TABS
ORAL_TABLET | ORAL | Status: AC
  Filled 2020-12-11: qty 2

## 2020-12-11 MED ORDER — HEPARIN SOD (PORK) LOCK FLUSH 100 UNIT/ML IV SOLN
500.0000 [IU] | Freq: Once | INTRAVENOUS | Status: AC | PRN
  Administered 2020-12-11: 500 [IU]
  Filled 2020-12-11: qty 5

## 2020-12-11 MED ORDER — DIPHENHYDRAMINE HCL 50 MG/ML IJ SOLN
INTRAMUSCULAR | Status: AC
  Filled 2020-12-11: qty 1

## 2020-12-11 MED ORDER — SODIUM CHLORIDE 0.9% FLUSH
10.0000 mL | INTRAVENOUS | Status: DC | PRN
  Administered 2020-12-11: 10 mL
  Filled 2020-12-11: qty 10

## 2020-12-11 MED ORDER — SODIUM CHLORIDE 0.9 % IV SOLN
10.0000 mg | Freq: Once | INTRAVENOUS | Status: AC
  Administered 2020-12-11: 10 mg via INTRAVENOUS
  Filled 2020-12-11: qty 10

## 2020-12-11 MED ORDER — SODIUM CHLORIDE 0.9 % IV SOLN
75.0000 mg/m2 | Freq: Once | INTRAVENOUS | Status: AC
  Administered 2020-12-11: 180 mg via INTRAVENOUS
  Filled 2020-12-11: qty 18

## 2020-12-11 NOTE — Patient Instructions (Signed)
Docetaxel injection What is this medicine? DOCETAXEL (doe se TAX el) is a chemotherapy drug. It targets fast dividing cells, like cancer cells, and causes these cells to die. This medicine is used to treat many types of cancers like breast cancer, certain stomach cancers, head and neck cancer, lung cancer, and prostate cancer. This medicine may be used for other purposes; ask your health care provider or pharmacist if you have questions. COMMON BRAND NAME(S): Docefrez, Taxotere What should I tell my health care provider before I take this medicine? They need to know if you have any of these conditions:  infection (especially a virus infection such as chickenpox, cold sores, or herpes)  liver disease  low blood counts, like low white cell, platelet, or red cell counts  an unusual or allergic reaction to docetaxel, polysorbate 80, other chemotherapy agents, other medicines, foods, dyes, or preservatives  pregnant or trying to get pregnant  breast-feeding How should I use this medicine? This drug is given as an infusion into a vein. It is administered in a hospital or clinic by a specially trained health care professional. Talk to your pediatrician regarding the use of this medicine in children. Special care may be needed. Overdosage: If you think you have taken too much of this medicine contact a poison control center or emergency room at once. NOTE: This medicine is only for you. Do not share this medicine with others. What if I miss a dose? It is important not to miss your dose. Call your doctor or health care professional if you are unable to keep an appointment. What may interact with this medicine? Do not take this medicine with any of the following medications:  live virus vaccines This medicine may also interact with the following medications:  aprepitant  certain antibiotics like erythromycin or clarithromycin  certain antivirals for HIV or hepatitis  certain medicines for  fungal infections like fluconazole, itraconazole, ketoconazole, posaconazole, or voriconazole  cimetidine  ciprofloxacin  conivaptan  cyclosporine  dronedarone  fluvoxamine  grapefruit juice  imatinib  verapamil This list may not describe all possible interactions. Give your health care provider a list of all the medicines, herbs, non-prescription drugs, or dietary supplements you use. Also tell them if you smoke, drink alcohol, or use illegal drugs. Some items may interact with your medicine. What should I watch for while using this medicine? Your condition will be monitored carefully while you are receiving this medicine. You will need important blood work done while you are taking this medicine. Call your doctor or health care professional for advice if you get a fever, chills or sore throat, or other symptoms of a cold or flu. Do not treat yourself. This drug decreases your body's ability to fight infections. Try to avoid being around people who are sick. Some products may contain alcohol. Ask your health care professional if this medicine contains alcohol. Be sure to tell all health care professionals you are taking this medicine. Certain medicines, like metronidazole and disulfiram, can cause an unpleasant reaction when taken with alcohol. The reaction includes flushing, headache, nausea, vomiting, sweating, and increased thirst. The reaction can last from 30 minutes to several hours. You may get drowsy or dizzy. Do not drive, use machinery, or do anything that needs mental alertness until you know how this medicine affects you. Do not stand or sit up quickly, especially if you are an older patient. This reduces the risk of dizzy or fainting spells. Alcohol may interfere with the effect of this  medicine. Talk to your health care professional about your risk of cancer. You may be more at risk for certain types of cancer if you take this medicine. Do not become pregnant while taking  this medicine or for 6 months after stopping it. Women should inform their doctor if they wish to become pregnant or think they might be pregnant. There is a potential for serious side effects to an unborn child. Talk to your health care professional or pharmacist for more information. Do not breast-feed an infant while taking this medicine or for 1 week after stopping it. Males who get this medicine must use a condom during sex with females who can get pregnant. If you get a woman pregnant, the baby could have birth defects. The baby could die before they are born. You will need to continue wearing a condom for 3 months after stopping the medicine. Tell your health care provider right away if your partner becomes pregnant while you are taking this medicine. This may interfere with the ability to father a child. You should talk to your doctor or health care professional if you are concerned about your fertility. What side effects may I notice from receiving this medicine? Side effects that you should report to your doctor or health care professional as soon as possible:  allergic reactions like skin rash, itching or hives, swelling of the face, lips, or tongue  blurred vision  breathing problems  changes in vision  low blood counts - This drug may decrease the number of white blood cells, red blood cells and platelets. You may be at increased risk for infections and bleeding.  nausea and vomiting  pain, redness or irritation at site where injected  pain, tingling, numbness in the hands or feet  redness, blistering, peeling, or loosening of the skin, including inside the mouth  signs of decreased platelets or bleeding - bruising, pinpoint red spots on the skin, black, tarry stools, nosebleeds  signs of decreased red blood cells - unusually weak or tired, fainting spells, lightheadedness  signs of infection - fever or chills, cough, sore throat, pain or difficulty passing urine  swelling  of the ankle, feet, hands Side effects that usually do not require medical attention (report to your doctor or health care professional if they continue or are bothersome):  constipation  diarrhea  fingernail or toenail changes  hair loss  loss of appetite  mouth sores  muscle pain This list may not describe all possible side effects. Call your doctor for medical advice about side effects. You may report side effects to FDA at 1-800-FDA-1088. Where should I keep my medicine? This drug is given in a hospital or clinic and will not be stored at home. NOTE: This sheet is a summary. It may not cover all possible information. If you have questions about this medicine, talk to your doctor, pharmacist, or health care provider.  2021 [employer] Standard (2019-10-01 19:50:31) Ramucirumab injection What is this medicine? RAMUCIRUMAB (ra mue SIR ue mab) is a monoclonal antibody. It is used to treat stomach cancer, colorectal cancer, liver cancer, and lung cancer. This medicine may be used for other purposes; ask your health care provider or pharmacist if you have questions. COMMON BRAND NAME(S): Cyramza What should I tell my health care provider before I take this medicine? They need to know if you have any of these conditions:  bleeding disorders  blood clots  heart disease, including heart failure, heart attack, or chest pain (angina)  high blood  pressure  infection (especially a virus infection such as chickenpox, cold sores, or herpes)  protein in your urine  recent or planning to have surgery  stroke  an unusual or allergic reaction to ramucirumab, other medicines, foods, dyes, or preservatives  pregnant or trying to get pregnant  breast-feeding How should I use this medicine? This medicine is for infusion into a vein. It is given by a health care professional in a hospital or clinic setting. Talk to your pediatrician regarding the use of this medicine in children.  Special care may be needed. Overdosage: If you think you have taken too much of this medicine contact a poison control center or emergency room at once. NOTE: This medicine is only for you. Do not share this medicine with others. What if I miss a dose? It is important not to miss your dose. Call your doctor or health care professional if you are unable to keep an appointment. What may interact with this medicine? Interactions have not been studied. This list may not describe all possible interactions. Give your health care provider a list of all the medicines, herbs, non-prescription drugs, or dietary supplements you use. Also tell them if you smoke, drink alcohol, or use illegal drugs. Some items may interact with your medicine. What should I watch for while using this medicine? Your condition will be monitored carefully while you are receiving this medicine. You will need to to check your blood pressure and have your blood and urine tested while you are taking this medicine. Your condition will be monitored carefully while you are receiving this medicine. This medicine may increase your risk to bruise or bleed. Call your doctor or health care professional if you notice any unusual bleeding. Before having surgery, talk to your health care provider to make sure it is ok. This drug can increase the risk of poor healing of your surgical site or wound. You will need to stop this drug for 28 days before surgery. After surgery, wait at least 2 weeks before restarting this drug. Make sure the surgical site or wound is healed enough before restarting this drug. Talk to your health care provider if questions. Do not become pregnant while taking this medicine or for 3 months after stopping it. Women should inform their doctor if they wish to become pregnant or think they might be pregnant. There is a potential for serious side effects to an unborn child. Talk to your health care professional or pharmacist for  more information. Do not breast-feed an infant while taking this medicine or for 2 months after stopping it. This medicine may interfere with the ability to have a child. Talk with your doctor or health care professional if you are concerned about your fertility. What side effects may I notice from receiving this medicine? Side effects that you should report to your doctor or health care professional as soon as possible:  allergic reactions like skin rash, itching or hives, breathing problems, swelling of the face, lips, or tongue  signs of infection - fever or chills, cough, sore throat  chest pain or chest tightness  confusion  dizziness  feeling faint or lightheaded, falls  severe abdominal pain  severe nausea, vomiting  signs and symptoms of bleeding such as bloody or black, tarry stools; red or dark-brown urine; spitting up blood or brown material that looks like coffee grounds; red spots on the skin; unusual bruising or bleeding from the eye, gums, or nose  signs and symptoms of a blood  clot such as breathing problems; changes in vision; chest pain; severe, sudden headache; pain, swelling, warmth in the leg; trouble speaking; sudden numbness or weakness of the face, arm or leg  symptoms of a stroke: change in mental awareness, inability to talk or move one side of the body  trouble walking, dizziness, loss of balance or coordination Side effects that usually do not require medical attention (report to your doctor or health care professional if they continue or are bothersome):  cold, clammy skin  constipation  diarrhea  headache  nausea, vomiting  stomach pain  unusually slow heartbeat  unusually weak or tired This list may not describe all possible side effects. Call your doctor for medical advice about side effects. You may report side effects to FDA at 1-800-FDA-1088. Where should I keep my medicine? This drug is given in a hospital or clinic and will not be  stored at home. NOTE: This sheet is a summary. It may not cover all possible information. If you have questions about this medicine, talk to your doctor, pharmacist, or health care provider.  2021 [employer]/Gold Standard (2019-08-29 11:17:50)

## 2020-12-12 ENCOUNTER — Inpatient Hospital Stay: Payer: BC Managed Care – PPO

## 2020-12-13 ENCOUNTER — Inpatient Hospital Stay: Payer: BC Managed Care – PPO

## 2020-12-13 ENCOUNTER — Other Ambulatory Visit: Payer: Self-pay

## 2020-12-13 VITALS — BP 118/79 | HR 105 | Temp 97.8°F | Resp 17

## 2020-12-13 DIAGNOSIS — R5383 Other fatigue: Secondary | ICD-10-CM | POA: Diagnosis not present

## 2020-12-13 DIAGNOSIS — Z791 Long term (current) use of non-steroidal anti-inflammatories (NSAID): Secondary | ICD-10-CM | POA: Diagnosis not present

## 2020-12-13 DIAGNOSIS — Z7982 Long term (current) use of aspirin: Secondary | ICD-10-CM | POA: Diagnosis not present

## 2020-12-13 DIAGNOSIS — E119 Type 2 diabetes mellitus without complications: Secondary | ICD-10-CM | POA: Diagnosis not present

## 2020-12-13 DIAGNOSIS — Z87442 Personal history of urinary calculi: Secondary | ICD-10-CM | POA: Diagnosis not present

## 2020-12-13 DIAGNOSIS — Z5111 Encounter for antineoplastic chemotherapy: Secondary | ICD-10-CM | POA: Diagnosis not present

## 2020-12-13 DIAGNOSIS — Z79899 Other long term (current) drug therapy: Secondary | ICD-10-CM | POA: Diagnosis not present

## 2020-12-13 DIAGNOSIS — Z5189 Encounter for other specified aftercare: Secondary | ICD-10-CM | POA: Diagnosis not present

## 2020-12-13 DIAGNOSIS — R059 Cough, unspecified: Secondary | ICD-10-CM | POA: Diagnosis not present

## 2020-12-13 DIAGNOSIS — C3411 Malignant neoplasm of upper lobe, right bronchus or lung: Secondary | ICD-10-CM | POA: Diagnosis not present

## 2020-12-13 DIAGNOSIS — K219 Gastro-esophageal reflux disease without esophagitis: Secondary | ICD-10-CM | POA: Diagnosis not present

## 2020-12-13 DIAGNOSIS — Z5112 Encounter for antineoplastic immunotherapy: Secondary | ICD-10-CM | POA: Diagnosis not present

## 2020-12-13 DIAGNOSIS — Z7984 Long term (current) use of oral hypoglycemic drugs: Secondary | ICD-10-CM | POA: Diagnosis not present

## 2020-12-13 DIAGNOSIS — E7800 Pure hypercholesterolemia, unspecified: Secondary | ICD-10-CM | POA: Diagnosis not present

## 2020-12-13 MED ORDER — PEGFILGRASTIM-CBQV 6 MG/0.6ML ~~LOC~~ SOSY
6.0000 mg | PREFILLED_SYRINGE | Freq: Once | SUBCUTANEOUS | Status: AC
  Administered 2020-12-13: 6 mg via SUBCUTANEOUS

## 2020-12-13 NOTE — Patient Instructions (Signed)
Pegfilgrastim injection What is this medicine? PEGFILGRASTIM (PEG fil gra stim) is a long-acting granulocyte colony-stimulating factor that stimulates the growth of neutrophils, a type of white blood cell important in the body's fight against infection. It is used to reduce the incidence of fever and infection in patients with certain types of cancer who are receiving chemotherapy that affects the bone marrow, and to increase survival after being exposed to high doses of radiation. This medicine may be used for other purposes; ask your health care provider or pharmacist if you have questions. COMMON BRAND NAME(S): Fulphila, Neulasta, Nyvepria, UDENYCA, Ziextenzo What should I tell my health care provider before I take this medicine? They need to know if you have any of these conditions:  kidney disease  latex allergy  ongoing radiation therapy  sickle cell disease  skin reactions to acrylic adhesives (On-Body Injector only)  an unusual or allergic reaction to pegfilgrastim, filgrastim, other medicines, foods, dyes, or preservatives  pregnant or trying to get pregnant  breast-feeding How should I use this medicine? This medicine is for injection under the skin. If you get this medicine at home, you will be taught how to prepare and give the pre-filled syringe or how to use the On-body Injector. Refer to the patient Instructions for Use for detailed instructions. Use exactly as directed. Tell your healthcare provider immediately if you suspect that the On-body Injector may not have performed as intended or if you suspect the use of the On-body Injector resulted in a missed or partial dose. It is important that you put your used needles and syringes in a special sharps container. Do not put them in a trash can. If you do not have a sharps container, call your pharmacist or healthcare provider to get one. Talk to your pediatrician regarding the use of this medicine in children. While this drug  may be prescribed for selected conditions, precautions do apply. Overdosage: If you think you have taken too much of this medicine contact a poison control center or emergency room at once. NOTE: This medicine is only for you. Do not share this medicine with others. What if I miss a dose? It is important not to miss your dose. Call your doctor or health care professional if you miss your dose. If you miss a dose due to an On-body Injector failure or leakage, a new dose should be administered as soon as possible using a single prefilled syringe for manual use. What may interact with this medicine? Interactions have not been studied. This list may not describe all possible interactions. Give your health care provider a list of all the medicines, herbs, non-prescription drugs, or dietary supplements you use. Also tell them if you smoke, drink alcohol, or use illegal drugs. Some items may interact with your medicine. What should I watch for while using this medicine? Your condition will be monitored carefully while you are receiving this medicine. You may need blood work done while you are taking this medicine. Talk to your health care provider about your risk of cancer. You may be more at risk for certain types of cancer if you take this medicine. If you are going to need a MRI, CT scan, or other procedure, tell your doctor that you are using this medicine (On-Body Injector only). What side effects may I notice from receiving this medicine? Side effects that you should report to your doctor or health care professional as soon as possible:  allergic reactions (skin rash, itching or hives, swelling of   the face, lips, or tongue)  back pain  dizziness  fever  pain, redness, or irritation at site where injected  pinpoint red spots on the skin  red or dark-brown urine  shortness of breath or breathing problems  stomach or side pain, or pain at the shoulder  swelling  tiredness  trouble  passing urine or change in the amount of urine  unusual bruising or bleeding Side effects that usually do not require medical attention (report to your doctor or health care professional if they continue or are bothersome):  bone pain  muscle pain This list may not describe all possible side effects. Call your doctor for medical advice about side effects. You may report side effects to FDA at 1-800-FDA-1088. Where should I keep my medicine? Keep out of the reach of children. If you are using this medicine at home, you will be instructed on how to store it. Throw away any unused medicine after the expiration date on the label. NOTE: This sheet is a summary. It may not cover all possible information. If you have questions about this medicine, talk to your doctor, pharmacist, or health care provider.  2021 [employer] Standard (2019-11-23 13:20:51)  

## 2020-12-17 ENCOUNTER — Inpatient Hospital Stay: Payer: BC Managed Care – PPO | Attending: Internal Medicine | Admitting: Internal Medicine

## 2020-12-17 ENCOUNTER — Other Ambulatory Visit: Payer: Self-pay

## 2020-12-17 ENCOUNTER — Other Ambulatory Visit: Payer: BC Managed Care – PPO

## 2020-12-17 ENCOUNTER — Inpatient Hospital Stay: Payer: BC Managed Care – PPO

## 2020-12-17 VITALS — BP 121/84 | HR 119 | Temp 98.5°F | Resp 16 | Ht 72.0 in | Wt 245.0 lb

## 2020-12-17 DIAGNOSIS — Z5112 Encounter for antineoplastic immunotherapy: Secondary | ICD-10-CM | POA: Insufficient documentation

## 2020-12-17 DIAGNOSIS — Z923 Personal history of irradiation: Secondary | ICD-10-CM | POA: Diagnosis not present

## 2020-12-17 DIAGNOSIS — Z7982 Long term (current) use of aspirin: Secondary | ICD-10-CM | POA: Diagnosis not present

## 2020-12-17 DIAGNOSIS — Z5189 Encounter for other specified aftercare: Secondary | ICD-10-CM | POA: Diagnosis not present

## 2020-12-17 DIAGNOSIS — Z79899 Other long term (current) drug therapy: Secondary | ICD-10-CM | POA: Diagnosis not present

## 2020-12-17 DIAGNOSIS — Z5111 Encounter for antineoplastic chemotherapy: Secondary | ICD-10-CM | POA: Diagnosis not present

## 2020-12-17 DIAGNOSIS — G4700 Insomnia, unspecified: Secondary | ICD-10-CM | POA: Diagnosis not present

## 2020-12-17 DIAGNOSIS — R059 Cough, unspecified: Secondary | ICD-10-CM | POA: Diagnosis not present

## 2020-12-17 DIAGNOSIS — R5383 Other fatigue: Secondary | ICD-10-CM | POA: Diagnosis not present

## 2020-12-17 DIAGNOSIS — C3411 Malignant neoplasm of upper lobe, right bronchus or lung: Secondary | ICD-10-CM

## 2020-12-17 LAB — CBC WITH DIFFERENTIAL (CANCER CENTER ONLY)
Abs Immature Granulocytes: 0.01 10*3/uL (ref 0.00–0.07)
Basophils Absolute: 0.1 10*3/uL (ref 0.0–0.1)
Basophils Relative: 2 %
Eosinophils Absolute: 0.1 10*3/uL (ref 0.0–0.5)
Eosinophils Relative: 4 %
HCT: 33.1 % — ABNORMAL LOW (ref 39.0–52.0)
Hemoglobin: 11.3 g/dL — ABNORMAL LOW (ref 13.0–17.0)
Immature Granulocytes: 0 %
Lymphocytes Relative: 12 %
Lymphs Abs: 0.5 10*3/uL — ABNORMAL LOW (ref 0.7–4.0)
MCH: 29.2 pg (ref 26.0–34.0)
MCHC: 34.1 g/dL (ref 30.0–36.0)
MCV: 85.5 fL (ref 80.0–100.0)
Monocytes Absolute: 0.5 10*3/uL (ref 0.1–1.0)
Monocytes Relative: 14 %
Neutro Abs: 2.5 10*3/uL (ref 1.7–7.7)
Neutrophils Relative %: 68 %
Platelet Count: 153 10*3/uL (ref 150–400)
RBC: 3.87 MIL/uL — ABNORMAL LOW (ref 4.22–5.81)
RDW: 18.6 % — ABNORMAL HIGH (ref 11.5–15.5)
WBC Count: 3.7 10*3/uL — ABNORMAL LOW (ref 4.0–10.5)
nRBC: 0 % (ref 0.0–0.2)

## 2020-12-17 LAB — TSH: TSH: 4.325 u[IU]/mL — ABNORMAL HIGH (ref 0.320–4.118)

## 2020-12-17 LAB — CMP (CANCER CENTER ONLY)
ALT: 17 U/L (ref 0–44)
AST: 14 U/L — ABNORMAL LOW (ref 15–41)
Albumin: 3.4 g/dL — ABNORMAL LOW (ref 3.5–5.0)
Alkaline Phosphatase: 151 U/L — ABNORMAL HIGH (ref 38–126)
Anion gap: 9 (ref 5–15)
BUN: 18 mg/dL (ref 6–20)
CO2: 22 mmol/L (ref 22–32)
Calcium: 9 mg/dL (ref 8.9–10.3)
Chloride: 103 mmol/L (ref 98–111)
Creatinine: 1.32 mg/dL — ABNORMAL HIGH (ref 0.61–1.24)
GFR, Estimated: >60 mL/min (ref >60)
Glucose, Bld: 312 mg/dL — ABNORMAL HIGH (ref 70–99)
Potassium: 4.5 mmol/L (ref 3.5–5.1)
Sodium: 134 mmol/L — ABNORMAL LOW (ref 135–145)
Total Bilirubin: 0.8 mg/dL (ref 0.3–1.2)
Total Protein: 7.3 g/dL (ref 6.5–8.1)

## 2020-12-17 NOTE — Progress Notes (Signed)
[place] [phone]   [phone]  OFFICE PROGRESS NOTE  [name], DO [address] [place] [address]  DIAGNOSIS: Recurrent/progressive non-small cell lung cancer initially diagnosed as stage IIIA(T2a, N2, M0) non-small cell lung cancer, squamous cell carcinoma diagnosed in April 2021, presented with right upper lobe/suprahilar lung mass in addition to right paratracheal and subcarinal lymphadenopathy.  Molecular Studies by [employer]: No actionable mutations  PRIOR THERAPY:  1) Concurrent chemoradiation with weekly carboplatin for AUC of 2 and paclitaxel 45 NG/M2. First dose on Mar 31, 2020. Status post 7 cycles. Last dose was given on 05/12/2020 with partial response. 2) Consolidation immunotherapy with durvalumab 1500 mg IV every 4 weeks. First dose May 12, 2020.Status post 3 cycles. 3) Systemic chemotherapy with carboplatin for an AUC of 5, paclitaxel 175 mg/m2, and Keytruda 200 mg IV every 3 weeks. First dose expected on 10/02/20.   Status post 3 cycles.  Last dose was given on November 13, 2020.  CURRENT THERAPY:  Second line systemic chemotherapy with docetaxel 75 Mg/M2 and Cyramza 10 mg/KG every 3 weeks with Neulasta support.  First dose December 11, 2020.   INTERVAL HISTORY: William Farrell 60 y.o. male returns to the clinic today for follow-up visit accompanied by his wife.  The patient continues to complain of increasing fatigue and weakness as well as aching pain after the Neulasta injection that lasted for several days.  He would like to take his Neulasta injection on the day after his chemotherapy at the [facility] med center if possible.  He denied having any current chest pain but has shortness of breath with exertion with mild cough and no hemoptysis.  He denied having any nausea, vomiting, diarrhea or constipation.  He has no headache or visual changes.  He lost around 5 pounds since his last  visit.  The patient is here today for evaluation and repeat blood work.  MEDICAL HISTORY: Past Medical History:  Diagnosis Date  . Asthma    as a child  . Diabetes ([place])   . Diverticulitis   . GERD (gastroesophageal reflux disease)   . High cholesterol   . History of kidney stones     ALLERGIES:  is allergic to sitagliptin.  MEDICATIONS:  Current Outpatient Medications  Medication Sig Dispense Refill  . acetaminophen (TYLENOL) 500 MG tablet Take 1,000 mg by mouth every 6 (six) hours as needed for mild pain.    Marland Kitchen albuterol (VENTOLIN HFA) 108 (90 Base) MCG/ACT inhaler Inhale 1-2 puffs into the lungs every 4 (four) hours as needed for wheezing or shortness of breath. 18 g 99  . aspirin EC 81 MG tablet Take 81 mg by mouth at bedtime.     Marland Kitchen atorvastatin (LIPITOR) 40 MG tablet TAKE 1 TABLET DAILY (Patient taking differently: Take 40 mg by mouth at bedtime.) 90 tablet 1  . calcium carbonate (TUMS - DOSED IN MG ELEMENTAL CALCIUM) 500 MG chewable tablet Chew 2 tablets by mouth 3 (three) times daily as needed for indigestion or heartburn.     . CHANTIX STARTING MONTH PAK 0.5 MG X 11 & 1 MG X 42 tablet TAKE ONE 0.5 MG TABLET BY MOUTH ONCE DAILY FOR 3 DAYS, THEN INCREASE TO ONE 0.5 MG TABLET TWICE DAILY FOR 4 DAYS, THEN INCREASE TO ONE 1 MG TABLET TWICE DAILY. 53 each 0  . dexamethasone (DECADRON) 4 MG tablet 1 tablet p.o. twice daily the day before, day of and  day after chemotherapy every 3 weeks. 40 tablet 0  . docusate sodium (COLACE) 100 MG capsule Take 100 mg by mouth daily.    Marland Kitchen glipiZIDE (GLUCOTROL) 5 MG tablet Take 1 tablet (5 mg total) by mouth 2 (two) times daily before a meal. 180 tablet 3  . lidocaine-prilocaine (EMLA) cream Apply 1 application topically as needed. 30 g 2  . meloxicam (MOBIC) 15 MG tablet TAKE 1 TABLET DAILY (Patient taking differently: Take 15 mg by mouth daily.) 90 tablet 3  . metFORMIN (GLUCOPHAGE) 1000 MG tablet TAKE 1 TABLET TWICE A DAY WITH MEALS (Patient taking  differently: Take 500 mg by mouth 2 (two) times daily with a meal.) 180 tablet 3  . OneTouch Delica Lancets 94R MISC As directed up to qid 100 each 99  . ONETOUCH ULTRA test strip USE AS INSTRUCTED UP TO FOUR TIMES A DAY 100 strip 13  . oxyCODONE-acetaminophen (PERCOCET/ROXICET) 5-325 MG tablet Take 1 tablet by mouth every 8 (eight) hours as needed for severe pain. 30 tablet 0  . prochlorperazine (COMPAZINE) 10 MG tablet Take 1 tablet (10 mg total) by mouth every 6 (six) hours as needed for nausea or vomiting. 30 tablet 2  . Semaglutide,0.25 or 0.5MG /DOS, (OZEMPIC, 0.25 OR 0.5 MG/DOSE,) 2 MG/1.5ML SOPN Inject 0.5 mg into the skin once a week. 7.5 mL 1  . trolamine salicylate (ASPERCREME) 10 % cream Apply 1 application topically 2 (two) times daily as needed for muscle pain.      No current facility-administered medications for this visit.    SURGICAL HISTORY:  Past Surgical History:  Procedure Laterality Date  . broken bone repair    . BRONCHIAL BIOPSY  03/11/2020   Procedure: BRONCHIAL BIOPSIES;  Surgeon: Garner Nash, DO;  Location: [facility];  Service: Pulmonary;;  . COLONOSCOPY    . CRYOTHERAPY  03/11/2020   Procedure: CRYOTHERAPY;  Surgeon: Garner Nash, DO;  Location: [facility];  Service: Pulmonary;;  . FINE NEEDLE ASPIRATION  03/11/2020   Procedure: FINE NEEDLE ASPIRATION (FNA) LINEAR;  Surgeon: Garner Nash, DO;  Location: [facility];  Service: Pulmonary;;  . IR IMAGING GUIDED PORT INSERTION  10/16/2020  . KNEE ARTHROSCOPY WITH ANTERIOR CRUCIATE LIGAMENT (ACL) REPAIR     x 2  . VIDEO BRONCHOSCOPY WITH ENDOBRONCHIAL ULTRASOUND N/A 03/11/2020   Procedure: VIDEO BRONCHOSCOPY WITH ENDOBRONCHIAL ULTRASOUND;  Surgeon: Garner Nash, DO;  Location: [facility];  Service: Pulmonary;  Laterality: N/A;    REVIEW OF SYSTEMS:  Constitutional: positive for fatigue and weight loss Eyes: negative Ears, nose, mouth, throat, and face: negative Respiratory: positive for  cough Cardiovascular: negative Gastrointestinal: negative Genitourinary:negative Integument/breast: negative Hematologic/lymphatic: negative Musculoskeletal:negative Neurological: negative Behavioral/Psych: negative Endocrine: negative Allergic/Immunologic: negative   PHYSICAL EXAMINATION: General appearance: alert, cooperative, fatigued and no distress Head: Normocephalic, without obvious abnormality, atraumatic Neck: no adenopathy, no JVD, supple, symmetrical, trachea midline and thyroid not enlarged, symmetric, no tenderness/mass/nodules Lymph nodes: Cervical, supraclavicular, and axillary nodes normal. Resp: clear to auscultation bilaterally Back: symmetric, no curvature. ROM normal. No CVA tenderness. Cardio: regular rate and rhythm, S1, S2 normal, no murmur, click, rub or gallop GI: soft, non-tender; bowel sounds normal; no masses,  no organomegaly Extremities: extremities normal, atraumatic, no cyanosis or edema Neurologic: Alert and oriented X 3, normal strength and tone. Normal symmetric reflexes. Normal coordination and gait  ECOG PERFORMANCE STATUS: 1 - Symptomatic but completely ambulatory  Blood pressure 121/84, pulse (!) 119, temperature 98.5 F (36.9 C), temperature source Tympanic, resp. rate  16, height 6' (1.829 m), weight 245 lb (111.1 kg), SpO2 100 %.  LABORATORY DATA: Lab Results  Component Value Date   WBC 3.7 (L) 12/17/2020   HGB 11.3 (L) 12/17/2020   HCT 33.1 (L) 12/17/2020   MCV 85.5 12/17/2020   PLT 153 12/17/2020      Chemistry      Component Value Date/Time   NA 134 (L) 12/17/2020 0913   K 4.5 12/17/2020 0913   CL 103 12/17/2020 0913   CO2 22 12/17/2020 0913   BUN 18 12/17/2020 0913   CREATININE 1.32 (H) 12/17/2020 0913   CREATININE 1.10 01/14/2020 0859      Component Value Date/Time   CALCIUM 9.0 12/17/2020 0913   ALKPHOS 151 (H) 12/17/2020 0913   AST 14 (L) 12/17/2020 0913   ALT 17 12/17/2020 0913   BILITOT 0.8 12/17/2020 0913        RADIOGRAPHIC STUDIES: CT Chest W Contrast  Result Date: 12/03/2020 CLINICAL DATA:  Non-small cell lung cancer staging in this 60 year old male found to have non-small cell lung cancer post chemo radiotherapy and immunotherapy, ongoing systemic therapy by report. EXAM: CT CHEST, ABDOMEN, AND PELVIS WITH CONTRAST TECHNIQUE: Multidetector CT imaging of the chest, abdomen and pelvis was performed following the standard protocol during bolus administration of intravenous contrast. CONTRAST:  1100mL OMNIPAQUE IOHEXOL 300 MG/ML  SOLN COMPARISON:  PET exam of September 16, 2020 FINDINGS: CT CHEST FINDINGS Cardiovascular: LEFT-sided Port-A-Cath terminates at the caval to atrial junction. Heart size is normal without sign of pericardial effusion. Aortic caliber is normal. Central pulmonary vasculature unremarkable on venous phase assessment. Mediastinum/Nodes: RIGHT subpectoral lymph node (image 6, series 2) 16 mm, previously 10 mm short axis RIGHT supraclavicular/lower neck lymph node (image 3, series 2) 16 mm previously approximately 14 mm short axis. RIGHT paratracheal lymph node (image 23, series 2) 13 mm short axis, previously 10 mm short axis. This is not a distinct lymph node but is stranding and soft tissue in the area of the RIGHT paratracheal chain. (Image 26, series 2) 12 mm subcarinal lymph node previously 7 mm. Consolidative changes about the RIGHT hilum contiguous with pulmonary lesions, see below. Lungs/Pleura: Enlarging RIGHT upper lobe pulmonary nodules, larger than in October of 2021. (Image 33, series 4) 16 mm RIGHT upper lobe pulmonary nodule previously 13 mm. Smaller nodule seen posterior to this area now nearly contiguous/confluency with the dominant upper lobe nodule now measuring 14 mm (image 30, series 4) previously 7 mm, measurements from October of 2019 which were also similar on the prior PET. Ovoid RIGHT juxta hilar mass which is increased in size and is difficult to separate from  surrounding consolidative post treatment changes (image 51, series 4) 31 x 27 mm, previously 25 x 20 mm. No new consolidation elsewhere in the chest. No sign of pleural effusion. Apical scarring on the LEFT with similar appearance. Airways grossly patent aside from some narrowing of airways in the RIGHT hilum due to post treatment changes. Musculoskeletal: Subtle sclerotic focus in the RIGHT paramidline sternum (image 70, series 4) not changed since previous imaging, a very subtle finding without signs of FDG uptake on prior PET. Spinal degenerative changes. See below for full musculoskeletal details. CT ABDOMEN PELVIS FINDINGS Hepatobiliary: Subtle area of low attenuation in the RIGHT hepatic lobe (image 64, series 2) 6 mm not definitely seen on previous imaging though potentially present on prior PET and without increased FDG uptake is indeterminate no pericholecystic stranding. No biliary duct dilation. Portal vein is  patent. Pancreas: Normal, without mass, inflammation or ductal dilatation. Spleen: Normal Adrenals/Urinary Tract: Adrenal glands are normal. Symmetric renal enhancement. Stable perinephric stranding, a chronic finding and not associated with hydronephrosis. Urinary bladder decompressed limiting assessment. Grossly normal. Stomach/Bowel: No acute gastrointestinal process. Colonic diverticulosis Vascular/Lymphatic: Calcified and noncalcified atheromatous plaque of the abdominal aorta. There is no gastrohepatic or hepatoduodenal ligament lymphadenopathy. No retroperitoneal or mesenteric lymphadenopathy. Reproductive: Prostate unremarkable by CT. No pelvic sidewall lymphadenopathy. Other: Moderate bilateral fat containing inguinal hernias. No ascites. Musculoskeletal: No acute musculoskeletal finding or destructive bone process. Subtle very small sclerotic focus in the sternum less than a cm as described above. IMPRESSION: 1. Enlarging nodules and masses in the chest with enlarging lymph nodes as  described. Findings of worsening disease in the chest as described. 2. Subtle area of low attenuation in the RIGHT hepatic lobe not definitely seen on previous imaging though potentially present on prior PET and without increased FDG uptake is indeterminate. Attention on follow-up. 3. Subtle sclerotic focus in the RIGHT sternum, attention on follow-up. 4. No additional signs of disease in the chest, abdomen or pelvis. 5. Aortic atherosclerosis. 6. Moderate bilateral fat containing inguinal hernias. Aortic Atherosclerosis (ICD10-I70.0). Electronically Signed   By: Zetta Bills M.D.   On: 12/03/2020 14:33   CT Abdomen Pelvis W Contrast  Result Date: 12/03/2020 CLINICAL DATA:  Non-small cell lung cancer staging in this 60 year old male found to have non-small cell lung cancer post chemo radiotherapy and immunotherapy, ongoing systemic therapy by report. EXAM: CT CHEST, ABDOMEN, AND PELVIS WITH CONTRAST TECHNIQUE: Multidetector CT imaging of the chest, abdomen and pelvis was performed following the standard protocol during bolus administration of intravenous contrast. CONTRAST:  14mL OMNIPAQUE IOHEXOL 300 MG/ML  SOLN COMPARISON:  PET exam of September 16, 2020 FINDINGS: CT CHEST FINDINGS Cardiovascular: LEFT-sided Port-A-Cath terminates at the caval to atrial junction. Heart size is normal without sign of pericardial effusion. Aortic caliber is normal. Central pulmonary vasculature unremarkable on venous phase assessment. Mediastinum/Nodes: RIGHT subpectoral lymph node (image 6, series 2) 16 mm, previously 10 mm short axis RIGHT supraclavicular/lower neck lymph node (image 3, series 2) 16 mm previously approximately 14 mm short axis. RIGHT paratracheal lymph node (image 23, series 2) 13 mm short axis, previously 10 mm short axis. This is not a distinct lymph node but is stranding and soft tissue in the area of the RIGHT paratracheal chain. (Image 26, series 2) 12 mm subcarinal lymph node previously 7 mm.  Consolidative changes about the RIGHT hilum contiguous with pulmonary lesions, see below. Lungs/Pleura: Enlarging RIGHT upper lobe pulmonary nodules, larger than in October of 2021. (Image 33, series 4) 16 mm RIGHT upper lobe pulmonary nodule previously 13 mm. Smaller nodule seen posterior to this area now nearly contiguous/confluency with the dominant upper lobe nodule now measuring 14 mm (image 30, series 4) previously 7 mm, measurements from October of 2019 which were also similar on the prior PET. Ovoid RIGHT juxta hilar mass which is increased in size and is difficult to separate from surrounding consolidative post treatment changes (image 51, series 4) 31 x 27 mm, previously 25 x 20 mm. No new consolidation elsewhere in the chest. No sign of pleural effusion. Apical scarring on the LEFT with similar appearance. Airways grossly patent aside from some narrowing of airways in the RIGHT hilum due to post treatment changes. Musculoskeletal: Subtle sclerotic focus in the RIGHT paramidline sternum (image 70, series 4) not changed since previous imaging, a very subtle finding without signs of  FDG uptake on prior PET. Spinal degenerative changes. See below for full musculoskeletal details. CT ABDOMEN PELVIS FINDINGS Hepatobiliary: Subtle area of low attenuation in the RIGHT hepatic lobe (image 64, series 2) 6 mm not definitely seen on previous imaging though potentially present on prior PET and without increased FDG uptake is indeterminate no pericholecystic stranding. No biliary duct dilation. Portal vein is patent. Pancreas: Normal, without mass, inflammation or ductal dilatation. Spleen: Normal Adrenals/Urinary Tract: Adrenal glands are normal. Symmetric renal enhancement. Stable perinephric stranding, a chronic finding and not associated with hydronephrosis. Urinary bladder decompressed limiting assessment. Grossly normal. Stomach/Bowel: No acute gastrointestinal process. Colonic diverticulosis Vascular/Lymphatic:  Calcified and noncalcified atheromatous plaque of the abdominal aorta. There is no gastrohepatic or hepatoduodenal ligament lymphadenopathy. No retroperitoneal or mesenteric lymphadenopathy. Reproductive: Prostate unremarkable by CT. No pelvic sidewall lymphadenopathy. Other: Moderate bilateral fat containing inguinal hernias. No ascites. Musculoskeletal: No acute musculoskeletal finding or destructive bone process. Subtle very small sclerotic focus in the sternum less than a cm as described above. IMPRESSION: 1. Enlarging nodules and masses in the chest with enlarging lymph nodes as described. Findings of worsening disease in the chest as described. 2. Subtle area of low attenuation in the RIGHT hepatic lobe not definitely seen on previous imaging though potentially present on prior PET and without increased FDG uptake is indeterminate. Attention on follow-up. 3. Subtle sclerotic focus in the RIGHT sternum, attention on follow-up. 4. No additional signs of disease in the chest, abdomen or pelvis. 5. Aortic atherosclerosis. 6. Moderate bilateral fat containing inguinal hernias. Aortic Atherosclerosis (ICD10-I70.0). Electronically Signed   By: Zetta Bills M.D.   On: 12/03/2020 14:33    ASSESSMENT AND PLAN: This is a 60 years old white male was recently diagnosed with stage IIIa non-small cell lung cancer, squamous cell carcinoma in April 2021 presented with right upper lobe/suprahilar lung mass in addition to right paratracheal and subcarinal lymphadenopathy. The patient completed a course of concurrent chemoradiation with weekly carboplatin for AUC of 2 and paclitaxel 45 MG/M2.  He is status post 7 cycles.  He tolerated the previous course of his treatment well except for mild dysphagia and odynophagia. He underwent consolidation treatment with immunotherapy with Imfinzi 1500 mg IV every 4 weeks.  Status post 3 cycles.  The patient tolerated the treatment well but unfortunately he has evidence for disease  progression after cycle #3. He is started first-line treatment with chemotherapy with carboplatin for AUC of 5, paclitaxel 175 mg/M2 and Keytruda 200 mg IV every 3 weeks.  Status post 3 cycles.  He has been tolerating this treatment well with no concerning adverse effects except for mild fatigue. The patient had repeat CT scan of the chest, abdomen pelvis performed recently.  I personally and independently reviewed the scan images and discussed the result and showed the images to the patient today. Unfortunately his scan showed enlarging nodules and masses in the chest with enlarging lymph nodes.  This is definitely highly suspicious for disease progression but to the progression on immunotherapy could not be completely excluded. He is currently undergoing palliative systemic chemotherapy with second line docetaxel 75 Mg/M2 and Cyramza 10 mg/KG every 3 weeks with Neulasta support.  Status post 1 cycle.  He has a rough time with the first cycle of his treatment because of the fatigue and weakness as well as aching pain after the Neulasta injection. The patient was advised to take his pain medication as well as Claritin as needed. I will see him back for follow-up  visit in 2 weeks for evaluation before starting cycle #2 of his treatment.  I will try to arrange for his Neulasta injection to be done the following day in the afternoon. The patient was advised to call immediately if he has any other concerning symptoms in the interval. The patient voices understanding of current disease status and treatment options and is in agreement with the current care plan. All questions were answered. The patient knows to call the clinic with any problems, questions or concerns. We can certainly see the patient much sooner if necessary.  Disclaimer: This note was dictated with voice recognition software. Similar sounding words can inadvertently be transcribed and may not be corrected upon review.

## 2020-12-18 ENCOUNTER — Telehealth: Payer: Self-pay

## 2020-12-18 ENCOUNTER — Other Ambulatory Visit: Payer: BC Managed Care – PPO

## 2020-12-18 NOTE — Telephone Encounter (Signed)
01/02/21 inj made per secure chat from [facility], they will make the pt aware   William Farrell

## 2020-12-18 NOTE — Telephone Encounter (Signed)
Per inbasket message from [facility] all lab/flush appts have been moved here, message back to [place] to advise pt

## 2020-12-23 ENCOUNTER — Other Ambulatory Visit: Payer: Self-pay | Admitting: Physician Assistant

## 2020-12-23 DIAGNOSIS — Z5111 Encounter for antineoplastic chemotherapy: Secondary | ICD-10-CM

## 2020-12-24 ENCOUNTER — Other Ambulatory Visit: Payer: BC Managed Care – PPO

## 2020-12-25 ENCOUNTER — Other Ambulatory Visit: Payer: BC Managed Care – PPO

## 2020-12-25 ENCOUNTER — Ambulatory Visit: Payer: BC Managed Care – PPO | Admitting: Internal Medicine

## 2020-12-25 ENCOUNTER — Ambulatory Visit: Payer: BC Managed Care – PPO

## 2020-12-25 ENCOUNTER — Inpatient Hospital Stay: Payer: BC Managed Care – PPO

## 2020-12-25 ENCOUNTER — Other Ambulatory Visit: Payer: Self-pay

## 2020-12-25 DIAGNOSIS — R059 Cough, unspecified: Secondary | ICD-10-CM | POA: Diagnosis not present

## 2020-12-25 DIAGNOSIS — Z5111 Encounter for antineoplastic chemotherapy: Secondary | ICD-10-CM | POA: Diagnosis not present

## 2020-12-25 DIAGNOSIS — G4700 Insomnia, unspecified: Secondary | ICD-10-CM | POA: Diagnosis not present

## 2020-12-25 DIAGNOSIS — Z5112 Encounter for antineoplastic immunotherapy: Secondary | ICD-10-CM | POA: Diagnosis not present

## 2020-12-25 DIAGNOSIS — Z79899 Other long term (current) drug therapy: Secondary | ICD-10-CM | POA: Diagnosis not present

## 2020-12-25 DIAGNOSIS — Z7982 Long term (current) use of aspirin: Secondary | ICD-10-CM | POA: Diagnosis not present

## 2020-12-25 DIAGNOSIS — R5383 Other fatigue: Secondary | ICD-10-CM | POA: Diagnosis not present

## 2020-12-25 DIAGNOSIS — C3411 Malignant neoplasm of upper lobe, right bronchus or lung: Secondary | ICD-10-CM

## 2020-12-25 DIAGNOSIS — Z923 Personal history of irradiation: Secondary | ICD-10-CM | POA: Diagnosis not present

## 2020-12-25 DIAGNOSIS — Z5189 Encounter for other specified aftercare: Secondary | ICD-10-CM | POA: Diagnosis not present

## 2020-12-25 LAB — CMP (CANCER CENTER ONLY)
ALT: 22 U/L (ref 0–44)
AST: 16 U/L (ref 15–41)
Albumin: 3.8 g/dL (ref 3.5–5.0)
Alkaline Phosphatase: 141 U/L — ABNORMAL HIGH (ref 38–126)
Anion gap: 9 (ref 5–15)
BUN: 10 mg/dL (ref 6–20)
CO2: 26 mmol/L (ref 22–32)
Calcium: 9.1 mg/dL (ref 8.9–10.3)
Chloride: 102 mmol/L (ref 98–111)
Creatinine: 1.13 mg/dL (ref 0.61–1.24)
GFR, Estimated: >60 mL/min (ref >60)
Glucose, Bld: 217 mg/dL — ABNORMAL HIGH (ref 70–99)
Potassium: 4.6 mmol/L (ref 3.5–5.1)
Sodium: 137 mmol/L (ref 135–145)
Total Bilirubin: 0.4 mg/dL (ref 0.3–1.2)
Total Protein: 6.6 g/dL (ref 6.5–8.1)

## 2020-12-25 LAB — CBC WITH DIFFERENTIAL (CANCER CENTER ONLY)
Abs Immature Granulocytes: 0.1 10*3/uL — ABNORMAL HIGH (ref 0.00–0.07)
Basophils Absolute: 0.1 10*3/uL (ref 0.0–0.1)
Basophils Relative: 1 %
Eosinophils Absolute: 0.2 10*3/uL (ref 0.0–0.5)
Eosinophils Relative: 1 %
HCT: 33.3 % — ABNORMAL LOW (ref 39.0–52.0)
Hemoglobin: 10.8 g/dL — ABNORMAL LOW (ref 13.0–17.0)
Immature Granulocytes: 1 %
Lymphocytes Relative: 10 %
Lymphs Abs: 1.2 10*3/uL (ref 0.7–4.0)
MCH: 29.3 pg (ref 26.0–34.0)
MCHC: 32.4 g/dL (ref 30.0–36.0)
MCV: 90.5 fL (ref 80.0–100.0)
Monocytes Absolute: 1.1 10*3/uL — ABNORMAL HIGH (ref 0.1–1.0)
Monocytes Relative: 9 %
Neutro Abs: 9.7 10*3/uL — ABNORMAL HIGH (ref 1.7–7.7)
Neutrophils Relative %: 78 %
Platelet Count: 179 10*3/uL (ref 150–400)
RBC: 3.68 MIL/uL — ABNORMAL LOW (ref 4.22–5.81)
RDW: 18.6 % — ABNORMAL HIGH (ref 11.5–15.5)
WBC Count: 12.3 10*3/uL — ABNORMAL HIGH (ref 4.0–10.5)
nRBC: 0 % (ref 0.0–0.2)

## 2020-12-25 LAB — TOTAL PROTEIN, URINE DIPSTICK: Protein, ur: NEGATIVE mg/dL

## 2020-12-31 ENCOUNTER — Other Ambulatory Visit: Payer: BC Managed Care – PPO

## 2021-01-01 ENCOUNTER — Other Ambulatory Visit: Payer: Self-pay | Admitting: Physician Assistant

## 2021-01-01 ENCOUNTER — Other Ambulatory Visit: Payer: Self-pay

## 2021-01-01 ENCOUNTER — Inpatient Hospital Stay (HOSPITAL_BASED_OUTPATIENT_CLINIC_OR_DEPARTMENT_OTHER): Payer: BC Managed Care – PPO | Admitting: Internal Medicine

## 2021-01-01 ENCOUNTER — Telehealth: Payer: Self-pay | Admitting: Internal Medicine

## 2021-01-01 ENCOUNTER — Other Ambulatory Visit: Payer: BC Managed Care – PPO

## 2021-01-01 ENCOUNTER — Inpatient Hospital Stay: Payer: BC Managed Care – PPO

## 2021-01-01 ENCOUNTER — Encounter: Payer: Self-pay | Admitting: Internal Medicine

## 2021-01-01 VITALS — BP 121/80 | HR 109 | Temp 96.6°F | Resp 17 | Ht 72.0 in | Wt 244.5 lb

## 2021-01-01 DIAGNOSIS — F5102 Adjustment insomnia: Secondary | ICD-10-CM | POA: Diagnosis not present

## 2021-01-01 DIAGNOSIS — E119 Type 2 diabetes mellitus without complications: Secondary | ICD-10-CM

## 2021-01-01 DIAGNOSIS — G4700 Insomnia, unspecified: Secondary | ICD-10-CM | POA: Insufficient documentation

## 2021-01-01 DIAGNOSIS — Z923 Personal history of irradiation: Secondary | ICD-10-CM | POA: Diagnosis not present

## 2021-01-01 DIAGNOSIS — Z5112 Encounter for antineoplastic immunotherapy: Secondary | ICD-10-CM | POA: Diagnosis not present

## 2021-01-01 DIAGNOSIS — Z5111 Encounter for antineoplastic chemotherapy: Secondary | ICD-10-CM | POA: Diagnosis not present

## 2021-01-01 DIAGNOSIS — C3411 Malignant neoplasm of upper lobe, right bronchus or lung: Secondary | ICD-10-CM | POA: Diagnosis not present

## 2021-01-01 DIAGNOSIS — Z5189 Encounter for other specified aftercare: Secondary | ICD-10-CM | POA: Diagnosis not present

## 2021-01-01 DIAGNOSIS — Z79899 Other long term (current) drug therapy: Secondary | ICD-10-CM | POA: Diagnosis not present

## 2021-01-01 DIAGNOSIS — Z7982 Long term (current) use of aspirin: Secondary | ICD-10-CM | POA: Diagnosis not present

## 2021-01-01 DIAGNOSIS — R5383 Other fatigue: Secondary | ICD-10-CM | POA: Diagnosis not present

## 2021-01-01 DIAGNOSIS — R059 Cough, unspecified: Secondary | ICD-10-CM | POA: Diagnosis not present

## 2021-01-01 LAB — CBC WITH DIFFERENTIAL (CANCER CENTER ONLY)
Abs Immature Granulocytes: 0.09 10*3/uL — ABNORMAL HIGH (ref 0.00–0.07)
Basophils Absolute: 0.1 10*3/uL (ref 0.0–0.1)
Basophils Relative: 0 %
Eosinophils Absolute: 0 10*3/uL (ref 0.0–0.5)
Eosinophils Relative: 0 %
HCT: 33 % — ABNORMAL LOW (ref 39.0–52.0)
Hemoglobin: 10.8 g/dL — ABNORMAL LOW (ref 13.0–17.0)
Immature Granulocytes: 1 %
Lymphocytes Relative: 5 %
Lymphs Abs: 0.9 10*3/uL (ref 0.7–4.0)
MCH: 29.3 pg (ref 26.0–34.0)
MCHC: 32.7 g/dL (ref 30.0–36.0)
MCV: 89.7 fL (ref 80.0–100.0)
Monocytes Absolute: 0.8 10*3/uL (ref 0.1–1.0)
Monocytes Relative: 5 %
Neutro Abs: 15.7 10*3/uL — ABNORMAL HIGH (ref 1.7–7.7)
Neutrophils Relative %: 89 %
Platelet Count: 295 10*3/uL (ref 150–400)
RBC: 3.68 MIL/uL — ABNORMAL LOW (ref 4.22–5.81)
RDW: 18 % — ABNORMAL HIGH (ref 11.5–15.5)
WBC Count: 17.5 10*3/uL — ABNORMAL HIGH (ref 4.0–10.5)
nRBC: 0 % (ref 0.0–0.2)

## 2021-01-01 LAB — CMP (CANCER CENTER ONLY)
ALT: 21 U/L (ref 0–44)
AST: 11 U/L — ABNORMAL LOW (ref 15–41)
Albumin: 3.5 g/dL (ref 3.5–5.0)
Alkaline Phosphatase: 129 U/L — ABNORMAL HIGH (ref 38–126)
Anion gap: 10 (ref 5–15)
BUN: 21 mg/dL — ABNORMAL HIGH (ref 6–20)
CO2: 20 mmol/L — ABNORMAL LOW (ref 22–32)
Calcium: 9.6 mg/dL (ref 8.9–10.3)
Chloride: 102 mmol/L (ref 98–111)
Creatinine: 1.32 mg/dL — ABNORMAL HIGH (ref 0.61–1.24)
GFR, Estimated: >60 mL/min (ref >60)
Glucose, Bld: 397 mg/dL — ABNORMAL HIGH (ref 70–99)
Potassium: 4.5 mmol/L (ref 3.5–5.1)
Sodium: 132 mmol/L — ABNORMAL LOW (ref 135–145)
Total Bilirubin: 0.4 mg/dL (ref 0.3–1.2)
Total Protein: 7.4 g/dL (ref 6.5–8.1)

## 2021-01-01 LAB — GLUCOSE, CAPILLARY: Glucose-Capillary: 298 mg/dL — ABNORMAL HIGH (ref 70–99)

## 2021-01-01 MED ORDER — DIPHENHYDRAMINE HCL 50 MG/ML IJ SOLN
INTRAMUSCULAR | Status: AC
  Filled 2021-01-01: qty 1

## 2021-01-01 MED ORDER — ACETAMINOPHEN 325 MG PO TABS
ORAL_TABLET | ORAL | Status: AC
  Filled 2021-01-01: qty 2

## 2021-01-01 MED ORDER — SODIUM CHLORIDE 0.9 % IV SOLN
75.0000 mg/m2 | Freq: Once | INTRAVENOUS | Status: AC
  Administered 2021-01-01: 180 mg via INTRAVENOUS
  Filled 2021-01-01: qty 18

## 2021-01-01 MED ORDER — SODIUM CHLORIDE 0.9 % IV SOLN
10.0000 mg/kg | Freq: Once | INTRAVENOUS | Status: AC
  Administered 2021-01-01: 1100 mg via INTRAVENOUS
  Filled 2021-01-01: qty 100

## 2021-01-01 MED ORDER — ACETAMINOPHEN 325 MG PO TABS
650.0000 mg | ORAL_TABLET | Freq: Once | ORAL | Status: AC
  Administered 2021-01-01: 650 mg via ORAL

## 2021-01-01 MED ORDER — INSULIN ASPART 100 UNIT/ML ~~LOC~~ SOLN
8.0000 [IU] | Freq: Once | SUBCUTANEOUS | Status: AC
  Administered 2021-01-01: 8 [IU] via SUBCUTANEOUS

## 2021-01-01 MED ORDER — INSULIN ASPART 100 UNIT/ML ~~LOC~~ SOLN
SUBCUTANEOUS | Status: AC
  Filled 2021-01-01: qty 1

## 2021-01-01 MED ORDER — DIPHENHYDRAMINE HCL 50 MG/ML IJ SOLN
50.0000 mg | Freq: Once | INTRAMUSCULAR | Status: AC
  Administered 2021-01-01: 50 mg via INTRAVENOUS

## 2021-01-01 MED ORDER — SODIUM CHLORIDE 0.9 % IV SOLN
Freq: Once | INTRAVENOUS | Status: AC
Start: 2021-01-01 — End: 2021-01-01
  Filled 2021-01-01: qty 250

## 2021-01-01 MED ORDER — TEMAZEPAM 15 MG PO CAPS: 15.0000 mg | ORAL_CAPSULE | Freq: Every evening | ORAL | 0 refills | Status: DC | PRN

## 2021-01-01 MED ORDER — SODIUM CHLORIDE 0.9% FLUSH
10.0000 mL | INTRAVENOUS | Status: DC | PRN
  Administered 2021-01-01: 10 mL
  Filled 2021-01-01: qty 10

## 2021-01-01 MED ORDER — HEPARIN SOD (PORK) LOCK FLUSH 100 UNIT/ML IV SOLN
500.0000 [IU] | Freq: Once | INTRAVENOUS | Status: AC | PRN
  Administered 2021-01-01: 500 [IU]
  Filled 2021-01-01: qty 5

## 2021-01-01 MED ORDER — SODIUM CHLORIDE 0.9 % IV SOLN
10.0000 mg | Freq: Once | INTRAVENOUS | Status: AC
  Administered 2021-01-01: 10 mg via INTRAVENOUS
  Filled 2021-01-01: qty 10

## 2021-01-01 NOTE — Progress Notes (Signed)
Per Cassie Heilingoetter, PA, ok to treat with elevated HR and BG. Will receive 8u insulin during treatment.

## 2021-01-01 NOTE — Patient Instructions (Signed)
[facility] Discharge Instructions for Patients Receiving Chemotherapy  Today you received the following chemotherapy agents: ramucirumab/docetaxel.  To help prevent nausea and vomiting after your treatment, we encourage you to take your nausea medication as directed.   If you develop nausea and vomiting that is not controlled by your nausea medication, call the clinic.   BELOW ARE SYMPTOMS THAT SHOULD BE REPORTED IMMEDIATELY:  *FEVER GREATER THAN 100.5 F  *CHILLS WITH OR WITHOUT FEVER  NAUSEA AND VOMITING THAT IS NOT CONTROLLED WITH YOUR NAUSEA MEDICATION  *UNUSUAL SHORTNESS OF BREATH  *UNUSUAL BRUISING OR BLEEDING  TENDERNESS IN MOUTH AND THROAT WITH OR WITHOUT PRESENCE OF ULCERS  *URINARY PROBLEMS  *BOWEL PROBLEMS  UNUSUAL RASH Items with * indicate a potential emergency and should be followed up as soon as possible.  Feel free to call the clinic should you have any questions or concerns. The clinic phone number is [phone].  Please show the CHEMO ALERT CARD at check-in to the Emergency Department and triage nurse.   

## 2021-01-01 NOTE — Progress Notes (Signed)
[facility] [phone]   [phone]  OFFICE PROGRESS NOTE  [name], DO [place] [address] [place] [address]  DIAGNOSIS: Recurrent/progressive non-small cell lung cancer initially diagnosed as stage IIIA(T2a, N2, M0) non-small cell lung cancer, squamous cell carcinoma diagnosed in April 2021, presented with right upper lobe/suprahilar lung mass in addition to right paratracheal and subcarinal lymphadenopathy.  Molecular Studies by [employer]: No actionable mutations  PRIOR THERAPY:  1) Concurrent chemoradiation with weekly carboplatin for AUC of 2 and paclitaxel 45 NG/M2. First dose on Mar 31, 2020. Status post 7 cycles. Last dose was given on 05/12/2020 with partial response. 2) Consolidation immunotherapy with durvalumab 1500 mg IV every 4 weeks. First dose May 12, 2020.Status post 3 cycles. 3) Systemic chemotherapy with carboplatin for an AUC of 5, paclitaxel 175 mg/m2, and Keytruda 200 mg IV every 3 weeks. First dose expected on 10/02/20.   Status post 3 cycles.  Last dose was given on November 13, 2020.  CURRENT THERAPY:  Second line systemic chemotherapy with docetaxel 75 Mg/M2 and Cyramza 10 mg/KG every 3 weeks with Neulasta support.  First dose December 11, 2020.  Status post 1 cycle.   INTERVAL HISTORY: William Farrell 60 y.o. male returns to the clinic today for follow-up visit.  The patient is feeling fine today with no concerning complaints except for insomnia and cough with hard to clear his mucus.  He denied having any shortness of breath except with exertion.  He denied having any fever or chills.  He has no nausea, vomiting, diarrhea or constipation.  He has no headache or visual changes.  He tolerated the first cycle of his treatment fairly well.  He is here today for evaluation before starting cycle #2 of his treatment.  MEDICAL HISTORY: Past Medical History:  Diagnosis Date  . Asthma    as a  child  . Diabetes ([place])   . Diverticulitis   . GERD (gastroesophageal reflux disease)   . High cholesterol   . History of kidney stones     ALLERGIES:  is allergic to sitagliptin.  MEDICATIONS:  Current Outpatient Medications  Medication Sig Dispense Refill  . acetaminophen (TYLENOL) 500 MG tablet Take 1,000 mg by mouth every 6 (six) hours as needed for mild pain.    Marland Kitchen albuterol (VENTOLIN HFA) 108 (90 Base) MCG/ACT inhaler Inhale 1-2 puffs into the lungs every 4 (four) hours as needed for wheezing or shortness of breath. 18 g 99  . aspirin EC 81 MG tablet Take 81 mg by mouth at bedtime.     Marland Kitchen atorvastatin (LIPITOR) 40 MG tablet TAKE 1 TABLET DAILY (Patient taking differently: Take 40 mg by mouth at bedtime.) 90 tablet 1  . calcium carbonate (TUMS - DOSED IN MG ELEMENTAL CALCIUM) 500 MG chewable tablet Chew 2 tablets by mouth 3 (three) times daily as needed for indigestion or heartburn.     . CHANTIX STARTING MONTH PAK 0.5 MG X 11 & 1 MG X 42 tablet TAKE ONE 0.5 MG TABLET BY MOUTH ONCE DAILY FOR 3 DAYS, THEN INCREASE TO ONE 0.5 MG TABLET TWICE DAILY FOR 4 DAYS, THEN INCREASE TO ONE 1 MG TABLET TWICE DAILY. 53 each 0  . dexamethasone (DECADRON) 4 MG tablet 1 tablet p.o. twice daily the day before, day of and day after chemotherapy every 3 weeks. 40 tablet 0  . docusate sodium (COLACE) 100 MG capsule Take 100 mg by mouth daily.    Marland Kitchen  glipiZIDE (GLUCOTROL) 5 MG tablet Take 1 tablet (5 mg total) by mouth 2 (two) times daily before a meal. 180 tablet 3  . lidocaine-prilocaine (EMLA) cream Apply 1 application topically as needed. 30 g 2  . meloxicam (MOBIC) 15 MG tablet TAKE 1 TABLET DAILY (Patient taking differently: Take 15 mg by mouth daily.) 90 tablet 3  . metFORMIN (GLUCOPHAGE) 1000 MG tablet TAKE 1 TABLET TWICE A DAY WITH MEALS (Patient taking differently: Take 500 mg by mouth 2 (two) times daily with a meal.) 180 tablet 3  . OneTouch Delica Lancets 53G MISC As directed up to qid 100 each 99   . ONETOUCH ULTRA test strip USE AS INSTRUCTED UP TO FOUR TIMES A DAY 100 strip 13  . oxyCODONE-acetaminophen (PERCOCET/ROXICET) 5-325 MG tablet Take 1 tablet by mouth every 8 (eight) hours as needed for severe pain. 30 tablet 0  . prochlorperazine (COMPAZINE) 10 MG tablet TAKE 1 TABLET BY MOUTH EVERY 6 HOURS AS NEEDED FOR NAUSEA OR VOMITING. 30 tablet 2  . Semaglutide,0.25 or 0.5MG /DOS, (OZEMPIC, 0.25 OR 0.5 MG/DOSE,) 2 MG/1.5ML SOPN Inject 0.5 mg into the skin once a week. 7.5 mL 1  . trolamine salicylate (ASPERCREME) 10 % cream Apply 1 application topically 2 (two) times daily as needed for muscle pain.      No current facility-administered medications for this visit.    SURGICAL HISTORY:  Past Surgical History:  Procedure Laterality Date  . broken bone repair    . BRONCHIAL BIOPSY  03/11/2020   Procedure: BRONCHIAL BIOPSIES;  Surgeon: Garner Nash, DO;  Location: [facility];  Service: Pulmonary;;  . COLONOSCOPY    . CRYOTHERAPY  03/11/2020   Procedure: CRYOTHERAPY;  Surgeon: Garner Nash, DO;  Location: [facility];  Service: Pulmonary;;  . FINE NEEDLE ASPIRATION  03/11/2020   Procedure: FINE NEEDLE ASPIRATION (FNA) LINEAR;  Surgeon: Garner Nash, DO;  Location: [facility];  Service: Pulmonary;;  . IR IMAGING GUIDED PORT INSERTION  10/16/2020  . KNEE ARTHROSCOPY WITH ANTERIOR CRUCIATE LIGAMENT (ACL) REPAIR     x 2  . VIDEO BRONCHOSCOPY WITH ENDOBRONCHIAL ULTRASOUND N/A 03/11/2020   Procedure: VIDEO BRONCHOSCOPY WITH ENDOBRONCHIAL ULTRASOUND;  Surgeon: Garner Nash, DO;  Location: [facility];  Service: Pulmonary;  Laterality: N/A;    REVIEW OF SYSTEMS:  Constitutional: positive for fatigue Eyes: negative Ears, nose, mouth, throat, and face: negative Respiratory: positive for cough Cardiovascular: negative Gastrointestinal: negative Genitourinary:negative Integument/breast: negative Hematologic/lymphatic: negative Musculoskeletal:negative Neurological:  negative Behavioral/Psych: positive for sleep disturbance Endocrine: negative Allergic/Immunologic: negative   PHYSICAL EXAMINATION: General appearance: alert, cooperative, fatigued and no distress Head: Normocephalic, without obvious abnormality, atraumatic Neck: no adenopathy, no JVD, supple, symmetrical, trachea midline and thyroid not enlarged, symmetric, no tenderness/mass/nodules Lymph nodes: Cervical, supraclavicular, and axillary nodes normal. Resp: clear to auscultation bilaterally Back: symmetric, no curvature. ROM normal. No CVA tenderness. Cardio: regular rate and rhythm, S1, S2 normal, no murmur, click, rub or gallop GI: soft, non-tender; bowel sounds normal; no masses,  no organomegaly Extremities: extremities normal, atraumatic, no cyanosis or edema Neurologic: Alert and oriented X 3, normal strength and tone. Normal symmetric reflexes. Normal coordination and gait  ECOG PERFORMANCE STATUS: 1 - Symptomatic but completely ambulatory  Blood pressure 121/80, pulse (!) 109, temperature (!) 96.6 F (35.9 C), temperature source Tympanic, resp. rate 17, height 6' (1.829 m), weight 244 lb 8 oz (110.9 kg), SpO2 99 %.  LABORATORY DATA: Lab Results  Component Value Date   WBC 12.3 (H) 12/25/2020  HGB 10.8 (L) 12/25/2020   HCT 33.3 (L) 12/25/2020   MCV 90.5 12/25/2020   PLT 179 12/25/2020      Chemistry      Component Value Date/Time   NA 137 12/25/2020 0832   K 4.6 12/25/2020 0832   CL 102 12/25/2020 0832   CO2 26 12/25/2020 0832   BUN 10 12/25/2020 0832   CREATININE 1.13 12/25/2020 0832   CREATININE 1.10 01/14/2020 0859      Component Value Date/Time   CALCIUM 9.1 12/25/2020 0832   ALKPHOS 141 (H) 12/25/2020 0832   AST 16 12/25/2020 0832   ALT 22 12/25/2020 0832   BILITOT 0.4 12/25/2020 0832       RADIOGRAPHIC STUDIES: CT Chest W Contrast  Result Date: 12/03/2020 CLINICAL DATA:  Non-small cell lung cancer staging in this 60 year old male found to have  non-small cell lung cancer post chemo radiotherapy and immunotherapy, ongoing systemic therapy by report. EXAM: CT CHEST, ABDOMEN, AND PELVIS WITH CONTRAST TECHNIQUE: Multidetector CT imaging of the chest, abdomen and pelvis was performed following the standard protocol during bolus administration of intravenous contrast. CONTRAST:  137mL OMNIPAQUE IOHEXOL 300 MG/ML  SOLN COMPARISON:  PET exam of September 16, 2020 FINDINGS: CT CHEST FINDINGS Cardiovascular: LEFT-sided Port-A-Cath terminates at the caval to atrial junction. Heart size is normal without sign of pericardial effusion. Aortic caliber is normal. Central pulmonary vasculature unremarkable on venous phase assessment. Mediastinum/Nodes: RIGHT subpectoral lymph node (image 6, series 2) 16 mm, previously 10 mm short axis RIGHT supraclavicular/lower neck lymph node (image 3, series 2) 16 mm previously approximately 14 mm short axis. RIGHT paratracheal lymph node (image 23, series 2) 13 mm short axis, previously 10 mm short axis. This is not a distinct lymph node but is stranding and soft tissue in the area of the RIGHT paratracheal chain. (Image 26, series 2) 12 mm subcarinal lymph node previously 7 mm. Consolidative changes about the RIGHT hilum contiguous with pulmonary lesions, see below. Lungs/Pleura: Enlarging RIGHT upper lobe pulmonary nodules, larger than in October of 2021. (Image 33, series 4) 16 mm RIGHT upper lobe pulmonary nodule previously 13 mm. Smaller nodule seen posterior to this area now nearly contiguous/confluency with the dominant upper lobe nodule now measuring 14 mm (image 30, series 4) previously 7 mm, measurements from October of 2019 which were also similar on the prior PET. Ovoid RIGHT juxta hilar mass which is increased in size and is difficult to separate from surrounding consolidative post treatment changes (image 51, series 4) 31 x 27 mm, previously 25 x 20 mm. No new consolidation elsewhere in the chest. No sign of pleural  effusion. Apical scarring on the LEFT with similar appearance. Airways grossly patent aside from some narrowing of airways in the RIGHT hilum due to post treatment changes. Musculoskeletal: Subtle sclerotic focus in the RIGHT paramidline sternum (image 70, series 4) not changed since previous imaging, a very subtle finding without signs of FDG uptake on prior PET. Spinal degenerative changes. See below for full musculoskeletal details. CT ABDOMEN PELVIS FINDINGS Hepatobiliary: Subtle area of low attenuation in the RIGHT hepatic lobe (image 64, series 2) 6 mm not definitely seen on previous imaging though potentially present on prior PET and without increased FDG uptake is indeterminate no pericholecystic stranding. No biliary duct dilation. Portal vein is patent. Pancreas: Normal, without mass, inflammation or ductal dilatation. Spleen: Normal Adrenals/Urinary Tract: Adrenal glands are normal. Symmetric renal enhancement. Stable perinephric stranding, a chronic finding and not associated with hydronephrosis. Urinary bladder  decompressed limiting assessment. Grossly normal. Stomach/Bowel: No acute gastrointestinal process. Colonic diverticulosis Vascular/Lymphatic: Calcified and noncalcified atheromatous plaque of the abdominal aorta. There is no gastrohepatic or hepatoduodenal ligament lymphadenopathy. No retroperitoneal or mesenteric lymphadenopathy. Reproductive: Prostate unremarkable by CT. No pelvic sidewall lymphadenopathy. Other: Moderate bilateral fat containing inguinal hernias. No ascites. Musculoskeletal: No acute musculoskeletal finding or destructive bone process. Subtle very small sclerotic focus in the sternum less than a cm as described above. IMPRESSION: 1. Enlarging nodules and masses in the chest with enlarging lymph nodes as described. Findings of worsening disease in the chest as described. 2. Subtle area of low attenuation in the RIGHT hepatic lobe not definitely seen on previous imaging though  potentially present on prior PET and without increased FDG uptake is indeterminate. Attention on follow-up. 3. Subtle sclerotic focus in the RIGHT sternum, attention on follow-up. 4. No additional signs of disease in the chest, abdomen or pelvis. 5. Aortic atherosclerosis. 6. Moderate bilateral fat containing inguinal hernias. Aortic Atherosclerosis (ICD10-I70.0). Electronically Signed   By: Zetta Bills M.D.   On: 12/03/2020 14:33   CT Abdomen Pelvis W Contrast  Result Date: 12/03/2020 CLINICAL DATA:  Non-small cell lung cancer staging in this 60 year old male found to have non-small cell lung cancer post chemo radiotherapy and immunotherapy, ongoing systemic therapy by report. EXAM: CT CHEST, ABDOMEN, AND PELVIS WITH CONTRAST TECHNIQUE: Multidetector CT imaging of the chest, abdomen and pelvis was performed following the standard protocol during bolus administration of intravenous contrast. CONTRAST:  168mL OMNIPAQUE IOHEXOL 300 MG/ML  SOLN COMPARISON:  PET exam of September 16, 2020 FINDINGS: CT CHEST FINDINGS Cardiovascular: LEFT-sided Port-A-Cath terminates at the caval to atrial junction. Heart size is normal without sign of pericardial effusion. Aortic caliber is normal. Central pulmonary vasculature unremarkable on venous phase assessment. Mediastinum/Nodes: RIGHT subpectoral lymph node (image 6, series 2) 16 mm, previously 10 mm short axis RIGHT supraclavicular/lower neck lymph node (image 3, series 2) 16 mm previously approximately 14 mm short axis. RIGHT paratracheal lymph node (image 23, series 2) 13 mm short axis, previously 10 mm short axis. This is not a distinct lymph node but is stranding and soft tissue in the area of the RIGHT paratracheal chain. (Image 26, series 2) 12 mm subcarinal lymph node previously 7 mm. Consolidative changes about the RIGHT hilum contiguous with pulmonary lesions, see below. Lungs/Pleura: Enlarging RIGHT upper lobe pulmonary nodules, larger than in October of 2021.  (Image 33, series 4) 16 mm RIGHT upper lobe pulmonary nodule previously 13 mm. Smaller nodule seen posterior to this area now nearly contiguous/confluency with the dominant upper lobe nodule now measuring 14 mm (image 30, series 4) previously 7 mm, measurements from October of 2019 which were also similar on the prior PET. Ovoid RIGHT juxta hilar mass which is increased in size and is difficult to separate from surrounding consolidative post treatment changes (image 51, series 4) 31 x 27 mm, previously 25 x 20 mm. No new consolidation elsewhere in the chest. No sign of pleural effusion. Apical scarring on the LEFT with similar appearance. Airways grossly patent aside from some narrowing of airways in the RIGHT hilum due to post treatment changes. Musculoskeletal: Subtle sclerotic focus in the RIGHT paramidline sternum (image 70, series 4) not changed since previous imaging, a very subtle finding without signs of FDG uptake on prior PET. Spinal degenerative changes. See below for full musculoskeletal details. CT ABDOMEN PELVIS FINDINGS Hepatobiliary: Subtle area of low attenuation in the RIGHT hepatic lobe (image 64, series 2)  6 mm not definitely seen on previous imaging though potentially present on prior PET and without increased FDG uptake is indeterminate no pericholecystic stranding. No biliary duct dilation. Portal vein is patent. Pancreas: Normal, without mass, inflammation or ductal dilatation. Spleen: Normal Adrenals/Urinary Tract: Adrenal glands are normal. Symmetric renal enhancement. Stable perinephric stranding, a chronic finding and not associated with hydronephrosis. Urinary bladder decompressed limiting assessment. Grossly normal. Stomach/Bowel: No acute gastrointestinal process. Colonic diverticulosis Vascular/Lymphatic: Calcified and noncalcified atheromatous plaque of the abdominal aorta. There is no gastrohepatic or hepatoduodenal ligament lymphadenopathy. No retroperitoneal or mesenteric  lymphadenopathy. Reproductive: Prostate unremarkable by CT. No pelvic sidewall lymphadenopathy. Other: Moderate bilateral fat containing inguinal hernias. No ascites. Musculoskeletal: No acute musculoskeletal finding or destructive bone process. Subtle very small sclerotic focus in the sternum less than a cm as described above. IMPRESSION: 1. Enlarging nodules and masses in the chest with enlarging lymph nodes as described. Findings of worsening disease in the chest as described. 2. Subtle area of low attenuation in the RIGHT hepatic lobe not definitely seen on previous imaging though potentially present on prior PET and without increased FDG uptake is indeterminate. Attention on follow-up. 3. Subtle sclerotic focus in the RIGHT sternum, attention on follow-up. 4. No additional signs of disease in the chest, abdomen or pelvis. 5. Aortic atherosclerosis. 6. Moderate bilateral fat containing inguinal hernias. Aortic Atherosclerosis (ICD10-I70.0). Electronically Signed   By: Zetta Bills M.D.   On: 12/03/2020 14:33    ASSESSMENT AND PLAN: This is a 60 years old white male was recently diagnosed with stage IIIa non-small cell lung cancer, squamous cell carcinoma in April 2021 presented with right upper lobe/suprahilar lung mass in addition to right paratracheal and subcarinal lymphadenopathy. The patient completed a course of concurrent chemoradiation with weekly carboplatin for AUC of 2 and paclitaxel 45 MG/M2.  He is status post 7 cycles.  He tolerated the previous course of his treatment well except for mild dysphagia and odynophagia. He underwent consolidation treatment with immunotherapy with Imfinzi 1500 mg IV every 4 weeks.  Status post 3 cycles.  The patient tolerated the treatment well but unfortunately he has evidence for disease progression after cycle #3. He is started first-line treatment with chemotherapy with carboplatin for AUC of 5, paclitaxel 175 mg/M2 and Keytruda 200 mg IV every 3 weeks.   Status post 3 cycles.  He has been tolerating this treatment well with no concerning adverse effects except for mild fatigue. The patient had repeat CT scan of the chest, abdomen pelvis performed recently.  I personally and independently reviewed the scan images and discussed the result and showed the images to the patient today. Unfortunately his scan showed enlarging nodules and masses in the chest with enlarging lymph nodes.  This is definitely highly suspicious for disease progression but to the progression on immunotherapy could not be completely excluded. He is currently undergoing palliative systemic chemotherapy with second line docetaxel 75 Mg/M2 and Cyramza 10 mg/KG every 3 weeks with Neulasta support.  Status post 1 cycle.   He tolerated the first cycle of his treatment well except for the fatigue for few days after the Neulasta injection. I recommended for him to proceed with cycle #2 today as planned. For the hyperglycemia, we will give him 8 units of regular insulin and he will follow with his primary care physician as planned.  He is currently on Ozempic. For the insomnia, I will start the patient on Restoril 15 mg p.o. nightly as needed. For the thick mucus,  he was advised to use Mucinex as needed. The patient will come back for follow-up visit in 3 weeks for evaluation before the next cycle of his treatment. He was advised to call immediately if he has any concerning symptoms in the interval. The patient voices understanding of current disease status and treatment options and is in agreement with the current care plan. All questions were answered. The patient knows to call the clinic with any problems, questions or concerns. We can certainly see the patient much sooner if necessary.  Disclaimer: This note was dictated with voice recognition software. Similar sounding words can inadvertently be transcribed and may not be corrected upon review.

## 2021-01-01 NOTE — Telephone Encounter (Signed)
Scheduled appts per 2/17 los. Pt to get updated appt calendar at next visit per appt notes.

## 2021-01-02 ENCOUNTER — Inpatient Hospital Stay: Payer: BC Managed Care – PPO

## 2021-01-02 VITALS — BP 126/81 | HR 96 | Temp 97.6°F | Resp 20

## 2021-01-02 DIAGNOSIS — G4700 Insomnia, unspecified: Secondary | ICD-10-CM | POA: Diagnosis not present

## 2021-01-02 DIAGNOSIS — C3411 Malignant neoplasm of upper lobe, right bronchus or lung: Secondary | ICD-10-CM

## 2021-01-02 DIAGNOSIS — Z5111 Encounter for antineoplastic chemotherapy: Secondary | ICD-10-CM | POA: Diagnosis not present

## 2021-01-02 DIAGNOSIS — R5383 Other fatigue: Secondary | ICD-10-CM | POA: Diagnosis not present

## 2021-01-02 DIAGNOSIS — Z5112 Encounter for antineoplastic immunotherapy: Secondary | ICD-10-CM | POA: Diagnosis not present

## 2021-01-02 DIAGNOSIS — Z923 Personal history of irradiation: Secondary | ICD-10-CM | POA: Diagnosis not present

## 2021-01-02 DIAGNOSIS — Z79899 Other long term (current) drug therapy: Secondary | ICD-10-CM | POA: Diagnosis not present

## 2021-01-02 DIAGNOSIS — Z7982 Long term (current) use of aspirin: Secondary | ICD-10-CM | POA: Diagnosis not present

## 2021-01-02 DIAGNOSIS — R059 Cough, unspecified: Secondary | ICD-10-CM | POA: Diagnosis not present

## 2021-01-02 DIAGNOSIS — Z5189 Encounter for other specified aftercare: Secondary | ICD-10-CM | POA: Diagnosis not present

## 2021-01-02 MED ORDER — PEGFILGRASTIM-CBQV 6 MG/0.6ML ~~LOC~~ SOSY
6.0000 mg | PREFILLED_SYRINGE | Freq: Once | SUBCUTANEOUS | Status: AC
  Administered 2021-01-02: 6 mg via SUBCUTANEOUS

## 2021-01-03 ENCOUNTER — Inpatient Hospital Stay: Payer: BC Managed Care – PPO

## 2021-01-07 ENCOUNTER — Other Ambulatory Visit: Payer: BC Managed Care – PPO

## 2021-01-08 ENCOUNTER — Other Ambulatory Visit: Payer: Self-pay

## 2021-01-08 ENCOUNTER — Inpatient Hospital Stay: Payer: BC Managed Care – PPO

## 2021-01-08 ENCOUNTER — Other Ambulatory Visit: Payer: BC Managed Care – PPO

## 2021-01-08 DIAGNOSIS — R5383 Other fatigue: Secondary | ICD-10-CM | POA: Diagnosis not present

## 2021-01-08 DIAGNOSIS — Z923 Personal history of irradiation: Secondary | ICD-10-CM | POA: Diagnosis not present

## 2021-01-08 DIAGNOSIS — R059 Cough, unspecified: Secondary | ICD-10-CM | POA: Diagnosis not present

## 2021-01-08 DIAGNOSIS — Z7982 Long term (current) use of aspirin: Secondary | ICD-10-CM | POA: Diagnosis not present

## 2021-01-08 DIAGNOSIS — Z5189 Encounter for other specified aftercare: Secondary | ICD-10-CM | POA: Diagnosis not present

## 2021-01-08 DIAGNOSIS — Z79899 Other long term (current) drug therapy: Secondary | ICD-10-CM | POA: Diagnosis not present

## 2021-01-08 DIAGNOSIS — C3411 Malignant neoplasm of upper lobe, right bronchus or lung: Secondary | ICD-10-CM | POA: Diagnosis not present

## 2021-01-08 DIAGNOSIS — Z5111 Encounter for antineoplastic chemotherapy: Secondary | ICD-10-CM | POA: Diagnosis not present

## 2021-01-08 DIAGNOSIS — Z5112 Encounter for antineoplastic immunotherapy: Secondary | ICD-10-CM | POA: Diagnosis not present

## 2021-01-08 DIAGNOSIS — G4700 Insomnia, unspecified: Secondary | ICD-10-CM | POA: Diagnosis not present

## 2021-01-08 LAB — CMP (CANCER CENTER ONLY)
ALT: 17 U/L (ref 0–44)
AST: 14 U/L — ABNORMAL LOW (ref 15–41)
Albumin: 4 g/dL (ref 3.5–5.0)
Alkaline Phosphatase: 179 U/L — ABNORMAL HIGH (ref 38–126)
Anion gap: 9 (ref 5–15)
BUN: 16 mg/dL (ref 6–20)
CO2: 24 mmol/L (ref 22–32)
Calcium: 10.1 mg/dL (ref 8.9–10.3)
Chloride: 101 mmol/L (ref 98–111)
Creatinine: 1.14 mg/dL (ref 0.61–1.24)
GFR, Estimated: >60 mL/min (ref >60)
Glucose, Bld: 284 mg/dL — ABNORMAL HIGH (ref 70–99)
Potassium: 4.4 mmol/L (ref 3.5–5.1)
Sodium: 134 mmol/L — ABNORMAL LOW (ref 135–145)
Total Bilirubin: 0.5 mg/dL (ref 0.3–1.2)
Total Protein: 7 g/dL (ref 6.5–8.1)

## 2021-01-08 LAB — CBC WITH DIFFERENTIAL (CANCER CENTER ONLY)
Abs Immature Granulocytes: 0.15 10*3/uL — ABNORMAL HIGH (ref 0.00–0.07)
Basophils Absolute: 0.1 10*3/uL (ref 0.0–0.1)
Basophils Relative: 1 %
Eosinophils Absolute: 0.1 10*3/uL (ref 0.0–0.5)
Eosinophils Relative: 1 %
HCT: 33.9 % — ABNORMAL LOW (ref 39.0–52.0)
Hemoglobin: 11.4 g/dL — ABNORMAL LOW (ref 13.0–17.0)
Immature Granulocytes: 1 %
Lymphocytes Relative: 12 %
Lymphs Abs: 1.4 10*3/uL (ref 0.7–4.0)
MCH: 29.5 pg (ref 26.0–34.0)
MCHC: 33.6 g/dL (ref 30.0–36.0)
MCV: 87.8 fL (ref 80.0–100.0)
Monocytes Absolute: 1.6 10*3/uL — ABNORMAL HIGH (ref 0.1–1.0)
Monocytes Relative: 14 %
Neutro Abs: 7.8 10*3/uL — ABNORMAL HIGH (ref 1.7–7.7)
Neutrophils Relative %: 71 %
Platelet Count: 180 10*3/uL (ref 150–400)
RBC: 3.86 MIL/uL — ABNORMAL LOW (ref 4.22–5.81)
RDW: 17.5 % — ABNORMAL HIGH (ref 11.5–15.5)
WBC Count: 11.2 10*3/uL — ABNORMAL HIGH (ref 4.0–10.5)
nRBC: 0.4 % — ABNORMAL HIGH (ref 0.0–0.2)

## 2021-01-08 LAB — TSH: TSH: 9.272 u[IU]/mL — ABNORMAL HIGH (ref 0.320–4.118)

## 2021-01-08 MED ORDER — OZEMPIC (0.25 OR 0.5 MG/DOSE) 2 MG/1.5ML ~~LOC~~ SOPN: 0.5000 mg | PEN_INJECTOR | SUBCUTANEOUS | 1 refills | Status: DC

## 2021-01-13 ENCOUNTER — Other Ambulatory Visit: Payer: Self-pay | Admitting: Physician Assistant

## 2021-01-13 DIAGNOSIS — E039 Hypothyroidism, unspecified: Secondary | ICD-10-CM

## 2021-01-13 MED ORDER — LEVOTHYROXINE SODIUM 50 MCG PO TABS: 50.0000 ug | ORAL_TABLET | Freq: Every day | ORAL | 1 refills | Status: DC

## 2021-01-13 NOTE — Progress Notes (Signed)
I called the patient to let him know I was starting him on 50 mcg of synthroid. He expressed understanding. We will continue to monitor his TSH.

## 2021-01-15 ENCOUNTER — Other Ambulatory Visit: Payer: Self-pay

## 2021-01-15 ENCOUNTER — Other Ambulatory Visit: Payer: BC Managed Care – PPO

## 2021-01-15 ENCOUNTER — Inpatient Hospital Stay: Payer: BC Managed Care – PPO | Attending: Internal Medicine

## 2021-01-15 ENCOUNTER — Ambulatory Visit: Payer: BC Managed Care – PPO | Admitting: Internal Medicine

## 2021-01-15 ENCOUNTER — Ambulatory Visit: Payer: BC Managed Care – PPO

## 2021-01-15 DIAGNOSIS — Z7982 Long term (current) use of aspirin: Secondary | ICD-10-CM | POA: Diagnosis not present

## 2021-01-15 DIAGNOSIS — R531 Weakness: Secondary | ICD-10-CM | POA: Diagnosis not present

## 2021-01-15 DIAGNOSIS — Z5189 Encounter for other specified aftercare: Secondary | ICD-10-CM | POA: Diagnosis not present

## 2021-01-15 DIAGNOSIS — R634 Abnormal weight loss: Secondary | ICD-10-CM | POA: Diagnosis not present

## 2021-01-15 DIAGNOSIS — K219 Gastro-esophageal reflux disease without esophagitis: Secondary | ICD-10-CM | POA: Diagnosis not present

## 2021-01-15 DIAGNOSIS — Z79899 Other long term (current) drug therapy: Secondary | ICD-10-CM | POA: Diagnosis not present

## 2021-01-15 DIAGNOSIS — R5383 Other fatigue: Secondary | ICD-10-CM | POA: Insufficient documentation

## 2021-01-15 DIAGNOSIS — G479 Sleep disorder, unspecified: Secondary | ICD-10-CM | POA: Diagnosis not present

## 2021-01-15 DIAGNOSIS — E119 Type 2 diabetes mellitus without complications: Secondary | ICD-10-CM | POA: Diagnosis not present

## 2021-01-15 DIAGNOSIS — Z7984 Long term (current) use of oral hypoglycemic drugs: Secondary | ICD-10-CM | POA: Diagnosis not present

## 2021-01-15 DIAGNOSIS — C3411 Malignant neoplasm of upper lobe, right bronchus or lung: Secondary | ICD-10-CM | POA: Insufficient documentation

## 2021-01-15 DIAGNOSIS — Z5111 Encounter for antineoplastic chemotherapy: Secondary | ICD-10-CM | POA: Diagnosis not present

## 2021-01-15 DIAGNOSIS — E7800 Pure hypercholesterolemia, unspecified: Secondary | ICD-10-CM | POA: Insufficient documentation

## 2021-01-15 DIAGNOSIS — R110 Nausea: Secondary | ICD-10-CM | POA: Diagnosis not present

## 2021-01-15 DIAGNOSIS — Z5112 Encounter for antineoplastic immunotherapy: Secondary | ICD-10-CM | POA: Diagnosis not present

## 2021-01-15 LAB — CBC WITH DIFFERENTIAL (CANCER CENTER ONLY)
Abs Immature Granulocytes: 0.12 10*3/uL — ABNORMAL HIGH (ref 0.00–0.07)
Basophils Absolute: 0.1 10*3/uL (ref 0.0–0.1)
Basophils Relative: 1 %
Eosinophils Absolute: 0.2 10*3/uL (ref 0.0–0.5)
Eosinophils Relative: 1 %
HCT: 33.2 % — ABNORMAL LOW (ref 39.0–52.0)
Hemoglobin: 11 g/dL — ABNORMAL LOW (ref 13.0–17.0)
Immature Granulocytes: 1 %
Lymphocytes Relative: 10 %
Lymphs Abs: 1.4 10*3/uL (ref 0.7–4.0)
MCH: 30 pg (ref 26.0–34.0)
MCHC: 33.1 g/dL (ref 30.0–36.0)
MCV: 90.5 fL (ref 80.0–100.0)
Monocytes Absolute: 1 10*3/uL (ref 0.1–1.0)
Monocytes Relative: 7 %
Neutro Abs: 11.1 10*3/uL — ABNORMAL HIGH (ref 1.7–7.7)
Neutrophils Relative %: 80 %
Platelet Count: 158 10*3/uL (ref 150–400)
RBC: 3.67 MIL/uL — ABNORMAL LOW (ref 4.22–5.81)
RDW: 17.9 % — ABNORMAL HIGH (ref 11.5–15.5)
WBC Count: 13.9 10*3/uL — ABNORMAL HIGH (ref 4.0–10.5)
nRBC: 0.1 % (ref 0.0–0.2)

## 2021-01-15 LAB — CMP (CANCER CENTER ONLY)
ALT: 17 U/L (ref 0–44)
AST: 15 U/L (ref 15–41)
Albumin: 4.1 g/dL (ref 3.5–5.0)
Alkaline Phosphatase: 146 U/L — ABNORMAL HIGH (ref 38–126)
Anion gap: 9 (ref 5–15)
BUN: 10 mg/dL (ref 6–20)
CO2: 24 mmol/L (ref 22–32)
Calcium: 9.7 mg/dL (ref 8.9–10.3)
Chloride: 104 mmol/L (ref 98–111)
Creatinine: 1.1 mg/dL (ref 0.61–1.24)
GFR, Estimated: >60 mL/min (ref >60)
Glucose, Bld: 176 mg/dL — ABNORMAL HIGH (ref 70–99)
Potassium: 4.3 mmol/L (ref 3.5–5.1)
Sodium: 137 mmol/L (ref 135–145)
Total Bilirubin: 0.4 mg/dL (ref 0.3–1.2)
Total Protein: 6.9 g/dL (ref 6.5–8.1)

## 2021-01-15 LAB — TOTAL PROTEIN, URINE DIPSTICK: Protein, ur: 100 mg/dL — AB

## 2021-01-20 ENCOUNTER — Other Ambulatory Visit: Payer: Self-pay | Admitting: Physician Assistant

## 2021-01-20 DIAGNOSIS — Z5111 Encounter for antineoplastic chemotherapy: Secondary | ICD-10-CM

## 2021-01-22 ENCOUNTER — Inpatient Hospital Stay: Payer: BC Managed Care – PPO

## 2021-01-22 ENCOUNTER — Encounter: Payer: Self-pay | Admitting: Internal Medicine

## 2021-01-22 ENCOUNTER — Other Ambulatory Visit: Payer: Self-pay

## 2021-01-22 ENCOUNTER — Telehealth: Payer: Self-pay

## 2021-01-22 ENCOUNTER — Inpatient Hospital Stay (HOSPITAL_BASED_OUTPATIENT_CLINIC_OR_DEPARTMENT_OTHER): Payer: BC Managed Care – PPO | Admitting: Internal Medicine

## 2021-01-22 VITALS — HR 100

## 2021-01-22 VITALS — BP 132/83 | HR 109 | Temp 97.2°F | Resp 13 | Ht 72.0 in | Wt 242.9 lb

## 2021-01-22 DIAGNOSIS — C3490 Malignant neoplasm of unspecified part of unspecified bronchus or lung: Secondary | ICD-10-CM

## 2021-01-22 DIAGNOSIS — Z5189 Encounter for other specified aftercare: Secondary | ICD-10-CM | POA: Diagnosis not present

## 2021-01-22 DIAGNOSIS — C3411 Malignant neoplasm of upper lobe, right bronchus or lung: Secondary | ICD-10-CM

## 2021-01-22 DIAGNOSIS — G479 Sleep disorder, unspecified: Secondary | ICD-10-CM | POA: Diagnosis not present

## 2021-01-22 DIAGNOSIS — Z7984 Long term (current) use of oral hypoglycemic drugs: Secondary | ICD-10-CM | POA: Diagnosis not present

## 2021-01-22 DIAGNOSIS — E7800 Pure hypercholesterolemia, unspecified: Secondary | ICD-10-CM | POA: Diagnosis not present

## 2021-01-22 DIAGNOSIS — Z79899 Other long term (current) drug therapy: Secondary | ICD-10-CM | POA: Diagnosis not present

## 2021-01-22 DIAGNOSIS — R5383 Other fatigue: Secondary | ICD-10-CM | POA: Diagnosis not present

## 2021-01-22 DIAGNOSIS — Z7982 Long term (current) use of aspirin: Secondary | ICD-10-CM | POA: Diagnosis not present

## 2021-01-22 DIAGNOSIS — Z5111 Encounter for antineoplastic chemotherapy: Secondary | ICD-10-CM | POA: Diagnosis not present

## 2021-01-22 DIAGNOSIS — R634 Abnormal weight loss: Secondary | ICD-10-CM | POA: Diagnosis not present

## 2021-01-22 DIAGNOSIS — E119 Type 2 diabetes mellitus without complications: Secondary | ICD-10-CM | POA: Diagnosis not present

## 2021-01-22 DIAGNOSIS — R531 Weakness: Secondary | ICD-10-CM | POA: Diagnosis not present

## 2021-01-22 DIAGNOSIS — Z5112 Encounter for antineoplastic immunotherapy: Secondary | ICD-10-CM | POA: Diagnosis not present

## 2021-01-22 DIAGNOSIS — K219 Gastro-esophageal reflux disease without esophagitis: Secondary | ICD-10-CM | POA: Diagnosis not present

## 2021-01-22 DIAGNOSIS — Z95828 Presence of other vascular implants and grafts: Secondary | ICD-10-CM

## 2021-01-22 DIAGNOSIS — R110 Nausea: Secondary | ICD-10-CM | POA: Diagnosis not present

## 2021-01-22 LAB — CMP (CANCER CENTER ONLY)
ALT: 14 U/L (ref 0–44)
AST: 11 U/L — ABNORMAL LOW (ref 15–41)
Albumin: 3.6 g/dL (ref 3.5–5.0)
Alkaline Phosphatase: 131 U/L — ABNORMAL HIGH (ref 38–126)
Anion gap: 10 (ref 5–15)
BUN: 14 mg/dL (ref 6–20)
CO2: 20 mmol/L — ABNORMAL LOW (ref 22–32)
Calcium: 9.4 mg/dL (ref 8.9–10.3)
Chloride: 106 mmol/L (ref 98–111)
Creatinine: 1.19 mg/dL (ref 0.61–1.24)
GFR, Estimated: >60 mL/min (ref >60)
Glucose, Bld: 248 mg/dL — ABNORMAL HIGH (ref 70–99)
Potassium: 4.2 mmol/L (ref 3.5–5.1)
Sodium: 136 mmol/L (ref 135–145)
Total Bilirubin: 0.4 mg/dL (ref 0.3–1.2)
Total Protein: 7.5 g/dL (ref 6.5–8.1)

## 2021-01-22 LAB — CBC WITH DIFFERENTIAL (CANCER CENTER ONLY)
Abs Immature Granulocytes: 0.12 10*3/uL — ABNORMAL HIGH (ref 0.00–0.07)
Basophils Absolute: 0.1 10*3/uL (ref 0.0–0.1)
Basophils Relative: 0 %
Eosinophils Absolute: 0 10*3/uL (ref 0.0–0.5)
Eosinophils Relative: 0 %
HCT: 31 % — ABNORMAL LOW (ref 39.0–52.0)
Hemoglobin: 10.3 g/dL — ABNORMAL LOW (ref 13.0–17.0)
Immature Granulocytes: 1 %
Lymphocytes Relative: 5 %
Lymphs Abs: 1 10*3/uL (ref 0.7–4.0)
MCH: 30.1 pg (ref 26.0–34.0)
MCHC: 33.2 g/dL (ref 30.0–36.0)
MCV: 90.6 fL (ref 80.0–100.0)
Monocytes Absolute: 1.4 10*3/uL — ABNORMAL HIGH (ref 0.1–1.0)
Monocytes Relative: 7 %
Neutro Abs: 17.8 10*3/uL — ABNORMAL HIGH (ref 1.7–7.7)
Neutrophils Relative %: 87 %
Platelet Count: 231 10*3/uL (ref 150–400)
RBC: 3.42 MIL/uL — ABNORMAL LOW (ref 4.22–5.81)
RDW: 17.4 % — ABNORMAL HIGH (ref 11.5–15.5)
WBC Count: 20.3 10*3/uL — ABNORMAL HIGH (ref 4.0–10.5)
nRBC: 0 % (ref 0.0–0.2)

## 2021-01-22 MED ORDER — SODIUM CHLORIDE 0.9 % IV SOLN
10.0000 mg | Freq: Once | INTRAVENOUS | Status: AC
  Administered 2021-01-22: 10 mg via INTRAVENOUS
  Filled 2021-01-22: qty 10

## 2021-01-22 MED ORDER — SODIUM CHLORIDE 0.9% FLUSH
10.0000 mL | INTRAVENOUS | Status: DC | PRN
  Administered 2021-01-22: 10 mL
  Filled 2021-01-22: qty 10

## 2021-01-22 MED ORDER — HEPARIN SOD (PORK) LOCK FLUSH 100 UNIT/ML IV SOLN
500.0000 [IU] | Freq: Once | INTRAVENOUS | Status: AC | PRN
  Administered 2021-01-22: 500 [IU]
  Filled 2021-01-22: qty 5

## 2021-01-22 MED ORDER — SODIUM CHLORIDE 0.9 % IV SOLN
75.0000 mg/m2 | Freq: Once | INTRAVENOUS | Status: AC
  Administered 2021-01-22: 180 mg via INTRAVENOUS
  Filled 2021-01-22: qty 18

## 2021-01-22 MED ORDER — ACETAMINOPHEN 325 MG PO TABS
650.0000 mg | ORAL_TABLET | Freq: Once | ORAL | Status: AC
  Administered 2021-01-22: 650 mg via ORAL

## 2021-01-22 MED ORDER — DIPHENHYDRAMINE HCL 50 MG/ML IJ SOLN
INTRAMUSCULAR | Status: AC
  Filled 2021-01-22: qty 1

## 2021-01-22 MED ORDER — SODIUM CHLORIDE 0.9% FLUSH
10.0000 mL | Freq: Once | INTRAVENOUS | Status: AC
  Administered 2021-01-22: 10 mL
  Filled 2021-01-22: qty 10

## 2021-01-22 MED ORDER — DIPHENHYDRAMINE HCL 50 MG/ML IJ SOLN
50.0000 mg | Freq: Once | INTRAMUSCULAR | Status: AC
  Administered 2021-01-22: 50 mg via INTRAVENOUS

## 2021-01-22 MED ORDER — ACETAMINOPHEN 325 MG PO TABS
ORAL_TABLET | ORAL | Status: AC
  Filled 2021-01-22: qty 2

## 2021-01-22 MED ORDER — SODIUM CHLORIDE 0.9 % IV SOLN
Freq: Once | INTRAVENOUS | Status: AC
  Filled 2021-01-22: qty 250

## 2021-01-22 MED ORDER — SODIUM CHLORIDE 0.9 % IV SOLN
10.0000 mg/kg | Freq: Once | INTRAVENOUS | Status: AC
  Administered 2021-01-22: 1100 mg via INTRAVENOUS
  Filled 2021-01-22: qty 100

## 2021-01-22 NOTE — Telephone Encounter (Signed)
Called pt per 01/22/21 message and he is aware to come in at 3 vs 4.....William Farrell

## 2021-01-22 NOTE — Patient Instructions (Signed)

## 2021-01-22 NOTE — Patient Instructions (Signed)
[facility] Discharge Instructions for Patients Receiving Chemotherapy  Today you received the following chemotherapy agents: Cyramza and Docetaxel.  To help prevent nausea and vomiting after your treatment, we encourage you to take your nausea medication as directed by your MD.   If you develop nausea and vomiting that is not controlled by your nausea medication, call the clinic.   BELOW ARE SYMPTOMS THAT SHOULD BE REPORTED IMMEDIATELY:  *FEVER GREATER THAN 100.5 F  *CHILLS WITH OR WITHOUT FEVER  NAUSEA AND VOMITING THAT IS NOT CONTROLLED WITH YOUR NAUSEA MEDICATION  *UNUSUAL SHORTNESS OF BREATH  *UNUSUAL BRUISING OR BLEEDING  TENDERNESS IN MOUTH AND THROAT WITH OR WITHOUT PRESENCE OF ULCERS  *URINARY PROBLEMS  *BOWEL PROBLEMS  UNUSUAL RASH Items with * indicate a potential emergency and should be followed up as soon as possible.  Feel free to call the clinic should you have any questions or concerns. The clinic phone number is [phone].  Please show the [facility] at check-in to the Emergency Department and triage nurse.

## 2021-01-22 NOTE — Progress Notes (Signed)
Per Dr. Julien Nordmann, okay for patient to receive treatment today with urine protein 100 from 01/15/21.

## 2021-01-22 NOTE — Progress Notes (Signed)
[facility] [phone]   [phone]  OFFICE PROGRESS NOTE  [name], DO [address] [place] [address]  DIAGNOSIS: Recurrent/progressive non-small cell lung cancer initially diagnosed as stage IIIA(T2a, N2, M0) non-small cell lung cancer, squamous cell carcinoma diagnosed in April 2021, presented with right upper lobe/suprahilar lung mass in addition to right paratracheal and subcarinal lymphadenopathy.  Molecular Studies by [employer]: No actionable mutations  PRIOR THERAPY:  1) Concurrent chemoradiation with weekly carboplatin for AUC of 2 and paclitaxel 45 NG/M2. First dose on Mar 31, 2020. Status post 7 cycles. Last dose was given on 05/12/2020 with partial response. 2) Consolidation immunotherapy with durvalumab 1500 mg IV every 4 weeks. First dose May 12, 2020.Status post 3 cycles. 3) Systemic chemotherapy with carboplatin for an AUC of 5, paclitaxel 175 mg/m2, and Keytruda 200 mg IV every 3 weeks. First dose expected on 10/02/20.   Status post 3 cycles.  Last dose was given on November 13, 2020.  CURRENT THERAPY:  Second line systemic chemotherapy with docetaxel 75 Mg/M2 and Cyramza 10 mg/KG every 3 weeks with Neulasta support.  First dose December 11, 2020.  Status post 2 cycles.   INTERVAL HISTORY: William Farrell 60 y.o. male returns to the clinic today for follow-up visit accompanied by his wife.  The patient continues to have a lot of fatigue and weakness as well as intermittent nausea after cough.  He denied having any current chest pain but has shortness of breath with exertion with no hemoptysis.  He denied having any fever or chills.  He has no vomiting, diarrhea, abdominal pain or constipation.  He has no headache or visual changes.  He tolerated the last cycle of his treatment fairly well.  He was able to handle the Neulasta injection much better taking his diabetic medicine as well as sleeping  medicine.  The patient is here today for evaluation before starting cycle #3.  MEDICAL HISTORY: Past Medical History:  Diagnosis Date  . Asthma    as a child  . Diabetes ([place])   . Diverticulitis   . GERD (gastroesophageal reflux disease)   . High cholesterol   . History of kidney stones     ALLERGIES:  is allergic to sitagliptin.  MEDICATIONS:  Current Outpatient Medications  Medication Sig Dispense Refill  . acetaminophen (TYLENOL) 500 MG tablet Take 1,000 mg by mouth every 6 (six) hours as needed for mild pain.    Marland Kitchen albuterol (VENTOLIN HFA) 108 (90 Base) MCG/ACT inhaler Inhale 1-2 puffs into the lungs every 4 (four) hours as needed for wheezing or shortness of breath. 18 g 99  . aspirin EC 81 MG tablet Take 81 mg by mouth at bedtime.     Marland Kitchen atorvastatin (LIPITOR) 40 MG tablet TAKE 1 TABLET DAILY (Patient taking differently: Take 40 mg by mouth at bedtime.) 90 tablet 1  . calcium carbonate (TUMS - DOSED IN MG ELEMENTAL CALCIUM) 500 MG chewable tablet Chew 2 tablets by mouth 3 (three) times daily as needed for indigestion or heartburn.     . dexamethasone (DECADRON) 4 MG tablet 1 tablet p.o. twice daily the day before, day of and day after chemotherapy every 3 weeks. 40 tablet 0  . docusate sodium (COLACE) 100 MG capsule Take 100 mg by mouth daily.    Marland Kitchen glipiZIDE (GLUCOTROL) 5 MG tablet Take 1 tablet (5 mg total) by mouth 2 (two) times daily before a meal. 180 tablet 3  .  levothyroxine (SYNTHROID) 50 MCG tablet Take 1 tablet (50 mcg total) by mouth daily before breakfast. 30 tablet 1  . lidocaine-prilocaine (EMLA) cream Apply 1 application topically as needed. 30 g 2  . meloxicam (MOBIC) 15 MG tablet TAKE 1 TABLET DAILY (Patient taking differently: Take 15 mg by mouth daily.) 90 tablet 3  . metFORMIN (GLUCOPHAGE) 1000 MG tablet TAKE 1 TABLET TWICE A DAY WITH MEALS (Patient taking differently: Take 500 mg by mouth 2 (two) times daily with a meal.) 180 tablet 3  . OneTouch Delica Lancets  95M MISC As directed up to qid 100 each 99  . ONETOUCH ULTRA test strip USE AS INSTRUCTED UP TO FOUR TIMES A DAY 100 strip 13  . oxyCODONE-acetaminophen (PERCOCET/ROXICET) 5-325 MG tablet Take 1 tablet by mouth every 8 (eight) hours as needed for severe pain. 30 tablet 0  . prochlorperazine (COMPAZINE) 10 MG tablet TAKE 1 TABLET BY MOUTH EVERY 6 HOURS AS NEEDED FOR NAUSEA OR VOMITING. 30 tablet 2  . Semaglutide,0.25 or 0.5MG /DOS, (OZEMPIC, 0.25 OR 0.5 MG/DOSE,) 2 MG/1.5ML SOPN Inject 0.5 mg into the skin once a week. 7.5 mL 1  . temazepam (RESTORIL) 15 MG capsule Take 1 capsule (15 mg total) by mouth at bedtime as needed for sleep. 30 capsule 0  . trolamine salicylate (ASPERCREME) 10 % cream Apply 1 application topically 2 (two) times daily as needed for muscle pain.      No current facility-administered medications for this visit.    SURGICAL HISTORY:  Past Surgical History:  Procedure Laterality Date  . broken bone repair    . BRONCHIAL BIOPSY  03/11/2020   Procedure: BRONCHIAL BIOPSIES;  Surgeon: Garner Nash, DO;  Location: [facility];  Service: Pulmonary;;  . COLONOSCOPY    . CRYOTHERAPY  03/11/2020   Procedure: CRYOTHERAPY;  Surgeon: Garner Nash, DO;  Location: [facility];  Service: Pulmonary;;  . FINE NEEDLE ASPIRATION  03/11/2020   Procedure: FINE NEEDLE ASPIRATION (FNA) LINEAR;  Surgeon: Garner Nash, DO;  Location: [facility];  Service: Pulmonary;;  . IR IMAGING GUIDED PORT INSERTION  10/16/2020  . KNEE ARTHROSCOPY WITH ANTERIOR CRUCIATE LIGAMENT (ACL) REPAIR     x 2  . VIDEO BRONCHOSCOPY WITH ENDOBRONCHIAL ULTRASOUND N/A 03/11/2020   Procedure: VIDEO BRONCHOSCOPY WITH ENDOBRONCHIAL ULTRASOUND;  Surgeon: Garner Nash, DO;  Location: [facility];  Service: Pulmonary;  Laterality: N/A;    REVIEW OF SYSTEMS:  Constitutional: positive for fatigue and weight loss Eyes: negative Ears, nose, mouth, throat, and face: negative Respiratory: positive for  cough Cardiovascular: negative Gastrointestinal: positive for nausea Genitourinary:negative Integument/breast: negative Hematologic/lymphatic: negative Musculoskeletal:negative Neurological: negative Behavioral/Psych: positive for sleep disturbance Endocrine: negative Allergic/Immunologic: negative   PHYSICAL EXAMINATION: General appearance: alert, cooperative, fatigued and no distress Head: Normocephalic, without obvious abnormality, atraumatic Neck: no adenopathy, no JVD, supple, symmetrical, trachea midline and thyroid not enlarged, symmetric, no tenderness/mass/nodules Lymph nodes: Cervical, supraclavicular, and axillary nodes normal. Resp: clear to auscultation bilaterally Back: symmetric, no curvature. ROM normal. No CVA tenderness. Cardio: regular rate and rhythm, S1, S2 normal, no murmur, click, rub or gallop GI: soft, non-tender; bowel sounds normal; no masses,  no organomegaly Extremities: extremities normal, atraumatic, no cyanosis or edema Neurologic: Alert and oriented X 3, normal strength and tone. Normal symmetric reflexes. Normal coordination and gait  ECOG PERFORMANCE STATUS: 1 - Symptomatic but completely ambulatory  Blood pressure 132/83, pulse (!) 109, temperature (!) 97.2 F (36.2 C), temperature source Tympanic, resp. rate 13, height 6' (1.829 m),  weight 242 lb 14.4 oz (110.2 kg), SpO2 100 %.  LABORATORY DATA: Lab Results  Component Value Date   WBC 20.3 (H) 01/22/2021   HGB 10.3 (L) 01/22/2021   HCT 31.0 (L) 01/22/2021   MCV 90.6 01/22/2021   PLT 231 01/22/2021      Chemistry      Component Value Date/Time   NA 136 01/22/2021 0916   K 4.2 01/22/2021 0916   CL 106 01/22/2021 0916   CO2 20 (L) 01/22/2021 0916   BUN 14 01/22/2021 0916   CREATININE 1.19 01/22/2021 0916   CREATININE 1.10 01/14/2020 0859      Component Value Date/Time   CALCIUM 9.4 01/22/2021 0916   ALKPHOS 131 (H) 01/22/2021 0916   AST 11 (L) 01/22/2021 0916   ALT 14 01/22/2021  0916   BILITOT 0.4 01/22/2021 0916       RADIOGRAPHIC STUDIES: No results found.  ASSESSMENT AND PLAN: This is a 60 years old white male was recently diagnosed with stage IIIa non-small cell lung cancer, squamous cell carcinoma in April 2021 presented with right upper lobe/suprahilar lung mass in addition to right paratracheal and subcarinal lymphadenopathy. The patient completed a course of concurrent chemoradiation with weekly carboplatin for AUC of 2 and paclitaxel 45 MG/M2.  He is status post 7 cycles.  He tolerated the previous course of his treatment well except for mild dysphagia and odynophagia. He underwent consolidation treatment with immunotherapy with Imfinzi 1500 mg IV every 4 weeks.  Status post 3 cycles.  The patient tolerated the treatment well but unfortunately he has evidence for disease progression after cycle #3. He is started first-line treatment with chemotherapy with carboplatin for AUC of 5, paclitaxel 175 mg/M2 and Keytruda 200 mg IV every 3 weeks.  Status post 3 cycles.  He has been tolerating this treatment well with no concerning adverse effects except for mild fatigue. The patient had repeat CT scan of the chest, abdomen pelvis performed recently.  I personally and independently reviewed the scan images and discussed the result and showed the images to the patient today. Unfortunately his scan showed enlarging nodules and masses in the chest with enlarging lymph nodes.  This is definitely highly suspicious for disease progression but to the progression on immunotherapy could not be completely excluded. He is currently undergoing palliative systemic chemotherapy with second line docetaxel 75 Mg/M2 and Cyramza 10 mg/KG every 3 weeks with Neulasta support.  Status post 2 cycles.   The patient is tolerating his treatment much better now with no concerning complaints except for the fatigue. I recommended for him to proceed with cycle #3 today as planned. He will come back  for follow-up visit in 3 weeks for evaluation with repeat CT scan of the chest, abdomen pelvis for restaging of his disease. For the diabetes mellitus he is currently on Ozempic and he will follow-up with his primary care physician for regulation of his medication. For the insomnia, I will start the patient on Restoril 15 mg p.o. nightly as needed. The patient and his wife had several questions about his current condition and the role of surgery and explained to the patient that he is not a surgical candidate for resection. He was advised to call immediately if he has any other concerning symptoms in the interval. The patient voices understanding of current disease status and treatment options and is in agreement with the current care plan. All questions were answered. The patient knows to call the clinic with any problems, questions  or concerns. We can certainly see the patient much sooner if necessary.  Disclaimer: This note was dictated with voice recognition software. Similar sounding words can inadvertently be transcribed and may not be corrected upon review.

## 2021-01-23 ENCOUNTER — Inpatient Hospital Stay: Payer: BC Managed Care – PPO

## 2021-01-23 VITALS — BP 121/79 | HR 105 | Temp 98.0°F | Resp 18

## 2021-01-23 DIAGNOSIS — E119 Type 2 diabetes mellitus without complications: Secondary | ICD-10-CM | POA: Diagnosis not present

## 2021-01-23 DIAGNOSIS — E7800 Pure hypercholesterolemia, unspecified: Secondary | ICD-10-CM | POA: Diagnosis not present

## 2021-01-23 DIAGNOSIS — C3411 Malignant neoplasm of upper lobe, right bronchus or lung: Secondary | ICD-10-CM | POA: Diagnosis not present

## 2021-01-23 DIAGNOSIS — R634 Abnormal weight loss: Secondary | ICD-10-CM | POA: Diagnosis not present

## 2021-01-23 DIAGNOSIS — G479 Sleep disorder, unspecified: Secondary | ICD-10-CM | POA: Diagnosis not present

## 2021-01-23 DIAGNOSIS — R110 Nausea: Secondary | ICD-10-CM | POA: Diagnosis not present

## 2021-01-23 DIAGNOSIS — Z5112 Encounter for antineoplastic immunotherapy: Secondary | ICD-10-CM | POA: Diagnosis not present

## 2021-01-23 DIAGNOSIS — R5383 Other fatigue: Secondary | ICD-10-CM | POA: Diagnosis not present

## 2021-01-23 DIAGNOSIS — Z5111 Encounter for antineoplastic chemotherapy: Secondary | ICD-10-CM | POA: Diagnosis not present

## 2021-01-23 DIAGNOSIS — Z7982 Long term (current) use of aspirin: Secondary | ICD-10-CM | POA: Diagnosis not present

## 2021-01-23 DIAGNOSIS — Z79899 Other long term (current) drug therapy: Secondary | ICD-10-CM | POA: Diagnosis not present

## 2021-01-23 DIAGNOSIS — Z5189 Encounter for other specified aftercare: Secondary | ICD-10-CM | POA: Diagnosis not present

## 2021-01-23 DIAGNOSIS — K219 Gastro-esophageal reflux disease without esophagitis: Secondary | ICD-10-CM | POA: Diagnosis not present

## 2021-01-23 DIAGNOSIS — R531 Weakness: Secondary | ICD-10-CM | POA: Diagnosis not present

## 2021-01-23 DIAGNOSIS — Z7984 Long term (current) use of oral hypoglycemic drugs: Secondary | ICD-10-CM | POA: Diagnosis not present

## 2021-01-23 MED ORDER — PEGFILGRASTIM-CBQV 6 MG/0.6ML ~~LOC~~ SOSY
6.0000 mg | PREFILLED_SYRINGE | Freq: Once | SUBCUTANEOUS | Status: AC
  Administered 2021-01-23: 6 mg via SUBCUTANEOUS

## 2021-01-23 NOTE — Patient Instructions (Signed)
Pegfilgrastim injection What is this medicine? PEGFILGRASTIM (PEG fil gra stim) is a long-acting granulocyte colony-stimulating factor that stimulates the growth of neutrophils, a type of white blood cell important in the body's fight against infection. It is used to reduce the incidence of fever and infection in patients with certain types of cancer who are receiving chemotherapy that affects the bone marrow, and to increase survival after being exposed to high doses of radiation. This medicine may be used for other purposes; ask your health care provider or pharmacist if you have questions. COMMON BRAND NAME(S): Fulphila, Neulasta, Nyvepria, UDENYCA, Ziextenzo What should I tell my health care provider before I take this medicine? They need to know if you have any of these conditions:  kidney disease  latex allergy  ongoing radiation therapy  sickle cell disease  skin reactions to acrylic adhesives (On-Body Injector only)  an unusual or allergic reaction to pegfilgrastim, filgrastim, other medicines, foods, dyes, or preservatives  pregnant or trying to get pregnant  breast-feeding How should I use this medicine? This medicine is for injection under the skin. If you get this medicine at home, you will be taught how to prepare and give the pre-filled syringe or how to use the On-body Injector. Refer to the patient Instructions for Use for detailed instructions. Use exactly as directed. Tell your healthcare provider immediately if you suspect that the On-body Injector may not have performed as intended or if you suspect the use of the On-body Injector resulted in a missed or partial dose. It is important that you put your used needles and syringes in a special sharps container. Do not put them in a trash can. If you do not have a sharps container, call your pharmacist or healthcare provider to get one. Talk to your pediatrician regarding the use of this medicine in children. While this drug  may be prescribed for selected conditions, precautions do apply. Overdosage: If you think you have taken too much of this medicine contact a poison control center or emergency room at once. NOTE: This medicine is only for you. Do not share this medicine with others. What if I miss a dose? It is important not to miss your dose. Call your doctor or health care professional if you miss your dose. If you miss a dose due to an On-body Injector failure or leakage, a new dose should be administered as soon as possible using a single prefilled syringe for manual use. What may interact with this medicine? Interactions have not been studied. This list may not describe all possible interactions. Give your health care provider a list of all the medicines, herbs, non-prescription drugs, or dietary supplements you use. Also tell them if you smoke, drink alcohol, or use illegal drugs. Some items may interact with your medicine. What should I watch for while using this medicine? Your condition will be monitored carefully while you are receiving this medicine. You may need blood work done while you are taking this medicine. Talk to your health care provider about your risk of cancer. You may be more at risk for certain types of cancer if you take this medicine. If you are going to need a MRI, CT scan, or other procedure, tell your doctor that you are using this medicine (On-Body Injector only). What side effects may I notice from receiving this medicine? Side effects that you should report to your doctor or health care professional as soon as possible:  allergic reactions (skin rash, itching or hives, swelling of   the face, lips, or tongue)  back pain  dizziness  fever  pain, redness, or irritation at site where injected  pinpoint red spots on the skin  red or dark-brown urine  shortness of breath or breathing problems  stomach or side pain, or pain at the shoulder  swelling  tiredness  trouble  passing urine or change in the amount of urine  unusual bruising or bleeding Side effects that usually do not require medical attention (report to your doctor or health care professional if they continue or are bothersome):  bone pain  muscle pain This list may not describe all possible side effects. Call your doctor for medical advice about side effects. You may report side effects to FDA at 1-800-FDA-1088. Where should I keep my medicine? Keep out of the reach of children. If you are using this medicine at home, you will be instructed on how to store it. Throw away any unused medicine after the expiration date on the label. NOTE: This sheet is a summary. It may not cover all possible information. If you have questions about this medicine, talk to your doctor, pharmacist, or health care provider.  2021 [employer] Standard (2019-11-23 13:20:51)  

## 2021-01-24 ENCOUNTER — Ambulatory Visit: Payer: BC Managed Care – PPO

## 2021-01-29 ENCOUNTER — Inpatient Hospital Stay: Payer: BC Managed Care – PPO

## 2021-01-29 ENCOUNTER — Other Ambulatory Visit: Payer: BC Managed Care – PPO

## 2021-01-29 ENCOUNTER — Other Ambulatory Visit: Payer: Self-pay

## 2021-01-29 DIAGNOSIS — E7800 Pure hypercholesterolemia, unspecified: Secondary | ICD-10-CM | POA: Diagnosis not present

## 2021-01-29 DIAGNOSIS — R110 Nausea: Secondary | ICD-10-CM | POA: Diagnosis not present

## 2021-01-29 DIAGNOSIS — Z5112 Encounter for antineoplastic immunotherapy: Secondary | ICD-10-CM | POA: Diagnosis not present

## 2021-01-29 DIAGNOSIS — Z79899 Other long term (current) drug therapy: Secondary | ICD-10-CM | POA: Diagnosis not present

## 2021-01-29 DIAGNOSIS — G479 Sleep disorder, unspecified: Secondary | ICD-10-CM | POA: Diagnosis not present

## 2021-01-29 DIAGNOSIS — Z7984 Long term (current) use of oral hypoglycemic drugs: Secondary | ICD-10-CM | POA: Diagnosis not present

## 2021-01-29 DIAGNOSIS — C3411 Malignant neoplasm of upper lobe, right bronchus or lung: Secondary | ICD-10-CM

## 2021-01-29 DIAGNOSIS — R531 Weakness: Secondary | ICD-10-CM | POA: Diagnosis not present

## 2021-01-29 DIAGNOSIS — K219 Gastro-esophageal reflux disease without esophagitis: Secondary | ICD-10-CM | POA: Diagnosis not present

## 2021-01-29 DIAGNOSIS — Z5111 Encounter for antineoplastic chemotherapy: Secondary | ICD-10-CM | POA: Diagnosis not present

## 2021-01-29 DIAGNOSIS — R5383 Other fatigue: Secondary | ICD-10-CM | POA: Diagnosis not present

## 2021-01-29 DIAGNOSIS — E119 Type 2 diabetes mellitus without complications: Secondary | ICD-10-CM | POA: Diagnosis not present

## 2021-01-29 DIAGNOSIS — Z5189 Encounter for other specified aftercare: Secondary | ICD-10-CM | POA: Diagnosis not present

## 2021-01-29 DIAGNOSIS — R634 Abnormal weight loss: Secondary | ICD-10-CM | POA: Diagnosis not present

## 2021-01-29 DIAGNOSIS — Z7982 Long term (current) use of aspirin: Secondary | ICD-10-CM | POA: Diagnosis not present

## 2021-01-29 LAB — CBC WITH DIFFERENTIAL (CANCER CENTER ONLY)
Abs Immature Granulocytes: 0.08 10*3/uL — ABNORMAL HIGH (ref 0.00–0.07)
Basophils Absolute: 0.1 10*3/uL (ref 0.0–0.1)
Basophils Relative: 1 %
Eosinophils Absolute: 0.1 10*3/uL (ref 0.0–0.5)
Eosinophils Relative: 1 %
HCT: 30.1 % — ABNORMAL LOW (ref 39.0–52.0)
Hemoglobin: 10.1 g/dL — ABNORMAL LOW (ref 13.0–17.0)
Immature Granulocytes: 1 %
Lymphocytes Relative: 11 %
Lymphs Abs: 1 10*3/uL (ref 0.7–4.0)
MCH: 30.3 pg (ref 26.0–34.0)
MCHC: 33.6 g/dL (ref 30.0–36.0)
MCV: 90.4 fL (ref 80.0–100.0)
Monocytes Absolute: 1.1 10*3/uL — ABNORMAL HIGH (ref 0.1–1.0)
Monocytes Relative: 13 %
Neutro Abs: 6.6 10*3/uL (ref 1.7–7.7)
Neutrophils Relative %: 73 %
Platelet Count: 153 10*3/uL (ref 150–400)
RBC: 3.33 MIL/uL — ABNORMAL LOW (ref 4.22–5.81)
RDW: 17 % — ABNORMAL HIGH (ref 11.5–15.5)
WBC Count: 8.9 10*3/uL (ref 4.0–10.5)
nRBC: 0.2 % (ref 0.0–0.2)

## 2021-01-29 LAB — CMP (CANCER CENTER ONLY)
ALT: 13 U/L (ref 0–44)
AST: 14 U/L — ABNORMAL LOW (ref 15–41)
Albumin: 3.8 g/dL (ref 3.5–5.0)
Alkaline Phosphatase: 140 U/L — ABNORMAL HIGH (ref 38–126)
Anion gap: 9 (ref 5–15)
BUN: 12 mg/dL (ref 6–20)
CO2: 24 mmol/L (ref 22–32)
Calcium: 9.2 mg/dL (ref 8.9–10.3)
Chloride: 106 mmol/L (ref 98–111)
Creatinine: 1.02 mg/dL (ref 0.61–1.24)
GFR, Estimated: >60 mL/min (ref >60)
Glucose, Bld: 142 mg/dL — ABNORMAL HIGH (ref 70–99)
Potassium: 3.8 mmol/L (ref 3.5–5.1)
Sodium: 139 mmol/L (ref 135–145)
Total Bilirubin: 0.5 mg/dL (ref 0.3–1.2)
Total Protein: 6.4 g/dL — ABNORMAL LOW (ref 6.5–8.1)

## 2021-01-29 LAB — TSH: TSH: 4.559 u[IU]/mL — ABNORMAL HIGH (ref 0.320–4.118)

## 2021-02-04 ENCOUNTER — Other Ambulatory Visit: Payer: Self-pay | Admitting: Physician Assistant

## 2021-02-04 DIAGNOSIS — E039 Hypothyroidism, unspecified: Secondary | ICD-10-CM

## 2021-02-05 ENCOUNTER — Other Ambulatory Visit: Payer: Self-pay

## 2021-02-05 ENCOUNTER — Other Ambulatory Visit: Payer: BC Managed Care – PPO

## 2021-02-05 ENCOUNTER — Inpatient Hospital Stay: Payer: BC Managed Care – PPO

## 2021-02-05 DIAGNOSIS — R110 Nausea: Secondary | ICD-10-CM | POA: Diagnosis not present

## 2021-02-05 DIAGNOSIS — Z5112 Encounter for antineoplastic immunotherapy: Secondary | ICD-10-CM | POA: Diagnosis not present

## 2021-02-05 DIAGNOSIS — C3411 Malignant neoplasm of upper lobe, right bronchus or lung: Secondary | ICD-10-CM | POA: Diagnosis not present

## 2021-02-05 DIAGNOSIS — Z7984 Long term (current) use of oral hypoglycemic drugs: Secondary | ICD-10-CM | POA: Diagnosis not present

## 2021-02-05 DIAGNOSIS — R531 Weakness: Secondary | ICD-10-CM | POA: Diagnosis not present

## 2021-02-05 DIAGNOSIS — R634 Abnormal weight loss: Secondary | ICD-10-CM | POA: Diagnosis not present

## 2021-02-05 DIAGNOSIS — R5383 Other fatigue: Secondary | ICD-10-CM | POA: Diagnosis not present

## 2021-02-05 DIAGNOSIS — Z79899 Other long term (current) drug therapy: Secondary | ICD-10-CM | POA: Diagnosis not present

## 2021-02-05 DIAGNOSIS — E7800 Pure hypercholesterolemia, unspecified: Secondary | ICD-10-CM | POA: Diagnosis not present

## 2021-02-05 DIAGNOSIS — Z5111 Encounter for antineoplastic chemotherapy: Secondary | ICD-10-CM | POA: Diagnosis not present

## 2021-02-05 DIAGNOSIS — K219 Gastro-esophageal reflux disease without esophagitis: Secondary | ICD-10-CM | POA: Diagnosis not present

## 2021-02-05 DIAGNOSIS — Z5189 Encounter for other specified aftercare: Secondary | ICD-10-CM | POA: Diagnosis not present

## 2021-02-05 DIAGNOSIS — G479 Sleep disorder, unspecified: Secondary | ICD-10-CM | POA: Diagnosis not present

## 2021-02-05 DIAGNOSIS — E119 Type 2 diabetes mellitus without complications: Secondary | ICD-10-CM | POA: Diagnosis not present

## 2021-02-05 DIAGNOSIS — Z7982 Long term (current) use of aspirin: Secondary | ICD-10-CM | POA: Diagnosis not present

## 2021-02-05 LAB — CBC WITH DIFFERENTIAL (CANCER CENTER ONLY)
Abs Immature Granulocytes: 0.06 10*3/uL (ref 0.00–0.07)
Basophils Absolute: 0.1 10*3/uL (ref 0.0–0.1)
Basophils Relative: 1 %
Eosinophils Absolute: 0.2 10*3/uL (ref 0.0–0.5)
Eosinophils Relative: 2 %
HCT: 34.7 % — ABNORMAL LOW (ref 39.0–52.0)
Hemoglobin: 11.2 g/dL — ABNORMAL LOW (ref 13.0–17.0)
Immature Granulocytes: 0 %
Lymphocytes Relative: 10 %
Lymphs Abs: 1.5 10*3/uL (ref 0.7–4.0)
MCH: 30.1 pg (ref 26.0–34.0)
MCHC: 32.3 g/dL (ref 30.0–36.0)
MCV: 93.3 fL (ref 80.0–100.0)
Monocytes Absolute: 1.1 10*3/uL — ABNORMAL HIGH (ref 0.1–1.0)
Monocytes Relative: 7 %
Neutro Abs: 12.1 10*3/uL — ABNORMAL HIGH (ref 1.7–7.7)
Neutrophils Relative %: 80 %
Platelet Count: 208 10*3/uL (ref 150–400)
RBC: 3.72 MIL/uL — ABNORMAL LOW (ref 4.22–5.81)
RDW: 17 % — ABNORMAL HIGH (ref 11.5–15.5)
WBC Count: 15 10*3/uL — ABNORMAL HIGH (ref 4.0–10.5)
nRBC: 0 % (ref 0.0–0.2)

## 2021-02-05 LAB — CMP (CANCER CENTER ONLY)
ALT: 17 U/L (ref 0–44)
AST: 16 U/L (ref 15–41)
Albumin: 4 g/dL (ref 3.5–5.0)
Alkaline Phosphatase: 148 U/L — ABNORMAL HIGH (ref 38–126)
Anion gap: 10 (ref 5–15)
BUN: 16 mg/dL (ref 6–20)
CO2: 25 mmol/L (ref 22–32)
Calcium: 9.7 mg/dL (ref 8.9–10.3)
Chloride: 103 mmol/L (ref 98–111)
Creatinine: 1.41 mg/dL — ABNORMAL HIGH (ref 0.61–1.24)
GFR, Estimated: 57 mL/min — ABNORMAL LOW (ref >60)
Glucose, Bld: 156 mg/dL — ABNORMAL HIGH (ref 70–99)
Potassium: 4.8 mmol/L (ref 3.5–5.1)
Sodium: 138 mmol/L (ref 135–145)
Total Bilirubin: 0.5 mg/dL (ref 0.3–1.2)
Total Protein: 7.4 g/dL (ref 6.5–8.1)

## 2021-02-06 ENCOUNTER — Other Ambulatory Visit: Payer: Self-pay | Admitting: Osteopathic Medicine

## 2021-02-09 ENCOUNTER — Encounter: Payer: Self-pay | Admitting: Internal Medicine

## 2021-02-09 ENCOUNTER — Other Ambulatory Visit: Payer: Self-pay

## 2021-02-09 ENCOUNTER — Ambulatory Visit (INDEPENDENT_AMBULATORY_CARE_PROVIDER_SITE_OTHER): Payer: BC Managed Care – PPO

## 2021-02-09 ENCOUNTER — Other Ambulatory Visit: Payer: BC Managed Care – PPO

## 2021-02-09 DIAGNOSIS — N200 Calculus of kidney: Secondary | ICD-10-CM | POA: Diagnosis not present

## 2021-02-09 DIAGNOSIS — K5750 Diverticulosis of both small and large intestine without perforation or abscess without bleeding: Secondary | ICD-10-CM | POA: Diagnosis not present

## 2021-02-09 DIAGNOSIS — C3490 Malignant neoplasm of unspecified part of unspecified bronchus or lung: Secondary | ICD-10-CM | POA: Diagnosis not present

## 2021-02-09 DIAGNOSIS — M47816 Spondylosis without myelopathy or radiculopathy, lumbar region: Secondary | ICD-10-CM | POA: Diagnosis not present

## 2021-02-09 DIAGNOSIS — I2510 Atherosclerotic heart disease of native coronary artery without angina pectoris: Secondary | ICD-10-CM | POA: Diagnosis not present

## 2021-02-09 DIAGNOSIS — K7689 Other specified diseases of liver: Secondary | ICD-10-CM | POA: Diagnosis not present

## 2021-02-09 DIAGNOSIS — J90 Pleural effusion, not elsewhere classified: Secondary | ICD-10-CM | POA: Diagnosis not present

## 2021-02-09 DIAGNOSIS — J218 Acute bronchiolitis due to other specified organisms: Secondary | ICD-10-CM | POA: Diagnosis not present

## 2021-02-09 MED ORDER — IOHEXOL 300 MG/ML  SOLN
100.0000 mL | Freq: Once | INTRAMUSCULAR | Status: AC | PRN
Start: 2021-02-09 — End: 2021-02-09
  Administered 2021-02-09: 100 mL via INTRAVENOUS

## 2021-02-10 ENCOUNTER — Telehealth: Payer: Self-pay | Admitting: Internal Medicine

## 2021-02-10 NOTE — Telephone Encounter (Signed)
R/s appts per 3/29 sch msg. Called pt, no answer. Left msg with appts dates and times.

## 2021-02-11 ENCOUNTER — Telehealth: Payer: Self-pay | Admitting: Internal Medicine

## 2021-02-11 ENCOUNTER — Inpatient Hospital Stay: Payer: BC Managed Care – PPO

## 2021-02-11 ENCOUNTER — Encounter: Payer: Self-pay | Admitting: Internal Medicine

## 2021-02-11 ENCOUNTER — Encounter: Payer: Self-pay | Admitting: *Deleted

## 2021-02-11 ENCOUNTER — Inpatient Hospital Stay (HOSPITAL_BASED_OUTPATIENT_CLINIC_OR_DEPARTMENT_OTHER): Payer: BC Managed Care – PPO | Admitting: Internal Medicine

## 2021-02-11 ENCOUNTER — Other Ambulatory Visit: Payer: Self-pay

## 2021-02-11 VITALS — HR 96

## 2021-02-11 VITALS — BP 120/87 | HR 107 | Temp 96.3°F | Resp 19 | Ht 72.0 in | Wt 237.9 lb

## 2021-02-11 DIAGNOSIS — R634 Abnormal weight loss: Secondary | ICD-10-CM | POA: Diagnosis not present

## 2021-02-11 DIAGNOSIS — R531 Weakness: Secondary | ICD-10-CM | POA: Diagnosis not present

## 2021-02-11 DIAGNOSIS — R5383 Other fatigue: Secondary | ICD-10-CM | POA: Diagnosis not present

## 2021-02-11 DIAGNOSIS — K219 Gastro-esophageal reflux disease without esophagitis: Secondary | ICD-10-CM | POA: Diagnosis not present

## 2021-02-11 DIAGNOSIS — C3411 Malignant neoplasm of upper lobe, right bronchus or lung: Secondary | ICD-10-CM | POA: Diagnosis not present

## 2021-02-11 DIAGNOSIS — Z5189 Encounter for other specified aftercare: Secondary | ICD-10-CM | POA: Diagnosis not present

## 2021-02-11 DIAGNOSIS — Z7984 Long term (current) use of oral hypoglycemic drugs: Secondary | ICD-10-CM | POA: Diagnosis not present

## 2021-02-11 DIAGNOSIS — Z7982 Long term (current) use of aspirin: Secondary | ICD-10-CM | POA: Diagnosis not present

## 2021-02-11 DIAGNOSIS — Z5111 Encounter for antineoplastic chemotherapy: Secondary | ICD-10-CM | POA: Diagnosis not present

## 2021-02-11 DIAGNOSIS — R110 Nausea: Secondary | ICD-10-CM | POA: Diagnosis not present

## 2021-02-11 DIAGNOSIS — Z5112 Encounter for antineoplastic immunotherapy: Secondary | ICD-10-CM | POA: Diagnosis not present

## 2021-02-11 DIAGNOSIS — G479 Sleep disorder, unspecified: Secondary | ICD-10-CM | POA: Diagnosis not present

## 2021-02-11 DIAGNOSIS — E7800 Pure hypercholesterolemia, unspecified: Secondary | ICD-10-CM | POA: Diagnosis not present

## 2021-02-11 DIAGNOSIS — Z79899 Other long term (current) drug therapy: Secondary | ICD-10-CM | POA: Diagnosis not present

## 2021-02-11 DIAGNOSIS — E119 Type 2 diabetes mellitus without complications: Secondary | ICD-10-CM | POA: Diagnosis not present

## 2021-02-11 LAB — CMP (CANCER CENTER ONLY)
ALT: 13 U/L (ref 0–44)
AST: 12 U/L — ABNORMAL LOW (ref 15–41)
Albumin: 3.5 g/dL (ref 3.5–5.0)
Alkaline Phosphatase: 127 U/L — ABNORMAL HIGH (ref 38–126)
Anion gap: 12 (ref 5–15)
BUN: 16 mg/dL (ref 6–20)
CO2: 21 mmol/L — ABNORMAL LOW (ref 22–32)
Calcium: 8.8 mg/dL — ABNORMAL LOW (ref 8.9–10.3)
Chloride: 106 mmol/L (ref 98–111)
Creatinine: 1.2 mg/dL (ref 0.61–1.24)
GFR, Estimated: >60 mL/min (ref >60)
Glucose, Bld: 230 mg/dL — ABNORMAL HIGH (ref 70–99)
Potassium: 4.3 mmol/L (ref 3.5–5.1)
Sodium: 139 mmol/L (ref 135–145)
Total Bilirubin: 0.4 mg/dL (ref 0.3–1.2)
Total Protein: 7.2 g/dL (ref 6.5–8.1)

## 2021-02-11 LAB — CBC WITH DIFFERENTIAL (CANCER CENTER ONLY)
Abs Immature Granulocytes: 0.08 10*3/uL — ABNORMAL HIGH (ref 0.00–0.07)
Basophils Absolute: 0.1 10*3/uL (ref 0.0–0.1)
Basophils Relative: 0 %
Eosinophils Absolute: 0 10*3/uL (ref 0.0–0.5)
Eosinophils Relative: 0 %
HCT: 29.7 % — ABNORMAL LOW (ref 39.0–52.0)
Hemoglobin: 9.8 g/dL — ABNORMAL LOW (ref 13.0–17.0)
Immature Granulocytes: 1 %
Lymphocytes Relative: 5 %
Lymphs Abs: 0.8 10*3/uL (ref 0.7–4.0)
MCH: 30.5 pg (ref 26.0–34.0)
MCHC: 33 g/dL (ref 30.0–36.0)
MCV: 92.5 fL (ref 80.0–100.0)
Monocytes Absolute: 1 10*3/uL (ref 0.1–1.0)
Monocytes Relative: 6 %
Neutro Abs: 14.7 10*3/uL — ABNORMAL HIGH (ref 1.7–7.7)
Neutrophils Relative %: 88 %
Platelet Count: 215 10*3/uL (ref 150–400)
RBC: 3.21 MIL/uL — ABNORMAL LOW (ref 4.22–5.81)
RDW: 16.4 % — ABNORMAL HIGH (ref 11.5–15.5)
WBC Count: 16.6 10*3/uL — ABNORMAL HIGH (ref 4.0–10.5)
nRBC: 0.1 % (ref 0.0–0.2)

## 2021-02-11 LAB — TOTAL PROTEIN, URINE DIPSTICK: Protein, ur: 30 mg/dL — AB

## 2021-02-11 MED ORDER — SODIUM CHLORIDE 0.9% FLUSH
10.0000 mL | INTRAVENOUS | Status: DC | PRN
  Administered 2021-02-11: 10 mL
  Filled 2021-02-11: qty 10

## 2021-02-11 MED ORDER — ACETAMINOPHEN 325 MG PO TABS
650.0000 mg | ORAL_TABLET | Freq: Once | ORAL | Status: AC
  Administered 2021-02-11: 650 mg via ORAL

## 2021-02-11 MED ORDER — HEPARIN SOD (PORK) LOCK FLUSH 100 UNIT/ML IV SOLN
500.0000 [IU] | Freq: Once | INTRAVENOUS | Status: AC | PRN
  Administered 2021-02-11: 500 [IU]
  Filled 2021-02-11: qty 5

## 2021-02-11 MED ORDER — DIPHENHYDRAMINE HCL 50 MG/ML IJ SOLN
INTRAMUSCULAR | Status: AC
  Filled 2021-02-11: qty 1

## 2021-02-11 MED ORDER — ACETAMINOPHEN 325 MG PO TABS
ORAL_TABLET | ORAL | Status: AC
  Filled 2021-02-11: qty 2

## 2021-02-11 MED ORDER — SODIUM CHLORIDE 0.9 % IV SOLN
75.0000 mg/m2 | Freq: Once | INTRAVENOUS | Status: AC
  Administered 2021-02-11: 180 mg via INTRAVENOUS
  Filled 2021-02-11: qty 18

## 2021-02-11 MED ORDER — SODIUM CHLORIDE 0.9 % IV SOLN
10.0000 mg/kg | Freq: Once | INTRAVENOUS | Status: AC
  Administered 2021-02-11: 1100 mg via INTRAVENOUS
  Filled 2021-02-11: qty 100

## 2021-02-11 MED ORDER — SODIUM CHLORIDE 0.9 % IV SOLN
10.0000 mg | Freq: Once | INTRAVENOUS | Status: AC
  Administered 2021-02-11: 10 mg via INTRAVENOUS
  Filled 2021-02-11: qty 10

## 2021-02-11 MED ORDER — DIPHENHYDRAMINE HCL 50 MG/ML IJ SOLN
50.0000 mg | Freq: Once | INTRAMUSCULAR | Status: AC
  Administered 2021-02-11: 50 mg via INTRAVENOUS

## 2021-02-11 MED ORDER — SODIUM CHLORIDE 0.9 % IV SOLN
Freq: Once | INTRAVENOUS | Status: AC
  Filled 2021-02-11: qty 250

## 2021-02-11 NOTE — Patient Instructions (Signed)
[facility] Discharge Instructions for Patients Receiving Chemotherapy  Today you received the following chemotherapy agents: Cyramza and Docetaxel.  To help prevent nausea and vomiting after your treatment, we encourage you to take your nausea medication as directed by your MD.   If you develop nausea and vomiting that is not controlled by your nausea medication, call the clinic.   BELOW ARE SYMPTOMS THAT SHOULD BE REPORTED IMMEDIATELY:  *FEVER GREATER THAN 100.5 F  *CHILLS WITH OR WITHOUT FEVER  NAUSEA AND VOMITING THAT IS NOT CONTROLLED WITH YOUR NAUSEA MEDICATION  *UNUSUAL SHORTNESS OF BREATH  *UNUSUAL BRUISING OR BLEEDING  TENDERNESS IN MOUTH AND THROAT WITH OR WITHOUT PRESENCE OF ULCERS  *URINARY PROBLEMS  *BOWEL PROBLEMS  UNUSUAL RASH Items with * indicate a potential emergency and should be followed up as soon as possible.  Feel free to call the clinic should you have any questions or concerns. The clinic phone number is [phone].  Please show the [facility] at check-in to the Emergency Department and triage nurse.

## 2021-02-11 NOTE — Progress Notes (Signed)
Spoke with William Farrell and his wife today.  Dr. Julien Nordmann updated him on treatment plan and for patient to be seen with [facility]. Referral placed.

## 2021-02-11 NOTE — Progress Notes (Signed)
[place] [phone]   [phone]  OFFICE PROGRESS NOTE  [name], DO [address] [place] [address]  DIAGNOSIS: Recurrent/progressive non-small cell lung cancer initially diagnosed as stage IIIA(T2a, N2, M0) non-small cell lung cancer, squamous cell carcinoma diagnosed in April 2021, presented with right upper lobe/suprahilar lung mass in addition to right paratracheal and subcarinal lymphadenopathy.  Molecular Studies by [employer]: No actionable mutations  PRIOR THERAPY:  1) Concurrent chemoradiation with weekly carboplatin for AUC of 2 and paclitaxel 45 NG/M2. First dose on Mar 31, 2020. Status post 7 cycles. Last dose was given on 05/12/2020 with partial response. 2) Consolidation immunotherapy with durvalumab 1500 mg IV every 4 weeks. First dose May 12, 2020.Status post 3 cycles. 3) Systemic chemotherapy with carboplatin for an AUC of 5, paclitaxel 175 mg/m2, and Keytruda 200 mg IV every 3 weeks. First dose expected on 10/02/20.   Status post 3 cycles.  Last dose was given on November 13, 2020.  CURRENT THERAPY:  Second line systemic chemotherapy with docetaxel 75 Mg/M2 and Cyramza 10 mg/KG every 3 weeks with Neulasta support.  First dose December 11, 2020.  Status post 3 cycles.   INTERVAL HISTORY: William Farrell 60 y.o. male returns to the clinic today for follow-up visit accompanied by his wife.  The patient continues to complain of generalized fatigue and weakness as well as difficulty swallowing and weight loss.  He is scheduled to see gastroenterology on 422 for esophageal dilatation at [facility].  The patient denied having any current chest pain but has shortness of breath with exertion with no cough or hemoptysis.  He denied having any fever or chills.  He has no nausea, vomiting, diarrhea or constipation.  He has no headache or visual changes.  He has been tolerating his systemic chemotherapy  fairly well.  He had repeat CT scan of the chest, abdomen pelvis performed recently and is here for evaluation and discussion of his scan results.  MEDICAL HISTORY: Past Medical History:  Diagnosis Date  . Asthma    as a child  . Diabetes ([place])   . Diverticulitis   . GERD (gastroesophageal reflux disease)   . High cholesterol   . History of kidney stones     ALLERGIES:  is allergic to sitagliptin.  MEDICATIONS:  Current Outpatient Medications  Medication Sig Dispense Refill  . acetaminophen (TYLENOL) 500 MG tablet Take 1,000 mg by mouth every 6 (six) hours as needed for mild pain.    Marland Kitchen albuterol (VENTOLIN HFA) 108 (90 Base) MCG/ACT inhaler Inhale 1-2 puffs into the lungs every 4 (four) hours as needed for wheezing or shortness of breath. 18 g 99  . aspirin EC 81 MG tablet Take 81 mg by mouth at bedtime.     Marland Kitchen atorvastatin (LIPITOR) 40 MG tablet TAKE 1 TABLET DAILY (Patient taking differently: Take 40 mg by mouth at bedtime.) 90 tablet 1  . calcium carbonate (TUMS - DOSED IN MG ELEMENTAL CALCIUM) 500 MG chewable tablet Chew 2 tablets by mouth 3 (three) times daily as needed for indigestion or heartburn.     . dexamethasone (DECADRON) 4 MG tablet 1 tablet p.o. twice daily the day before, day of and day after chemotherapy every 3 weeks. 40 tablet 0  . docusate sodium (COLACE) 100 MG capsule Take 100 mg by mouth daily.    Marland Kitchen glipiZIDE (GLUCOTROL) 5 MG tablet Take 1 tablet (5 mg total) by mouth 2 (two)  times daily before a meal. 180 tablet 3  . Lancets (ONETOUCH DELICA PLUS [id number]) MISC USE AS DIRECTED UP TO FOUR TIMES A DAY 100 each 13  . levothyroxine (SYNTHROID) 50 MCG tablet TAKE 1 TABLET BY MOUTH DAILY BEFORE BREAKFAST 30 tablet 1  . lidocaine-prilocaine (EMLA) cream Apply 1 application topically as needed. 30 g 2  . meloxicam (MOBIC) 15 MG tablet TAKE 1 TABLET DAILY (Patient taking differently: Take 15 mg by mouth daily.) 90 tablet 3  . metFORMIN (GLUCOPHAGE) 1000 MG tablet TAKE 1  TABLET TWICE A DAY WITH MEALS (Patient taking differently: Take 500 mg by mouth 2 (two) times daily with a meal.) 180 tablet 3  . ONETOUCH ULTRA test strip USE AS INSTRUCTED UP TO FOUR TIMES A DAY 100 strip 13  . oxyCODONE-acetaminophen (PERCOCET/ROXICET) 5-325 MG tablet Take 1 tablet by mouth every 8 (eight) hours as needed for severe pain. 30 tablet 0  . prochlorperazine (COMPAZINE) 10 MG tablet TAKE 1 TABLET BY MOUTH EVERY 6 HOURS AS NEEDED FOR NAUSEA OR VOMITING. 30 tablet 2  . Semaglutide,0.25 or 0.5MG /DOS, (OZEMPIC, 0.25 OR 0.5 MG/DOSE,) 2 MG/1.5ML SOPN Inject 0.5 mg into the skin once a week. 7.5 mL 1  . temazepam (RESTORIL) 15 MG capsule Take 1 capsule (15 mg total) by mouth at bedtime as needed for sleep. 30 capsule 0  . trolamine salicylate (ASPERCREME) 10 % cream Apply 1 application topically 2 (two) times daily as needed for muscle pain.      No current facility-administered medications for this visit.    SURGICAL HISTORY:  Past Surgical History:  Procedure Laterality Date  . broken bone repair    . BRONCHIAL BIOPSY  03/11/2020   Procedure: BRONCHIAL BIOPSIES;  Surgeon: Garner Nash, DO;  Location: [facility];  Service: Pulmonary;;  . COLONOSCOPY    . CRYOTHERAPY  03/11/2020   Procedure: CRYOTHERAPY;  Surgeon: Garner Nash, DO;  Location: [facility];  Service: Pulmonary;;  . FINE NEEDLE ASPIRATION  03/11/2020   Procedure: FINE NEEDLE ASPIRATION (FNA) LINEAR;  Surgeon: Garner Nash, DO;  Location: [facility];  Service: Pulmonary;;  . IR IMAGING GUIDED PORT INSERTION  10/16/2020  . KNEE ARTHROSCOPY WITH ANTERIOR CRUCIATE LIGAMENT (ACL) REPAIR     x 2  . VIDEO BRONCHOSCOPY WITH ENDOBRONCHIAL ULTRASOUND N/A 03/11/2020   Procedure: VIDEO BRONCHOSCOPY WITH ENDOBRONCHIAL ULTRASOUND;  Surgeon: Garner Nash, DO;  Location: [facility];  Service: Pulmonary;  Laterality: N/A;    REVIEW OF SYSTEMS:  Constitutional: positive for fatigue and weight loss Eyes:  negative Ears, nose, mouth, throat, and face: negative Respiratory: positive for cough and dyspnea on exertion Cardiovascular: negative Gastrointestinal: positive for dysphagia Genitourinary:negative Integument/breast: negative Hematologic/lymphatic: negative Musculoskeletal:negative Neurological: negative Behavioral/Psych: negative Endocrine: negative Allergic/Immunologic: negative   PHYSICAL EXAMINATION: General appearance: alert, cooperative, fatigued and no distress Head: Normocephalic, without obvious abnormality, atraumatic Neck: no adenopathy, no JVD, supple, symmetrical, trachea midline and thyroid not enlarged, symmetric, no tenderness/mass/nodules Lymph nodes: Cervical, supraclavicular, and axillary nodes normal. Resp: clear to auscultation bilaterally Back: symmetric, no curvature. ROM normal. No CVA tenderness. Cardio: regular rate and rhythm, S1, S2 normal, no murmur, click, rub or gallop GI: soft, non-tender; bowel sounds normal; no masses,  no organomegaly Extremities: extremities normal, atraumatic, no cyanosis or edema Neurologic: Alert and oriented X 3, normal strength and tone. Normal symmetric reflexes. Normal coordination and gait  ECOG PERFORMANCE STATUS: 1 - Symptomatic but completely ambulatory  Blood pressure 120/87, pulse (!) 107, temperature (!) 96.3  F (35.7 C), temperature source Tympanic, resp. rate 19, height 6' (1.829 m), weight 237 lb 14.4 oz (107.9 kg), SpO2 98 %.  LABORATORY DATA: Lab Results  Component Value Date   WBC 16.6 (H) 02/11/2021   HGB 9.8 (L) 02/11/2021   HCT 29.7 (L) 02/11/2021   MCV 92.5 02/11/2021   PLT 215 02/11/2021      Chemistry      Component Value Date/Time   NA 138 02/05/2021 0806   K 4.8 02/05/2021 0806   CL 103 02/05/2021 0806   CO2 25 02/05/2021 0806   BUN 16 02/05/2021 0806   CREATININE 1.41 (H) 02/05/2021 0806   CREATININE 1.10 01/14/2020 0859      Component Value Date/Time   CALCIUM 9.7 02/05/2021 0806    ALKPHOS 148 (H) 02/05/2021 0806   AST 16 02/05/2021 0806   ALT 17 02/05/2021 0806   BILITOT 0.5 02/05/2021 0806       RADIOGRAPHIC STUDIES: CT CHEST ABDOMEN PELVIS W CONTRAST  Result Date: 02/09/2021 CLINICAL DATA:  Restaging non-small cell lung cancer EXAM: CT CHEST, ABDOMEN, AND PELVIS WITH CONTRAST TECHNIQUE: Multidetector CT imaging of the chest, abdomen and pelvis was performed following the standard protocol during bolus administration of intravenous contrast. CONTRAST:  151mL OMNIPAQUE IOHEXOL 300 MG/ML  SOLN COMPARISON:  12/03/2020 FINDINGS: CT CHEST FINDINGS Cardiovascular: Left IJ Port-A-Cath tip: SVC. Mediastinum/Nodes: Right paratracheal node 1.1 cm in short axis on image 24 series 2, formerly 1.0 cm by my measurements. Subcarinal node 1.1 cm in short axis on image 31 series 2, formerly 1.2 cm. Right lower paraesophageal lymph node 0.7 cm in short axis on image 51 series 2, formerly the same. Right subpectoral lymph node 1.4 cm in short axis on image 13 series 2, previously the same by my measurements. Right level measures 1.6 cm in short IV lymph axis node in the lower neck on image 9 series 2, formerly the same by my measurements. Lungs/Pleura: The right suprahilar mass measures 3.6 by 2.7 cm on image 64 of series 3, previously 3.8 by 2.7 cm by my measurements. Some of the atelectasis along the margin of this lesion has resolved. There is continued peribronchovascular density in airway plugging extending cephalad, but increased size and confluence of the two apical nodules in the right lung, now forming a single nodule measuring 3.0 by 2.0 cm. Increase in size and number of apical satellite nodules for example on images 33-38 of series 3. Notable atelectasis anteriorly in the right upper lobe and posteriorly in the right upper lobe with some shift of cardiac and mediastinal structures to the right. Stable nodularity at the left lung apex probably from old granulomatous disease.  Tree-in-bud nodularity medially in the anterior portion of the left upper lobe suggesting chronic atypical infectious bronchiolitis, similar to the prior exam. In addition to the localized diffusion posterior to the right hilum that was present previously, there is also now a trace dependent effusion posteriorly in the right hemithorax. Musculoskeletal: Degenerative glenohumeral arthropathy. Faint sclerosis in the right sternum is unchanged/nonprogressive. CT ABDOMEN PELVIS FINDINGS Hepatobiliary: The very subtle 6 mm hypodensity posteriorly in the right hepatic lobe on image 67 of series 2 is unchanged and nonspecific. No new or enlarging liver lesion. Gallbladder unremarkable. No biliary dilatation. Pancreas: Unremarkable Spleen: Upper normal splenic size.  No focal splenic lesion. Adrenals/Urinary Tract: 2 mm right kidney lower pole nonobstructive renal calculus. No significant adrenal abnormality. The kidneys appear normal. Stomach/Bowel: Sigmoid colon diverticulosis. Vascular/Lymphatic: Aortoiliac atherosclerotic vascular disease.  Aortocaval node 0.9 cm in short axis on image 80 series 2, stable. Reproductive: Unremarkable Other: No supplemental non-categorized findings. 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 Lumbar spondylosis and degenerative disc disease resulting in impingement at L2-3, L3-4, and L4-5. Fatty spermatic cords. IMPRESSION: 1. Previous adjacent nodules at the right lung apex have enlarged and now form a confluent nodular mass measuring 3.0 by 2.0 cm. 2. Mild increase in size of the right pleural effusion. This was previously localized in the right retro hilar region (possibly related to prior radiation therapy) but now there is also a small free-flowing component of right pleural effusion. 3. Otherwise the right suprahilar mass and regional lower neck and thoracic lymph nodes appear stable. Upper normal sized aortocaval lymph node appears stable. 4.  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 Stable subtle nonspecific 6 mm hypodensity in the right hepatic lobe. Nonobstructive right nephrolithiasis. Aortoiliac atherosclerotic vascular disease. Multilevel lumbar impingement. Electronically Signed   By: Van Clines M.D.   On: 02/09/2021 14:36    ASSESSMENT AND PLAN: This is a 60 years old white male was recently diagnosed with stage IIIa non-small cell lung cancer, squamous cell carcinoma in April 2021 presented with right upper lobe/suprahilar lung mass in addition to right paratracheal and subcarinal lymphadenopathy. The patient completed a course of concurrent chemoradiation with weekly carboplatin for AUC of 2 and paclitaxel 45 MG/M2.  He is status post 7 cycles.  He tolerated the previous course of his treatment well except for mild dysphagia and odynophagia. He underwent consolidation treatment with immunotherapy with Imfinzi 1500 mg IV every 4 weeks.  Status post 3 cycles.  The patient tolerated the treatment well but unfortunately he has evidence for disease progression after cycle #3. He is started first-line treatment with chemotherapy with carboplatin for AUC of 5, paclitaxel 175 mg/M2 and Keytruda 200 mg IV every 3 weeks.  Status post 3 cycles.  He has been tolerating this treatment well with no concerning adverse effects except for mild fatigue. The patient had repeat CT scan of the chest, abdomen pelvis performed recently.  I personally and independently reviewed the scan images and discussed the result and showed the images to the patient today. Unfortunately his scan showed enlarging nodules and masses in the chest with enlarging lymph nodes.  This is definitely highly suspicious for disease progression but to the progression on immunotherapy could not be completely excluded. He is currently undergoing palliative systemic chemotherapy  with second line docetaxel 75 Mg/M2 and Cyramza 10 mg/KG every 3 weeks with Neulasta support.  Status post 3 cycles.   The patient has been tolerating this treatment well with no concerning adverse effect except for fatigue. He had repeat CT scan of the chest, abdomen pelvis performed recently.  I personally and independently reviewed the scan images and discussed the result and showed the images to the patient today. Unfortunately his scan showed evidence for disease progression and the right upper lobe nodules are currently matted together to a larger mass.  His other disease are generally stable. I discussed with the patient his treatment options including SBRT to the enlarging right upper lobe nodules and continuing his current medication with docetaxel and Cyramza versus a change of chemotherapy to a different regimen versus referral back to [facility] for second opinion but the patient declined the last 2 options. He will see Dr. Lisbeth Renshaw for consideration of SBRT to the enlarging  right upper lobe lung mass.  The patient will proceed with cycle #4 of his systemic chemotherapy today as planned. I will see him back for follow-up visit in 3 weeks for evaluation before the next cycle of his treatment. For the COPD, I will refer the patient back to Dr. Valeta Harms for evaluation and management. He was advised to call immediately if he has any other concerning symptoms in the interval. For the diabetes mellitus he is currently on Ozempic and he will follow-up with his primary care physician for regulation of his medication. For the insomnia, I will start the patient on Restoril 15 mg p.o. nightly as needed.  The patient voices understanding of current disease status and treatment options and is in agreement with the current care plan. All questions were answered. The patient knows to call the clinic with any problems, questions or concerns. We can certainly see the patient much sooner if necessary.  Disclaimer: This  note was dictated with voice recognition software. Similar sounding words can inadvertently be transcribed and may not be corrected upon review.

## 2021-02-11 NOTE — Telephone Encounter (Signed)
Appts scheduled in [place] per 3/30 sch msg. Pt's wife is aware.

## 2021-02-12 ENCOUNTER — Inpatient Hospital Stay: Payer: BC Managed Care – PPO

## 2021-02-12 VITALS — BP 114/72 | HR 103 | Temp 97.8°F | Resp 20

## 2021-02-12 DIAGNOSIS — E119 Type 2 diabetes mellitus without complications: Secondary | ICD-10-CM | POA: Diagnosis not present

## 2021-02-12 DIAGNOSIS — C3411 Malignant neoplasm of upper lobe, right bronchus or lung: Secondary | ICD-10-CM

## 2021-02-12 DIAGNOSIS — R110 Nausea: Secondary | ICD-10-CM | POA: Diagnosis not present

## 2021-02-12 DIAGNOSIS — R531 Weakness: Secondary | ICD-10-CM | POA: Diagnosis not present

## 2021-02-12 DIAGNOSIS — E7800 Pure hypercholesterolemia, unspecified: Secondary | ICD-10-CM | POA: Diagnosis not present

## 2021-02-12 DIAGNOSIS — G479 Sleep disorder, unspecified: Secondary | ICD-10-CM | POA: Diagnosis not present

## 2021-02-12 DIAGNOSIS — Z5111 Encounter for antineoplastic chemotherapy: Secondary | ICD-10-CM | POA: Diagnosis not present

## 2021-02-12 DIAGNOSIS — R634 Abnormal weight loss: Secondary | ICD-10-CM | POA: Diagnosis not present

## 2021-02-12 DIAGNOSIS — Z5112 Encounter for antineoplastic immunotherapy: Secondary | ICD-10-CM | POA: Diagnosis not present

## 2021-02-12 DIAGNOSIS — R5383 Other fatigue: Secondary | ICD-10-CM | POA: Diagnosis not present

## 2021-02-12 DIAGNOSIS — Z5189 Encounter for other specified aftercare: Secondary | ICD-10-CM | POA: Diagnosis not present

## 2021-02-12 DIAGNOSIS — Z7982 Long term (current) use of aspirin: Secondary | ICD-10-CM | POA: Diagnosis not present

## 2021-02-12 DIAGNOSIS — K219 Gastro-esophageal reflux disease without esophagitis: Secondary | ICD-10-CM | POA: Diagnosis not present

## 2021-02-12 DIAGNOSIS — Z7984 Long term (current) use of oral hypoglycemic drugs: Secondary | ICD-10-CM | POA: Diagnosis not present

## 2021-02-12 DIAGNOSIS — Z79899 Other long term (current) drug therapy: Secondary | ICD-10-CM | POA: Diagnosis not present

## 2021-02-12 MED ORDER — PEGFILGRASTIM-CBQV 6 MG/0.6ML ~~LOC~~ SOSY
6.0000 mg | PREFILLED_SYRINGE | Freq: Once | SUBCUTANEOUS | Status: AC
  Administered 2021-02-12: 6 mg via SUBCUTANEOUS

## 2021-02-12 MED ORDER — PEGFILGRASTIM-CBQV 6 MG/0.6ML ~~LOC~~ SOSY
PREFILLED_SYRINGE | SUBCUTANEOUS | Status: AC
  Filled 2021-02-12: qty 0.6

## 2021-02-16 ENCOUNTER — Encounter: Payer: Self-pay | Admitting: Internal Medicine

## 2021-02-16 ENCOUNTER — Telehealth: Payer: Self-pay | Admitting: Medical Oncology

## 2021-02-16 ENCOUNTER — Other Ambulatory Visit: Payer: Self-pay | Admitting: Medical Oncology

## 2021-02-16 DIAGNOSIS — R112 Nausea with vomiting, unspecified: Secondary | ICD-10-CM

## 2021-02-16 MED ORDER — ONDANSETRON HCL 8 MG PO TABS: 8.0000 mg | ORAL_TABLET | Freq: Three times a day (TID) | ORAL | 0 refills | Status: DC | PRN

## 2021-02-16 NOTE — Telephone Encounter (Signed)
Pt has nausea and vomiting ( less than a cup liquid each time.) since Wednesday .  Compazine is not helping.   Per Dr Julien Nordmann he ordered Zofran prn and wife notified.

## 2021-02-17 ENCOUNTER — Other Ambulatory Visit: Payer: Self-pay | Admitting: Medical Oncology

## 2021-02-17 ENCOUNTER — Telehealth: Payer: Self-pay | Admitting: Medical Oncology

## 2021-02-17 ENCOUNTER — Encounter: Payer: Self-pay | Admitting: Medical Oncology

## 2021-02-17 ENCOUNTER — Encounter: Payer: Self-pay | Admitting: Internal Medicine

## 2021-02-17 DIAGNOSIS — R630 Anorexia: Secondary | ICD-10-CM

## 2021-02-17 DIAGNOSIS — R112 Nausea with vomiting, unspecified: Secondary | ICD-10-CM

## 2021-02-17 MED ORDER — METHYLPREDNISOLONE 4 MG PO TBPK: ORAL_TABLET | ORAL | 0 refills | Status: DC

## 2021-02-17 NOTE — Telephone Encounter (Signed)
Medrol dose pak for now.

## 2021-02-17 NOTE — Telephone Encounter (Signed)
IVF-"If he is not feeling better by Thursday he has a 9:00 appointment with Dr Michelene Heady assistant can he get fluids after that -[name]"  I offered IVF today or tomorrow . He declined and just now started zofran. I sent schedule message for  IVF on Thursday   Appetite stimulant-Pt does not have an appetite and requests appetite stimulant.

## 2021-02-18 ENCOUNTER — Encounter: Payer: Self-pay | Admitting: Internal Medicine

## 2021-02-18 ENCOUNTER — Inpatient Hospital Stay: Payer: BC Managed Care – PPO

## 2021-02-19 ENCOUNTER — Ambulatory Visit
Admission: RE | Admit: 2021-02-19 | Discharge: 2021-02-19 | Disposition: A | Discharge status: Home / Self Care | Payer: BC Managed Care – PPO | Source: Ambulatory Visit | Attending: Radiation Oncology | Admitting: Radiation Oncology

## 2021-02-19 ENCOUNTER — Encounter: Payer: Self-pay | Admitting: Radiation Oncology

## 2021-02-19 ENCOUNTER — Other Ambulatory Visit: Payer: BC Managed Care – PPO

## 2021-02-19 ENCOUNTER — Other Ambulatory Visit: Payer: Self-pay

## 2021-02-19 ENCOUNTER — Inpatient Hospital Stay: Payer: BC Managed Care – PPO | Attending: Internal Medicine

## 2021-02-19 VITALS — BP 107/74 | HR 116 | Temp 97.9°F | Resp 20 | Ht 72.0 in | Wt 231.8 lb

## 2021-02-19 DIAGNOSIS — Z5111 Encounter for antineoplastic chemotherapy: Secondary | ICD-10-CM | POA: Insufficient documentation

## 2021-02-19 DIAGNOSIS — J45909 Unspecified asthma, uncomplicated: Secondary | ICD-10-CM | POA: Diagnosis not present

## 2021-02-19 DIAGNOSIS — Z9221 Personal history of antineoplastic chemotherapy: Secondary | ICD-10-CM | POA: Diagnosis not present

## 2021-02-19 DIAGNOSIS — Z87442 Personal history of urinary calculi: Secondary | ICD-10-CM | POA: Diagnosis not present

## 2021-02-19 DIAGNOSIS — Z79899 Other long term (current) drug therapy: Secondary | ICD-10-CM | POA: Insufficient documentation

## 2021-02-19 DIAGNOSIS — R1310 Dysphagia, unspecified: Secondary | ICD-10-CM | POA: Diagnosis not present

## 2021-02-19 DIAGNOSIS — C3411 Malignant neoplasm of upper lobe, right bronchus or lung: Secondary | ICD-10-CM

## 2021-02-19 DIAGNOSIS — G4700 Insomnia, unspecified: Secondary | ICD-10-CM | POA: Diagnosis not present

## 2021-02-19 DIAGNOSIS — K5730 Diverticulosis of large intestine without perforation or abscess without bleeding: Secondary | ICD-10-CM | POA: Diagnosis not present

## 2021-02-19 DIAGNOSIS — Z5112 Encounter for antineoplastic immunotherapy: Secondary | ICD-10-CM | POA: Diagnosis not present

## 2021-02-19 DIAGNOSIS — Z7982 Long term (current) use of aspirin: Secondary | ICD-10-CM | POA: Diagnosis not present

## 2021-02-19 DIAGNOSIS — R531 Weakness: Secondary | ICD-10-CM | POA: Insufficient documentation

## 2021-02-19 DIAGNOSIS — M4726 Other spondylosis with radiculopathy, lumbar region: Secondary | ICD-10-CM | POA: Insufficient documentation

## 2021-02-19 DIAGNOSIS — K219 Gastro-esophageal reflux disease without esophagitis: Secondary | ICD-10-CM | POA: Diagnosis not present

## 2021-02-19 DIAGNOSIS — Z7984 Long term (current) use of oral hypoglycemic drugs: Secondary | ICD-10-CM | POA: Insufficient documentation

## 2021-02-19 DIAGNOSIS — R5383 Other fatigue: Secondary | ICD-10-CM | POA: Insufficient documentation

## 2021-02-19 DIAGNOSIS — E119 Type 2 diabetes mellitus without complications: Secondary | ICD-10-CM | POA: Insufficient documentation

## 2021-02-19 DIAGNOSIS — Z5189 Encounter for other specified aftercare: Secondary | ICD-10-CM | POA: Insufficient documentation

## 2021-02-19 DIAGNOSIS — E7800 Pure hypercholesterolemia, unspecified: Secondary | ICD-10-CM | POA: Insufficient documentation

## 2021-02-19 DIAGNOSIS — N200 Calculus of kidney: Secondary | ICD-10-CM | POA: Diagnosis not present

## 2021-02-19 DIAGNOSIS — Z87891 Personal history of nicotine dependence: Secondary | ICD-10-CM | POA: Diagnosis not present

## 2021-02-19 DIAGNOSIS — M5116 Intervertebral disc disorders with radiculopathy, lumbar region: Secondary | ICD-10-CM | POA: Diagnosis not present

## 2021-02-19 LAB — CBC WITH DIFFERENTIAL (CANCER CENTER ONLY)
Abs Immature Granulocytes: 0.15 10*3/uL — ABNORMAL HIGH (ref 0.00–0.07)
Basophils Absolute: 0.1 10*3/uL (ref 0.0–0.1)
Basophils Relative: 1 %
Eosinophils Absolute: 0.1 10*3/uL (ref 0.0–0.5)
Eosinophils Relative: 1 %
HCT: 32.2 % — ABNORMAL LOW (ref 39.0–52.0)
Hemoglobin: 10.8 g/dL — ABNORMAL LOW (ref 13.0–17.0)
Immature Granulocytes: 1 %
Lymphocytes Relative: 11 %
Lymphs Abs: 1.3 10*3/uL (ref 0.7–4.0)
MCH: 30.6 pg (ref 26.0–34.0)
MCHC: 33.5 g/dL (ref 30.0–36.0)
MCV: 91.2 fL (ref 80.0–100.0)
Monocytes Absolute: 1.8 10*3/uL — ABNORMAL HIGH (ref 0.1–1.0)
Monocytes Relative: 15 %
Neutro Abs: 8.5 10*3/uL — ABNORMAL HIGH (ref 1.7–7.7)
Neutrophils Relative %: 71 %
Platelet Count: 186 10*3/uL (ref 150–400)
RBC: 3.53 MIL/uL — ABNORMAL LOW (ref 4.22–5.81)
RDW: 16.2 % — ABNORMAL HIGH (ref 11.5–15.5)
WBC Count: 11.9 10*3/uL — ABNORMAL HIGH (ref 4.0–10.5)
nRBC: 0.5 % — ABNORMAL HIGH (ref 0.0–0.2)

## 2021-02-19 LAB — CMP (CANCER CENTER ONLY)
ALT: 12 U/L (ref 0–44)
AST: 14 U/L — ABNORMAL LOW (ref 15–41)
Albumin: 3.8 g/dL (ref 3.5–5.0)
Alkaline Phosphatase: 144 U/L — ABNORMAL HIGH (ref 38–126)
Anion gap: 10 (ref 5–15)
BUN: 22 mg/dL — ABNORMAL HIGH (ref 6–20)
CO2: 24 mmol/L (ref 22–32)
Calcium: 9.5 mg/dL (ref 8.9–10.3)
Chloride: 103 mmol/L (ref 98–111)
Creatinine: 1.55 mg/dL — ABNORMAL HIGH (ref 0.61–1.24)
GFR, Estimated: 51 mL/min — ABNORMAL LOW (ref >60)
Glucose, Bld: 124 mg/dL — ABNORMAL HIGH (ref 70–99)
Potassium: 3.9 mmol/L (ref 3.5–5.1)
Sodium: 137 mmol/L (ref 135–145)
Total Bilirubin: 0.5 mg/dL (ref 0.3–1.2)
Total Protein: 6.9 g/dL (ref 6.5–8.1)

## 2021-02-19 LAB — TSH: TSH: 6.441 u[IU]/mL — ABNORMAL HIGH (ref 0.320–4.118)

## 2021-02-19 NOTE — Progress Notes (Signed)
Radiation Oncology         [phone] ________________________________  Outpatient Re-Consultation  Name: William Farrell        MRN: [id number]  Date of Service: 02/19/2021 DOB: December 03, 1960  CC:William Reeve, DO  William Bears, MD     REFERRING PHYSICIAN: Curt Bears, MD   DIAGNOSIS: The encounter diagnosis was Malignant neoplasm of right upper lobe of lung ([place]).   HISTORY OF PRESENT ILLNESS: William Farrell is a 60 y.o. male with a history of Stage IIIA, cT2aN2M0, NSCLC, squamous cell carcinoma of the RUL that was diagnosed in the spring of 2021. He did complete chemoradiation under the care of Dr. Lisbeth Renshaw, and since his last visit also completed consolidative immunotherapy.  Unfortunately after 3 cycles, he developed progressive disease in starting chemo with carboplatin paclitaxel and Keytruda.  Repeat imaging showed progressive adenopathy in the chest and he began dose of Taxol and Cyramza every 3 weeks.  He has had a total of 3 cycles and underwent restaging scans on 02/09/2021 which reveals enlargement in nodules in the right lung apex that are now a confluent mass measuring 3 x 2 cm.  He had otherwise stable findings, and given the concern for progression in the chest he was offered continuation of his systemic therapy with stereotactic body radiotherapy consultation, versus changing therapy to a different systemic treatment versus second opinion at [facility] about systemic options.  He has elected to consider radiotherapy and is seen today to discuss this.    PREVIOUS RADIATION THERAPY:   03/31/20-05/15/20: The right lung target was was treated to 60 Gy in 30 fractions followed by a 6 Gy boost in 3 fractions.   PAST MEDICAL HISTORY:  Past Medical History:  Diagnosis Date  . Asthma    as a child  . Diabetes ([place])   . Diverticulitis   . GERD (gastroesophageal reflux disease)   . High cholesterol   . History of kidney stones        PAST SURGICAL HISTORY: Past  Surgical History:  Procedure Laterality Date  . broken bone repair    . BRONCHIAL BIOPSY  03/11/2020   Procedure: BRONCHIAL BIOPSIES;  Surgeon: Garner Nash, DO;  Location: [facility];  Service: Pulmonary;;  . COLONOSCOPY    . CRYOTHERAPY  03/11/2020   Procedure: CRYOTHERAPY;  Surgeon: Garner Nash, DO;  Location: [facility];  Service: Pulmonary;;  . FINE NEEDLE ASPIRATION  03/11/2020   Procedure: FINE NEEDLE ASPIRATION (FNA) LINEAR;  Surgeon: Garner Nash, DO;  Location: [facility];  Service: Pulmonary;;  . IR IMAGING GUIDED PORT INSERTION  10/16/2020  . KNEE ARTHROSCOPY WITH ANTERIOR CRUCIATE LIGAMENT (ACL) REPAIR     x 2  . VIDEO BRONCHOSCOPY WITH ENDOBRONCHIAL ULTRASOUND N/A 03/11/2020   Procedure: VIDEO BRONCHOSCOPY WITH ENDOBRONCHIAL ULTRASOUND;  Surgeon: Garner Nash, DO;  Location: [facility];  Service: Pulmonary;  Laterality: N/A;     FAMILY HISTORY:  Family History  Problem Relation Age of Onset  . High blood pressure Mother   . Stroke Mother      SOCIAL HISTORY:  reports that he quit smoking about 10 months ago. His smoking use included cigarettes. He has a 86.00 pack-year smoking history. He has never used smokeless tobacco. He reports previous alcohol use. He reports previous drug use. The patient is married and lives in [place]. 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   ALLERGIES: Sitagliptin   MEDICATIONS:  Current Outpatient Medications  Medication  Sig Dispense Refill  . acetaminophen (TYLENOL) 500 MG tablet Take 1,000 mg by mouth every 6 (six) hours as needed for mild pain.    Marland Kitchen albuterol (VENTOLIN HFA) 108 (90 Base) MCG/ACT inhaler Inhale 1-2 puffs into the lungs every 4 (four) hours as needed for wheezing or shortness of breath. 18 g 99  . aspirin EC 81 MG tablet Take 81 mg by mouth at bedtime.     Marland Kitchen atorvastatin (LIPITOR) 40 MG tablet TAKE 1 TABLET DAILY (Patient taking differently:  Take 40 mg by mouth at bedtime.) 90 tablet 1  . dexamethasone (DECADRON) 4 MG tablet 1 tablet p.o. twice daily the day before, day of and day after chemotherapy every 3 weeks. 40 tablet 0  . docusate sodium (COLACE) 100 MG capsule Take 100 mg by mouth daily.    Marland Kitchen glipiZIDE (GLUCOTROL) 5 MG tablet Take 1 tablet (5 mg total) by mouth 2 (two) times daily before a meal. 180 tablet 3  . Lancets (ONETOUCH DELICA PLUS [id number]) MISC USE AS DIRECTED UP TO FOUR TIMES A DAY 100 each 13  . levothyroxine (SYNTHROID) 50 MCG tablet TAKE 1 TABLET BY MOUTH DAILY BEFORE BREAKFAST 30 tablet 1  . lidocaine-prilocaine (EMLA) cream Apply 1 application topically as needed. 30 g 2  . meloxicam (MOBIC) 15 MG tablet TAKE 1 TABLET DAILY (Patient taking differently: Take 15 mg by mouth daily.) 90 tablet 3  . metFORMIN (GLUCOPHAGE) 1000 MG tablet TAKE 1 TABLET TWICE A DAY WITH MEALS (Patient taking differently: Take 500 mg by mouth 2 (two) times daily with a meal.) 180 tablet 3  . ondansetron (ZOFRAN) 8 MG tablet Take 1 tablet (8 mg total) by mouth every 8 (eight) hours as needed for nausea or vomiting. 20 tablet 0  . ONETOUCH ULTRA test strip USE AS INSTRUCTED UP TO FOUR TIMES A DAY 100 strip 13  . Semaglutide,0.25 or 0.5MG /DOS, (OZEMPIC, 0.25 OR 0.5 MG/DOSE,) 2 MG/1.5ML SOPN Inject 0.5 mg into the skin once a week. 7.5 mL 1  . temazepam (RESTORIL) 15 MG capsule Take 1 capsule (15 mg total) by mouth at bedtime as needed for sleep. 30 capsule 0  . trolamine salicylate (ASPERCREME) 10 % cream Apply 1 application topically 2 (two) times daily as needed for muscle pain.     . calcium carbonate (TUMS - DOSED IN MG ELEMENTAL CALCIUM) 500 MG chewable tablet Chew 2 tablets by mouth 3 (three) times daily as needed for indigestion or heartburn.  (Patient not taking: Reported on 02/19/2021)    . methylPREDNISolone (MEDROL DOSEPAK) 4 MG TBPK tablet Take per Package instructions (Patient not taking: Reported on 02/19/2021) 21 tablet 0  .  oxyCODONE-acetaminophen (PERCOCET/ROXICET) 5-325 MG tablet Take 1 tablet by mouth every 8 (eight) hours as needed for severe pain. (Patient not taking: Reported on 02/19/2021) 30 tablet 0  . prochlorperazine (COMPAZINE) 10 MG tablet TAKE 1 TABLET BY MOUTH EVERY 6 HOURS AS NEEDED FOR NAUSEA OR VOMITING. (Patient not taking: Reported on 02/19/2021) 30 tablet 2   No current facility-administered medications for this encounter.     REVIEW OF SYSTEMS: On review of systems, the patient reports that he is doing pretty well in tolerating his systemic therapy. He does note progressive feelings of being winded. He also describes dysphagia to solid more than liquids. He describes regurgitation at times and is seeing GI in a few weeks to determine if his esophagus needs to be dilated. He has lost about 15 pounds in the last month. He  denies any productive cough or hemoptysis. He does have some sensitivity and at times discomfort along the posterior chest wall that he attributes to his prior radiotherapy. He reports despite feeling winded though, his O2 levels have been normal and he doesn't actually feel short of breath. No other complaints are noted.      PHYSICAL EXAM:  Wt Readings from Last 3 Encounters:  02/19/21 231 lb 12.8 oz (105.1 kg)  02/11/21 237 lb 14.4 oz (107.9 kg)  01/22/21 242 lb 14.4 oz (110.2 kg)   Temp Readings from Last 3 Encounters:  02/19/21 97.9 F (36.6 C)  02/12/21 97.8 F (36.6 C) (Oral)  02/11/21 (!) 96.3 F (35.7 C) (Tympanic)   BP Readings from Last 3 Encounters:  02/19/21 107/74  02/12/21 114/72  02/11/21 120/87   Pulse Readings from Last 3 Encounters:  02/19/21 (!) 116  02/12/21 (!) 103  02/11/21 96   In general this is a well appearing caucasian male in no acute distress. He's alert and oriented x4 and appropriate throughout the examination. Cardiopulmonary assessment is negative for acute distress and he exhibits normal effort.    ECOG = 1  0 - Asymptomatic  (Fully active, able to carry on all predisease activities without restriction)  1 - Symptomatic but completely ambulatory (Restricted in physically strenuous activity but ambulatory and able to carry out work of a light or sedentary nature. For example, light housework, office work)  2 - Symptomatic, <50% in bed during the day (Ambulatory and capable of all self care but unable to carry out any work activities. Up and about more than 50% of waking hours)  3 - Symptomatic, >50% in bed, but not bedbound (Capable of only limited self-care, confined to bed or chair 50% or more of waking hours)  4 - Bedbound (Completely disabled. Cannot carry on any self-care. Totally confined to bed or chair)  5 - Death   Eustace Pen MM, Creech RH, Tormey DC, et al. [phone]). "Toxicity and response criteria of the [facility]". [place] Oncol. 5 (6): 649-55    LABORATORY DATA:  Lab Results  Component Value Date   WBC 11.9 (H) 02/19/2021   HGB 10.8 (L) 02/19/2021   HCT 32.2 (L) 02/19/2021   MCV 91.2 02/19/2021   PLT 186 02/19/2021   Lab Results  Component Value Date   NA 137 02/19/2021   K 3.9 02/19/2021   CL 103 02/19/2021   CO2 24 02/19/2021   Lab Results  Component Value Date   ALT 12 02/19/2021   AST 14 (L) 02/19/2021   ALKPHOS 144 (H) 02/19/2021   BILITOT 0.5 02/19/2021      RADIOGRAPHY: CT CHEST ABDOMEN PELVIS W CONTRAST  Result Date: 02/09/2021 CLINICAL DATA:  Restaging non-small cell lung cancer EXAM: CT CHEST, ABDOMEN, AND PELVIS WITH CONTRAST TECHNIQUE: Multidetector CT imaging of the chest, abdomen and pelvis was performed following the standard protocol during bolus administration of intravenous contrast. CONTRAST:  117mL OMNIPAQUE IOHEXOL 300 MG/ML  SOLN COMPARISON:  12/03/2020 FINDINGS: CT CHEST FINDINGS Cardiovascular: Left IJ Port-A-Cath tip: SVC. Mediastinum/Nodes: Right paratracheal node 1.1 cm in short axis on image 24 series 2, formerly 1.0 cm by my  measurements. Subcarinal node 1.1 cm in short axis on image 31 series 2, formerly 1.2 cm. Right lower paraesophageal lymph node 0.7 cm in short axis on image 51 series 2, formerly the same. Right subpectoral lymph node 1.4 cm in short axis on image 13 series 2, previously the same  by my measurements. Right level measures 1.6 cm in short IV lymph axis node in the lower neck on image 9 series 2, formerly the same by my measurements. Lungs/Pleura: The right suprahilar mass measures 3.6 by 2.7 cm on image 64 of series 3, previously 3.8 by 2.7 cm by my measurements. Some of the atelectasis along the margin of this lesion has resolved. There is continued peribronchovascular density in airway plugging extending cephalad, but increased size and confluence of the two apical nodules in the right lung, now forming a single nodule measuring 3.0 by 2.0 cm. Increase in size and number of apical satellite nodules for example on images 33-38 of series 3. Notable atelectasis anteriorly in the right upper lobe and posteriorly in the right upper lobe with some shift of cardiac and mediastinal structures to the right. Stable nodularity at the left lung apex probably from old granulomatous disease. Tree-in-bud nodularity medially in the anterior portion of the left upper lobe suggesting chronic atypical infectious bronchiolitis, similar to the prior exam. In addition to the localized diffusion posterior to the right hilum that was present previously, there is also now a trace dependent effusion posteriorly in the right hemithorax. Musculoskeletal: Degenerative glenohumeral arthropathy. Faint sclerosis in the right sternum is unchanged/nonprogressive. CT ABDOMEN PELVIS FINDINGS Hepatobiliary: The very subtle 6 mm hypodensity posteriorly in the right hepatic lobe on image 67 of series 2 is unchanged and nonspecific. No new or enlarging liver lesion. Gallbladder unremarkable. No biliary dilatation. Pancreas: Unremarkable Spleen: Upper  normal splenic size.  No focal splenic lesion. Adrenals/Urinary Tract: 2 mm right kidney lower pole nonobstructive renal calculus. No significant adrenal abnormality. The kidneys appear normal. Stomach/Bowel: Sigmoid colon diverticulosis. Vascular/Lymphatic: Aortoiliac atherosclerotic vascular disease. Aortocaval node 0.9 cm in short axis on image 80 series 2, stable. Reproductive: Unremarkable Other: No supplemental non-categorized findings. 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 Lumbar spondylosis and degenerative disc disease resulting in impingement at L2-3, L3-4, and L4-5. Fatty spermatic cords. IMPRESSION: 1. Previous adjacent nodules at the right lung apex have enlarged and now form a confluent nodular mass measuring 3.0 by 2.0 cm. 2. Mild increase in size of the right pleural effusion. This was previously localized in the right retro hilar region (possibly related to prior radiation therapy) but now there is also a small free-flowing component of right pleural effusion. 3. Otherwise the right suprahilar mass and regional lower neck and thoracic lymph nodes appear stable. Upper normal sized aortocaval lymph node appears stable. 4. 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 Stable subtle nonspecific 6 mm hypodensity in the right hepatic lobe. Nonobstructive right nephrolithiasis. Aortoiliac atherosclerotic vascular disease. Multilevel lumbar impingement. Electronically Signed   By: Van Clines M.D.   On: 02/09/2021 14:36       IMPRESSION/PLAN: 1.  Progressive Stage IIIA, cT2aN2M0, NSCLC, squamous cell carcinoma of the RUL. Dr. Lisbeth Renshaw discusses the pathology findings and reviews the nature of progressive disease in the chest and the utility of more focal radiotherapy with stereotactic body radiotherapy (SBRT). We discussed the risks, benefits, short, and long  term effects of radiotherapy, as well as the curative intent, and the patient is interested in proceeding. Dr. Lisbeth Renshaw discusses the delivery and logistics of radiotherapy and anticipates a course of 5 fractions of SBRT to the RUL nodule. Written consent is obtained and placed in the chart, a copy was provided to the patient. He will simulate on Monday  02/23/21 and will continue plans for his current systemic therapy with Dr. Julien Nordmann. 2. Dysphagia. We agree with seeing GI as he may need dilation of the esophagus given his prior radiotherapy which could have led to this.  3. Feelings of being winded. The location of his prior disease, stability though post treatment effects are still seen on imaging, if his symptoms do not improve with treatment, we discussed re-evaluation with pulmonary medicine. We will follow up with this expectantly.   In a visit lasting 45 minutes, greater than 50% of the time was spent face to face discussing the patient's condition, in preparation for the discussion, and coordinating the patient's care.   The above documentation reflects my direct findings during this shared patient visit. Please see the separate note by Dr. Lisbeth Renshaw on this date for the remainder of the patient's plan of care.    Carola Rhine, PAC

## 2021-02-23 ENCOUNTER — Other Ambulatory Visit: Payer: Self-pay

## 2021-02-23 ENCOUNTER — Ambulatory Visit
Admission: RE | Admit: 2021-02-23 | Discharge: 2021-02-23 | Disposition: A | Discharge status: Home / Self Care | Payer: BC Managed Care – PPO | Source: Ambulatory Visit | Attending: Radiation Oncology | Admitting: Radiation Oncology

## 2021-02-23 DIAGNOSIS — C3411 Malignant neoplasm of upper lobe, right bronchus or lung: Secondary | ICD-10-CM | POA: Insufficient documentation

## 2021-02-23 DIAGNOSIS — Z87891 Personal history of nicotine dependence: Secondary | ICD-10-CM | POA: Diagnosis not present

## 2021-02-24 ENCOUNTER — Other Ambulatory Visit: Payer: Self-pay | Admitting: Internal Medicine

## 2021-02-24 DIAGNOSIS — R112 Nausea with vomiting, unspecified: Secondary | ICD-10-CM

## 2021-02-25 ENCOUNTER — Other Ambulatory Visit: Payer: BC Managed Care – PPO

## 2021-02-25 ENCOUNTER — Other Ambulatory Visit: Payer: Self-pay | Admitting: Osteopathic Medicine

## 2021-02-26 ENCOUNTER — Other Ambulatory Visit: Payer: Self-pay

## 2021-02-26 ENCOUNTER — Inpatient Hospital Stay: Payer: BC Managed Care – PPO

## 2021-02-26 ENCOUNTER — Other Ambulatory Visit: Payer: BC Managed Care – PPO

## 2021-02-26 DIAGNOSIS — Z5111 Encounter for antineoplastic chemotherapy: Secondary | ICD-10-CM | POA: Diagnosis not present

## 2021-02-26 DIAGNOSIS — Z5112 Encounter for antineoplastic immunotherapy: Secondary | ICD-10-CM | POA: Diagnosis not present

## 2021-02-26 DIAGNOSIS — K219 Gastro-esophageal reflux disease without esophagitis: Secondary | ICD-10-CM | POA: Diagnosis not present

## 2021-02-26 DIAGNOSIS — G4700 Insomnia, unspecified: Secondary | ICD-10-CM | POA: Diagnosis not present

## 2021-02-26 DIAGNOSIS — R5383 Other fatigue: Secondary | ICD-10-CM | POA: Diagnosis not present

## 2021-02-26 DIAGNOSIS — C3411 Malignant neoplasm of upper lobe, right bronchus or lung: Secondary | ICD-10-CM

## 2021-02-26 DIAGNOSIS — E7800 Pure hypercholesterolemia, unspecified: Secondary | ICD-10-CM | POA: Diagnosis not present

## 2021-02-26 DIAGNOSIS — E119 Type 2 diabetes mellitus without complications: Secondary | ICD-10-CM | POA: Diagnosis not present

## 2021-02-26 DIAGNOSIS — R531 Weakness: Secondary | ICD-10-CM | POA: Diagnosis not present

## 2021-02-26 DIAGNOSIS — Z7982 Long term (current) use of aspirin: Secondary | ICD-10-CM | POA: Diagnosis not present

## 2021-02-26 DIAGNOSIS — Z79899 Other long term (current) drug therapy: Secondary | ICD-10-CM | POA: Diagnosis not present

## 2021-02-26 DIAGNOSIS — Z5189 Encounter for other specified aftercare: Secondary | ICD-10-CM | POA: Diagnosis not present

## 2021-02-26 LAB — CMP (CANCER CENTER ONLY)
ALT: 15 U/L (ref 0–44)
AST: 15 U/L (ref 15–41)
Albumin: 3.7 g/dL (ref 3.5–5.0)
Alkaline Phosphatase: 132 U/L — ABNORMAL HIGH (ref 38–126)
Anion gap: 9 (ref 5–15)
BUN: 12 mg/dL (ref 6–20)
CO2: 25 mmol/L (ref 22–32)
Calcium: 9.5 mg/dL (ref 8.9–10.3)
Chloride: 105 mmol/L (ref 98–111)
Creatinine: 1.28 mg/dL — ABNORMAL HIGH (ref 0.61–1.24)
GFR, Estimated: >60 mL/min (ref >60)
Glucose, Bld: 130 mg/dL — ABNORMAL HIGH (ref 70–99)
Potassium: 4.3 mmol/L (ref 3.5–5.1)
Sodium: 139 mmol/L (ref 135–145)
Total Bilirubin: 0.4 mg/dL (ref 0.3–1.2)
Total Protein: 6.7 g/dL (ref 6.5–8.1)

## 2021-02-26 LAB — CBC WITH DIFFERENTIAL (CANCER CENTER ONLY)
Abs Immature Granulocytes: 0.08 10*3/uL — ABNORMAL HIGH (ref 0.00–0.07)
Basophils Absolute: 0.1 10*3/uL (ref 0.0–0.1)
Basophils Relative: 1 %
Eosinophils Absolute: 0.2 10*3/uL (ref 0.0–0.5)
Eosinophils Relative: 1 %
HCT: 31.7 % — ABNORMAL LOW (ref 39.0–52.0)
Hemoglobin: 10.3 g/dL — ABNORMAL LOW (ref 13.0–17.0)
Immature Granulocytes: 1 %
Lymphocytes Relative: 8 %
Lymphs Abs: 1.1 10*3/uL (ref 0.7–4.0)
MCH: 30.3 pg (ref 26.0–34.0)
MCHC: 32.5 g/dL (ref 30.0–36.0)
MCV: 93.2 fL (ref 80.0–100.0)
Monocytes Absolute: 1.2 10*3/uL — ABNORMAL HIGH (ref 0.1–1.0)
Monocytes Relative: 8 %
Neutro Abs: 11.7 10*3/uL — ABNORMAL HIGH (ref 1.7–7.7)
Neutrophils Relative %: 81 %
Platelet Count: 187 10*3/uL (ref 150–400)
RBC: 3.4 MIL/uL — ABNORMAL LOW (ref 4.22–5.81)
RDW: 16.6 % — ABNORMAL HIGH (ref 11.5–15.5)
WBC Count: 14.3 10*3/uL — ABNORMAL HIGH (ref 4.0–10.5)
nRBC: 0 % (ref 0.0–0.2)

## 2021-02-28 ENCOUNTER — Other Ambulatory Visit: Payer: Self-pay | Admitting: Physician Assistant

## 2021-02-28 ENCOUNTER — Other Ambulatory Visit: Payer: Self-pay | Admitting: Internal Medicine

## 2021-02-28 DIAGNOSIS — E039 Hypothyroidism, unspecified: Secondary | ICD-10-CM

## 2021-03-02 ENCOUNTER — Ambulatory Visit: Payer: BC Managed Care – PPO | Admitting: Radiation Oncology

## 2021-03-03 ENCOUNTER — Ambulatory Visit: Payer: BC Managed Care – PPO | Admitting: Radiation Oncology

## 2021-03-04 ENCOUNTER — Other Ambulatory Visit: Payer: BC Managed Care – PPO

## 2021-03-04 ENCOUNTER — Ambulatory Visit: Payer: BC Managed Care – PPO | Admitting: Radiation Oncology

## 2021-03-04 ENCOUNTER — Ambulatory Visit: Payer: BC Managed Care – PPO

## 2021-03-04 ENCOUNTER — Ambulatory Visit: Payer: BC Managed Care – PPO | Admitting: Internal Medicine

## 2021-03-05 ENCOUNTER — Ambulatory Visit: Payer: BC Managed Care – PPO | Admitting: Radiation Oncology

## 2021-03-05 ENCOUNTER — Inpatient Hospital Stay: Payer: BC Managed Care – PPO

## 2021-03-05 ENCOUNTER — Other Ambulatory Visit: Payer: Self-pay

## 2021-03-05 ENCOUNTER — Inpatient Hospital Stay (HOSPITAL_BASED_OUTPATIENT_CLINIC_OR_DEPARTMENT_OTHER): Payer: BC Managed Care – PPO | Admitting: Internal Medicine

## 2021-03-05 VITALS — BP 131/77 | HR 104 | Temp 97.4°F | Resp 19 | Ht 72.0 in | Wt 237.0 lb

## 2021-03-05 VITALS — HR 94

## 2021-03-05 DIAGNOSIS — E7800 Pure hypercholesterolemia, unspecified: Secondary | ICD-10-CM | POA: Diagnosis not present

## 2021-03-05 DIAGNOSIS — Z95828 Presence of other vascular implants and grafts: Secondary | ICD-10-CM

## 2021-03-05 DIAGNOSIS — Z79899 Other long term (current) drug therapy: Secondary | ICD-10-CM | POA: Diagnosis not present

## 2021-03-05 DIAGNOSIS — Z7982 Long term (current) use of aspirin: Secondary | ICD-10-CM | POA: Diagnosis not present

## 2021-03-05 DIAGNOSIS — K219 Gastro-esophageal reflux disease without esophagitis: Secondary | ICD-10-CM | POA: Diagnosis not present

## 2021-03-05 DIAGNOSIS — C3411 Malignant neoplasm of upper lobe, right bronchus or lung: Secondary | ICD-10-CM

## 2021-03-05 DIAGNOSIS — Z5111 Encounter for antineoplastic chemotherapy: Secondary | ICD-10-CM | POA: Diagnosis not present

## 2021-03-05 DIAGNOSIS — Z5112 Encounter for antineoplastic immunotherapy: Secondary | ICD-10-CM | POA: Diagnosis not present

## 2021-03-05 DIAGNOSIS — R531 Weakness: Secondary | ICD-10-CM | POA: Diagnosis not present

## 2021-03-05 DIAGNOSIS — G4700 Insomnia, unspecified: Secondary | ICD-10-CM | POA: Diagnosis not present

## 2021-03-05 DIAGNOSIS — R5383 Other fatigue: Secondary | ICD-10-CM | POA: Diagnosis not present

## 2021-03-05 DIAGNOSIS — Z5189 Encounter for other specified aftercare: Secondary | ICD-10-CM | POA: Diagnosis not present

## 2021-03-05 DIAGNOSIS — E119 Type 2 diabetes mellitus without complications: Secondary | ICD-10-CM | POA: Diagnosis not present

## 2021-03-05 LAB — CMP (CANCER CENTER ONLY)
ALT: 9 U/L (ref 0–44)
AST: 10 U/L — ABNORMAL LOW (ref 15–41)
Albumin: 3.3 g/dL — ABNORMAL LOW (ref 3.5–5.0)
Alkaline Phosphatase: 113 U/L (ref 38–126)
Anion gap: 12 (ref 5–15)
BUN: 17 mg/dL (ref 6–20)
CO2: 23 mmol/L (ref 22–32)
Calcium: 9.9 mg/dL (ref 8.9–10.3)
Chloride: 107 mmol/L (ref 98–111)
Creatinine: 1.32 mg/dL — ABNORMAL HIGH (ref 0.61–1.24)
GFR, Estimated: >60 mL/min (ref >60)
Glucose, Bld: 201 mg/dL — ABNORMAL HIGH (ref 70–99)
Potassium: 4.1 mmol/L (ref 3.5–5.1)
Sodium: 142 mmol/L (ref 135–145)
Total Bilirubin: 0.4 mg/dL (ref 0.3–1.2)
Total Protein: 7.1 g/dL (ref 6.5–8.1)

## 2021-03-05 LAB — CBC WITH DIFFERENTIAL (CANCER CENTER ONLY)
Abs Immature Granulocytes: 0.08 10*3/uL — ABNORMAL HIGH (ref 0.00–0.07)
Basophils Absolute: 0 10*3/uL (ref 0.0–0.1)
Basophils Relative: 0 %
Eosinophils Absolute: 0 10*3/uL (ref 0.0–0.5)
Eosinophils Relative: 0 %
HCT: 30.4 % — ABNORMAL LOW (ref 39.0–52.0)
Hemoglobin: 9.5 g/dL — ABNORMAL LOW (ref 13.0–17.0)
Immature Granulocytes: 0 %
Lymphocytes Relative: 6 %
Lymphs Abs: 1.1 10*3/uL (ref 0.7–4.0)
MCH: 30.1 pg (ref 26.0–34.0)
MCHC: 31.3 g/dL (ref 30.0–36.0)
MCV: 96.2 fL (ref 80.0–100.0)
Monocytes Absolute: 1.5 10*3/uL — ABNORMAL HIGH (ref 0.1–1.0)
Monocytes Relative: 8 %
Neutro Abs: 15.9 10*3/uL — ABNORMAL HIGH (ref 1.7–7.7)
Neutrophils Relative %: 86 %
Platelet Count: 229 10*3/uL (ref 150–400)
RBC: 3.16 MIL/uL — ABNORMAL LOW (ref 4.22–5.81)
RDW: 16.6 % — ABNORMAL HIGH (ref 11.5–15.5)
WBC Count: 18.7 10*3/uL — ABNORMAL HIGH (ref 4.0–10.5)
nRBC: 0 % (ref 0.0–0.2)

## 2021-03-05 MED ORDER — ACETAMINOPHEN 325 MG PO TABS
650.0000 mg | ORAL_TABLET | Freq: Once | ORAL | Status: AC
  Administered 2021-03-05: 650 mg via ORAL

## 2021-03-05 MED ORDER — SODIUM CHLORIDE 0.9% FLUSH
10.0000 mL | Freq: Once | INTRAVENOUS | Status: AC
  Administered 2021-03-05: 10 mL
  Filled 2021-03-05: qty 10

## 2021-03-05 MED ORDER — DIPHENHYDRAMINE HCL 50 MG/ML IJ SOLN
INTRAMUSCULAR | Status: AC
  Filled 2021-03-05: qty 1

## 2021-03-05 MED ORDER — SODIUM CHLORIDE 0.9 % IV SOLN
Freq: Once | INTRAVENOUS | Status: AC
  Filled 2021-03-05: qty 250

## 2021-03-05 MED ORDER — SODIUM CHLORIDE 0.9 % IV SOLN
75.0000 mg/m2 | Freq: Once | INTRAVENOUS | Status: AC
  Administered 2021-03-05: 180 mg via INTRAVENOUS
  Filled 2021-03-05: qty 18

## 2021-03-05 MED ORDER — DIPHENHYDRAMINE HCL 50 MG/ML IJ SOLN
50.0000 mg | Freq: Once | INTRAMUSCULAR | Status: AC
Start: 2021-03-05 — End: 2021-03-05
  Administered 2021-03-05: 50 mg via INTRAVENOUS

## 2021-03-05 MED ORDER — HEPARIN SOD (PORK) LOCK FLUSH 100 UNIT/ML IV SOLN
500.0000 [IU] | Freq: Once | INTRAVENOUS | Status: AC | PRN
  Administered 2021-03-05: 500 [IU]
  Filled 2021-03-05: qty 5

## 2021-03-05 MED ORDER — SODIUM CHLORIDE 0.9% FLUSH
10.0000 mL | INTRAVENOUS | Status: DC | PRN
  Administered 2021-03-05: 10 mL
  Filled 2021-03-05: qty 10

## 2021-03-05 MED ORDER — ACETAMINOPHEN 325 MG PO TABS
ORAL_TABLET | ORAL | Status: AC
  Filled 2021-03-05: qty 2

## 2021-03-05 MED ORDER — ONDANSETRON HCL 4 MG/2ML IJ SOLN
8.0000 mg | Freq: Once | INTRAMUSCULAR | Status: AC
  Administered 2021-03-05: 8 mg via INTRAVENOUS

## 2021-03-05 MED ORDER — ONDANSETRON HCL 4 MG/2ML IJ SOLN
INTRAMUSCULAR | Status: AC
  Filled 2021-03-05: qty 4

## 2021-03-05 MED ORDER — SODIUM CHLORIDE 0.9 % IV SOLN
10.0000 mg/kg | Freq: Once | INTRAVENOUS | Status: AC
  Administered 2021-03-05: 1100 mg via INTRAVENOUS
  Filled 2021-03-05: qty 100

## 2021-03-05 MED ORDER — SODIUM CHLORIDE 0.9 % IV SOLN
10.0000 mg | Freq: Once | INTRAVENOUS | Status: AC
  Administered 2021-03-05: 10 mg via INTRAVENOUS
  Filled 2021-03-05: qty 10

## 2021-03-05 NOTE — Patient Instructions (Signed)
[facility] Discharge Instructions for Patients Receiving Chemotherapy  Today you received the following chemotherapy agents: Cyramza and Docetaxel.  To help prevent nausea and vomiting after your treatment, we encourage you to take your nausea medication as directed by your MD.   If you develop nausea and vomiting that is not controlled by your nausea medication, call the clinic.   BELOW ARE SYMPTOMS THAT SHOULD BE REPORTED IMMEDIATELY:  *FEVER GREATER THAN 100.5 F  *CHILLS WITH OR WITHOUT FEVER  NAUSEA AND VOMITING THAT IS NOT CONTROLLED WITH YOUR NAUSEA MEDICATION  *UNUSUAL SHORTNESS OF BREATH  *UNUSUAL BRUISING OR BLEEDING  TENDERNESS IN MOUTH AND THROAT WITH OR WITHOUT PRESENCE OF ULCERS  *URINARY PROBLEMS  *BOWEL PROBLEMS  UNUSUAL RASH Items with * indicate a potential emergency and should be followed up as soon as possible.  Feel free to call the clinic should you have any questions or concerns. The clinic phone number is [phone].  Please show the [place] at check-in to the Emergency Department and triage nurse.

## 2021-03-05 NOTE — Addendum Note (Signed)
Addended by: Ardeen Garland on: 03/05/2021 09:34 AM   Modules accepted: Orders

## 2021-03-05 NOTE — Progress Notes (Signed)
[place] [phone]   [phone]  OFFICE PROGRESS NOTE  [name], DO [address] [place] [address]  DIAGNOSIS: Recurrent/progressive non-small cell lung cancer initially diagnosed as stage IIIA(T2a, N2, M0) non-small cell lung cancer, squamous cell carcinoma diagnosed in April 2021, presented with right upper lobe/suprahilar lung mass in addition to right paratracheal and subcarinal lymphadenopathy.  Molecular Studies by [employer]: No actionable mutations  PRIOR THERAPY:  1) Concurrent chemoradiation with weekly carboplatin for AUC of 2 and paclitaxel 45 NG/M2. First dose on Mar 31, 2020. Status post 7 cycles. Last dose was given on 05/12/2020 with partial response. 2) Consolidation immunotherapy with durvalumab 1500 mg IV every 4 weeks. First dose May 12, 2020.Status post 3 cycles. 3) Systemic chemotherapy with carboplatin for an AUC of 5, paclitaxel 175 mg/m2, and Keytruda 200 mg IV every 3 weeks. First dose expected on 10/02/20.   Status post 3 cycles.  Last dose was given on November 13, 2020. 4) SBRT to the enlarging right upper lobe lung nodules under the care of Dr. Lisbeth Renshaw.  CURRENT THERAPY:  Second line systemic chemotherapy with docetaxel 75 Mg/M2 and Cyramza 10 mg/KG every 3 weeks with Neulasta support.  First dose December 11, 2020.  Status post 4 cycles.   INTERVAL HISTORY: William Farrell 60 y.o. male returns to the clinic today for follow-up visit accompanied by his wife.  The patient is feeling fine today with no concerning complaints but he had increasing fatigue and weakness as well as nausea after the last cycle of his treatment.  He started on Zofran and feeling much better.  He denied having any current chest pain but has shortness of breath with exertion with cough with no hemoptysis.  The patient denied having any significant weight loss or night sweats.  He has no headache or visual  changes.  He is expected to start the SBRT to the right upper lobe lung nodule under the care of Dr. Lisbeth Renshaw on Mar 16, 2021.  The patient is here today for evaluation before starting cycle #5 of his treatment.   MEDICAL HISTORY: Past Medical History:  Diagnosis Date  . Asthma    as a child  . Diabetes ([place])   . Diverticulitis   . GERD (gastroesophageal reflux disease)   . High cholesterol   . History of kidney stones     ALLERGIES:  is allergic to sitagliptin.  MEDICATIONS:  Current Outpatient Medications  Medication Sig Dispense Refill  . acetaminophen (TYLENOL) 500 MG tablet Take 1,000 mg by mouth every 6 (six) hours as needed for mild pain.    Marland Kitchen albuterol (VENTOLIN HFA) 108 (90 Base) MCG/ACT inhaler Inhale 1-2 puffs into the lungs every 4 (four) hours as needed for wheezing or shortness of breath. 18 g 99  . aspirin EC 81 MG tablet Take 81 mg by mouth at bedtime.     Marland Kitchen atorvastatin (LIPITOR) 40 MG tablet TAKE 1 TABLET DAILY 90 tablet 3  . calcium carbonate (TUMS - DOSED IN MG ELEMENTAL CALCIUM) 500 MG chewable tablet Chew 2 tablets by mouth 3 (three) times daily as needed for indigestion or heartburn.  (Patient not taking: Reported on 02/19/2021)    . dexamethasone (DECADRON) 4 MG tablet TAKE 1 TABLET BY MOUTH TWICE DAILY THE DAY BEFORE, DAY OF AND DAY AFTER CHEMOTHERAPY EVERY 3 WEEKS. 40 tablet 0  . docusate sodium (COLACE) 100 MG capsule Take 100 mg by mouth daily.    Marland Kitchen  glipiZIDE (GLUCOTROL) 5 MG tablet Take 1 tablet (5 mg total) by mouth 2 (two) times daily before a meal. 180 tablet 3  . Lancets (ONETOUCH DELICA PLUS [id number]) MISC USE AS DIRECTED UP TO FOUR TIMES A DAY 100 each 13  . levothyroxine (SYNTHROID) 50 MCG tablet TAKE 1 TABLET BY MOUTH EVERY DAY BEFORE BREAKFAST 30 tablet 1  . lidocaine-prilocaine (EMLA) cream Apply 1 application topically as needed. 30 g 2  . meloxicam (MOBIC) 15 MG tablet TAKE 1 TABLET DAILY (Patient taking differently: Take 15 mg by mouth daily.) 90  tablet 3  . metFORMIN (GLUCOPHAGE) 1000 MG tablet TAKE 1 TABLET TWICE A DAY WITH MEALS (Patient taking differently: Take 500 mg by mouth 2 (two) times daily with a meal.) 180 tablet 3  . methylPREDNISolone (MEDROL DOSEPAK) 4 MG TBPK tablet Take per Package instructions (Patient not taking: Reported on 02/19/2021) 21 tablet 0  . ondansetron (ZOFRAN) 8 MG tablet TAKE 1 TABLET BY MOUTH EVERY 8 HOURS AS NEEDED FOR NAUSEA OR VOMITING. 9 tablet 2  . ONETOUCH ULTRA test strip USE AS INSTRUCTED UP TO FOUR TIMES A DAY 100 strip 13  . oxyCODONE-acetaminophen (PERCOCET/ROXICET) 5-325 MG tablet Take 1 tablet by mouth every 8 (eight) hours as needed for severe pain. (Patient not taking: Reported on 02/19/2021) 30 tablet 0  . prochlorperazine (COMPAZINE) 10 MG tablet TAKE 1 TABLET BY MOUTH EVERY 6 HOURS AS NEEDED FOR NAUSEA OR VOMITING. (Patient not taking: Reported on 02/19/2021) 30 tablet 2  . Semaglutide,0.25 or 0.5MG /DOS, (OZEMPIC, 0.25 OR 0.5 MG/DOSE,) 2 MG/1.5ML SOPN Inject 0.5 mg into the skin once a week. 7.5 mL 1  . temazepam (RESTORIL) 15 MG capsule Take 1 capsule (15 mg total) by mouth at bedtime as needed for sleep. 30 capsule 0  . trolamine salicylate (ASPERCREME) 10 % cream Apply 1 application topically 2 (two) times daily as needed for muscle pain.      No current facility-administered medications for this visit.    SURGICAL HISTORY:  Past Surgical History:  Procedure Laterality Date  . broken bone repair    . BRONCHIAL BIOPSY  03/11/2020   Procedure: BRONCHIAL BIOPSIES;  Surgeon: Garner Nash, DO;  Location: [facility];  Service: Pulmonary;;  . COLONOSCOPY    . CRYOTHERAPY  03/11/2020   Procedure: CRYOTHERAPY;  Surgeon: Garner Nash, DO;  Location: [facility];  Service: Pulmonary;;  . FINE NEEDLE ASPIRATION  03/11/2020   Procedure: FINE NEEDLE ASPIRATION (FNA) LINEAR;  Surgeon: Garner Nash, DO;  Location: [facility];  Service: Pulmonary;;  . IR IMAGING GUIDED PORT INSERTION   10/16/2020  . KNEE ARTHROSCOPY WITH ANTERIOR CRUCIATE LIGAMENT (ACL) REPAIR     x 2  . VIDEO BRONCHOSCOPY WITH ENDOBRONCHIAL ULTRASOUND N/A 03/11/2020   Procedure: VIDEO BRONCHOSCOPY WITH ENDOBRONCHIAL ULTRASOUND;  Surgeon: Garner Nash, DO;  Location: [facility];  Service: Pulmonary;  Laterality: N/A;    REVIEW OF SYSTEMS:  A comprehensive review of systems was negative except for: Constitutional: positive for fatigue Respiratory: positive for cough and dyspnea on exertion Gastrointestinal: positive for nausea   PHYSICAL EXAMINATION: General appearance: alert, cooperative, fatigued and no distress Head: Normocephalic, without obvious abnormality, atraumatic Neck: no adenopathy, no JVD, supple, symmetrical, trachea midline and thyroid not enlarged, symmetric, no tenderness/mass/nodules Lymph nodes: Cervical, supraclavicular, and axillary nodes normal. Resp: clear to auscultation bilaterally Back: symmetric, no curvature. ROM normal. No CVA tenderness. Cardio: regular rate and rhythm, S1, S2 normal, no murmur, click, rub or gallop GI:  soft, non-tender; bowel sounds normal; no masses,  no organomegaly Extremities: extremities normal, atraumatic, no cyanosis or edema  ECOG PERFORMANCE STATUS: 1 - Symptomatic but completely ambulatory  Blood pressure 131/77, pulse (!) 104, temperature (!) 97.4 F (36.3 C), temperature source Tympanic, resp. rate 19, height 6' (1.829 m), weight 237 lb (107.5 kg), SpO2 99 %.  LABORATORY DATA: Lab Results  Component Value Date   WBC 18.7 (H) 03/05/2021   HGB 9.5 (L) 03/05/2021   HCT 30.4 (L) 03/05/2021   MCV 96.2 03/05/2021   PLT 229 03/05/2021      Chemistry      Component Value Date/Time   NA 139 02/26/2021 0807   K 4.3 02/26/2021 0807   CL 105 02/26/2021 0807   CO2 25 02/26/2021 0807   BUN 12 02/26/2021 0807   CREATININE 1.28 (H) 02/26/2021 0807   CREATININE 1.10 01/14/2020 0859      Component Value Date/Time   CALCIUM 9.5 02/26/2021  0807   ALKPHOS 132 (H) 02/26/2021 0807   AST 15 02/26/2021 0807   ALT 15 02/26/2021 0807   BILITOT 0.4 02/26/2021 0807       RADIOGRAPHIC STUDIES: CT CHEST ABDOMEN PELVIS W CONTRAST  Result Date: 02/09/2021 CLINICAL DATA:  Restaging non-small cell lung cancer EXAM: CT CHEST, ABDOMEN, AND PELVIS WITH CONTRAST TECHNIQUE: Multidetector CT imaging of the chest, abdomen and pelvis was performed following the standard protocol during bolus administration of intravenous contrast. CONTRAST:  139mL OMNIPAQUE IOHEXOL 300 MG/ML  SOLN COMPARISON:  12/03/2020 FINDINGS: CT CHEST FINDINGS Cardiovascular: Left IJ Port-A-Cath tip: SVC. Mediastinum/Nodes: Right paratracheal node 1.1 cm in short axis on image 24 series 2, formerly 1.0 cm by my measurements. Subcarinal node 1.1 cm in short axis on image 31 series 2, formerly 1.2 cm. Right lower paraesophageal lymph node 0.7 cm in short axis on image 51 series 2, formerly the same. Right subpectoral lymph node 1.4 cm in short axis on image 13 series 2, previously the same by my measurements. Right level measures 1.6 cm in short IV lymph axis node in the lower neck on image 9 series 2, formerly the same by my measurements. Lungs/Pleura: The right suprahilar mass measures 3.6 by 2.7 cm on image 64 of series 3, previously 3.8 by 2.7 cm by my measurements. Some of the atelectasis along the margin of this lesion has resolved. There is continued peribronchovascular density in airway plugging extending cephalad, but increased size and confluence of the two apical nodules in the right lung, now forming a single nodule measuring 3.0 by 2.0 cm. Increase in size and number of apical satellite nodules for example on images 33-38 of series 3. Notable atelectasis anteriorly in the right upper lobe and posteriorly in the right upper lobe with some shift of cardiac and mediastinal structures to the right. Stable nodularity at the left lung apex probably from old granulomatous disease.  Tree-in-bud nodularity medially in the anterior portion of the left upper lobe suggesting chronic atypical infectious bronchiolitis, similar to the prior exam. In addition to the localized diffusion posterior to the right hilum that was present previously, there is also now a trace dependent effusion posteriorly in the right hemithorax. Musculoskeletal: Degenerative glenohumeral arthropathy. Faint sclerosis in the right sternum is unchanged/nonprogressive. CT ABDOMEN PELVIS FINDINGS Hepatobiliary: The very subtle 6 mm hypodensity posteriorly in the right hepatic lobe on image 67 of series 2 is unchanged and nonspecific. No new or enlarging liver lesion. Gallbladder unremarkable. No biliary dilatation. Pancreas: Unremarkable Spleen: Upper  normal splenic size.  No focal splenic lesion. Adrenals/Urinary Tract: 2 mm right kidney lower pole nonobstructive renal calculus. No significant adrenal abnormality. The kidneys appear normal. Stomach/Bowel: Sigmoid colon diverticulosis. Vascular/Lymphatic: Aortoiliac atherosclerotic vascular disease. Aortocaval node 0.9 cm in short axis on image 80 series 2, stable. Reproductive: Unremarkable Other: No supplemental non-categorized findings. 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 Lumbar spondylosis and degenerative disc disease resulting in impingement at L2-3, L3-4, and L4-5. Fatty spermatic cords. IMPRESSION: 1. Previous adjacent nodules at the right lung apex have enlarged and now form a confluent nodular mass measuring 3.0 by 2.0 cm. 2. Mild increase in size of the right pleural effusion. This was previously localized in the right retro hilar region (possibly related to prior radiation therapy) but now there is also a small free-flowing component of right pleural effusion. 3. Otherwise the right suprahilar mass and regional lower neck and thoracic lymph nodes appear stable. Upper normal sized aortocaval lymph node appears stable. 4.  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 Stable subtle nonspecific 6 mm hypodensity in the right hepatic lobe. Nonobstructive right nephrolithiasis. Aortoiliac atherosclerotic vascular disease. Multilevel lumbar impingement. Electronically Signed   By: Van Clines M.D.   On: 02/09/2021 14:36    ASSESSMENT AND PLAN: This is a 60 years old white male was recently diagnosed with stage IIIa non-small cell lung cancer, squamous cell carcinoma in April 2021 presented with right upper lobe/suprahilar lung mass in addition to right paratracheal and subcarinal lymphadenopathy. The patient completed a course of concurrent chemoradiation with weekly carboplatin for AUC of 2 and paclitaxel 45 MG/M2.  He is status post 7 cycles.  He tolerated the previous course of his treatment well except for mild dysphagia and odynophagia. He underwent consolidation treatment with immunotherapy with Imfinzi 1500 mg IV every 4 weeks.  Status post 3 cycles.  The patient tolerated the treatment well but unfortunately he has evidence for disease progression after cycle #3. He is started first-line treatment with chemotherapy with carboplatin for AUC of 5, paclitaxel 175 mg/M2 and Keytruda 200 mg IV every 3 weeks.  Status post 3 cycles.  He has been tolerating this treatment well with no concerning adverse effects except for mild fatigue. The patient had repeat CT scan of the chest, abdomen pelvis performed recently.  I personally and independently reviewed the scan images and discussed the result and showed the images to the patient today. Unfortunately his scan showed enlarging nodules and masses in the chest with enlarging lymph nodes.  This is definitely highly suspicious for disease progression but to the progression on immunotherapy could not be completely excluded. He is currently undergoing palliative systemic chemotherapy  with second line docetaxel 75 Mg/M2 and Cyramza 10 mg/KG every 3 weeks with Neulasta support.  Status post 4 cycles.   He continues to tolerate his treatment well except for the fatigue and nausea after the last cycle of his treatment.  His nausea improved with Zofran I recommended for the patient to proceed with cycle #5 today as planned. We will see him back for follow-up visit in 3 weeks for evaluation before the next cycle of his treatment. For the diabetes mellitus he is currently on Ozempic and he will follow-up with his primary care physician for regulation of his medication. For the insomnia, he will continue on Restoril 15 mg p.o. nightly as needed. He was advised to call immediately if he has  any concerning symptoms in the interval. The patient voices understanding of current disease status and treatment options and is in agreement with the current care plan. All questions were answered. The patient knows to call the clinic with any problems, questions or concerns. We can certainly see the patient much sooner if necessary.  Disclaimer: This note was dictated with voice recognition software. Similar sounding words can inadvertently be transcribed and may not be corrected upon review.

## 2021-03-06 ENCOUNTER — Inpatient Hospital Stay: Payer: BC Managed Care – PPO

## 2021-03-06 ENCOUNTER — Ambulatory Visit: Payer: BC Managed Care – PPO | Admitting: Radiation Oncology

## 2021-03-06 ENCOUNTER — Ambulatory Visit (INDEPENDENT_AMBULATORY_CARE_PROVIDER_SITE_OTHER): Payer: BC Managed Care – PPO | Admitting: Gastroenterology

## 2021-03-06 ENCOUNTER — Encounter: Payer: Self-pay | Admitting: Internal Medicine

## 2021-03-06 ENCOUNTER — Encounter: Payer: Self-pay | Admitting: Gastroenterology

## 2021-03-06 VITALS — BP 120/68 | HR 80 | Temp 98.4°F

## 2021-03-06 VITALS — BP 110/64 | HR 114 | Ht 72.0 in | Wt 238.2 lb

## 2021-03-06 DIAGNOSIS — R1310 Dysphagia, unspecified: Secondary | ICD-10-CM | POA: Diagnosis not present

## 2021-03-06 DIAGNOSIS — E7800 Pure hypercholesterolemia, unspecified: Secondary | ICD-10-CM | POA: Diagnosis not present

## 2021-03-06 DIAGNOSIS — E119 Type 2 diabetes mellitus without complications: Secondary | ICD-10-CM | POA: Diagnosis not present

## 2021-03-06 DIAGNOSIS — Z5112 Encounter for antineoplastic immunotherapy: Secondary | ICD-10-CM | POA: Diagnosis not present

## 2021-03-06 DIAGNOSIS — K219 Gastro-esophageal reflux disease without esophagitis: Secondary | ICD-10-CM | POA: Diagnosis not present

## 2021-03-06 DIAGNOSIS — C3411 Malignant neoplasm of upper lobe, right bronchus or lung: Secondary | ICD-10-CM | POA: Diagnosis not present

## 2021-03-06 DIAGNOSIS — R5383 Other fatigue: Secondary | ICD-10-CM | POA: Diagnosis not present

## 2021-03-06 DIAGNOSIS — Z5111 Encounter for antineoplastic chemotherapy: Secondary | ICD-10-CM | POA: Diagnosis not present

## 2021-03-06 DIAGNOSIS — Z923 Personal history of irradiation: Secondary | ICD-10-CM

## 2021-03-06 DIAGNOSIS — Z7982 Long term (current) use of aspirin: Secondary | ICD-10-CM | POA: Diagnosis not present

## 2021-03-06 DIAGNOSIS — R1110 Vomiting, unspecified: Secondary | ICD-10-CM

## 2021-03-06 DIAGNOSIS — G4700 Insomnia, unspecified: Secondary | ICD-10-CM | POA: Diagnosis not present

## 2021-03-06 DIAGNOSIS — Z5189 Encounter for other specified aftercare: Secondary | ICD-10-CM | POA: Diagnosis not present

## 2021-03-06 DIAGNOSIS — R531 Weakness: Secondary | ICD-10-CM | POA: Diagnosis not present

## 2021-03-06 DIAGNOSIS — Z79899 Other long term (current) drug therapy: Secondary | ICD-10-CM | POA: Diagnosis not present

## 2021-03-06 MED ORDER — PEGFILGRASTIM-CBQV 6 MG/0.6ML ~~LOC~~ SOSY
PREFILLED_SYRINGE | SUBCUTANEOUS | Status: AC
  Filled 2021-03-06: qty 0.6

## 2021-03-06 MED ORDER — PEGFILGRASTIM-CBQV 6 MG/0.6ML ~~LOC~~ SOSY
6.0000 mg | PREFILLED_SYRINGE | Freq: Once | SUBCUTANEOUS | Status: AC
  Administered 2021-03-06: 6 mg via SUBCUTANEOUS

## 2021-03-06 NOTE — Progress Notes (Signed)
2 RNs Verified : Drug appearance and integrity are intact; Drug expiration date and time- drug has not expired; Pump settings are accurate, if applicable  

## 2021-03-06 NOTE — Patient Instructions (Signed)
Pegfilgrastim injection What is this medicine? PEGFILGRASTIM (PEG fil gra stim) is a long-acting granulocyte colony-stimulating factor that stimulates the growth of neutrophils, a type of white blood cell important in the body's fight against infection. It is used to reduce the incidence of fever and infection in patients with certain types of cancer who are receiving chemotherapy that affects the bone marrow, and to increase survival after being exposed to high doses of radiation. This medicine may be used for other purposes; ask your health care provider or pharmacist if you have questions. COMMON BRAND NAME(S): Fulphila, Neulasta, Nyvepria, UDENYCA, Ziextenzo What should I tell my health care provider before I take this medicine? They need to know if you have any of these conditions:  kidney disease  latex allergy  ongoing radiation therapy  sickle cell disease  skin reactions to acrylic adhesives (On-Body Injector only)  an unusual or allergic reaction to pegfilgrastim, filgrastim, other medicines, foods, dyes, or preservatives  pregnant or trying to get pregnant  breast-feeding How should I use this medicine? This medicine is for injection under the skin. If you get this medicine at home, you will be taught how to prepare and give the pre-filled syringe or how to use the On-body Injector. Refer to the patient Instructions for Use for detailed instructions. Use exactly as directed. Tell your healthcare provider immediately if you suspect that the On-body Injector may not have performed as intended or if you suspect the use of the On-body Injector resulted in a missed or partial dose. It is important that you put your used needles and syringes in a special sharps container. Do not put them in a trash can. If you do not have a sharps container, call your pharmacist or healthcare provider to get one. Talk to your pediatrician regarding the use of this medicine in children. While this drug  may be prescribed for selected conditions, precautions do apply. Overdosage: If you think you have taken too much of this medicine contact a poison control center or emergency room at once. NOTE: This medicine is only for you. Do not share this medicine with others. What if I miss a dose? It is important not to miss your dose. Call your doctor or health care professional if you miss your dose. If you miss a dose due to an On-body Injector failure or leakage, a new dose should be administered as soon as possible using a single prefilled syringe for manual use. What may interact with this medicine? Interactions have not been studied. This list may not describe all possible interactions. Give your health care provider a list of all the medicines, herbs, non-prescription drugs, or dietary supplements you use. Also tell them if you smoke, drink alcohol, or use illegal drugs. Some items may interact with your medicine. What should I watch for while using this medicine? Your condition will be monitored carefully while you are receiving this medicine. You may need blood work done while you are taking this medicine. Talk to your health care provider about your risk of cancer. You may be more at risk for certain types of cancer if you take this medicine. If you are going to need a MRI, CT scan, or other procedure, tell your doctor that you are using this medicine (On-Body Injector only). What side effects may I notice from receiving this medicine? Side effects that you should report to your doctor or health care professional as soon as possible:  allergic reactions (skin rash, itching or hives, swelling of   the face, lips, or tongue)  back pain  dizziness  fever  pain, redness, or irritation at site where injected  pinpoint red spots on the skin  red or dark-brown urine  shortness of breath or breathing problems  stomach or side pain, or pain at the shoulder  swelling  tiredness  trouble  passing urine or change in the amount of urine  unusual bruising or bleeding Side effects that usually do not require medical attention (report to your doctor or health care professional if they continue or are bothersome):  bone pain  muscle pain This list may not describe all possible side effects. Call your doctor for medical advice about side effects. You may report side effects to FDA at 1-800-FDA-1088. Where should I keep my medicine? Keep out of the reach of children. If you are using this medicine at home, you will be instructed on how to store it. Throw away any unused medicine after the expiration date on the label. NOTE: This sheet is a summary. It may not cover all possible information. If you have questions about this medicine, talk to your doctor, pharmacist, or health care provider.  2021 [employer] Standard (2019-11-23 13:20:51)  

## 2021-03-06 NOTE — H&P (View-Only) (Signed)
Chief Complaint: Dysphagia, regurgitation   Referring Provider:     Shona Simpson, PA-C   HPI:     William Farrell is a 60 y.o. male with a history of stage III non-small cell lung cancer, squamous cell carcinoma diagnosed 02/2020 treated with chemoradiation and currently systemic treatment and radiotherapy, HLD, diabetes, nephrolithiasis, referred to the Gastroenterology Clinic for evaluation of dysphagia and regurgitation.  Was recently seen by Worthy Flank, PA-C and Dr. Lisbeth Renshaw in the Radiation Oncology clinic on 02/18/2021 due to progressive disease despite consolidative immunotherapy with plan for stereotactic body radiotherapy and continued systemic therapy with Dr. Earlie Server.  Was seen in follow-up by Dr. Earlie Server yesterday.  Recent repeat imaging on 02/01/2021 unfortunately shows enlarging nodules and masses in the chest with enlarging lymph nodes, highly suspicious for disease progression.  Currently undergoing palliative systemic chemotherapy with second line docetaxel 75 Mg/M2 and Cyramza 10 mg/KG every 3 weeks with Neulasta support.  He does have nausea which improves with Zofran.  For his dysphagia, points to his lower sternal border as site of food and pills getting stuck. Will have occasional post prandial regurgiation and coughing, and can regurgitate food out.  No history of food impactions.  Otherwise, weight has been stable and otherwise tolerating PO intake.  Does have SOB and fatigue.   Endoscopic History: Colonoscopy years ago- normal per patient. Does not want to go through repeat colonoscopy.  EGD years ago at that time- reflux esophagitis per patient, treated with Prevacid   Past Medical History:  Diagnosis Date  . Asthma    as a child  . Diabetes ([place])   . Diverticulitis   . GERD (gastroesophageal reflux disease)   . High cholesterol   . History of kidney stones   . Lung cancer [facility])      Past Surgical History:  Procedure Laterality  Date  . broken bone repair    . BRONCHIAL BIOPSY  03/11/2020   Procedure: BRONCHIAL BIOPSIES;  Surgeon: Garner Nash, DO;  Location: [facility];  Service: Pulmonary;;  . COLONOSCOPY     around [address]. [place]  03/11/2020   Procedure: CRYOTHERAPY;  Surgeon: Garner Nash, DO;  Location: [facility];  Service: Pulmonary;;  . FINE NEEDLE ASPIRATION  03/11/2020   Procedure: FINE NEEDLE ASPIRATION (FNA) LINEAR;  Surgeon: Garner Nash, DO;  Location: [facility];  Service: Pulmonary;;  . IR IMAGING GUIDED PORT INSERTION  10/16/2020  . KNEE ARTHROSCOPY WITH ANTERIOR CRUCIATE LIGAMENT (ACL) REPAIR     x 2  . VIDEO BRONCHOSCOPY WITH ENDOBRONCHIAL ULTRASOUND N/A 03/11/2020   Procedure: VIDEO BRONCHOSCOPY WITH ENDOBRONCHIAL ULTRASOUND;  Surgeon: Garner Nash, DO;  Location: [facility];  Service: Pulmonary;  Laterality: N/A;   Family History  Problem Relation Age of Onset  . High blood pressure Mother   . Stroke Mother   . Colon cancer Neg Hx   . Esophageal cancer Neg Hx    Social History   Tobacco Use  . Smoking status: Former Smoker    Packs/day: 2.00    Years: 43.00    Pack years: 86.00    Types: Cigarettes    Quit date: 04/15/2020    Years since quitting: 0.8  . Smokeless tobacco: Never Used  Vaping Use  . Vaping Use: Never used  Substance Use Topics  . Alcohol use: Not Currently  . Drug use: Not Currently   Current Outpatient  Medications  Medication Sig Dispense Refill  . acetaminophen (TYLENOL) 500 MG tablet Take 1,000 mg by mouth every 6 (six) hours as needed for mild pain.    Marland Kitchen albuterol (VENTOLIN HFA) 108 (90 Base) MCG/ACT inhaler Inhale 1-2 puffs into the lungs every 4 (four) hours as needed for wheezing or shortness of breath. 18 g 99  . aspirin EC 81 MG tablet Take 81 mg by mouth at bedtime.     Marland Kitchen atorvastatin (LIPITOR) 40 MG tablet TAKE 1 TABLET DAILY 90 tablet 3  . calcium carbonate (TUMS - DOSED IN MG ELEMENTAL CALCIUM) 500 MG chewable  tablet Chew 2 tablets by mouth 3 (three) times daily as needed for indigestion or heartburn.    . dexamethasone (DECADRON) 4 MG tablet TAKE 1 TABLET BY MOUTH TWICE DAILY THE DAY BEFORE, DAY OF AND DAY AFTER CHEMOTHERAPY EVERY 3 WEEKS. 40 tablet 0  . docusate sodium (COLACE) 100 MG capsule Take 100 mg by mouth daily.    Marland Kitchen glipiZIDE (GLUCOTROL) 5 MG tablet Take 1 tablet (5 mg total) by mouth 2 (two) times daily before a meal. 180 tablet 3  . Lancets (ONETOUCH DELICA PLUS [id number]) MISC USE AS DIRECTED UP TO FOUR TIMES A DAY 100 each 13  . levothyroxine (SYNTHROID) 50 MCG tablet TAKE 1 TABLET BY MOUTH EVERY DAY BEFORE BREAKFAST 30 tablet 1  . lidocaine-prilocaine (EMLA) cream Apply 1 application topically as needed. 30 g 2  . meloxicam (MOBIC) 15 MG tablet TAKE 1 TABLET DAILY (Patient taking differently: Take 15 mg by mouth daily.) 90 tablet 3  . metFORMIN (GLUCOPHAGE) 1000 MG tablet TAKE 1 TABLET TWICE A DAY WITH MEALS (Patient taking differently: Take 500 mg by mouth 2 (two) times daily with a meal.) 180 tablet 3  . methylPREDNISolone (MEDROL DOSEPAK) 4 MG TBPK tablet Take per Package instructions 21 tablet 0  . ondansetron (ZOFRAN) 8 MG tablet TAKE 1 TABLET BY MOUTH EVERY 8 HOURS AS NEEDED FOR NAUSEA OR VOMITING. 9 tablet 2  . ONETOUCH ULTRA test strip USE AS INSTRUCTED UP TO FOUR TIMES A DAY 100 strip 13  . oxyCODONE-acetaminophen (PERCOCET/ROXICET) 5-325 MG tablet Take 1 tablet by mouth every 8 (eight) hours as needed for severe pain. 30 tablet 0  . prochlorperazine (COMPAZINE) 10 MG tablet TAKE 1 TABLET BY MOUTH EVERY 6 HOURS AS NEEDED FOR NAUSEA OR VOMITING. 30 tablet 2  . Semaglutide,0.25 or 0.5MG /DOS, (OZEMPIC, 0.25 OR 0.5 MG/DOSE,) 2 MG/1.5ML SOPN Inject 0.5 mg into the skin once a week. 7.5 mL 1  . temazepam (RESTORIL) 15 MG capsule Take 1 capsule (15 mg total) by mouth at bedtime as needed for sleep. 30 capsule 0  . trolamine salicylate (ASPERCREME) 10 % cream Apply 1 application topically  2 (two) times daily as needed for muscle pain.      No current facility-administered medications for this visit.   Allergies  Allergen Reactions  . Sitagliptin Other (See Comments)    headache     Review of Systems: All systems reviewed and negative except where noted in HPI.     Physical Exam:    Wt Readings from Last 3 Encounters:  03/06/21 238 lb 4 oz (108.1 kg)  03/05/21 237 lb (107.5 kg)  02/19/21 231 lb 12.8 oz (105.1 kg)    BP 110/64   Pulse (!) 114   Ht 6' (1.829 m)   Wt 238 lb 4 oz (108.1 kg)   BMI 32.31 kg/m  Constitutional:  Pleasant, in no acute distress.  Psychiatric: Normal mood and affect. Behavior is normal. EENT: Pupils normal.  Conjunctivae are normal. No scleral icterus. Neck supple. No cervical LAD. Cardiovascular: Normal rate, regular rhythm. No edema Pulmonary/chest: Effort normal and breath sounds normal. No wheezing, rales or rhonchi. Abdominal: Soft, nondistended, nontender. Bowel sounds active throughout. There are no masses palpable. No hepatomegaly. Neurological: Alert and oriented to person place and time. Skin: Skin is warm and dry. No rashes noted.   ASSESSMENT AND PLAN;   1) Dysphagia 2) Lung cancer 3) History of chest radiation 4) Regurgitation  Symptomatology highly suspicious for radiation stricture.  Also with an element of regurgitation that can either be from reflux or from high-grade stricture.  - Plan for EGD with esophageal dilation at [facility] due to elevated periprocedural risks.  Discussed possibility for biopsies, Kenalog injection as well - Cut food into small pieces, chew food thoroughly, drink plenty of liquids with meals -Will message Radiation Oncology team regarding EGD with dilation scheduled to be done on same day as radiation therapy  The indications, risks, and benefits of EGD were explained to the patient in detail. Risks include but are not limited to bleeding, perforation, adverse reaction to  medications, and cardiopulmonary compromise. Sequelae include but are not limited to the possibility of surgery, hospitalization, and mortality. The patient verbalized understanding and wished to proceed. All questions answered, referred to scheduler. Further recommendations pending results of the exam.     Lavena Bullion, DO, FACG  03/06/2021, 3:45 PM   [name], DO

## 2021-03-06 NOTE — Progress Notes (Signed)
Chief Complaint: Dysphagia, regurgitation   Referring Provider:     Shona Simpson, PA-C   HPI:     William Farrell is a 60 y.o. male with a history of stage III non-small cell lung cancer, squamous cell carcinoma diagnosed 02/2020 treated with chemoradiation and currently systemic treatment and radiotherapy, HLD, diabetes, nephrolithiasis, referred to the Gastroenterology Clinic for evaluation of dysphagia and regurgitation.  Was recently seen by Worthy Flank, PA-C and Dr. Lisbeth Renshaw in the Radiation Oncology clinic on 02/18/2021 due to progressive disease despite consolidative immunotherapy with plan for stereotactic body radiotherapy and continued systemic therapy with Dr. Earlie Server.  Was seen in follow-up by Dr. Earlie Server yesterday.  Recent repeat imaging on 02/01/2021 unfortunately shows enlarging nodules and masses in the chest with enlarging lymph nodes, highly suspicious for disease progression.  Currently undergoing palliative systemic chemotherapy with second line docetaxel 75 Mg/M2 and Cyramza 10 mg/KG every 3 weeks with Neulasta support.  He does have nausea which improves with Zofran.  For his dysphagia, points to his lower sternal border as site of food and pills getting stuck. Will have occasional post prandial regurgiation and coughing, and can regurgitate food out.  No history of food impactions.  Otherwise, weight has been stable and otherwise tolerating PO intake.  Does have SOB and fatigue.   Endoscopic History: Colonoscopy years ago- normal per patient. Does not want to go through repeat colonoscopy.  EGD years ago at that time- reflux esophagitis per patient, treated with Prevacid   Past Medical History:  Diagnosis Date  . Asthma    as a child  . Diabetes ([place])   . Diverticulitis   . GERD (gastroesophageal reflux disease)   . High cholesterol   . History of kidney stones   . Lung cancer [facility])      Past Surgical History:  Procedure Laterality  Date  . broken bone repair    . BRONCHIAL BIOPSY  03/11/2020   Procedure: BRONCHIAL BIOPSIES;  Surgeon: Garner Nash, DO;  Location: [facility];  Service: Pulmonary;;  . COLONOSCOPY     around 30. [place]  03/11/2020   Procedure: CRYOTHERAPY;  Surgeon: Garner Nash, DO;  Location: [place];  Service: Pulmonary;;  . FINE NEEDLE ASPIRATION  03/11/2020   Procedure: FINE NEEDLE ASPIRATION (FNA) LINEAR;  Surgeon: Garner Nash, DO;  Location: [place] [facility];  Service: Pulmonary;;  . IR IMAGING GUIDED PORT INSERTION  10/16/2020  . KNEE ARTHROSCOPY WITH ANTERIOR CRUCIATE LIGAMENT (ACL) REPAIR     x 2  . VIDEO BRONCHOSCOPY WITH ENDOBRONCHIAL ULTRASOUND N/A 03/11/2020   Procedure: VIDEO BRONCHOSCOPY WITH ENDOBRONCHIAL ULTRASOUND;  Surgeon: Garner Nash, DO;  Location: [place];  Service: Pulmonary;  Laterality: N/A;   Family History  Problem Relation Age of Onset  . High blood pressure Mother   . Stroke Mother   . Colon cancer Neg Hx   . Esophageal cancer Neg Hx    Social History   Tobacco Use  . Smoking status: Former Smoker    Packs/day: 2.00    Years: 43.00    Pack years: 86.00    Types: Cigarettes    Quit date: 04/15/2020    Years since quitting: 0.8  . Smokeless tobacco: Never Used  Vaping Use  . Vaping Use: Never used  Substance Use Topics  . Alcohol use: Not Currently  . Drug use: Not Currently   Current Outpatient  Medications  Medication Sig Dispense Refill  . acetaminophen (TYLENOL) 500 MG tablet Take 1,000 mg by mouth every 6 (six) hours as needed for mild pain.    Marland Kitchen albuterol (VENTOLIN HFA) 108 (90 Base) MCG/ACT inhaler Inhale 1-2 puffs into the lungs every 4 (four) hours as needed for wheezing or shortness of breath. 18 g 99  . aspirin EC 81 MG tablet Take 81 mg by mouth at bedtime.     Marland Kitchen atorvastatin (LIPITOR) 40 MG tablet TAKE 1 TABLET DAILY 90 tablet 3  . calcium carbonate (TUMS - DOSED IN MG ELEMENTAL CALCIUM) 500 MG chewable  tablet Chew 2 tablets by mouth 3 (three) times daily as needed for indigestion or heartburn.    . dexamethasone (DECADRON) 4 MG tablet TAKE 1 TABLET BY MOUTH TWICE DAILY THE DAY BEFORE, DAY OF AND DAY AFTER CHEMOTHERAPY EVERY 3 WEEKS. 40 tablet 0  . docusate sodium (COLACE) 100 MG capsule Take 100 mg by mouth daily.    Marland Kitchen glipiZIDE (GLUCOTROL) 5 MG tablet Take 1 tablet (5 mg total) by mouth 2 (two) times daily before a meal. 180 tablet 3  . Lancets (ONETOUCH DELICA PLUS [id number]) MISC USE AS DIRECTED UP TO FOUR TIMES A DAY 100 each 13  . levothyroxine (SYNTHROID) 50 MCG tablet TAKE 1 TABLET BY MOUTH EVERY DAY BEFORE BREAKFAST 30 tablet 1  . lidocaine-prilocaine (EMLA) cream Apply 1 application topically as needed. 30 g 2  . meloxicam (MOBIC) 15 MG tablet TAKE 1 TABLET DAILY (Patient taking differently: Take 15 mg by mouth daily.) 90 tablet 3  . metFORMIN (GLUCOPHAGE) 1000 MG tablet TAKE 1 TABLET TWICE A DAY WITH MEALS (Patient taking differently: Take 500 mg by mouth 2 (two) times daily with a meal.) 180 tablet 3  . methylPREDNISolone (MEDROL DOSEPAK) 4 MG TBPK tablet Take per Package instructions 21 tablet 0  . ondansetron (ZOFRAN) 8 MG tablet TAKE 1 TABLET BY MOUTH EVERY 8 HOURS AS NEEDED FOR NAUSEA OR VOMITING. 9 tablet 2  . ONETOUCH ULTRA test strip USE AS INSTRUCTED UP TO FOUR TIMES A DAY 100 strip 13  . oxyCODONE-acetaminophen (PERCOCET/ROXICET) 5-325 MG tablet Take 1 tablet by mouth every 8 (eight) hours as needed for severe pain. 30 tablet 0  . prochlorperazine (COMPAZINE) 10 MG tablet TAKE 1 TABLET BY MOUTH EVERY 6 HOURS AS NEEDED FOR NAUSEA OR VOMITING. 30 tablet 2  . Semaglutide,0.25 or 0.5MG /DOS, (OZEMPIC, 0.25 OR 0.5 MG/DOSE,) 2 MG/1.5ML SOPN Inject 0.5 mg into the skin once a week. 7.5 mL 1  . temazepam (RESTORIL) 15 MG capsule Take 1 capsule (15 mg total) by mouth at bedtime as needed for sleep. 30 capsule 0  . trolamine salicylate (ASPERCREME) 10 % cream Apply 1 application topically  2 (two) times daily as needed for muscle pain.      No current facility-administered medications for this visit.   Allergies  Allergen Reactions  . Sitagliptin Other (See Comments)    headache     Review of Systems: All systems reviewed and negative except where noted in HPI.     Physical Exam:    Wt Readings from Last 3 Encounters:  03/06/21 238 lb 4 oz (108.1 kg)  03/05/21 237 lb (107.5 kg)  02/19/21 231 lb 12.8 oz (105.1 kg)    BP 110/64   Pulse (!) 114   Ht 6' (1.829 m)   Wt 238 lb 4 oz (108.1 kg)   BMI 32.31 kg/m  Constitutional:  Pleasant, in no acute distress.  Psychiatric: Normal mood and affect. Behavior is normal. EENT: Pupils normal.  Conjunctivae are normal. No scleral icterus. Neck supple. No cervical LAD. Cardiovascular: Normal rate, regular rhythm. No edema Pulmonary/chest: Effort normal and breath sounds normal. No wheezing, rales or rhonchi. Abdominal: Soft, nondistended, nontender. Bowel sounds active throughout. There are no masses palpable. No hepatomegaly. Neurological: Alert and oriented to person place and time. Skin: Skin is warm and dry. No rashes noted.   ASSESSMENT AND PLAN;   1) Dysphagia 2) Lung cancer 3) History of chest radiation 4) Regurgitation  Symptomatology highly suspicious for radiation stricture.  Also with an element of regurgitation that can either be from reflux or from high-grade stricture.  - Plan for EGD with esophageal dilation at [facility] due to elevated periprocedural risks.  Discussed possibility for biopsies, Kenalog injection as well - Cut food into small pieces, chew food thoroughly, drink plenty of liquids with meals -Will message Radiation Oncology team regarding EGD with dilation scheduled to be done on same day as radiation therapy  The indications, risks, and benefits of EGD were explained to the patient in detail. Risks include but are not limited to bleeding, perforation, adverse reaction to  medications, and cardiopulmonary compromise. Sequelae include but are not limited to the possibility of surgery, hospitalization, and mortality. The patient verbalized understanding and wished to proceed. All questions answered, referred to scheduler. Further recommendations pending results of the exam.     Lavena Bullion, DO, FACG  03/06/2021, 3:45 PM   [name], DO

## 2021-03-06 NOTE — Patient Instructions (Signed)
If you are age 61 or older, your body mass index should be between 23-30. Your Body mass index is 32.31 kg/m. If this is out of the aforementioned range listed, please consider follow up with your Primary Care Provider.  If you are age 70 or younger, your body mass index should be between 19-25. Your Body mass index is 32.31 kg/m. If this is out of the aformentioned range listed, please consider follow up with your Primary Care Provider.   You will receive a call from [place] regarding your procedure scheduled on 03/25/2021.  Due to recent changes in healthcare laws, you may see the results of your imaging and laboratory studies on MyChart before your provider has had a chance to review them.  We understand that in some cases there may be results that are confusing or concerning to you. Not all laboratory results come back in the same time frame and the provider may be waiting for multiple results in order to interpret others.  Please give [place] 48 hours in order for your provider to thoroughly review all the results before contacting the office for clarification of your results.   Thank you for choosing me and [facility].  Vito Cirigliano, D.O.

## 2021-03-07 ENCOUNTER — Inpatient Hospital Stay: Payer: BC Managed Care – PPO

## 2021-03-09 ENCOUNTER — Other Ambulatory Visit: Payer: Self-pay | Admitting: Internal Medicine

## 2021-03-09 ENCOUNTER — Ambulatory Visit: Payer: BC Managed Care – PPO | Admitting: Radiation Oncology

## 2021-03-09 DIAGNOSIS — R112 Nausea with vomiting, unspecified: Secondary | ICD-10-CM

## 2021-03-10 ENCOUNTER — Ambulatory Visit: Payer: BC Managed Care – PPO | Admitting: Osteopathic Medicine

## 2021-03-10 ENCOUNTER — Encounter: Payer: Self-pay | Admitting: Gastroenterology

## 2021-03-10 ENCOUNTER — Ambulatory Visit: Payer: BC Managed Care – PPO | Admitting: Radiation Oncology

## 2021-03-11 ENCOUNTER — Ambulatory Visit: Payer: BC Managed Care – PPO | Admitting: Osteopathic Medicine

## 2021-03-11 ENCOUNTER — Ambulatory Visit: Payer: BC Managed Care – PPO | Admitting: Radiation Oncology

## 2021-03-11 ENCOUNTER — Encounter: Payer: Self-pay | Admitting: Osteopathic Medicine

## 2021-03-11 ENCOUNTER — Other Ambulatory Visit: Payer: Self-pay

## 2021-03-11 VITALS — BP 120/71 | HR 118 | Temp 98.8°F | Wt 235.0 lb

## 2021-03-11 DIAGNOSIS — E119 Type 2 diabetes mellitus without complications: Secondary | ICD-10-CM | POA: Diagnosis not present

## 2021-03-11 DIAGNOSIS — R0602 Shortness of breath: Secondary | ICD-10-CM

## 2021-03-11 DIAGNOSIS — C3411 Malignant neoplasm of upper lobe, right bronchus or lung: Secondary | ICD-10-CM | POA: Diagnosis not present

## 2021-03-11 LAB — POCT GLYCOSYLATED HEMOGLOBIN (HGB A1C): Hemoglobin A1C: 7.5 % — AB (ref 4.0–5.6)

## 2021-03-11 NOTE — Progress Notes (Signed)
William Farrell is a 60 y.o. male who presents to  [place] at [facility]  today, 03/11/21, seeking care for the following:  DM2 follow-up, still having hyperglycemia w/ cancer treatments (steroids)    DM2 follow-up -there have been concerns about hyperglycemia likely as a side effect of cancer treatments including steroids. Patient has also noted some weight gain, probably some room for improvement in terms of diet/activity. Pt has declined insulin coverage. He is maxed on glipizide and metformin, last visit 11/2020 we added Ozempic. Plan from Onc is indefinite chemo, so steroid treatment isn't going away anytime soon  A1C last year 7.1 --> 6 mos ago was 8.7 --> 3 mos ago was 9.3 --> today 7.5   Wt Readings from Last 3 Encounters:  03/11/21 235 lb 0.6 oz (106.6 kg)  03/06/21 238 lb 4 oz (108.1 kg)  03/05/21 237 lb (107.5 kg)   BP Readings from Last 3 Encounters:  03/11/21 120/71  03/06/21 110/64  03/06/21 120/68       ASSESSMENT & PLAN with other pertinent findings:  The primary encounter diagnosis was Type 2 diabetes mellitus without complication, without long-term current use of insulin ([place]). Diagnoses of Short of breath on exertion and Malignant neoplasm of right upper lobe of lung ([place]) were also pertinent to this visit.   Lung exam c/w COPD vs atelectasis/effusion, recent CT 02/09/21 reviewed (+)effusion. Advised f/u w/ onc on this, if increased symptoms may need thoracentesis - will forward today's note to Onc  no LE edema, no CP  Not using albuterol, would try that Advised spacer w/ inhaler  Getting EGD and probably dilation esophagus in next few weeks RTC in 3 mos DM2 routin or ASAP if breathing worse   There are no Patient Instructions on file for this visit.  Orders Placed This Encounter  Procedures  . POCT HgB A1C    No orders of the defined types were placed in this encounter.    See below for relevant  physical exam findings  See below for recent lab and imaging results reviewed  Medications, allergies, PMH, PSH, SocH, FamH reviewed below    Follow-up instructions: Return in about 3 months (around 06/10/2021) for MONITOR A1C, SEE [place] SOONER IF NEEDED.                                        Exam:  BP 120/71 (BP Location: Left Arm, Patient Position: Sitting, Cuff Size: Normal)   Pulse (!) 118   Temp 98.8 F (37.1 C) (Oral)   Wt 235 lb 0.6 oz (106.6 kg)   BMI 31.88 kg/m   Constitutional: VS see above. General Appearance: alert, well-developed, well-nourished, NAD  Neck: No masses, trachea midline.   Respiratory: Normal respiratory effort. (+)wheeze/coarse breath sounds bilaterally diffuse worse at bases, no rhonchi  Cardiovascular: S1/S2 normal, no murmur, no rub/gallop auscultated. RRR.   Musculoskeletal: Gait normal. Symmetric and independent movement of all extremities  Neurological: Normal balance/coordination. No tremor.  Skin: warm, dry, intact.   Psychiatric: Normal judgment/insight. Normal mood and affect. Oriented x3.   No outpatient medications have been marked as taking for the 03/11/21 encounter (Office Visit) with [name], DO.    Allergies  Allergen Reactions  . Sitagliptin Other (See Comments)    headache    Patient Active Problem List   Diagnosis Date Noted  . Insomnia 01/01/2021  .  Port-A-Cath in place 12/04/2020  . Encounter for antineoplastic immunotherapy 06/04/2020  . Malignant neoplasm of right upper lobe of lung ([place]) 03/13/2020  . Encounter for antineoplastic chemotherapy 03/13/2020  . Goals of care, counseling/discussion 03/13/2020  . Lung mass 03/11/2020  . Gastroesophageal reflux disease without esophagitis 02/15/2018  . Type 2 diabetes mellitus without complication, without long-term current use of insulin ([place]) 07/07/2017  . Hyperlipidemia 07/07/2017    Family History  Problem Relation  Age of Onset  . High blood pressure Mother   . Stroke Mother   . Colon cancer Neg Hx   . Esophageal cancer Neg Hx     Social History   Tobacco Use  Smoking Status Former Smoker  . Packs/day: 2.00  . Years: 43.00  . Pack years: 86.00  . Types: Cigarettes  . Quit date: 04/15/2020  . Years since quitting: 0.9  Smokeless Tobacco Never Used    Past Surgical History:  Procedure Laterality Date  . broken bone repair    . BRONCHIAL BIOPSY  03/11/2020   Procedure: BRONCHIAL BIOPSIES;  Surgeon: Garner Nash, DO;  Location: [facility];  Service: Pulmonary;;  . COLONOSCOPY     around 63. [place]  03/11/2020   Procedure: CRYOTHERAPY;  Surgeon: Garner Nash, DO;  Location: [facility];  Service: Pulmonary;;  . FINE NEEDLE ASPIRATION  03/11/2020   Procedure: FINE NEEDLE ASPIRATION (FNA) LINEAR;  Surgeon: Garner Nash, DO;  Location: [facility];  Service: Pulmonary;;  . IR IMAGING GUIDED PORT INSERTION  10/16/2020  . KNEE ARTHROSCOPY WITH ANTERIOR CRUCIATE LIGAMENT (ACL) REPAIR     x 2  . VIDEO BRONCHOSCOPY WITH ENDOBRONCHIAL ULTRASOUND N/A 03/11/2020   Procedure: VIDEO BRONCHOSCOPY WITH ENDOBRONCHIAL ULTRASOUND;  Surgeon: Garner Nash, DO;  Location: [facility];  Service: Pulmonary;  Laterality: N/A;    Immunization History  Administered Date(s) Administered  . Influenza Split 07/24/2018  . Influenza, Seasonal, Injecte, Preservative Fre 09/16/2016, 07/16/2017  . Influenza,inj,Quad PF,6+ Mos 09/16/2016, 07/31/2018, 07/03/2019  . Influenza-Unspecified 07/30/2020  . PFIZER(Purple Top)SARS-COV-2 Vaccination 02/07/2020, 02/19/2020, 09/20/2020  . Pneumococcal Polysaccharide-23 04/30/2019  . Tdap 09/19/2018    Recent Results (from the past 2160 hour(s))  CBC with Differential (Cancer Center Only)     Status: Abnormal   Collection Time: 12/11/20  8:51 AM  Result Value Ref Range   WBC Count 13.9 (H) 4.0 - 10.5 K/uL   RBC 3.44 (L) 4.22 - 5.81 MIL/uL    Hemoglobin 10.0 (L) 13.0 - 17.0 g/dL   HCT 29.7 (L) 39.0 - 52.0 %   MCV 86.3 80.0 - 100.0 fL   MCH 29.1 26.0 - 34.0 pg   MCHC 33.7 30.0 - 36.0 g/dL   RDW 19.0 (H) 11.5 - 15.5 %   Platelet Count 213 150 - 400 K/uL   nRBC 0.0 0.0 - 0.2 %   Neutrophils Relative % 90 %   Neutro Abs 12.5 (H) 1.7 - 7.7 K/uL   Lymphocytes Relative 4 %   Lymphs Abs 0.5 (L) 0.7 - 4.0 K/uL   Monocytes Relative 5 %   Monocytes Absolute 0.7 0.1 - 1.0 K/uL   Eosinophils Relative 0 %   Eosinophils Absolute 0.0 0.0 - 0.5 K/uL   Basophils Relative 0 %   Basophils Absolute 0.0 0.0 - 0.1 K/uL   Immature Granulocytes 1 %   Abs Immature Granulocytes 0.07 0.00 - 0.07 K/uL    Comment: Performed at [facility] at [facility]  [address], [address], [place], [place] [address]  [place] ([place] only)     Status: Abnormal   Collection Time: 12/11/20  8:51 AM  Result Value Ref Range   Sodium 135 135 - 145 mmol/L   Potassium 4.0 3.5 - 5.1 mmol/L   Chloride 101 98 - 111 mmol/L   CO2 22 22 - 32 mmol/L   Glucose, Bld 383 (H) 70 - 99 mg/dL    Comment: Glucose reference range applies only to samples taken after fasting for at least 8 hours.   BUN 20 6 - 20 mg/dL   Creatinine 1.27 (H) 0.61 - 1.24 mg/dL   Calcium 10.4 (H) 8.9 - 10.3 mg/dL   Total Protein 7.2 6.5 - 8.1 g/dL   Albumin 4.2 3.5 - 5.0 g/dL   AST 11 (L) 15 - 41 U/L   ALT 16 0 - 44 U/L   Alkaline Phosphatase 141 (H) 38 - 126 U/L   Total Bilirubin 0.4 0.3 - 1.2 mg/dL   GFR, Estimated >60 >60 mL/min    Comment: (NOTE) Calculated using the CKD-EPI Creatinine Equation (2021)    Anion gap 12 5 - 15    Comment: Performed at [facility] at [facility], [address] [place], [place] [address]  Total Protein, Urine dipstick     Status: None   Collection Time: 12/11/20  8:58 AM  Result Value Ref Range   Protein, ur negative NEGATIVE mg/dL    Comment: Performed at [facility] at [facility], [address] [place], [place] [address]  TSH     Status: Abnormal   Collection Time: 12/17/20  9:12 AM  Result Value Ref Range   TSH 4.325 (H) 0.320 - 4.118 uIU/mL    Comment: Performed at [facility], [place] [address] [place], [place] [address]  CMP ([place] only)     Status: Abnormal   Collection Time: 12/17/20  9:13 AM  Result Value Ref Range   Sodium 134 (L) 135 - 145 mmol/L   Potassium 4.5 3.5 - 5.1 mmol/L   Chloride 103 98 - 111 mmol/L   CO2 22 22 - 32 mmol/L   Glucose, Bld 312 (H) 70 - 99 mg/dL    Comment: Glucose reference range applies only to samples taken after fasting for at least 8 hours.   BUN 18 6 - 20 mg/dL   Creatinine 1.32 (H) 0.61 - 1.24 mg/dL   Calcium 9.0 8.9 - 10.3 mg/dL   Total Protein 7.3 6.5 - 8.1 g/dL   Albumin 3.4 (L) 3.5 - 5.0 g/dL   AST 14 (L) 15 - 41 U/L   ALT 17 0 - 44 U/L   Alkaline Phosphatase 151 (H) 38 - 126 U/L   Total Bilirubin 0.8 0.3 - 1.2 mg/dL   GFR, Estimated >60 >60 mL/min    Comment: (NOTE) Calculated using the CKD-EPI Creatinine Equation (2021)    Anion gap 9 5 - 15    Comment: Performed at [facility], [place] [address] [place],  [address]  CBC with Differential ([place] Only)     Status: Abnormal   Collection Time: 12/17/20  9:13 AM  Result Value Ref Range   WBC Count 3.7 (L) 4.0 - 10.5 K/uL   RBC 3.87 (L) 4.22 - 5.81 MIL/uL   Hemoglobin 11.3 (L) 13.0 - 17.0 g/dL   HCT 33.1 (L) 39.0 - 52.0 %  MCV 85.5 80.0 - 100.0 fL   MCH 29.2 26.0 - 34.0 pg   MCHC 34.1 30.0 - 36.0 g/dL   RDW 18.6 (H) 11.5 - 15.5 %   Platelet Count 153 150 - 400 K/uL   nRBC 0.0 0.0 - 0.2 %   Neutrophils Relative % 68 %   Neutro Abs 2.5 1.7 - 7.7 K/uL   Lymphocytes Relative 12 %   Lymphs Abs 0.5 (L) 0.7 - 4.0 K/uL   Monocytes Relative 14 %   Monocytes Absolute 0.5 0.1 - 1.0 K/uL   Eosinophils Relative 4 %   Eosinophils Absolute 0.1 0.0 - 0.5 K/uL   Basophils Relative 2 %    Basophils Absolute 0.1 0.0 - 0.1 K/uL   WBC Morphology TOXIC GRANULATION     Comment: VACUOLATED NEUTROPHILS   Immature Granulocytes 0 %   Abs Immature Granulocytes 0.01 0.00 - 0.07 K/uL   Dohle Bodies PRESENT     Comment: Performed at [facility], [place] [address], [place], [place] [address]  CBC with Differential ([place] Only)     Status: Abnormal   Collection Time: 12/25/20  8:32 AM  Result Value Ref Range   WBC Count 12.3 (H) 4.0 - 10.5 K/uL   RBC 3.68 (L) 4.22 - 5.81 MIL/uL   Hemoglobin 10.8 (L) 13.0 - 17.0 g/dL   HCT 33.3 (L) 39.0 - 52.0 %   MCV 90.5 80.0 - 100.0 fL   MCH 29.3 26.0 - 34.0 pg   MCHC 32.4 30.0 - 36.0 g/dL   RDW 18.6 (H) 11.5 - 15.5 %   Platelet Count 179 150 - 400 K/uL   nRBC 0.0 0.0 - 0.2 %   Neutrophils Relative % 78 %   Neutro Abs 9.7 (H) 1.7 - 7.7 K/uL   Lymphocytes Relative 10 %   Lymphs Abs 1.2 0.7 - 4.0 K/uL   Monocytes Relative 9 %   Monocytes Absolute 1.1 (H) 0.1 - 1.0 K/uL   Eosinophils Relative 1 %   Eosinophils Absolute 0.2 0.0 - 0.5 K/uL   Basophils Relative 1 %   Basophils Absolute 0.1 0.0 - 0.1 K/uL   Immature Granulocytes 1 %   Abs Immature Granulocytes 0.10 (H) 0.00 - 0.07 K/uL    Comment: Performed at [facility] at [facility], [address], [place], [place] [address]  CMP ([place] only)     Status: Abnormal   Collection Time: 12/25/20  8:32 AM  Result Value Ref Range   Sodium 137 135 - 145 mmol/L   Potassium 4.6 3.5 - 5.1 mmol/L   Chloride 102 98 - 111 mmol/L   CO2 26 22 - 32 mmol/L   Glucose, Bld 217 (H) 70 - 99 mg/dL    Comment: Glucose reference range applies only to samples taken after fasting for at least 8 hours.   BUN 10 6 - 20 mg/dL   Creatinine 1.13 0.61 - 1.24 mg/dL   Calcium 9.1 8.9 - 10.3 mg/dL   Total Protein 6.6 6.5 - 8.1 g/dL   Albumin 3.8 3.5 - 5.0 g/dL   AST 16 15 - 41 U/L   ALT 22 0 - 44 U/L   Alkaline Phosphatase 141 (H) 38 - 126 U/L   Total  Bilirubin 0.4 0.3 - 1.2 mg/dL   GFR, Estimated >60 >60 mL/min    Comment: (NOTE) Calculated using the CKD-EPI Creatinine Equation (2021)    Anion gap 9 5 - 15  Comment: Performed at [facility] at [facility], [address], [place], [place] [address]  Total Protein, Urine dipstick     Status: None   Collection Time: 12/25/20  8:34 AM  Result Value Ref Range   Protein, ur negative NEGATIVE mg/dL    Comment: Performed at [facility] at [facility], [address] [place], [place] [address]  CMP ([place] only)     Status: Abnormal   Collection Time: 01/01/21  7:32 AM  Result Value Ref Range   Sodium 132 (L) 135 - 145 mmol/L   Potassium 4.5 3.5 - 5.1 mmol/L   Chloride 102 98 - 111 mmol/L   CO2 20 (L) 22 - 32 mmol/L   Glucose, Bld 397 (H) 70 - 99 mg/dL    Comment: Glucose reference range applies only to samples taken after fasting for at least 8 hours.   BUN 21 (H) 6 - 20 mg/dL   Creatinine 1.32 (H) 0.61 - 1.24 mg/dL   Calcium 9.6 8.9 - 10.3 mg/dL   Total Protein 7.4 6.5 - 8.1 g/dL   Albumin 3.5 3.5 - 5.0 g/dL   AST 11 (L) 15 - 41 U/L   ALT 21 0 - 44 U/L   Alkaline Phosphatase 129 (H) 38 - 126 U/L   Total Bilirubin 0.4 0.3 - 1.2 mg/dL   GFR, Estimated >60 >60 mL/min    Comment: (NOTE) Calculated using the CKD-EPI Creatinine Equation (2021)    Anion gap 10 5 - 15    Comment: Performed at [facility], [place] [address] [place], [place] [address]  CBC with Differential ([place] Only)     Status: Abnormal   Collection Time: 01/01/21  7:32 AM  Result Value Ref Range   WBC Count 17.5 (H) 4.0 - 10.5 K/uL   RBC 3.68 (L) 4.22 - 5.81 MIL/uL   Hemoglobin 10.8 (L) 13.0 - 17.0 g/dL   HCT 33.0 (L) 39.0 - 52.0 %   MCV 89.7 80.0 - 100.0 fL   MCH 29.3 26.0 - 34.0 pg   MCHC 32.7 30.0 - 36.0 g/dL   RDW 18.0 (H) 11.5 - 15.5 %   Platelet Count 295 150 - 400 K/uL   nRBC 0.0 0.0 - 0.2 %   Neutrophils  Relative % 89 %   Neutro Abs 15.7 (H) 1.7 - 7.7 K/uL   Lymphocytes Relative 5 %   Lymphs Abs 0.9 0.7 - 4.0 K/uL   Monocytes Relative 5 %   Monocytes Absolute 0.8 0.1 - 1.0 K/uL   Eosinophils Relative 0 %   Eosinophils Absolute 0.0 0.0 - 0.5 K/uL   Basophils Relative 0 %   Basophils Absolute 0.1 0.0 - 0.1 K/uL   Immature Granulocytes 1 %   Abs Immature Granulocytes 0.09 (H) 0.00 - 0.07 K/uL    Comment: Performed at [facility], [place] [address], [place], [place] [address]  Glucose, capillary     Status: Abnormal   Collection Time: 01/01/21 10:27 AM  Result Value Ref Range   Glucose-Capillary 298 (H) 70 - 99 mg/dL    Comment: Performed at [facility], [place] [address], [place], [place] [address]  CBC with Differential ([place] Only)     Status: Abnormal   Collection Time: 01/08/21  8:23 AM  Result Value Ref Range   WBC Count 11.2 (H) 4.0 - 10.5 K/uL   RBC 3.86 (  L) 4.22 - 5.81 MIL/uL   Hemoglobin 11.4 (L) 13.0 - 17.0 g/dL   HCT 33.9 (L) 39.0 - 52.0 %   MCV 87.8 80.0 - 100.0 fL   MCH 29.5 26.0 - 34.0 pg   MCHC 33.6 30.0 - 36.0 g/dL   RDW 17.5 (H) 11.5 - 15.5 %   Platelet Count 180 150 - 400 K/uL   nRBC 0.4 (H) 0.0 - 0.2 %   Neutrophils Relative % 71 %   Neutro Abs 7.8 (H) 1.7 - 7.7 K/uL   Lymphocytes Relative 12 %   Lymphs Abs 1.4 0.7 - 4.0 K/uL   Monocytes Relative 14 %   Monocytes Absolute 1.6 (H) 0.1 - 1.0 K/uL   Eosinophils Relative 1 %   Eosinophils Absolute 0.1 0.0 - 0.5 K/uL   Basophils Relative 1 %   Basophils Absolute 0.1 0.0 - 0.1 K/uL   Immature Granulocytes 1 %   Abs Immature Granulocytes 0.15 (H) 0.00 - 0.07 K/uL    Comment: Performed at [facility] at [facility], [address], [place], [place] [address]  CMP ([place] only)     Status: Abnormal   Collection Time: 01/08/21  8:23 AM  Result Value Ref Range   Sodium 134 (L) 135 - 145 mmol/L   Potassium 4.4 3.5 - 5.1 mmol/L    Chloride 101 98 - 111 mmol/L   CO2 24 22 - 32 mmol/L   Glucose, Bld 284 (H) 70 - 99 mg/dL    Comment: Glucose reference range applies only to samples taken after fasting for at least 8 hours.   BUN 16 6 - 20 mg/dL   Creatinine 1.14 0.61 - 1.24 mg/dL   Calcium 10.1 8.9 - 10.3 mg/dL   Total Protein 7.0 6.5 - 8.1 g/dL   Albumin 4.0 3.5 - 5.0 g/dL   AST 14 (L) 15 - 41 U/L   ALT 17 0 - 44 U/L   Alkaline Phosphatase 179 (H) 38 - 126 U/L   Total Bilirubin 0.5 0.3 - 1.2 mg/dL   GFR, Estimated >60 >60 mL/min    Comment: (NOTE) Calculated using the CKD-EPI Creatinine Equation (2021)    Anion gap 9 5 - 15    Comment: Performed at [facility] at [facility], [address] [place], [place] [address]  TSH     Status: Abnormal   Collection Time: 01/08/21  8:24 AM  Result Value Ref Range   TSH 9.272 (H) 0.320 - 4.118 uIU/mL    Comment: Performed at [facility], [place] [address], [place], [place] [address]  CBC with Differential ([place] Only)     Status: Abnormal   Collection Time: 01/15/21  8:08 AM  Result Value Ref Range   WBC Count 13.9 (H) 4.0 - 10.5 K/uL   RBC 3.67 (L) 4.22 - 5.81 MIL/uL   Hemoglobin 11.0 (L) 13.0 - 17.0 g/dL   HCT 33.2 (L) 39.0 - 52.0 %   MCV 90.5 80.0 - 100.0 fL   MCH 30.0 26.0 - 34.0 pg   MCHC 33.1 30.0 - 36.0 g/dL   RDW 17.9 (H) 11.5 - 15.5 %   Platelet Count 158 150 - 400 K/uL   nRBC 0.1 0.0 - 0.2 %   Neutrophils Relative % 80 %   Neutro Abs 11.1 (H) 1.7 - 7.7 K/uL   Lymphocytes Relative 10 %   Lymphs Abs 1.4 0.7 - 4.0 K/uL  Monocytes Relative 7 %   Monocytes Absolute 1.0 0.1 - 1.0 K/uL   Eosinophils Relative 1 %   Eosinophils Absolute 0.2 0.0 - 0.5 K/uL   Basophils Relative 1 %   Basophils Absolute 0.1 0.0 - 0.1 K/uL   Immature Granulocytes 1 %   Abs Immature Granulocytes 0.12 (H) 0.00 - 0.07 K/uL    Comment: Performed at [facility] at [facility], [address] [place], [place] [address]  CMP ([place] only)     Status: Abnormal   Collection Time: 01/15/21  8:08 AM  Result Value Ref Range   Sodium 137 135 - 145 mmol/L   Potassium 4.3 3.5 - 5.1 mmol/L   Chloride 104 98 - 111 mmol/L   CO2 24 22 - 32 mmol/L   Glucose, Bld 176 (H) 70 - 99 mg/dL    Comment: Glucose reference range applies only to samples taken after fasting for at least 8 hours.   BUN 10 6 - 20 mg/dL   Creatinine 1.10 0.61 - 1.24 mg/dL   Calcium 9.7 8.9 - 10.3 mg/dL   Total Protein 6.9 6.5 - 8.1 g/dL   Albumin 4.1 3.5 - 5.0 g/dL   AST 15 15 - 41 U/L   ALT 17 0 - 44 U/L   Alkaline Phosphatase 146 (H) 38 - 126 U/L   Total Bilirubin 0.4 0.3 - 1.2 mg/dL   GFR, Estimated >60 >60 mL/min    Comment: (NOTE) Calculated using the CKD-EPI Creatinine Equation (2021)    Anion gap 9 5 - 15    Comment: Performed at [facility] at [facility], [address] [place], [place] [address]  Total Protein, Urine dipstick     Status: Abnormal   Collection Time: 01/15/21  8:09 AM  Result Value Ref Range   Protein, ur 100 (A) NEGATIVE mg/dL    Comment: Performed at [facility] at [facility], [address] [place], [place] [address]  CMP ([place] only)     Status: Abnormal   Collection Time: 01/22/21  9:16 AM  Result Value Ref Range   Sodium 136 135 - 145 mmol/L   Potassium 4.2 3.5 - 5.1 mmol/L   Chloride 106 98 - 111 mmol/L   CO2 20 (L) 22 - 32 mmol/L   Glucose, Bld 248 (H) 70 - 99 mg/dL    Comment: Glucose reference range applies only to samples taken after fasting for at least 8 hours.   BUN 14 6 - 20 mg/dL   Creatinine 1.19 0.61 - 1.24 mg/dL   Calcium 9.4 8.9 - 10.3 mg/dL   Total Protein 7.5 6.5 - 8.1 g/dL   Albumin 3.6 3.5 - 5.0 g/dL   AST 11 (L) 15 - 41 U/L   ALT 14 0 - 44 U/L   Alkaline Phosphatase 131 (H) 38 - 126 U/L   Total Bilirubin 0.4 0.3 - 1.2 mg/dL   GFR, Estimated >60 >60 mL/min    Comment:  (NOTE) Calculated using the CKD-EPI Creatinine Equation (2021)    Anion gap 10 5 - 15    Comment: Performed at [facility], [place] [address], [place], [place] [address]  CBC with Differential ([place] Only)     Status: Abnormal   Collection Time: 01/22/21  9:16 AM  Result Value Ref Range   WBC Count 20.3 (H) 4.0 - 10.5 K/uL   RBC  3.42 (L) 4.22 - 5.81 MIL/uL   Hemoglobin 10.3 (L) 13.0 - 17.0 g/dL   HCT 31.0 (L) 39.0 - 52.0 %   MCV 90.6 80.0 - 100.0 fL   MCH 30.1 26.0 - 34.0 pg   MCHC 33.2 30.0 - 36.0 g/dL   RDW 17.4 (H) 11.5 - 15.5 %   Platelet Count 231 150 - 400 K/uL   nRBC 0.0 0.0 - 0.2 %   Neutrophils Relative % 87 %   Neutro Abs 17.8 (H) 1.7 - 7.7 K/uL   Lymphocytes Relative 5 %   Lymphs Abs 1.0 0.7 - 4.0 K/uL   Monocytes Relative 7 %   Monocytes Absolute 1.4 (H) 0.1 - 1.0 K/uL   Eosinophils Relative 0 %   Eosinophils Absolute 0.0 0.0 - 0.5 K/uL   Basophils Relative 0 %   Basophils Absolute 0.1 0.0 - 0.1 K/uL   Immature Granulocytes 1 %   Abs Immature Granulocytes 0.12 (H) 0.00 - 0.07 K/uL    Comment: Performed at [facility], [address], [place], [place] [address]  CBC with Differential ([place] Only)     Status: Abnormal   Collection Time: 01/29/21  7:51 AM  Result Value Ref Range   WBC Count 8.9 4.0 - 10.5 K/uL   RBC 3.33 (L) 4.22 - 5.81 MIL/uL   Hemoglobin 10.1 (L) 13.0 - 17.0 g/dL   HCT 30.1 (L) 39.0 - 52.0 %   MCV 90.4 80.0 - 100.0 fL   MCH 30.3 26.0 - 34.0 pg   MCHC 33.6 30.0 - 36.0 g/dL   RDW 17.0 (H) 11.5 - 15.5 %   Platelet Count 153 150 - 400 K/uL   nRBC 0.2 0.0 - 0.2 %   Neutrophils Relative % 73 %   Neutro Abs 6.6 1.7 - 7.7 K/uL   Lymphocytes Relative 11 %   Lymphs Abs 1.0 0.7 - 4.0 K/uL   Monocytes Relative 13 %   Monocytes Absolute 1.1 (H) 0.1 - 1.0 K/uL   Eosinophils Relative 1 %   Eosinophils Absolute 0.1 0.0 - 0.5 K/uL   Basophils Relative 1 %   Basophils Absolute 0.1 0.0 - 0.1  K/uL   Immature Granulocytes 1 %   Abs Immature Granulocytes 0.08 (H) 0.00 - 0.07 K/uL    Comment: Performed at [facility] at [facility], [address] [place], [place] [address]  CMP ([place] only)     Status: Abnormal   Collection Time: 01/29/21  7:51 AM  Result Value Ref Range   Sodium 139 135 - 145 mmol/L   Potassium 3.8 3.5 - 5.1 mmol/L   Chloride 106 98 - 111 mmol/L   CO2 24 22 - 32 mmol/L   Glucose, Bld 142 (H) 70 - 99 mg/dL    Comment: Glucose reference range applies only to samples taken after fasting for at least 8 hours.   BUN 12 6 - 20 mg/dL   Creatinine 1.02 0.61 - 1.24 mg/dL   Calcium 9.2 8.9 - 10.3 mg/dL   Total Protein 6.4 (L) 6.5 - 8.1 g/dL   Albumin 3.8 3.5 - 5.0 g/dL   AST 14 (L) 15 - 41 U/L   ALT 13 0 - 44 U/L   Alkaline Phosphatase 140 (H) 38 - 126 U/L   Total Bilirubin 0.5 0.3 - 1.2 mg/dL   GFR, Estimated >60 >60 mL/min    Comment: (NOTE) Calculated using the CKD-EPI Creatinine Equation (2021)  Anion gap 9 5 - 15    Comment: Performed at [facility] Lab at [facility], [place], [place], [place] [address]  TSH     Status: Abnormal   Collection Time: 01/29/21  7:52 AM  Result Value Ref Range   TSH 4.559 (H) 0.320 - 4.118 uIU/mL    Comment: Performed at [facility], [address] [place], [place] [address]  CMP ([place] only)     Status: Abnormal   Collection Time: 02/05/21  8:06 AM  Result Value Ref Range   Sodium 138 135 - 145 mmol/L   Potassium 4.8 3.5 - 5.1 mmol/L   Chloride 103 98 - 111 mmol/L   CO2 25 22 - 32 mmol/L   Glucose, Bld 156 (H) 70 - 99 mg/dL    Comment: Glucose reference range applies only to samples taken after fasting for at least 8 hours.   BUN 16 6 - 20 mg/dL   Creatinine 1.41 (H) 0.61 - 1.24 mg/dL   Calcium 9.7 8.9 - 10.3 mg/dL   Total Protein 7.4 6.5 - 8.1 g/dL   Albumin 4.0 3.5 - 5.0 g/dL   AST 16 15 - 41 U/L   ALT 17 0 -  44 U/L   Alkaline Phosphatase 148 (H) 38 - 126 U/L   Total Bilirubin 0.5 0.3 - 1.2 mg/dL   GFR, Estimated 57 (L) >60 mL/min    Comment: (NOTE) Calculated using the CKD-EPI Creatinine Equation (2021)    Anion gap 10 5 - 15    Comment: Performed at [facility] at [facility], [address] [place], [place] [address]  CBC with Differential ([place] Only)     Status: Abnormal   Collection Time: 02/05/21  8:06 AM  Result Value Ref Range   WBC Count 15.0 (H) 4.0 - 10.5 K/uL   RBC 3.72 (L) 4.22 - 5.81 MIL/uL   Hemoglobin 11.2 (L) 13.0 - 17.0 g/dL   HCT 34.7 (L) 39.0 - 52.0 %   MCV 93.3 80.0 - 100.0 fL   MCH 30.1 26.0 - 34.0 pg   MCHC 32.3 30.0 - 36.0 g/dL   RDW 17.0 (H) 11.5 - 15.5 %   Platelet Count 208 150 - 400 K/uL   nRBC 0.0 0.0 - 0.2 %   Neutrophils Relative % 80 %   Neutro Abs 12.1 (H) 1.7 - 7.7 K/uL   Lymphocytes Relative 10 %   Lymphs Abs 1.5 0.7 - 4.0 K/uL   Monocytes Relative 7 %   Monocytes Absolute 1.1 (H) 0.1 - 1.0 K/uL   Eosinophils Relative 2 %   Eosinophils Absolute 0.2 0.0 - 0.5 K/uL   Basophils Relative 1 %   Basophils Absolute 0.1 0.0 - 0.1 K/uL   Immature Granulocytes 0 %   Abs Immature Granulocytes 0.06 0.00 - 0.07 K/uL    Comment: Performed at [facility] at [facility], [address] [place], [place] [address]  Total Protein, Urine dipstick     Status: Abnormal   Collection Time: 02/11/21  8:20 AM  Result Value Ref Range   Protein, ur 30 (A) NEGATIVE mg/dL    Comment: Performed at [facility], [address] [place], [place] [address]  CMP ([place] only)     Status: Abnormal   Collection Time: 02/11/21  8:25 AM  Result Value Ref Range   Sodium 139  135 - 145 mmol/L   Potassium 4.3 3.5 - 5.1 mmol/L   Chloride 106 98 - 111 mmol/L   CO2 21 (L) 22 - 32 mmol/L   Glucose, Bld 230 (H) 70 - 99 mg/dL    Comment: Glucose reference range applies only to samples taken  after fasting for at least 8 hours.   BUN 16 6 - 20 mg/dL   Creatinine 1.20 0.61 - 1.24 mg/dL   Calcium 8.8 (L) 8.9 - 10.3 mg/dL   Total Protein 7.2 6.5 - 8.1 g/dL   Albumin 3.5 3.5 - 5.0 g/dL   AST 12 (L) 15 - 41 U/L   ALT 13 0 - 44 U/L   Alkaline Phosphatase 127 (H) 38 - 126 U/L   Total Bilirubin 0.4 0.3 - 1.2 mg/dL   GFR, Estimated >60 >60 mL/min    Comment: (NOTE) Calculated using the CKD-EPI Creatinine Equation (2021)    Anion gap 12 5 - 15    Comment: Performed at [facility], [place] [address] [place], [place] [address]  CBC with Differential ([place] Only)     Status: Abnormal   Collection Time: 02/11/21  8:25 AM  Result Value Ref Range   WBC Count 16.6 (H) 4.0 - 10.5 K/uL   RBC 3.21 (L) 4.22 - 5.81 MIL/uL   Hemoglobin 9.8 (L) 13.0 - 17.0 g/dL   HCT 29.7 (L) 39.0 - 52.0 %   MCV 92.5 80.0 - 100.0 fL   MCH 30.5 26.0 - 34.0 pg   MCHC 33.0 30.0 - 36.0 g/dL   RDW 16.4 (H) 11.5 - 15.5 %   Platelet Count 215 150 - 400 K/uL   nRBC 0.1 0.0 - 0.2 %   Neutrophils Relative % 88 %   Neutro Abs 14.7 (H) 1.7 - 7.7 K/uL   Lymphocytes Relative 5 %   Lymphs Abs 0.8 0.7 - 4.0 K/uL   Monocytes Relative 6 %   Monocytes Absolute 1.0 0.1 - 1.0 K/uL   Eosinophils Relative 0 %   Eosinophils Absolute 0.0 0.0 - 0.5 K/uL   Basophils Relative 0 %   Basophils Absolute 0.1 0.0 - 0.1 K/uL   Immature Granulocytes 1 %   Abs Immature Granulocytes 0.08 (H) 0.00 - 0.07 K/uL    Comment: Performed at [facility], [place] [address], [place], [place] [address]  CBC with Differential ([place] Only)     Status: Abnormal   Collection Time: 02/19/21  8:09 AM  Result Value Ref Range   WBC Count 11.9 (H) 4.0 - 10.5 K/uL   RBC 3.53 (L) 4.22 - 5.81 MIL/uL   Hemoglobin 10.8 (L) 13.0 - 17.0 g/dL   HCT 32.2 (L) 39.0 - 52.0 %   MCV 91.2 80.0 - 100.0 fL   MCH 30.6 26.0 - 34.0 pg   MCHC 33.5 30.0 - 36.0 g/dL   RDW 16.2 (H) 11.5 - 15.5 %   Platelet  Count 186 150 - 400 K/uL   nRBC 0.5 (H) 0.0 - 0.2 %   Neutrophils Relative % 71 %   Neutro Abs 8.5 (H) 1.7 - 7.7 K/uL   Lymphocytes Relative 11 %   Lymphs Abs 1.3 0.7 - 4.0 K/uL   Monocytes Relative 15 %   Monocytes Absolute 1.8 (H) 0.1 - 1.0 K/uL   Eosinophils Relative 1 %   Eosinophils Absolute 0.1 0.0 - 0.5 K/uL   Basophils Relative 1 %   Basophils Absolute 0.1 0.0 - 0.1  K/uL   Immature Granulocytes 1 %   Abs Immature Granulocytes 0.15 (H) 0.00 - 0.07 K/uL    Comment: Performed at [facility] at [facility], [address] [place], [place] [address]  CMP ([place] only)     Status: Abnormal   Collection Time: 02/19/21  8:09 AM  Result Value Ref Range   Sodium 137 135 - 145 mmol/L   Potassium 3.9 3.5 - 5.1 mmol/L   Chloride 103 98 - 111 mmol/L   CO2 24 22 - 32 mmol/L   Glucose, Bld 124 (H) 70 - 99 mg/dL    Comment: Glucose reference range applies only to samples taken after fasting for at least 8 hours.   BUN 22 (H) 6 - 20 mg/dL   Creatinine 1.55 (H) 0.61 - 1.24 mg/dL   Calcium 9.5 8.9 - 10.3 mg/dL   Total Protein 6.9 6.5 - 8.1 g/dL   Albumin 3.8 3.5 - 5.0 g/dL   AST 14 (L) 15 - 41 U/L   ALT 12 0 - 44 U/L   Alkaline Phosphatase 144 (H) 38 - 126 U/L   Total Bilirubin 0.5 0.3 - 1.2 mg/dL   GFR, Estimated 51 (L) >60 mL/min    Comment: (NOTE) Calculated using the CKD-EPI Creatinine Equation (2021)    Anion gap 10 5 - 15    Comment: Performed at [facility] at [facility], [address], [place], [place] [address]  TSH     Status: Abnormal   Collection Time: 02/19/21  8:09 AM  Result Value Ref Range   TSH 6.441 (H) 0.320 - 4.118 uIU/mL    Comment: Performed at [facility], [place] [address], [place], [place] [address]  CMP ([place] only)     Status: Abnormal   Collection Time: 02/26/21  8:07 AM  Result Value Ref Range   Sodium 139 135 - 145 mmol/L   Potassium 4.3 3.5 - 5.1 mmol/L    Chloride 105 98 - 111 mmol/L   CO2 25 22 - 32 mmol/L   Glucose, Bld 130 (H) 70 - 99 mg/dL    Comment: Glucose reference range applies only to samples taken after fasting for at least 8 hours.   BUN 12 6 - 20 mg/dL   Creatinine 1.28 (H) 0.61 - 1.24 mg/dL   Calcium 9.5 8.9 - 10.3 mg/dL   Total Protein 6.7 6.5 - 8.1 g/dL   Albumin 3.7 3.5 - 5.0 g/dL   AST 15 15 - 41 U/L   ALT 15 0 - 44 U/L   Alkaline Phosphatase 132 (H) 38 - 126 U/L   Total Bilirubin 0.4 0.3 - 1.2 mg/dL   GFR, Estimated >60 >60 mL/min    Comment: (NOTE) Calculated using the CKD-EPI Creatinine Equation (2021)    Anion gap 9 5 - 15    Comment: Performed at [facility] at [facility], [address], [place], [place] [address]  CBC with Differential ([facility] Only)     Status: Abnormal   Collection Time: 02/26/21  8:07 AM  Result Value Ref Range   WBC Count 14.3 (H) 4.0 - 10.5 K/uL   RBC 3.40 (L) 4.22 - 5.81 MIL/uL   Hemoglobin 10.3 (L) 13.0 - 17.0 g/dL   HCT 31.7 (L) 39.0 - 52.0 %   MCV 93.2 80.0 - 100.0 fL   MCH 30.3 26.0 - 34.0 pg  MCHC 32.5 30.0 - 36.0 g/dL   RDW 16.6 (H) 11.5 - 15.5 %   Platelet Count 187 150 - 400 K/uL   nRBC 0.0 0.0 - 0.2 %   Neutrophils Relative % 81 %   Neutro Abs 11.7 (H) 1.7 - 7.7 K/uL   Lymphocytes Relative 8 %   Lymphs Abs 1.1 0.7 - 4.0 K/uL   Monocytes Relative 8 %   Monocytes Absolute 1.2 (H) 0.1 - 1.0 K/uL   Eosinophils Relative 1 %   Eosinophils Absolute 0.2 0.0 - 0.5 K/uL   Basophils Relative 1 %   Basophils Absolute 0.1 0.0 - 0.1 K/uL   Immature Granulocytes 1 %   Abs Immature Granulocytes 0.08 (H) 0.00 - 0.07 K/uL    Comment: Performed at [facility] at [facility], [address] [place], [place] [address]  CMP ([place] only)     Status: Abnormal   Collection Time: 03/05/21  8:10 AM  Result Value Ref Range   Sodium 142 135 - 145 mmol/L   Potassium 4.1 3.5 - 5.1 mmol/L   Chloride 107 98 - 111 mmol/L    CO2 23 22 - 32 mmol/L   Glucose, Bld 201 (H) 70 - 99 mg/dL    Comment: Glucose reference range applies only to samples taken after fasting for at least 8 hours.   BUN 17 6 - 20 mg/dL   Creatinine 1.32 (H) 0.61 - 1.24 mg/dL   Calcium 9.9 8.9 - 10.3 mg/dL   Total Protein 7.1 6.5 - 8.1 g/dL   Albumin 3.3 (L) 3.5 - 5.0 g/dL   AST 10 (L) 15 - 41 U/L   ALT 9 0 - 44 U/L   Alkaline Phosphatase 113 38 - 126 U/L   Total Bilirubin 0.4 0.3 - 1.2 mg/dL   GFR, Estimated >60 >60 mL/min    Comment: (NOTE) Calculated using the CKD-EPI Creatinine Equation (2021)    Anion gap 12 5 - 15    Comment: Performed at [facility], [place] [address] [place], [place] [address]  CBC with Differential ([place] Only)     Status: Abnormal   Collection Time: 03/05/21  8:10 AM  Result Value Ref Range   WBC Count 18.7 (H) 4.0 - 10.5 K/uL   RBC 3.16 (L) 4.22 - 5.81 MIL/uL   Hemoglobin 9.5 (L) 13.0 - 17.0 g/dL   HCT 30.4 (L) 39.0 - 52.0 %   MCV 96.2 80.0 - 100.0 fL   MCH 30.1 26.0 - 34.0 pg   MCHC 31.3 30.0 - 36.0 g/dL   RDW 16.6 (H) 11.5 - 15.5 %   Platelet Count 229 150 - 400 K/uL   nRBC 0.0 0.0 - 0.2 %   Neutrophils Relative % 86 %   Neutro Abs 15.9 (H) 1.7 - 7.7 K/uL   Lymphocytes Relative 6 %   Lymphs Abs 1.1 0.7 - 4.0 K/uL   Monocytes Relative 8 %   Monocytes Absolute 1.5 (H) 0.1 - 1.0 K/uL   Eosinophils Relative 0 %   Eosinophils Absolute 0.0 0.0 - 0.5 K/uL   Basophils Relative 0 %   Basophils Absolute 0.0 0.0 - 0.1 K/uL   Immature Granulocytes 0 %   Abs Immature Granulocytes 0.08 (H) 0.00 - 0.07 K/uL    Comment: Performed at [facility], [place] [address] [place], [place] [address]    No results found.     All questions at time  of visit were answered - patient instructed to contact office with any additional concerns or updates. ER/RTC precautions were reviewed with the patient as applicable.   Please note: manual typing as well as voice  recognition software may have been used to produce this document - typos may escape review. Please contact Dr. Sheppard Coil for any needed clarifications.

## 2021-03-12 ENCOUNTER — Inpatient Hospital Stay: Payer: BC Managed Care – PPO

## 2021-03-12 ENCOUNTER — Ambulatory Visit: Payer: BC Managed Care – PPO | Admitting: Radiation Oncology

## 2021-03-12 ENCOUNTER — Encounter: Payer: Self-pay | Admitting: Internal Medicine

## 2021-03-12 ENCOUNTER — Other Ambulatory Visit: Payer: Self-pay | Admitting: Physician Assistant

## 2021-03-12 DIAGNOSIS — E7800 Pure hypercholesterolemia, unspecified: Secondary | ICD-10-CM | POA: Diagnosis not present

## 2021-03-12 DIAGNOSIS — C3411 Malignant neoplasm of upper lobe, right bronchus or lung: Secondary | ICD-10-CM

## 2021-03-12 DIAGNOSIS — Z79899 Other long term (current) drug therapy: Secondary | ICD-10-CM | POA: Diagnosis not present

## 2021-03-12 DIAGNOSIS — Z5189 Encounter for other specified aftercare: Secondary | ICD-10-CM | POA: Diagnosis not present

## 2021-03-12 DIAGNOSIS — Z5112 Encounter for antineoplastic immunotherapy: Secondary | ICD-10-CM | POA: Diagnosis not present

## 2021-03-12 DIAGNOSIS — R5383 Other fatigue: Secondary | ICD-10-CM | POA: Diagnosis not present

## 2021-03-12 DIAGNOSIS — E039 Hypothyroidism, unspecified: Secondary | ICD-10-CM

## 2021-03-12 DIAGNOSIS — K219 Gastro-esophageal reflux disease without esophagitis: Secondary | ICD-10-CM | POA: Diagnosis not present

## 2021-03-12 DIAGNOSIS — E119 Type 2 diabetes mellitus without complications: Secondary | ICD-10-CM | POA: Diagnosis not present

## 2021-03-12 DIAGNOSIS — Z7982 Long term (current) use of aspirin: Secondary | ICD-10-CM | POA: Diagnosis not present

## 2021-03-12 DIAGNOSIS — R531 Weakness: Secondary | ICD-10-CM | POA: Diagnosis not present

## 2021-03-12 DIAGNOSIS — G4700 Insomnia, unspecified: Secondary | ICD-10-CM | POA: Diagnosis not present

## 2021-03-12 DIAGNOSIS — Z5111 Encounter for antineoplastic chemotherapy: Secondary | ICD-10-CM | POA: Diagnosis not present

## 2021-03-12 LAB — CBC WITH DIFFERENTIAL (CANCER CENTER ONLY)
Abs Immature Granulocytes: 0.05 10*3/uL (ref 0.00–0.07)
Basophils Absolute: 0.1 10*3/uL (ref 0.0–0.1)
Basophils Relative: 1 %
Eosinophils Absolute: 0.1 10*3/uL (ref 0.0–0.5)
Eosinophils Relative: 2 %
HCT: 29.5 % — ABNORMAL LOW (ref 39.0–52.0)
Hemoglobin: 9.7 g/dL — ABNORMAL LOW (ref 13.0–17.0)
Immature Granulocytes: 1 %
Lymphocytes Relative: 14 %
Lymphs Abs: 1.1 10*3/uL (ref 0.7–4.0)
MCH: 30.1 pg (ref 26.0–34.0)
MCHC: 32.9 g/dL (ref 30.0–36.0)
MCV: 91.6 fL (ref 80.0–100.0)
Monocytes Absolute: 1.1 10*3/uL — ABNORMAL HIGH (ref 0.1–1.0)
Monocytes Relative: 13 %
Neutro Abs: 5.5 10*3/uL (ref 1.7–7.7)
Neutrophils Relative %: 69 %
Platelet Count: 183 10*3/uL (ref 150–400)
RBC: 3.22 MIL/uL — ABNORMAL LOW (ref 4.22–5.81)
RDW: 16.4 % — ABNORMAL HIGH (ref 11.5–15.5)
WBC Count: 7.9 10*3/uL (ref 4.0–10.5)
nRBC: 0.3 % — ABNORMAL HIGH (ref 0.0–0.2)

## 2021-03-12 LAB — CMP (CANCER CENTER ONLY)
ALT: 15 U/L (ref 0–44)
AST: 15 U/L (ref 15–41)
Albumin: 3.8 g/dL (ref 3.5–5.0)
Alkaline Phosphatase: 123 U/L (ref 38–126)
Anion gap: 9 (ref 5–15)
BUN: 15 mg/dL (ref 6–20)
CO2: 25 mmol/L (ref 22–32)
Calcium: 9.7 mg/dL (ref 8.9–10.3)
Chloride: 105 mmol/L (ref 98–111)
Creatinine: 1.18 mg/dL (ref 0.61–1.24)
GFR, Estimated: >60 mL/min (ref >60)
Glucose, Bld: 130 mg/dL — ABNORMAL HIGH (ref 70–99)
Potassium: 4.3 mmol/L (ref 3.5–5.1)
Sodium: 139 mmol/L (ref 135–145)
Total Bilirubin: 0.5 mg/dL (ref 0.3–1.2)
Total Protein: 6.9 g/dL (ref 6.5–8.1)

## 2021-03-12 LAB — TSH: TSH: 5.088 u[IU]/mL — ABNORMAL HIGH (ref 0.320–4.118)

## 2021-03-12 MED ORDER — LEVOTHYROXINE SODIUM 75 MCG PO TABS: 75.0000 ug | ORAL_TABLET | Freq: Every day | ORAL | 1 refills | Status: DC

## 2021-03-12 NOTE — Progress Notes (Signed)
I called the patient to let him know I am going to slightly increase the dose of his synthroid. He expressed understanding.

## 2021-03-13 ENCOUNTER — Other Ambulatory Visit: Payer: BC Managed Care – PPO

## 2021-03-13 ENCOUNTER — Ambulatory Visit: Payer: BC Managed Care – PPO

## 2021-03-13 ENCOUNTER — Ambulatory Visit: Payer: BC Managed Care – PPO | Admitting: Radiation Oncology

## 2021-03-13 ENCOUNTER — Inpatient Hospital Stay: Payer: BC Managed Care – PPO

## 2021-03-13 DIAGNOSIS — Z87891 Personal history of nicotine dependence: Secondary | ICD-10-CM | POA: Diagnosis not present

## 2021-03-13 DIAGNOSIS — C3411 Malignant neoplasm of upper lobe, right bronchus or lung: Secondary | ICD-10-CM | POA: Diagnosis not present

## 2021-03-16 ENCOUNTER — Encounter: Payer: Self-pay | Admitting: Radiation Oncology

## 2021-03-17 ENCOUNTER — Ambulatory Visit
Admission: RE | Admit: 2021-03-17 | Discharge: 2021-03-17 | Disposition: A | Discharge status: Home / Self Care | Payer: BC Managed Care – PPO | Source: Ambulatory Visit | Attending: Radiation Oncology | Admitting: Radiation Oncology

## 2021-03-17 ENCOUNTER — Other Ambulatory Visit: Payer: Self-pay

## 2021-03-17 ENCOUNTER — Encounter: Payer: Self-pay | Admitting: Internal Medicine

## 2021-03-17 DIAGNOSIS — C3411 Malignant neoplasm of upper lobe, right bronchus or lung: Secondary | ICD-10-CM | POA: Diagnosis not present

## 2021-03-19 ENCOUNTER — Ambulatory Visit
Admission: RE | Admit: 2021-03-19 | Discharge: 2021-03-19 | Disposition: A | Discharge status: Home / Self Care | Payer: BC Managed Care – PPO | Source: Ambulatory Visit | Attending: Radiation Oncology | Admitting: Radiation Oncology

## 2021-03-19 DIAGNOSIS — C3411 Malignant neoplasm of upper lobe, right bronchus or lung: Secondary | ICD-10-CM | POA: Diagnosis not present

## 2021-03-20 ENCOUNTER — Other Ambulatory Visit: Payer: BC Managed Care – PPO

## 2021-03-20 ENCOUNTER — Inpatient Hospital Stay: Payer: BC Managed Care – PPO

## 2021-03-20 ENCOUNTER — Telehealth: Payer: Self-pay

## 2021-03-20 ENCOUNTER — Ambulatory Visit: Payer: BC Managed Care – PPO

## 2021-03-20 ENCOUNTER — Inpatient Hospital Stay: Payer: BC Managed Care – PPO | Attending: Internal Medicine

## 2021-03-20 ENCOUNTER — Other Ambulatory Visit (HOSPITAL_COMMUNITY)
Admission: RE | Admit: 2021-03-20 | Discharge: 2021-03-20 | Disposition: A | Discharge status: Home / Self Care | Payer: BC Managed Care – PPO | Source: Ambulatory Visit | Attending: Gastroenterology | Admitting: Gastroenterology

## 2021-03-20 ENCOUNTER — Other Ambulatory Visit: Payer: Self-pay

## 2021-03-20 DIAGNOSIS — Z5189 Encounter for other specified aftercare: Secondary | ICD-10-CM | POA: Insufficient documentation

## 2021-03-20 DIAGNOSIS — C3411 Malignant neoplasm of upper lobe, right bronchus or lung: Secondary | ICD-10-CM | POA: Diagnosis not present

## 2021-03-20 DIAGNOSIS — Z7982 Long term (current) use of aspirin: Secondary | ICD-10-CM | POA: Diagnosis not present

## 2021-03-20 DIAGNOSIS — E119 Type 2 diabetes mellitus without complications: Secondary | ICD-10-CM | POA: Insufficient documentation

## 2021-03-20 DIAGNOSIS — Z01812 Encounter for preprocedural laboratory examination: Secondary | ICD-10-CM | POA: Insufficient documentation

## 2021-03-20 DIAGNOSIS — Z5112 Encounter for antineoplastic immunotherapy: Secondary | ICD-10-CM | POA: Diagnosis not present

## 2021-03-20 DIAGNOSIS — Z79899 Other long term (current) drug therapy: Secondary | ICD-10-CM | POA: Diagnosis not present

## 2021-03-20 DIAGNOSIS — Z20822 Contact with and (suspected) exposure to covid-19: Secondary | ICD-10-CM | POA: Diagnosis not present

## 2021-03-20 DIAGNOSIS — Z5111 Encounter for antineoplastic chemotherapy: Secondary | ICD-10-CM | POA: Diagnosis not present

## 2021-03-20 LAB — CBC WITH DIFFERENTIAL (CANCER CENTER ONLY)
Abs Immature Granulocytes: 0.08 10*3/uL — ABNORMAL HIGH (ref 0.00–0.07)
Basophils Absolute: 0.1 10*3/uL (ref 0.0–0.1)
Basophils Relative: 1 %
Eosinophils Absolute: 0.1 10*3/uL (ref 0.0–0.5)
Eosinophils Relative: 1 %
HCT: 31 % — ABNORMAL LOW (ref 39.0–52.0)
Hemoglobin: 10.1 g/dL — ABNORMAL LOW (ref 13.0–17.0)
Immature Granulocytes: 1 %
Lymphocytes Relative: 8 %
Lymphs Abs: 1.2 10*3/uL (ref 0.7–4.0)
MCH: 30.5 pg (ref 26.0–34.0)
MCHC: 32.6 g/dL (ref 30.0–36.0)
MCV: 93.7 fL (ref 80.0–100.0)
Monocytes Absolute: 1.1 10*3/uL — ABNORMAL HIGH (ref 0.1–1.0)
Monocytes Relative: 7 %
Neutro Abs: 12.2 10*3/uL — ABNORMAL HIGH (ref 1.7–7.7)
Neutrophils Relative %: 82 %
Platelet Count: 182 10*3/uL (ref 150–400)
RBC: 3.31 MIL/uL — ABNORMAL LOW (ref 4.22–5.81)
RDW: 17.1 % — ABNORMAL HIGH (ref 11.5–15.5)
WBC Count: 14.8 10*3/uL — ABNORMAL HIGH (ref 4.0–10.5)
nRBC: 0 % (ref 0.0–0.2)

## 2021-03-20 LAB — CMP (CANCER CENTER ONLY)
ALT: 15 U/L (ref 0–44)
AST: 15 U/L (ref 15–41)
Albumin: 3.9 g/dL (ref 3.5–5.0)
Alkaline Phosphatase: 135 U/L — ABNORMAL HIGH (ref 38–126)
Anion gap: 9 (ref 5–15)
BUN: 12 mg/dL (ref 6–20)
CO2: 26 mmol/L (ref 22–32)
Calcium: 9.7 mg/dL (ref 8.9–10.3)
Chloride: 103 mmol/L (ref 98–111)
Creatinine: 1.3 mg/dL — ABNORMAL HIGH (ref 0.61–1.24)
GFR, Estimated: >60 mL/min (ref >60)
Glucose, Bld: 115 mg/dL — ABNORMAL HIGH (ref 70–99)
Potassium: 4.4 mmol/L (ref 3.5–5.1)
Sodium: 138 mmol/L (ref 135–145)
Total Bilirubin: 0.4 mg/dL (ref 0.3–1.2)
Total Protein: 7.1 g/dL (ref 6.5–8.1)

## 2021-03-20 LAB — SARS CORONAVIRUS 2 (TAT 6-24 HRS): SARS Coronavirus 2: NEGATIVE

## 2021-03-20 NOTE — Telephone Encounter (Signed)
Sonia Side w/[employer], pts insurance carrier has left a message advising despite the approval for Pegfilgrastim, it is no longer covered by patients insurance. He indicated [employer] should cover it.  I have shared this message with Elliot Gault, who is covering for Drucie Opitz today.  Pt has also left this same message as well. I have advised the pt msg was received and that [place] also relayed this information.

## 2021-03-23 ENCOUNTER — Encounter (HOSPITAL_COMMUNITY): Payer: Self-pay | Admitting: Gastroenterology

## 2021-03-23 ENCOUNTER — Ambulatory Visit
Admission: RE | Admit: 2021-03-23 | Discharge: 2021-03-23 | Disposition: A | Discharge status: Home / Self Care | Payer: BC Managed Care – PPO | Source: Ambulatory Visit | Attending: Radiation Oncology | Admitting: Radiation Oncology

## 2021-03-23 ENCOUNTER — Other Ambulatory Visit: Payer: Self-pay

## 2021-03-23 DIAGNOSIS — C3411 Malignant neoplasm of upper lobe, right bronchus or lung: Secondary | ICD-10-CM | POA: Diagnosis not present

## 2021-03-24 ENCOUNTER — Ambulatory Visit
Admission: RE | Admit: 2021-03-24 | Discharge: 2021-03-24 | Disposition: A | Discharge status: Home / Self Care | Payer: BC Managed Care – PPO | Source: Ambulatory Visit | Attending: Radiation Oncology | Admitting: Radiation Oncology

## 2021-03-24 ENCOUNTER — Encounter: Payer: Self-pay | Admitting: Radiation Oncology

## 2021-03-24 DIAGNOSIS — C3411 Malignant neoplasm of upper lobe, right bronchus or lung: Secondary | ICD-10-CM | POA: Diagnosis not present

## 2021-03-24 DIAGNOSIS — Z87891 Personal history of nicotine dependence: Secondary | ICD-10-CM | POA: Diagnosis not present

## 2021-03-24 NOTE — Anesthesia Preprocedure Evaluation (Addendum)
Anesthesia Evaluation  Patient identified by MRN, date of birth, ID band Patient awake    Reviewed: Allergy & Precautions, NPO status , Patient's Chart, lab work & pertinent test results  History of Anesthesia Complications Negative for: history of anesthetic complications  Airway Mallampati: III  TM Distance: >3 FB Neck ROM: Full    Dental no notable dental hx. (+) Dental Advisory Given   Pulmonary former smoker,  Hx of Lung Ca: chemo/rad tx   Pulmonary exam normal        Cardiovascular negative cardio ROS   Rhythm:Regular Rate:Tachycardia     Neuro/Psych negative neurological ROS     GI/Hepatic Neg liver ROS, GERD  ,  Endo/Other  diabetes  Renal/GU negative Renal ROS     Musculoskeletal negative musculoskeletal ROS (+)   Abdominal   Peds  Hematology negative hematology ROS (+) anemia ,   Anesthesia Other Findings   Reproductive/Obstetrics                            Anesthesia Physical Anesthesia Plan  ASA: III  Anesthesia Plan: MAC   Post-op Pain Management:    Induction:   PONV Risk Score and Plan: Ondansetron and Propofol infusion  Airway Management Planned: Natural Airway  Additional Equipment:   Intra-op Plan:   Post-operative Plan:   Informed Consent: I have reviewed the patients History and Physical, chart, labs and discussed the procedure including the risks, benefits and alternatives for the proposed anesthesia with the patient or authorized representative who has indicated his/her understanding and acceptance.     Dental advisory given  Plan Discussed with: Anesthesiologist and CRNA  Anesthesia Plan Comments:        Anesthesia Quick Evaluation

## 2021-03-25 ENCOUNTER — Encounter (HOSPITAL_COMMUNITY)
Admission: RE | Disposition: A | Discharge status: Home / Self Care | Payer: Self-pay | Source: Home / Self Care | Attending: Gastroenterology

## 2021-03-25 ENCOUNTER — Ambulatory Visit: Payer: BC Managed Care – PPO | Admitting: Radiation Oncology

## 2021-03-25 ENCOUNTER — Ambulatory Visit (HOSPITAL_COMMUNITY): Payer: BC Managed Care – PPO | Admitting: Anesthesiology

## 2021-03-25 ENCOUNTER — Encounter (HOSPITAL_COMMUNITY): Payer: Self-pay | Admitting: Gastroenterology

## 2021-03-25 ENCOUNTER — Ambulatory Visit (HOSPITAL_COMMUNITY)
Admission: RE | Admit: 2021-03-25 | Discharge: 2021-03-25 | Disposition: A | Discharge status: Home / Self Care | Payer: BC Managed Care – PPO | Attending: Gastroenterology | Admitting: Gastroenterology

## 2021-03-25 ENCOUNTER — Other Ambulatory Visit: Payer: Self-pay

## 2021-03-25 DIAGNOSIS — Z87891 Personal history of nicotine dependence: Secondary | ICD-10-CM | POA: Diagnosis not present

## 2021-03-25 DIAGNOSIS — E785 Hyperlipidemia, unspecified: Secondary | ICD-10-CM | POA: Insufficient documentation

## 2021-03-25 DIAGNOSIS — R1310 Dysphagia, unspecified: Secondary | ICD-10-CM

## 2021-03-25 DIAGNOSIS — K2970 Gastritis, unspecified, without bleeding: Secondary | ICD-10-CM | POA: Diagnosis not present

## 2021-03-25 DIAGNOSIS — Z7982 Long term (current) use of aspirin: Secondary | ICD-10-CM | POA: Diagnosis not present

## 2021-03-25 DIAGNOSIS — R857 Abnormal histological findings in specimens from digestive organs and abdominal cavity: Secondary | ICD-10-CM | POA: Diagnosis not present

## 2021-03-25 DIAGNOSIS — Z79899 Other long term (current) drug therapy: Secondary | ICD-10-CM | POA: Diagnosis not present

## 2021-03-25 DIAGNOSIS — K222 Esophageal obstruction: Secondary | ICD-10-CM | POA: Diagnosis not present

## 2021-03-25 DIAGNOSIS — R1013 Epigastric pain: Secondary | ICD-10-CM | POA: Diagnosis not present

## 2021-03-25 DIAGNOSIS — Z791 Long term (current) use of non-steroidal anti-inflammatories (NSAID): Secondary | ICD-10-CM | POA: Diagnosis not present

## 2021-03-25 DIAGNOSIS — R112 Nausea with vomiting, unspecified: Secondary | ICD-10-CM

## 2021-03-25 DIAGNOSIS — Z888 Allergy status to other drugs, medicaments and biological substances status: Secondary | ICD-10-CM | POA: Diagnosis not present

## 2021-03-25 DIAGNOSIS — K2210 Ulcer of esophagus without bleeding: Secondary | ICD-10-CM | POA: Insufficient documentation

## 2021-03-25 DIAGNOSIS — K2990 Gastroduodenitis, unspecified, without bleeding: Secondary | ICD-10-CM

## 2021-03-25 DIAGNOSIS — E119 Type 2 diabetes mellitus without complications: Secondary | ICD-10-CM | POA: Diagnosis not present

## 2021-03-25 DIAGNOSIS — Z79890 Hormone replacement therapy (postmenopausal): Secondary | ICD-10-CM | POA: Insufficient documentation

## 2021-03-25 DIAGNOSIS — Z7984 Long term (current) use of oral hypoglycemic drugs: Secondary | ICD-10-CM | POA: Diagnosis not present

## 2021-03-25 DIAGNOSIS — K2100 Gastro-esophageal reflux disease with esophagitis, without bleeding: Secondary | ICD-10-CM | POA: Diagnosis not present

## 2021-03-25 DIAGNOSIS — C3490 Malignant neoplasm of unspecified part of unspecified bronchus or lung: Secondary | ICD-10-CM | POA: Diagnosis not present

## 2021-03-25 DIAGNOSIS — K2950 Unspecified chronic gastritis without bleeding: Secondary | ICD-10-CM | POA: Diagnosis not present

## 2021-03-25 DIAGNOSIS — K449 Diaphragmatic hernia without obstruction or gangrene: Secondary | ICD-10-CM | POA: Diagnosis not present

## 2021-03-25 HISTORY — DX: Presence of spectacles and contact lenses: Z97.3

## 2021-03-25 HISTORY — PX: BIOPSY: SHX5522

## 2021-03-25 HISTORY — PX: ESOPHAGEAL DILATION: SHX303

## 2021-03-25 HISTORY — PX: ESOPHAGOGASTRODUODENOSCOPY (EGD) WITH PROPOFOL: SHX5813

## 2021-03-25 LAB — GLUCOSE, CAPILLARY: Glucose-Capillary: 160 mg/dL — ABNORMAL HIGH (ref 70–99)

## 2021-03-25 SURGERY — ESOPHAGOGASTRODUODENOSCOPY (EGD) WITH PROPOFOL
Anesthesia: Monitor Anesthesia Care

## 2021-03-25 MED ORDER — LACTATED RINGERS IV SOLN
INTRAVENOUS | Status: AC | PRN
  Administered 2021-03-25: 10 mL/h via INTRAVENOUS

## 2021-03-25 MED ORDER — SUCRALFATE 1 GM/10ML PO SUSP: 1.0000 g | Freq: Four times a day (QID) | ORAL | 1 refills | Status: DC

## 2021-03-25 MED ORDER — SODIUM CHLORIDE 0.9 % IV SOLN: INTRAVENOUS | Status: DC

## 2021-03-25 MED ORDER — PANTOPRAZOLE SODIUM 40 MG PO TBEC: 40.0000 mg | DELAYED_RELEASE_TABLET | Freq: Two times a day (BID) | ORAL | 3 refills | Status: DC

## 2021-03-25 MED ORDER — SODIUM CHLORIDE 0.9 % IV SOLN: 1000.0000 mL | Freq: Once | INTRAVENOUS | Status: DC

## 2021-03-25 MED ORDER — PROPOFOL 500 MG/50ML IV EMUL
INTRAVENOUS | Status: DC | PRN
  Administered 2021-03-25: 150 ug/kg/min via INTRAVENOUS

## 2021-03-25 MED ORDER — PHENYLEPHRINE HCL (PRESSORS) 10 MG/ML IV SOLN
INTRAVENOUS | Status: DC | PRN
  Administered 2021-03-25: 120 ug via INTRAVENOUS

## 2021-03-25 SURGICAL SUPPLY — 14 items

## 2021-03-25 NOTE — Addendum Note (Signed)
Addendum  created 03/25/21 1218 by Duane Boston, MD   Clinical Note Signed

## 2021-03-25 NOTE — Interval H&P Note (Signed)
History and Physical Interval Note:  03/25/2021 9:24 AM  William Farrell  has presented today for surgery, with the diagnosis of dysphagia.  The various methods of treatment have been discussed with the patient and family. After consideration of risks, benefits and other options for treatment, the patient has consented to  Procedure(s) with comments: ESOPHAGOGASTRODUODENOSCOPY (EGD) WITH PROPOFOL (N/A) - dialation of stricture as a surgical intervention.  The patient's history has been reviewed, patient examined, no change in status, stable for surgery.  I have reviewed the patient's chart and labs.  Questions were answered to the patient's satisfaction.     Dominic Pea Saul Dorsi

## 2021-03-25 NOTE — Op Note (Signed)
[facility] Patient Name: William Farrell Procedure Date: 03/25/2021 MRN: [id number] Attending MD: Gerrit Heck , MD Date of Birth: May 19, 1961 CSN: [id number] Age: 60 Admit Type: Outpatient Procedure:                Upper GI endoscopy Indications:              Dysphagia, Chronic cough, Regurgitation Providers:                Gerrit Heck, MD, Dulcy Fanny, Tyrone Apple, Technician, Herbie Drape, CRNA Referring MD:              Medicines:                Monitored Anesthesia Care Complications:            No immediate complications. Estimated Blood Loss:     Estimated blood loss was minimal. Procedure:                Pre-Anesthesia Assessment:                           - Prior to the procedure, a History and Physical                            was performed, and patient medications and                            allergies were reviewed. The patient's tolerance of                            previous anesthesia was also reviewed. The risks                            and benefits of the procedure and the sedation                            options and risks were discussed with the patient.                            All questions were answered, and informed consent                            was obtained. Prior Anticoagulants: The patient has                            taken no previous anticoagulant or antiplatelet                            agents. ASA Grade Assessment: III - A patient with                            severe systemic disease. After reviewing the risks  and benefits, the patient was deemed in                            satisfactory condition to undergo the procedure.                           After obtaining informed consent, the endoscope was                            passed under direct vision. Throughout the                            procedure, the patient's blood pressure, pulse,  and                            oxygen saturations were monitored continuously. The                            GIF-H190 ([id number]) Olympus gastroscope was                            introduced through the mouth, and advanced to the                            second part of duodenum. The upper GI endoscopy was                            accomplished without difficulty. The patient                            tolerated the procedure well. Scope In: Scope Out: Findings:      One benign-appearing, intrinsic mild stenosis was found 40 cm from the       incisors. This stenosis measured 1 cm (in length). The stenosis was       traversed. A TTS dilator was passed through the scope. Dilation with a       15-16.5-18 mm balloon and an 18-19-20 mm balloon dilator was performed       to 20 mm. The dilation site was examined and showed mild mucosal       disruption and moderate improvement in luminal narrowing. Estimated       blood loss was minimal.      LA Grade C (one or more mucosal breaks continuous between tops of 2 or       more mucosal folds, less than 75% circumference) esophagitis with no       bleeding was found in the lower third of the esophagus. Biopsies were       taken with a cold forceps for histology. Estimated blood loss was       minimal.      A 2 cm hiatal hernia was present.      The upper third of the esophagus and middle third of the esophagus were       normal.      Localized mild inflammation characterized by congestion (edema) and       erythema was found in the gastric antrum. Biopsies were taken with a  cold forceps for Helicobacter pylori testing. Estimated blood loss was       minimal.      The gastric fundus and gastric body were normal.      The duodenal bulb, first portion of the duodenum and second portion of       the duodenum were normal. Impression:               - Benign-appearing esophageal stenosis. Dilated to                            a maximum of 20 mm  TTS balloon. 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 This area was biopsied.                           - LA Grade C reflux esophagitis with no bleeding.                            Biopsied.                           - 2 cm hiatal hernia.                           - Normal upper third of esophagus and middle third                            of esophagus.                           - Gastritis. Biopsied.                           - Normal gastric fundus and gastric body.                           - Normal duodenal bulb, first portion of the                            duodenum and second portion of the duodenum. Moderate Sedation:      Not Applicable - Patient had care per Anesthesia. Recommendation:           - Patient has a contact number available for                            emergencies. The signs and symptoms of potential                            delayed complications were discussed with the                            patient.  Return to normal activities tomorrow.                            Written discharge instructions were provided to the                            patient.                           - Soft diet today then advance slowly as tolerated                            tomorrow.                           - Continue present medications.                           - Await pathology results.                           - Use Protonix (pantoprazole) 40 mg PO BID for 8                            weeks, then reduce to 40 mg daily to continue                            control of reflux.                           - Use sucralfate suspension 1 gram PO QID for 4                            weeks.                           - Return to GI clinic in 2 months at appointment to                            be scheduled. Procedure Code(s):        --- Professional ---                            [phone], Esophagogastroduodenoscopy, flexible,                            transoral; with transendoscopic balloon dilation of                            esophagus (less than 30 mm diameter)                           43239, 59, Esophagogastroduodenoscopy, flexible,                            transoral; with biopsy, single or multiple Diagnosis Code(s):        ---  Professional ---                           K22.2, Esophageal obstruction                           K21.00, Gastro-esophageal reflux disease with                            esophagitis, without bleeding                           K44.9, Diaphragmatic hernia without obstruction or                            gangrene                           K29.70, Gastritis, unspecified, without bleeding                           R13.10, Dysphagia, unspecified                           R05, Cough                           R11.10, Vomiting, unspecified CPT copyright 2019 American Medical Association. All rights reserved. The codes documented in this report are preliminary and upon coder review may  be revised to meet current compliance requirements. Gerrit Heck, MD 03/25/2021 10:03:21 AM Number of Addenda: 0

## 2021-03-25 NOTE — Anesthesia Postprocedure Evaluation (Addendum)
Anesthesia Post Note  Patient: William Farrell  Procedure(s) Performed: ESOPHAGOGASTRODUODENOSCOPY (EGD) WITH PROPOFOL (N/A ) BIOPSY Balloon dilation wire-guided     Patient location during evaluation: Endoscopy Anesthesia Type: MAC Level of consciousness: awake and alert Pain management: pain level controlled Vital Signs Assessment: post-procedure vital signs reviewed and stable Respiratory status: spontaneous breathing and respiratory function stable Cardiovascular status: stable Postop Assessment: no apparent nausea or vomiting Anesthetic complications: no   No complications documented.  Last Vitals:  Vitals:   03/25/21 1020 03/25/21 1030  BP: 109/75 119/81  Pulse: 96 93  Resp: (!) 24 19  Temp: 36.5 C   SpO2: 100% 94%    Last Pain:  Vitals:   03/25/21 1030  TempSrc:   PainSc: 0-No pain                 [name] DANIEL

## 2021-03-25 NOTE — Discharge Instructions (Signed)
YOU HAD AN ENDOSCOPIC PROCEDURE TODAY: Refer to the procedure report and other information in the discharge instructions given to you for any specific questions about what was found during the examination. If this information does not answer your questions, please call [place] office at [phone] to clarify.   YOU SHOULD EXPECT: Some feelings of bloating in the abdomen. Passage of more gas than usual. Walking can help get rid of the air that was put into your GI tract during the procedure and reduce the bloating. If you had a lower endoscopy (such as a colonoscopy or flexible sigmoidoscopy) you may notice spotting of blood in your stool or on the toilet paper. Some abdominal soreness may be present for a day or two, also.  DIET: Your first meal following the procedure should be a light meal and then it is ok to progress to your normal diet. A half-sandwich or bowl of soup is an example of a good first meal. Heavy or fried foods are harder to digest and may make you feel nauseous or bloated. Drink plenty of fluids but you should avoid alcoholic beverages for 24 hours. If you had a esophageal dilation, please see attached instructions for diet.    ACTIVITY: Your care partner should take you home directly after the procedure. You should plan to take it easy, moving slowly for the rest of the day. You can resume normal activity the day after the procedure however YOU SHOULD NOT DRIVE, use power tools, machinery or perform tasks that involve climbing or major physical exertion for 24 hours (because of the sedation medicines used during the test).   SYMPTOMS TO REPORT IMMEDIATELY: A gastroenterologist can be reached at any hour. Please call [phone]  for any of the following symptoms:   Following upper endoscopy (EGD, EUS, ERCP, esophageal dilation) Vomiting of blood or coffee ground material  New, significant abdominal pain  New, significant chest pain or pain under the shoulder blades  Painful or  persistently difficult swallowing  New shortness of breath  Black, tarry-looking or red, bloody stools  FOLLOW UP:  If any biopsies were taken you will be contacted by phone or by letter within the next 1-3 weeks. Call [phone]  if you have not heard about the biopsies in 3 weeks.  Please also call with any specific questions about appointments or follow up tests.  

## 2021-03-25 NOTE — Transfer of Care (Signed)
Immediate Anesthesia Transfer of Care Note  Patient: William Farrell  Procedure(s) Performed: ESOPHAGOGASTRODUODENOSCOPY (EGD) WITH PROPOFOL (N/A ) BIOPSY Balloon dilation wire-guided  Patient Location: PACU  Anesthesia Type:MAC  Level of Consciousness: awake, alert  and oriented  Airway & Oxygen Therapy: Patient Spontanous Breathing and Patient connected to face mask oxygen  Post-op Assessment: Report given to RN and Post -op Vital signs reviewed and stable  Post vital signs: Reviewed and stable  Last Vitals:  Vitals Value Taken Time  BP    Temp    Pulse    Resp    SpO2      Last Pain:  Vitals:   03/25/21 0842  TempSrc: Oral  PainSc: 0-No pain         Complications: No complications documented.

## 2021-03-26 ENCOUNTER — Inpatient Hospital Stay: Payer: BC Managed Care – PPO

## 2021-03-26 ENCOUNTER — Other Ambulatory Visit: Payer: BC Managed Care – PPO

## 2021-03-26 ENCOUNTER — Inpatient Hospital Stay (HOSPITAL_BASED_OUTPATIENT_CLINIC_OR_DEPARTMENT_OTHER): Payer: BC Managed Care – PPO | Admitting: Internal Medicine

## 2021-03-26 ENCOUNTER — Encounter: Payer: Self-pay | Admitting: Internal Medicine

## 2021-03-26 ENCOUNTER — Telehealth: Payer: Self-pay | Admitting: General Surgery

## 2021-03-26 ENCOUNTER — Encounter: Payer: Self-pay | Admitting: Medical Oncology

## 2021-03-26 ENCOUNTER — Other Ambulatory Visit: Payer: Self-pay | Admitting: Internal Medicine

## 2021-03-26 VITALS — BP 126/79 | HR 110 | Temp 97.7°F | Resp 20 | Ht 72.0 in | Wt 229.9 lb

## 2021-03-26 DIAGNOSIS — Z5112 Encounter for antineoplastic immunotherapy: Secondary | ICD-10-CM | POA: Diagnosis not present

## 2021-03-26 DIAGNOSIS — Z95828 Presence of other vascular implants and grafts: Secondary | ICD-10-CM

## 2021-03-26 DIAGNOSIS — E860 Dehydration: Secondary | ICD-10-CM | POA: Diagnosis not present

## 2021-03-26 DIAGNOSIS — C3490 Malignant neoplasm of unspecified part of unspecified bronchus or lung: Secondary | ICD-10-CM

## 2021-03-26 DIAGNOSIS — Z7982 Long term (current) use of aspirin: Secondary | ICD-10-CM | POA: Diagnosis not present

## 2021-03-26 DIAGNOSIS — E119 Type 2 diabetes mellitus without complications: Secondary | ICD-10-CM | POA: Diagnosis not present

## 2021-03-26 DIAGNOSIS — C3411 Malignant neoplasm of upper lobe, right bronchus or lung: Secondary | ICD-10-CM | POA: Diagnosis not present

## 2021-03-26 DIAGNOSIS — Z5111 Encounter for antineoplastic chemotherapy: Secondary | ICD-10-CM

## 2021-03-26 DIAGNOSIS — Z79899 Other long term (current) drug therapy: Secondary | ICD-10-CM | POA: Diagnosis not present

## 2021-03-26 DIAGNOSIS — Z5189 Encounter for other specified aftercare: Secondary | ICD-10-CM | POA: Diagnosis not present

## 2021-03-26 LAB — CBC WITH DIFFERENTIAL (CANCER CENTER ONLY)
Abs Immature Granulocytes: 0.08 10*3/uL — ABNORMAL HIGH (ref 0.00–0.07)
Basophils Absolute: 0.1 10*3/uL (ref 0.0–0.1)
Basophils Relative: 0 %
Eosinophils Absolute: 0 10*3/uL (ref 0.0–0.5)
Eosinophils Relative: 0 %
HCT: 29.6 % — ABNORMAL LOW (ref 39.0–52.0)
Hemoglobin: 9.4 g/dL — ABNORMAL LOW (ref 13.0–17.0)
Immature Granulocytes: 1 %
Lymphocytes Relative: 4 %
Lymphs Abs: 0.7 10*3/uL (ref 0.7–4.0)
MCH: 29.9 pg (ref 26.0–34.0)
MCHC: 31.8 g/dL (ref 30.0–36.0)
MCV: 94.3 fL (ref 80.0–100.0)
Monocytes Absolute: 1.1 10*3/uL — ABNORMAL HIGH (ref 0.1–1.0)
Monocytes Relative: 6 %
Neutro Abs: 15.5 10*3/uL — ABNORMAL HIGH (ref 1.7–7.7)
Neutrophils Relative %: 89 %
Platelet Count: 238 10*3/uL (ref 150–400)
RBC: 3.14 MIL/uL — ABNORMAL LOW (ref 4.22–5.81)
RDW: 17.1 % — ABNORMAL HIGH (ref 11.5–15.5)
WBC Count: 17.4 10*3/uL — ABNORMAL HIGH (ref 4.0–10.5)
nRBC: 0 % (ref 0.0–0.2)

## 2021-03-26 LAB — CMP (CANCER CENTER ONLY)
ALT: 10 U/L (ref 0–44)
AST: 13 U/L — ABNORMAL LOW (ref 15–41)
Albumin: 3.3 g/dL — ABNORMAL LOW (ref 3.5–5.0)
Alkaline Phosphatase: 119 U/L (ref 38–126)
Anion gap: 12 (ref 5–15)
BUN: 20 mg/dL (ref 6–20)
CO2: 23 mmol/L (ref 22–32)
Calcium: 9.5 mg/dL (ref 8.9–10.3)
Chloride: 107 mmol/L (ref 98–111)
Creatinine: 1.35 mg/dL — ABNORMAL HIGH (ref 0.61–1.24)
GFR, Estimated: >60 mL/min (ref >60)
Glucose, Bld: 199 mg/dL — ABNORMAL HIGH (ref 70–99)
Potassium: 4.4 mmol/L (ref 3.5–5.1)
Sodium: 142 mmol/L (ref 135–145)
Total Bilirubin: 0.4 mg/dL (ref 0.3–1.2)
Total Protein: 7.2 g/dL (ref 6.5–8.1)

## 2021-03-26 LAB — SURGICAL PATHOLOGY

## 2021-03-26 LAB — TSH: TSH: 1.048 u[IU]/mL (ref 0.320–4.118)

## 2021-03-26 MED ORDER — ONDANSETRON HCL 4 MG/2ML IJ SOLN
8.0000 mg | Freq: Once | INTRAMUSCULAR | Status: AC
Start: 2021-03-26 — End: 2021-03-26
  Administered 2021-03-26: 8 mg via INTRAVENOUS

## 2021-03-26 MED ORDER — ONDANSETRON HCL 4 MG/2ML IJ SOLN
INTRAMUSCULAR | Status: AC
  Filled 2021-03-26: qty 4

## 2021-03-26 MED ORDER — SODIUM CHLORIDE 0.9 % IV SOLN
Freq: Once | INTRAVENOUS | Status: AC
  Filled 2021-03-26: qty 250

## 2021-03-26 MED ORDER — SODIUM CHLORIDE 0.9 % IV SOLN
75.0000 mg/m2 | Freq: Once | INTRAVENOUS | Status: AC
  Administered 2021-03-26: 180 mg via INTRAVENOUS
  Filled 2021-03-26: qty 18

## 2021-03-26 MED ORDER — DIPHENHYDRAMINE HCL 50 MG/ML IJ SOLN
INTRAMUSCULAR | Status: AC
  Filled 2021-03-26: qty 1

## 2021-03-26 MED ORDER — SODIUM CHLORIDE 0.9% FLUSH
10.0000 mL | Freq: Once | INTRAVENOUS | Status: AC
  Administered 2021-03-26: 10 mL
  Filled 2021-03-26: qty 10

## 2021-03-26 MED ORDER — DIPHENHYDRAMINE HCL 50 MG/ML IJ SOLN
50.0000 mg | Freq: Once | INTRAMUSCULAR | Status: AC
Start: 2021-03-26 — End: 2021-03-26
  Administered 2021-03-26: 50 mg via INTRAVENOUS

## 2021-03-26 MED ORDER — SODIUM CHLORIDE 0.9 % IV SOLN
10.0000 mg/kg | Freq: Once | INTRAVENOUS | Status: AC
  Administered 2021-03-26: 1100 mg via INTRAVENOUS
  Filled 2021-03-26: qty 100

## 2021-03-26 MED ORDER — ACETAMINOPHEN 325 MG PO TABS
650.0000 mg | ORAL_TABLET | Freq: Once | ORAL | Status: AC
  Administered 2021-03-26: 650 mg via ORAL

## 2021-03-26 MED ORDER — HEPARIN SOD (PORK) LOCK FLUSH 100 UNIT/ML IV SOLN
500.0000 [IU] | Freq: Once | INTRAVENOUS | Status: AC | PRN
  Administered 2021-03-26: 500 [IU]
  Filled 2021-03-26: qty 5

## 2021-03-26 MED ORDER — SODIUM CHLORIDE 0.9 % IV SOLN
INTRAVENOUS | Status: AC
Start: 2021-03-26 — End: 2021-03-26
  Filled 2021-03-26 (×2): qty 250

## 2021-03-26 MED ORDER — SODIUM CHLORIDE 0.9 % IV SOLN
10.0000 mg | Freq: Once | INTRAVENOUS | Status: AC
  Administered 2021-03-26: 10 mg via INTRAVENOUS
  Filled 2021-03-26: qty 10

## 2021-03-26 MED ORDER — ACETAMINOPHEN 325 MG PO TABS
ORAL_TABLET | ORAL | Status: AC
  Filled 2021-03-26: qty 2

## 2021-03-26 MED ORDER — SODIUM CHLORIDE 0.9% FLUSH
10.0000 mL | INTRAVENOUS | Status: DC | PRN
  Administered 2021-03-26: 10 mL
  Filled 2021-03-26: qty 10

## 2021-03-26 NOTE — Progress Notes (Signed)
Per Arnoldo Lenis, RN, per [facility], ok to treat with HR today. PT receiving 1L NS in infusion with treatment

## 2021-03-26 NOTE — Progress Notes (Signed)
Per Dr Julien Nordmann , it is okay to treat pt today with Taxotere and Cyramza and heart rate of 110/minute. Pt to receive IVF today .

## 2021-03-26 NOTE — Patient Instructions (Signed)
[facility]  Discharge Instructions: Thank you for choosing [facility] to provide your oncology and hematology care.   If you have a lab appointment with the [facility], please go directly to the [facility] and check in at the registration area.   Wear comfortable clothing and clothing appropriate for easy access to any Portacath or PICC line.   We strive to give you quality time with your provider. You may need to reschedule your appointment if you arrive late (15 or more minutes).  Arriving late affects you and other patients whose appointments are after yours.  Also, if you miss three or more appointments without notifying the office, you may be dismissed from the clinic at the provider's discretion.      For prescription refill requests, have your pharmacy contact our office and allow 72 hours for refills to be completed.    Today you received the following chemotherapy and/or immunotherapy agents Cyramza and Taxotere      To help prevent nausea and vomiting after your treatment, we encourage you to take your nausea medication as directed.  BELOW ARE SYMPTOMS THAT SHOULD BE REPORTED IMMEDIATELY: . *FEVER GREATER THAN 100.4 F (38 C) OR HIGHER . *CHILLS OR SWEATING . *NAUSEA AND VOMITING THAT IS NOT CONTROLLED WITH YOUR NAUSEA MEDICATION . *UNUSUAL SHORTNESS OF BREATH . *UNUSUAL BRUISING OR BLEEDING . *URINARY PROBLEMS (pain or burning when urinating, or frequent urination) . *BOWEL PROBLEMS (unusual diarrhea, constipation, pain near the anus) . TENDERNESS IN MOUTH AND THROAT WITH OR WITHOUT PRESENCE OF ULCERS (sore throat, sores in mouth, or a toothache) . UNUSUAL RASH, SWELLING OR PAIN  . UNUSUAL VAGINAL DISCHARGE OR ITCHING   Items with * indicate a potential emergency and should be followed up as soon as possible or go to the Emergency Department if any problems should occur.  Please show the CHEMOTHERAPY ALERT CARD or  IMMUNOTHERAPY ALERT CARD at check-in to the Emergency Department and triage nurse.  Should you have questions after your visit or need to cancel or reschedule your appointment, please contact [facility]  Dept: [phone]  and follow the prompts.  Office hours are 8:00 a.m. to 4:30 p.m. Monday - Friday. Please note that voicemails left after 4:00 p.m. may not be returned until the following business day.  We are closed weekends and major holidays. You have access to a nurse at all times for urgent questions. Please call the main number to the clinic Dept: [phone] and follow the prompts.   For any non-urgent questions, you may also contact your provider using MyChart. We now offer e-Visits for anyone 32 and older to request care online for non-urgent symptoms. For details visit [email or web address].   Also download the MyChart app! Go to the app store, search "MyChart", open the app, select [facility], and log in with your MyChart username and password.  Due to Covid, a mask is required upon entering the hospital/clinic. If you do not have a mask, one will be given to you upon arrival. For doctor visits, patients may have 1 support person aged 45 or older with them. For treatment visits, patients cannot have anyone with them due to current Covid guidelines and our immunocompromised population.

## 2021-03-26 NOTE — Telephone Encounter (Signed)
Left a voicemail for the patient to call regarding his EGD results.

## 2021-03-26 NOTE — Telephone Encounter (Signed)
-----   Message from [place], DO sent at 03/26/2021  1:10 PM EDT ----- The biopsies taken at the time of upper endoscopy were notable for the following: -The biopsies taken from your stomach were notable for gastritis (inflammation), but there was no evidence of Helicobacter pylori infection.  -The biopsies taken from the esophagus demonstrate ulcer with inflamed granulation tissue.  Per the Pathologist, these kinds of changes can be seen with severe reflux but he favors this to be due to radiation injury.  Will continue to treat with high-dose acid suppression therapy (pantoprazole) and Carafate (sucralfate) as prescribed.  Plan for follow-up in the GI clinic, and can potentially do another upper endoscopy in 8+ weeks to look for appropriate healing.

## 2021-03-26 NOTE — Patient Instructions (Signed)
Implanted Port Insertion, Care After This sheet gives you information about how to care for yourself after your procedure. Your health care provider may also give you more specific instructions. If you have problems or questions, contact your health care provider. What can I expect after the procedure? After the procedure, it is common to have:  Discomfort at the port insertion site.  Bruising on the skin over the port. This should improve over 3-4 days. Follow these instructions at home: Port care  After your port is placed, you will get a manufacturer's information card. The card has information about your port. Keep this card with you at all times.  Take care of the port as told by your health care provider. Ask your health care provider if you or a family member can get training for taking care of the port at home. A home health care nurse may also take care of the port.  Make sure to remember what type of port you have. Incision care  Follow instructions from your health care provider about how to take care of your port insertion site. Make sure you: ? Wash your hands with soap and water before and after you change your bandage (dressing). If soap and water are not available, use hand sanitizer. ? Change your dressing as told by your health care provider. ? Leave stitches (sutures), skin glue, or adhesive strips in place. These skin closures may need to stay in place for 2 weeks or longer. If adhesive strip edges start to loosen and curl up, you may trim the loose edges. Do not remove adhesive strips completely unless your health care provider tells you to do that.  Check your port insertion site every day for signs of infection. Check for: ? Redness, swelling, or pain. ? Fluid or blood. ? Warmth. ? Pus or a bad smell.      Activity  Return to your normal activities as told by your health care provider. Ask your health care provider what activities are safe for you.  Do not  lift anything that is heavier than 10 lb (4.5 kg), or the limit that you are told, until your health care provider says that it is safe. General instructions  Take over-the-counter and prescription medicines only as told by your health care provider.  Do not take baths, swim, or use a hot tub until your health care provider approves. Ask your health care provider if you may take showers. You may only be allowed to take sponge baths.  Do not drive for 24 hours if you were given a sedative during your procedure.  Wear a medical alert bracelet in case of an emergency. This will tell any health care providers that you have a port.  Keep all follow-up visits as told by your health care provider. This is important. Contact a health care provider if:  You cannot flush your port with saline as directed, or you cannot draw blood from the port.  You have a fever or chills.  You have redness, swelling, or pain around your port insertion site.  You have fluid or blood coming from your port insertion site.  Your port insertion site feels warm to the touch.  You have pus or a bad smell coming from the port insertion site. Get help right away if:  You have chest pain or shortness of breath.  You have bleeding from your port that you cannot control. Summary  Take care of the port as told by your   health care provider. Keep the manufacturer's information card with you at all times.  Change your dressing as told by your health care provider.  Contact a health care provider if you have a fever or chills or if you have redness, swelling, or pain around your port insertion site.  Keep all follow-up visits as told by your health care provider. This information is not intended to replace advice given to you by your health care provider. Make sure you discuss any questions you have with your health care provider. Document Revised: 05/30/2018 Document Reviewed: 05/30/2018 Elsevier Patient Education   2021 Elsevier Inc.  

## 2021-03-26 NOTE — Progress Notes (Signed)
[name] [phone]   [phone]  OFFICE PROGRESS NOTE  [name], DO [place] [address] [address]  DIAGNOSIS: Recurrent/progressive non-small cell lung cancer initially diagnosed as stage IIIA(T2a, N2, M0) non-small cell lung cancer, squamous cell carcinoma diagnosed in April 2021, presented with right upper lobe/suprahilar lung mass in addition to right paratracheal and subcarinal lymphadenopathy.  Molecular Studies by [facility]: No actionable mutations  PRIOR THERAPY:  1) Concurrent chemoradiation with weekly carboplatin for AUC of 2 and paclitaxel 45 NG/M2. First dose on Mar 31, 2020. Status post 7 cycles. Last dose was given on 05/12/2020 with partial response. 2) Consolidation immunotherapy with durvalumab 1500 mg IV every 4 weeks. First dose May 12, 2020.Status post 3 cycles. 3) Systemic chemotherapy with carboplatin for an AUC of 5, paclitaxel 175 mg/m2, and Keytruda 200 mg IV every 3 weeks. First dose expected on 10/02/20.   Status post 3 cycles.  Last dose was given on November 13, 2020. 4) SBRT to the enlarging right upper lobe lung nodules under the care of Dr. Lisbeth Renshaw.  CURRENT THERAPY:  Second line systemic chemotherapy with docetaxel 75 Mg/M2 and Cyramza 10 mg/KG every 3 weeks with Neulasta support.  First dose December 11, 2020.  Status post 5 cycles.   INTERVAL HISTORY: William Farrell 60 y.o. male returns to the clinic today for follow-up visit accompanied by his wife.  The patient is feeling fine today with no concerning complaints except for mild fatigue.  He underwent esophageal dilatation yesterday and felt much better.  He was found to have acid reflux as well as suspicious H. pylori.  He denied having any current chest pain but continues to have shortness of breath with exertion and mild cough with no hemoptysis.  He denied having any fever or chills.  He has no nausea, vomiting, diarrhea  or constipation.  He has no headache or visual changes.  He is here today for evaluation before starting cycle #6 of his treatment.  His insurance has denied his Neulasta injection in the office and requested to be delivered to home with personal injection.  MEDICAL HISTORY: Past Medical History:  Diagnosis Date  . Asthma    as a child  . Diabetes ([facility])   . Diverticulitis   . GERD (gastroesophageal reflux disease)   . High cholesterol   . History of kidney stones   . Lung cancer ([facility])   . Wears glasses     ALLERGIES:  is allergic to sitagliptin.  MEDICATIONS:  Current Outpatient Medications  Medication Sig Dispense Refill  . acetaminophen (TYLENOL) 500 MG tablet Take 1,000 mg by mouth every 6 (six) hours as needed for mild pain.    Marland Kitchen albuterol (VENTOLIN HFA) 108 (90 Base) MCG/ACT inhaler Inhale 1-2 puffs into the lungs every 4 (four) hours as needed for wheezing or shortness of breath. 18 g 99  . aspirin EC 81 MG tablet Take 81 mg by mouth at bedtime.     Marland Kitchen atorvastatin (LIPITOR) 40 MG tablet TAKE 1 TABLET DAILY (Patient taking differently: Take 40 mg by mouth daily.) 90 tablet 3  . calcium carbonate (TUMS - DOSED IN MG ELEMENTAL CALCIUM) 500 MG chewable tablet Chew 2 tablets by mouth 3 (three) times daily as needed for indigestion or heartburn.    . dexamethasone (DECADRON) 4 MG tablet TAKE 1 TABLET BY MOUTH TWICE DAILY THE DAY BEFORE, DAY OF AND DAY AFTER CHEMOTHERAPY EVERY 3 WEEKS. (Patient  taking differently: Take 4 mg by mouth See admin instructions. TAKE 1 TABLET BY MOUTH TWICE DAILY THE DAY BEFORE, DAY OF AND DAY AFTER CHEMOTHERAPY EVERY 3 WEEKS.) 40 tablet 0  . Ferrous Fumarate-Vitamin C ER 65-25 MG TBCR Take 1 tablet by mouth daily.    Marland Kitchen glipiZIDE (GLUCOTROL) 5 MG tablet Take 1 tablet (5 mg total) by mouth 2 (two) times daily before a meal. 180 tablet 3  . Lancets (ONETOUCH DELICA PLUS [id number]) MISC USE AS DIRECTED UP TO FOUR TIMES A DAY 100 each 13  . levothyroxine  (SYNTHROID) 75 MCG tablet Take 1 tablet (75 mcg total) by mouth daily before breakfast. 30 tablet 1  . lidocaine-prilocaine (EMLA) cream Apply 1 application topically as needed. (Patient taking differently: Apply 1 application topically as needed (port access).) 30 g 2  . loratadine (CLARITIN) 10 MG tablet Take 10 mg by mouth daily as needed for allergies (after chemo shot).    . meloxicam (MOBIC) 15 MG tablet TAKE 1 TABLET DAILY (Patient taking differently: Take 15 mg by mouth daily.) 90 tablet 3  . metFORMIN (GLUCOPHAGE) 1000 MG tablet TAKE 1 TABLET TWICE A DAY WITH MEALS (Patient taking differently: Take 1,000 mg by mouth 2 (two) times daily with a meal.) 180 tablet 3  . ondansetron (ZOFRAN) 8 MG tablet TAKE 1 TABLET BY MOUTH EVERY 8 HOURS AS NEEDED FOR NAUSEA AND VOMITING (Patient taking differently: Take 8 mg by mouth 2 (two) times daily as needed for nausea or vomiting.) 9 tablet 2  . ONETOUCH ULTRA test strip USE AS INSTRUCTED UP TO FOUR TIMES A DAY 100 strip 13  . pantoprazole (PROTONIX) 40 MG tablet Take 1 tablet (40 mg total) by mouth 2 (two) times daily. 180 tablet 3  . Semaglutide,0.25 or 0.5MG /DOS, (OZEMPIC, 0.25 OR 0.5 MG/DOSE,) 2 MG/1.5ML SOPN Inject 0.5 mg into the skin once a week. 7.5 mL 1  . sucralfate (CARAFATE) 1 GM/10ML suspension Take 10 mLs (1 g total) by mouth 4 (four) times daily. 420 mL 1  . temazepam (RESTORIL) 15 MG capsule Take 1 capsule (15 mg total) by mouth at bedtime as needed for sleep. 30 capsule 0  . trolamine salicylate (ASPERCREME) 10 % cream Apply 1 application topically 2 (two) times daily as needed for muscle pain.      No current facility-administered medications for this visit.    SURGICAL HISTORY:  Past Surgical History:  Procedure Laterality Date  . BIOPSY  03/25/2021   Procedure: BIOPSY;  Surgeon: Lavena Bullion, DO;  Location: [facility];  Service: Gastroenterology;;  . broken bone repair    . BRONCHIAL BIOPSY  03/11/2020   Procedure:  BRONCHIAL BIOPSIES;  Surgeon: Garner Nash, DO;  Location: [facility];  Service: Pulmonary;;  . COLONOSCOPY     around 56. [place]  03/11/2020   Procedure: CRYOTHERAPY;  Surgeon: Garner Nash, DO;  Location: [facility];  Service: Pulmonary;;  . ESOPHAGEAL DILATION  03/25/2021   Procedure: ESOPHAGEAL DILATION;  Surgeon: Lavena Bullion, DO;  Location: [facility];  Service: Gastroenterology;;  . ESOPHAGOGASTRODUODENOSCOPY (EGD) WITH PROPOFOL N/A 03/25/2021   Procedure: ESOPHAGOGASTRODUODENOSCOPY (EGD) WITH PROPOFOL;  Surgeon: Lavena Bullion, DO;  Location: [facility];  Service: Gastroenterology;  Laterality: N/A;  dialation of stricture  . FINE NEEDLE ASPIRATION  03/11/2020   Procedure: FINE NEEDLE ASPIRATION (FNA) LINEAR;  Surgeon: Garner Nash, DO;  Location: [facility];  Service: Pulmonary;;  . IR IMAGING GUIDED PORT INSERTION  10/16/2020  . KNEE ARTHROSCOPY WITH ANTERIOR CRUCIATE LIGAMENT (ACL) REPAIR     x 2  . VIDEO BRONCHOSCOPY WITH ENDOBRONCHIAL ULTRASOUND N/A 03/11/2020   Procedure: VIDEO BRONCHOSCOPY WITH ENDOBRONCHIAL ULTRASOUND;  Surgeon: Garner Nash, DO;  Location: [facility];  Service: Pulmonary;  Laterality: N/A;    REVIEW OF SYSTEMS:  A comprehensive review of systems was negative except for: Constitutional: positive for fatigue Respiratory: positive for cough and dyspnea on exertion Gastrointestinal: positive for dyspepsia   PHYSICAL EXAMINATION: General appearance: alert, cooperative, fatigued and no distress Head: Normocephalic, without obvious abnormality, atraumatic Neck: no adenopathy, no JVD, supple, symmetrical, trachea midline and thyroid not enlarged, symmetric, no tenderness/mass/nodules Lymph nodes: Cervical, supraclavicular, and axillary nodes normal. Resp: clear to auscultation bilaterally Back: symmetric, no curvature. ROM normal. No CVA tenderness. Cardio: regular rate and rhythm, S1, S2 normal, no murmur,  click, rub or gallop GI: soft, non-tender; bowel sounds normal; no masses,  no organomegaly Extremities: extremities normal, atraumatic, no cyanosis or edema  ECOG PERFORMANCE STATUS: 1 - Symptomatic but completely ambulatory  Blood pressure 126/79, pulse (!) 110, temperature 97.7 F (36.5 C), temperature source Tympanic, resp. rate 20, height 6' (1.829 m), weight 229 lb 14.4 oz (104.3 kg), SpO2 98 %.  LABORATORY DATA: Lab Results  Component Value Date   WBC 17.4 (H) 03/26/2021   HGB 9.4 (L) 03/26/2021   HCT 29.6 (L) 03/26/2021   MCV 94.3 03/26/2021   PLT 238 03/26/2021      Chemistry      Component Value Date/Time   NA 138 03/20/2021 0848   K 4.4 03/20/2021 0848   CL 103 03/20/2021 0848   CO2 26 03/20/2021 0848   BUN 12 03/20/2021 0848   CREATININE 1.30 (H) 03/20/2021 0848   CREATININE 1.10 01/14/2020 0859      Component Value Date/Time   CALCIUM 9.7 03/20/2021 0848   ALKPHOS 135 (H) 03/20/2021 0848   AST 15 03/20/2021 0848   ALT 15 03/20/2021 0848   BILITOT 0.4 03/20/2021 0848       RADIOGRAPHIC STUDIES: No results found.  ASSESSMENT AND PLAN: This is a 60 years old white male was recently diagnosed with stage IIIa non-small cell lung cancer, squamous cell carcinoma in April 2021 presented with right upper lobe/suprahilar lung mass in addition to right paratracheal and subcarinal lymphadenopathy. The patient completed a course of concurrent chemoradiation with weekly carboplatin for AUC of 2 and paclitaxel 45 MG/M2.  He is status post 7 cycles.  He tolerated the previous course of his treatment well except for mild dysphagia and odynophagia. He underwent consolidation treatment with immunotherapy with Imfinzi 1500 mg IV every 4 weeks.  Status post 3 cycles.  The patient tolerated the treatment well but unfortunately he has evidence for disease progression after cycle #3. He is started first-line treatment with chemotherapy with carboplatin for AUC of 5, paclitaxel 175  mg/M2 and Keytruda 200 mg IV every 3 weeks.  Status post 3 cycles.  He has been tolerating this treatment well with no concerning adverse effects except for mild fatigue. The patient had repeat CT scan of the chest, abdomen pelvis performed recently.  I personally and independently reviewed the scan images and discussed the result and showed the images to the patient today. Unfortunately his scan showed enlarging nodules and masses in the chest with enlarging lymph nodes.  This is definitely highly suspicious for disease progression but to the progression on immunotherapy could not be completely excluded. He is currently undergoing  palliative systemic chemotherapy with second line docetaxel 75 Mg/M2 and Cyramza 10 mg/KG every 3 weeks with Neulasta support.  Status post 5 cycles.   The patient continues to tolerate his treatment well.  He also underwent palliative radiotherapy to the enlarging right upper lobe lung mass under the care of Dr. Lisbeth Renshaw. I recommended for the patient to proceed with cycle #6 today as planned. I will see him back for follow-up visit in 3 weeks for evaluation with repeat CT scan of the chest, abdomen pelvis for restaging of his disease. For the diabetes mellitus he is currently on Ozempic and he will follow-up with his primary care physician for regulation of his medication. For the insomnia, he will continue on Restoril 15 mg p.o. nightly as needed. He was advised to call immediately if he has any concerning symptoms in the interval. The patient voices understanding of current disease status and treatment options and is in agreement with the current care plan. All questions were answered. The patient knows to call the clinic with any problems, questions or concerns. We can certainly see the patient much sooner if necessary.  Disclaimer: This note was dictated with voice recognition software. Similar sounding words can inadvertently be transcribed and may not be corrected upon  review.

## 2021-03-27 ENCOUNTER — Other Ambulatory Visit: Payer: BC Managed Care – PPO

## 2021-03-27 ENCOUNTER — Ambulatory Visit: Payer: BC Managed Care – PPO

## 2021-03-27 ENCOUNTER — Inpatient Hospital Stay: Payer: BC Managed Care – PPO

## 2021-03-27 ENCOUNTER — Other Ambulatory Visit: Payer: Self-pay

## 2021-03-27 ENCOUNTER — Ambulatory Visit: Payer: BC Managed Care – PPO | Admitting: Radiation Oncology

## 2021-03-27 ENCOUNTER — Encounter: Payer: Self-pay | Admitting: Osteopathic Medicine

## 2021-03-27 VITALS — BP 141/75 | HR 104 | Temp 98.3°F | Resp 20

## 2021-03-27 DIAGNOSIS — C3411 Malignant neoplasm of upper lobe, right bronchus or lung: Secondary | ICD-10-CM

## 2021-03-27 DIAGNOSIS — Z5189 Encounter for other specified aftercare: Secondary | ICD-10-CM | POA: Diagnosis not present

## 2021-03-27 DIAGNOSIS — Z79899 Other long term (current) drug therapy: Secondary | ICD-10-CM | POA: Diagnosis not present

## 2021-03-27 DIAGNOSIS — Z5111 Encounter for antineoplastic chemotherapy: Secondary | ICD-10-CM | POA: Diagnosis not present

## 2021-03-27 DIAGNOSIS — Z5112 Encounter for antineoplastic immunotherapy: Secondary | ICD-10-CM | POA: Diagnosis not present

## 2021-03-27 DIAGNOSIS — E119 Type 2 diabetes mellitus without complications: Secondary | ICD-10-CM | POA: Diagnosis not present

## 2021-03-27 DIAGNOSIS — Z7982 Long term (current) use of aspirin: Secondary | ICD-10-CM | POA: Diagnosis not present

## 2021-03-27 MED ORDER — PEGFILGRASTIM-CBQV 6 MG/0.6ML ~~LOC~~ SOSY
PREFILLED_SYRINGE | SUBCUTANEOUS | Status: AC
  Filled 2021-03-27: qty 0.6

## 2021-03-27 MED ORDER — PEGFILGRASTIM-CBQV 6 MG/0.6ML ~~LOC~~ SOSY
6.0000 mg | PREFILLED_SYRINGE | Freq: Once | SUBCUTANEOUS | Status: AC
  Administered 2021-03-27: 6 mg via SUBCUTANEOUS

## 2021-03-27 NOTE — Telephone Encounter (Signed)
-----   Message from [place], DO sent at 03/26/2021  1:10 PM EDT ----- The biopsies taken at the time of upper endoscopy were notable for the following: -The biopsies taken from your stomach were notable for gastritis (inflammation), but there was no evidence of Helicobacter pylori infection.  -The biopsies taken from the esophagus demonstrate ulcer with inflamed granulation tissue.  Per the Pathologist, these kinds of changes can be seen with severe reflux but he favors this to be due to radiation injury.  Will continue to treat with high-dose acid suppression therapy (pantoprazole) and Carafate (sucralfate) as prescribed.  Plan for follow-up in the GI clinic, and can potentially do another upper endoscopy in 8+ weeks to look for appropriate healing.

## 2021-03-27 NOTE — Telephone Encounter (Signed)
Notified the patient of results, scheduled to come in to clinic 04/29/2021 for a follow up

## 2021-03-28 ENCOUNTER — Inpatient Hospital Stay: Payer: BC Managed Care – PPO

## 2021-03-31 ENCOUNTER — Encounter: Payer: Self-pay | Admitting: Internal Medicine

## 2021-04-01 ENCOUNTER — Encounter: Payer: Self-pay | Admitting: Internal Medicine

## 2021-04-02 ENCOUNTER — Other Ambulatory Visit: Payer: Self-pay

## 2021-04-02 ENCOUNTER — Inpatient Hospital Stay: Payer: BC Managed Care – PPO

## 2021-04-02 ENCOUNTER — Encounter: Payer: Self-pay | Admitting: Internal Medicine

## 2021-04-02 DIAGNOSIS — Z5189 Encounter for other specified aftercare: Secondary | ICD-10-CM | POA: Diagnosis not present

## 2021-04-02 DIAGNOSIS — Z79899 Other long term (current) drug therapy: Secondary | ICD-10-CM | POA: Diagnosis not present

## 2021-04-02 DIAGNOSIS — E119 Type 2 diabetes mellitus without complications: Secondary | ICD-10-CM | POA: Diagnosis not present

## 2021-04-02 DIAGNOSIS — Z5112 Encounter for antineoplastic immunotherapy: Secondary | ICD-10-CM | POA: Diagnosis not present

## 2021-04-02 DIAGNOSIS — Z5111 Encounter for antineoplastic chemotherapy: Secondary | ICD-10-CM | POA: Diagnosis not present

## 2021-04-02 DIAGNOSIS — C3411 Malignant neoplasm of upper lobe, right bronchus or lung: Secondary | ICD-10-CM

## 2021-04-02 DIAGNOSIS — Z7982 Long term (current) use of aspirin: Secondary | ICD-10-CM | POA: Diagnosis not present

## 2021-04-02 LAB — CBC WITH DIFFERENTIAL (CANCER CENTER ONLY)
Abs Immature Granulocytes: 0.12 10*3/uL — ABNORMAL HIGH (ref 0.00–0.07)
Basophils Absolute: 0.1 10*3/uL (ref 0.0–0.1)
Basophils Relative: 1 %
Eosinophils Absolute: 0.1 10*3/uL (ref 0.0–0.5)
Eosinophils Relative: 2 %
HCT: 29.2 % — ABNORMAL LOW (ref 39.0–52.0)
Hemoglobin: 9.6 g/dL — ABNORMAL LOW (ref 13.0–17.0)
Immature Granulocytes: 2 %
Lymphocytes Relative: 12 %
Lymphs Abs: 0.9 10*3/uL (ref 0.7–4.0)
MCH: 30.5 pg (ref 26.0–34.0)
MCHC: 32.9 g/dL (ref 30.0–36.0)
MCV: 92.7 fL (ref 80.0–100.0)
Monocytes Absolute: 1.1 10*3/uL — ABNORMAL HIGH (ref 0.1–1.0)
Monocytes Relative: 15 %
Neutro Abs: 5.2 10*3/uL (ref 1.7–7.7)
Neutrophils Relative %: 68 %
Platelet Count: 163 10*3/uL (ref 150–400)
RBC: 3.15 MIL/uL — ABNORMAL LOW (ref 4.22–5.81)
RDW: 16.9 % — ABNORMAL HIGH (ref 11.5–15.5)
WBC Count: 7.5 10*3/uL (ref 4.0–10.5)
nRBC: 0.4 % — ABNORMAL HIGH (ref 0.0–0.2)

## 2021-04-02 LAB — CMP (CANCER CENTER ONLY)
ALT: 18 U/L (ref 0–44)
AST: 18 U/L (ref 15–41)
Albumin: 3.7 g/dL (ref 3.5–5.0)
Alkaline Phosphatase: 140 U/L — ABNORMAL HIGH (ref 38–126)
Anion gap: 10 (ref 5–15)
BUN: 16 mg/dL (ref 6–20)
CO2: 25 mmol/L (ref 22–32)
Calcium: 9.7 mg/dL (ref 8.9–10.3)
Chloride: 104 mmol/L (ref 98–111)
Creatinine: 1.15 mg/dL (ref 0.61–1.24)
GFR, Estimated: >60 mL/min (ref >60)
Glucose, Bld: 124 mg/dL — ABNORMAL HIGH (ref 70–99)
Potassium: 4 mmol/L (ref 3.5–5.1)
Sodium: 139 mmol/L (ref 135–145)
Total Bilirubin: 0.5 mg/dL (ref 0.3–1.2)
Total Protein: 6.7 g/dL (ref 6.5–8.1)

## 2021-04-02 NOTE — Progress Notes (Signed)
  Patient Name: William Farrell MRN: [id number] DOB: 1961/03/07 Referring Physician: Curt Bears (Profile Not Attached) Date of Service: 03/24/2021 [facility], [facility]                                                        End Of Treatment Note  Diagnoses: C34.11-Malignant neoplasm of upper lobe, right bronchus or lung  Cancer Staging:  Progressive Stage IIIA, cT2aN2M0, NSCLC, squamous cell carcinoma of the RUL.  Intent: Curative  Radiation Treatment Dates: 03/17/2021 through 03/24/2021 Site Technique Total Dose (Gy) Dose per Fx (Gy) Completed Fx Beam Energies  Lung, Right: Lung_RUL IMRT 48/48 12 4/4 6XFFF   Narrative: The patient tolerated radiation therapy relatively well. The patient has had dysphagia and was able to undergo evaluation with GI and have esophageal dilation. Regarding radiation however he tolerated treatment well.   Plan: The patient will receive a call in about one month from the radiation oncology department. He will continue follow up with Dr. Julien Nordmann as well.   ________________________________________________    Carola Rhine, [facility]

## 2021-04-03 ENCOUNTER — Other Ambulatory Visit: Payer: Self-pay | Admitting: Internal Medicine

## 2021-04-03 DIAGNOSIS — R112 Nausea with vomiting, unspecified: Secondary | ICD-10-CM

## 2021-04-05 ENCOUNTER — Other Ambulatory Visit: Payer: Self-pay | Admitting: Physician Assistant

## 2021-04-05 DIAGNOSIS — E039 Hypothyroidism, unspecified: Secondary | ICD-10-CM

## 2021-04-08 ENCOUNTER — Encounter: Payer: Self-pay | Admitting: Adult Health

## 2021-04-08 ENCOUNTER — Ambulatory Visit (INDEPENDENT_AMBULATORY_CARE_PROVIDER_SITE_OTHER): Payer: BC Managed Care – PPO | Admitting: Adult Health

## 2021-04-08 ENCOUNTER — Ambulatory Visit (INDEPENDENT_AMBULATORY_CARE_PROVIDER_SITE_OTHER): Payer: BC Managed Care – PPO

## 2021-04-08 ENCOUNTER — Other Ambulatory Visit: Payer: Self-pay

## 2021-04-08 VITALS — BP 102/64 | HR 119 | Temp 97.4°F | Ht 72.0 in | Wt 234.2 lb

## 2021-04-08 DIAGNOSIS — Z85118 Personal history of other malignant neoplasm of bronchus and lung: Secondary | ICD-10-CM | POA: Diagnosis not present

## 2021-04-08 DIAGNOSIS — R059 Cough, unspecified: Secondary | ICD-10-CM

## 2021-04-08 DIAGNOSIS — R0600 Dyspnea, unspecified: Secondary | ICD-10-CM

## 2021-04-08 DIAGNOSIS — R590 Localized enlarged lymph nodes: Secondary | ICD-10-CM | POA: Diagnosis not present

## 2021-04-08 DIAGNOSIS — R918 Other nonspecific abnormal finding of lung field: Secondary | ICD-10-CM | POA: Diagnosis not present

## 2021-04-08 DIAGNOSIS — K219 Gastro-esophageal reflux disease without esophagitis: Secondary | ICD-10-CM | POA: Diagnosis not present

## 2021-04-08 DIAGNOSIS — R0609 Other forms of dyspnea: Secondary | ICD-10-CM

## 2021-04-08 DIAGNOSIS — J984 Other disorders of lung: Secondary | ICD-10-CM | POA: Diagnosis not present

## 2021-04-08 DIAGNOSIS — C3411 Malignant neoplasm of upper lobe, right bronchus or lung: Secondary | ICD-10-CM

## 2021-04-08 MED ORDER — BENZONATATE 200 MG PO CAPS: 200.0000 mg | ORAL_CAPSULE | Freq: Three times a day (TID) | ORAL | 1 refills | Status: DC | PRN

## 2021-04-08 MED ORDER — STIOLTO RESPIMAT 2.5-2.5 MCG/ACT IN AERS: 2.0000 | INHALATION_SPRAY | Freq: Every day | RESPIRATORY_TRACT | 3 refills | Status: DC

## 2021-04-08 MED ORDER — STIOLTO RESPIMAT 2.5-2.5 MCG/ACT IN AERS: 2.0000 | INHALATION_SPRAY | Freq: Every day | RESPIRATORY_TRACT | 0 refills | Status: DC

## 2021-04-08 NOTE — Progress Notes (Signed)
@Patient  ID: William Farrell, male    DOB: 1961/02/10, 60 y.o.   MRN: [id number]  Chief Complaint  Patient presents with  . Follow-up    Referring provider: [name], DO  HPI: 60 year old male former smoker followed for non-small cell lung cancer initially diagnosed stage IIIa squamous cell April 2021 with a right upper lobe/suprahilar lung mass in addition to right paratracheal and subcarinal lymphadenopathy  Followed by oncology Dr. Earlie Server.  Receiving concurrent chemoradiation Medical history significant for DM   TEST/EVENTS :  PRIOR THERAPY: 1)Concurrent chemoradiation with weekly carboplatin for AUC of 2 and paclitaxel 45 NG/M2. First dose on Mar 31, 2020. Status post 7 cycles. Last dose was given on 05/12/2020 with partial response. 2)Consolidation immunotherapy with durvalumab 1500 mg IV every 4 weeks. First dose May 12, 2020.Status post 3 cycles. 3) Systemic chemotherapy with carboplatin for an AUC of 5, paclitaxel 175 mg/m2, and Keytruda 200 mg IV every 3 weeks.First dose expected on 10/02/20.  Status post 3 cycles.  Last dose was given on November 13, 2020. 4) SBRT to the enlarging right upper lobe lung nodules under the care of Dr. Lisbeth Renshaw.  CURRENT THERAPY: Second line systemic chemotherapy with docetaxel 75 Mg/M2 and Cyramza 10 mg/KG every 3 weeks with Neulasta support.  First dose December 11, 2020.  Status post 5 cycles.  04/08/2021 Follow up : Lung Cancer , Dyspnea, Cough  Patient returns for a follow-up visit.  Patient was last seen April 2021.  He was undergoing a work-up for for new lung mass and mediastinal adenopathy found on low-dose CT screening protocol.  CT chest April 2021 showed a masslike opacity in the medial right upper lobe and suprahilar region.  Approximately 3.8 x 2.9 cm.  Associated soft tissue narrowing and increased right upper lobe bronchus with nodular opacity extending posteriorly in the right upper lobe.  Positive mediastinal  lymphadenopathy.  Patient underwent a bronchoscopy with EBUS on March 11, 2020.  Final pathology consistent with squamous cell carcinoma.  PET scan showed marked hypermetabolic central right upper lobe pulmonary lesion and associated hypermetabolic nodal metastasis in the right hilum and mediastinum.  MRI brain was negative for metastatic disease.  Patient is followed by oncology and radiation oncology.  As above the patient began concurrent chemoradiation in early May 2021.  He was also treated with immunotherapy.  CT chest abdomen and pelvis in March 2022 showed enlarging nodules and masses and lymph nodes.  Suspicious for disease progression.  Is currently undergoing palliative systemic chemotherapy.  And underwent targeted SBRT to the enlarging right upper lobe lung nodule earlier this month. Patient presents today complaining that he has had ongoing cough pretty much since last year when he initially did radiation.  Over the last 6 months he does feel that his cough has gotten worse.  He has severe coughing paroxysms to the point where he can vomit.  It was recently felt that his cough was being aggravated by ongoing dysphagia.  He did undergo esophageal dilatation earlier this month.  He says his swallowing is much better and his cough did decrease but he continues to have ongoing minimally productive cough. Cough is severe at times, coughing so hard he can vomit. Feels tickles constantly in throat .  Uses albuterol with minimal help.  Patient says he does have fatigue and shortness of breath that has been ongoing during treatment.  He had no hemoptysis no chest pain no orthopnea or increased leg swelling.  No calf pain.  O2 saturations  today in the office are 97% on room air. Patient does remain on Protonix for ongoing GERD.  Has some sinus drainage.  Takes Claritin on occasion.  Allergies  Allergen Reactions  . Sitagliptin Other (See Comments)    headache    Immunization History  Administered  Date(s) Administered  . Influenza Split 07/24/2018  . Influenza, Seasonal, Injecte, Preservative Fre 09/16/2016, 07/16/2017  . Influenza,inj,Quad PF,6+ Mos 09/16/2016, 07/31/2018, 07/03/2019  . Influenza-Unspecified 07/30/2020  . PFIZER Comirnaty(Gray Top)Covid-19 Tri-Sucrose Vaccine 04/04/2021  . PFIZER(Purple Top)SARS-COV-2 Vaccination 02/07/2020, 02/19/2020, 09/20/2020  . Pneumococcal Polysaccharide-23 04/30/2019  . Tdap 09/19/2018  . Zoster Recombinat (Shingrix) 01/24/2021    Past Medical History:  Diagnosis Date  . Asthma    as a child  . Diabetes ([place])   . Diverticulitis   . GERD (gastroesophageal reflux disease)   . High cholesterol   . History of kidney stones   . Lung cancer ([place])   . Wears glasses     Tobacco History: Social History   Tobacco Use  Smoking Status Former Smoker  . Packs/day: 2.00  . Years: 43.00  . Pack years: 86.00  . Types: Cigarettes  . Quit date: 04/15/2020  . Years since quitting: 0.9  Smokeless Tobacco Never Used   Counseling given: Not Answered   Outpatient Medications Prior to Visit  Medication Sig Dispense Refill  . acetaminophen (TYLENOL) 500 MG tablet Take 1,000 mg by mouth every 6 (six) hours as needed for mild pain.    Marland Kitchen albuterol (VENTOLIN HFA) 108 (90 Base) MCG/ACT inhaler Inhale 1-2 puffs into the lungs every 4 (four) hours as needed for wheezing or shortness of breath. 18 g 99  . aspirin EC 81 MG tablet Take 81 mg by mouth at bedtime.     Marland Kitchen atorvastatin (LIPITOR) 40 MG tablet TAKE 1 TABLET DAILY (Patient taking differently: Take 40 mg by mouth daily.) 90 tablet 3  . calcium carbonate (TUMS - DOSED IN MG ELEMENTAL CALCIUM) 500 MG chewable tablet Chew 2 tablets by mouth 3 (three) times daily as needed for indigestion or heartburn.    . dexamethasone (DECADRON) 4 MG tablet TAKE 1 TABLET BY MOUTH TWICE DAILY THE DAY BEFORE, DAY OF AND DAY AFTER CHEMOTHERAPY EVERY 3 WEEKS. (Patient taking differently: Take 4 mg by mouth See admin  instructions. TAKE 1 TABLET BY MOUTH TWICE DAILY THE DAY BEFORE, DAY OF AND DAY AFTER CHEMOTHERAPY EVERY 3 WEEKS.) 40 tablet 0  . Ferrous Fumarate-Vitamin C ER 65-25 MG TBCR Take 1 tablet by mouth daily.    Marland Kitchen glipiZIDE (GLUCOTROL) 5 MG tablet Take 1 tablet (5 mg total) by mouth 2 (two) times daily before a meal. 180 tablet 3  . Lancets (ONETOUCH DELICA PLUS [id number]) MISC USE AS DIRECTED UP TO FOUR TIMES A DAY 100 each 13  . levothyroxine (SYNTHROID) 75 MCG tablet TAKE 1 TABLET BY MOUTH DAILY BEFORE BREAKFAST. 30 tablet 1  . lidocaine-prilocaine (EMLA) cream Apply 1 application topically as needed. (Patient taking differently: Apply 1 application topically as needed (port access).) 30 g 2  . loratadine (CLARITIN) 10 MG tablet Take 10 mg by mouth daily as needed for allergies (after chemo shot).    . meloxicam (MOBIC) 15 MG tablet TAKE 1 TABLET DAILY (Patient taking differently: Take 15 mg by mouth daily.) 90 tablet 3  . metFORMIN (GLUCOPHAGE) 1000 MG tablet TAKE 1 TABLET TWICE A DAY WITH MEALS (Patient taking differently: Take 1,000 mg by mouth 2 (two) times daily with a meal.) 180  tablet 3  . ondansetron (ZOFRAN) 8 MG tablet TAKE 1 TABLET BY MOUTH EVERY 8 HOURS AS NEEDED FOR NAUSEA AND VOMITING 9 tablet 2  . ONETOUCH ULTRA test strip USE AS INSTRUCTED UP TO FOUR TIMES A DAY 100 strip 13  . pantoprazole (PROTONIX) 40 MG tablet Take 1 tablet (40 mg total) by mouth 2 (two) times daily. 180 tablet 3  . Semaglutide,0.25 or 0.5MG /DOS, (OZEMPIC, 0.25 OR 0.5 MG/DOSE,) 2 MG/1.5ML SOPN Inject 0.5 mg into the skin once a week. 7.5 mL 1  . temazepam (RESTORIL) 15 MG capsule Take 1 capsule (15 mg total) by mouth at bedtime as needed for sleep. 30 capsule 0  . trolamine salicylate (ASPERCREME) 10 % cream Apply 1 application topically 2 (two) times daily as needed for muscle pain.     Marland Kitchen sucralfate (CARAFATE) 1 GM/10ML suspension Take 10 mLs (1 g total) by mouth 4 (four) times daily. 420 mL 1   No  facility-administered medications prior to visit.     Review of Systems:   Constitutional:   No  weight loss, night sweats,  Fevers, chills, fatigue, or  lassitude.  HEENT:   No headaches,  Difficulty swallowing,  Tooth/dental problems, or  Sore throat,                No sneezing, itching, ear ache, sternal + nasal congestion, post nasal drip,   CV:  No chest pain,  Orthopnea, PND, swelling in lower extremities, anasarca, dizziness, palpitations, syncope.   GI  No heartburn, indigestion, abdominal pain, nausea, vomiting, diarrhea, change in bowel habits, loss of appetite, bloody stools.   Resp:    No chest wall deformity  Skin: no rash or lesions.  GU: no dysuria, change in color of urine, no urgency or frequency.  No flank pain, no hematuria   MS:  No joint pain or swelling.  No decreased range of motion.  No back pain.    Physical Exam  BP 102/64 (BP Location: Left Arm, Patient Position: Sitting, Cuff Size: Normal)   Pulse (!) 119   Temp (!) 97.4 F (36.3 C) (Temporal)   Ht 6' (1.829 m)   Wt 234 lb 3.2 oz (106.2 kg)   SpO2 97%   BMI 31.76 kg/m   GEN: A/Ox3; pleasant , NAD, well nourished , pale    HEENT:  [place]/AT,   NOSE-clear, THROAT-clear, no lesions, no postnasal drip or exudate noted. Poor dentition   NECK:  Supple w/ fair ROM; no JVD; normal carotid impulses w/o bruits; no thyromegaly or nodules palpated; no lymphadenopathy.    RESP  Clear  P & A; w/o, wheezes/ rales/ or rhonchi. no accessory muscle use, no dullness to percussion  CARD:  RRR, no m/r/g, no peripheral edema, pulses intact, no cyanosis or clubbing.  GI:   Soft & nt; nml bowel sounds; no organomegaly or masses detected.   Musco: Warm bil, no deformities or joint swelling noted.   Neuro: alert, no focal deficits noted.    Skin: Warm, no lesions or rashes    Lab Results:  CBC    Component Value Date/Time   WBC 7.5 04/02/2021 0828   WBC 9.0 03/11/2020 1045   RBC 3.15 (L) 04/02/2021 0828    HGB 9.6 (L) 04/02/2021 0828   HCT 29.2 (L) 04/02/2021 0828   PLT 163 04/02/2021 0828   MCV 92.7 04/02/2021 0828   MCH 30.5 04/02/2021 0828   MCHC 32.9 04/02/2021 0828   RDW 16.9 (H) 04/02/2021 4540  LYMPHSABS 0.9 04/02/2021 0828   MONOABS 1.1 (H) 04/02/2021 0828   EOSABS 0.1 04/02/2021 0828   BASOSABS 0.1 04/02/2021 0828    BMET    Component Value Date/Time   NA 139 04/02/2021 0828   K 4.0 04/02/2021 0828   CL 104 04/02/2021 0828   CO2 25 04/02/2021 0828   GLUCOSE 124 (H) 04/02/2021 0828   BUN 16 04/02/2021 0828   CREATININE 1.15 04/02/2021 0828   CREATININE 1.10 01/14/2020 0859   CALCIUM 9.7 04/02/2021 0828   GFRNONAA >60 04/02/2021 0828   GFRNONAA 73 01/14/2020 0859   GFRAA >60 08/07/2020 0748   GFRAA 85 01/14/2020 0859    BNP No results found for: BNP  ProBNP No results found for: PROBNP  Imaging: DG Chest 2 View  Result Date: 04/08/2021 CLINICAL DATA:  , History of lung cancer EXAM: CHEST - 2 VIEW COMPARISON:  02/09/2021 FINDINGS: Left-sided chest port remains in place terminating at the level of the distal SVC. Stable heart size. Irregular nodular densities within the right upper lobe and right suprahilar regions, grossly similar compared to the recent CT, when accounting for differences in technique. No new airspace opacity. Left lung remains clear. No pleural effusion or pneumothorax. No acute bony findings. IMPRESSION: Irregular nodular densities within the right upper lobe and right suprahilar regions, grossly similar compared to recent CT. No new airspace opacity. Electronically Signed   By: Davina Poke D.O.   On: 04/08/2021 10:20    acetaminophen (TYLENOL) tablet 650 mg    Date Action Dose Route User   Discharged on 03/25/2021   Admitted on 03/25/2021   02/11/2021 0931 Given 650 mg Oral Rollene Rotunda, Maygan L, RN    acetaminophen (TYLENOL) tablet 650 mg    Date Action Dose Route User   Discharged on 03/25/2021   Admitted on 03/25/2021   03/05/2021 4196  Given 650 mg Oral Manuella Ghazi, RN    acetaminophen (TYLENOL) tablet 650 mg    Date Action Dose Route User   03/26/2021 1020 Given 650 mg Oral Young, Molly L, RN    dexamethasone (DECADRON) 10 mg in sodium chloride 0.9 % 50 mL IVPB    Date Action Dose Route User   Discharged on 03/25/2021   Admitted on 03/25/2021   02/11/2021 1110 Restarted (none) Intravenous Willia Craze, RN   02/11/2021 [phone] Rate/Dose Change (none) Intravenous Willia Craze, RN   02/11/2021 [phone] Infusion Verify (none) Intravenous Willia Craze, RN   02/11/2021 [phone] Restarted (none) Intravenous Willia Craze, RN   02/11/2021 0935 Rate/Dose Change (none) Intravenous Rollene Rotunda, Maygan L, RN    dexamethasone (DECADRON) 10 mg in sodium chloride 0.9 % 50 mL IVPB    Date Action Dose Route User   Discharged on 03/25/2021   Admitted on 03/25/2021   03/05/2021 0942 Infusion Verify (none) Intravenous Manuella Ghazi, RN   03/05/2021 4174 Rate/Dose Change (none) Intravenous Manuella Ghazi, RN   03/05/2021 0814 New Bag/Given 10 mg Intravenous Manuella Ghazi, RN    dexamethasone (DECADRON) 10 mg in sodium chloride 0.9 % 50 mL IVPB    Date Action Dose Route User   03/26/2021 1032 Rate/Dose Change (none) Intravenous Veverly Fells, RN   03/26/2021 1032 New Bag/Given 10 mg Intravenous Veverly Fells, RN    diphenhydrAMINE (BENADRYL) injection 50 mg    Date Action Dose Route User   Discharged on 03/25/2021   Admitted on 03/25/2021   02/11/2021 0931 Given 50 mg Intravenous Rollene Rotunda,  Maygan L, RN    diphenhydrAMINE (BENADRYL) injection 50 mg    Date Action Dose Route User   Discharged on 03/25/2021   Admitted on 03/25/2021   03/05/2021 3818 Given 50 mg Intravenous Manuella Ghazi, RN    diphenhydrAMINE (BENADRYL) injection 50 mg    Date Action Dose Route User   03/26/2021 1021 Given 50 mg Intravenous Veverly Fells, RN    DOCEtaxel (TAXOTERE) 180 mg in sodium chloride 0.9 % 250 mL chemo infusion    Date Action Dose Route User   Discharged on  03/25/2021   Admitted on 03/25/2021   02/11/2021 1117 Infusion Verify (none) Intravenous Willia Craze, RN   02/11/2021 1117 Rate/Dose Change (none) Intravenous Willia Craze, RN   02/11/2021 1117 New Bag/Given 180 mg Intravenous Willia Craze, RN    DOCEtaxel (TAXOTERE) 180 mg in sodium chloride 0.9 % 250 mL chemo infusion    Date Action Dose Route User   Discharged on 03/25/2021   Admitted on 03/25/2021   03/05/2021 1213 Rate/Dose Change (none) Intravenous Manuella Ghazi, RN   03/05/2021 1212 Rate/Dose Change (none) Intravenous Manuella Ghazi, RN   03/05/2021 1112 Rate/Dose Change (none) Intravenous Manuella Ghazi, RN   03/05/2021 1112 New Bag/Given 180 mg Intravenous Manuella Ghazi, RN    DOCEtaxel (TAXOTERE) 180 mg in sodium chloride 0.9 % 250 mL chemo infusion    Date Action Dose Route User   03/26/2021 1235 Rate/Dose Change (none) Intravenous Veverly Fells, RN   03/26/2021 1235 New Bag/Given 180 mg Intravenous Doristine Section L, RN    heparin lock flush 100 unit/mL    Date Action Dose Route User   Discharged on 03/25/2021   Admitted on 03/25/2021   02/11/2021 1227 Given 500 Units Intracatheter Rollene Rotunda, Maygan L, RN    heparin lock flush 100 unit/mL    Date Action Dose Route User   Discharged on 03/25/2021   Admitted on 03/25/2021   03/05/2021 1224 Given 500 Units Intracatheter Manuella Ghazi, RN    heparin lock flush 100 unit/mL    Date Action Dose Route User   03/26/2021 1340 Given 500 Units Intracatheter Young, Nydia Bouton, RN    iohexol (OMNIPAQUE) 300 MG/ML solution 100 mL    Date Action Dose Route User   Discharged on 03/25/2021   Admitted on 03/25/2021   02/09/2021 1032 Contrast Given 100 mL Intravenous Joslyn Devon    ondansetron [facility]) injection 8 mg    Date Action Dose Route User   Discharged on 03/25/2021   Admitted on 03/25/2021   03/05/2021 0947 Given 8 mg Intravenous Manuella Ghazi, RN    ondansetron [facility]) injection 8 mg    Date Action Dose Route User   03/26/2021 1053  Given 8 mg Intravenous Veverly Fells, RN    pegfilgrastim-cbqv [facility]) injection 6 mg    Date Action Dose Route User   Discharged on 03/25/2021   Admitted on 03/25/2021   02/12/2021 1426 Given 6 mg Subcutaneous (Right Arm) San Morelle, RN    pegfilgrastim-cbqv [facility]) injection 6 mg    Date Action Dose Route User   Discharged on 03/25/2021   Admitted on 03/25/2021   03/06/2021 1427 Given 6 mg Subcutaneous (Left Arm) Tomasa Rand, RN    pegfilgrastim-cbqv [facility]) injection 6 mg    Date Action Dose Route User   03/27/2021 1601 Given 6 mg Subcutaneous (Right Arm) Poindexter, Macario Carls, RN    ramucirumab (CYRAMZA) 1,100 mg  in sodium chloride 0.9 % 140 mL chemo infusion    Date Action Dose Route User   Discharged on 03/25/2021   Admitted on 03/25/2021   02/11/2021 1039 Infusion Verify (none) Intravenous Willia Craze, RN   02/11/2021 1037 New Bag/Given 1,100 mg Intravenous Rollene Rotunda, Maygan L, RN    ramucirumab [facility]) 1,100 mg in sodium chloride 0.9 % 140 mL chemo infusion    Date Action Dose Route User   Discharged on 03/25/2021   Admitted on 03/25/2021   03/05/2021 1055 Infusion Verify (none) Intravenous Manuella Ghazi, RN   03/05/2021 1033 Rate/Dose Change (none) Intravenous Manuella Ghazi, RN   03/05/2021 1033 New Bag/Given 1,100 mg Intravenous Manuella Ghazi, RN    ramucirumab (CYRAMZA) 1,100 mg in sodium chloride 0.9 % 140 mL chemo infusion    Date Action Dose Route User   03/26/2021 1153 Rate/Dose Change (none) Intravenous Veverly Fells, RN   03/26/2021 1153 New Bag/Given 1,100 mg Intravenous Doristine Section L, RN    0.9 %  sodium chloride infusion    Date Action Dose Route User   Discharged on 03/25/2021   Admitted on 03/25/2021   02/11/2021 1224 Rate/Dose Change (none) Intravenous Willia Craze, RN   02/11/2021 1221 Infusion Verify (none) Intravenous Willia Craze, RN   02/11/2021 1221 Rate/Dose Change (none) Intravenous Willia Craze, RN   02/11/2021 1217 Rate/Dose Change  (none) Intravenous Willia Craze, RN   02/11/2021 1114 Restarted (none) Intravenous Rollene Rotunda, Maygan L, RN    0.9 %  sodium chloride infusion    Date Action Dose Route User   Discharged on 03/25/2021   Admitted on 03/25/2021   03/05/2021 1222 Rate/Dose Change (none) Intravenous Manuella Ghazi, RN   03/05/2021 1218 Rate/Dose Change (none) Intravenous Manuella Ghazi, RN   03/05/2021 1108 Infusion Verify (none) Intravenous Manuella Ghazi, RN   03/05/2021 1108 Rate/Dose Change (none) Intravenous Manuella Ghazi, RN   03/05/2021 1105 Rate/Dose Change (none) Intravenous Earlyne Iba L, RN    0.9 %  sodium chloride infusion    Date Action Dose Route User   03/26/2021 1144 Rate/Dose Change (none) Intravenous Veverly Fells, RN   03/26/2021 1610 New Bag/Given (none) Intravenous Doristine Section L, RN    0.9 %  sodium chloride infusion    Date Action Dose Route User   03/26/2021 1339 Rate/Dose Change (none) Intravenous Veverly Fells, RN   03/26/2021 1335 Rate/Dose Change (none) Intravenous Veverly Fells, RN   03/26/2021 1243 Infusion Verify (none) Intravenous Veverly Fells, RN   03/26/2021 1243 Infusion Verify (none) Intravenous Veverly Fells, RN   03/26/2021 1228 Infusion Verify (none) Intravenous Doristine Section L, RN    sodium chloride flush (NS) 0.9 % injection 10 mL    Date Action Dose Route User   Discharged on 03/25/2021   Admitted on 03/25/2021   02/11/2021 1227 Given 10 mL Intracatheter Payne, Maygan L, RN    sodium chloride flush (NS) 0.9 % injection 10 mL    Date Action Dose Route User   Discharged on 03/25/2021   Admitted on 03/25/2021   03/05/2021 9604 Given 10 mL Intracatheter Alvino Blood E, LPN    sodium chloride flush (NS) 0.9 % injection 10 mL    Date Action Dose Route User   Discharged on 03/25/2021   Admitted on 03/25/2021   03/05/2021 1224 Given 10 mL Intracatheter Manuella Ghazi, RN    sodium chloride flush (NS) 0.9 % injection  10 mL    Date Action Dose Route User   03/26/2021 0076 Given  10 mL Intracatheter Jodell Cipro, [employer] D, LPN    sodium chloride flush (NS) 0.9 % injection 10 mL    Date Action Dose Route User   03/26/2021 1340 Given 10 mL Intracatheter Young, Nydia Bouton, RN      No flowsheet data found.  No results found for: NITRICOXIDE      Assessment & Plan:   Cough Chronic cough suspect is multifactorial with underlying lung cancer.  Patient has a heavy smoking history.  Suspect he has some underlying COPD as well.  We will hold on PFTs at this time.  Going forward would like to have him undergo pulmonary function testing.  Has undergone SBRT to the lung x2. Will give an empiric trial of Stiolto.  Check chest x-ray today. Continue GERD treatment.  Add Claritin for possible postnasal drainage. Add cough control medications. Close follow-up.  Plan  Patient Instructions  Chest xray today .  Begin Stiolto 2 puffs daily  Begin Mucinex DM Twice daily  For cough , As needed   Begin Tessalon Three times a day  For cough as needed  Continue on Claritin 10mg  daily  Follow up with Dr Valeta Harms or Markela Wee NP in 6-8 weeks and As needed   Please contact office for sooner follow up if symptoms do not improve or worsen or seek emergency care        Gastroesophageal reflux disease without esophagitis Continue on GERD therapy.  Malignant neoplasm of right upper lobe of lung [facility]) Continue follow-up with oncology and current treatment plan.     Rexene Edison, NP 04/08/2021

## 2021-04-08 NOTE — Patient Instructions (Addendum)
Chest xray today .  Begin Stiolto 2 puffs daily  Begin Mucinex DM Twice daily  For cough , As needed   Begin Tessalon Three times a day  For cough as needed  Continue on Claritin 10mg  daily  Follow up with Dr Valeta Harms or Trenity Pha NP in 6-8 weeks and As needed   Please contact office for sooner follow up if symptoms do not improve or worsen or seek emergency care

## 2021-04-08 NOTE — Assessment & Plan Note (Signed)
Continue follow-up with oncology and current treatment plan.

## 2021-04-08 NOTE — Assessment & Plan Note (Signed)
Continue on GERD therapy.

## 2021-04-08 NOTE — Assessment & Plan Note (Signed)
Chronic cough suspect is multifactorial with underlying lung cancer.  Patient has a heavy smoking history.  Suspect he has some underlying COPD as well.  We will hold on PFTs at this time.  Going forward would like to have him undergo pulmonary function testing.  Has undergone SBRT to the lung x2. Will give an empiric trial of Stiolto.  Check chest x-ray today. Continue GERD treatment.  Add Claritin for possible postnasal drainage. Add cough control medications. Close follow-up.  Plan  Patient Instructions  Chest xray today .  Begin Stiolto 2 puffs daily  Begin Mucinex DM Twice daily  For cough , As needed   Begin Tessalon Three times a day  For cough as needed  Continue on Claritin 10mg  daily  Follow up with Dr Valeta Harms or Ardena Gangl NP in 6-8 weeks and As needed   Please contact office for sooner follow up if symptoms do not improve or worsen or seek emergency care

## 2021-04-08 NOTE — Progress Notes (Signed)
PCCM:  Thanks for seeing him.  Garner Nash, DO [facility] 04/08/2021 10:38 AM

## 2021-04-09 ENCOUNTER — Telehealth: Payer: Self-pay | Admitting: Adult Health

## 2021-04-09 ENCOUNTER — Inpatient Hospital Stay: Payer: BC Managed Care – PPO

## 2021-04-09 DIAGNOSIS — Z5111 Encounter for antineoplastic chemotherapy: Secondary | ICD-10-CM | POA: Diagnosis not present

## 2021-04-09 DIAGNOSIS — Z5189 Encounter for other specified aftercare: Secondary | ICD-10-CM | POA: Diagnosis not present

## 2021-04-09 DIAGNOSIS — Z79899 Other long term (current) drug therapy: Secondary | ICD-10-CM | POA: Diagnosis not present

## 2021-04-09 DIAGNOSIS — C3411 Malignant neoplasm of upper lobe, right bronchus or lung: Secondary | ICD-10-CM

## 2021-04-09 DIAGNOSIS — Z5112 Encounter for antineoplastic immunotherapy: Secondary | ICD-10-CM | POA: Diagnosis not present

## 2021-04-09 DIAGNOSIS — Z7982 Long term (current) use of aspirin: Secondary | ICD-10-CM | POA: Diagnosis not present

## 2021-04-09 DIAGNOSIS — E119 Type 2 diabetes mellitus without complications: Secondary | ICD-10-CM | POA: Diagnosis not present

## 2021-04-09 LAB — CBC WITH DIFFERENTIAL (CANCER CENTER ONLY)
Abs Immature Granulocytes: 0.11 10*3/uL — ABNORMAL HIGH (ref 0.00–0.07)
Basophils Absolute: 0.1 10*3/uL (ref 0.0–0.1)
Basophils Relative: 1 %
Eosinophils Absolute: 0.2 10*3/uL (ref 0.0–0.5)
Eosinophils Relative: 1 %
HCT: 30.8 % — ABNORMAL LOW (ref 39.0–52.0)
Hemoglobin: 9.7 g/dL — ABNORMAL LOW (ref 13.0–17.0)
Immature Granulocytes: 1 %
Lymphocytes Relative: 8 %
Lymphs Abs: 1.2 10*3/uL (ref 0.7–4.0)
MCH: 29.8 pg (ref 26.0–34.0)
MCHC: 31.5 g/dL (ref 30.0–36.0)
MCV: 94.5 fL (ref 80.0–100.0)
Monocytes Absolute: 1.1 10*3/uL — ABNORMAL HIGH (ref 0.1–1.0)
Monocytes Relative: 7 %
Neutro Abs: 12.5 10*3/uL — ABNORMAL HIGH (ref 1.7–7.7)
Neutrophils Relative %: 82 %
Platelet Count: 164 10*3/uL (ref 150–400)
RBC: 3.26 MIL/uL — ABNORMAL LOW (ref 4.22–5.81)
RDW: 17.6 % — ABNORMAL HIGH (ref 11.5–15.5)
WBC Count: 15.3 10*3/uL — ABNORMAL HIGH (ref 4.0–10.5)
nRBC: 0.3 % — ABNORMAL HIGH (ref 0.0–0.2)

## 2021-04-09 LAB — CMP (CANCER CENTER ONLY)
ALT: 19 U/L (ref 0–44)
AST: 19 U/L (ref 15–41)
Albumin: 3.9 g/dL (ref 3.5–5.0)
Alkaline Phosphatase: 134 U/L — ABNORMAL HIGH (ref 38–126)
Anion gap: 7 (ref 5–15)
BUN: 15 mg/dL (ref 6–20)
CO2: 28 mmol/L (ref 22–32)
Calcium: 9.4 mg/dL (ref 8.9–10.3)
Chloride: 104 mmol/L (ref 98–111)
Creatinine: 1.37 mg/dL — ABNORMAL HIGH (ref 0.61–1.24)
GFR, Estimated: 59 mL/min — ABNORMAL LOW
Glucose, Bld: 87 mg/dL (ref 70–99)
Potassium: 4.7 mmol/L (ref 3.5–5.1)
Sodium: 139 mmol/L (ref 135–145)
Total Bilirubin: 0.3 mg/dL (ref 0.3–1.2)
Total Protein: 6.8 g/dL (ref 6.5–8.1)

## 2021-04-09 NOTE — Telephone Encounter (Signed)
Called and spoke with patient who was returning phone call to get xray results. Per Rexene Edison NP:    Melvenia Needles, NP  04/09/2021 3:43 PM EDT      Chest x-ray shows similar nodularity and the lung-similar to recent CT chest. No pneumonia noted. Continue with office visit recommendations and follow-up   Went over xray results with patient per T. Parrett NP. All questions answered and patient expressed full understanding of results and Tammy's recommendations. Confirmed upcoming scheduled office visit with Dr Valeta Harms on 05/25/21. Nothing further needed at this time.

## 2021-04-09 NOTE — Progress Notes (Signed)
Patient returned phone call, name and birth date confirmed. Went over results. See telephone note from today (04/09/21)

## 2021-04-10 ENCOUNTER — Telehealth: Payer: Self-pay | Admitting: Physician Assistant

## 2021-04-10 NOTE — Progress Notes (Signed)
[facility] OFFICE PROGRESS NOTE  [name], DO [address] [place] [address]  DIAGNOSIS: Recurrent/progressive non-small cell lung cancer initially diagnosed as stage IIIA(T2a, N2, M0) non-small cell lung cancer, squamous cell carcinoma diagnosed in April 2021, presented with right upper lobe/suprahilar lung mass in addition to right paratracheal and subcarinal lymphadenopathy.  Molecular Studies by Guardant 360: No actionable mutations  PRIOR THERAPY:  1)Concurrent chemoradiation with weekly carboplatin for AUC of 2 and paclitaxel 45 NG/M2. First dose on Mar 31, 2020. Status post 7 cycles. Last dose was given on 05/12/2020 with partial response. 2)Consolidation immunotherapy with durvalumab 1500 mg IV every 4 weeks. First dose May 12, 2020.Status post 3 cycles. 3) Systemic chemotherapy with carboplatin for an AUC of 5, paclitaxel 175 mg/m2, and Keytruda 200 mg IV every 3 weeks.First dose expected on 10/02/20.  Status post 3 cycles.  Last dose was given on November 13, 2020. 4) SBRT to the enlarging right upper lobe lung nodules under the care of Dr. Lisbeth Renshaw.  CURRENT THERAPY: Second line systemic chemotherapy with docetaxel 75 Mg/M2 and Cyramza 10 mg/KG every 3 weeks with Neulasta support.  First dose December 11, 2020.  Status post 6 cycles.  INTERVAL HISTORY: William Farrell 60 y.o. male returns to the clinic today for a follow-up visit accompanied by his wife.  The patient is feeling fairly well today without any concerning complaints. He had his esophagus dilated in the interval which has helped his swallowing. The patient denies any fever or recent night sweats. He is cold natured but denies chills. He has some sternal discomfort with coughing for which he recently saw pulmonology and received cough suppressants and inhalers which has helped his cough and shortness of breath. He continues to have some cough and shortness of breath but  overall it is a little bit better compared to before.  He denies any nausea or diarrhea. He has some emesis with coughing too forcefully but he states he actually feels better when this occurs. He has some mild constipation but it is managed with a stool softener. He is wondering under what situations a PET scan or brain MRI would be indicated. He has some headaches in the frontal region of his forehead which resolves with tylenol. He attributes this to sinuses because he also notes improvement with his management of his sinuses. His headaches improve with tylenol. He denies visual changes, falls, balance changes, seizures, or extremity weakness. The patient recently had a follow-up appointment with pulmonology.  The patient was supposed have a restaging CT scan the chest, abdomen and pelvis prior to his appointment today which has not been scheduled until 04/20/21 due to being out of town.  The patient is here today for evaluation before starting cycle #7.      MEDICAL HISTORY: Past Medical History:  Diagnosis Date  . Asthma    as a child  . Diabetes ([place])   . Diverticulitis   . GERD (gastroesophageal reflux disease)   . High cholesterol   . History of kidney stones   . Lung cancer ([place])   . Wears glasses     ALLERGIES:  is allergic to sitagliptin.  MEDICATIONS:  Current Outpatient Medications  Medication Sig Dispense Refill  . acetaminophen (TYLENOL) 500 MG tablet Take 1,000 mg by mouth every 6 (six) hours as needed for mild pain.    Marland Kitchen albuterol (VENTOLIN HFA) 108 (90 Base) MCG/ACT inhaler Inhale 1-2 puffs into the lungs every 4 (four) hours  as needed for wheezing or shortness of breath. 18 g 99  . aspirin EC 81 MG tablet Take 81 mg by mouth at bedtime.     Marland Kitchen atorvastatin (LIPITOR) 40 MG tablet TAKE 1 TABLET DAILY (Patient taking differently: Take 40 mg by mouth daily.) 90 tablet 3  . benzonatate (TESSALON) 200 MG capsule Take 1 capsule (200 mg total) by mouth 3 (three) times daily as  needed for cough. 30 capsule 1  . calcium carbonate (TUMS - DOSED IN MG ELEMENTAL CALCIUM) 500 MG chewable tablet Chew 2 tablets by mouth 3 (three) times daily as needed for indigestion or heartburn.    . dexamethasone (DECADRON) 4 MG tablet TAKE 1 TABLET BY MOUTH TWICE DAILY THE DAY BEFORE, DAY OF AND DAY AFTER CHEMOTHERAPY EVERY 3 WEEKS. (Patient taking differently: Take 4 mg by mouth See admin instructions. TAKE 1 TABLET BY MOUTH TWICE DAILY THE DAY BEFORE, DAY OF AND DAY AFTER CHEMOTHERAPY EVERY 3 WEEKS.) 40 tablet 0  . Ferrous Fumarate-Vitamin C ER 65-25 MG TBCR Take 1 tablet by mouth daily.    Marland Kitchen glipiZIDE (GLUCOTROL) 5 MG tablet Take 1 tablet (5 mg total) by mouth 2 (two) times daily before a meal. 180 tablet 3  . Lancets (ONETOUCH DELICA PLUS [id number]) MISC USE AS DIRECTED UP TO FOUR TIMES A DAY 100 each 13  . levothyroxine (SYNTHROID) 75 MCG tablet TAKE 1 TABLET BY MOUTH DAILY BEFORE BREAKFAST. 30 tablet 1  . lidocaine-prilocaine (EMLA) cream Apply 1 application topically as needed. (Patient taking differently: Apply 1 application topically as needed (port access).) 30 g 2  . loratadine (CLARITIN) 10 MG tablet Take 10 mg by mouth daily as needed for allergies (after chemo shot).    . meloxicam (MOBIC) 15 MG tablet TAKE 1 TABLET DAILY (Patient taking differently: Take 15 mg by mouth daily.) 90 tablet 3  . metFORMIN (GLUCOPHAGE) 1000 MG tablet TAKE 1 TABLET TWICE A DAY WITH MEALS (Patient taking differently: Take 1,000 mg by mouth 2 (two) times daily with a meal.) 180 tablet 3  . ondansetron (ZOFRAN) 8 MG tablet TAKE 1 TABLET BY MOUTH EVERY 8 HOURS AS NEEDED FOR NAUSEA AND VOMITING 9 tablet 2  . ONETOUCH ULTRA test strip USE AS INSTRUCTED UP TO FOUR TIMES A DAY 100 strip 13  . pantoprazole (PROTONIX) 40 MG tablet Take 1 tablet (40 mg total) by mouth 2 (two) times daily. 180 tablet 3  . Semaglutide,0.25 or 0.5MG /DOS, (OZEMPIC, 0.25 OR 0.5 MG/DOSE,) 2 MG/1.5ML SOPN Inject 0.5 mg into the skin once  a week. 7.5 mL 1  . temazepam (RESTORIL) 15 MG capsule Take 1 capsule (15 mg total) by mouth at bedtime as needed for sleep. 30 capsule 0  . Tiotropium Bromide-Olodaterol (STIOLTO RESPIMAT) 2.5-2.5 MCG/ACT AERS Inhale 2 puffs into the lungs daily. 3 each 3  . trolamine salicylate (ASPERCREME) 10 % cream Apply 1 application topically 2 (two) times daily as needed for muscle pain.     . Tiotropium Bromide-Olodaterol (STIOLTO RESPIMAT) 2.5-2.5 MCG/ACT AERS Inhale 2 puffs into the lungs daily. 4 g 0   No current facility-administered medications for this visit.   Facility-Administered Medications Ordered in Other Visits  Medication Dose Route Frequency Provider Last Rate Last Admin  . DOCEtaxel (TAXOTERE) 180 mg in sodium chloride 0.9 % 250 mL chemo infusion  75 mg/m2 (Treatment Plan Recorded) Intravenous Once Curt Bears, MD      . heparin lock flush 100 unit/mL  500 Units Intracatheter Once PRN Curt Bears,  MD      . ramucirumab (CYRAMZA) 1,100 mg in sodium chloride 0.9 % 140 mL chemo infusion  10 mg/kg (Treatment Plan Recorded) Intravenous Once Curt Bears, MD      . sodium chloride flush (NS) 0.9 % injection 10 mL  10 mL Intracatheter PRN Curt Bears, MD        SURGICAL HISTORY:  Past Surgical History:  Procedure Laterality Date  . BIOPSY  03/25/2021   Procedure: BIOPSY;  Surgeon: Lavena Bullion, DO;  Location: [facility];  Service: Gastroenterology;;  . broken bone repair    . BRONCHIAL BIOPSY  03/11/2020   Procedure: BRONCHIAL BIOPSIES;  Surgeon: Garner Nash, DO;  Location: [facility];  Service: Pulmonary;;  . COLONOSCOPY     around [address]. [place]  03/11/2020   Procedure: CRYOTHERAPY;  Surgeon: Garner Nash, DO;  Location: [facility];  Service: Pulmonary;;  . ESOPHAGEAL DILATION  03/25/2021   Procedure: ESOPHAGEAL DILATION;  Surgeon: Lavena Bullion, DO;  Location: [facility];  Service: Gastroenterology;;  .  ESOPHAGOGASTRODUODENOSCOPY (EGD) WITH PROPOFOL N/A 03/25/2021   Procedure: ESOPHAGOGASTRODUODENOSCOPY (EGD) WITH PROPOFOL;  Surgeon: Lavena Bullion, DO;  Location: [facility];  Service: Gastroenterology;  Laterality: N/A;  dialation of stricture  . FINE NEEDLE ASPIRATION  03/11/2020   Procedure: FINE NEEDLE ASPIRATION (FNA) LINEAR;  Surgeon: Garner Nash, DO;  Location: [facility];  Service: Pulmonary;;  . IR IMAGING GUIDED PORT INSERTION  10/16/2020  . KNEE ARTHROSCOPY WITH ANTERIOR CRUCIATE LIGAMENT (ACL) REPAIR     x 2  . VIDEO BRONCHOSCOPY WITH ENDOBRONCHIAL ULTRASOUND N/A 03/11/2020   Procedure: VIDEO BRONCHOSCOPY WITH ENDOBRONCHIAL ULTRASOUND;  Surgeon: Garner Nash, DO;  Location: [facility];  Service: Pulmonary;  Laterality: N/A;    REVIEW OF SYSTEMS:   Review of Systems  Constitutional: Positive for fatigue (stable). Negative for appetite change, chills, fever and unexpected weight change.  HENT: Negative for mouth sores, nosebleeds, sore throat and trouble swallowing.   Eyes: Negative for eye problems and icterus.  Respiratory: Positive for cough and dyspnea on exertion. Negative for hemoptysis and wheezing.   Cardiovascular: Positive for occasional chest discomfort with coughing. Negative for leg swelling.  Gastrointestinal: positive for mild constipation. Positive for emesis with forceful coughing. Negative for abdominal pain, diarrhea, and nausea.  Genitourinary: Negative for bladder incontinence, difficulty urinating, dysuria, frequency and hematuria.   Musculoskeletal: Negative for back pain, gait problem, neck pain and neck stiffness.  Skin: Negative for itching and rash.  Neurological: Positive for headaches. Negative for dizziness, extremity weakness, gait problem, light-headedness and seizures.  Hematological: Negative for adenopathy. Does not bruise/bleed easily.  Psychiatric/Behavioral: Negative for confusion, depression and sleep disturbance. The patient is  not nervous/anxious.     PHYSICAL EXAMINATION:  Blood pressure 124/90, pulse (!) 113, temperature (!) 96.9 F (36.1 C), temperature source Tympanic, resp. rate 18, height 6' (1.829 m), weight 235 lb (106.6 kg), SpO2 100 %.  ECOG PERFORMANCE STATUS: 1 - Symptomatic but completely ambulatory  Physical Exam  Constitutional: Oriented to person, place, and time and well-developed, well-nourished, and in no distress.  HENT:  Head: Normocephalic and atraumatic.  Mouth/Throat: Oropharynx is clear and moist. No oropharyngeal exudate.  Eyes: Conjunctivae are normal. Right eye exhibits no discharge. Left eye exhibits no discharge. No scleral icterus.  Neck: Normal range of motion. Neck supple.  Cardiovascular: Normal rate, regular rhythm, normal heart sounds and intact distal pulses.   Pulmonary/Chest: Effort normal and breath sounds  normal. No respiratory distress. No wheezes. No rales.  Abdominal: Soft. Bowel sounds are normal. Exhibits no distension and no mass. There is no tenderness.  Musculoskeletal: Normal range of motion. Exhibits no edema.  Lymphadenopathy:    No cervical adenopathy.  Neurological: Alert and oriented to person, place, and time. Exhibits normal muscle tone. Gait normal. Coordination normal.  Skin: Skin is warm and dry. No rash noted. Not diaphoretic. No erythema. No pallor.  Psychiatric: Mood, memory and judgment normal.  Vitals reviewed.  LABORATORY DATA: Lab Results  Component Value Date   WBC 20.8 (H) 04/15/2021   HGB 8.9 (L) 04/15/2021   HCT 27.3 (L) 04/15/2021   MCV 93.8 04/15/2021   PLT 210 04/15/2021      Chemistry      Component Value Date/Time   NA 142 04/15/2021 0928   K 4.5 04/15/2021 0928   CL 107 04/15/2021 0928   CO2 21 (L) 04/15/2021 0928   BUN 19 04/15/2021 0928   CREATININE 1.39 (H) 04/15/2021 0928   CREATININE 1.10 01/14/2020 0859      Component Value Date/Time   CALCIUM 9.4 04/15/2021 0928   ALKPHOS 108 04/15/2021 0928   AST 12 (L)  04/15/2021 0928   ALT 12 04/15/2021 0928   BILITOT 0.3 04/15/2021 0928       RADIOGRAPHIC STUDIES:  DG Chest 2 View  Result Date: 04/08/2021 CLINICAL DATA:  , History of lung cancer EXAM: CHEST - 2 VIEW COMPARISON:  02/09/2021 FINDINGS: Left-sided chest port remains in place terminating at the level of the distal SVC. Stable heart size. Irregular nodular densities within the right upper lobe and right suprahilar regions, grossly similar compared to the recent CT, when accounting for differences in technique. No new airspace opacity. Left lung remains clear. No pleural effusion or pneumothorax. No acute bony findings. IMPRESSION: Irregular nodular densities within the right upper lobe and right suprahilar regions, grossly similar compared to recent CT. No new airspace opacity. Electronically Signed   By: Davina Poke D.O.   On: 04/08/2021 10:20     ASSESSMENT/PLAN:  This is a very pleasant 60 year old Caucasian male diagnosed withrecurrent lung cancer initially diagnosed as stage IIIa non-small cell lung cancer, squamous cell carcinoma in April 2021 presented with right upper lobe/suprahilar lung mass in addition to right paratracheal and subcarinal lymphadenopathy. He does not have any actionable mutations by guardant 360.   He underwent a course of concurrent chemoradiation with weekly carboplatin for an AUC of 2 and paclitaxel 45 mg per metered square.  He is status post 7 cycles.  He tolerated this well except for some mild dysphagia and odynophagia.  He then underwent consolidation immunotherapy with Imfinzi 1500 mg IV every 4 weeks.  He was status post 3 cycles but had evidence of disease progression after cycle #3 so this treatment was discontinued.  He then started first-line chemotherapy with carboplatin for an AUC of 5, paclitaxel 175 mg per metered squared, Keytruda 200 mg IV every 3 weeks.  He was status post 3 cycles which showed an enlarging nodule and masses in the chest  and enlarging lymph nodes.  This was suspicious for disease progression.  Therefore, the patient was started on systemic palliative chemotherapy with second line docetaxel 75 mg per metered squared and Cyramza 10 mg/kg IV every 3 weeks with Neulasta support.  The patient is status post 6 cycles.  The patient also underwent palliative radiotherapy to the enlarging right upper lobe lung mass under the care of Dr.  Moody.  I reviewed his symptoms with Dr. Julien Nordmann. Labs were reviewed with the patient. Recommend that he proceed with cycle #7 today as scheduled.   I will call the patient after his CT scan next week to review the results.   We will see the patient back for follow-up visit in 3 weeks for evaluation before starting cycle #8.  I reviewed signs and symptoms of metastatic disease to the brain with the patient. He knows if he develops any concerning symptoms to communicate these to [place].   Patient will continue to take Restoril as needed for insomnia.  The patient was advised to call immediately if he has any concerning symptoms in the interval. The patient voices understanding of current disease status and treatment options and is in agreement with the current care plan. All questions were answered. The patient knows to call the clinic with any problems, questions or concerns. We can certainly see the patient much sooner if necessary       No orders of the defined types were placed in this encounter.    I spent 20-29 minutes in this encounter.   Tyriana Helmkamp L Annely Sliva, PA-C 04/15/21

## 2021-04-10 NOTE — Telephone Encounter (Signed)
I called the patient and gave him and his wife instructions on how to schedule his CT scan. They will not be able to get it before his next follow up appointment with [place] because they are going to be out of town. I recommended that they call to get it scheduled at the earliest availability.

## 2021-04-15 ENCOUNTER — Inpatient Hospital Stay: Payer: BC Managed Care – PPO

## 2021-04-15 ENCOUNTER — Other Ambulatory Visit: Payer: Self-pay

## 2021-04-15 ENCOUNTER — Inpatient Hospital Stay: Payer: BC Managed Care – PPO | Attending: Internal Medicine | Admitting: Physician Assistant

## 2021-04-15 ENCOUNTER — Encounter: Payer: Self-pay | Admitting: Physician Assistant

## 2021-04-15 VITALS — BP 124/90 | HR 113 | Temp 96.9°F | Resp 18 | Ht 72.0 in | Wt 235.0 lb

## 2021-04-15 DIAGNOSIS — C3411 Malignant neoplasm of upper lobe, right bronchus or lung: Secondary | ICD-10-CM

## 2021-04-15 DIAGNOSIS — Z7982 Long term (current) use of aspirin: Secondary | ICD-10-CM | POA: Insufficient documentation

## 2021-04-15 DIAGNOSIS — Z5112 Encounter for antineoplastic immunotherapy: Secondary | ICD-10-CM | POA: Insufficient documentation

## 2021-04-15 DIAGNOSIS — E119 Type 2 diabetes mellitus without complications: Secondary | ICD-10-CM | POA: Insufficient documentation

## 2021-04-15 DIAGNOSIS — Z5111 Encounter for antineoplastic chemotherapy: Secondary | ICD-10-CM | POA: Diagnosis not present

## 2021-04-15 DIAGNOSIS — Z5189 Encounter for other specified aftercare: Secondary | ICD-10-CM | POA: Diagnosis not present

## 2021-04-15 DIAGNOSIS — Z7984 Long term (current) use of oral hypoglycemic drugs: Secondary | ICD-10-CM | POA: Diagnosis not present

## 2021-04-15 DIAGNOSIS — Z79899 Other long term (current) drug therapy: Secondary | ICD-10-CM | POA: Insufficient documentation

## 2021-04-15 DIAGNOSIS — Z95828 Presence of other vascular implants and grafts: Secondary | ICD-10-CM

## 2021-04-15 LAB — CMP (CANCER CENTER ONLY)
ALT: 12 U/L (ref 0–44)
AST: 12 U/L — ABNORMAL LOW (ref 15–41)
Albumin: 3.2 g/dL — ABNORMAL LOW (ref 3.5–5.0)
Alkaline Phosphatase: 108 U/L (ref 38–126)
Anion gap: 14 (ref 5–15)
BUN: 19 mg/dL (ref 6–20)
CO2: 21 mmol/L — ABNORMAL LOW (ref 22–32)
Calcium: 9.4 mg/dL (ref 8.9–10.3)
Chloride: 107 mmol/L (ref 98–111)
Creatinine: 1.39 mg/dL — ABNORMAL HIGH (ref 0.61–1.24)
GFR, Estimated: 58 mL/min — ABNORMAL LOW (ref >60)
Glucose, Bld: 228 mg/dL — ABNORMAL HIGH (ref 70–99)
Potassium: 4.5 mmol/L (ref 3.5–5.1)
Sodium: 142 mmol/L (ref 135–145)
Total Bilirubin: 0.3 mg/dL (ref 0.3–1.2)
Total Protein: 7.1 g/dL (ref 6.5–8.1)

## 2021-04-15 LAB — CBC WITH DIFFERENTIAL (CANCER CENTER ONLY)
Abs Immature Granulocytes: 0.12 10*3/uL — ABNORMAL HIGH (ref 0.00–0.07)
Basophils Absolute: 0.1 10*3/uL (ref 0.0–0.1)
Basophils Relative: 0 %
Eosinophils Absolute: 0 10*3/uL (ref 0.0–0.5)
Eosinophils Relative: 0 %
HCT: 27.3 % — ABNORMAL LOW (ref 39.0–52.0)
Hemoglobin: 8.9 g/dL — ABNORMAL LOW (ref 13.0–17.0)
Immature Granulocytes: 1 %
Lymphocytes Relative: 5 %
Lymphs Abs: 1 10*3/uL (ref 0.7–4.0)
MCH: 30.6 pg (ref 26.0–34.0)
MCHC: 32.6 g/dL (ref 30.0–36.0)
MCV: 93.8 fL (ref 80.0–100.0)
Monocytes Absolute: 1.3 10*3/uL — ABNORMAL HIGH (ref 0.1–1.0)
Monocytes Relative: 6 %
Neutro Abs: 18.3 10*3/uL — ABNORMAL HIGH (ref 1.7–7.7)
Neutrophils Relative %: 88 %
Platelet Count: 210 10*3/uL (ref 150–400)
RBC: 2.91 MIL/uL — ABNORMAL LOW (ref 4.22–5.81)
RDW: 18 % — ABNORMAL HIGH (ref 11.5–15.5)
WBC Count: 20.8 10*3/uL — ABNORMAL HIGH (ref 4.0–10.5)
nRBC: 0.1 % (ref 0.0–0.2)

## 2021-04-15 LAB — TSH: TSH: 1.43 u[IU]/mL (ref 0.320–4.118)

## 2021-04-15 LAB — TOTAL PROTEIN, URINE DIPSTICK: Protein, ur: <30 mg/dL — AB

## 2021-04-15 MED ORDER — SODIUM CHLORIDE 0.9 % IV SOLN
10.0000 mg | Freq: Once | INTRAVENOUS | Status: AC
  Administered 2021-04-15: 10 mg via INTRAVENOUS
  Filled 2021-04-15: qty 10

## 2021-04-15 MED ORDER — SODIUM CHLORIDE 0.9 % IV SOLN
10.0000 mg/kg | Freq: Once | INTRAVENOUS | Status: AC
  Administered 2021-04-15: 1100 mg via INTRAVENOUS
  Filled 2021-04-15: qty 100

## 2021-04-15 MED ORDER — SODIUM CHLORIDE 0.9% FLUSH
10.0000 mL | INTRAVENOUS | Status: DC | PRN
Start: 2021-04-15 — End: 2021-04-15
  Administered 2021-04-15: 10 mL
  Filled 2021-04-15: qty 10

## 2021-04-15 MED ORDER — HEPARIN SOD (PORK) LOCK FLUSH 100 UNIT/ML IV SOLN
500.0000 [IU] | Freq: Once | INTRAVENOUS | Status: AC | PRN
  Administered 2021-04-15: 500 [IU]
  Filled 2021-04-15: qty 5

## 2021-04-15 MED ORDER — ONDANSETRON HCL 4 MG/2ML IJ SOLN
INTRAMUSCULAR | Status: AC
  Filled 2021-04-15: qty 4

## 2021-04-15 MED ORDER — SODIUM CHLORIDE 0.9% FLUSH
10.0000 mL | Freq: Once | INTRAVENOUS | Status: AC
  Administered 2021-04-15: 10 mL
  Filled 2021-04-15: qty 10

## 2021-04-15 MED ORDER — ACETAMINOPHEN 325 MG PO TABS
ORAL_TABLET | ORAL | Status: AC
  Filled 2021-04-15: qty 2

## 2021-04-15 MED ORDER — SODIUM CHLORIDE 0.9 % IV SOLN
Freq: Once | INTRAVENOUS | Status: AC
  Filled 2021-04-15: qty 250

## 2021-04-15 MED ORDER — DIPHENHYDRAMINE HCL 50 MG/ML IJ SOLN
50.0000 mg | Freq: Once | INTRAMUSCULAR | Status: AC
  Administered 2021-04-15: 50 mg via INTRAVENOUS

## 2021-04-15 MED ORDER — DIPHENHYDRAMINE HCL 50 MG/ML IJ SOLN
INTRAMUSCULAR | Status: AC
  Filled 2021-04-15: qty 1

## 2021-04-15 MED ORDER — ONDANSETRON HCL 4 MG/2ML IJ SOLN
8.0000 mg | Freq: Once | INTRAMUSCULAR | Status: AC
  Administered 2021-04-15: 8 mg via INTRAVENOUS

## 2021-04-15 MED ORDER — ACETAMINOPHEN 325 MG PO TABS
650.0000 mg | ORAL_TABLET | Freq: Once | ORAL | Status: AC
  Administered 2021-04-15: 650 mg via ORAL

## 2021-04-15 MED ORDER — SODIUM CHLORIDE 0.9 % IV SOLN
75.0000 mg/m2 | Freq: Once | INTRAVENOUS | Status: AC
  Administered 2021-04-15: 180 mg via INTRAVENOUS
  Filled 2021-04-15: qty 18

## 2021-04-15 NOTE — Progress Notes (Signed)
Per Cassie, PA, OK to treat today with HR 113

## 2021-04-15 NOTE — Patient Instructions (Signed)
[facility]ICAL [facility]COLOG[facility]  Discharge Instructions: Thank you for choosing [facility] to provide your oncology and hematology care.   If you have a lab appointment with the Cancer Center, please go directly to the Cancer Center and check in at the registration area.   Wear comfortable clothing and clothing appropriate for easy access to any Portacath or PICC line.   We strive to give you quality time with your provider. You may need to reschedule your appointment if you arrive late (15 or more minutes).  Arriving late affects you and other patients whose appointments are after yours.  Also, if you miss three or more appointments without notifying the office, you may be dismissed from the clinic at the provider's discretion.      For prescription refill requests, have your pharmacy contact our office and allow 72 hours for refills to be completed.    Today you received the following chemotherapy and/or immunotherapy agents cyramza, docetaxel      To help prevent nausea and vomiting after your treatment, we encourage you to take your nausea medication as directed.  BELOW ARE SYMPTOMS THAT SHOULD BE REPORTED IMMEDIATELY: *FEVER GREATER THAN 100.4 F (38 C) OR HIGHER *CHILLS OR SWEATING *NAUSEA AND VOMITING THAT IS NOT CONTROLLED WITH YOUR NAUSEA MEDICATION *UNUSUAL SHORTNESS OF BREATH *UNUSUAL BRUISING OR BLEEDING *URINARY PROBLEMS (pain or burning when urinating, or frequent urination) *BOWEL PROBLEMS (unusual diarrhea, constipation, pain near the anus) TENDERNESS IN MOUTH AND THROAT WITH OR WITHOUT PRESENCE OF ULCERS (sore throat, sores in mouth, or a toothache) UNUSUAL RASH, SWELLING OR PAIN  UNUSUAL VAGINAL DISCHARGE OR ITCHING   Items with * indicate a potential emergency and should be followed up as soon as possible or go to the Emergency Department if any problems should occur.  Please show the CHEMOTHERAPY ALERT CARD or IMMUNOTHERAPY ALERT CARD at  check-in to the Emergency Department and triage nurse.  Should you have questions after your visit or need to cancel or reschedule your appointment, please contact [facility]  Dept: [phone]  and follow the prompts.  Office hours are 8:00 a.m. to 4:30 p.m. Monday - Friday. Please note that voicemails left after 4:00 p.m. may not be returned until the following business day.  We are closed weekends and major holidays. You have access to a nurse at all times for urgent questions. Please call the main number to the clinic Dept: [phone] and follow the prompts.   For any non-urgent questions, you may also contact your provider using MyChart. We now offer e-Visits for anyone 18 and older to request care online for non-urgent symptoms. For details visit [email or web address].   Also download the MyChart app! Go to the app store, search "MyChart", open the app, select [facility], and log in with your MyChart username and password.  Due to Covid, a mask is required upon entering the hospital/clinic. If you do not have a mask, one will be given to you upon arrival. For doctor visits, patients may have 1 support person aged 18 or older with them. For treatment visits, patients cannot have anyone with them due to current Covid guidelines and our immunocompromised population.   

## 2021-04-17 ENCOUNTER — Inpatient Hospital Stay: Payer: BC Managed Care – PPO

## 2021-04-17 ENCOUNTER — Other Ambulatory Visit: Payer: Self-pay

## 2021-04-17 VITALS — BP 123/72 | HR 112 | Temp 98.1°F | Resp 18

## 2021-04-17 DIAGNOSIS — Z7982 Long term (current) use of aspirin: Secondary | ICD-10-CM | POA: Diagnosis not present

## 2021-04-17 DIAGNOSIS — E119 Type 2 diabetes mellitus without complications: Secondary | ICD-10-CM | POA: Diagnosis not present

## 2021-04-17 DIAGNOSIS — Z79899 Other long term (current) drug therapy: Secondary | ICD-10-CM | POA: Diagnosis not present

## 2021-04-17 DIAGNOSIS — Z5112 Encounter for antineoplastic immunotherapy: Secondary | ICD-10-CM | POA: Diagnosis not present

## 2021-04-17 DIAGNOSIS — Z5111 Encounter for antineoplastic chemotherapy: Secondary | ICD-10-CM | POA: Diagnosis not present

## 2021-04-17 DIAGNOSIS — C3411 Malignant neoplasm of upper lobe, right bronchus or lung: Secondary | ICD-10-CM | POA: Diagnosis not present

## 2021-04-17 DIAGNOSIS — Z7984 Long term (current) use of oral hypoglycemic drugs: Secondary | ICD-10-CM | POA: Diagnosis not present

## 2021-04-17 DIAGNOSIS — Z5189 Encounter for other specified aftercare: Secondary | ICD-10-CM | POA: Diagnosis not present

## 2021-04-17 MED ORDER — PEGFILGRASTIM-CBQV 6 MG/0.6ML ~~LOC~~ SOSY
6.0000 mg | PREFILLED_SYRINGE | Freq: Once | SUBCUTANEOUS | Status: AC
  Administered 2021-04-17: 6 mg via SUBCUTANEOUS

## 2021-04-17 MED ORDER — PEGFILGRASTIM-CBQV 6 MG/0.6ML ~~LOC~~ SOSY
PREFILLED_SYRINGE | SUBCUTANEOUS | Status: AC
  Filled 2021-04-17: qty 0.6

## 2021-04-17 NOTE — Patient Instructions (Signed)
[facility]  Discharge Instructions: Thank you for choosing [facility] to provide your oncology and hematology care.   If you have a lab appointment with the [facility], please go directly to the [facility] and check in at the registration area.  Wear comfortable clothing and clothing appropriate for easy access to any Portacath or PICC line.   We strive to give you quality time with your provider. You may need to reschedule your appointment if you arrive late (15 or more minutes).  Arriving late affects you and other patients whose appointments are after yours.  Also, if you miss three or more appointments without notifying the office, you may be dismissed from the clinic at the provider's discretion.      For prescription refill requests, have your pharmacy contact our office and allow 72 hours for refills to be completed.    Today you received the following chemotherapy and/or immunotherapy agents udenyca    To help prevent nausea and vomiting after your treatment, we encourage you to take your nausea medication as directed.  BELOW ARE SYMPTOMS THAT SHOULD BE REPORTED IMMEDIATELY: . *FEVER GREATER THAN 100.4 F (38 C) OR HIGHER . *CHILLS OR SWEATING . *NAUSEA AND VOMITING THAT IS NOT CONTROLLED WITH YOUR NAUSEA MEDICATION . *UNUSUAL SHORTNESS OF BREATH . *UNUSUAL BRUISING OR BLEEDING . *URINARY PROBLEMS (pain or burning when urinating, or frequent urination) . *BOWEL PROBLEMS (unusual diarrhea, constipation, pain near the anus) . TENDERNESS IN MOUTH AND THROAT WITH OR WITHOUT PRESENCE OF ULCERS (sore throat, sores in mouth, or a toothache) . UNUSUAL RASH, SWELLING OR PAIN  . UNUSUAL VAGINAL DISCHARGE OR ITCHING   Items with * indicate a potential emergency and should be followed up as soon as possible or go to the Emergency Department if any problems should occur.  Please show the CHEMOTHERAPY ALERT CARD or IMMUNOTHERAPY ALERT CARD at  check-in to the Emergency Department and triage nurse. Should you have questions after your visit or need to cancel or reschedule your appointment, please contact [facility]  [phone] and follow the prompts.  Office hours are 8:00 a.m. to 4:30 p.m. Monday - Friday. Please note that voicemails left after 4:00 p.m. may not be returned until the following business day.  We are closed weekends and major holidays. You have access to a nurse at all times for urgent questions. Please call the main number to the clinic [phone] and follow the prompts.  For any non-urgent questions, you may also contact your provider using MyChart. We now offer e-Visits for anyone 41 and older to request care online for non-urgent symptoms. For details visit [email or web address].   Also download the MyChart app! Go to the app store, search "MyChart", open the app, select [facility], and log in with your MyChart username and password.  Due to Covid, a mask is required upon entering the hospital/clinic. If you do not have a mask, one will be given to you upon arrival. For doctor visits, patients may have 1 support person aged 58 or older with them. For treatment visits, patients cannot have anyone with them due to current Covid guidelines and our immunocompromised population.

## 2021-04-19 ENCOUNTER — Encounter: Payer: Self-pay | Admitting: Internal Medicine

## 2021-04-20 ENCOUNTER — Other Ambulatory Visit: Payer: Self-pay | Admitting: Physician Assistant

## 2021-04-20 ENCOUNTER — Encounter: Payer: Self-pay | Admitting: Internal Medicine

## 2021-04-20 ENCOUNTER — Ambulatory Visit (INDEPENDENT_AMBULATORY_CARE_PROVIDER_SITE_OTHER): Payer: BC Managed Care – PPO

## 2021-04-20 ENCOUNTER — Other Ambulatory Visit: Payer: Self-pay

## 2021-04-20 DIAGNOSIS — K5750 Diverticulosis of both small and large intestine without perforation or abscess without bleeding: Secondary | ICD-10-CM | POA: Diagnosis not present

## 2021-04-20 DIAGNOSIS — C3490 Malignant neoplasm of unspecified part of unspecified bronchus or lung: Secondary | ICD-10-CM

## 2021-04-20 DIAGNOSIS — Z5111 Encounter for antineoplastic chemotherapy: Secondary | ICD-10-CM | POA: Diagnosis not present

## 2021-04-20 DIAGNOSIS — J90 Pleural effusion, not elsewhere classified: Secondary | ICD-10-CM | POA: Diagnosis not present

## 2021-04-20 DIAGNOSIS — M47816 Spondylosis without myelopathy or radiculopathy, lumbar region: Secondary | ICD-10-CM | POA: Diagnosis not present

## 2021-04-20 DIAGNOSIS — C3411 Malignant neoplasm of upper lobe, right bronchus or lung: Secondary | ICD-10-CM

## 2021-04-20 DIAGNOSIS — I313 Pericardial effusion (noninflammatory): Secondary | ICD-10-CM | POA: Diagnosis not present

## 2021-04-20 DIAGNOSIS — N200 Calculus of kidney: Secondary | ICD-10-CM | POA: Diagnosis not present

## 2021-04-20 MED ORDER — IOHEXOL 300 MG/ML  SOLN
100.0000 mL | Freq: Once | INTRAMUSCULAR | Status: AC | PRN
  Administered 2021-04-20: 100 mL via INTRAVENOUS

## 2021-04-22 ENCOUNTER — Telehealth: Payer: Self-pay | Admitting: Physician Assistant

## 2021-04-22 NOTE — Telephone Encounter (Signed)
I called the patient to review his CT scan. We also discussed other questions about anemia and his shortness of breath with exertion and what parameters would need to be met to qualify for oxygen tanks. I answered all of his questions to the best of my ability. He was appreciative of the call.

## 2021-04-23 ENCOUNTER — Inpatient Hospital Stay: Payer: BC Managed Care – PPO

## 2021-04-23 ENCOUNTER — Ambulatory Visit: Payer: BC Managed Care – PPO | Admitting: Pulmonary Disease

## 2021-04-23 ENCOUNTER — Other Ambulatory Visit: Payer: Self-pay

## 2021-04-23 DIAGNOSIS — Z7982 Long term (current) use of aspirin: Secondary | ICD-10-CM | POA: Diagnosis not present

## 2021-04-23 DIAGNOSIS — Z79899 Other long term (current) drug therapy: Secondary | ICD-10-CM | POA: Diagnosis not present

## 2021-04-23 DIAGNOSIS — Z5111 Encounter for antineoplastic chemotherapy: Secondary | ICD-10-CM | POA: Diagnosis not present

## 2021-04-23 DIAGNOSIS — Z5189 Encounter for other specified aftercare: Secondary | ICD-10-CM | POA: Diagnosis not present

## 2021-04-23 DIAGNOSIS — C3411 Malignant neoplasm of upper lobe, right bronchus or lung: Secondary | ICD-10-CM | POA: Diagnosis not present

## 2021-04-23 DIAGNOSIS — Z7984 Long term (current) use of oral hypoglycemic drugs: Secondary | ICD-10-CM | POA: Diagnosis not present

## 2021-04-23 DIAGNOSIS — E119 Type 2 diabetes mellitus without complications: Secondary | ICD-10-CM | POA: Diagnosis not present

## 2021-04-23 DIAGNOSIS — Z5112 Encounter for antineoplastic immunotherapy: Secondary | ICD-10-CM | POA: Diagnosis not present

## 2021-04-23 LAB — CMP (CANCER CENTER ONLY)
ALT: 14 U/L (ref 0–44)
AST: 15 U/L (ref 15–41)
Albumin: 3.9 g/dL (ref 3.5–5.0)
Alkaline Phosphatase: 143 U/L — ABNORMAL HIGH (ref 38–126)
Anion gap: 9 (ref 5–15)
BUN: 23 mg/dL — ABNORMAL HIGH (ref 6–20)
CO2: 24 mmol/L (ref 22–32)
Calcium: 9.5 mg/dL (ref 8.9–10.3)
Chloride: 102 mmol/L (ref 98–111)
Creatinine: 1.52 mg/dL — ABNORMAL HIGH (ref 0.61–1.24)
GFR, Estimated: 52 mL/min — ABNORMAL LOW (ref >60)
Glucose, Bld: 150 mg/dL — ABNORMAL HIGH (ref 70–99)
Potassium: 4.4 mmol/L (ref 3.5–5.1)
Sodium: 135 mmol/L (ref 135–145)
Total Bilirubin: 0.4 mg/dL (ref 0.3–1.2)
Total Protein: 6.7 g/dL (ref 6.5–8.1)

## 2021-04-23 LAB — CBC WITH DIFFERENTIAL (CANCER CENTER ONLY)
Abs Immature Granulocytes: 0.16 10*3/uL — ABNORMAL HIGH (ref 0.00–0.07)
Basophils Absolute: 0.1 10*3/uL (ref 0.0–0.1)
Basophils Relative: 1 %
Eosinophils Absolute: 0.1 10*3/uL (ref 0.0–0.5)
Eosinophils Relative: 1 %
HCT: 29.2 % — ABNORMAL LOW (ref 39.0–52.0)
Hemoglobin: 9.5 g/dL — ABNORMAL LOW (ref 13.0–17.0)
Immature Granulocytes: 2 %
Lymphocytes Relative: 11 %
Lymphs Abs: 1.1 10*3/uL (ref 0.7–4.0)
MCH: 30.5 pg (ref 26.0–34.0)
MCHC: 32.5 g/dL (ref 30.0–36.0)
MCV: 93.9 fL (ref 80.0–100.0)
Monocytes Absolute: 1.4 10*3/uL — ABNORMAL HIGH (ref 0.1–1.0)
Monocytes Relative: 14 %
Neutro Abs: 7.3 10*3/uL (ref 1.7–7.7)
Neutrophils Relative %: 71 %
Platelet Count: 173 10*3/uL (ref 150–400)
RBC: 3.11 MIL/uL — ABNORMAL LOW (ref 4.22–5.81)
RDW: 17.6 % — ABNORMAL HIGH (ref 11.5–15.5)
WBC Count: 10.1 10*3/uL (ref 4.0–10.5)
nRBC: 0.6 % — ABNORMAL HIGH (ref 0.0–0.2)

## 2021-04-23 LAB — TOTAL PROTEIN, URINE DIPSTICK: Protein, ur: <30 mg/dL — AB

## 2021-04-25 ENCOUNTER — Other Ambulatory Visit: Payer: Self-pay | Admitting: Internal Medicine

## 2021-04-25 DIAGNOSIS — R112 Nausea with vomiting, unspecified: Secondary | ICD-10-CM

## 2021-04-27 ENCOUNTER — Other Ambulatory Visit: Payer: Self-pay

## 2021-04-27 ENCOUNTER — Ambulatory Visit: Payer: BC Managed Care – PPO

## 2021-04-27 ENCOUNTER — Encounter: Payer: Self-pay | Admitting: Internal Medicine

## 2021-04-28 ENCOUNTER — Telehealth: Payer: Self-pay | Admitting: Medical Oncology

## 2021-04-28 ENCOUNTER — Encounter: Payer: Self-pay | Admitting: Medical Oncology

## 2021-04-28 ENCOUNTER — Other Ambulatory Visit: Payer: Self-pay | Admitting: Medical Oncology

## 2021-04-28 ENCOUNTER — Other Ambulatory Visit: Payer: Self-pay | Admitting: Physician Assistant

## 2021-04-28 DIAGNOSIS — F40240 Claustrophobia: Secondary | ICD-10-CM

## 2021-04-28 MED ORDER — LORAZEPAM 0.5 MG PO TABS: ORAL_TABLET | ORAL | 0 refills | Status: DC

## 2021-04-28 NOTE — Telephone Encounter (Signed)
Requests antianxiety prep for MRI .

## 2021-04-29 ENCOUNTER — Encounter: Payer: Self-pay | Admitting: Gastroenterology

## 2021-04-29 ENCOUNTER — Ambulatory Visit (INDEPENDENT_AMBULATORY_CARE_PROVIDER_SITE_OTHER): Payer: BC Managed Care – PPO | Admitting: Gastroenterology

## 2021-04-29 ENCOUNTER — Telehealth: Payer: Self-pay

## 2021-04-29 ENCOUNTER — Other Ambulatory Visit: Payer: Self-pay

## 2021-04-29 VITALS — BP 106/68 | HR 115 | Ht 72.0 in | Wt 233.4 lb

## 2021-04-29 DIAGNOSIS — C3411 Malignant neoplasm of upper lobe, right bronchus or lung: Secondary | ICD-10-CM

## 2021-04-29 DIAGNOSIS — K2100 Gastro-esophageal reflux disease with esophagitis, without bleeding: Secondary | ICD-10-CM | POA: Diagnosis not present

## 2021-04-29 DIAGNOSIS — K222 Esophageal obstruction: Secondary | ICD-10-CM

## 2021-04-29 DIAGNOSIS — Z923 Personal history of irradiation: Secondary | ICD-10-CM | POA: Diagnosis not present

## 2021-04-29 DIAGNOSIS — R053 Chronic cough: Secondary | ICD-10-CM

## 2021-04-29 NOTE — Patient Instructions (Signed)
If you are age 61 or older, your body mass index should be between 23-30. Your Body mass index is 31.65 kg/m. If this is out of the aforementioned range listed, please consider follow up with your Primary Care Provider.  If you are age 83 or younger, your body mass index should be between 19-25. Your Body mass index is 31.65 kg/m. If this is out of the aformentioned range listed, please consider follow up with your Primary Care Provider.   Due to recent changes in healthcare laws, you may see the results of your imaging and laboratory studies on MyChart before your provider has had a chance to review them.  We understand that in some cases there may be results that are confusing or concerning to you. Not all laboratory results come back in the same time frame and the provider may be waiting for multiple results in order to interpret others.  Please give [place] 48 hours in order for your provider to thoroughly review all the results before contacting the office for clarification of your results.   Thank you for choosing me and [facility].  Vito Cirigliano, D.O.

## 2021-04-29 NOTE — Progress Notes (Signed)
Chief Complaint:    Procedure follow-up  GI History: 60 y.o. male with a history of stage III non-small cell lung cancer, squamous cell carcinoma diagnosed 02/2020 treated with chemoradiation and currently systemic treatment and radiotherapy, HLD, diabetes, nephrolithiasis, initially seen in the GI clinic on 03/07/2021 for evaluation of dysphagia and regurgitation.  EGD 03/25/2021 notable for LA Grade C erosive esophagitis and distal esophageal stricture, treated with 20 mm TTS balloon.  Biopsies from the stricture demonstrate injury more likely to be 2/2 radiation rather than reflux induced peptic stricture.  Was treated with high-dose PPI and Carafate with significant clinical improvement and resolution of dysphagia.    Endoscopic History: -Colonoscopy years ago- normal per patient. Does not want to go through repeat colonoscopy. -EGD years ago at that time- reflux esophagitis per patient, treated with Prevacid - EGD (03/2021): LA Grade C erosive esophagitis, stricture at 40 cm dilated with 20 mm TTS balloon (path favors radiation-induced injury), 2 cm HH, mild non-H. pylori gastritis  HPI:     Patient is a 60 y.o. male presenting to the Gastroenterology Clinic for postprocedure follow-up.   Today, he states dysphagia has resolved after EGD with esophageal dilation 03/2021.  P.o. intake much improved and tolerating all foods.  Seems to have less regurgitation.  Took the Carafate for 1 week, and continues to take Protonix 40 mg bid as prescribed.   Main issue today continues to be chronic cough with intermittent posttussive emesis.  Was seen in the Pulmonary Clinic on 04/08/2021.  Started on trial of Stiolto, Mucinex DM, Tessalon, and Claritin for possible postnasal drainage.  Has follow-up scheduled on 05/25/2021.  Was seen in the Oncology clinic on 04/15/2021.  Currently undergoing second line systemic palliative chemotherapy with docetaxel and Cyramza every 3 weeks with Neulasta support and  along with palliative radiotherapy to enlarging RUL mass by Dr. Lisbeth Renshaw.    Review of systems:     No chest pain, no SOB, no fevers, no urinary sx   Past Medical History:  Diagnosis Date   Asthma    as a child   Diabetes ([place])    Diverticulitis    GERD (gastroesophageal reflux disease)    High cholesterol    History of kidney stones    Lung cancer ([place])    Wears glasses     Patient's surgical history, family medical history, social history, medications and allergies were all reviewed in Epic    Current Outpatient Medications  Medication Sig Dispense Refill   acetaminophen (TYLENOL) 500 MG tablet Take 1,000 mg by mouth every 6 (six) hours as needed for mild pain.     albuterol (VENTOLIN HFA) 108 (90 Base) MCG/ACT inhaler Inhale 1-2 puffs into the lungs every 4 (four) hours as needed for wheezing or shortness of breath. 18 g 99   aspirin EC 81 MG tablet Take 81 mg by mouth at bedtime.      atorvastatin (LIPITOR) 40 MG tablet TAKE 1 TABLET DAILY (Patient taking differently: Take 40 mg by mouth daily.) 90 tablet 3   benzonatate (TESSALON) 200 MG capsule Take 1 capsule (200 mg total) by mouth 3 (three) times daily as needed for cough. 30 capsule 1   calcium carbonate (TUMS - DOSED IN MG ELEMENTAL CALCIUM) 500 MG chewable tablet Chew 2 tablets by mouth 3 (three) times daily as needed for indigestion or heartburn.     dexamethasone (DECADRON) 4 MG tablet TAKE 1 TABLET BY MOUTH TWICE DAILY THE DAY BEFORE, DAY OF AND  DAY AFTER CHEMOTHERAPY EVERY 3 WEEKS. (Patient taking differently: Take 4 mg by mouth See admin instructions. TAKE 1 TABLET BY MOUTH TWICE DAILY THE DAY BEFORE, DAY OF AND DAY AFTER CHEMOTHERAPY EVERY 3 WEEKS.) 40 tablet 0   Ferrous Fumarate-Vitamin C ER 65-25 MG TBCR Take 1 tablet by mouth daily.     glipiZIDE (GLUCOTROL) 5 MG tablet Take 1 tablet (5 mg total) by mouth 2 (two) times daily before a meal. 180 tablet 3   Lancets (ONETOUCH DELICA PLUS [id number]) MISC USE AS DIRECTED  UP TO FOUR TIMES A DAY 100 each 13   levothyroxine (SYNTHROID) 75 MCG tablet TAKE 1 TABLET BY MOUTH DAILY BEFORE BREAKFAST. 30 tablet 1   lidocaine-prilocaine (EMLA) cream Apply 1 application topically as needed. (Patient taking differently: Apply 1 application topically as needed (port access).) 30 g 2   loratadine (CLARITIN) 10 MG tablet Take 10 mg by mouth daily as needed for allergies (after chemo shot).     LORazepam (ATIVAN) 0.5 MG tablet Take 1 tablet 30 minutes before your MRI 2 tablet 0   meloxicam (MOBIC) 15 MG tablet TAKE 1 TABLET DAILY (Patient taking differently: Take 15 mg by mouth daily.) 90 tablet 3   metFORMIN (GLUCOPHAGE) 1000 MG tablet TAKE 1 TABLET TWICE A DAY WITH MEALS (Patient taking differently: Take 1,000 mg by mouth 2 (two) times daily with a meal.) 180 tablet 3   ondansetron (ZOFRAN) 8 MG tablet TAKE 1 TABLET BY MOUTH EVERY 8 HOURS AS NEEDED FOR NAUSEA AND VOMITING 9 tablet 2   ONETOUCH ULTRA test strip USE AS INSTRUCTED UP TO FOUR TIMES A DAY 100 strip 13   pantoprazole (PROTONIX) 40 MG tablet Take 1 tablet (40 mg total) by mouth 2 (two) times daily. 180 tablet 3   Semaglutide,0.25 or 0.5MG /DOS, (OZEMPIC, 0.25 OR 0.5 MG/DOSE,) 2 MG/1.5ML SOPN Inject 0.5 mg into the skin once a week. 7.5 mL 1   temazepam (RESTORIL) 15 MG capsule Take 1 capsule (15 mg total) by mouth at bedtime as needed for sleep. 30 capsule 0   Tiotropium Bromide-Olodaterol (STIOLTO RESPIMAT) 2.5-2.5 MCG/ACT AERS Inhale 2 puffs into the lungs daily. 3 each 3   Tiotropium Bromide-Olodaterol (STIOLTO RESPIMAT) 2.5-2.5 MCG/ACT AERS Inhale 2 puffs into the lungs daily. 4 g 0   trolamine salicylate (ASPERCREME) 10 % cream Apply 1 application topically 2 (two) times daily as needed for muscle pain.      No current facility-administered medications for this visit.    Physical Exam:     BP 106/68 (BP Location: Left Arm)   Pulse (!) 115   Ht 6' (1.829 m)   Wt 233 lb 6 oz (105.9 kg)   SpO2 97%   BMI 31.65  kg/m   GENERAL:  Pleasant male in NAD PSYCH: : Cooperative, normal affect NEURO: Alert and oriented x 3, no focal neurologic deficits   IMPRESSION and PLAN:    1) GERD with erosive esophagitis 2) Esophageal stricture 3) History of radiation - Resolution of dysphagia with EGD with dilation to high-dose PPI - Continue Protonix 40 mg p.o. twice daily for now - Plan for repeat upper endoscopy at 8 weeks (end of July or early August) to evaluate for mucosal healing of esophagitis and repeat esophageal dilation if needed - While the stricture was focal and <1 cm in length, biopsies favor radiation-induced injury rather than peptic stricture.  Nonetheless, has had robust clinical response  4) Chronic cough 5) Non-small cell lung cancer - Continue follow-up  in the Pulmonary clinic - Maximizing medical management of reflux to reduce risk of reflux irritation of upper airways - Continue follow-up in the Oncology and Radiation Oncology clinics as planned - Due to elevated periprocedural risks, plan for EGD to be done at [facility]  The indications, risks, and benefits of EGD were explained to the patient in detail. Risks include but are not limited to bleeding, perforation, adverse reaction to medications, and cardiopulmonary compromise. Sequelae include but are not limited to the possibility of surgery, hospitalization, and mortality. The patient verbalized understanding and wished to proceed. All questions answered, referred to scheduler. Further recommendations pending results of the exam.            Lavena Bullion ,DO, FACG 04/29/2021, 11:52 AM

## 2021-04-29 NOTE — Telephone Encounter (Signed)
I left VM to the Pt.'s cell phone to give me a call back at [place] regarding his EGD Appt. at [facility]. All information regarding procedure is already sent in My Chart.Marland Kitchen

## 2021-04-29 NOTE — Telephone Encounter (Signed)
Spoke with the Pt. Advised him that instruction and appt. Date for EGD was sent to My Chart, and if he has any questions he can give [place] a call.

## 2021-04-30 ENCOUNTER — Other Ambulatory Visit: Payer: Self-pay | Admitting: Physician Assistant

## 2021-04-30 ENCOUNTER — Inpatient Hospital Stay: Payer: BC Managed Care – PPO

## 2021-04-30 ENCOUNTER — Other Ambulatory Visit: Payer: Self-pay

## 2021-04-30 DIAGNOSIS — E119 Type 2 diabetes mellitus without complications: Secondary | ICD-10-CM | POA: Diagnosis not present

## 2021-04-30 DIAGNOSIS — Z79899 Other long term (current) drug therapy: Secondary | ICD-10-CM | POA: Diagnosis not present

## 2021-04-30 DIAGNOSIS — C3411 Malignant neoplasm of upper lobe, right bronchus or lung: Secondary | ICD-10-CM | POA: Diagnosis not present

## 2021-04-30 DIAGNOSIS — Z7984 Long term (current) use of oral hypoglycemic drugs: Secondary | ICD-10-CM | POA: Diagnosis not present

## 2021-04-30 DIAGNOSIS — Z5111 Encounter for antineoplastic chemotherapy: Secondary | ICD-10-CM | POA: Diagnosis not present

## 2021-04-30 DIAGNOSIS — E039 Hypothyroidism, unspecified: Secondary | ICD-10-CM

## 2021-04-30 DIAGNOSIS — Z5112 Encounter for antineoplastic immunotherapy: Secondary | ICD-10-CM | POA: Diagnosis not present

## 2021-04-30 DIAGNOSIS — Z5189 Encounter for other specified aftercare: Secondary | ICD-10-CM | POA: Diagnosis not present

## 2021-04-30 DIAGNOSIS — Z7982 Long term (current) use of aspirin: Secondary | ICD-10-CM | POA: Diagnosis not present

## 2021-04-30 LAB — CMP (CANCER CENTER ONLY)
ALT: 14 U/L (ref 0–44)
AST: 15 U/L (ref 15–41)
Albumin: 4 g/dL (ref 3.5–5.0)
Alkaline Phosphatase: 139 U/L — ABNORMAL HIGH (ref 38–126)
Anion gap: 10 (ref 5–15)
BUN: 15 mg/dL (ref 6–20)
CO2: 25 mmol/L (ref 22–32)
Calcium: 9.8 mg/dL (ref 8.9–10.3)
Chloride: 102 mmol/L (ref 98–111)
Creatinine: 1.41 mg/dL — ABNORMAL HIGH (ref 0.61–1.24)
GFR, Estimated: 57 mL/min — ABNORMAL LOW (ref >60)
Glucose, Bld: 120 mg/dL — ABNORMAL HIGH (ref 70–99)
Potassium: 4.6 mmol/L (ref 3.5–5.1)
Sodium: 137 mmol/L (ref 135–145)
Total Bilirubin: 0.4 mg/dL (ref 0.3–1.2)
Total Protein: 6.7 g/dL (ref 6.5–8.1)

## 2021-04-30 LAB — CBC WITH DIFFERENTIAL (CANCER CENTER ONLY)
Abs Immature Granulocytes: 0.08 10*3/uL — ABNORMAL HIGH (ref 0.00–0.07)
Basophils Absolute: 0.1 10*3/uL (ref 0.0–0.1)
Basophils Relative: 1 %
Eosinophils Absolute: 0.1 10*3/uL (ref 0.0–0.5)
Eosinophils Relative: 1 %
HCT: 32.5 % — ABNORMAL LOW (ref 39.0–52.0)
Hemoglobin: 10.4 g/dL — ABNORMAL LOW (ref 13.0–17.0)
Immature Granulocytes: 1 %
Lymphocytes Relative: 8 %
Lymphs Abs: 1.3 10*3/uL (ref 0.7–4.0)
MCH: 30.1 pg (ref 26.0–34.0)
MCHC: 32 g/dL (ref 30.0–36.0)
MCV: 93.9 fL (ref 80.0–100.0)
Monocytes Absolute: 1.2 10*3/uL — ABNORMAL HIGH (ref 0.1–1.0)
Monocytes Relative: 8 %
Neutro Abs: 13 10*3/uL — ABNORMAL HIGH (ref 1.7–7.7)
Neutrophils Relative %: 81 %
Platelet Count: 198 10*3/uL (ref 150–400)
RBC: 3.46 MIL/uL — ABNORMAL LOW (ref 4.22–5.81)
RDW: 17.9 % — ABNORMAL HIGH (ref 11.5–15.5)
WBC Count: 15.9 10*3/uL — ABNORMAL HIGH (ref 4.0–10.5)
nRBC: 0 % (ref 0.0–0.2)

## 2021-05-04 ENCOUNTER — Ambulatory Visit (INDEPENDENT_AMBULATORY_CARE_PROVIDER_SITE_OTHER): Payer: BC Managed Care – PPO

## 2021-05-04 ENCOUNTER — Other Ambulatory Visit: Payer: Self-pay

## 2021-05-04 DIAGNOSIS — C3411 Malignant neoplasm of upper lobe, right bronchus or lung: Secondary | ICD-10-CM

## 2021-05-04 DIAGNOSIS — R519 Headache, unspecified: Secondary | ICD-10-CM

## 2021-05-04 MED ORDER — GADOBUTROL 1 MMOL/ML IV SOLN
10.0000 mL | Freq: Once | INTRAVENOUS | Status: AC | PRN
  Administered 2021-05-04: 10 mL via INTRAVENOUS

## 2021-05-06 ENCOUNTER — Telehealth (INDEPENDENT_AMBULATORY_CARE_PROVIDER_SITE_OTHER): Payer: BC Managed Care – PPO | Admitting: Osteopathic Medicine

## 2021-05-06 ENCOUNTER — Encounter: Payer: Self-pay | Admitting: Osteopathic Medicine

## 2021-05-06 DIAGNOSIS — J3489 Other specified disorders of nose and nasal sinuses: Secondary | ICD-10-CM

## 2021-05-06 DIAGNOSIS — R0981 Nasal congestion: Secondary | ICD-10-CM | POA: Diagnosis not present

## 2021-05-06 MED ORDER — BENZONATATE 200 MG PO CAPS: 200.0000 mg | ORAL_CAPSULE | Freq: Three times a day (TID) | ORAL | 1 refills | Status: DC | PRN

## 2021-05-06 NOTE — Progress Notes (Signed)
Telemedicine Visit via  Audio only - telephone (patient preference /  technical difficulty with MyChart video application)  I connected with William Farrell on 05/06/21 at 11:22 AM  by phone or  telemedicine application as noted above  I verified that I am speaking with or regarding  the correct patient using two identifiers.  Participants: Myself, Dr [name] DO Patient: William Farrell Patient proxy if applicable: none Other, if applicable: none  Patient is at home I am in office at [facility]    I discussed the limitations of evaluation and management  by telemedicine and the availability of in person appointments.  The participant(s) above expressed understanding and  agreed to proceed with this appointment via telemedicine.       History of Present Illness: William Farrell is a 60 y.o. male who would like to discuss Headaches   Headache: He attributes this to sinus issue, reports pressure in forehead, sinus pressure worse in the mornings, mucus production. Has been using nasal saline once he's cleared it out every morning. Oncology got an MRI to eval for brain mets (under treatment for lung cancer)   Reports frequent coughing issues, throughout the day, occasionally so bad he will vomit. He reports it's worse w/ lying flat. Occasionally productive of scant amount of phlegm but usually dry. Pulmonary started him on Stiolto which helps      Observations/Objective: There were no vitals taken for this visit. BP Readings from Last 3 Encounters:  05/07/21 110/71  04/29/21 106/68  04/17/21 123/72   Exam: Normal Speech.  NAD  Lab and Radiology Results No results found for this or any previous visit (from the past 72 hour(s)). MR Brain W Wo Contrast  Result Date: 05/05/2021 CLINICAL DATA:  New headaches.  History of metastatic lung cancer. EXAM: MRI HEAD WITHOUT AND WITH CONTRAST TECHNIQUE: Multiplanar, multiecho pulse sequences of the  brain and surrounding structures were obtained without and with intravenous contrast. CONTRAST:  57mL GADAVIST GADOBUTROL 1 MMOL/ML IV SOLN COMPARISON:  03/22/2020 FINDINGS: Brain: There is no evidence of an acute infarct, intracranial hemorrhage, mass, midline shift, or extra-axial fluid collection. The ventricles and sulci are normal. No significant white matter disease is seen for age. No abnormal enhancement is identified. Vascular: Major intracranial vascular flow voids are preserved. Unchanged 8 mm focus of mixed T1 and T2 signal intensity adjacent to the distal aspect of the left petrous ICA, indeterminate for a potentially partially thrombosed aneurysm. Skull and upper cervical spine: Unremarkable bone marrow signal. Sinuses/Orbits: Unremarkable orbits. Minimal mucosal thickening in the paranasal sinuses. Mildly decreased size of a small left mastoid effusion. Other: None. IMPRESSION: 1. Unremarkable appearance of the brain for age. No evidence of intracranial metastases. 2. 8 mm focus of signal abnormality adjacent to the distal left petrous ICA. Head CTA is recommended to evaluate for an aneurysm. Electronically Signed   By: Logan Bores M.D.   On: 05/05/2021 08:02       Assessment and Plan: 60 y.o. male with Diagnoses of Head congestion and Sinus pressure were pertinent to this visit.  Difficult to tell on virtual visit but MRI (+)sinus mucosal thickening suggests likely postnasal drip etiology of symptoms. See patient instructions.   PDMP not reviewed this encounter. No orders of the defined types were placed in this encounter.  Meds ordered this encounter  Medications   benzonatate (TESSALON) 200 MG capsule    Sig: Take 1 capsule (200 mg total) by mouth 3 (three) times daily as needed for  cough.    Dispense:  90 capsule    Refill:  1   Patient Instructions  Claritin daily, or can add Sudafed or use the combination Claritin-D  If that's not helping add Flonase  I've sent Tessalon  for cough   Instructions sent via MyChart.   Follow Up Instructions: Return if symptoms worsen or fail to improve.    I discussed the assessment and treatment plan with the patient. The patient was provided an opportunity to ask questions and all were answered. The patient agreed with the plan and demonstrated an understanding of the instructions.   The patient was advised to call back or seek an in-person evaluation if any new concerns, if symptoms worsen or if the condition fails to improve as anticipated.  21 minutes of non-face-to-face time was provided during this encounter.      . . . . . . . . . . . . . Marland Kitchen                   Historical information moved to improve visibility of documentation.  Past Medical History:  Diagnosis Date   Asthma    as a child   Diabetes ([place])    Diverticulitis    GERD (gastroesophageal reflux disease)    High cholesterol    History of kidney stones    Lung cancer ([place])    Wears glasses    Past Surgical History:  Procedure Laterality Date   BIOPSY  03/25/2021   Procedure: BIOPSY;  Surgeon: Lavena Bullion, DO;  Location: [facility];  Service: Gastroenterology;;   broken bone repair     BRONCHIAL BIOPSY  03/11/2020   Procedure: BRONCHIAL BIOPSIES;  Surgeon: Garner Nash, DO;  Location: [facility];  Service: Pulmonary;;   COLONOSCOPY     around 67[place]    CRYOTHERAPY  03/11/2020   Procedure: CRYOTHERAPY;  Surgeon: Garner Nash, DO;  Location: [facility];  Service: Pulmonary;;   ESOPHAGEAL DILATION  03/25/2021   Procedure: ESOPHAGEAL DILATION;  Surgeon: Lavena Bullion, DO;  Location: [facility];  Service: Gastroenterology;;   ESOPHAGOGASTRODUODENOSCOPY (EGD) WITH PROPOFOL N/A 03/25/2021   Procedure: ESOPHAGOGASTRODUODENOSCOPY (EGD) WITH PROPOFOL;  Surgeon: Lavena Bullion, DO;  Location: [facility];  Service: Gastroenterology;  Laterality: N/A;  dialation of stricture   FINE  NEEDLE ASPIRATION  03/11/2020   Procedure: FINE NEEDLE ASPIRATION (FNA) LINEAR;  Surgeon: Garner Nash, DO;  Location: [facility];  Service: Pulmonary;;   IR IMAGING GUIDED PORT INSERTION  10/16/2020   KNEE ARTHROSCOPY WITH ANTERIOR CRUCIATE LIGAMENT (ACL) REPAIR     x 2   VIDEO BRONCHOSCOPY WITH ENDOBRONCHIAL ULTRASOUND N/A 03/11/2020   Procedure: VIDEO BRONCHOSCOPY WITH ENDOBRONCHIAL ULTRASOUND;  Surgeon: Garner Nash, DO;  Location: [facility];  Service: Pulmonary;  Laterality: N/A;   Social History   Tobacco Use   Smoking status: Former    Packs/day: 2.00    Years: 43.00    Pack years: 86.00    Types: Cigarettes    Quit date: 04/15/2020    Years since quitting: 1.0   Smokeless tobacco: Never  Substance Use Topics   Alcohol use: Not Currently   family history includes High blood pressure in his mother; Stroke in his mother.  Medications: Current Outpatient Medications  Medication Sig Dispense Refill   acetaminophen (TYLENOL) 500 MG tablet Take 1,000 mg by mouth every 6 (six) hours as needed for mild pain.     albuterol (VENTOLIN HFA)  108 (90 Base) MCG/ACT inhaler Inhale 1-2 puffs into the lungs every 4 (four) hours as needed for wheezing or shortness of breath. 18 g 99   aspirin EC 81 MG tablet Take 81 mg by mouth at bedtime.      atorvastatin (LIPITOR) 40 MG tablet TAKE 1 TABLET DAILY (Patient taking differently: Take 40 mg by mouth daily.) 90 tablet 3   benzonatate (TESSALON) 200 MG capsule Take 1 capsule (200 mg total) by mouth 3 (three) times daily as needed for cough. 90 capsule 1   calcium carbonate (TUMS - DOSED IN MG ELEMENTAL CALCIUM) 500 MG chewable tablet Chew 2 tablets by mouth 3 (three) times daily as needed for indigestion or heartburn.     dexamethasone (DECADRON) 4 MG tablet TAKE 1 TABLET BY MOUTH TWICE DAILY THE DAY BEFORE, DAY OF AND DAY AFTER CHEMOTHERAPY EVERY 3 WEEKS. (Patient taking differently: Take 4 mg by mouth See admin instructions. TAKE 1 TABLET  BY MOUTH TWICE DAILY THE DAY BEFORE, DAY OF AND DAY AFTER CHEMOTHERAPY EVERY 3 WEEKS.) 40 tablet 0   Ferrous Fumarate-Vitamin C ER 65-25 MG TBCR Take 1 tablet by mouth daily.     glipiZIDE (GLUCOTROL) 5 MG tablet Take 1 tablet (5 mg total) by mouth 2 (two) times daily before a meal. 180 tablet 3   Lancets (ONETOUCH DELICA PLUS [id number]) MISC USE AS DIRECTED UP TO FOUR TIMES A DAY 100 each 13   levothyroxine (SYNTHROID) 75 MCG tablet TAKE 1 TABLET BY MOUTH EVERY DAY BEFORE BREAKFAST 30 tablet 1   lidocaine-prilocaine (EMLA) cream Apply 1 application topically as needed. (Patient taking differently: Apply 1 application topically as needed (port access).) 30 g 2   loratadine (CLARITIN) 10 MG tablet Take 10 mg by mouth daily as needed for allergies (after chemo shot).     LORazepam (ATIVAN) 0.5 MG tablet Take 1 tablet 30 minutes before your MRI (Patient not taking: Reported on 05/07/2021) 2 tablet 0   meloxicam (MOBIC) 15 MG tablet TAKE 1 TABLET DAILY (Patient taking differently: Take 15 mg by mouth daily.) 90 tablet 3   metFORMIN (GLUCOPHAGE) 1000 MG tablet TAKE 1 TABLET TWICE A DAY WITH MEALS (Patient taking differently: Take 1,000 mg by mouth 2 (two) times daily with a meal.) 180 tablet 3   ondansetron (ZOFRAN) 8 MG tablet TAKE 1 TABLET BY MOUTH EVERY 8 HOURS AS NEEDED FOR NAUSEA AND VOMITING 9 tablet 2   ONETOUCH ULTRA test strip USE AS INSTRUCTED UP TO FOUR TIMES A DAY 100 strip 13   pantoprazole (PROTONIX) 40 MG tablet Take 1 tablet (40 mg total) by mouth 2 (two) times daily. 180 tablet 3   Semaglutide,0.25 or 0.5MG /DOS, (OZEMPIC, 0.25 OR 0.5 MG/DOSE,) 2 MG/1.5ML SOPN Inject 0.5 mg into the skin once a week. 7.5 mL 1   temazepam (RESTORIL) 15 MG capsule Take 1 capsule (15 mg total) by mouth at bedtime as needed for sleep. 30 capsule 0   Tiotropium Bromide-Olodaterol (STIOLTO RESPIMAT) 2.5-2.5 MCG/ACT AERS Inhale 2 puffs into the lungs daily. 3 each 3   trolamine salicylate (ASPERCREME) 10 % cream  Apply 1 application topically 2 (two) times daily as needed for muscle pain.      No current facility-administered medications for this visit.   Facility-Administered Medications Ordered in Other Visits  Medication Dose Route Frequency Provider Last Rate Last Admin   sodium chloride flush (NS) 0.9 % injection 10 mL  10 mL Intracatheter PRN Curt Bears, MD   10 mL at  05/07/21 1309   Allergies  Allergen Reactions   Sitagliptin Other (See Comments)    headache     If phone visit, billing and coding can please add appropriate modifier if needed

## 2021-05-06 NOTE — Patient Instructions (Addendum)
Claritin daily, or can add Sudafed or use the combination Claritin-D  If that's not helping add Flonase  I've sent Tessalon for cough

## 2021-05-07 ENCOUNTER — Inpatient Hospital Stay: Payer: BC Managed Care – PPO

## 2021-05-07 ENCOUNTER — Inpatient Hospital Stay (HOSPITAL_BASED_OUTPATIENT_CLINIC_OR_DEPARTMENT_OTHER): Payer: BC Managed Care – PPO | Admitting: Internal Medicine

## 2021-05-07 ENCOUNTER — Other Ambulatory Visit: Payer: Self-pay

## 2021-05-07 ENCOUNTER — Other Ambulatory Visit: Payer: Self-pay | Admitting: Internal Medicine

## 2021-05-07 VITALS — HR 110

## 2021-05-07 VITALS — BP 110/71 | HR 121 | Temp 97.7°F | Resp 18 | Ht 72.0 in | Wt 231.0 lb

## 2021-05-07 DIAGNOSIS — Z7982 Long term (current) use of aspirin: Secondary | ICD-10-CM | POA: Diagnosis not present

## 2021-05-07 DIAGNOSIS — Z5112 Encounter for antineoplastic immunotherapy: Secondary | ICD-10-CM | POA: Diagnosis not present

## 2021-05-07 DIAGNOSIS — Z5111 Encounter for antineoplastic chemotherapy: Secondary | ICD-10-CM | POA: Diagnosis not present

## 2021-05-07 DIAGNOSIS — C3411 Malignant neoplasm of upper lobe, right bronchus or lung: Secondary | ICD-10-CM

## 2021-05-07 DIAGNOSIS — Z95828 Presence of other vascular implants and grafts: Secondary | ICD-10-CM

## 2021-05-07 DIAGNOSIS — Z79899 Other long term (current) drug therapy: Secondary | ICD-10-CM | POA: Diagnosis not present

## 2021-05-07 DIAGNOSIS — Z7984 Long term (current) use of oral hypoglycemic drugs: Secondary | ICD-10-CM | POA: Diagnosis not present

## 2021-05-07 DIAGNOSIS — E119 Type 2 diabetes mellitus without complications: Secondary | ICD-10-CM | POA: Diagnosis not present

## 2021-05-07 DIAGNOSIS — Z5189 Encounter for other specified aftercare: Secondary | ICD-10-CM | POA: Diagnosis not present

## 2021-05-07 LAB — CMP (CANCER CENTER ONLY)
ALT: 13 U/L (ref 0–44)
AST: 14 U/L — ABNORMAL LOW (ref 15–41)
Albumin: 3 g/dL — ABNORMAL LOW (ref 3.5–5.0)
Alkaline Phosphatase: 110 U/L (ref 38–126)
Anion gap: 13 (ref 5–15)
BUN: 23 mg/dL — ABNORMAL HIGH (ref 6–20)
CO2: 21 mmol/L — ABNORMAL LOW (ref 22–32)
Calcium: 10.1 mg/dL (ref 8.9–10.3)
Chloride: 105 mmol/L (ref 98–111)
Creatinine: 1.64 mg/dL — ABNORMAL HIGH (ref 0.61–1.24)
GFR, Estimated: 48 mL/min — ABNORMAL LOW (ref >60)
Glucose, Bld: 206 mg/dL — ABNORMAL HIGH (ref 70–99)
Potassium: 4.4 mmol/L (ref 3.5–5.1)
Sodium: 139 mmol/L (ref 135–145)
Total Bilirubin: 0.5 mg/dL (ref 0.3–1.2)
Total Protein: 7.6 g/dL (ref 6.5–8.1)

## 2021-05-07 LAB — CBC WITH DIFFERENTIAL (CANCER CENTER ONLY)
Abs Immature Granulocytes: 0.13 10*3/uL — ABNORMAL HIGH (ref 0.00–0.07)
Basophils Absolute: 0.1 10*3/uL (ref 0.0–0.1)
Basophils Relative: 0 %
Eosinophils Absolute: 0 10*3/uL (ref 0.0–0.5)
Eosinophils Relative: 0 %
HCT: 29.7 % — ABNORMAL LOW (ref 39.0–52.0)
Hemoglobin: 9.7 g/dL — ABNORMAL LOW (ref 13.0–17.0)
Immature Granulocytes: 1 %
Lymphocytes Relative: 3 %
Lymphs Abs: 0.7 10*3/uL (ref 0.7–4.0)
MCH: 29.9 pg (ref 26.0–34.0)
MCHC: 32.7 g/dL (ref 30.0–36.0)
MCV: 91.7 fL (ref 80.0–100.0)
Monocytes Absolute: 1.4 10*3/uL — ABNORMAL HIGH (ref 0.1–1.0)
Monocytes Relative: 6 %
Neutro Abs: 22.7 10*3/uL — ABNORMAL HIGH (ref 1.7–7.7)
Neutrophils Relative %: 90 %
Platelet Count: 225 10*3/uL (ref 150–400)
RBC: 3.24 MIL/uL — ABNORMAL LOW (ref 4.22–5.81)
RDW: 17.4 % — ABNORMAL HIGH (ref 11.5–15.5)
WBC Count: 25 10*3/uL — ABNORMAL HIGH (ref 4.0–10.5)
nRBC: 0 % (ref 0.0–0.2)

## 2021-05-07 MED ORDER — DIPHENHYDRAMINE HCL 50 MG/ML IJ SOLN
INTRAMUSCULAR | Status: AC
  Filled 2021-05-07: qty 1

## 2021-05-07 MED ORDER — SODIUM CHLORIDE 0.9 % IV SOLN
Freq: Once | INTRAVENOUS | Status: AC
Start: 2021-05-07 — End: 2021-05-07
  Filled 2021-05-07: qty 250

## 2021-05-07 MED ORDER — ACETAMINOPHEN 325 MG PO TABS
ORAL_TABLET | ORAL | Status: AC
  Filled 2021-05-07: qty 2

## 2021-05-07 MED ORDER — ONDANSETRON HCL 4 MG/2ML IJ SOLN
INTRAMUSCULAR | Status: AC
  Filled 2021-05-07: qty 4

## 2021-05-07 MED ORDER — SODIUM CHLORIDE 0.9% FLUSH
10.0000 mL | Freq: Once | INTRAVENOUS | Status: AC
  Administered 2021-05-07: 10 mL
  Filled 2021-05-07: qty 10

## 2021-05-07 MED ORDER — SODIUM CHLORIDE 0.9 % IV SOLN
10.0000 mg | Freq: Once | INTRAVENOUS | Status: AC
  Administered 2021-05-07: 10 mg via INTRAVENOUS
  Filled 2021-05-07: qty 10

## 2021-05-07 MED ORDER — ACETAMINOPHEN 325 MG PO TABS
650.0000 mg | ORAL_TABLET | Freq: Once | ORAL | Status: AC
  Administered 2021-05-07: 650 mg via ORAL

## 2021-05-07 MED ORDER — SODIUM CHLORIDE 0.9 % IV SOLN
75.0000 mg/m2 | Freq: Once | INTRAVENOUS | Status: AC
  Administered 2021-05-07: 180 mg via INTRAVENOUS
  Filled 2021-05-07: qty 18

## 2021-05-07 MED ORDER — HEPARIN SOD (PORK) LOCK FLUSH 100 UNIT/ML IV SOLN
500.0000 [IU] | Freq: Once | INTRAVENOUS | Status: AC | PRN
  Administered 2021-05-07: 500 [IU]
  Filled 2021-05-07: qty 5

## 2021-05-07 MED ORDER — ONDANSETRON HCL 4 MG/2ML IJ SOLN
8.0000 mg | Freq: Once | INTRAMUSCULAR | Status: AC
  Administered 2021-05-07: 8 mg via INTRAVENOUS

## 2021-05-07 MED ORDER — SODIUM CHLORIDE 0.9 % IV SOLN
10.0000 mg/kg | Freq: Once | INTRAVENOUS | Status: AC
  Administered 2021-05-07: 1100 mg via INTRAVENOUS
  Filled 2021-05-07: qty 100

## 2021-05-07 MED ORDER — DIPHENHYDRAMINE HCL 50 MG/ML IJ SOLN
50.0000 mg | Freq: Once | INTRAMUSCULAR | Status: AC
  Administered 2021-05-07: 50 mg via INTRAVENOUS

## 2021-05-07 MED ORDER — SODIUM CHLORIDE 0.9% FLUSH
10.0000 mL | INTRAVENOUS | Status: DC | PRN
  Administered 2021-05-07: 10 mL
  Filled 2021-05-07: qty 10

## 2021-05-07 NOTE — Progress Notes (Signed)
Per MD Julien Nordmann, ok to treat with HR

## 2021-05-07 NOTE — Progress Notes (Signed)
Ok to treat with Creatinine today per MD Julien Nordmann

## 2021-05-07 NOTE — Progress Notes (Signed)
[facility] [phone]   [phone]  OFFICE PROGRESS NOTE  [name], DO [address] [place] [address]  DIAGNOSIS: Recurrent/progressive non-small cell lung cancer initially diagnosed as stage IIIA (T2a, N2, M0) non-small cell lung cancer, squamous cell carcinoma diagnosed in April 2021, presented with right upper lobe/suprahilar lung mass in addition to right paratracheal and subcarinal lymphadenopathy.  Molecular Studies by [facility]: No actionable mutations   PRIOR THERAPY:  1) Concurrent chemoradiation with weekly carboplatin for AUC of 2 and paclitaxel 45 NG/M2.  First dose on Mar 31, 2020.  Status post 7 cycles.  Last dose was given on 05/12/2020 with partial response. 2) Consolidation immunotherapy with durvalumab 1500 mg IV every 4 weeks.  First dose May 12, 2020. Status post 3 cycles.  3) Systemic chemotherapy with carboplatin for an AUC of 5, paclitaxel 175 mg/m2, and Keytruda 200 mg IV every 3 weeks. First dose expected on 10/02/20.   Status post 3 cycles.  Last dose was given on November 13, 2020. 4) SBRT to the enlarging right upper lobe lung nodules under the care of Dr. Lisbeth Renshaw.  CURRENT THERAPY:  Second line systemic chemotherapy with docetaxel 75 Mg/M2 and Cyramza 10 mg/KG every 3 weeks with Neulasta support.  First dose December 11, 2020.  Status post 7 cycles.   INTERVAL HISTORY: William Farrell 60 y.o. male returns to the clinic today for follow-up visit accompanied by his wife.  The patient is feeling fine today with no concerning complaints except for fatigue and mild nail changes.  He denied having any chest pain but continues to have shortness of breath with exertion with mild cough and no hemoptysis.  He also has postnasal drainage and treated with Flonase by his primary care physician.  The patient denied having any fever or chills.  He has no nausea, vomiting, diarrhea or constipation.  He has no  headache or visual changes.  He had CT scan of the chest, abdomen pelvis as well as MRI of the brain performed recently and he is here for evaluation and discussion of his imaging studies before starting cycle #8.  MEDICAL HISTORY: Past Medical History:  Diagnosis Date   Asthma    as a child   Diabetes ([facility])    Diverticulitis    GERD (gastroesophageal reflux disease)    High cholesterol    History of kidney stones    Lung cancer ([facility])    Wears glasses     ALLERGIES:  is allergic to sitagliptin.  MEDICATIONS:  Current Outpatient Medications  Medication Sig Dispense Refill   acetaminophen (TYLENOL) 500 MG tablet Take 1,000 mg by mouth every 6 (six) hours as needed for mild pain.     albuterol (VENTOLIN HFA) 108 (90 Base) MCG/ACT inhaler Inhale 1-2 puffs into the lungs every 4 (four) hours as needed for wheezing or shortness of breath. 18 g 99   aspirin EC 81 MG tablet Take 81 mg by mouth at bedtime.      atorvastatin (LIPITOR) 40 MG tablet TAKE 1 TABLET DAILY (Patient taking differently: Take 40 mg by mouth daily.) 90 tablet 3   benzonatate (TESSALON) 200 MG capsule Take 1 capsule (200 mg total) by mouth 3 (three) times daily as needed for cough. 90 capsule 1   calcium carbonate (TUMS - DOSED IN MG ELEMENTAL CALCIUM) 500 MG chewable tablet Chew 2 tablets by mouth 3 (three) times daily as needed for indigestion or heartburn.  dexamethasone (DECADRON) 4 MG tablet TAKE 1 TABLET BY MOUTH TWICE DAILY THE DAY BEFORE, DAY OF AND DAY AFTER CHEMOTHERAPY EVERY 3 WEEKS. (Patient taking differently: Take 4 mg by mouth See admin instructions. TAKE 1 TABLET BY MOUTH TWICE DAILY THE DAY BEFORE, DAY OF AND DAY AFTER CHEMOTHERAPY EVERY 3 WEEKS.) 40 tablet 0   Ferrous Fumarate-Vitamin C ER 65-25 MG TBCR Take 1 tablet by mouth daily.     glipiZIDE (GLUCOTROL) 5 MG tablet Take 1 tablet (5 mg total) by mouth 2 (two) times daily before a meal. 180 tablet 3   Lancets (ONETOUCH DELICA PLUS [id number]) MISC USE  AS DIRECTED UP TO FOUR TIMES A DAY 100 each 13   levothyroxine (SYNTHROID) 75 MCG tablet TAKE 1 TABLET BY MOUTH EVERY DAY BEFORE BREAKFAST 30 tablet 1   lidocaine-prilocaine (EMLA) cream Apply 1 application topically as needed. (Patient taking differently: Apply 1 application topically as needed (port access).) 30 g 2   loratadine (CLARITIN) 10 MG tablet Take 10 mg by mouth daily as needed for allergies (after chemo shot).     LORazepam (ATIVAN) 0.5 MG tablet Take 1 tablet 30 minutes before your MRI 2 tablet 0   meloxicam (MOBIC) 15 MG tablet TAKE 1 TABLET DAILY (Patient taking differently: Take 15 mg by mouth daily.) 90 tablet 3   metFORMIN (GLUCOPHAGE) 1000 MG tablet TAKE 1 TABLET TWICE A DAY WITH MEALS (Patient taking differently: Take 1,000 mg by mouth 2 (two) times daily with a meal.) 180 tablet 3   ondansetron (ZOFRAN) 8 MG tablet TAKE 1 TABLET BY MOUTH EVERY 8 HOURS AS NEEDED FOR NAUSEA AND VOMITING 9 tablet 2   ONETOUCH ULTRA test strip USE AS INSTRUCTED UP TO FOUR TIMES A DAY 100 strip 13   pantoprazole (PROTONIX) 40 MG tablet Take 1 tablet (40 mg total) by mouth 2 (two) times daily. 180 tablet 3   Semaglutide,0.25 or 0.5MG /DOS, (OZEMPIC, 0.25 OR 0.5 MG/DOSE,) 2 MG/1.5ML SOPN Inject 0.5 mg into the skin once a week. 7.5 mL 1   temazepam (RESTORIL) 15 MG capsule Take 1 capsule (15 mg total) by mouth at bedtime as needed for sleep. 30 capsule 0   Tiotropium Bromide-Olodaterol (STIOLTO RESPIMAT) 2.5-2.5 MCG/ACT AERS Inhale 2 puffs into the lungs daily. 3 each 3   Tiotropium Bromide-Olodaterol (STIOLTO RESPIMAT) 2.5-2.5 MCG/ACT AERS Inhale 2 puffs into the lungs daily. 4 g 0   trolamine salicylate (ASPERCREME) 10 % cream Apply 1 application topically 2 (two) times daily as needed for muscle pain.      No current facility-administered medications for this visit.    SURGICAL HISTORY:  Past Surgical History:  Procedure Laterality Date   BIOPSY  03/25/2021   Procedure: BIOPSY;  Surgeon:  Lavena Bullion, DO;  Location: [facility];  Service: Gastroenterology;;   broken bone repair     BRONCHIAL BIOPSY  03/11/2020   Procedure: BRONCHIAL BIOPSIES;  Surgeon: Garner Nash, DO;  Location: [facility];  Service: Pulmonary;;   COLONOSCOPY     around [place]. [place]    CRYOTHERAPY  03/11/2020   Procedure: CRYOTHERAPY;  Surgeon: Garner Nash, DO;  Location: [facility];  Service: Pulmonary;;   ESOPHAGEAL DILATION  03/25/2021   Procedure: ESOPHAGEAL DILATION;  Surgeon: Lavena Bullion, DO;  Location: [facility];  Service: Gastroenterology;;   ESOPHAGOGASTRODUODENOSCOPY (EGD) WITH PROPOFOL N/A 03/25/2021   Procedure: ESOPHAGOGASTRODUODENOSCOPY (EGD) WITH PROPOFOL;  Surgeon: Lavena Bullion, DO;  Location: [facility];  Service: Gastroenterology;  Laterality: N/A;  dialation of stricture   FINE NEEDLE ASPIRATION  03/11/2020   Procedure: FINE NEEDLE ASPIRATION (FNA) LINEAR;  Surgeon: Garner Nash, DO;  Location: [facility];  Service: Pulmonary;;   IR IMAGING GUIDED PORT INSERTION  10/16/2020   KNEE ARTHROSCOPY WITH ANTERIOR CRUCIATE LIGAMENT (ACL) REPAIR     x 2   VIDEO BRONCHOSCOPY WITH ENDOBRONCHIAL ULTRASOUND N/A 03/11/2020   Procedure: VIDEO BRONCHOSCOPY WITH ENDOBRONCHIAL ULTRASOUND;  Surgeon: Garner Nash, DO;  Location: [facility];  Service: Pulmonary;  Laterality: N/A;    REVIEW OF SYSTEMS:  Constitutional: positive for fatigue Eyes: negative Ears, nose, mouth, throat, and face: negative Respiratory: positive for cough and dyspnea on exertion Cardiovascular: negative Gastrointestinal: negative Genitourinary:negative Integument/breast: negative for rash Hematologic/lymphatic: negative Musculoskeletal:negative Neurological: negative Behavioral/Psych: negative Endocrine: negative Allergic/Immunologic: negative   PHYSICAL EXAMINATION: General appearance: alert, cooperative, fatigued, and no distress Head: Normocephalic, without obvious  abnormality, atraumatic Neck: no adenopathy, no JVD, supple, symmetrical, trachea midline, and thyroid not enlarged, symmetric, no tenderness/mass/nodules Lymph nodes: Cervical, supraclavicular, and axillary nodes normal. Resp: clear to auscultation bilaterally Back: symmetric, no curvature. ROM normal. No CVA tenderness. Cardio: regular rate and rhythm, S1, S2 normal, no murmur, click, rub or gallop GI: soft, non-tender; bowel sounds normal; no masses,  no organomegaly Extremities: extremities normal, atraumatic, no cyanosis or edema Neurologic: Alert and oriented X 3, normal strength and tone. Normal symmetric reflexes. Normal coordination and gait  ECOG PERFORMANCE STATUS: 1 - Symptomatic but completely ambulatory  Blood pressure 110/71, pulse (!) 121, temperature 97.7 F (36.5 C), temperature source Tympanic, resp. rate 18, height 6' (1.829 m), weight 231 lb (104.8 kg), SpO2 99 %.  LABORATORY DATA: Lab Results  Component Value Date   WBC 25.0 (H) 05/07/2021   HGB 9.7 (L) 05/07/2021   HCT 29.7 (L) 05/07/2021   MCV 91.7 05/07/2021   PLT 225 05/07/2021      Chemistry      Component Value Date/Time   NA 137 04/30/2021 0816   K 4.6 04/30/2021 0816   CL 102 04/30/2021 0816   CO2 25 04/30/2021 0816   BUN 15 04/30/2021 0816   CREATININE 1.41 (H) 04/30/2021 0816   CREATININE 1.10 01/14/2020 0859      Component Value Date/Time   CALCIUM 9.8 04/30/2021 0816   ALKPHOS 139 (H) 04/30/2021 0816   AST 15 04/30/2021 0816   ALT 14 04/30/2021 0816   BILITOT 0.4 04/30/2021 0816       RADIOGRAPHIC STUDIES: DG Chest 2 View  Result Date: 04/08/2021 CLINICAL DATA:  , History of lung cancer EXAM: CHEST - 2 VIEW COMPARISON:  02/09/2021 FINDINGS: Left-sided chest port remains in place terminating at the level of the distal SVC. Stable heart size. Irregular nodular densities within the right upper lobe and right suprahilar regions, grossly similar compared to the recent CT, when accounting  for differences in technique. No new airspace opacity. Left lung remains clear. No pleural effusion or pneumothorax. No acute bony findings. IMPRESSION: Irregular nodular densities within the right upper lobe and right suprahilar regions, grossly similar compared to recent CT. No new airspace opacity. Electronically Signed   By: Davina Poke D.O.   On: 04/08/2021 10:20   CT Chest W Contrast  Result Date: 04/20/2021 CLINICAL DATA:  Stage IV lung cancer post radiation therapy. Chemotherapy ongoing. EXAM: CT CHEST, ABDOMEN, AND PELVIS WITH CONTRAST TECHNIQUE: Multidetector CT imaging of the chest, abdomen and pelvis was performed following the standard protocol during bolus administration of intravenous contrast. CONTRAST:  19mL OMNIPAQUE IOHEXOL 300 MG/ML  SOLN COMPARISON:  Prior CTs 02/09/2021 and 12/03/2020. PET-CT 09/16/2020. FINDINGS: CT CHEST FINDINGS Cardiovascular: Left IJ Port-A-Cath tip near the superior cavoatrial junction. No acute vascular findings. The heart size is normal. There is a stable small pericardial effusion. Mediastinum/Nodes: Previously demonstrated thoracic adenopathy does not appear significantly changed, including a 16 mm right supraclavicular node on image 5/2, a 10 mm right paratracheal node on image 19/2 and a 13 mm subcarinal node on image 28/2. There is a stable small right subpectoral node on image 9/2. No progressive adenopathy. The thyroid gland, trachea and esophagus demonstrate no significant findings. Lungs/Pleura: Stable small right-sided pleural effusion, partially loculated posteriorly. There are stable radiation changes and volume loss medially in the right lung. The right suprahilar mass measures 4.2 x 3.3 cm on image 53/3 (previously 3.6 x 2.7 cm). The more superior right upper lobe nodule has decreased in size, now measuring 2.5 x 1.7 cm on image 32/3 (previously 3.0 x 2.0 cm). Left apical subpleural nodularity appears slightly more conspicuous (image 20/3)  although likely represents scarring based on prior studies. No suspicious left lung nodularity. Musculoskeletal/Chest wall: No chest wall mass or suspicious osseous findings. CT ABDOMEN AND PELVIS FINDINGS Hepatobiliary: The liver is normal in density without suspicious focal abnormality. No evidence of gallstones, gallbladder wall thickening or biliary dilatation. Pancreas: Unremarkable. No pancreatic ductal dilatation or surrounding inflammatory changes. Spleen: Normal in size without focal abnormality. Adrenals/Urinary Tract: Both adrenal glands appear normal. Stable symmetric perinephric soft tissue stranding bilaterally and tiny nonobstructing right renal calculus. No evidence of renal mass, ureteral calculus or hydronephrosis. The bladder appears unremarkable for its degree of distention. Stomach/Bowel: Enteric contrast was administered and has passed into the distal small bowel. The stomach appears unremarkable for its degree of distension. No evidence of bowel wall thickening, distention or surrounding inflammatory change. The appendix appears normal. Mild distal colonic diverticulosis. Vascular/Lymphatic: There are no enlarged abdominal or pelvic lymph nodes. Small retroperitoneal lymph nodes are stable. No acute vascular findings. Mild aortic and branch vessel atherosclerosis. Reproductive: The prostate gland and seminal vesicles appear normal. Other: No ascites or peritoneal nodularity. Stable prominent fat in both inguinal canals Musculoskeletal: No acute or significant osseous findings. Stable old fracture of the right L3 transverse process and lower lumbar spondylosis. IMPRESSION: 1. The dominant right suprahilar mass measures slightly larger (4.2 x 3.3 cm compared with 3.6 x 2.7 cm previously). As this is in the radiation port, this difference could reflect sequela of radiation therapy. Local recurrence is difficult to completely exclude, and continued follow-up recommended. Follow-up PET-CT could be  performed if clinically warranted. 2. The nodules more superiorly in the right upper lobe appear slightly smaller. The mediastinal and right supraclavicular adenopathy has not significantly changed. No progressive distant metastases identified. 3. No evidence of metastatic disease within the abdomen or pelvis. 4.  Aortic Atherosclerosis (ICD10-I70.0). Electronically Signed   By: Richardean Sale M.D.   On: 04/20/2021 13:10   MR Brain W Wo Contrast  Result Date: 05/05/2021 CLINICAL DATA:  New headaches.  History of metastatic lung cancer. EXAM: MRI HEAD WITHOUT AND WITH CONTRAST TECHNIQUE: Multiplanar, multiecho pulse sequences of the brain and surrounding structures were obtained without and with intravenous contrast. CONTRAST:  45mL GADAVIST GADOBUTROL 1 MMOL/ML IV SOLN COMPARISON:  03/22/2020 FINDINGS: Brain: There is no evidence of an acute infarct, intracranial hemorrhage, mass, midline shift, or extra-axial fluid collection. The ventricles and sulci are normal. No significant white matter disease is  seen for age. No abnormal enhancement is identified. Vascular: Major intracranial vascular flow voids are preserved. Unchanged 8 mm focus of mixed T1 and T2 signal intensity adjacent to the distal aspect of the left petrous ICA, indeterminate for a potentially partially thrombosed aneurysm. Skull and upper cervical spine: Unremarkable bone marrow signal. Sinuses/Orbits: Unremarkable orbits. Minimal mucosal thickening in the paranasal sinuses. Mildly decreased size of a small left mastoid effusion. Other: None. IMPRESSION: 1. Unremarkable appearance of the brain for age. No evidence of intracranial metastases. 2. 8 mm focus of signal abnormality adjacent to the distal left petrous ICA. Head CTA is recommended to evaluate for an aneurysm. Electronically Signed   By: Logan Bores M.D.   On: 05/05/2021 08:02   CT Abdomen Pelvis W Contrast  Result Date: 04/20/2021 CLINICAL DATA:  Stage IV lung cancer post radiation  therapy. Chemotherapy ongoing. EXAM: CT CHEST, ABDOMEN, AND PELVIS WITH CONTRAST TECHNIQUE: Multidetector CT imaging of the chest, abdomen and pelvis was performed following the standard protocol during bolus administration of intravenous contrast. CONTRAST:  153mL OMNIPAQUE IOHEXOL 300 MG/ML  SOLN COMPARISON:  Prior CTs 02/09/2021 and 12/03/2020. PET-CT 09/16/2020. FINDINGS: CT CHEST FINDINGS Cardiovascular: Left IJ Port-A-Cath tip near the superior cavoatrial junction. No acute vascular findings. The heart size is normal. There is a stable small pericardial effusion. Mediastinum/Nodes: Previously demonstrated thoracic adenopathy does not appear significantly changed, including a 16 mm right supraclavicular node on image 5/2, a 10 mm right paratracheal node on image 19/2 and a 13 mm subcarinal node on image 28/2. There is a stable small right subpectoral node on image 9/2. No progressive adenopathy. The thyroid gland, trachea and esophagus demonstrate no significant findings. Lungs/Pleura: Stable small right-sided pleural effusion, partially loculated posteriorly. There are stable radiation changes and volume loss medially in the right lung. The right suprahilar mass measures 4.2 x 3.3 cm on image 53/3 (previously 3.6 x 2.7 cm). The more superior right upper lobe nodule has decreased in size, now measuring 2.5 x 1.7 cm on image 32/3 (previously 3.0 x 2.0 cm). Left apical subpleural nodularity appears slightly more conspicuous (image 20/3) although likely represents scarring based on prior studies. No suspicious left lung nodularity. Musculoskeletal/Chest wall: No chest wall mass or suspicious osseous findings. CT ABDOMEN AND PELVIS FINDINGS Hepatobiliary: The liver is normal in density without suspicious focal abnormality. No evidence of gallstones, gallbladder wall thickening or biliary dilatation. Pancreas: Unremarkable. No pancreatic ductal dilatation or surrounding inflammatory changes. Spleen: Normal in size  without focal abnormality. Adrenals/Urinary Tract: Both adrenal glands appear normal. Stable symmetric perinephric soft tissue stranding bilaterally and tiny nonobstructing right renal calculus. No evidence of renal mass, ureteral calculus or hydronephrosis. The bladder appears unremarkable for its degree of distention. Stomach/Bowel: Enteric contrast was administered and has passed into the distal small bowel. The stomach appears unremarkable for its degree of distension. No evidence of bowel wall thickening, distention or surrounding inflammatory change. The appendix appears normal. Mild distal colonic diverticulosis. Vascular/Lymphatic: There are no enlarged abdominal or pelvic lymph nodes. Small retroperitoneal lymph nodes are stable. No acute vascular findings. Mild aortic and branch vessel atherosclerosis. Reproductive: The prostate gland and seminal vesicles appear normal. Other: No ascites or peritoneal nodularity. Stable prominent fat in both inguinal canals Musculoskeletal: No acute or significant osseous findings. Stable old fracture of the right L3 transverse process and lower lumbar spondylosis. IMPRESSION: 1. The dominant right suprahilar mass measures slightly larger (4.2 x 3.3 cm compared with 3.6 x 2.7 cm previously). As this  is in the radiation port, this difference could reflect sequela of radiation therapy. Local recurrence is difficult to completely exclude, and continued follow-up recommended. Follow-up PET-CT could be performed if clinically warranted. 2. The nodules more superiorly in the right upper lobe appear slightly smaller. The mediastinal and right supraclavicular adenopathy has not significantly changed. No progressive distant metastases identified. 3. No evidence of metastatic disease within the abdomen or pelvis. 4.  Aortic Atherosclerosis (ICD10-I70.0). Electronically Signed   By: Richardean Sale M.D.   On: 04/20/2021 13:10    ASSESSMENT AND PLAN: This is a 59 years old white  male was recently diagnosed with stage IIIa non-small cell lung cancer, squamous cell carcinoma in April 2021 presented with right upper lobe/suprahilar lung mass in addition to right paratracheal and subcarinal lymphadenopathy. The patient completed a course of concurrent chemoradiation with weekly carboplatin for AUC of 2 and paclitaxel 45 MG/M2.  He is status post 7 cycles.  He tolerated the previous course of his treatment well except for mild dysphagia and odynophagia. He underwent consolidation treatment with immunotherapy with Imfinzi 1500 mg IV every 4 weeks.  Status post 3 cycles.  The patient tolerated the treatment well but unfortunately he has evidence for disease progression after cycle #3. He is started first-line treatment with chemotherapy with carboplatin for AUC of 5, paclitaxel 175 mg/M2 and Keytruda 200 mg IV every 3 weeks.  Status post 3 cycles.  He has been tolerating this treatment well with no concerning adverse effects except for mild fatigue. The patient had repeat CT scan of the chest, abdomen pelvis performed recently.  I personally and independently reviewed the scan images and discussed the result and showed the images to the patient today. Unfortunately his scan showed enlarging nodules and masses in the chest with enlarging lymph nodes.  This is definitely highly suspicious for disease progression but to the progression on immunotherapy could not be completely excluded. He is currently undergoing palliative systemic chemotherapy with second line docetaxel 75 Mg/M2 and Cyramza 10 mg/KG every 3 weeks with Neulasta support.  Status post 7 cycles.   The patient continues to tolerate his treatment well.  He also underwent palliative radiotherapy to the enlarging right upper lobe lung mass under the care of Dr. Lisbeth Renshaw. The patient has been tolerating his treatment with docetaxel and Cyramza fairly well except for fatigue and some nail changes. He had CT scan of the chest, abdomen  pelvis performed few weeks ago that showed no clear evidence for progression. The patient also had MRI of the brain that showed no evidence for metastatic disease to the brain but he has suspicious aneurysm.  The patient had similar problem since 2009 when he presented with headache and [place].  He would like to continue monitoring it for now. I recommended for him to proceed with cycle #8 today as planned He will come back for follow-up visit in 3 weeks for evaluation before starting cycle #9. For the diabetes mellitus he is currently on Ozempic and he will follow-up with his primary care physician for regulation of his medication. For the insomnia, he will continue on Restoril 15 mg p.o. nightly as needed. He was advised to call immediately if he has any concerning symptoms in the interval The patient voices understanding of current disease status and treatment options and is in agreement with the current care plan. All questions were answered. The patient knows to call the clinic with any problems, questions or concerns. We can certainly see the patient  much sooner if necessary.  Disclaimer: This note was dictated with voice recognition software. Similar sounding words can inadvertently be transcribed and may not be corrected upon review.

## 2021-05-08 ENCOUNTER — Inpatient Hospital Stay: Payer: BC Managed Care – PPO

## 2021-05-08 VITALS — BP 110/71 | HR 109 | Temp 97.6°F | Resp 19

## 2021-05-08 DIAGNOSIS — Z5112 Encounter for antineoplastic immunotherapy: Secondary | ICD-10-CM | POA: Diagnosis not present

## 2021-05-08 DIAGNOSIS — Z5189 Encounter for other specified aftercare: Secondary | ICD-10-CM | POA: Diagnosis not present

## 2021-05-08 DIAGNOSIS — C3411 Malignant neoplasm of upper lobe, right bronchus or lung: Secondary | ICD-10-CM

## 2021-05-08 DIAGNOSIS — Z79899 Other long term (current) drug therapy: Secondary | ICD-10-CM | POA: Diagnosis not present

## 2021-05-08 DIAGNOSIS — Z7982 Long term (current) use of aspirin: Secondary | ICD-10-CM | POA: Diagnosis not present

## 2021-05-08 DIAGNOSIS — Z5111 Encounter for antineoplastic chemotherapy: Secondary | ICD-10-CM | POA: Diagnosis not present

## 2021-05-08 DIAGNOSIS — E119 Type 2 diabetes mellitus without complications: Secondary | ICD-10-CM | POA: Diagnosis not present

## 2021-05-08 DIAGNOSIS — Z7984 Long term (current) use of oral hypoglycemic drugs: Secondary | ICD-10-CM | POA: Diagnosis not present

## 2021-05-08 MED ORDER — PEGFILGRASTIM-CBQV 6 MG/0.6ML ~~LOC~~ SOSY
6.0000 mg | PREFILLED_SYRINGE | Freq: Once | SUBCUTANEOUS | Status: AC
  Administered 2021-05-08: 6 mg via SUBCUTANEOUS

## 2021-05-08 MED ORDER — PEGFILGRASTIM-CBQV 6 MG/0.6ML ~~LOC~~ SOSY
PREFILLED_SYRINGE | SUBCUTANEOUS | Status: AC
  Filled 2021-05-08: qty 0.6

## 2021-05-08 NOTE — Patient Instructions (Signed)

## 2021-05-09 ENCOUNTER — Ambulatory Visit: Payer: BC Managed Care – PPO

## 2021-05-11 ENCOUNTER — Encounter: Payer: Self-pay | Admitting: Internal Medicine

## 2021-05-11 ENCOUNTER — Other Ambulatory Visit: Payer: Self-pay

## 2021-05-11 ENCOUNTER — Other Ambulatory Visit: Payer: Self-pay | Admitting: Internal Medicine

## 2021-05-11 ENCOUNTER — Ambulatory Visit
Admission: RE | Admit: 2021-05-11 | Discharge: 2021-05-11 | Disposition: A | Discharge status: Home / Self Care | Payer: BC Managed Care – PPO | Source: Ambulatory Visit | Attending: Radiation Oncology | Admitting: Radiation Oncology

## 2021-05-11 DIAGNOSIS — R112 Nausea with vomiting, unspecified: Secondary | ICD-10-CM

## 2021-05-11 DIAGNOSIS — C3411 Malignant neoplasm of upper lobe, right bronchus or lung: Secondary | ICD-10-CM

## 2021-05-12 ENCOUNTER — Encounter: Payer: Self-pay | Admitting: Internal Medicine

## 2021-05-12 ENCOUNTER — Other Ambulatory Visit: Payer: Self-pay | Admitting: Medical Oncology

## 2021-05-12 NOTE — Telephone Encounter (Signed)
Discussed with Dr. Julien Nordmann who advises for pt to continue to hydrate, take Tylenol for his HA and monitor for any fever or worsening sx. If he worsens, go to the ER.  I have spoken with pts wife and advised as indicated. She expressed understanding of this information.

## 2021-05-12 NOTE — Progress Notes (Signed)
  Radiation Oncology         [phone]) [phone] ________________________________  Name: William Farrell MRN: [id number]  Date of Service: 05/11/2021  DOB: 19-Oct-1961  Post Treatment Telephone Note  Diagnosis:   Progressive Stage IIIA, cT2aN2M0, NSCLC, squamous cell carcinoma of the RUL.  Interval Since Last Radiation:  7 weeks   03/17/2021 through 03/24/2021 Site Technique Total Dose (Gy) Dose per Fx (Gy) Completed Fx Beam Energies  Lung, Right: Lung_RUL IMRT 48/48 12 4/4 6XFFF    Narrative:  The patient was contacted today for routine follow-up. During treatment she did very well with radiotherapy and did not have significant desquamation. He reports he has had more shortness of breath lately with exertion and will see pulmonary medicine in about a week and a half. His swallowing is improved after esophageal dilation.   Impression/Plan: 1. Progressive Stage IIIA, cT2aN2M0, NSCLC, squamous cell carcinoma of the RUL. The patient has been doing well since completion of radiotherapy. We discussed that we would be happy to continue to follow him as needed, but he will also continue to follow up with Dr. Julien Nordmann in medical oncology.       Carola Rhine, PAC

## 2021-05-13 ENCOUNTER — Other Ambulatory Visit: Payer: Self-pay | Admitting: Medical Oncology

## 2021-05-14 ENCOUNTER — Other Ambulatory Visit: Payer: Self-pay

## 2021-05-14 ENCOUNTER — Inpatient Hospital Stay: Payer: BC Managed Care – PPO

## 2021-05-14 DIAGNOSIS — C3411 Malignant neoplasm of upper lobe, right bronchus or lung: Secondary | ICD-10-CM

## 2021-05-14 DIAGNOSIS — Z5189 Encounter for other specified aftercare: Secondary | ICD-10-CM | POA: Diagnosis not present

## 2021-05-14 DIAGNOSIS — Z7982 Long term (current) use of aspirin: Secondary | ICD-10-CM | POA: Diagnosis not present

## 2021-05-14 DIAGNOSIS — E119 Type 2 diabetes mellitus without complications: Secondary | ICD-10-CM | POA: Diagnosis not present

## 2021-05-14 DIAGNOSIS — Z5112 Encounter for antineoplastic immunotherapy: Secondary | ICD-10-CM | POA: Diagnosis not present

## 2021-05-14 DIAGNOSIS — Z79899 Other long term (current) drug therapy: Secondary | ICD-10-CM | POA: Diagnosis not present

## 2021-05-14 DIAGNOSIS — Z7984 Long term (current) use of oral hypoglycemic drugs: Secondary | ICD-10-CM | POA: Diagnosis not present

## 2021-05-14 DIAGNOSIS — Z5111 Encounter for antineoplastic chemotherapy: Secondary | ICD-10-CM | POA: Diagnosis not present

## 2021-05-14 LAB — TSH: TSH: 3.64 u[IU]/mL (ref 0.320–4.118)

## 2021-05-14 LAB — CMP (CANCER CENTER ONLY)
ALT: 9 U/L (ref 0–44)
AST: 10 U/L — ABNORMAL LOW (ref 15–41)
Albumin: 3.8 g/dL (ref 3.5–5.0)
Alkaline Phosphatase: 151 U/L — ABNORMAL HIGH (ref 38–126)
Anion gap: 10 (ref 5–15)
BUN: 18 mg/dL (ref 6–20)
CO2: 24 mmol/L (ref 22–32)
Calcium: 10 mg/dL (ref 8.9–10.3)
Chloride: 101 mmol/L (ref 98–111)
Creatinine: 1.18 mg/dL (ref 0.61–1.24)
GFR, Estimated: >60 mL/min (ref >60)
Glucose, Bld: 183 mg/dL — ABNORMAL HIGH (ref 70–99)
Potassium: 4.3 mmol/L (ref 3.5–5.1)
Sodium: 135 mmol/L (ref 135–145)
Total Bilirubin: 0.4 mg/dL (ref 0.3–1.2)
Total Protein: 6.9 g/dL (ref 6.5–8.1)

## 2021-05-14 LAB — CBC WITH DIFFERENTIAL (CANCER CENTER ONLY)
Abs Immature Granulocytes: 0.2 10*3/uL — ABNORMAL HIGH (ref 0.00–0.07)
Basophils Absolute: 0.1 10*3/uL (ref 0.0–0.1)
Basophils Relative: 1 %
Eosinophils Absolute: 0 10*3/uL (ref 0.0–0.5)
Eosinophils Relative: 0 %
HCT: 30.2 % — ABNORMAL LOW (ref 39.0–52.0)
Hemoglobin: 9.9 g/dL — ABNORMAL LOW (ref 13.0–17.0)
Immature Granulocytes: 2 %
Lymphocytes Relative: 8 %
Lymphs Abs: 1.1 10*3/uL (ref 0.7–4.0)
MCH: 30 pg (ref 26.0–34.0)
MCHC: 32.8 g/dL (ref 30.0–36.0)
MCV: 91.5 fL (ref 80.0–100.0)
Monocytes Absolute: 1.2 10*3/uL — ABNORMAL HIGH (ref 0.1–1.0)
Monocytes Relative: 9 %
Neutro Abs: 10.3 10*3/uL — ABNORMAL HIGH (ref 1.7–7.7)
Neutrophils Relative %: 80 %
Platelet Count: 238 10*3/uL (ref 150–400)
RBC: 3.3 MIL/uL — ABNORMAL LOW (ref 4.22–5.81)
RDW: 17.2 % — ABNORMAL HIGH (ref 11.5–15.5)
WBC Count: 13 10*3/uL — ABNORMAL HIGH (ref 4.0–10.5)
nRBC: 0 % (ref 0.0–0.2)

## 2021-05-14 LAB — TOTAL PROTEIN, URINE DIPSTICK: Protein, ur: 100 mg/dL — AB

## 2021-05-21 ENCOUNTER — Other Ambulatory Visit: Payer: Self-pay

## 2021-05-21 ENCOUNTER — Inpatient Hospital Stay: Payer: BC Managed Care – PPO | Attending: Internal Medicine

## 2021-05-21 DIAGNOSIS — Z5112 Encounter for antineoplastic immunotherapy: Secondary | ICD-10-CM | POA: Diagnosis not present

## 2021-05-21 DIAGNOSIS — C3411 Malignant neoplasm of upper lobe, right bronchus or lung: Secondary | ICD-10-CM | POA: Diagnosis not present

## 2021-05-21 DIAGNOSIS — Z5111 Encounter for antineoplastic chemotherapy: Secondary | ICD-10-CM | POA: Diagnosis not present

## 2021-05-21 DIAGNOSIS — Z7982 Long term (current) use of aspirin: Secondary | ICD-10-CM | POA: Diagnosis not present

## 2021-05-21 DIAGNOSIS — E119 Type 2 diabetes mellitus without complications: Secondary | ICD-10-CM | POA: Diagnosis not present

## 2021-05-21 DIAGNOSIS — Z794 Long term (current) use of insulin: Secondary | ICD-10-CM | POA: Diagnosis not present

## 2021-05-21 DIAGNOSIS — Z79899 Other long term (current) drug therapy: Secondary | ICD-10-CM | POA: Diagnosis not present

## 2021-05-21 DIAGNOSIS — Z5189 Encounter for other specified aftercare: Secondary | ICD-10-CM | POA: Insufficient documentation

## 2021-05-21 LAB — CMP (CANCER CENTER ONLY)
ALT: 10 U/L (ref 0–44)
AST: 13 U/L — ABNORMAL LOW (ref 15–41)
Albumin: 3.7 g/dL (ref 3.5–5.0)
Alkaline Phosphatase: 128 U/L — ABNORMAL HIGH (ref 38–126)
Anion gap: 9 (ref 5–15)
BUN: 24 mg/dL — ABNORMAL HIGH (ref 6–20)
CO2: 27 mmol/L (ref 22–32)
Calcium: 9.4 mg/dL (ref 8.9–10.3)
Chloride: 100 mmol/L (ref 98–111)
Creatinine: 1.45 mg/dL — ABNORMAL HIGH (ref 0.61–1.24)
GFR, Estimated: 55 mL/min — ABNORMAL LOW (ref >60)
Glucose, Bld: 124 mg/dL — ABNORMAL HIGH (ref 70–99)
Potassium: 5 mmol/L (ref 3.5–5.1)
Sodium: 136 mmol/L (ref 135–145)
Total Bilirubin: 0.4 mg/dL (ref 0.3–1.2)
Total Protein: 6.6 g/dL (ref 6.5–8.1)

## 2021-05-21 LAB — CBC WITH DIFFERENTIAL (CANCER CENTER ONLY)
Abs Immature Granulocytes: 0.1 10*3/uL — ABNORMAL HIGH (ref 0.00–0.07)
Basophils Absolute: 0.1 10*3/uL (ref 0.0–0.1)
Basophils Relative: 1 %
Eosinophils Absolute: 0.1 10*3/uL (ref 0.0–0.5)
Eosinophils Relative: 1 %
HCT: 34 % — ABNORMAL LOW (ref 39.0–52.0)
Hemoglobin: 10.7 g/dL — ABNORMAL LOW (ref 13.0–17.0)
Immature Granulocytes: 1 %
Lymphocytes Relative: 8 %
Lymphs Abs: 1.3 10*3/uL (ref 0.7–4.0)
MCH: 29.9 pg (ref 26.0–34.0)
MCHC: 31.5 g/dL (ref 30.0–36.0)
MCV: 95 fL (ref 80.0–100.0)
Monocytes Absolute: 0.9 10*3/uL (ref 0.1–1.0)
Monocytes Relative: 5 %
Neutro Abs: 14.3 10*3/uL — ABNORMAL HIGH (ref 1.7–7.7)
Neutrophils Relative %: 84 %
Platelet Count: 144 10*3/uL — ABNORMAL LOW (ref 150–400)
RBC: 3.58 MIL/uL — ABNORMAL LOW (ref 4.22–5.81)
RDW: 18 % — ABNORMAL HIGH (ref 11.5–15.5)
WBC Count: 16.7 10*3/uL — ABNORMAL HIGH (ref 4.0–10.5)
nRBC: 0.1 % (ref 0.0–0.2)

## 2021-05-24 ENCOUNTER — Other Ambulatory Visit: Payer: Self-pay | Admitting: Internal Medicine

## 2021-05-24 DIAGNOSIS — R112 Nausea with vomiting, unspecified: Secondary | ICD-10-CM

## 2021-05-25 ENCOUNTER — Encounter: Payer: Self-pay | Admitting: Internal Medicine

## 2021-05-25 ENCOUNTER — Other Ambulatory Visit: Payer: Self-pay

## 2021-05-25 ENCOUNTER — Encounter: Payer: Self-pay | Admitting: Pulmonary Disease

## 2021-05-25 ENCOUNTER — Ambulatory Visit (INDEPENDENT_AMBULATORY_CARE_PROVIDER_SITE_OTHER): Payer: BC Managed Care – PPO | Admitting: Pulmonary Disease

## 2021-05-25 ENCOUNTER — Other Ambulatory Visit: Payer: Self-pay | Admitting: Internal Medicine

## 2021-05-25 VITALS — BP 104/58 | HR 118 | Ht 72.0 in | Wt 223.0 lb

## 2021-05-25 DIAGNOSIS — R0600 Dyspnea, unspecified: Secondary | ICD-10-CM

## 2021-05-25 DIAGNOSIS — F17210 Nicotine dependence, cigarettes, uncomplicated: Secondary | ICD-10-CM | POA: Diagnosis not present

## 2021-05-25 DIAGNOSIS — R590 Localized enlarged lymph nodes: Secondary | ICD-10-CM | POA: Diagnosis not present

## 2021-05-25 DIAGNOSIS — J432 Centrilobular emphysema: Secondary | ICD-10-CM

## 2021-05-25 DIAGNOSIS — R059 Cough, unspecified: Secondary | ICD-10-CM | POA: Diagnosis not present

## 2021-05-25 DIAGNOSIS — C3411 Malignant neoplasm of upper lobe, right bronchus or lung: Secondary | ICD-10-CM

## 2021-05-25 DIAGNOSIS — R0609 Other forms of dyspnea: Secondary | ICD-10-CM

## 2021-05-25 MED ORDER — ALBUTEROL SULFATE HFA 108 (90 BASE) MCG/ACT IN AERS: 1.0000 | INHALATION_SPRAY | RESPIRATORY_TRACT | 3 refills | Status: DC | PRN

## 2021-05-25 MED ORDER — BREZTRI AEROSPHERE 160-9-4.8 MCG/ACT IN AERO: 2.0000 | INHALATION_SPRAY | Freq: Two times a day (BID) | RESPIRATORY_TRACT | 0 refills | Status: DC

## 2021-05-25 MED ORDER — METHYLPREDNISOLONE 4 MG PO TBPK: ORAL_TABLET | ORAL | 0 refills | Status: DC

## 2021-05-25 NOTE — Patient Instructions (Signed)
Thank you for visiting Dr. Valeta Harms at [facility]. Today we recommend the following:  Breztri samples with spacer today  Albuterol inhaler DME supply for nebulizer and supplies  Albuterol solution   Return in about 8 weeks (around 07/20/2021) for with APP or Dr. Valeta Harms.    Please do your part to reduce the spread of COVID-19.

## 2021-05-25 NOTE — Progress Notes (Signed)
Synopsis: Referred in April 2021 for abnormal CT chest by [name], DO  Subjective:   PATIENT ID: William Farrell GENDER: male DOB: 06-09-1961, MRN: [id number]  Chief Complaint  Patient presents with   Shortness of Breath    Inhaler causing cough, has stopped using    This is a 60 year old gentleman past medical history of diabetes and high cholesterol.  Patient was enrolled in our lung cancer screening program.  Patient was seen yesterday completed his shared decision-making visit and had his initial lung cancer screening completed.  This revealed a 3.8 cm lung mass with associated mediastinal and hilar adenopathy.  Case was discussed this morning with Eric Form, NP.  Patient was brought into the clinic to be worked down and discuss CT results.  Patient obviously very anxious today.  He denies any symptoms except for cough and occasional sputum production.  Weight has been stable eating okay.  No pain.  Denies hemoptysis.  Of note he is still smoking.  OV 05/25/2021: Today for follow-up.Since last office visit followed up with Patricia Nettle, NP.  Last seen by Dr. Julien Nordmann in clinic on 05/07/2021.  Diagnosis of stage IIIa non-small cell lung cancer, squamous cell carcinoma in April 2021.  Follow-up imaging in June revealed enlarging nodules and masses within the chest.  Concern for progression of disease.  That was completed in March.  Repeat imaging in June appears stable.  Overall having issues with cough and shortness of breath.  He feels labored breathing most of the time.  He is no longer using the Stiolto because it felt like it made him cough in the mornings.  He occasionally uses his wife's albuterol.  He does have side pain on the right which he felt was potentially related to a cracked rib from coughing.   Past Medical History:  Diagnosis Date   Asthma    as a child   Diabetes ([place])    Diverticulitis    GERD (gastroesophageal reflux disease)    High cholesterol     History of kidney stones    Lung cancer (HCC)    Wears glasses      Family History  Problem Relation Age of Onset   High blood pressure Mother    Stroke Mother    Colon cancer Neg Hx    Esophageal cancer Neg Hx    Stomach cancer Neg Hx    Pancreatic cancer Neg Hx      Past Surgical History:  Procedure Laterality Date   BIOPSY  03/25/2021   Procedure: BIOPSY;  Surgeon: Lavena Bullion, DO;  Location: [facility];  Service: Gastroenterology;;   broken bone repair     BRONCHIAL BIOPSY  03/11/2020   Procedure: BRONCHIAL BIOPSIES;  Surgeon: Garner Nash, DO;  Location: [facility];  Service: Pulmonary;;   COLONOSCOPY     around 38[place]    CRYOTHERAPY  03/11/2020   Procedure: CRYOTHERAPY;  Surgeon: Garner Nash, DO;  Location: [facility];  Service: Pulmonary;;   ESOPHAGEAL DILATION  03/25/2021   Procedure: ESOPHAGEAL DILATION;  Surgeon: Lavena Bullion, DO;  Location: [facility];  Service: Gastroenterology;;   ESOPHAGOGASTRODUODENOSCOPY (EGD) WITH PROPOFOL N/A 03/25/2021   Procedure: ESOPHAGOGASTRODUODENOSCOPY (EGD) WITH PROPOFOL;  Surgeon: Lavena Bullion, DO;  Location: [facility];  Service: Gastroenterology;  Laterality: N/A;  dialation of stricture   FINE NEEDLE ASPIRATION  03/11/2020   Procedure: FINE NEEDLE ASPIRATION (FNA) LINEAR;  Surgeon: Garner Nash, DO;  Location: [facility];  Service: Pulmonary;;   IR IMAGING GUIDED PORT INSERTION  10/16/2020   KNEE ARTHROSCOPY WITH ANTERIOR CRUCIATE LIGAMENT (ACL) REPAIR     x 2   VIDEO BRONCHOSCOPY WITH ENDOBRONCHIAL ULTRASOUND N/A 03/11/2020   Procedure: VIDEO BRONCHOSCOPY WITH ENDOBRONCHIAL ULTRASOUND;  Surgeon: Garner Nash, DO;  Location: [facility];  Service: Pulmonary;  Laterality: N/A;    Social History   Socioeconomic History   Marital status: Married    Spouse name: Not on file   Number of children: Not on file   Years of education: Not on file   Highest education level: Not on file   Occupational History   Occupation: Research officer, trade union: [employer]   Tobacco Use   Smoking status: Former    Packs/day: 2.00    Years: 43.00    Pack years: 86.00    Types: Cigarettes    Quit date: 04/15/2020    Years since quitting: 1.1   Smokeless tobacco: Never  Vaping Use   Vaping Use: Never used  Substance and Sexual Activity   Alcohol use: Not Currently   Drug use: Not Currently   Sexual activity: Yes    Partners: Female    Birth control/protection: None  Other Topics Concern   Not on file  Social History Narrative   Not on file   Social Determinants of Health   Financial Resource Strain: Not on file  Food Insecurity: Not on file  Transportation Needs: Not on file  Physical Activity: Not on file  Stress: Not on file  Social Connections: Not on file  Intimate Partner Violence: Not on file     Allergies  Allergen Reactions   Sitagliptin Other (See Comments)    headache     Outpatient Medications Prior to Visit  Medication Sig Dispense Refill   acetaminophen (TYLENOL) 500 MG tablet Take 1,000 mg by mouth every 6 (six) hours as needed for mild pain.     albuterol (VENTOLIN HFA) 108 (90 Base) MCG/ACT inhaler Inhale 1-2 puffs into the lungs every 4 (four) hours as needed for wheezing or shortness of breath. 18 g 99   aspirin EC 81 MG tablet Take 81 mg by mouth at bedtime.      atorvastatin (LIPITOR) 40 MG tablet TAKE 1 TABLET DAILY (Patient taking differently: Take 40 mg by mouth daily.) 90 tablet 3   benzonatate (TESSALON) 200 MG capsule Take 1 capsule (200 mg total) by mouth 3 (three) times daily as needed for cough. 90 capsule 1   calcium carbonate (TUMS - DOSED IN MG ELEMENTAL CALCIUM) 500 MG chewable tablet Chew 2 tablets by mouth 3 (three) times daily as needed for indigestion or heartburn.     dexamethasone (DECADRON) 4 MG tablet TAKE 1 TABLET BY MOUTH TWICE DAILY THE DAY BEFORE, DAY OF AND DAY AFTER CHEMOTHERAPY EVERY 3 WEEKS. (Patient taking  differently: Take 4 mg by mouth See admin instructions. TAKE 1 TABLET BY MOUTH TWICE DAILY THE DAY BEFORE, DAY OF AND DAY AFTER CHEMOTHERAPY EVERY 3 WEEKS.) 40 tablet 0   Ferrous Fumarate-Vitamin C ER 65-25 MG TBCR Take 1 tablet by mouth daily.     glipiZIDE (GLUCOTROL) 5 MG tablet Take 1 tablet (5 mg total) by mouth 2 (two) times daily before a meal. 180 tablet 3   Lancets (ONETOUCH DELICA PLUS [id number]) MISC USE AS DIRECTED UP TO FOUR TIMES A DAY 100 each 13   levothyroxine (SYNTHROID) 75 MCG tablet TAKE 1 TABLET BY MOUTH EVERY DAY BEFORE  BREAKFAST 30 tablet 1   lidocaine-prilocaine (EMLA) cream Apply 1 application topically as needed. (Patient taking differently: Apply 1 application topically as needed (port access).) 30 g 2   loratadine (CLARITIN) 10 MG tablet Take 10 mg by mouth daily as needed for allergies (after chemo shot).     LORazepam (ATIVAN) 0.5 MG tablet Take 1 tablet 30 minutes before your MRI 2 tablet 0   meloxicam (MOBIC) 15 MG tablet TAKE 1 TABLET DAILY (Patient taking differently: Take 15 mg by mouth daily.) 90 tablet 3   metFORMIN (GLUCOPHAGE) 1000 MG tablet TAKE 1 TABLET TWICE A DAY WITH MEALS (Patient taking differently: Take 1,000 mg by mouth 2 (two) times daily with a meal.) 180 tablet 3   ondansetron (ZOFRAN) 8 MG tablet TAKE 1 TABLET BY MOUTH EVERY 8 HOURS AS NEEDED FOR NAUSEA AND VOMITING 9 tablet 2   ONETOUCH ULTRA test strip USE AS INSTRUCTED UP TO FOUR TIMES A DAY 100 strip 13   pantoprazole (PROTONIX) 40 MG tablet Take 1 tablet (40 mg total) by mouth 2 (two) times daily. 180 tablet 3   Semaglutide,0.25 or 0.5MG /DOS, (OZEMPIC, 0.25 OR 0.5 MG/DOSE,) 2 MG/1.5ML SOPN Inject 0.5 mg into the skin once a week. 7.5 mL 1   temazepam (RESTORIL) 15 MG capsule Take 1 capsule (15 mg total) by mouth at bedtime as needed for sleep. 30 capsule 0   Tiotropium Bromide-Olodaterol (STIOLTO RESPIMAT) 2.5-2.5 MCG/ACT AERS Inhale 2 puffs into the lungs daily. 3 each 3   trolamine salicylate  (ASPERCREME) 10 % cream Apply 1 application topically 2 (two) times daily as needed for muscle pain.      No facility-administered medications prior to visit.    Review of Systems  Constitutional:  Negative for chills, fever, malaise/fatigue and weight loss.  HENT:  Negative for hearing loss, sore throat and tinnitus.   Eyes:  Negative for blurred vision and double vision.  Respiratory:  Positive for cough and shortness of breath. Negative for hemoptysis, sputum production, wheezing and stridor.   Cardiovascular:  Negative for chest pain, palpitations, orthopnea, leg swelling and PND.  Gastrointestinal:  Positive for heartburn. Negative for abdominal pain, constipation, diarrhea, nausea and vomiting.  Genitourinary:  Negative for dysuria, hematuria and urgency.  Musculoskeletal:  Negative for joint pain and myalgias.  Skin:  Negative for itching and rash.  Neurological:  Positive for headaches. Negative for dizziness, tingling and weakness.  Endo/Heme/Allergies:  Negative for environmental allergies. Does not bruise/bleed easily.  Psychiatric/Behavioral:  Negative for depression. The patient is not nervous/anxious and does not have insomnia.   All other systems reviewed and are negative.   Objective:  Physical Exam Vitals reviewed.  Constitutional:      General: He is not in acute distress.    Appearance: He is well-developed.  HENT:     Head: Normocephalic and atraumatic.  Eyes:     General: No scleral icterus.    Conjunctiva/sclera: Conjunctivae normal.     Pupils: Pupils are equal, round, and reactive to light.  Neck:     Vascular: No JVD.     Trachea: No tracheal deviation.  Cardiovascular:     Rate and Rhythm: Normal rate and regular rhythm.     Heart sounds: Normal heart sounds. No murmur heard. Pulmonary:     Effort: Pulmonary effort is normal. No tachypnea, accessory muscle usage or respiratory distress.     Breath sounds: No stridor. Decreased breath sounds present.  No wheezing, rhonchi or rales.  Abdominal:  General: Bowel sounds are normal. There is no distension.     Palpations: Abdomen is soft.     Tenderness: There is no abdominal tenderness.  Musculoskeletal:        General: No tenderness.     Cervical back: Neck supple.  Lymphadenopathy:     Cervical: No cervical adenopathy.  Skin:    General: Skin is warm and dry.     Capillary Refill: Capillary refill takes less than 2 seconds.     Findings: No rash.  Neurological:     Mental Status: He is alert and oriented to person, place, and time.  Psychiatric:        Behavior: Behavior normal.     Vitals:   05/25/21 0903  BP: (!) 104/58  Pulse: (!) 118  SpO2: 98%  Weight: 223 lb (101.2 kg)  Height: 6' (1.829 m)    98% on RA BMI Readings from Last 3 Encounters:  05/25/21 30.24 kg/m  05/07/21 31.33 kg/m  04/29/21 31.65 kg/m   Wt Readings from Last 3 Encounters:  05/25/21 223 lb (101.2 kg)  05/07/21 231 lb (104.8 kg)  04/29/21 233 lb 6 oz (105.9 kg)     CBC    Component Value Date/Time   WBC 16.7 (H) 05/21/2021 0800   WBC 9.0 03/11/2020 1045   RBC 3.58 (L) 05/21/2021 0800   HGB 10.7 (L) 05/21/2021 0800   HCT 34.0 (L) 05/21/2021 0800   PLT 144 (L) 05/21/2021 0800   MCV 95.0 05/21/2021 0800   MCH 29.9 05/21/2021 0800   MCHC 31.5 05/21/2021 0800   RDW 18.0 (H) 05/21/2021 0800   LYMPHSABS 1.3 05/21/2021 0800   MONOABS 0.9 05/21/2021 0800   EOSABS 0.1 05/21/2021 0800   BASOSABS 0.1 05/21/2021 0800     Chest Imaging: 03/05/2020: CT chest lung cancer screening 3.8 cm right hilar mass with associated mediastinal adenopathy concerning for primary bronchogenic carcinoma.  Advanced stage.  The patient's images have been independently reviewed by me.     OV 04/20/2021: CT chest: Follow-up imaging posttreatment reveals persistence of the right suprahilar mass hard to tell if there is any local recurrence following radiation treatments.  No other significant adenopathy.   Overall looks stable. The patient's images have been independently reviewed by me.    Pulmonary Functions Testing Results: No flowsheet data found.  FeNO: none   Pathology: none   Echocardiogram: none   Heart Catheterization: none     Assessment & Plan:     ICD-10-CM   1. Malignant neoplasm of right upper lobe of lung (HCC)  C34.11 Budeson-Glycopyrrol-Formoterol (BREZTRI AEROSPHERE) 160-9-4.8 MCG/ACT AERO    Ambulatory Referral for DME    albuterol (VENTOLIN HFA) 108 (90 Base) MCG/ACT inhaler    2. Mediastinal adenopathy  R59.0 Budeson-Glycopyrrol-Formoterol (BREZTRI AEROSPHERE) 160-9-4.8 MCG/ACT AERO    Ambulatory Referral for DME    albuterol (VENTOLIN HFA) 108 (90 Base) MCG/ACT inhaler    3. Cigarette smoker  F17.210 Budeson-Glycopyrrol-Formoterol (BREZTRI AEROSPHERE) 160-9-4.8 MCG/ACT AERO    Ambulatory Referral for DME    albuterol (VENTOLIN HFA) 108 (90 Base) MCG/ACT inhaler    4. Cough  R05.9 Budeson-Glycopyrrol-Formoterol (BREZTRI AEROSPHERE) 160-9-4.8 MCG/ACT AERO    Ambulatory Referral for DME    albuterol (VENTOLIN HFA) 108 (90 Base) MCG/ACT inhaler    5. Dyspnea on exertion  R06.00 Budeson-Glycopyrrol-Formoterol (BREZTRI AEROSPHERE) 160-9-4.8 MCG/ACT AERO    Ambulatory Referral for DME    albuterol (VENTOLIN HFA) 108 (90 Base) MCG/ACT inhaler    6. Centrilobular  emphysema (HCC)  J43.2       Assessment:   61 year old male, new diagnosis of lung cancer stage III.  Underwent chemo plus XRT.  Overall doing well with treatments.  He recently had to have his esophagus stretched due to trouble swallowing also has significant reflux.  I suspect he has COPD.  CT imaging with evidence of emphysema.  He does have ongoing issues with shortness of breath.  Plan: Switch inhaler regimen to [employer]. New prescription for albuterol inhaler New prescription for albuterol nebulizer to be used at home when needed. I think he needs to be on some type of maintenance inhaler due  to his breathlessness during the day. We discussed this in detail today. Continue follow-up with medical oncology.  Would see [place] in 6 to 8 weeks to make sure his breathing has stabilized.    Current Outpatient Medications:    acetaminophen (TYLENOL) 500 MG tablet, Take 1,000 mg by mouth every 6 (six) hours as needed for mild pain., Disp: , Rfl:    albuterol (VENTOLIN HFA) 108 (90 Base) MCG/ACT inhaler, Inhale 1-2 puffs into the lungs every 4 (four) hours as needed for wheezing or shortness of breath., Disp: 18 g, Rfl: 99   aspirin EC 81 MG tablet, Take 81 mg by mouth at bedtime. , Disp: , Rfl:    atorvastatin (LIPITOR) 40 MG tablet, TAKE 1 TABLET DAILY (Patient taking differently: Take 40 mg by mouth daily.), Disp: 90 tablet, Rfl: 3   benzonatate (TESSALON) 200 MG capsule, Take 1 capsule (200 mg total) by mouth 3 (three) times daily as needed for cough., Disp: 90 capsule, Rfl: 1   calcium carbonate (TUMS - DOSED IN MG ELEMENTAL CALCIUM) 500 MG chewable tablet, Chew 2 tablets by mouth 3 (three) times daily as needed for indigestion or heartburn., Disp: , Rfl:    dexamethasone (DECADRON) 4 MG tablet, TAKE 1 TABLET BY MOUTH TWICE DAILY THE DAY BEFORE, DAY OF AND DAY AFTER CHEMOTHERAPY EVERY 3 WEEKS. (Patient taking differently: Take 4 mg by mouth See admin instructions. TAKE 1 TABLET BY MOUTH TWICE DAILY THE DAY BEFORE, DAY OF AND DAY AFTER CHEMOTHERAPY EVERY 3 WEEKS.), Disp: 40 tablet, Rfl: 0   Ferrous Fumarate-Vitamin C ER 65-25 MG TBCR, Take 1 tablet by mouth daily., Disp: , Rfl:    glipiZIDE (GLUCOTROL) 5 MG tablet, Take 1 tablet (5 mg total) by mouth 2 (two) times daily before a meal., Disp: 180 tablet, Rfl: 3   Lancets (ONETOUCH DELICA PLUS [id number]) MISC, USE AS DIRECTED UP TO FOUR TIMES A DAY, Disp: 100 each, Rfl: 13   levothyroxine (SYNTHROID) 75 MCG tablet, TAKE 1 TABLET BY MOUTH EVERY DAY BEFORE BREAKFAST, Disp: 30 tablet, Rfl: 1   lidocaine-prilocaine (EMLA) cream, Apply 1 application  topically as needed. (Patient taking differently: Apply 1 application topically as needed (port access).), Disp: 30 g, Rfl: 2   loratadine (CLARITIN) 10 MG tablet, Take 10 mg by mouth daily as needed for allergies (after chemo shot)., Disp: , Rfl:    LORazepam (ATIVAN) 0.5 MG tablet, Take 1 tablet 30 minutes before your MRI, Disp: 2 tablet, Rfl: 0   meloxicam (MOBIC) 15 MG tablet, TAKE 1 TABLET DAILY (Patient taking differently: Take 15 mg by mouth daily.), Disp: 90 tablet, Rfl: 3   metFORMIN (GLUCOPHAGE) 1000 MG tablet, TAKE 1 TABLET TWICE A DAY WITH MEALS (Patient taking differently: Take 1,000 mg by mouth 2 (two) times daily with a meal.), Disp: 180 tablet, Rfl: 3   ondansetron (  ZOFRAN) 8 MG tablet, TAKE 1 TABLET BY MOUTH EVERY 8 HOURS AS NEEDED FOR NAUSEA AND VOMITING, Disp: 9 tablet, Rfl: 2   ONETOUCH ULTRA test strip, USE AS INSTRUCTED UP TO FOUR TIMES A DAY, Disp: 100 strip, Rfl: 13   pantoprazole (PROTONIX) 40 MG tablet, Take 1 tablet (40 mg total) by mouth 2 (two) times daily., Disp: 180 tablet, Rfl: 3   Semaglutide,0.25 or 0.5MG /DOS, (OZEMPIC, 0.25 OR 0.5 MG/DOSE,) 2 MG/1.5ML SOPN, Inject 0.5 mg into the skin once a week., Disp: 7.5 mL, Rfl: 1   temazepam (RESTORIL) 15 MG capsule, Take 1 capsule (15 mg total) by mouth at bedtime as needed for sleep., Disp: 30 capsule, Rfl: 0   Tiotropium Bromide-Olodaterol (STIOLTO RESPIMAT) 2.5-2.5 MCG/ACT AERS, Inhale 2 puffs into the lungs daily., Disp: 3 each, Rfl: 3   trolamine salicylate (ASPERCREME) 10 % cream, Apply 1 application topically 2 (two) times daily as needed for muscle pain. , Disp: , Rfl:    Garner Nash, DO [facility] 05/25/2021 9:22 AM

## 2021-05-26 ENCOUNTER — Other Ambulatory Visit: Payer: BC Managed Care – PPO

## 2021-05-26 ENCOUNTER — Encounter: Payer: Self-pay | Admitting: Internal Medicine

## 2021-05-26 ENCOUNTER — Ambulatory Visit: Payer: BC Managed Care – PPO

## 2021-05-26 ENCOUNTER — Ambulatory Visit: Payer: BC Managed Care – PPO | Admitting: Internal Medicine

## 2021-05-27 ENCOUNTER — Telehealth: Payer: Self-pay

## 2021-05-27 NOTE — Telephone Encounter (Signed)
Patients wife dropped FMLA paperwork off for you to fill out. Please give her a call once the form is completed. Paperwork is placed in message box. tvt

## 2021-05-28 ENCOUNTER — Inpatient Hospital Stay: Payer: BC Managed Care – PPO

## 2021-05-28 ENCOUNTER — Other Ambulatory Visit: Payer: Self-pay | Admitting: Internal Medicine

## 2021-05-28 ENCOUNTER — Other Ambulatory Visit: Payer: Self-pay

## 2021-05-28 ENCOUNTER — Inpatient Hospital Stay (HOSPITAL_BASED_OUTPATIENT_CLINIC_OR_DEPARTMENT_OTHER): Payer: BC Managed Care – PPO | Admitting: Internal Medicine

## 2021-05-28 VITALS — BP 114/78 | HR 95 | Resp 18

## 2021-05-28 VITALS — BP 132/83 | HR 116 | Temp 97.7°F | Resp 16 | Wt 222.4 lb

## 2021-05-28 DIAGNOSIS — Z5111 Encounter for antineoplastic chemotherapy: Secondary | ICD-10-CM | POA: Diagnosis not present

## 2021-05-28 DIAGNOSIS — E119 Type 2 diabetes mellitus without complications: Secondary | ICD-10-CM | POA: Diagnosis not present

## 2021-05-28 DIAGNOSIS — C3411 Malignant neoplasm of upper lobe, right bronchus or lung: Secondary | ICD-10-CM

## 2021-05-28 DIAGNOSIS — Z95828 Presence of other vascular implants and grafts: Secondary | ICD-10-CM

## 2021-05-28 DIAGNOSIS — Z5112 Encounter for antineoplastic immunotherapy: Secondary | ICD-10-CM | POA: Diagnosis not present

## 2021-05-28 DIAGNOSIS — Z794 Long term (current) use of insulin: Secondary | ICD-10-CM | POA: Diagnosis not present

## 2021-05-28 DIAGNOSIS — Z7982 Long term (current) use of aspirin: Secondary | ICD-10-CM | POA: Diagnosis not present

## 2021-05-28 DIAGNOSIS — C3490 Malignant neoplasm of unspecified part of unspecified bronchus or lung: Secondary | ICD-10-CM

## 2021-05-28 DIAGNOSIS — Z79899 Other long term (current) drug therapy: Secondary | ICD-10-CM | POA: Diagnosis not present

## 2021-05-28 DIAGNOSIS — Z5189 Encounter for other specified aftercare: Secondary | ICD-10-CM | POA: Diagnosis not present

## 2021-05-28 LAB — CMP (CANCER CENTER ONLY)
ALT: 8 U/L (ref 0–44)
AST: 9 U/L — ABNORMAL LOW (ref 15–41)
Albumin: 3 g/dL — ABNORMAL LOW (ref 3.5–5.0)
Alkaline Phosphatase: 114 U/L (ref 38–126)
Anion gap: 13 (ref 5–15)
BUN: 18 mg/dL (ref 6–20)
CO2: 21 mmol/L — ABNORMAL LOW (ref 22–32)
Calcium: 9.3 mg/dL (ref 8.9–10.3)
Chloride: 103 mmol/L (ref 98–111)
Creatinine: 1.51 mg/dL — ABNORMAL HIGH (ref 0.61–1.24)
GFR, Estimated: 53 mL/min — ABNORMAL LOW (ref >60)
Glucose, Bld: 275 mg/dL — ABNORMAL HIGH (ref 70–99)
Potassium: 4.7 mmol/L (ref 3.5–5.1)
Sodium: 137 mmol/L (ref 135–145)
Total Bilirubin: 0.4 mg/dL (ref 0.3–1.2)
Total Protein: 6.9 g/dL (ref 6.5–8.1)

## 2021-05-28 LAB — CBC WITH DIFFERENTIAL (CANCER CENTER ONLY)
Abs Immature Granulocytes: 0.07 10*3/uL (ref 0.00–0.07)
Basophils Absolute: 0 10*3/uL (ref 0.0–0.1)
Basophils Relative: 0 %
Eosinophils Absolute: 0 10*3/uL (ref 0.0–0.5)
Eosinophils Relative: 0 %
HCT: 29.7 % — ABNORMAL LOW (ref 39.0–52.0)
Hemoglobin: 9.5 g/dL — ABNORMAL LOW (ref 13.0–17.0)
Immature Granulocytes: 0 %
Lymphocytes Relative: 3 %
Lymphs Abs: 0.6 10*3/uL — ABNORMAL LOW (ref 0.7–4.0)
MCH: 29.7 pg (ref 26.0–34.0)
MCHC: 32 g/dL (ref 30.0–36.0)
MCV: 92.8 fL (ref 80.0–100.0)
Monocytes Absolute: 1 10*3/uL (ref 0.1–1.0)
Monocytes Relative: 5 %
Neutro Abs: 17.6 10*3/uL — ABNORMAL HIGH (ref 1.7–7.7)
Neutrophils Relative %: 92 %
Platelet Count: 211 10*3/uL (ref 150–400)
RBC: 3.2 MIL/uL — ABNORMAL LOW (ref 4.22–5.81)
RDW: 17.7 % — ABNORMAL HIGH (ref 11.5–15.5)
WBC Count: 19.3 10*3/uL — ABNORMAL HIGH (ref 4.0–10.5)
nRBC: 0 % (ref 0.0–0.2)

## 2021-05-28 LAB — TOTAL PROTEIN, URINE DIPSTICK: Protein, ur: 30 mg/dL — AB

## 2021-05-28 MED ORDER — HEPARIN SOD (PORK) LOCK FLUSH 100 UNIT/ML IV SOLN
500.0000 [IU] | Freq: Once | INTRAVENOUS | Status: AC | PRN
  Administered 2021-05-28: 500 [IU]
  Filled 2021-05-28: qty 5

## 2021-05-28 MED ORDER — ONDANSETRON HCL 4 MG/2ML IJ SOLN
8.0000 mg | Freq: Once | INTRAMUSCULAR | Status: AC
  Administered 2021-05-28: 8 mg via INTRAVENOUS

## 2021-05-28 MED ORDER — DRONABINOL 2.5 MG PO CAPS: 2.5000 mg | ORAL_CAPSULE | Freq: Two times a day (BID) | ORAL | 0 refills | Status: DC

## 2021-05-28 MED ORDER — DIPHENHYDRAMINE HCL 50 MG/ML IJ SOLN
INTRAMUSCULAR | Status: AC
  Filled 2021-05-28: qty 1

## 2021-05-28 MED ORDER — OXYCODONE-ACETAMINOPHEN 5-325 MG PO TABS: 1.0000 | ORAL_TABLET | Freq: Three times a day (TID) | ORAL | 0 refills | Status: DC | PRN

## 2021-05-28 MED ORDER — DIPHENHYDRAMINE HCL 50 MG/ML IJ SOLN
50.0000 mg | Freq: Once | INTRAMUSCULAR | Status: AC
  Administered 2021-05-28: 50 mg via INTRAVENOUS

## 2021-05-28 MED ORDER — ONDANSETRON HCL 4 MG/2ML IJ SOLN
INTRAMUSCULAR | Status: AC
  Filled 2021-05-28: qty 4

## 2021-05-28 MED ORDER — SODIUM CHLORIDE 0.9 % IV SOLN
Freq: Once | INTRAVENOUS | Status: AC
  Filled 2021-05-28: qty 250

## 2021-05-28 MED ORDER — SODIUM CHLORIDE 0.9% FLUSH
10.0000 mL | INTRAVENOUS | Status: DC | PRN
  Administered 2021-05-28: 10 mL
  Filled 2021-05-28: qty 10

## 2021-05-28 MED ORDER — ACETAMINOPHEN 325 MG PO TABS
ORAL_TABLET | ORAL | Status: AC
  Filled 2021-05-28: qty 2

## 2021-05-28 MED ORDER — SODIUM CHLORIDE 0.9 % IV SOLN
10.0000 mg | Freq: Once | INTRAVENOUS | Status: AC
  Administered 2021-05-28: 10 mg via INTRAVENOUS
  Filled 2021-05-28: qty 10

## 2021-05-28 MED ORDER — SODIUM CHLORIDE 0.9 % IV SOLN
75.0000 mg/m2 | Freq: Once | INTRAVENOUS | Status: AC
  Administered 2021-05-28: 180 mg via INTRAVENOUS
  Filled 2021-05-28: qty 18

## 2021-05-28 MED ORDER — SODIUM CHLORIDE 0.9 % IV SOLN
10.0000 mg/kg | Freq: Once | INTRAVENOUS | Status: AC
  Administered 2021-05-28: 1100 mg via INTRAVENOUS
  Filled 2021-05-28: qty 100

## 2021-05-28 MED ORDER — ACETAMINOPHEN 325 MG PO TABS
650.0000 mg | ORAL_TABLET | Freq: Once | ORAL | Status: AC
  Administered 2021-05-28: 650 mg via ORAL

## 2021-05-28 MED ORDER — SODIUM CHLORIDE 0.9% FLUSH
10.0000 mL | Freq: Once | INTRAVENOUS | Status: AC
Start: 2021-05-28 — End: 2021-05-28
  Administered 2021-05-28: 10 mL
  Filled 2021-05-28: qty 10

## 2021-05-28 NOTE — Progress Notes (Signed)
Per Dr. Julien Nordmann, ok for treatment today with elevated heart rate, elevated creatine level, and T4 lab not needed prior to treatment.

## 2021-05-28 NOTE — Patient Instructions (Addendum)
[facility]  Discharge Instructions: Thank you for choosing [facility] to provide your oncology and hematology care.   If you have a lab appointment with the [facility], please go directly to the [facility] and check in at the registration area.   Wear comfortable clothing and clothing appropriate for easy access to any Portacath or PICC line.   We strive to give you quality time with your provider. You may need to reschedule your appointment if you arrive late (15 or more minutes).  Arriving late affects you and other patients whose appointments are after yours.  Also, if you miss three or more appointments without notifying the office, you may be dismissed from the clinic at the provider's discretion.      For prescription refill requests, have your pharmacy contact our office and allow 72 hours for refills to be completed.    Today you received the following chemotherapy and/or immunotherapy agents: Ramucircumab (Cyramza) and Docetaxel (Taxotere).    To help prevent nausea and vomiting after your treatment, we encourage you to take your nausea medication as directed.  BELOW ARE SYMPTOMS THAT SHOULD BE REPORTED IMMEDIATELY: *FEVER GREATER THAN 100.4 F (38 C) OR HIGHER *CHILLS OR SWEATING *NAUSEA AND VOMITING THAT IS NOT CONTROLLED WITH YOUR NAUSEA MEDICATION *UNUSUAL SHORTNESS OF BREATH *UNUSUAL BRUISING OR BLEEDING *URINARY PROBLEMS (pain or burning when urinating, or frequent urination) *BOWEL PROBLEMS (unusual diarrhea, constipation, pain near the anus) TENDERNESS IN MOUTH AND THROAT WITH OR WITHOUT PRESENCE OF ULCERS (sore throat, sores in mouth, or a toothache) UNUSUAL RASH, SWELLING OR PAIN  UNUSUAL VAGINAL DISCHARGE OR ITCHING   Items with * indicate a potential emergency and should be followed up as soon as possible or go to the Emergency Department if any problems should occur.  Please show the CHEMOTHERAPY ALERT CARD or  IMMUNOTHERAPY ALERT CARD at check-in to the Emergency Department and triage nurse.  Should you have questions after your visit or need to cancel or reschedule your appointment, please contact [facility]  Dept: [phone]  and follow the prompts.  Office hours are 8:00 a.m. to 4:30 p.m. Monday - Friday. Please note that voicemails left after 4:00 p.m. may not be returned until the following business day.  We are closed weekends and major holidays. You have access to a nurse at all times for urgent questions. Please call the main number to the clinic Dept: [phone] and follow the prompts.   For any non-urgent questions, you may also contact your provider using MyChart. We now offer e-Visits for anyone 4 and older to request care online for non-urgent symptoms. For details visit [email or web address].   Also download the MyChart app! Go to the app store, search "MyChart", open the app, select [facility], and log in with your MyChart username and password.  Due to Covid, a mask is required upon entering the hospital/clinic. If you do not have a mask, one will be given to you upon arrival. For doctor visits, patients may have 1 support person aged 39 or older with them. For treatment visits, patients cannot have anyone with them due to current Covid guidelines and our immunocompromised population.

## 2021-05-28 NOTE — Progress Notes (Signed)
ua

## 2021-05-28 NOTE — Progress Notes (Signed)
[place] [phone]   [phone]  OFFICE PROGRESS NOTE  [name], DO [address] [place] [address]  DIAGNOSIS: Recurrent/progressive non-small cell lung cancer initially diagnosed as stage IIIA (T2a, N2, M0) non-small cell lung cancer, squamous cell carcinoma diagnosed in April 2021, presented with right upper lobe/suprahilar lung mass in addition to right paratracheal and subcarinal lymphadenopathy.  Molecular Studies by [employer]: No actionable mutations   PRIOR THERAPY:  1) Concurrent chemoradiation with weekly carboplatin for AUC of 2 and paclitaxel 45 NG/M2.  First dose on Mar 31, 2020.  Status post 7 cycles.  Last dose was given on 05/12/2020 with partial response. 2) Consolidation immunotherapy with durvalumab 1500 mg IV every 4 weeks.  First dose May 12, 2020. Status post 3 cycles.  3) Systemic chemotherapy with carboplatin for an AUC of 5, paclitaxel 175 mg/m2, and Keytruda 200 mg IV every 3 weeks. First dose expected on 10/02/20.   Status post 3 cycles.  Last dose was given on November 13, 2020. 4) SBRT to the enlarging right upper lobe lung nodules under the care of Dr. Lisbeth Renshaw.  CURRENT THERAPY:  Second line systemic chemotherapy with docetaxel 75 Mg/M2 and Cyramza 10 mg/KG every 3 weeks with Neulasta support.  First dose December 11, 2020.  Status post 8 cycles.   INTERVAL HISTORY: William Farrell 60 y.o. male returns to the clinic today for follow-up visit accompanied by his wife.  The patient is feeling fine today with no concerning complaints except for fatigue and lack of appetite.  He continues to have mild cough and shortness of breath with exertion.  He was seen recently by Dr. Valeta Harms and started on inhaler with some improvement of his condition.  The patient denied having any fever or chills.  He lost around 10 pounds since his last visit.  He denied having any nausea, vomiting, diarrhea or  constipation.  He has no headache or visual changes.  He continues to tolerate his treatment with docetaxel and Cyramza fairly well.  The patient is here today for evaluation before starting cycle #9.  MEDICAL HISTORY: Past Medical History:  Diagnosis Date   Asthma    as a child   Diabetes ([place])    Diverticulitis    GERD (gastroesophageal reflux disease)    High cholesterol    History of kidney stones    Lung cancer (H[facility])    Wears glasses     ALLERGIES:  is allergic to sitagliptin.  MEDICATIONS:  Current Outpatient Medications  Medication Sig Dispense Refill   acetaminophen (TYLENOL) 500 MG tablet Take 1,000 mg by mouth every 6 (six) hours as needed for mild pain.     albuterol (VENTOLIN HFA) 108 (90 Base) MCG/ACT inhaler Inhale 1-2 puffs into the lungs every 4 (four) hours as needed for wheezing or shortness of breath. 18 g 3   aspirin EC 81 MG tablet Take 81 mg by mouth at bedtime.      atorvastatin (LIPITOR) 40 MG tablet TAKE 1 TABLET DAILY (Patient taking differently: Take 40 mg by mouth daily.) 90 tablet 3   benzonatate (TESSALON) 200 MG capsule Take 1 capsule (200 mg total) by mouth 3 (three) times daily as needed for cough. 90 capsule 1   Budeson-Glycopyrrol-Formoterol (BREZTRI AEROSPHERE) 160-9-4.8 MCG/ACT AERO Inhale 2 puffs into the lungs in the morning and at bedtime. 10.7 g 0   calcium carbonate (TUMS - DOSED IN MG ELEMENTAL CALCIUM) 500 MG  chewable tablet Chew 2 tablets by mouth 3 (three) times daily as needed for indigestion or heartburn.     dexamethasone (DECADRON) 4 MG tablet TAKE 1 TABLET BY MOUTH TWICE DAILY THE DAY BEFORE, DAY OF AND DAY AFTER CHEMOTHERAPY EVERY 3 WEEKS. (Patient taking differently: Take 4 mg by mouth See admin instructions. TAKE 1 TABLET BY MOUTH TWICE DAILY THE DAY BEFORE, DAY OF AND DAY AFTER CHEMOTHERAPY EVERY 3 WEEKS.) 40 tablet 0   Ferrous Fumarate-Vitamin C ER 65-25 MG TBCR Take 1 tablet by mouth daily.     glipiZIDE (GLUCOTROL) 5 MG tablet  Take 1 tablet (5 mg total) by mouth 2 (two) times daily before a meal. 180 tablet 3   Lancets (ONETOUCH DELICA PLUS [id number]) MISC USE AS DIRECTED UP TO FOUR TIMES A DAY 100 each 13   levothyroxine (SYNTHROID) 75 MCG tablet TAKE 1 TABLET BY MOUTH EVERY DAY BEFORE BREAKFAST 30 tablet 1   lidocaine-prilocaine (EMLA) cream Apply 1 application topically as needed. (Patient taking differently: Apply 1 application topically as needed (port access).) 30 g 2   loratadine (CLARITIN) 10 MG tablet Take 10 mg by mouth daily as needed for allergies (after chemo shot).     LORazepam (ATIVAN) 0.5 MG tablet Take 1 tablet 30 minutes before your MRI 2 tablet 0   meloxicam (MOBIC) 15 MG tablet TAKE 1 TABLET DAILY (Patient taking differently: Take 15 mg by mouth daily.) 90 tablet 3   metFORMIN (GLUCOPHAGE) 1000 MG tablet TAKE 1 TABLET TWICE A DAY WITH MEALS (Patient taking differently: Take 1,000 mg by mouth 2 (two) times daily with a meal.) 180 tablet 3   methylPREDNISolone (MEDROL DOSEPAK) 4 MG TBPK tablet Use as instructed 21 tablet 0   ondansetron (ZOFRAN) 8 MG tablet TAKE 1 TABLET BY MOUTH EVERY 8 HOURS AS NEEDED FOR NAUSEA AND VOMITING 9 tablet 2   ONETOUCH ULTRA test strip USE AS INSTRUCTED UP TO FOUR TIMES A DAY 100 strip 13   pantoprazole (PROTONIX) 40 MG tablet Take 1 tablet (40 mg total) by mouth 2 (two) times daily. 180 tablet 3   Semaglutide,0.25 or 0.5MG /DOS, (OZEMPIC, 0.25 OR 0.5 MG/DOSE,) 2 MG/1.5ML SOPN Inject 0.5 mg into the skin once a week. 7.5 mL 1   temazepam (RESTORIL) 15 MG capsule Take 1 capsule (15 mg total) by mouth at bedtime as needed for sleep. 30 capsule 0   Tiotropium Bromide-Olodaterol (STIOLTO RESPIMAT) 2.5-2.5 MCG/ACT AERS Inhale 2 puffs into the lungs daily. 3 each 3   trolamine salicylate (ASPERCREME) 10 % cream Apply 1 application topically 2 (two) times daily as needed for muscle pain.      No current facility-administered medications for this visit.    SURGICAL HISTORY:   Past Surgical History:  Procedure Laterality Date   BIOPSY  03/25/2021   Procedure: BIOPSY;  Surgeon: Lavena Bullion, DO;  Location: [facility];  Service: Gastroenterology;;   broken bone repair     BRONCHIAL BIOPSY  03/11/2020   Procedure: BRONCHIAL BIOPSIES;  Surgeon: Garner Nash, DO;  Location: [facility];  Service: Pulmonary;;   COLONOSCOPY     around [place]    CRYOTHERAPY  03/11/2020   Procedure: CRYOTHERAPY;  Surgeon: Garner Nash, DO;  Location: [facility];  Service: Pulmonary;;   ESOPHAGEAL DILATION  03/25/2021   Procedure: ESOPHAGEAL DILATION;  Surgeon: Lavena Bullion, DO;  Location: [facility];  Service: Gastroenterology;;   ESOPHAGOGASTRODUODENOSCOPY (EGD) WITH PROPOFOL N/A 03/25/2021   Procedure: ESOPHAGOGASTRODUODENOSCOPY (EGD) WITH  PROPOFOL;  Surgeon: Lavena Bullion, DO;  Location: [facility];  Service: Gastroenterology;  Laterality: N/A;  dialation of stricture   FINE NEEDLE ASPIRATION  03/11/2020   Procedure: FINE NEEDLE ASPIRATION (FNA) LINEAR;  Surgeon: Garner Nash, DO;  Location: [facility];  Service: Pulmonary;;   IR IMAGING GUIDED PORT INSERTION  10/16/2020   KNEE ARTHROSCOPY WITH ANTERIOR CRUCIATE LIGAMENT (ACL) REPAIR     x 2   VIDEO BRONCHOSCOPY WITH ENDOBRONCHIAL ULTRASOUND N/A 03/11/2020   Procedure: VIDEO BRONCHOSCOPY WITH ENDOBRONCHIAL ULTRASOUND;  Surgeon: Garner Nash, DO;  Location: [facility];  Service: Pulmonary;  Laterality: N/A;    REVIEW OF SYSTEMS:  Constitutional: positive for fatigue and weight loss Eyes: negative Ears, nose, mouth, throat, and face: negative Respiratory: positive for cough and dyspnea on exertion Cardiovascular: negative Gastrointestinal: negative Genitourinary:negative Integument/breast: negative for rash Hematologic/lymphatic: negative Musculoskeletal:negative Neurological: negative Behavioral/Psych: negative Endocrine: negative Allergic/Immunologic: negative   PHYSICAL  EXAMINATION: General appearance: alert, cooperative, fatigued, and no distress Head: Normocephalic, without obvious abnormality, atraumatic Neck: no adenopathy, no JVD, supple, symmetrical, trachea midline, and thyroid not enlarged, symmetric, no tenderness/mass/nodules Lymph nodes: Cervical, supraclavicular, and axillary nodes normal. Resp: clear to auscultation bilaterally Back: symmetric, no curvature. ROM normal. No CVA tenderness. Cardio: regular rate and rhythm, S1, S2 normal, no murmur, click, rub or gallop GI: soft, non-tender; bowel sounds normal; no masses,  no organomegaly Extremities: extremities normal, atraumatic, no cyanosis or edema Neurologic: Alert and oriented X 3, normal strength and tone. Normal symmetric reflexes. Normal coordination and gait  ECOG PERFORMANCE STATUS: 1 - Symptomatic but completely ambulatory  Blood pressure 132/83, pulse (!) 116, temperature 97.7 F (36.5 C), temperature source Oral, resp. rate 16, weight 222 lb 6.4 oz (100.9 kg), SpO2 99 %.  LABORATORY DATA: Lab Results  Component Value Date   WBC 16.7 (H) 05/21/2021   HGB 10.7 (L) 05/21/2021   HCT 34.0 (L) 05/21/2021   MCV 95.0 05/21/2021   PLT 144 (L) 05/21/2021      Chemistry      Component Value Date/Time   NA 136 05/21/2021 0800   K 5.0 05/21/2021 0800   CL 100 05/21/2021 0800   CO2 27 05/21/2021 0800   BUN 24 (H) 05/21/2021 0800   CREATININE 1.45 (H) 05/21/2021 0800   CREATININE 1.10 01/14/2020 0859      Component Value Date/Time   CALCIUM 9.4 05/21/2021 0800   ALKPHOS 128 (H) 05/21/2021 0800   AST 13 (L) 05/21/2021 0800   ALT 10 05/21/2021 0800   BILITOT 0.4 05/21/2021 0800       RADIOGRAPHIC STUDIES: MR Brain W Wo Contrast  Result Date: 05/05/2021 CLINICAL DATA:  New headaches.  History of metastatic lung cancer. EXAM: MRI HEAD WITHOUT AND WITH CONTRAST TECHNIQUE: Multiplanar, multiecho pulse sequences of the brain and surrounding structures were obtained without and  with intravenous contrast. CONTRAST:  79mL GADAVIST GADOBUTROL 1 MMOL/ML IV SOLN COMPARISON:  03/22/2020 FINDINGS: Brain: There is no evidence of an acute infarct, intracranial hemorrhage, mass, midline shift, or extra-axial fluid collection. The ventricles and sulci are normal. No significant white matter disease is seen for age. No abnormal enhancement is identified. Vascular: Major intracranial vascular flow voids are preserved. Unchanged 8 mm focus of mixed T1 and T2 signal intensity adjacent to the distal aspect of the left petrous ICA, indeterminate for a potentially partially thrombosed aneurysm. Skull and upper cervical spine: Unremarkable bone marrow signal. Sinuses/Orbits: Unremarkable orbits. Minimal mucosal thickening in the paranasal sinuses. Mildly  decreased size of a small left mastoid effusion. Other: None. IMPRESSION: 1. Unremarkable appearance of the brain for age. No evidence of intracranial metastases. 2. 8 mm focus of signal abnormality adjacent to the distal left petrous ICA. Head CTA is recommended to evaluate for an aneurysm. Electronically Signed   By: Logan Bores M.D.   On: 05/05/2021 08:02    ASSESSMENT AND PLAN: This is a 60 years old white male was recently diagnosed with stage IIIa non-small cell lung cancer, squamous cell carcinoma in April 2021 presented with right upper lobe/suprahilar lung mass in addition to right paratracheal and subcarinal lymphadenopathy. The patient completed a course of concurrent chemoradiation with weekly carboplatin for AUC of 2 and paclitaxel 45 MG/M2.  He is status post 7 cycles.  He tolerated the previous course of his treatment well except for mild dysphagia and odynophagia. He underwent consolidation treatment with immunotherapy with Imfinzi 1500 mg IV every 4 weeks.  Status post 3 cycles.  The patient tolerated the treatment well but unfortunately he has evidence for disease progression after cycle #3. He is started first-line treatment with  chemotherapy with carboplatin for AUC of 5, paclitaxel 175 mg/M2 and Keytruda 200 mg IV every 3 weeks.  Status post 3 cycles.  He has been tolerating this treatment well with no concerning adverse effects except for mild fatigue. The patient had repeat CT scan of the chest, abdomen pelvis performed recently.  I personally and independently reviewed the scan images and discussed the result and showed the images to the patient today. Unfortunately his scan showed enlarging nodules and masses in the chest with enlarging lymph nodes.  This is definitely highly suspicious for disease progression but to the progression on immunotherapy could not be completely excluded. He is currently undergoing palliative systemic chemotherapy with second line docetaxel 75 Mg/M2 and Cyramza 10 mg/KG every 3 weeks with Neulasta support.  Status post 8 cycles.   He also underwent palliative radiotherapy to the enlarging right upper lobe lung mass under the care of Dr. Lisbeth Renshaw. The patient continues to tolerate his systemic chemotherapy fairly well with no concerning adverse effect except for fatigue and lack of appetite. I recommended for him to proceed with cycle #9 today as planned. I will see him back for follow-up visit in 3 weeks for evaluation with repeat CT scan of the chest, abdomen pelvis for restaging of his disease. For the lack of appetite, I will start him on Marinol 2.5 mg p.o. twice daily. For pain management I will give him refill of Percocet. For the diabetes mellitus he is currently on Ozempic and will continue his routine follow-up visit by his primary care physician. For the insomnia he is currently on Restoril 15 mg p.o. nightly. The patient was advised to call immediately if he has any other concerning symptoms in the interval.  The patient voices understanding of current disease status and treatment options and is in agreement with the current care plan. All questions were answered. The patient knows to  call the clinic with any problems, questions or concerns. We can certainly see the patient much sooner if necessary.  Disclaimer: This note was dictated with voice recognition software. Similar sounding words can inadvertently be transcribed and may not be corrected upon review.

## 2021-05-28 NOTE — Progress Notes (Signed)
Per Dr Julien Nordmann it is okay to treat pt today with Taxotere and Cyramza and creatinine of 1.51 and heart rate of 118.

## 2021-05-29 ENCOUNTER — Telehealth: Payer: Self-pay

## 2021-05-29 ENCOUNTER — Inpatient Hospital Stay: Payer: BC Managed Care – PPO

## 2021-05-29 ENCOUNTER — Telehealth: Payer: Self-pay | Admitting: Osteopathic Medicine

## 2021-05-29 ENCOUNTER — Encounter: Payer: Self-pay | Admitting: Osteopathic Medicine

## 2021-05-29 VITALS — BP 120/61 | HR 115 | Temp 97.6°F | Resp 18

## 2021-05-29 DIAGNOSIS — C3411 Malignant neoplasm of upper lobe, right bronchus or lung: Secondary | ICD-10-CM | POA: Diagnosis not present

## 2021-05-29 DIAGNOSIS — Z5189 Encounter for other specified aftercare: Secondary | ICD-10-CM | POA: Diagnosis not present

## 2021-05-29 DIAGNOSIS — Z79899 Other long term (current) drug therapy: Secondary | ICD-10-CM | POA: Diagnosis not present

## 2021-05-29 DIAGNOSIS — E119 Type 2 diabetes mellitus without complications: Secondary | ICD-10-CM | POA: Diagnosis not present

## 2021-05-29 DIAGNOSIS — E039 Hypothyroidism, unspecified: Secondary | ICD-10-CM

## 2021-05-29 DIAGNOSIS — Z794 Long term (current) use of insulin: Secondary | ICD-10-CM | POA: Diagnosis not present

## 2021-05-29 DIAGNOSIS — Z5111 Encounter for antineoplastic chemotherapy: Secondary | ICD-10-CM | POA: Diagnosis not present

## 2021-05-29 DIAGNOSIS — Z7982 Long term (current) use of aspirin: Secondary | ICD-10-CM | POA: Diagnosis not present

## 2021-05-29 DIAGNOSIS — Z5112 Encounter for antineoplastic immunotherapy: Secondary | ICD-10-CM | POA: Diagnosis not present

## 2021-05-29 MED ORDER — PEGFILGRASTIM-CBQV 6 MG/0.6ML ~~LOC~~ SOSY
6.0000 mg | PREFILLED_SYRINGE | Freq: Once | SUBCUTANEOUS | Status: AC
  Administered 2021-05-29: 6 mg via SUBCUTANEOUS

## 2021-05-29 MED ORDER — LEVOTHYROXINE SODIUM 75 MCG PO TABS: ORAL_TABLET | ORAL | 1 refills | Status: DC

## 2021-05-29 NOTE — Telephone Encounter (Signed)
Fax refill request from [facility] for Levothyroxine 42mcg QD.  Verbal auth given by Aurther Loft.

## 2021-05-29 NOTE — Telephone Encounter (Signed)
Patient and I did not discuss filling out any forms.  He is under treatment for cancer, he is also seeing GI.  I sent him a MyChart message to clarify but I think he is probably going to have to schedule an appointment sometime next week to get these filled out in detail.  Probably getting something scheduled with one of the NP's is fine.  Routing to [employer] as FYI.

## 2021-05-29 NOTE — Patient Instructions (Signed)

## 2021-05-29 NOTE — Telephone Encounter (Signed)
Pt's wife dropped off forms for FMLA to be filled out on 05/29/2021

## 2021-06-01 ENCOUNTER — Encounter: Payer: Self-pay | Admitting: Internal Medicine

## 2021-06-02 ENCOUNTER — Other Ambulatory Visit: Payer: Self-pay | Admitting: Internal Medicine

## 2021-06-02 MED ORDER — DRONABINOL 2.5 MG PO CAPS: 2.5000 mg | ORAL_CAPSULE | Freq: Two times a day (BID) | ORAL | 0 refills | Status: DC

## 2021-06-04 ENCOUNTER — Other Ambulatory Visit: Payer: Self-pay | Admitting: *Deleted

## 2021-06-04 ENCOUNTER — Inpatient Hospital Stay: Payer: BC Managed Care – PPO

## 2021-06-04 ENCOUNTER — Other Ambulatory Visit: Payer: Self-pay

## 2021-06-04 VITALS — BP 108/72 | HR 89 | Temp 97.5°F | Resp 20

## 2021-06-04 DIAGNOSIS — Z5112 Encounter for antineoplastic immunotherapy: Secondary | ICD-10-CM | POA: Diagnosis not present

## 2021-06-04 DIAGNOSIS — C3490 Malignant neoplasm of unspecified part of unspecified bronchus or lung: Secondary | ICD-10-CM

## 2021-06-04 DIAGNOSIS — E119 Type 2 diabetes mellitus without complications: Secondary | ICD-10-CM | POA: Diagnosis not present

## 2021-06-04 DIAGNOSIS — Z794 Long term (current) use of insulin: Secondary | ICD-10-CM | POA: Diagnosis not present

## 2021-06-04 DIAGNOSIS — C3411 Malignant neoplasm of upper lobe, right bronchus or lung: Secondary | ICD-10-CM

## 2021-06-04 DIAGNOSIS — E039 Hypothyroidism, unspecified: Secondary | ICD-10-CM

## 2021-06-04 DIAGNOSIS — Z5189 Encounter for other specified aftercare: Secondary | ICD-10-CM | POA: Diagnosis not present

## 2021-06-04 DIAGNOSIS — Z95828 Presence of other vascular implants and grafts: Secondary | ICD-10-CM

## 2021-06-04 DIAGNOSIS — Z5111 Encounter for antineoplastic chemotherapy: Secondary | ICD-10-CM | POA: Diagnosis not present

## 2021-06-04 DIAGNOSIS — Z7982 Long term (current) use of aspirin: Secondary | ICD-10-CM | POA: Diagnosis not present

## 2021-06-04 DIAGNOSIS — Z79899 Other long term (current) drug therapy: Secondary | ICD-10-CM | POA: Diagnosis not present

## 2021-06-04 LAB — CBC WITH DIFFERENTIAL (CANCER CENTER ONLY)
Abs Immature Granulocytes: 0.05 10*3/uL (ref 0.00–0.07)
Basophils Absolute: 0.1 10*3/uL (ref 0.0–0.1)
Basophils Relative: 1 %
Eosinophils Absolute: 0.1 10*3/uL (ref 0.0–0.5)
Eosinophils Relative: 1 %
HCT: 29.1 % — ABNORMAL LOW (ref 39.0–52.0)
Hemoglobin: 9.5 g/dL — ABNORMAL LOW (ref 13.0–17.0)
Immature Granulocytes: 1 %
Lymphocytes Relative: 9 %
Lymphs Abs: 0.6 10*3/uL — ABNORMAL LOW (ref 0.7–4.0)
MCH: 29.9 pg (ref 26.0–34.0)
MCHC: 32.6 g/dL (ref 30.0–36.0)
MCV: 91.5 fL (ref 80.0–100.0)
Monocytes Absolute: 0.9 10*3/uL (ref 0.1–1.0)
Monocytes Relative: 13 %
Neutro Abs: 5 10*3/uL (ref 1.7–7.7)
Neutrophils Relative %: 75 %
Platelet Count: 147 10*3/uL — ABNORMAL LOW (ref 150–400)
RBC: 3.18 MIL/uL — ABNORMAL LOW (ref 4.22–5.81)
RDW: 17.6 % — ABNORMAL HIGH (ref 11.5–15.5)
WBC Count: 6.6 10*3/uL (ref 4.0–10.5)
nRBC: 0 % (ref 0.0–0.2)

## 2021-06-04 LAB — CMP (CANCER CENTER ONLY)
ALT: 8 U/L (ref 0–44)
AST: 12 U/L — ABNORMAL LOW (ref 15–41)
Albumin: 3.5 g/dL (ref 3.5–5.0)
Alkaline Phosphatase: 119 U/L (ref 38–126)
Anion gap: 10 (ref 5–15)
BUN: 14 mg/dL (ref 6–20)
CO2: 23 mmol/L (ref 22–32)
Calcium: 9.3 mg/dL (ref 8.9–10.3)
Chloride: 102 mmol/L (ref 98–111)
Creatinine: 1.29 mg/dL — ABNORMAL HIGH (ref 0.61–1.24)
GFR, Estimated: >60 mL/min (ref >60)
Glucose, Bld: 153 mg/dL — ABNORMAL HIGH (ref 70–99)
Potassium: 4.2 mmol/L (ref 3.5–5.1)
Sodium: 135 mmol/L (ref 135–145)
Total Bilirubin: 0.5 mg/dL (ref 0.3–1.2)
Total Protein: 6.5 g/dL (ref 6.5–8.1)

## 2021-06-04 LAB — SAMPLE TO BLOOD BANK

## 2021-06-04 LAB — TOTAL PROTEIN, URINE DIPSTICK: Protein, ur: 30 mg/dL — AB

## 2021-06-04 MED ORDER — SODIUM CHLORIDE 0.9% FLUSH
10.0000 mL | Freq: Once | INTRAVENOUS | Status: AC
Start: 2021-06-04 — End: 2021-06-04
  Administered 2021-06-04: 10 mL
  Filled 2021-06-04: qty 10

## 2021-06-04 MED ORDER — HEPARIN SOD (PORK) LOCK FLUSH 100 UNIT/ML IV SOLN
500.0000 [IU] | Freq: Once | INTRAVENOUS | Status: AC
  Administered 2021-06-04: 500 [IU]
  Filled 2021-06-04: qty 5

## 2021-06-04 MED ORDER — SODIUM CHLORIDE 0.9 % IV SOLN
INTRAVENOUS | Status: DC
  Filled 2021-06-04 (×2): qty 250

## 2021-06-04 NOTE — Progress Notes (Signed)
At [place] pt.  was in the lab when the told the technician that he is not feeling well (see flow sheet for VS). He also stated that he "could not eat anything since last Thursday". Will contact Dr. Julien Nordmann for further instructions.

## 2021-06-04 NOTE — Patient Instructions (Signed)

## 2021-06-04 NOTE — Progress Notes (Signed)
Pt here for lab/port flush, pale, weak, dizzy and very fatigued. MD notified.  1123 pt requesting to be discharged, rcvd 500cc IVF. BP 108/72. Pt drank 1 protein drink. Pt taken to lobby via wheelchair due to fatigue. Encouraged small frequent snacks and hydration. Pt and wife verbalized understanding.  MD notified.

## 2021-06-05 ENCOUNTER — Other Ambulatory Visit: Payer: Self-pay

## 2021-06-05 DIAGNOSIS — C3411 Malignant neoplasm of upper lobe, right bronchus or lung: Secondary | ICD-10-CM | POA: Diagnosis not present

## 2021-06-08 ENCOUNTER — Ambulatory Visit (INDEPENDENT_AMBULATORY_CARE_PROVIDER_SITE_OTHER): Payer: BC Managed Care – PPO

## 2021-06-08 ENCOUNTER — Other Ambulatory Visit: Payer: Self-pay

## 2021-06-08 DIAGNOSIS — I700 Atherosclerosis of aorta: Secondary | ICD-10-CM | POA: Diagnosis not present

## 2021-06-08 DIAGNOSIS — C3490 Malignant neoplasm of unspecified part of unspecified bronchus or lung: Secondary | ICD-10-CM

## 2021-06-08 DIAGNOSIS — J90 Pleural effusion, not elsewhere classified: Secondary | ICD-10-CM | POA: Diagnosis not present

## 2021-06-08 DIAGNOSIS — R911 Solitary pulmonary nodule: Secondary | ICD-10-CM | POA: Diagnosis not present

## 2021-06-08 DIAGNOSIS — R918 Other nonspecific abnormal finding of lung field: Secondary | ICD-10-CM | POA: Diagnosis not present

## 2021-06-08 MED ORDER — IOHEXOL 300 MG/ML  SOLN
100.0000 mL | Freq: Once | INTRAMUSCULAR | Status: AC | PRN
  Administered 2021-06-08: 100 mL via INTRAVENOUS

## 2021-06-09 ENCOUNTER — Other Ambulatory Visit: Payer: Self-pay | Admitting: Osteopathic Medicine

## 2021-06-10 ENCOUNTER — Other Ambulatory Visit: Payer: Self-pay

## 2021-06-10 ENCOUNTER — Encounter: Payer: Self-pay | Admitting: Osteopathic Medicine

## 2021-06-10 ENCOUNTER — Other Ambulatory Visit: Payer: Self-pay | Admitting: Internal Medicine

## 2021-06-10 ENCOUNTER — Ambulatory Visit (INDEPENDENT_AMBULATORY_CARE_PROVIDER_SITE_OTHER): Payer: BC Managed Care – PPO | Admitting: Osteopathic Medicine

## 2021-06-10 ENCOUNTER — Inpatient Hospital Stay: Payer: BC Managed Care – PPO

## 2021-06-10 VITALS — BP 93/67 | HR 114 | Temp 97.9°F | Wt 219.1 lb

## 2021-06-10 DIAGNOSIS — Z5112 Encounter for antineoplastic immunotherapy: Secondary | ICD-10-CM | POA: Diagnosis not present

## 2021-06-10 DIAGNOSIS — Z5189 Encounter for other specified aftercare: Secondary | ICD-10-CM | POA: Diagnosis not present

## 2021-06-10 DIAGNOSIS — Z5111 Encounter for antineoplastic chemotherapy: Secondary | ICD-10-CM | POA: Diagnosis not present

## 2021-06-10 DIAGNOSIS — C3411 Malignant neoplasm of upper lobe, right bronchus or lung: Secondary | ICD-10-CM

## 2021-06-10 DIAGNOSIS — Z79899 Other long term (current) drug therapy: Secondary | ICD-10-CM | POA: Diagnosis not present

## 2021-06-10 DIAGNOSIS — Z7982 Long term (current) use of aspirin: Secondary | ICD-10-CM | POA: Diagnosis not present

## 2021-06-10 DIAGNOSIS — E119 Type 2 diabetes mellitus without complications: Secondary | ICD-10-CM | POA: Diagnosis not present

## 2021-06-10 DIAGNOSIS — Z794 Long term (current) use of insulin: Secondary | ICD-10-CM | POA: Diagnosis not present

## 2021-06-10 LAB — CMP (CANCER CENTER ONLY)
ALT: 11 U/L (ref 0–44)
AST: 15 U/L (ref 15–41)
Albumin: 3.8 g/dL (ref 3.5–5.0)
Alkaline Phosphatase: 156 U/L — ABNORMAL HIGH (ref 38–126)
Anion gap: 11 (ref 5–15)
BUN: 12 mg/dL (ref 6–20)
CO2: 26 mmol/L (ref 22–32)
Calcium: 9.8 mg/dL (ref 8.9–10.3)
Chloride: 101 mmol/L (ref 98–111)
Creatinine: 1.76 mg/dL — ABNORMAL HIGH (ref 0.61–1.24)
GFR, Estimated: 44 mL/min — ABNORMAL LOW (ref >60)
Glucose, Bld: 124 mg/dL — ABNORMAL HIGH (ref 70–99)
Potassium: 5.1 mmol/L (ref 3.5–5.1)
Sodium: 138 mmol/L (ref 135–145)
Total Bilirubin: 0.3 mg/dL (ref 0.3–1.2)
Total Protein: 7.1 g/dL (ref 6.5–8.1)

## 2021-06-10 LAB — CBC WITH DIFFERENTIAL (CANCER CENTER ONLY)
Abs Immature Granulocytes: 0.2 10*3/uL — ABNORMAL HIGH (ref 0.00–0.07)
Basophils Absolute: 0.1 10*3/uL (ref 0.0–0.1)
Basophils Relative: 0 %
Eosinophils Absolute: 0.1 10*3/uL (ref 0.0–0.5)
Eosinophils Relative: 1 %
HCT: 33 % — ABNORMAL LOW (ref 39.0–52.0)
Hemoglobin: 10.4 g/dL — ABNORMAL LOW (ref 13.0–17.0)
Immature Granulocytes: 1 %
Lymphocytes Relative: 6 %
Lymphs Abs: 1.2 10*3/uL (ref 0.7–4.0)
MCH: 29.5 pg (ref 26.0–34.0)
MCHC: 31.5 g/dL (ref 30.0–36.0)
MCV: 93.8 fL (ref 80.0–100.0)
Monocytes Absolute: 1.3 10*3/uL — ABNORMAL HIGH (ref 0.1–1.0)
Monocytes Relative: 6 %
Neutro Abs: 17.9 10*3/uL — ABNORMAL HIGH (ref 1.7–7.7)
Neutrophils Relative %: 86 %
Platelet Count: 193 10*3/uL (ref 150–400)
RBC: 3.52 MIL/uL — ABNORMAL LOW (ref 4.22–5.81)
RDW: 18.6 % — ABNORMAL HIGH (ref 11.5–15.5)
WBC Count: 20.7 10*3/uL — ABNORMAL HIGH (ref 4.0–10.5)
nRBC: 0.1 % (ref 0.0–0.2)

## 2021-06-10 LAB — POCT GLYCOSYLATED HEMOGLOBIN (HGB A1C): Hemoglobin A1C: 6.8 % — AB (ref 4.0–5.6)

## 2021-06-10 NOTE — Progress Notes (Signed)
William Farrell is a 60 y.o. male who presents to  [place] at [facility]  today, 06/10/21, seeking care for the following:  DM2 follow-up, concern for hyperglycemia w/ cancer treatments (steroids)    DM2 follow-up -there have been concerns about hyperglycemia likely as a side effect of cancer treatments including steroids.  Patient has also noted some weight gain, probably some room for improvement in terms of diet/activity. Pt has declined insulin coverage. He is maxed on glipizide and metformin, visit 11/2020 we added Ozempic. Plan from Onc is indefinite chemo, so steroid treatment isn't going away anytime soon A1C 15 mos ago 7.1 --> 9 mos ago was 8.7 --> 6 mos ago was 9.3 --> 3 mos ago (last visit) was 7.5 --> today 06/10/21 is 6.8. Reports overall doing well, still in treatment for lung cancer. Greatest complaint at this time is food doesn't taste or feel right in his mouth, he's having difficulty eating though reports decent appetite     Results for orders placed or performed in visit on 06/10/21 (from the past 24 hour(s))  POCT HgB A1C     Status: Abnormal   Collection Time: 06/10/21  8:32 AM  Result Value Ref Range   Hemoglobin A1C 6.8 (A) 4.0 - 5.6 %   HbA1c POC (<> result, manual entry)     HbA1c, POC (prediabetic range)     HbA1c, POC (controlled diabetic range)         ASSESSMENT & PLAN with other pertinent findings:  The encounter diagnosis was Type 2 diabetes mellitus without complication, without long-term current use of insulin ([place]).   A1C in acceptable range I'm not concerned w/ his diet - see above, eat whatever he wants is fine, but I did recommend following w/ cancer center nutritionist resources   There are no Patient Instructions on file for this visit.  Orders Placed This Encounter  Procedures   POCT HgB A1C    No orders of the defined types were placed in this encounter.    See below for relevant  physical exam findings  See below for recent lab and imaging results reviewed  Medications, allergies, PMH, PSH, SocH, FamH reviewed below    Follow-up instructions: Return for MONITOR A1C .                                        Exam:  BP 93/67 (BP Location: Left Arm, Patient Position: Sitting, Cuff Size: Large)   Pulse (!) 114   Temp 97.9 F (36.6 C) (Oral)   Wt 219 lb 1.3 oz (99.4 kg)   BMI 29.71 kg/m  Constitutional: VS see above. General Appearance: alert, well-developed, well-nourished, NAD Neck: No masses, trachea midline.  Respiratory: Normal respiratory effort.  Cardiovascular: S1/S2 normal, no murmur, no rub/gallop auscultated. RRR.  Musculoskeletal: Gait normal. Symmetric and independent movement of all extremities Neurological: Normal balance/coordination. No tremor. Skin: warm, dry, intact.  Psychiatric: Normal judgment/insight. Normal mood and affect. Oriented x3.   Current Meds  Medication Sig   acetaminophen (TYLENOL) 500 MG tablet Take 1,000 mg by mouth every 6 (six) hours as needed for mild pain.   albuterol (VENTOLIN HFA) 108 (90 Base) MCG/ACT inhaler Inhale 1-2 puffs into the lungs every 4 (four) hours as needed for wheezing or shortness of breath.   aspirin EC 81 MG tablet Take 81 mg by mouth at  bedtime.    atorvastatin (LIPITOR) 40 MG tablet TAKE 1 TABLET DAILY (Patient taking differently: Take 40 mg by mouth at bedtime.)   benzonatate (TESSALON) 200 MG capsule Take 1 capsule (200 mg total) by mouth 3 (three) times daily as needed for cough. (Patient taking differently: Take 200 mg by mouth 3 (three) times daily.)   Budeson-Glycopyrrol-Formoterol (BREZTRI AEROSPHERE) 160-9-4.8 MCG/ACT AERO Inhale 2 puffs into the lungs in the morning and at bedtime.   dexamethasone (DECADRON) 4 MG tablet TAKE 1 TABLET BY MOUTH TWICE DAILY THE DAY BEFORE, DAY OF AND DAY AFTER CHEMOTHERAPY EVERY 3 WEEKS. (Patient taking differently: Take 4 mg  by mouth See admin instructions. TAKE 4 mg TABLET BY MOUTH TWICE DAILY THE DAY BEFORE, DAY OF, AND DAY AFTER, CHEMOTHERAPY EVERY 3 WEEKS.)   dronabinol (MARINOL) 2.5 MG capsule Take 1 capsule (2.5 mg total) by mouth 2 (two) times daily before lunch and supper.   Ferrous Fumarate-Vitamin C ER 65-25 MG TBCR Take 65 mg by mouth daily.   glipiZIDE (GLUCOTROL) 5 MG tablet Take 1 tablet (5 mg total) by mouth 2 (two) times daily before a meal.   Lancets (ONETOUCH DELICA PLUS JJHERD40C) MISC USE AS DIRECTED UP TO FOUR TIMES A DAY   levothyroxine (SYNTHROID) 75 MCG tablet TAKE 1 TABLET BY MOUTH EVERY DAY BEFORE BREAKFAST (Patient taking differently: Take 75 mcg by mouth daily before breakfast.)   lidocaine-prilocaine (EMLA) cream Apply 1 application topically as needed. (Patient taking differently: Apply 1 application topically as needed (port access).)   loratadine (CLARITIN) 10 MG tablet Take 10 mg by mouth daily as needed for allergies (after chemo shot Fri, sat. sun.).   meloxicam (MOBIC) 15 MG tablet TAKE 1 TABLET DAILY (Patient taking differently: Take 15 mg by mouth daily.)   metFORMIN (GLUCOPHAGE) 1000 MG tablet TAKE 1 TABLET TWICE A DAY WITH MEALS   ondansetron (ZOFRAN) 8 MG tablet TAKE 1 TABLET BY MOUTH EVERY 8 HOURS AS NEEDED FOR NAUSEA AND VOMITING (Patient taking differently: Take 8 mg by mouth every 8 (eight) hours as needed for nausea or vomiting.)   ONETOUCH ULTRA test strip USE AS INSTRUCTED UP TO FOUR TIMES A DAY   oxyCODONE-acetaminophen (PERCOCET/ROXICET) 5-325 MG tablet Take 1 tablet by mouth every 8 (eight) hours as needed for severe pain. (Patient taking differently: Take 1 tablet by mouth at bedtime.)   pantoprazole (PROTONIX) 40 MG tablet Take 1 tablet (40 mg total) by mouth 2 (two) times daily.   Semaglutide,0.25 or 0.5MG /DOS, (OZEMPIC, 0.25 OR 0.5 MG/DOSE,) 2 MG/1.5ML SOPN Inject 0.5 mg into the skin once a week.    Allergies  Allergen Reactions   Sitagliptin Other (See Comments)     headache    Patient Active Problem List   Diagnosis Date Noted   Cough 04/08/2021   Dysphagia    Gastroesophageal reflux disease with esophagitis without hemorrhage    Peptic stricture of esophagus    Hiatal hernia    Gastritis and gastroduodenitis    Insomnia 01/01/2021   Port-A-Cath in place 12/04/2020   Encounter for antineoplastic immunotherapy 06/04/2020   Malignant neoplasm of right upper lobe of lung ([place]) 03/13/2020   Encounter for antineoplastic chemotherapy 03/13/2020   Goals of care, counseling/discussion 03/13/2020   Lung mass 03/11/2020   Gastroesophageal reflux disease without esophagitis 02/15/2018   Type 2 diabetes mellitus without complication, without long-term current use of insulin ([place]) 07/07/2017   Hyperlipidemia 07/07/2017    Family History  Problem Relation Age of Onset  High blood pressure Mother    Stroke Mother    Colon cancer Neg Hx    Esophageal cancer Neg Hx    Stomach cancer Neg Hx    Pancreatic cancer Neg Hx     Social History   Tobacco Use  Smoking Status Former   Packs/day: 2.00   Years: 43.00   Pack years: 86.00   Types: Cigarettes   Quit date: 04/15/2020   Years since quitting: 1.1  Smokeless Tobacco Never    Past Surgical History:  Procedure Laterality Date   BIOPSY  03/25/2021   Procedure: BIOPSY;  Surgeon: Lavena Bullion, DO;  Location: [facility];  Service: Gastroenterology;;   broken bone repair     BRONCHIAL BIOPSY  03/11/2020   Procedure: BRONCHIAL BIOPSIES;  Surgeon: Garner Nash, DO;  Location: [place] [facility];  Service: Pulmonary;;   COLONOSCOPY     around 41[place]    CRYOTHERAPY  03/11/2020   Procedure: CRYOTHERAPY;  Surgeon: Garner Nash, DO;  Location: [facility];  Service: Pulmonary;;   ESOPHAGEAL DILATION  03/25/2021   Procedure: ESOPHAGEAL DILATION;  Surgeon: Lavena Bullion, DO;  Location: [facility];  Service: Gastroenterology;;   ESOPHAGOGASTRODUODENOSCOPY (EGD) WITH PROPOFOL N/A  03/25/2021   Procedure: ESOPHAGOGASTRODUODENOSCOPY (EGD) WITH PROPOFOL;  Surgeon: Lavena Bullion, DO;  Location: [facility];  Service: Gastroenterology;  Laterality: N/A;  dialation of stricture   FINE NEEDLE ASPIRATION  03/11/2020   Procedure: FINE NEEDLE ASPIRATION (FNA) LINEAR;  Surgeon: Garner Nash, DO;  Location: [facility];  Service: Pulmonary;;   IR IMAGING GUIDED PORT INSERTION  10/16/2020   KNEE ARTHROSCOPY WITH ANTERIOR CRUCIATE LIGAMENT (ACL) REPAIR     x 2   VIDEO BRONCHOSCOPY WITH ENDOBRONCHIAL ULTRASOUND N/A 03/11/2020   Procedure: VIDEO BRONCHOSCOPY WITH ENDOBRONCHIAL ULTRASOUND;  Surgeon: Garner Nash, DO;  Location: [place];  Service: Pulmonary;  Laterality: N/A;    Immunization History  Administered Date(s) Administered   Influenza Split 07/24/2018   Influenza, Seasonal, Injecte, Preservative Fre 09/16/2016, 07/16/2017   Influenza,inj,Quad PF,6+ Mos 09/16/2016, 07/31/2018, 07/03/2019   Influenza-Unspecified 07/30/2020   PFIZER Comirnaty(Gray Top)Covid-19 Tri-Sucrose Vaccine 04/04/2021   PFIZER(Purple Top)SARS-COV-2 Vaccination 02/07/2020, 02/19/2020, 09/20/2020   Pneumococcal Polysaccharide-23 04/30/2019   Tdap 09/19/2018   Zoster Recombinat (Shingrix) 01/24/2021, 05/30/2021    Recent Results (from the past 2160 hour(s))  CBC with Differential (Cancer Center Only)     Status: Abnormal   Collection Time: 03/20/21  8:48 AM  Result Value Ref Range   WBC Count 14.8 (H) 4.0 - 10.5 K/uL   RBC 3.31 (L) 4.22 - 5.81 MIL/uL   Hemoglobin 10.1 (L) 13.0 - 17.0 g/dL   HCT 31.0 (L) 39.0 - 52.0 %   MCV 93.7 80.0 - 100.0 fL   MCH 30.5 26.0 - 34.0 pg   MCHC 32.6 30.0 - 36.0 g/dL   RDW 17.1 (H) 11.5 - 15.5 %   Platelet Count 182 150 - 400 K/uL   nRBC 0.0 0.0 - 0.2 %   Neutrophils Relative % 82 %   Neutro Abs 12.2 (H) 1.7 - 7.7 K/uL   Lymphocytes Relative 8 %   Lymphs Abs 1.2 0.7 - 4.0 K/uL   Monocytes Relative 7 %   Monocytes Absolute 1.1 (H) 0.1 - 1.0 K/uL    Eosinophils Relative 1 %   Eosinophils Absolute 0.1 0.0 - 0.5 K/uL   Basophils Relative 1 %   Basophils Absolute 0.1 0.0 - 0.1 K/uL   Immature Granulocytes 1 %  Abs Immature Granulocytes 0.08 (H) 0.00 - 0.07 K/uL    Comment: Performed at [facility] at [facility], [address] [place], [place] [address]  CMP ([place] only)     Status: Abnormal   Collection Time: 03/20/21  8:48 AM  Result Value Ref Range   Sodium 138 135 - 145 mmol/L   Potassium 4.4 3.5 - 5.1 mmol/L   Chloride 103 98 - 111 mmol/L   CO2 26 22 - 32 mmol/L   Glucose, Bld 115 (H) 70 - 99 mg/dL    Comment: Glucose reference range applies only to samples taken after fasting for at least 8 hours.   BUN 12 6 - 20 mg/dL   Creatinine 1.30 (H) 0.61 - 1.24 mg/dL   Calcium 9.7 8.9 - 10.3 mg/dL   Total Protein 7.1 6.5 - 8.1 g/dL   Albumin 3.9 3.5 - 5.0 g/dL   AST 15 15 - 41 U/L   ALT 15 0 - 44 U/L   Alkaline Phosphatase 135 (H) 38 - 126 U/L   Total Bilirubin 0.4 0.3 - 1.2 mg/dL   GFR, Estimated >60 >60 mL/min    Comment: (NOTE) Calculated using the CKD-EPI Creatinine Equation (2021)    Anion gap 9 5 - 15    Comment: Performed at [facility] at [facility], [address] [place], [place] [address]  SARS CORONAVIRUS 2 (TAT 6-24 HRS) Nasopharyngeal Nasopharyngeal Swab     Status: None   Collection Time: 03/20/21  9:41 AM   Specimen: Nasopharyngeal Swab  Result Value Ref Range   SARS Coronavirus 2 NEGATIVE NEGATIVE    Comment: (NOTE) SARS-CoV-2 target nucleic acids are NOT DETECTED.  The SARS-CoV-2 RNA is generally detectable in upper and lower respiratory specimens during the acute phase of infection. Negative results do not preclude SARS-CoV-2 infection, do not rule out co-infections with other pathogens, and should not be used as the sole basis for treatment or other patient management decisions. Negative results must be combined with clinical  observations, patient history, and epidemiological information. The expected result is Negative.  Fact Sheet for Patients: SugarRoll.be  Fact Sheet for Healthcare Providers: https://www.woods-mathews.com/  This test is not yet approved or cleared by the [place] FDA and  has been authorized for detection and/or diagnosis of SARS-CoV-2 by FDA under an Emergency Use Authorization (EUA). This EUA will remain  in effect (meaning this test can be used) for the duration of the COVID-19 declaration under Se ction 564(b)(1) of the Act, 21 U.S.C. section 360bbb-3(b)(1), unless the authorization is terminated or revoked sooner.  Performed at [facility], [place] [address], [place], [place] [address]   Glucose, capillary     Status: Abnormal   Collection Time: 03/25/21  8:39 AM  Result Value Ref Range   Glucose-Capillary 160 (H) 70 - 99 mg/dL    Comment: Glucose reference range applies only to samples taken after fasting for at least 8 hours.  Surgical pathology     Status: None   Collection Time: 03/25/21  9:38 AM  Result Value Ref Range   SURGICAL PATHOLOGY      SURGICAL PATHOLOGY CASE: [id number] PATIENT: [name] Surgical Pathology Report     Clinical History: Dysphagia; esophageal stricture, gastritis, esophagitis; evaluate for reflux changes vs radiation injury ([facility])     FINAL MICROSCOPIC DIAGNOSIS:  A. STOMACH, BIOPSY: - Gastric antral and oxyntic mucosa with slight chronic inflammation. - Warthin-Starry  negative for Helicobacter pylori. - No intestinal metaplasia, dysplasia or carcinoma.  B. ESOPHAGUS, BIOPSY: - Ulcer with atypia. - See comment.  COMMENT: B. There is squamous mucosa with inflamed granulation tissue and focal atypia characterized by nuclear enlargement, nucleoli and cytoplasmic vacuolization.  While severe reflux with atypia cannot be ruled out, the features in this case favor  radiation induced injury.  GROSS DESCRIPTION: A: Received in formalin are tan, soft tissue fragments that are submitted in toto. Number: 4 size: Range from 0.2 to 0.3 cm blocks: 1  B: Received in forma lin are tan, soft tissue fragments that are submitted in toto. Number: 4 Size: Range from 0.2 to 0.4 cm blocks: 1 (KL 5-11 22)  Final Diagnosis performed by Claudette Laws, MD.   Electronically signed 03/26/2021 Technical and / or Professional components performed at [facility], [place] [address], [place], [place] [address].  Immunohistochemistry Technical component (if applicable) was performed at [facility]. [address], [place], [place], [place] [address].   IMMUNOHISTOCHEMISTRY DISCLAIMER (if applicable): Some of these immunohistochemical stains may have been developed and the performance characteristics determine by [facility]. Some may not have been cleared or approved by the U.S. Food and Drug Administration. The FDA has determined that such clearance or approval is not necessary. This test is used for clinical purposes. It should not be regarded as investigational or for research. This laboratory is certified  under the [place] (CLIA-88) as qualified to perform high complexity clinical laboratory testing.  The controls stained appropriately.   CMP ([place] only)     Status: Abnormal   Collection Time: 03/26/21  8:23 AM  Result Value Ref Range   Sodium 142 135 - 145 mmol/L   Potassium 4.4 3.5 - 5.1 mmol/L   Chloride 107 98 - 111 mmol/L   CO2 23 22 - 32 mmol/L   Glucose, Bld 199 (H) 70 - 99 mg/dL    Comment: Glucose reference range applies only to samples taken after fasting for at least 8 hours.   BUN 20 6 - 20 mg/dL   Creatinine 1.35 (H) 0.61 - 1.24 mg/dL   Calcium 9.5 8.9 - 10.3 mg/dL   Total Protein 7.2 6.5 - 8.1 g/dL   Albumin 3.3 (L) 3.5 - 5.0 g/dL   AST 13 (L) 15 -  41 U/L   ALT 10 0 - 44 U/L   Alkaline Phosphatase 119 38 - 126 U/L   Total Bilirubin 0.4 0.3 - 1.2 mg/dL   GFR, Estimated >60 >60 mL/min    Comment: (NOTE) Calculated using the CKD-EPI Creatinine Equation (2021)    Anion gap 12 5 - 15    Comment: Performed at [facility], [place] [address], [place], [place] [address]  CBC with Differential ([place] Only)     Status: Abnormal   Collection Time: 03/26/21  8:23 AM  Result Value Ref Range   WBC Count 17.4 (H) 4.0 - 10.5 K/uL   RBC 3.14 (L) 4.22 - 5.81 MIL/uL   Hemoglobin 9.4 (L) 13.0 - 17.0 g/dL   HCT 29.6 (L) 39.0 - 52.0 %   MCV 94.3 80.0 - 100.0 fL   MCH 29.9 26.0 - 34.0 pg   MCHC 31.8 30.0 - 36.0 g/dL   RDW 17.1 (H) 11.5 - 15.5 %   Platelet Count 238 150 - 400 K/uL   nRBC 0.0 0.0 - 0.2 %   Neutrophils Relative %  89 %   Neutro Abs 15.5 (H) 1.7 - 7.7 K/uL   Lymphocytes Relative 4 %   Lymphs Abs 0.7 0.7 - 4.0 K/uL   Monocytes Relative 6 %   Monocytes Absolute 1.1 (H) 0.1 - 1.0 K/uL   Eosinophils Relative 0 %   Eosinophils Absolute 0.0 0.0 - 0.5 K/uL   Basophils Relative 0 %   Basophils Absolute 0.1 0.0 - 0.1 K/uL   Immature Granulocytes 1 %   Abs Immature Granulocytes 0.08 (H) 0.00 - 0.07 K/uL    Comment: Performed at [facility], [place] [address], [place], [place] [address]  TSH     Status: None   Collection Time: 03/26/21  8:26 AM  Result Value Ref Range   TSH 1.048 0.320 - 4.118 uIU/mL    Comment: Performed at [facility], [place] [address], [place], [place] [address]  CBC with Differential ([place] Only)     Status: Abnormal   Collection Time: 04/02/21  8:28 AM  Result Value Ref Range   WBC Count 7.5 4.0 - 10.5 K/uL   RBC 3.15 (L) 4.22 - 5.81 MIL/uL   Hemoglobin 9.6 (L) 13.0 - 17.0 g/dL   HCT 29.2 (L) 39.0 - 52.0 %   MCV 92.7 80.0 - 100.0 fL   MCH 30.5 26.0 - 34.0 pg   MCHC 32.9 30.0 - 36.0 g/dL   RDW 16.9 (H) 11.5 - 15.5 %   Platelet  Count 163 150 - 400 K/uL   nRBC 0.4 (H) 0.0 - 0.2 %   Neutrophils Relative % 68 %   Neutro Abs 5.2 1.7 - 7.7 K/uL   Lymphocytes Relative 12 %   Lymphs Abs 0.9 0.7 - 4.0 K/uL   Monocytes Relative 15 %   Monocytes Absolute 1.1 (H) 0.1 - 1.0 K/uL   Eosinophils Relative 2 %   Eosinophils Absolute 0.1 0.0 - 0.5 K/uL   Basophils Relative 1 %   Basophils Absolute 0.1 0.0 - 0.1 K/uL   Immature Granulocytes 2 %    Comment: Increased IG's, likely caused by Bone Marrow Colony Stimulating Factor received within 30 days.   Abs Immature Granulocytes 0.12 (H) 0.00 - 0.07 K/uL    Comment: Performed at [facility] at [facility], [address], [place], [place] [address]  CMP ([place] only)     Status: Abnormal   Collection Time: 04/02/21  8:28 AM  Result Value Ref Range   Sodium 139 135 - 145 mmol/L   Potassium 4.0 3.5 - 5.1 mmol/L   Chloride 104 98 - 111 mmol/L   CO2 25 22 - 32 mmol/L   Glucose, Bld 124 (H) 70 - 99 mg/dL    Comment: Glucose reference range applies only to samples taken after fasting for at least 8 hours.   BUN 16 6 - 20 mg/dL   Creatinine 1.15 0.61 - 1.24 mg/dL   Calcium 9.7 8.9 - 10.3 mg/dL   Total Protein 6.7 6.5 - 8.1 g/dL   Albumin 3.7 3.5 - 5.0 g/dL   AST 18 15 - 41 U/L   ALT 18 0 - 44 U/L   Alkaline Phosphatase 140 (H) 38 - 126 U/L   Total Bilirubin 0.5 0.3 - 1.2 mg/dL   GFR, Estimated >60 >60 mL/min    Comment: (NOTE) Calculated using the CKD-EPI Creatinine Equation (2021)    Anion gap 10 5 - 15    Comment: Performed at [facility], [place] [address] [place], [place] [address]  CMP ([place] only)     Status: Abnormal   Collection Time: 04/09/21  9:29 AM  Result Value Ref Range   Sodium 139 135 - 145 mmol/L   Potassium 4.7 3.5 - 5.1 mmol/L   Chloride 104 98 - 111 mmol/L   CO2 28 22 - 32 mmol/L   Glucose, Bld 87 70 - 99 mg/dL    Comment: Glucose reference range applies only to samples taken after  fasting for at least 8 hours.   BUN 15 6 - 20 mg/dL   Creatinine 1.37 (H) 0.61 - 1.24 mg/dL   Calcium 9.4 8.9 - 10.3 mg/dL   Total Protein 6.8 6.5 - 8.1 g/dL   Albumin 3.9 3.5 - 5.0 g/dL   AST 19 15 - 41 U/L   ALT 19 0 - 44 U/L   Alkaline Phosphatase 134 (H) 38 - 126 U/L   Total Bilirubin 0.3 0.3 - 1.2 mg/dL   GFR, Estimated 59 (L) >60 mL/min    Comment: (NOTE) Calculated using the CKD-EPI Creatinine Equation (2021)    Anion gap 7 5 - 15    Comment: Performed at [facility] at [facility] [address], [place], [place] [address]  CBC with Differential ([place] Only)     Status: Abnormal   Collection Time: 04/09/21  9:29 AM  Result Value Ref Range   WBC Count 15.3 (H) 4.0 - 10.5 K/uL   RBC 3.26 (L) 4.22 - 5.81 MIL/uL   Hemoglobin 9.7 (L) 13.0 - 17.0 g/dL   HCT 30.8 (L) 39.0 - 52.0 %   MCV 94.5 80.0 - 100.0 fL   MCH 29.8 26.0 - 34.0 pg   MCHC 31.5 30.0 - 36.0 g/dL   RDW 17.6 (H) 11.5 - 15.5 %   Platelet Count 164 150 - 400 K/uL   nRBC 0.3 (H) 0.0 - 0.2 %   Neutrophils Relative % 82 %   Neutro Abs 12.5 (H) 1.7 - 7.7 K/uL   Lymphocytes Relative 8 %   Lymphs Abs 1.2 0.7 - 4.0 K/uL   Monocytes Relative 7 %   Monocytes Absolute 1.1 (H) 0.1 - 1.0 K/uL   Eosinophils Relative 1 %   Eosinophils Absolute 0.2 0.0 - 0.5 K/uL   Basophils Relative 1 %   Basophils Absolute 0.1 0.0 - 0.1 K/uL   Immature Granulocytes 1 %   Abs Immature Granulocytes 0.11 (H) 0.00 - 0.07 K/uL    Comment: Performed at [facility] at [facility], [address], [place], [place] [address]  Total Protein, Urine dipstick     Status: Abnormal   Collection Time: 04/15/21  9:22 AM  Result Value Ref Range   Protein, ur <30 (A) NEGATIVE mg/dL    Comment: Performed at [facility], [address] [place], [place] [address]  CMP ([place] only)     Status: Abnormal   Collection Time: 04/15/21  9:28 AM  Result Value Ref  Range   Sodium 142 135 - 145 mmol/L   Potassium 4.5 3.5 - 5.1 mmol/L   Chloride 107 98 - 111 mmol/L   CO2 21 (L) 22 - 32 mmol/L   Glucose, Bld 228 (H) 70 - 99 mg/dL    Comment: Glucose reference range applies only to samples taken after fasting for at least 8 hours.   BUN 19 6 -  20 mg/dL   Creatinine 1.39 (H) 0.61 - 1.24 mg/dL   Calcium 9.4 8.9 - 10.3 mg/dL   Total Protein 7.1 6.5 - 8.1 g/dL   Albumin 3.2 (L) 3.5 - 5.0 g/dL   AST 12 (L) 15 - 41 U/L   ALT 12 0 - 44 U/L   Alkaline Phosphatase 108 38 - 126 U/L   Total Bilirubin 0.3 0.3 - 1.2 mg/dL   GFR, Estimated 58 (L) >60 mL/min    Comment: (NOTE) Calculated using the CKD-EPI Creatinine Equation (2021)    Anion gap 14 5 - 15    Comment: Performed at [facility], [place] [address] [place] [address], [place], [place] [address]  CBC with Differential ([place] Only)     Status: Abnormal   Collection Time: 04/15/21  9:28 AM  Result Value Ref Range   WBC Count 20.8 (H) 4.0 - 10.5 K/uL   RBC 2.91 (L) 4.22 - 5.81 MIL/uL   Hemoglobin 8.9 (L) 13.0 - 17.0 g/dL   HCT 27.3 (L) 39.0 - 52.0 %   MCV 93.8 80.0 - 100.0 fL   MCH 30.6 26.0 - 34.0 pg   MCHC 32.6 30.0 - 36.0 g/dL   RDW 18.0 (H) 11.5 - 15.5 %   Platelet Count 210 150 - 400 K/uL   nRBC 0.1 0.0 - 0.2 %   Neutrophils Relative % 88 %   Neutro Abs 18.3 (H) 1.7 - 7.7 K/uL   Lymphocytes Relative 5 %   Lymphs Abs 1.0 0.7 - 4.0 K/uL   Monocytes Relative 6 %   Monocytes Absolute 1.3 (H) 0.1 - 1.0 K/uL   Eosinophils Relative 0 %   Eosinophils Absolute 0.0 0.0 - 0.5 K/uL   Basophils Relative 0 %   Basophils Absolute 0.1 0.0 - 0.1 K/uL   Immature Granulocytes 1 %   Abs Immature Granulocytes 0.12 (H) 0.00 - 0.07 K/uL    Comment: Performed at [facility], [place] [address] [place], [place] [address]  TSH     Status: None   Collection Time: 04/15/21  9:28 AM  Result Value Ref Range   TSH 1.430 0.320 - 4.118 uIU/mL    Comment: Performed at [facility], [place] [address] [place], [place] [address]  CBC with Differential ([place] Only)     Status: Abnormal   Collection Time: 04/23/21  8:46 AM  Result Value Ref Range   WBC Count 10.1 4.0 - 10.5 K/uL   RBC 3.11 (L) 4.22 - 5.81 MIL/uL   Hemoglobin 9.5 (L) 13.0 - 17.0 g/dL   HCT 29.2 (L) 39.0 - 52.0 %   MCV 93.9 80.0 - 100.0 fL   MCH 30.5 26.0 - 34.0 pg   MCHC 32.5 30.0 - 36.0 g/dL   RDW 17.6 (H) 11.5 - 15.5 %   Platelet Count 173 150 - 400 K/uL   nRBC 0.6 (H) 0.0 - 0.2 %   Neutrophils Relative % 71 %   Neutro Abs 7.3 1.7 - 7.7 K/uL   Lymphocytes Relative 11 %   Lymphs Abs 1.1 0.7 - 4.0 K/uL   Monocytes Relative 14 %   Monocytes Absolute 1.4 (H) 0.1 - 1.0 K/uL   Eosinophils Relative 1 %   Eosinophils Absolute 0.1 0.0 - 0.5 K/uL   Basophils Relative 1 %   Basophils Absolute 0.1 0.0 - 0.1 K/uL   Immature Granulocytes 2 %   Abs Immature Granulocytes 0.16 (H) 0.00 - 0.07 K/uL  Comment: Performed at [facility] at [facility], [address] [address] [place], [place] [address]  CMP ([place] only)     Status: Abnormal   Collection Time: 04/23/21  8:46 AM  Result Value Ref Range   Sodium 135 135 - 145 mmol/L   Potassium 4.4 3.5 - 5.1 mmol/L   Chloride 102 98 - 111 mmol/L   CO2 24 22 - 32 mmol/L   Glucose, Bld 150 (H) 70 - 99 mg/dL    Comment: Glucose reference range applies only to samples taken after fasting for at least 8 hours.   BUN 23 (H) 6 - 20 mg/dL   Creatinine 1.52 (H) 0.61 - 1.24 mg/dL   Calcium 9.5 8.9 - 10.3 mg/dL   Total Protein 6.7 6.5 - 8.1 g/dL   Albumin 3.9 3.5 - 5.0 g/dL   AST 15 15 - 41 U/L   ALT 14 0 - 44 U/L   Alkaline Phosphatase 143 (H) 38 - 126 U/L   Total Bilirubin 0.4 0.3 - 1.2 mg/dL   GFR, Estimated 52 (L) >60 mL/min    Comment: (NOTE) Calculated using the CKD-EPI Creatinine Equation (2021)    Anion gap 9 5 - 15    Comment: Performed at [facility] at [facility],  [address], [place], [place] [address]  Total Protein, Urine dipstick     Status: Abnormal   Collection Time: 04/23/21  8:47 AM  Result Value Ref Range   Protein, ur <30 (A) NEGATIVE mg/dL    Comment: Performed at [facility] at [facility], [address], [place], [place] [address]  CBC with Differential ([place] Only)     Status: Abnormal   Collection Time: 04/30/21  8:16 AM  Result Value Ref Range   WBC Count 15.9 (H) 4.0 - 10.5 K/uL   RBC 3.46 (L) 4.22 - 5.81 MIL/uL   Hemoglobin 10.4 (L) 13.0 - 17.0 g/dL   HCT 32.5 (L) 39.0 - 52.0 %   MCV 93.9 80.0 - 100.0 fL   MCH 30.1 26.0 - 34.0 pg   MCHC 32.0 30.0 - 36.0 g/dL   RDW 17.9 (H) 11.5 - 15.5 %   Platelet Count 198 150 - 400 K/uL   nRBC 0.0 0.0 - 0.2 %   Neutrophils Relative % 81 %   Neutro Abs 13.0 (H) 1.7 - 7.7 K/uL   Lymphocytes Relative 8 %   Lymphs Abs 1.3 0.7 - 4.0 K/uL   Monocytes Relative 8 %   Monocytes Absolute 1.2 (H) 0.1 - 1.0 K/uL   Eosinophils Relative 1 %   Eosinophils Absolute 0.1 0.0 - 0.5 K/uL   Basophils Relative 1 %   Basophils Absolute 0.1 0.0 - 0.1 K/uL   Immature Granulocytes 1 %   Abs Immature Granulocytes 0.08 (H) 0.00 - 0.07 K/uL    Comment: Performed at [facility] at [facility], [address] [place], [place] [address]  CMP ([place] only)     Status: Abnormal   Collection Time: 04/30/21  8:16 AM  Result Value Ref Range   Sodium 137 135 - 145 mmol/L   Potassium 4.6 3.5 - 5.1 mmol/L   Chloride 102 98 - 111 mmol/L   CO2 25 22 - 32 mmol/L   Glucose, Bld 120 (H) 70 - 99 mg/dL    Comment: Glucose reference range applies only to samples taken  after fasting for at least 8 hours.   BUN 15 6 - 20 mg/dL   Creatinine 1.41 (H) 0.61 - 1.24 mg/dL   Calcium 9.8 8.9 - 10.3 mg/dL   Total Protein 6.7 6.5 - 8.1 g/dL   Albumin 4.0 3.5 - 5.0 g/dL   AST 15 15 - 41 U/L   ALT 14 0 - 44 U/L   Alkaline Phosphatase 139 (H) 38 - 126 U/L    Total Bilirubin 0.4 0.3 - 1.2 mg/dL   GFR, Estimated 57 (L) >60 mL/min    Comment: (NOTE) Calculated using the CKD-EPI Creatinine Equation (2021)    Anion gap 10 5 - 15    Comment: Performed at [facility] at [facility], [address] [place], [place] [address]  CBC with Differential ([place] Only)     Status: Abnormal   Collection Time: 05/07/21  8:42 AM  Result Value Ref Range   WBC Count 25.0 (H) 4.0 - 10.5 K/uL   RBC 3.24 (L) 4.22 - 5.81 MIL/uL   Hemoglobin 9.7 (L) 13.0 - 17.0 g/dL   HCT 29.7 (L) 39.0 - 52.0 %   MCV 91.7 80.0 - 100.0 fL   MCH 29.9 26.0 - 34.0 pg   MCHC 32.7 30.0 - 36.0 g/dL   RDW 17.4 (H) 11.5 - 15.5 %   Platelet Count 225 150 - 400 K/uL   nRBC 0.0 0.0 - 0.2 %   Neutrophils Relative % 90 %   Neutro Abs 22.7 (H) 1.7 - 7.7 K/uL   Lymphocytes Relative 3 %   Lymphs Abs 0.7 0.7 - 4.0 K/uL   Monocytes Relative 6 %   Monocytes Absolute 1.4 (H) 0.1 - 1.0 K/uL   Eosinophils Relative 0 %   Eosinophils Absolute 0.0 0.0 - 0.5 K/uL   Basophils Relative 0 %   Basophils Absolute 0.1 0.0 - 0.1 K/uL   Immature Granulocytes 1 %   Abs Immature Granulocytes 0.13 (H) 0.00 - 0.07 K/uL    Comment: Performed at [facility], [address], [place], [place] [address]  CMP ([place] only)     Status: Abnormal   Collection Time: 05/07/21  8:42 AM  Result Value Ref Range   Sodium 139 135 - 145 mmol/L   Potassium 4.4 3.5 - 5.1 mmol/L   Chloride 105 98 - 111 mmol/L   CO2 21 (L) 22 - 32 mmol/L   Glucose, Bld 206 (H) 70 - 99 mg/dL    Comment: Glucose reference range applies only to samples taken after fasting for at least 8 hours.   BUN 23 (H) 6 - 20 mg/dL   Creatinine 1.64 (H) 0.61 - 1.24 mg/dL   Calcium 10.1 8.9 - 10.3 mg/dL   Total Protein 7.6 6.5 - 8.1 g/dL   Albumin 3.0 (L) 3.5 - 5.0 g/dL   AST 14 (L) 15 - 41 U/L   ALT 13 0 - 44 U/L   Alkaline Phosphatase 110 38 - 126 U/L   Total Bilirubin 0.5 0.3 - 1.2  mg/dL   GFR, Estimated 48 (L) >60 mL/min    Comment: (NOTE) Calculated using the CKD-EPI Creatinine Equation (2021)    Anion gap 13 5 - 15    Comment: Performed at [facility], [place] [address], [place], [place] [address]  CMP ([place] only)     Status: Abnormal   Collection Time: 05/14/21  8:16 AM  Result Value Ref Range  Sodium 135 135 - 145 mmol/L   Potassium 4.3 3.5 - 5.1 mmol/L   Chloride 101 98 - 111 mmol/L   CO2 24 22 - 32 mmol/L   Glucose, Bld 183 (H) 70 - 99 mg/dL    Comment: Glucose reference range applies only to samples taken after fasting for at least 8 hours.   BUN 18 6 - 20 mg/dL   Creatinine 1.18 0.61 - 1.24 mg/dL   Calcium 10.0 8.9 - 10.3 mg/dL   Total Protein 6.9 6.5 - 8.1 g/dL   Albumin 3.8 3.5 - 5.0 g/dL   AST 10 (L) 15 - 41 U/L   ALT 9 0 - 44 U/L   Alkaline Phosphatase 151 (H) 38 - 126 U/L   Total Bilirubin 0.4 0.3 - 1.2 mg/dL   GFR, Estimated >60 >60 mL/min    Comment: (NOTE) Calculated using the CKD-EPI Creatinine Equation (2021)    Anion gap 10 5 - 15    Comment: Performed at [facility] at [facility], [address] [place], [place] [address]  CBC with Differential ([place] Only)     Status: Abnormal   Collection Time: 05/14/21  8:16 AM  Result Value Ref Range   WBC Count 13.0 (H) 4.0 - 10.5 K/uL   RBC 3.30 (L) 4.22 - 5.81 MIL/uL   Hemoglobin 9.9 (L) 13.0 - 17.0 g/dL   HCT 30.2 (L) 39.0 - 52.0 %   MCV 91.5 80.0 - 100.0 fL   MCH 30.0 26.0 - 34.0 pg   MCHC 32.8 30.0 - 36.0 g/dL   RDW 17.2 (H) 11.5 - 15.5 %   Platelet Count 238 150 - 400 K/uL   nRBC 0.0 0.0 - 0.2 %   Neutrophils Relative % 80 %   Neutro Abs 10.3 (H) 1.7 - 7.7 K/uL   Lymphocytes Relative 8 %   Lymphs Abs 1.1 0.7 - 4.0 K/uL   Monocytes Relative 9 %   Monocytes Absolute 1.2 (H) 0.1 - 1.0 K/uL   Eosinophils Relative 0 %   Eosinophils Absolute 0.0 0.0 - 0.5 K/uL   Basophils Relative 1 %   Basophils Absolute 0.1 0.0  - 0.1 K/uL   WBC Morphology TOXIC GRANULATION    Immature Granulocytes 2 %   Abs Immature Granulocytes 0.20 (H) 0.00 - 0.07 K/uL    Comment: Performed at [facility] at [facility], [address] [place], [place] [address]  TSH     Status: None   Collection Time: 05/14/21  8:17 AM  Result Value Ref Range   TSH 3.640 0.320 - 4.118 uIU/mL    Comment: Performed at [facility], [address], [place], [place] [address]  Total Protein, Urine dipstick     Status: Abnormal   Collection Time: 05/14/21  8:20 AM  Result Value Ref Range   Protein, ur 100 (A) NEGATIVE mg/dL    Comment: Performed at [facility] at [facility], [address] [place], [place] [address]  CBC with Differential ([facility] Only)     Status: Abnormal   Collection Time: 05/21/21  8:00 AM  Result Value Ref Range   WBC Count 16.7 (H) 4.0 - 10.5 K/uL   RBC 3.58 (L) 4.22 - 5.81 MIL/uL   Hemoglobin 10.7 (L) 13.0 - 17.0 g/dL   HCT 34.0 (L) 39.0 - 52.0 %   MCV 95.0 80.0 - 100.0 fL  MCH 29.9 26.0 - 34.0 pg   MCHC 31.5 30.0 - 36.0 g/dL   RDW 18.0 (H) 11.5 - 15.5 %   Platelet Count 144 (L) 150 - 400 K/uL   nRBC 0.1 0.0 - 0.2 %   Neutrophils Relative % 84 %   Neutro Abs 14.3 (H) 1.7 - 7.7 K/uL   Lymphocytes Relative 8 %   Lymphs Abs 1.3 0.7 - 4.0 K/uL   Monocytes Relative 5 %   Monocytes Absolute 0.9 0.1 - 1.0 K/uL   Eosinophils Relative 1 %   Eosinophils Absolute 0.1 0.0 - 0.5 K/uL   Basophils Relative 1 %   Basophils Absolute 0.1 0.0 - 0.1 K/uL   Immature Granulocytes 1 %   Abs Immature Granulocytes 0.10 (H) 0.00 - 0.07 K/uL    Comment: Performed at [facility] at [facility], [address], [place], [place] [address]  CMP ([place] only)     Status: Abnormal   Collection Time: 05/21/21  8:00 AM  Result Value Ref Range   Sodium 136 135 - 145 mmol/L   Potassium 5.0 3.5 - 5.1 mmol/L   Chloride  100 98 - 111 mmol/L   CO2 27 22 - 32 mmol/L   Glucose, Bld 124 (H) 70 - 99 mg/dL    Comment: Glucose reference range applies only to samples taken after fasting for at least 8 hours.   BUN 24 (H) 6 - 20 mg/dL   Creatinine 1.45 (H) 0.61 - 1.24 mg/dL   Calcium 9.4 8.9 - 10.3 mg/dL   Total Protein 6.6 6.5 - 8.1 g/dL   Albumin 3.7 3.5 - 5.0 g/dL   AST 13 (L) 15 - 41 U/L   ALT 10 0 - 44 U/L   Alkaline Phosphatase 128 (H) 38 - 126 U/L   Total Bilirubin 0.4 0.3 - 1.2 mg/dL   GFR, Estimated 55 (L) >60 mL/min    Comment: (NOTE) Calculated using the CKD-EPI Creatinine Equation (2021)    Anion gap 9 5 - 15    Comment: Performed at [facility] at [facility], [address] [place], [place] [address]  CMP ([place] only)     Status: Abnormal   Collection Time: 05/28/21  7:57 AM  Result Value Ref Range   Sodium 137 135 - 145 mmol/L   Potassium 4.7 3.5 - 5.1 mmol/L   Chloride 103 98 - 111 mmol/L   CO2 21 (L) 22 - 32 mmol/L   Glucose, Bld 275 (H) 70 - 99 mg/dL    Comment: Glucose reference range applies only to samples taken after fasting for at least 8 hours.   BUN 18 6 - 20 mg/dL   Creatinine 1.51 (H) 0.61 - 1.24 mg/dL   Calcium 9.3 8.9 - 10.3 mg/dL   Total Protein 6.9 6.5 - 8.1 g/dL   Albumin 3.0 (L) 3.5 - 5.0 g/dL   AST 9 (L) 15 - 41 U/L   ALT 8 0 - 44 U/L   Alkaline Phosphatase 114 38 - 126 U/L   Total Bilirubin 0.4 0.3 - 1.2 mg/dL   GFR, Estimated 53 (L) >60 mL/min    Comment: (NOTE) Calculated using the CKD-EPI Creatinine Equation (2021)    Anion gap 13 5 - 15    Comment: Performed at [facility], [place] [address], [place], [place] [address]  CBC with Differential ([facility] Only)     Status: Abnormal  Collection Time: 05/28/21  7:57 AM  Result Value Ref Range   WBC Count 19.3 (H) 4.0 - 10.5 K/uL   RBC 3.20 (L) 4.22 - 5.81 MIL/uL   Hemoglobin 9.5 (L) 13.0 - 17.0 g/dL   HCT 29.7 (L) 39.0 - 52.0 %   MCV 92.8 80.0 -  100.0 fL   MCH 29.7 26.0 - 34.0 pg   MCHC 32.0 30.0 - 36.0 g/dL   RDW 17.7 (H) 11.5 - 15.5 %   Platelet Count 211 150 - 400 K/uL   nRBC 0.0 0.0 - 0.2 %   Neutrophils Relative % 92 %   Neutro Abs 17.6 (H) 1.7 - 7.7 K/uL   Lymphocytes Relative 3 %   Lymphs Abs 0.6 (L) 0.7 - 4.0 K/uL   Monocytes Relative 5 %   Monocytes Absolute 1.0 0.1 - 1.0 K/uL   Eosinophils Relative 0 %   Eosinophils Absolute 0.0 0.0 - 0.5 K/uL   Basophils Relative 0 %   Basophils Absolute 0.0 0.0 - 0.1 K/uL   Immature Granulocytes 0 %   Abs Immature Granulocytes 0.07 0.00 - 0.07 K/uL    Comment: Performed at [facility], [address] [place], [place] [address]  Total Protein, Urine dipstick     Status: Abnormal   Collection Time: 05/28/21  8:18 AM  Result Value Ref Range   Protein, ur 30 (A) NEGATIVE mg/dL    Comment: Performed at [facility], [place] [address], [place], [place] [address]  CMP ([place] only)     Status: Abnormal   Collection Time: 06/04/21  8:30 AM  Result Value Ref Range   Sodium 135 135 - 145 mmol/L   Potassium 4.2 3.5 - 5.1 mmol/L   Chloride 102 98 - 111 mmol/L   CO2 23 22 - 32 mmol/L   Glucose, Bld 153 (H) 70 - 99 mg/dL    Comment: Glucose reference range applies only to samples taken after fasting for at least 8 hours.   BUN 14 6 - 20 mg/dL   Creatinine 1.29 (H) 0.61 - 1.24 mg/dL   Calcium 9.3 8.9 - 10.3 mg/dL   Total Protein 6.5 6.5 - 8.1 g/dL   Albumin 3.5 3.5 - 5.0 g/dL   AST 12 (L) 15 - 41 U/L   ALT 8 0 - 44 U/L   Alkaline Phosphatase 119 38 - 126 U/L   Total Bilirubin 0.5 0.3 - 1.2 mg/dL   GFR, Estimated >60 >60 mL/min    Comment: (NOTE) Calculated using the CKD-EPI Creatinine Equation (2021)    Anion gap 10 5 - 15    Comment: Performed at [facility] at [facility], [address] [place],  [address]  CBC with Differential (Cancer Center Only)     Status: Abnormal   Collection  Time: 06/04/21  8:30 AM  Result Value Ref Range   WBC Count 6.6 4.0 - 10.5 K/uL   RBC 3.18 (L) 4.22 - 5.81 MIL/uL   Hemoglobin 9.5 (L) 13.0 - 17.0 g/dL   HCT 29.1 (L) 39.0 - 52.0 %   MCV 91.5 80.0 - 100.0 fL   MCH 29.9 26.0 - 34.0 pg   MCHC 32.6 30.0 - 36.0 g/dL   RDW 17.6 (H) 11.5 - 15.5 %   Platelet Count 147 (L) 150 - 400 K/uL   nRBC 0.0 0.0 - 0.2 %   Neutrophils Relative % 75 %   Neutro Abs 5.0  1.7 - 7.7 K/uL   Lymphocytes Relative 9 %   Lymphs Abs 0.6 (L) 0.7 - 4.0 K/uL   Monocytes Relative 13 %   Monocytes Absolute 0.9 0.1 - 1.0 K/uL   Eosinophils Relative 1 %   Eosinophils Absolute 0.1 0.0 - 0.5 K/uL   Basophils Relative 1 %   Basophils Absolute 0.1 0.0 - 0.1 K/uL   Immature Granulocytes 1 %   Abs Immature Granulocytes 0.05 0.00 - 0.07 K/uL    Comment: Performed at [facility] at [facility], [address] [place], [place] [address]  Sample to Blood Bank     Status: None   Collection Time: 06/04/21  8:30 AM  Result Value Ref Range   Blood Bank Specimen SAMPLE AVAILABLE FOR TESTING    Sample Expiration      06/07/2021,2359 Performed at [facility], [place] [address], [place], [place] [address]   Total Protein, Urine dipstick     Status: Abnormal   Collection Time: 06/04/21 10:30 AM  Result Value Ref Range   Protein, ur 30 (A) NEGATIVE mg/dL    Comment: Performed at [facility] at [facility], [address] [place], [place] [address]  POCT HgB A1C     Status: Abnormal   Collection Time: 06/10/21  8:32 AM  Result Value Ref Range   Hemoglobin A1C 6.8 (A) 4.0 - 5.6 %   HbA1c POC (<> result, manual entry)     HbA1c, POC (prediabetic range)     HbA1c, POC (controlled diabetic range)    CMP (Cancer Center only)     Status: Abnormal   Collection Time: 06/10/21  9:47 AM  Result Value Ref Range   Sodium 138 135 - 145 mmol/L   Potassium 5.1 3.5 - 5.1 mmol/L   Chloride 101 98 - 111 mmol/L   CO2  26 22 - 32 mmol/L   Glucose, Bld 124 (H) 70 - 99 mg/dL    Comment: Glucose reference range applies only to samples taken after fasting for at least 8 hours.   BUN 12 6 - 20 mg/dL   Creatinine 1.76 (H) 0.61 - 1.24 mg/dL   Calcium 9.8 8.9 - 10.3 mg/dL   Total Protein 7.1 6.5 - 8.1 g/dL   Albumin 3.8 3.5 - 5.0 g/dL   AST 15 15 - 41 U/L   ALT 11 0 - 44 U/L   Alkaline Phosphatase 156 (H) 38 - 126 U/L   Total Bilirubin 0.3 0.3 - 1.2 mg/dL   GFR, Estimated 44 (L) >60 mL/min    Comment: (NOTE) Calculated using the CKD-EPI Creatinine Equation (2021)    Anion gap 11 5 - 15    Comment: Performed at [facility] at [facility], [address], [place], [place] [address]  CBC with Differential (Cancer Center Only)     Status: Abnormal   Collection Time: 06/10/21  9:47 AM  Result Value Ref Range   WBC Count 20.7 (H) 4.0 - 10.5 K/uL   RBC 3.52 (L) 4.22 - 5.81 MIL/uL   Hemoglobin 10.4 (L) 13.0 - 17.0 g/dL   HCT 33.0 (L) 39.0 - 52.0 %   MCV 93.8 80.0 - 100.0 fL   MCH 29.5 26.0 - 34.0 pg   MCHC 31.5 30.0 - 36.0 g/dL   RDW 18.6 (H) 11.5 - 15.5 %   Platelet Count 193 150 - 400 K/uL   nRBC 0.1  0.0 - 0.2 %   Neutrophils Relative % 86 %   Neutro Abs 17.9 (H) 1.7 - 7.7 K/uL   Lymphocytes Relative 6 %   Lymphs Abs 1.2 0.7 - 4.0 K/uL   Monocytes Relative 6 %   Monocytes Absolute 1.3 (H) 0.1 - 1.0 K/uL   Eosinophils Relative 1 %   Eosinophils Absolute 0.1 0.0 - 0.5 K/uL   Basophils Relative 0 %   Basophils Absolute 0.1 0.0 - 0.1 K/uL   Immature Granulocytes 1 %   Abs Immature Granulocytes 0.20 (H) 0.00 - 0.07 K/uL    Comment: Performed at [facility] Lab at [facility], [address] [place], [place] [address]    CT Chest W Contrast  Result Date: 06/09/2021 CLINICAL DATA:  Primary Cancer Type: Lung Imaging Indication: Assess response to therapy Interval therapy since last imaging? Yes Initial Cancer Diagnosis Date: 03/11/2020; Established by:  Biopsy-proven Detailed Pathology: Stage IIIA non-small cell lung cancer, squamous cell carcinoma. Primary Tumor location: Right upper lobe. Surgeries: Esophageal dilation 03/25/2021. Chemotherapy: Yes; Ongoing? Yes; Most recent administration: 05/28/2021 Immunotherapy?  Yes; Type: Keytruda; Ongoing? No Radiation therapy? Yes Date Range: 03/17/2021 - 03/24/2021; Target: Right upper lung. Date Range: 03/31/2020 - 05/15/2020; Target: Right lung. EXAM: CT CHEST, ABDOMEN, AND PELVIS WITH CONTRAST TECHNIQUE: Multidetector CT imaging of the chest, abdomen and pelvis was performed following the standard protocol during bolus administration of intravenous contrast. CONTRAST:  144mL OMNIPAQUE IOHEXOL 300 MG/ML  SOLN COMPARISON:  Most recent CT chest, abdomen and pelvis 04/20/2021. 09/16/2020 PET-CT. FINDINGS: CT CHEST FINDINGS Cardiovascular: Left Port-A-Cath tip at high right atrium. Aortic atherosclerosis. Normal heart size, without pericardial effusion. No central pulmonary embolism, on this non-dedicated study. Mediastinum/Nodes: Right supraclavicular node measures 2.0 cm on 05/02 versus 1.6 cm on the prior. Right paratracheal node measures 10 mm on 21/2 and is not significantly changed. Subcarinal node measures 1.2 cm on 29/2 and is similar to on the prior. No hilar adenopathy. Lungs/Pleura: Small right pleural effusion is not significantly changed. Right apical lung nodule measures 2.4 x 1.4 cm on 36/3 (Previously 2.5 x 1.7 cm.) Demonstrates new or increased cavitation. Right suprahilar upper lobe lung mass measures 4.1 x 3.5 cm on 54/3 (Previously 4.2 x 3.3 cm.) Right apical and upper lobe interstitial thickening is grossly similar and likely radiation induced. Right medial lower lobe consolidation and volume loss are also likely radiation induced. New 3 mm right apical pulmonary nodule on 42/3. New 3 mm right upper lobe pulmonary nodule on 44/3. Similar left apical nodularity, including at up to 8 mm on 21/3. This is  again favored to represent scarring. 3 mm left lower lobe pulmonary nodule on 101/3, new. 2-3 mm pulmonary nodules x2 more inferiorly in the left lower lobe on images 116 and 117 of series 3, new. Musculoskeletal: No acute osseous abnormality. CT ABDOMEN PELVIS FINDINGS Hepatobiliary: Normal liver. Normal gallbladder, without biliary ductal dilatation. Pancreas: Normal, without mass or ductal dilatation. Spleen: Normal in size, without focal abnormality. Adrenals/Urinary Tract: Normal adrenal glands. Normal kidneys, without hydronephrosis. Normal urinary bladder. Stomach/Bowel: Normal stomach, without wall thickening. Scattered colonic diverticula. Normal terminal ileum and appendix. Normal small bowel. Vascular/Lymphatic: Aortic atherosclerosis. Celiac ectasia is mild similar. No abdominopelvic adenopathy. Reproductive: Normal prostate. Other: Small bilateral fat containing inguinal hernias. No significant free fluid. No evidence of omental or peritoneal disease. Musculoskeletal: No acute osseous abnormality. IMPRESSION: 1. Right apical and suprahilar lung lesions are similar in size. 2. Development of tiny  bilateral pulmonary nodules, indeterminate but moderately suspicious for metastasis. 3. Enlargement of a right supraclavicular nodal metastasis. Other thoracic nodes are unchanged in size. 4. No findings of extrathoracic metastatic disease. 5. Similar small right pleural effusion. 6.  Aortic Atherosclerosis (ICD10-I70.0). Electronically Signed   By: Abigail Miyamoto M.D.   On: 06/09/2021 11:20   CT Abdomen Pelvis W Contrast  Result Date: 06/09/2021 CLINICAL DATA:  Primary Cancer Type: Lung Imaging Indication: Assess response to therapy Interval therapy since last imaging? Yes Initial Cancer Diagnosis Date: 03/11/2020; Established by: Biopsy-proven Detailed Pathology: Stage IIIA non-small cell lung cancer, squamous cell carcinoma. Primary Tumor location: Right upper lobe. Surgeries: Esophageal dilation  03/25/2021. Chemotherapy: Yes; Ongoing? Yes; Most recent administration: 05/28/2021 Immunotherapy?  Yes; Type: Keytruda; Ongoing? No Radiation therapy? Yes Date Range: 03/17/2021 - 03/24/2021; Target: Right upper lung. Date Range: 03/31/2020 - 05/15/2020; Target: Right lung. EXAM: CT CHEST, ABDOMEN, AND PELVIS WITH CONTRAST TECHNIQUE: Multidetector CT imaging of the chest, abdomen and pelvis was performed following the standard protocol during bolus administration of intravenous contrast. CONTRAST:  141mL OMNIPAQUE IOHEXOL 300 MG/ML  SOLN COMPARISON:  Most recent CT chest, abdomen and pelvis 04/20/2021. 09/16/2020 PET-CT. FINDINGS: CT CHEST FINDINGS Cardiovascular: Left Port-A-Cath tip at high right atrium. Aortic atherosclerosis. Normal heart size, without pericardial effusion. No central pulmonary embolism, on this non-dedicated study. Mediastinum/Nodes: Right supraclavicular node measures 2.0 cm on 05/02 versus 1.6 cm on the prior. Right paratracheal node measures 10 mm on 21/2 and is not significantly changed. Subcarinal node measures 1.2 cm on 29/2 and is similar to on the prior. No hilar adenopathy. Lungs/Pleura: Small right pleural effusion is not significantly changed. Right apical lung nodule measures 2.4 x 1.4 cm on 36/3 (Previously 2.5 x 1.7 cm.) Demonstrates new or increased cavitation. Right suprahilar upper lobe lung mass measures 4.1 x 3.5 cm on 54/3 (Previously 4.2 x 3.3 cm.) Right apical and upper lobe interstitial thickening is grossly similar and likely radiation induced. Right medial lower lobe consolidation and volume loss are also likely radiation induced. New 3 mm right apical pulmonary nodule on 42/3. New 3 mm right upper lobe pulmonary nodule on 44/3. Similar left apical nodularity, including at up to 8 mm on 21/3. This is again favored to represent scarring. 3 mm left lower lobe pulmonary nodule on 101/3, new. 2-3 mm pulmonary nodules x2 more inferiorly in the left lower lobe on images 116  and 117 of series 3, new. Musculoskeletal: No acute osseous abnormality. CT ABDOMEN PELVIS FINDINGS Hepatobiliary: Normal liver. Normal gallbladder, without biliary ductal dilatation. Pancreas: Normal, without mass or ductal dilatation. Spleen: Normal in size, without focal abnormality. Adrenals/Urinary Tract: Normal adrenal glands. Normal kidneys, without hydronephrosis. Normal urinary bladder. Stomach/Bowel: Normal stomach, without wall thickening. Scattered colonic diverticula. Normal terminal ileum and appendix. Normal small bowel. Vascular/Lymphatic: Aortic atherosclerosis. Celiac ectasia is mild similar. No abdominopelvic adenopathy. Reproductive: Normal prostate. Other: Small bilateral fat containing inguinal hernias. No significant free fluid. No evidence of omental or peritoneal disease. Musculoskeletal: No acute osseous abnormality. IMPRESSION: 1. Right apical and suprahilar lung lesions are similar in size. 2. Development of tiny bilateral pulmonary nodules, indeterminate but moderately suspicious for metastasis. 3. Enlargement of a right supraclavicular nodal metastasis. Other thoracic nodes are unchanged in size. 4. No findings of extrathoracic metastatic disease. 5. Similar small right pleural effusion. 6.  Aortic Atherosclerosis (ICD10-I70.0). Electronically Signed   By: Abigail Miyamoto M.D.   On: 06/09/2021 11:20       All questions at time  of visit were answered - patient instructed to contact office with any additional concerns or updates. ER/RTC precautions were reviewed with the patient as applicable.   Please note: manual typing as well as voice recognition software may have been used to produce this document - typos may escape review. Please contact Dr. Sheppard Coil for any needed clarifications.

## 2021-06-10 NOTE — Anesthesia Preprocedure Evaluation (Addendum)
Anesthesia Evaluation  Patient identified by MRN, date of birth, ID band Patient awake    Reviewed: Allergy & Precautions, NPO status , Patient's Chart, lab work & pertinent test results  Airway Mallampati: II  TM Distance: >3 FB Neck ROM: Full    Dental no notable dental hx. (+) Teeth Intact, Dental Advisory Given   Pulmonary former smoker,    Pulmonary exam normal breath sounds clear to auscultation       Cardiovascular negative cardio ROS Normal cardiovascular exam Rhythm:Regular Rate:Normal     Neuro/Psych negative psych ROS   GI/Hepatic Neg liver ROS, hiatal hernia, GERD  ,  Endo/Other  diabetes  Renal/GU negative Renal ROS     Musculoskeletal negative musculoskeletal ROS (+)   Abdominal   Peds  Hematology   Anesthesia Other Findings All: Sitagliptin  L lung CA  Reproductive/Obstetrics                            Anesthesia Physical Anesthesia Plan  ASA: 3  Anesthesia Plan: MAC   Post-op Pain Management:    Induction:   PONV Risk Score and Plan: Treatment may vary due to age or medical condition  Airway Management Planned: Natural Airway and Nasal Cannula  Additional Equipment: None  Intra-op Plan:   Post-operative Plan:   Informed Consent: I have reviewed the patients History and Physical, chart, labs and discussed the procedure including the risks, benefits and alternatives for the proposed anesthesia with the patient or authorized representative who has indicated his/her understanding and acceptance.     Dental advisory given  Plan Discussed with:   Anesthesia Plan Comments: (EGD for esophageal strciture)       Anesthesia Quick Evaluation

## 2021-06-11 ENCOUNTER — Ambulatory Visit (HOSPITAL_COMMUNITY): Payer: BC Managed Care – PPO | Admitting: Anesthesiology

## 2021-06-11 ENCOUNTER — Other Ambulatory Visit: Payer: BC Managed Care – PPO

## 2021-06-11 ENCOUNTER — Other Ambulatory Visit: Payer: Self-pay

## 2021-06-11 ENCOUNTER — Encounter (HOSPITAL_COMMUNITY): Payer: Self-pay | Admitting: Gastroenterology

## 2021-06-11 ENCOUNTER — Inpatient Hospital Stay: Payer: BC Managed Care – PPO

## 2021-06-11 ENCOUNTER — Ambulatory Visit (HOSPITAL_COMMUNITY)
Admission: RE | Admit: 2021-06-11 | Discharge: 2021-06-11 | Disposition: A | Discharge status: Home / Self Care | Payer: BC Managed Care – PPO | Attending: Gastroenterology | Admitting: Gastroenterology

## 2021-06-11 ENCOUNTER — Encounter (HOSPITAL_COMMUNITY)
Admission: RE | Disposition: A | Discharge status: Home / Self Care | Payer: Self-pay | Source: Home / Self Care | Attending: Gastroenterology

## 2021-06-11 DIAGNOSIS — K2100 Gastro-esophageal reflux disease with esophagitis, without bleeding: Secondary | ICD-10-CM | POA: Insufficient documentation

## 2021-06-11 DIAGNOSIS — Z87891 Personal history of nicotine dependence: Secondary | ICD-10-CM | POA: Diagnosis not present

## 2021-06-11 DIAGNOSIS — R053 Chronic cough: Secondary | ICD-10-CM | POA: Diagnosis not present

## 2021-06-11 DIAGNOSIS — C3490 Malignant neoplasm of unspecified part of unspecified bronchus or lung: Secondary | ICD-10-CM | POA: Diagnosis not present

## 2021-06-11 DIAGNOSIS — K449 Diaphragmatic hernia without obstruction or gangrene: Secondary | ICD-10-CM | POA: Insufficient documentation

## 2021-06-11 DIAGNOSIS — E119 Type 2 diabetes mellitus without complications: Secondary | ICD-10-CM | POA: Diagnosis not present

## 2021-06-11 DIAGNOSIS — E7800 Pure hypercholesterolemia, unspecified: Secondary | ICD-10-CM | POA: Diagnosis not present

## 2021-06-11 DIAGNOSIS — K222 Esophageal obstruction: Secondary | ICD-10-CM | POA: Diagnosis not present

## 2021-06-11 DIAGNOSIS — K319 Disease of stomach and duodenum, unspecified: Secondary | ICD-10-CM | POA: Diagnosis not present

## 2021-06-11 DIAGNOSIS — Z87442 Personal history of urinary calculi: Secondary | ICD-10-CM | POA: Insufficient documentation

## 2021-06-11 DIAGNOSIS — Z923 Personal history of irradiation: Secondary | ICD-10-CM | POA: Diagnosis not present

## 2021-06-11 DIAGNOSIS — Z9221 Personal history of antineoplastic chemotherapy: Secondary | ICD-10-CM | POA: Diagnosis not present

## 2021-06-11 DIAGNOSIS — K2970 Gastritis, unspecified, without bleeding: Secondary | ICD-10-CM | POA: Diagnosis not present

## 2021-06-11 HISTORY — PX: BIOPSY: SHX5522

## 2021-06-11 HISTORY — PX: ESOPHAGOGASTRODUODENOSCOPY (EGD) WITH PROPOFOL: SHX5813

## 2021-06-11 LAB — GLUCOSE, CAPILLARY: Glucose-Capillary: 105 mg/dL — ABNORMAL HIGH (ref 70–99)

## 2021-06-11 SURGERY — ESOPHAGOGASTRODUODENOSCOPY (EGD) WITH PROPOFOL
Anesthesia: Monitor Anesthesia Care

## 2021-06-11 MED ORDER — DEXMEDETOMIDINE (PRECEDEX) IN NS 20 MCG/5ML (4 MCG/ML) IV SYRINGE
PREFILLED_SYRINGE | INTRAVENOUS | Status: DC | PRN
  Administered 2021-06-11: 4 ug via INTRAVENOUS
  Administered 2021-06-11 (×2): 8 ug via INTRAVENOUS

## 2021-06-11 MED ORDER — PROPOFOL 500 MG/50ML IV EMUL
INTRAVENOUS | Status: DC | PRN
  Administered 2021-06-11: 175 ug/kg/min via INTRAVENOUS

## 2021-06-11 MED ORDER — SODIUM CHLORIDE 0.9 % IV SOLN: INTRAVENOUS | Status: DC

## 2021-06-11 MED ORDER — LACTATED RINGERS IV SOLN
INTRAVENOUS | Status: AC | PRN
  Administered 2021-06-11: 10 mL/h via INTRAVENOUS

## 2021-06-11 MED ORDER — PANTOPRAZOLE SODIUM 40 MG PO TBEC: 40.0000 mg | DELAYED_RELEASE_TABLET | Freq: Every day | ORAL | 3 refills | Status: DC

## 2021-06-11 MED ORDER — DEXMEDETOMIDINE (PRECEDEX) IN NS 20 MCG/5ML (4 MCG/ML) IV SYRINGE
PREFILLED_SYRINGE | INTRAVENOUS | Status: AC
  Filled 2021-06-11: qty 5

## 2021-06-11 MED ORDER — PROPOFOL 1000 MG/100ML IV EMUL
INTRAVENOUS | Status: AC
  Filled 2021-06-11: qty 100

## 2021-06-11 SURGICAL SUPPLY — 14 items

## 2021-06-11 NOTE — Anesthesia Postprocedure Evaluation (Signed)
Anesthesia Post Note  Patient: William Farrell  Procedure(s) Performed: ESOPHAGOGASTRODUODENOSCOPY (EGD) WITH PROPOFOL BIOPSY     Patient location during evaluation: Endoscopy Anesthesia Type: MAC Level of consciousness: awake and alert Pain management: pain level controlled Vital Signs Assessment: post-procedure vital signs reviewed and stable Respiratory status: spontaneous breathing, nonlabored ventilation, respiratory function stable and patient connected to nasal cannula oxygen Cardiovascular status: blood pressure returned to baseline and stable Postop Assessment: no apparent nausea or vomiting Anesthetic complications: no   No notable events documented.  Last Vitals:  Vitals:   06/11/21 1020 06/11/21 1044  BP: 92/64 111/74  Pulse: 91 88  Resp: (!) 23 13  Temp:    SpO2: 94% 99%    Last Pain:  Vitals:   06/11/21 1020  TempSrc:   PainSc: 0-No pain                 Barnet Glasgow

## 2021-06-11 NOTE — Transfer of Care (Addendum)
Immediate Anesthesia Transfer of Care Note  Patient: William Farrell  Procedure(s) Performed: ESOPHAGOGASTRODUODENOSCOPY (EGD) WITH PROPOFOL BIOPSY  Patient Location: Endoscopy Unit  Anesthesia Type:MAC  Level of Consciousness: drowsy  Airway & Oxygen Therapy: Patient Spontanous Breathing and Patient connected to face mask oxygen  Post-op Assessment: Report given to RN and Post -op Vital signs reviewed and stable  Post vital signs: Reviewed and stable  Last Vitals:  Vitals Value Taken Time  BP    Temp    Pulse    Resp    SpO2      Last Pain:  Vitals:   06/11/21 0838  TempSrc: Oral  PainSc: 0-No pain         Complications: No notable events documented.

## 2021-06-11 NOTE — Op Note (Signed)
[facility] Patient Name: William Farrell Procedure Date: 06/11/2021 MRN: [id number] Attending MD: Gerrit Heck , MD Date of Birth: 24-Aug-1961 CSN: [id number] Age: 60 Admit Type: Outpatient Procedure:                Upper GI endoscopy Indications:              Follow-up of reflux esophagitis, Follow-up of                            esophageal stricture, Chronic cough                           EGD on 03/25/2021 with LA Grade C esophagitis and 2                            cm hiatal hernia, along with distal esophageal                            stricture which was dilated with a 20 mm TTS                            balloon. He has since had resolution of dysphagia,                            and reflux symptoms well controlled. Does have a                            newer symptom of certain food intolerances and                            dysguesia with certain foods, but not dysphagia. Providers:                Gerrit Heck, MD, Terrall Laity, Kary Kos                            RN, RN, Tyna Jaksch Technician Referring MD:              Medicines:                Monitored Anesthesia Care Complications:            No immediate complications. Estimated Blood Loss:     Estimated blood loss was minimal. Procedure:                Pre-Anesthesia Assessment:                           - Prior to the procedure, a History and Physical                            was performed, and patient medications and                            allergies were reviewed. The patient's tolerance of  previous anesthesia was also reviewed. The risks                            and benefits of the procedure and the sedation                            options and risks were discussed with the patient.                            All questions were answered, and informed consent                            was obtained. Prior Anticoagulants: The patient has                             taken no previous anticoagulant or antiplatelet                            agents. ASA Grade Assessment: III - A patient with                            severe systemic disease. After reviewing the risks                            and benefits, the patient was deemed in                            satisfactory condition to undergo the procedure.                           After obtaining informed consent, the endoscope was                            passed under direct vision. Throughout the                            procedure, the patient's blood pressure, pulse, and                            oxygen saturations were monitored continuously. The                            GIF-H190 ([id number] Olympus gastroscope was                            introduced through the mouth, and advanced to the                            second part of duodenum. The upper GI endoscopy was                            accomplished without difficulty. The patient  tolerated the procedure well. Scope In: Scope Out: Findings:      The upper third of the esophagus, middle third of the esophagus and       lower third of the esophagus were normal. The previously noted reflux       esophagitis has since healed. There is no recurrence of the       previously-treated esophageal stricture.      A 2 cm sliding-type hiatal hernia was present.      Scattered mild inflammation characterized by erythema was found in the       gastric body and in the gastric antrum. Biopsies were taken with a cold       forceps for Helicobacter pylori testing. Estimated blood loss was       minimal.      The examined duodenum was normal. Impression:               - Normal upper third of esophagus, middle third of                            esophagus and lower third of esophagus.                           - 2 cm hiatal hernia.                           - Gastritis. Biopsied.                            - Normal examined duodenum. Moderate Sedation:      Not Applicable - Patient had care per Anesthesia. Recommendation:           - Patient has a contact number available for                            emergencies. The signs and symptoms of potential                            delayed complications were discussed with the                            patient. Return to normal activities tomorrow.                            Written discharge instructions were provided to the                            patient.                           - Resume previous diet.                           - Continue present medications.                           - Await pathology results.                           -  Can reduce Protonix to 40 mg daily.                           - Repeat upper endoscopy PRN. Procedure Code(s):        --- Professional ---                           [phone], Esophagogastroduodenoscopy, flexible,                            transoral; with biopsy, single or multiple Diagnosis Code(s):        --- Professional ---                           K44.9, Diaphragmatic hernia without obstruction or                            gangrene                           K29.70, Gastritis, unspecified, without bleeding                           K21.00, Gastro-esophageal reflux disease with                            esophagitis, without bleeding                           K22.2, Esophageal obstruction                           R05, Cough CPT copyright 2019 [employer]. All rights reserved. The codes documented in this report are preliminary and upon coder review may  be revised to meet current compliance requirements. Gerrit Heck, MD 06/11/2021 10:14:21 AM Number of Addenda: 0

## 2021-06-11 NOTE — H&P (Signed)
Chief Complaint:    GERD with erosive esophagitis, esophageal stricture   HPI:     60 y.o. male with a history of stage III non-small cell lung cancer, squamous cell carcinoma diagnosed 02/2020 treated with chemoradiation and currently systemic treatment and radiotherapy, HLD, diabetes, nephrolithiasis, initially seen in the GI clinic on 03/07/2021 for evaluation of dysphagia and regurgitation.   EGD 03/25/2021 notable for LA Grade C erosive esophagitis and distal esophageal stricture, treated with 20 mm TTS balloon.  Biopsies from the stricture demonstrate injury more likely to be 2/2 radiation rather than reflux induced peptic stricture.  Was treated with high-dose PPI and Carafate with significant clinical improvement and resolution of dysphagia.  He took Carafate for 1 week, and continues to take Protonix 40 mg bid as prescribed.   He was last seen in the office on 04/29/2021.  Reports significant improvement in reflux symptoms along with resolution of dysphagia with prior intervention.  He presents today to [facility] for repeat upper endoscopy to ensure appropriate mucosal healing of the previous severe esophagitis along with repeat esophageal dilation as appropriate if recurrence of stricture.  Continues to follow in the Pulmonary Clinic for lung CA, posttussive emesis, chronic cough. Currently undergoing second line systemic palliative chemotherapy with docetaxel and Cyramza every 3 weeks with Neulasta support and along with palliative radiotherapy to enlarging RUL mass by Dr. Lisbeth Renshaw.   Endoscopic History: -Colonoscopy years ago- normal per patient. Does not want to go through repeat colonoscopy. -EGD years ago at that time- reflux esophagitis per patient, treated with Prevacid - EGD (03/2021): LA Grade C erosive esophagitis, stricture at 40 cm dilated with 20 mm TTS balloon (path favors radiation-induced injury), 2 cm HH, mild non-H. pylori gastritis    Review of  systems:     No chest pain, no SOB, no fevers, no urinary sx   Past Medical History:  Diagnosis Date   Asthma    as a child   Diabetes ([place])    Diverticulitis    GERD (gastroesophageal reflux disease)    High cholesterol    History of kidney stones    Lung cancer ([place])    Wears glasses     Patient's surgical history, family medical history, social history, medications and allergies were all reviewed in Epic    No current facility-administered medications for this encounter.    Physical Exam:     Ht 6' (1.829 m)   Wt 99.8 kg   BMI 29.84 kg/m   GENERAL:  Pleasant male in NAD PSYCH: : Cooperative, normal affect EENT:  conjunctiva pink, mucous membranes moist, neck supple without masses CARDIAC:  RRR, no murmur heard, no peripheral edema PULM: Normal respiratory effort, lungs CTA bilaterally, no wheezing ABDOMEN:  Nondistended, soft, nontender. No obvious masses, no hepatomegaly,  normal bowel sounds SKIN:  turgor, no lesions seen Musculoskeletal:  Normal muscle tone, normal strength NEURO: Alert and oriented x 3, no focal neurologic deficits   IMPRESSION and PLAN:    1) GERD with erosive esophagitis 2) Esophageal stricture 3) History of radiation 4) Hiatal hernia - EGD today to evaluate for appropriate mucosal healing of severe esophagitis - Repeat dilation if recurrence of stricture - Interestingly, while the stricture was focal and <1 cm in length, biopsies favor radiation-induced injury rather than peptic stricture  5) Chronic cough 6) Non-small cell lung cancer - Continues to follow with Pulmonary, Oncology, and Radiation Oncology - EGD scheduled at [facility] today due to  elevated periprocedural risks related to underlying comorbidities          Lavena Bullion ,DO, FACG 06/11/2021, 8:26 AM

## 2021-06-11 NOTE — Interval H&P Note (Signed)
History and Physical Interval Note:  06/11/2021 8:31 AM  William Farrell  has presented today for surgery, with the diagnosis of Esophageal stricture, GERD.  The various methods of treatment have been discussed with the patient and family. After consideration of risks, benefits and other options for treatment, the patient has consented to  Procedure(s) with comments: ESOPHAGOGASTRODUODENOSCOPY (EGD) WITH PROPOFOL (N/A) - with dilitation as a surgical intervention.  The patient's history has been reviewed, patient examined, no change in status, stable for surgery.  I have reviewed the patient's chart and labs.  Questions were answered to the patient's satisfaction.     Dominic Pea [name]

## 2021-06-11 NOTE — Discharge Instructions (Signed)

## 2021-06-12 ENCOUNTER — Other Ambulatory Visit: Payer: Self-pay | Admitting: Internal Medicine

## 2021-06-12 ENCOUNTER — Other Ambulatory Visit: Payer: Self-pay | Admitting: Osteopathic Medicine

## 2021-06-12 ENCOUNTER — Encounter (HOSPITAL_COMMUNITY): Payer: Self-pay | Admitting: Gastroenterology

## 2021-06-12 ENCOUNTER — Telehealth: Payer: Self-pay | Admitting: Pulmonary Disease

## 2021-06-12 DIAGNOSIS — R112 Nausea with vomiting, unspecified: Secondary | ICD-10-CM

## 2021-06-12 LAB — SURGICAL PATHOLOGY

## 2021-06-12 MED ORDER — ALBUTEROL SULFATE (2.5 MG/3ML) 0.083% IN NEBU: 2.5000 mg | INHALATION_SOLUTION | RESPIRATORY_TRACT | 5 refills | Status: DC | PRN

## 2021-06-12 NOTE — Telephone Encounter (Signed)
Call made to patient, confirmed DOB. Made aware script has been sent to [facility]. Voiced understanding.   Nothing further needed at this time.

## 2021-06-16 ENCOUNTER — Telehealth: Payer: Self-pay

## 2021-06-16 ENCOUNTER — Encounter: Payer: Self-pay | Admitting: Gastroenterology

## 2021-06-16 NOTE — Telephone Encounter (Signed)
Notified [name] of completion of Accommodation Request. Fax transmission confirmation received and copy of paper work placed at registration desk for pick up as requested.

## 2021-06-17 ENCOUNTER — Inpatient Hospital Stay: Payer: BC Managed Care – PPO

## 2021-06-17 ENCOUNTER — Other Ambulatory Visit: Payer: Self-pay

## 2021-06-17 ENCOUNTER — Telehealth: Payer: Self-pay | Admitting: Pulmonary Disease

## 2021-06-17 ENCOUNTER — Inpatient Hospital Stay: Payer: BC Managed Care – PPO | Attending: Internal Medicine | Admitting: Internal Medicine

## 2021-06-17 VITALS — BP 136/78 | HR 120 | Temp 96.1°F | Resp 20 | Ht 72.0 in | Wt 216.1 lb

## 2021-06-17 DIAGNOSIS — C3411 Malignant neoplasm of upper lobe, right bronchus or lung: Secondary | ICD-10-CM

## 2021-06-17 DIAGNOSIS — Z7984 Long term (current) use of oral hypoglycemic drugs: Secondary | ICD-10-CM | POA: Insufficient documentation

## 2021-06-17 DIAGNOSIS — Z5111 Encounter for antineoplastic chemotherapy: Secondary | ICD-10-CM | POA: Insufficient documentation

## 2021-06-17 DIAGNOSIS — Z5189 Encounter for other specified aftercare: Secondary | ICD-10-CM | POA: Diagnosis not present

## 2021-06-17 DIAGNOSIS — Z79899 Other long term (current) drug therapy: Secondary | ICD-10-CM | POA: Diagnosis not present

## 2021-06-17 DIAGNOSIS — Z5112 Encounter for antineoplastic immunotherapy: Secondary | ICD-10-CM | POA: Insufficient documentation

## 2021-06-17 DIAGNOSIS — E119 Type 2 diabetes mellitus without complications: Secondary | ICD-10-CM | POA: Insufficient documentation

## 2021-06-17 DIAGNOSIS — Z95828 Presence of other vascular implants and grafts: Secondary | ICD-10-CM

## 2021-06-17 LAB — CBC WITH DIFFERENTIAL (CANCER CENTER ONLY)
Abs Immature Granulocytes: 0.2 10*3/uL — ABNORMAL HIGH (ref 0.00–0.07)
Basophils Absolute: 0.1 10*3/uL (ref 0.0–0.1)
Basophils Relative: 0 %
Eosinophils Absolute: 0 10*3/uL (ref 0.0–0.5)
Eosinophils Relative: 0 %
HCT: 29.3 % — ABNORMAL LOW (ref 39.0–52.0)
Hemoglobin: 9.5 g/dL — ABNORMAL LOW (ref 13.0–17.0)
Immature Granulocytes: 1 %
Lymphocytes Relative: 4 %
Lymphs Abs: 1 10*3/uL (ref 0.7–4.0)
MCH: 29.5 pg (ref 26.0–34.0)
MCHC: 32.4 g/dL (ref 30.0–36.0)
MCV: 91 fL (ref 80.0–100.0)
Monocytes Absolute: 1.3 10*3/uL — ABNORMAL HIGH (ref 0.1–1.0)
Monocytes Relative: 5 %
Neutro Abs: 24.3 10*3/uL — ABNORMAL HIGH (ref 1.7–7.7)
Neutrophils Relative %: 90 %
Platelet Count: 220 10*3/uL (ref 150–400)
RBC: 3.22 MIL/uL — ABNORMAL LOW (ref 4.22–5.81)
RDW: 18.5 % — ABNORMAL HIGH (ref 11.5–15.5)
WBC Count: 26.8 10*3/uL — ABNORMAL HIGH (ref 4.0–10.5)
nRBC: 0.1 % (ref 0.0–0.2)

## 2021-06-17 LAB — CMP (CANCER CENTER ONLY)
ALT: 10 U/L (ref 0–44)
AST: 10 U/L — ABNORMAL LOW (ref 15–41)
Albumin: 2.9 g/dL — ABNORMAL LOW (ref 3.5–5.0)
Alkaline Phosphatase: 134 U/L — ABNORMAL HIGH (ref 38–126)
Anion gap: 13 (ref 5–15)
BUN: 11 mg/dL (ref 6–20)
CO2: 22 mmol/L (ref 22–32)
Calcium: 9.6 mg/dL (ref 8.9–10.3)
Chloride: 103 mmol/L (ref 98–111)
Creatinine: 1.28 mg/dL — ABNORMAL HIGH (ref 0.61–1.24)
GFR, Estimated: >60 mL/min (ref >60)
Glucose, Bld: 209 mg/dL — ABNORMAL HIGH (ref 70–99)
Potassium: 4.4 mmol/L (ref 3.5–5.1)
Sodium: 138 mmol/L (ref 135–145)
Total Bilirubin: 0.4 mg/dL (ref 0.3–1.2)
Total Protein: 6.7 g/dL (ref 6.5–8.1)

## 2021-06-17 LAB — TOTAL PROTEIN, URINE DIPSTICK: Protein, ur: <30 mg/dL

## 2021-06-17 MED ORDER — ONDANSETRON HCL 4 MG/2ML IJ SOLN
8.0000 mg | Freq: Once | INTRAMUSCULAR | Status: AC
  Administered 2021-06-17: 8 mg via INTRAVENOUS

## 2021-06-17 MED ORDER — SODIUM CHLORIDE 0.9 % IV SOLN
10.0000 mg | Freq: Once | INTRAVENOUS | Status: AC
  Administered 2021-06-17: 10 mg via INTRAVENOUS
  Filled 2021-06-17: qty 10

## 2021-06-17 MED ORDER — SODIUM CHLORIDE 0.9 % IV SOLN
Freq: Once | INTRAVENOUS | Status: AC
  Filled 2021-06-17: qty 250

## 2021-06-17 MED ORDER — SODIUM CHLORIDE 0.9 % IV SOLN
1000.0000 mg | Freq: Once | INTRAVENOUS | Status: AC
  Administered 2021-06-17: 1000 mg via INTRAVENOUS
  Filled 2021-06-17: qty 100

## 2021-06-17 MED ORDER — ONDANSETRON HCL 4 MG/2ML IJ SOLN
INTRAMUSCULAR | Status: AC
  Filled 2021-06-17: qty 4

## 2021-06-17 MED ORDER — ACETAMINOPHEN 325 MG PO TABS
650.0000 mg | ORAL_TABLET | Freq: Once | ORAL | Status: AC
  Administered 2021-06-17: 650 mg via ORAL

## 2021-06-17 MED ORDER — DIPHENHYDRAMINE HCL 50 MG/ML IJ SOLN
INTRAMUSCULAR | Status: AC
  Filled 2021-06-17: qty 1

## 2021-06-17 MED ORDER — DIPHENHYDRAMINE HCL 50 MG/ML IJ SOLN
50.0000 mg | Freq: Once | INTRAMUSCULAR | Status: AC
  Administered 2021-06-17: 50 mg via INTRAVENOUS

## 2021-06-17 MED ORDER — ALBUTEROL SULFATE (2.5 MG/3ML) 0.083% IN NEBU: 2.5000 mg | INHALATION_SOLUTION | RESPIRATORY_TRACT | 5 refills | Status: DC | PRN

## 2021-06-17 MED ORDER — SODIUM CHLORIDE 0.9% FLUSH
10.0000 mL | INTRAVENOUS | Status: DC | PRN
  Administered 2021-06-17: 10 mL
  Filled 2021-06-17: qty 10

## 2021-06-17 MED ORDER — SODIUM CHLORIDE 0.9 % IV SOLN
140.0000 mg | Freq: Once | INTRAVENOUS | Status: AC
  Administered 2021-06-17: 140 mg via INTRAVENOUS
  Filled 2021-06-17: qty 14

## 2021-06-17 MED ORDER — ACETAMINOPHEN 325 MG PO TABS
ORAL_TABLET | ORAL | Status: AC
  Filled 2021-06-17: qty 2

## 2021-06-17 MED ORDER — HEPARIN SOD (PORK) LOCK FLUSH 100 UNIT/ML IV SOLN
500.0000 [IU] | Freq: Once | INTRAVENOUS | Status: AC | PRN
  Administered 2021-06-17: 500 [IU]
  Filled 2021-06-17: qty 5

## 2021-06-17 MED ORDER — SODIUM CHLORIDE 0.9 % IV SOLN
INTRAVENOUS | Status: DC
  Filled 2021-06-17 (×2): qty 250

## 2021-06-17 MED ORDER — SODIUM CHLORIDE 0.9% FLUSH
10.0000 mL | Freq: Once | INTRAVENOUS | Status: AC
  Administered 2021-06-17: 10 mL
  Filled 2021-06-17: qty 10

## 2021-06-17 NOTE — Progress Notes (Signed)
[place] [phone]   [phone]  OFFICE PROGRESS NOTE  [name], DO [address] [place] [address]  DIAGNOSIS: Recurrent/progressive non-small cell lung cancer initially diagnosed as stage IIIA (T2a, N2, M0) non-small cell lung cancer, squamous cell carcinoma diagnosed in April 2021, presented with right upper lobe/suprahilar lung mass in addition to right paratracheal and subcarinal lymphadenopathy.  Molecular Studies by [facility]: No actionable mutations   PRIOR THERAPY:  1) Concurrent chemoradiation with weekly carboplatin for AUC of 2 and paclitaxel 45 NG/M2.  First dose on Mar 31, 2020.  Status post 7 cycles.  Last dose was given on 05/12/2020 with partial response. 2) Consolidation immunotherapy with durvalumab 1500 mg IV every 4 weeks.  First dose May 12, 2020. Status post 3 cycles.  3) Systemic chemotherapy with carboplatin for an AUC of 5, paclitaxel 175 mg/m2, and Keytruda 200 mg IV every 3 weeks. First dose expected on 10/02/20.   Status post 3 cycles.  Last dose was given on November 13, 2020. 4) SBRT to the enlarging right upper lobe lung nodules under the care of Dr. Lisbeth Renshaw.  CURRENT THERAPY:  Second line systemic chemotherapy with docetaxel 75 Mg/M2 and Cyramza 10 mg/KG every 3 weeks with Neulasta support.  First dose December 11, 2020.  Status post 9 cycles.  Starting from cycle #10 I will reduce his dose of docetaxel to 65 Mg/M2 because of fatigue and intolerance.   INTERVAL HISTORY: William Farrell 60 y.o. male returns to the clinic today for follow-up visit.  The patient was accompanied by his wife.  He continues to complain of increasing fatigue and weakness as well as intermittent difficulty with swallowing.  He was seen recently by gastroenterology and has dilatation of his esophagus.  Has a swallowing issue is intermittent and sometimes he is able to eat whatever he wants and sometimes he cannot  chew and swallow even bread.  He is currently on Marinol and has good appetite.  He has no oral thrush.  He had 2 days of diarrhea after his last treatment and he took Imodium and felt better.  He was dehydrated a week ago when he [facility]  He received IV hydration at that time and felt better.  He also continues to complain of shortness of breath with exertion but no significant chest pain, cough or hemoptysis.  He denied having any fever or chills.  He has no current nausea, vomiting, diarrhea or constipation.  He has no headache or visual changes.  He had repeat CT scan of the chest, abdomen pelvis performed recently and he is here for evaluation and discussion of his scan results.  MEDICAL HISTORY: Past Medical History:  Diagnosis Date   Asthma    as a child   Diabetes ([place])    Diverticulitis    GERD (gastroesophageal reflux disease)    High cholesterol    History of kidney stones    Lung cancer (H[facility])    Wears glasses     ALLERGIES:  is allergic to sitagliptin.  MEDICATIONS:  Current Outpatient Medications  Medication Sig Dispense Refill   dronabinol (MARINOL) 2.5 MG capsule Take 1 capsule (2.5 mg total) by mouth 2 (two) times daily before lunch and supper. 60 capsule 0   Ferrous Fumarate-Vitamin C ER 65-25 MG TBCR Take 65 mg by mouth daily.     glipiZIDE (GLUCOTROL) 5 MG tablet Take 1 tablet (  5 mg total) by mouth 2 (two) times daily before a meal. 180 tablet 3   Lancets (ONETOUCH DELICA PLUS [id number]) MISC USE AS DIRECTED UP TO FOUR TIMES A DAY 100 each 13   levothyroxine (SYNTHROID) 75 MCG tablet TAKE 1 TABLET BY MOUTH EVERY DAY BEFORE BREAKFAST (Patient taking differently: Take 75 mcg by mouth daily before breakfast.) 30 tablet 1   lidocaine-prilocaine (EMLA) cream Apply 1 application topically as needed. (Patient taking differently: Apply 1 application topically as needed (port access).) 30 g 2   loratadine (CLARITIN) 10 MG tablet Take 10  mg by mouth daily as needed for allergies (after chemo shot Fri, sat. sun.).     meloxicam (MOBIC) 15 MG tablet TAKE 1 TABLET DAILY (Patient taking differently: Take 15 mg by mouth daily.) 90 tablet 3   metFORMIN (GLUCOPHAGE) 1000 MG tablet TAKE 1 TABLET TWICE A DAY WITH MEALS 180 tablet 1   ONETOUCH ULTRA test strip USE AS INSTRUCTED UP TO FOUR TIMES A DAY 100 strip 13   oxyCODONE-acetaminophen (PERCOCET/ROXICET) 5-325 MG tablet Take 1 tablet by mouth every 8 (eight) hours as needed for severe pain. (Patient taking differently: Take 1 tablet by mouth at bedtime.) 30 tablet 0   pantoprazole (PROTONIX) 40 MG tablet Take 1 tablet (40 mg total) by mouth daily. 90 tablet 3   Semaglutide,0.25 or 0.5MG /DOS, (OZEMPIC, 0.25 OR 0.5 MG/DOSE,) 2 MG/1.5ML SOPN Inject 0.5 mg into the skin once a week. 7.5 mL 1   Tiotropium Bromide-Olodaterol (STIOLTO RESPIMAT) 2.5-2.5 MCG/ACT AERS Inhale 2 puffs into the lungs daily. 3 each 3   acetaminophen (TYLENOL) 500 MG tablet Take 1,000 mg by mouth every 6 (six) hours as needed for mild pain.     albuterol (PROVENTIL) (2.5 MG/3ML) 0.083% nebulizer solution Take 3 mLs (2.5 mg total) by nebulization every 4 (four) hours as needed for wheezing or shortness of breath (EVERY 4 HOURS AND PRN). 75 mL 5   albuterol (VENTOLIN HFA) 108 (90 Base) MCG/ACT inhaler Inhale 1-2 puffs into the lungs every 4 (four) hours as needed for wheezing or shortness of breath. 18 g 3   aspirin EC 81 MG tablet Take 81 mg by mouth at bedtime.      atorvastatin (LIPITOR) 40 MG tablet TAKE 1 TABLET DAILY (Patient taking differently: Take 40 mg by mouth at bedtime.) 90 tablet 3   benzonatate (TESSALON) 200 MG capsule TAKE 1 CAPSULE (200 MG TOTAL) BY MOUTH 3 (THREE) TIMES DAILY AS NEEDED FOR COUGH. 90 capsule 1   Budeson-Glycopyrrol-Formoterol (BREZTRI AEROSPHERE) 160-9-4.8 MCG/ACT AERO Inhale 2 puffs into the lungs in the morning and at bedtime. 10.7 g 0   ondansetron (ZOFRAN) 8 MG tablet TAKE 1 TABLET BY  MOUTH EVERY 8 HOURS AS NEEDED FOR NAUSEA AND VOMITING (Patient not taking: Reported on 06/17/2021) 9 tablet 2   temazepam (RESTORIL) 15 MG capsule Take 1 capsule (15 mg total) by mouth at bedtime as needed for sleep. (Patient not taking: Reported on 06/17/2021) 30 capsule 0   No current facility-administered medications for this visit.    SURGICAL HISTORY:  Past Surgical History:  Procedure Laterality Date   BIOPSY  03/25/2021   Procedure: BIOPSY;  Surgeon: Lavena Bullion, DO;  Location: [facility];  Service: Gastroenterology;;   BIOPSY  06/11/2021   Procedure: BIOPSY;  Surgeon: Lavena Bullion, DO;  Location: [facility];  Service: Gastroenterology;;   broken bone repair     BRONCHIAL BIOPSY  03/11/2020   Procedure: BRONCHIAL BIOPSIES;  Surgeon:  Garner Nash, DO;  Location: [facility];  Service: Pulmonary;;   COLONOSCOPY     around 44[place]    CRYOTHERAPY  03/11/2020   Procedure: CRYOTHERAPY;  Surgeon: Garner Nash, DO;  Location: [facility];  Service: Pulmonary;;   ESOPHAGEAL DILATION  03/25/2021   Procedure: ESOPHAGEAL DILATION;  Surgeon: Lavena Bullion, DO;  Location: [facility];  Service: Gastroenterology;;   ESOPHAGOGASTRODUODENOSCOPY (EGD) WITH PROPOFOL N/A 03/25/2021   Procedure: ESOPHAGOGASTRODUODENOSCOPY (EGD) WITH PROPOFOL;  Surgeon: Lavena Bullion, DO;  Location: [facility];  Service: Gastroenterology;  Laterality: N/A;  dialation of stricture   ESOPHAGOGASTRODUODENOSCOPY (EGD) WITH PROPOFOL N/A 06/11/2021   Procedure: ESOPHAGOGASTRODUODENOSCOPY (EGD) WITH PROPOFOL;  Surgeon: Lavena Bullion, DO;  Location: [facility];  Service: Gastroenterology;  Laterality: N/A;   FINE NEEDLE ASPIRATION  03/11/2020   Procedure: FINE NEEDLE ASPIRATION (FNA) LINEAR;  Surgeon: Garner Nash, DO;  Location: [facility];  Service: Pulmonary;;   IR IMAGING GUIDED PORT INSERTION  10/16/2020   KNEE ARTHROSCOPY WITH ANTERIOR CRUCIATE LIGAMENT (ACL) REPAIR     x  2   VIDEO BRONCHOSCOPY WITH ENDOBRONCHIAL ULTRASOUND N/A 03/11/2020   Procedure: VIDEO BRONCHOSCOPY WITH ENDOBRONCHIAL ULTRASOUND;  Surgeon: Garner Nash, DO;  Location: [facility];  Service: Pulmonary;  Laterality: N/A;    REVIEW OF SYSTEMS:  Constitutional: positive for anorexia, fatigue, and weight loss Eyes: negative Ears, nose, mouth, throat, and face: negative Respiratory: positive for dyspnea on exertion Cardiovascular: negative Gastrointestinal: positive for dysphagia Genitourinary:negative Integument/breast: negative for rash Hematologic/lymphatic: negative Musculoskeletal:negative Neurological: negative Behavioral/Psych: negative Endocrine: negative Allergic/Immunologic: negative   PHYSICAL EXAMINATION: General appearance: alert, cooperative, fatigued, and no distress Head: Normocephalic, without obvious abnormality, atraumatic Neck: no adenopathy, no JVD, supple, symmetrical, trachea midline, and thyroid not enlarged, symmetric, no tenderness/mass/nodules Lymph nodes: Cervical, supraclavicular, and axillary nodes normal. Resp: clear to auscultation bilaterally Back: symmetric, no curvature. ROM normal. No CVA tenderness. Cardio: regular rate and rhythm, S1, S2 normal, no murmur, click, rub or gallop GI: soft, non-tender; bowel sounds normal; no masses,  no organomegaly Extremities: extremities normal, atraumatic, no cyanosis or edema Neurologic: Alert and oriented X 3, normal strength and tone. Normal symmetric reflexes. Normal coordination and gait  ECOG PERFORMANCE STATUS: 1 - Symptomatic but completely ambulatory  Blood pressure 136/78, pulse (!) 120, temperature (!) 96.1 F (35.6 C), temperature source Tympanic, resp. rate 20, height 6' (1.829 m), weight 216 lb 1.6 oz (98 kg), SpO2 97 %.  LABORATORY DATA: Lab Results  Component Value Date   WBC 26.8 (H) 06/17/2021   HGB 9.5 (L) 06/17/2021   HCT 29.3 (L) 06/17/2021   MCV 91.0 06/17/2021   PLT 220  06/17/2021      Chemistry      Component Value Date/Time   NA 138 06/10/2021 0947   K 5.1 06/10/2021 0947   CL 101 06/10/2021 0947   CO2 26 06/10/2021 0947   BUN 12 06/10/2021 0947   CREATININE 1.76 (H) 06/10/2021 0947   CREATININE 1.10 01/14/2020 0859      Component Value Date/Time   CALCIUM 9.8 06/10/2021 0947   ALKPHOS 156 (H) 06/10/2021 0947   AST 15 06/10/2021 0947   ALT 11 06/10/2021 0947   BILITOT 0.3 06/10/2021 0947       RADIOGRAPHIC STUDIES: CT Chest W Contrast  Result Date: 06/09/2021 CLINICAL DATA:  Primary Cancer Type: Lung Imaging Indication: Assess response to therapy Interval therapy since last imaging? Yes Initial Cancer Diagnosis Date: 03/11/2020; Established by: Biopsy-proven  Detailed Pathology: Stage IIIA non-small cell lung cancer, squamous cell carcinoma. Primary Tumor location: Right upper lobe. Surgeries: Esophageal dilation 03/25/2021. Chemotherapy: Yes; Ongoing? Yes; Most recent administration: 05/28/2021 Immunotherapy?  Yes; Type: Keytruda; Ongoing? No Radiation therapy? Yes Date Range: 03/17/2021 - 03/24/2021; Target: Right upper lung. Date Range: 03/31/2020 - 05/15/2020; Target: Right lung. EXAM: CT CHEST, ABDOMEN, AND PELVIS WITH CONTRAST TECHNIQUE: Multidetector CT imaging of the chest, abdomen and pelvis was performed following the standard protocol during bolus administration of intravenous contrast. CONTRAST:  169mL OMNIPAQUE IOHEXOL 300 MG/ML  SOLN COMPARISON:  Most recent CT chest, abdomen and pelvis 04/20/2021. 09/16/2020 PET-CT. FINDINGS: CT CHEST FINDINGS Cardiovascular: Left Port-A-Cath tip at high right atrium. Aortic atherosclerosis. Normal heart size, without pericardial effusion. No central pulmonary embolism, on this non-dedicated study. Mediastinum/Nodes: Right supraclavicular node measures 2.0 cm on 05/02 versus 1.6 cm on the prior. Right paratracheal node measures 10 mm on 21/2 and is not significantly changed. Subcarinal node measures 1.2 cm  on 29/2 and is similar to on the prior. No hilar adenopathy. Lungs/Pleura: Small right pleural effusion is not significantly changed. Right apical lung nodule measures 2.4 x 1.4 cm on 36/3 (Previously 2.5 x 1.7 cm.) Demonstrates new or increased cavitation. Right suprahilar upper lobe lung mass measures 4.1 x 3.5 cm on 54/3 (Previously 4.2 x 3.3 cm.) Right apical and upper lobe interstitial thickening is grossly similar and likely radiation induced. Right medial lower lobe consolidation and volume loss are also likely radiation induced. New 3 mm right apical pulmonary nodule on 42/3. New 3 mm right upper lobe pulmonary nodule on 44/3. Similar left apical nodularity, including at up to 8 mm on 21/3. This is again favored to represent scarring. 3 mm left lower lobe pulmonary nodule on 101/3, new. 2-3 mm pulmonary nodules x2 more inferiorly in the left lower lobe on images 116 and 117 of series 3, new. Musculoskeletal: No acute osseous abnormality. CT ABDOMEN PELVIS FINDINGS Hepatobiliary: Normal liver. Normal gallbladder, without biliary ductal dilatation. Pancreas: Normal, without mass or ductal dilatation. Spleen: Normal in size, without focal abnormality. Adrenals/Urinary Tract: Normal adrenal glands. Normal kidneys, without hydronephrosis. Normal urinary bladder. Stomach/Bowel: Normal stomach, without wall thickening. Scattered colonic diverticula. Normal terminal ileum and appendix. Normal small bowel. Vascular/Lymphatic: Aortic atherosclerosis. Celiac ectasia is mild similar. No abdominopelvic adenopathy. Reproductive: Normal prostate. Other: Small bilateral fat containing inguinal hernias. No significant free fluid. No evidence of omental or peritoneal disease. Musculoskeletal: No acute osseous abnormality. IMPRESSION: 1. Right apical and suprahilar lung lesions are similar in size. 2. Development of tiny bilateral pulmonary nodules, indeterminate but moderately suspicious for metastasis. 3. Enlargement of a  right supraclavicular nodal metastasis. Other thoracic nodes are unchanged in size. 4. No findings of extrathoracic metastatic disease. 5. Similar small right pleural effusion. 6.  Aortic Atherosclerosis (ICD10-I70.0). Electronically Signed   By: Abigail Miyamoto M.D.   On: 06/09/2021 11:20   CT Abdomen Pelvis W Contrast  Result Date: 06/09/2021 CLINICAL DATA:  Primary Cancer Type: Lung Imaging Indication: Assess response to therapy Interval therapy since last imaging? Yes Initial Cancer Diagnosis Date: 03/11/2020; Established by: Biopsy-proven Detailed Pathology: Stage IIIA non-small cell lung cancer, squamous cell carcinoma. Primary Tumor location: Right upper lobe. Surgeries: Esophageal dilation 03/25/2021. Chemotherapy: Yes; Ongoing? Yes; Most recent administration: 05/28/2021 Immunotherapy?  Yes; Type: Keytruda; Ongoing? No Radiation therapy? Yes Date Range: 03/17/2021 - 03/24/2021; Target: Right upper lung. Date Range: 03/31/2020 - 05/15/2020; Target: Right lung. EXAM: CT CHEST, ABDOMEN, AND PELVIS WITH CONTRAST TECHNIQUE: Multidetector  CT imaging of the chest, abdomen and pelvis was performed following the standard protocol during bolus administration of intravenous contrast. CONTRAST:  129mL OMNIPAQUE IOHEXOL 300 MG/ML  SOLN COMPARISON:  Most recent CT chest, abdomen and pelvis 04/20/2021. 09/16/2020 PET-CT. FINDINGS: CT CHEST FINDINGS Cardiovascular: Left Port-A-Cath tip at high right atrium. Aortic atherosclerosis. Normal heart size, without pericardial effusion. No central pulmonary embolism, on this non-dedicated study. Mediastinum/Nodes: Right supraclavicular node measures 2.0 cm on 05/02 versus 1.6 cm on the prior. Right paratracheal node measures 10 mm on 21/2 and is not significantly changed. Subcarinal node measures 1.2 cm on 29/2 and is similar to on the prior. No hilar adenopathy. Lungs/Pleura: Small right pleural effusion is not significantly changed. Right apical lung nodule measures 2.4 x 1.4  cm on 36/3 (Previously 2.5 x 1.7 cm.) Demonstrates new or increased cavitation. Right suprahilar upper lobe lung mass measures 4.1 x 3.5 cm on 54/3 (Previously 4.2 x 3.3 cm.) Right apical and upper lobe interstitial thickening is grossly similar and likely radiation induced. Right medial lower lobe consolidation and volume loss are also likely radiation induced. New 3 mm right apical pulmonary nodule on 42/3. New 3 mm right upper lobe pulmonary nodule on 44/3. Similar left apical nodularity, including at up to 8 mm on 21/3. This is again favored to represent scarring. 3 mm left lower lobe pulmonary nodule on 101/3, new. 2-3 mm pulmonary nodules x2 more inferiorly in the left lower lobe on images 116 and 117 of series 3, new. Musculoskeletal: No acute osseous abnormality. CT ABDOMEN PELVIS FINDINGS Hepatobiliary: Normal liver. Normal gallbladder, without biliary ductal dilatation. Pancreas: Normal, without mass or ductal dilatation. Spleen: Normal in size, without focal abnormality. Adrenals/Urinary Tract: Normal adrenal glands. Normal kidneys, without hydronephrosis. Normal urinary bladder. Stomach/Bowel: Normal stomach, without wall thickening. Scattered colonic diverticula. Normal terminal ileum and appendix. Normal small bowel. Vascular/Lymphatic: Aortic atherosclerosis. Celiac ectasia is mild similar. No abdominopelvic adenopathy. Reproductive: Normal prostate. Other: Small bilateral fat containing inguinal hernias. No significant free fluid. No evidence of omental or peritoneal disease. Musculoskeletal: No acute osseous abnormality. IMPRESSION: 1. Right apical and suprahilar lung lesions are similar in size. 2. Development of tiny bilateral pulmonary nodules, indeterminate but moderately suspicious for metastasis. 3. Enlargement of a right supraclavicular nodal metastasis. Other thoracic nodes are unchanged in size. 4. No findings of extrathoracic metastatic disease. 5. Similar small right pleural effusion. 6.   Aortic Atherosclerosis (ICD10-I70.0). Electronically Signed   By: Abigail Miyamoto M.D.   On: 06/09/2021 11:20    ASSESSMENT AND PLAN: This is a 60 years old white male was recently diagnosed with stage IIIa non-small cell lung cancer, squamous cell carcinoma in April 2021 presented with right upper lobe/suprahilar lung mass in addition to right paratracheal and subcarinal lymphadenopathy. The patient completed a course of concurrent chemoradiation with weekly carboplatin for AUC of 2 and paclitaxel 45 MG/M2.  He is status post 7 cycles.  He tolerated the previous course of his treatment well except for mild dysphagia and odynophagia. He underwent consolidation treatment with immunotherapy with Imfinzi 1500 mg IV every 4 weeks.  Status post 3 cycles.  The patient tolerated the treatment well but unfortunately he has evidence for disease progression after cycle #3. He is started first-line treatment with chemotherapy with carboplatin for AUC of 5, paclitaxel 175 mg/M2 and Keytruda 200 mg IV every 3 weeks.  Status post 3 cycles.  He has been tolerating this treatment well with no concerning adverse effects except for mild fatigue.  The patient had repeat CT scan of the chest, abdomen pelvis performed recently.  I personally and independently reviewed the scan images and discussed the result and showed the images to the patient today. Unfortunately his scan showed enlarging nodules and masses in the chest with enlarging lymph nodes.  This is definitely highly suspicious for disease progression but to the progression on immunotherapy could not be completely excluded. He is currently undergoing palliative systemic chemotherapy with second line docetaxel 75 Mg/M2 and Cyramza 10 mg/KG every 3 weeks with Neulasta support.  Status post 9 cycles.   He also underwent palliative radiotherapy to the enlarging right upper lobe lung mass under the care of Dr. Lisbeth Renshaw. He continues to tolerate his treatment with docetaxel and  Cyramza except for the fatigue and intermittent dysphagia and weight loss. He had repeat CT scan of the chest, abdomen pelvis performed recently.  I personally and independently reviewed the scans and discussed the results with the patient today. His scan showed a stable disease except for small tiny bilateral pulmonary nodule that could be suspicious for metastasis.  He also has enlargement of a right supraclavicular nodal metastasis increased from 1.6 to 2.0 cm. I recommended for the patient to continue his current treatment with docetaxel and Cyramza today but I will reduce the dose of docetaxel to 65 Mg/M2 starting from cycle #10 because of the fatigue and weakness.  I will continue to monitor the right supraclavicular lymph node as well as the tiny pulmonary nodules closely on the upcoming imaging studies and if there is any further progression, we will consider changing his treatment plan. For the lack of appetite and weight loss, he will continue his current treatment with Marinol 2.5 mg p.o. twice daily. For pain management the patient will continue his current treatment with Percocet. For the insomnia he will continue his current treatment with Restoril 15 mg p.o. nightly. The patient will come back for follow-up visit in 3 weeks for evaluation before the next cycle of his treatment. He was advised to call immediately if he has any concerning symptoms in the interval or any significant diarrhea and dehydration.  The patient voices understanding of current disease status and treatment options and is in agreement with the current care plan. All questions were answered. The patient knows to call the clinic with any problems, questions or concerns. We can certainly see the patient much sooner if necessary.  Disclaimer: This note was dictated with voice recognition software. Similar sounding words can inadvertently be transcribed and may not be corrected upon review.

## 2021-06-17 NOTE — Telephone Encounter (Signed)
I have called and spoke with [name] and she is aware that the rx for the proventil has been sent to the local pharmacy.  Pt has regular insurance and [facility] is not able to fill this medication for him.  Nothing further is needed.

## 2021-06-17 NOTE — Progress Notes (Signed)
Per telephone conversation between [facility] pharmacist and Seth Bake - [employer] Nurse, "Carolann Littler stated it is "OK To Give Docetaxel first" d/t clarification needed in dosing for Ramucirumab today.

## 2021-06-17 NOTE — Progress Notes (Signed)
Per Dr. Julien Nordmann, "OK To Treat with an elevated HR".

## 2021-06-17 NOTE — Patient Instructions (Signed)
[facility]  Discharge Instructions: Thank you for choosing [facility] to provide your oncology and hematology care.   If you have a lab appointment with the [facility], please go directly to the [facility] and check in at the registration area.   Wear comfortable clothing and clothing appropriate for easy access to any Portacath or PICC line.   We strive to give you quality time with your provider. You may need to reschedule your appointment if you arrive late (15 or more minutes).  Arriving late affects you and other patients whose appointments are after yours.  Also, if you miss three or more appointments without notifying the office, you may be dismissed from the clinic at the provider's discretion.      For prescription refill requests, have your pharmacy contact our office and allow 72 hours for refills to be completed.    Today you received the following chemotherapy and/or immunotherapy agents Ramucirumab and Doxetaxel      To help prevent nausea and vomiting after your treatment, we encourage you to take your nausea medication as directed.  BELOW ARE SYMPTOMS THAT SHOULD BE REPORTED IMMEDIATELY: *FEVER GREATER THAN 100.4 F (38 C) OR HIGHER *CHILLS OR SWEATING *NAUSEA AND VOMITING THAT IS NOT CONTROLLED WITH YOUR NAUSEA MEDICATION *UNUSUAL SHORTNESS OF BREATH *UNUSUAL BRUISING OR BLEEDING *URINARY PROBLEMS (pain or burning when urinating, or frequent urination) *BOWEL PROBLEMS (unusual diarrhea, constipation, pain near the anus) TENDERNESS IN MOUTH AND THROAT WITH OR WITHOUT PRESENCE OF ULCERS (sore throat, sores in mouth, or a toothache) UNUSUAL RASH, SWELLING OR PAIN  UNUSUAL VAGINAL DISCHARGE OR ITCHING   Items with * indicate a potential emergency and should be followed up as soon as possible or go to the Emergency Department if any problems should occur.  Please show the CHEMOTHERAPY ALERT CARD or IMMUNOTHERAPY ALERT CARD  at check-in to the Emergency Department and triage nurse.  Should you have questions after your visit or need to cancel or reschedule your appointment, please contact [facility]  Dept: [phone]  and follow the prompts.  Office hours are 8:00 a.m. to 4:30 p.m. Monday - Friday. Please note that voicemails left after 4:00 p.m. may not be returned until the following business day.  We are closed weekends and major holidays. You have access to a nurse at all times for urgent questions. Please call the main number to the clinic Dept: [phone] and follow the prompts.   For any non-urgent questions, you may also contact your provider using MyChart. We now offer e-Visits for anyone 49 and older to request care online for non-urgent symptoms. For details visit [email or web address].   Also download the MyChart app! Go to the app store, search "MyChart", open the app, select [facility], and log in with your MyChart username and password.  Due to Covid, a mask is required upon entering the hospital/clinic. If you do not have a mask, one will be given to you upon arrival. For doctor visits, patients may have 1 support person aged 65 or older with them. For treatment visits, patients cannot have anyone with them due to current Covid guidelines and our immunocompromised population.

## 2021-06-18 ENCOUNTER — Inpatient Hospital Stay: Payer: BC Managed Care – PPO

## 2021-06-18 VITALS — BP 120/74 | HR 110 | Temp 98.2°F

## 2021-06-18 DIAGNOSIS — Z5112 Encounter for antineoplastic immunotherapy: Secondary | ICD-10-CM | POA: Diagnosis not present

## 2021-06-18 DIAGNOSIS — C3411 Malignant neoplasm of upper lobe, right bronchus or lung: Secondary | ICD-10-CM | POA: Diagnosis not present

## 2021-06-18 DIAGNOSIS — Z7984 Long term (current) use of oral hypoglycemic drugs: Secondary | ICD-10-CM | POA: Diagnosis not present

## 2021-06-18 DIAGNOSIS — E119 Type 2 diabetes mellitus without complications: Secondary | ICD-10-CM | POA: Diagnosis not present

## 2021-06-18 DIAGNOSIS — Z79899 Other long term (current) drug therapy: Secondary | ICD-10-CM | POA: Diagnosis not present

## 2021-06-18 DIAGNOSIS — Z5111 Encounter for antineoplastic chemotherapy: Secondary | ICD-10-CM | POA: Diagnosis not present

## 2021-06-18 DIAGNOSIS — Z5189 Encounter for other specified aftercare: Secondary | ICD-10-CM | POA: Diagnosis not present

## 2021-06-18 MED ORDER — PEGFILGRASTIM-CBQV 6 MG/0.6ML ~~LOC~~ SOSY
6.0000 mg | PREFILLED_SYRINGE | Freq: Once | SUBCUTANEOUS | Status: AC
  Administered 2021-06-18: 6 mg via SUBCUTANEOUS

## 2021-06-18 MED ORDER — PEGFILGRASTIM-CBQV 6 MG/0.6ML ~~LOC~~ SOSY
PREFILLED_SYRINGE | SUBCUTANEOUS | Status: AC
  Filled 2021-06-18: qty 0.6

## 2021-06-18 NOTE — Patient Instructions (Signed)

## 2021-06-22 ENCOUNTER — Telehealth: Payer: Self-pay | Admitting: Internal Medicine

## 2021-06-22 NOTE — Telephone Encounter (Signed)
Scheduled per los. Called and spoke with patient. Confirmed appts  

## 2021-06-23 ENCOUNTER — Other Ambulatory Visit: Payer: Self-pay | Admitting: Internal Medicine

## 2021-06-23 ENCOUNTER — Other Ambulatory Visit: Payer: Self-pay

## 2021-06-23 ENCOUNTER — Encounter: Payer: Self-pay | Admitting: Internal Medicine

## 2021-06-23 MED ORDER — PANTOPRAZOLE SODIUM 40 MG PO TBEC: 40.0000 mg | DELAYED_RELEASE_TABLET | Freq: Every day | ORAL | 3 refills | Status: AC

## 2021-06-23 MED ORDER — OXYCODONE-ACETAMINOPHEN 5-325 MG PO TABS: 1.0000 | ORAL_TABLET | Freq: Three times a day (TID) | ORAL | 0 refills | Status: DC | PRN

## 2021-06-25 ENCOUNTER — Other Ambulatory Visit: Payer: Self-pay

## 2021-06-25 ENCOUNTER — Inpatient Hospital Stay: Payer: BC Managed Care – PPO

## 2021-06-25 ENCOUNTER — Encounter: Payer: Self-pay | Admitting: Internal Medicine

## 2021-06-25 ENCOUNTER — Other Ambulatory Visit: Payer: Self-pay | Admitting: Medical Oncology

## 2021-06-25 DIAGNOSIS — Z5112 Encounter for antineoplastic immunotherapy: Secondary | ICD-10-CM | POA: Diagnosis not present

## 2021-06-25 DIAGNOSIS — C3411 Malignant neoplasm of upper lobe, right bronchus or lung: Secondary | ICD-10-CM | POA: Diagnosis not present

## 2021-06-25 DIAGNOSIS — Z7984 Long term (current) use of oral hypoglycemic drugs: Secondary | ICD-10-CM | POA: Diagnosis not present

## 2021-06-25 DIAGNOSIS — E119 Type 2 diabetes mellitus without complications: Secondary | ICD-10-CM | POA: Diagnosis not present

## 2021-06-25 DIAGNOSIS — Z5111 Encounter for antineoplastic chemotherapy: Secondary | ICD-10-CM | POA: Diagnosis not present

## 2021-06-25 DIAGNOSIS — Z79899 Other long term (current) drug therapy: Secondary | ICD-10-CM | POA: Diagnosis not present

## 2021-06-25 DIAGNOSIS — Z5189 Encounter for other specified aftercare: Secondary | ICD-10-CM | POA: Diagnosis not present

## 2021-06-25 LAB — CBC WITH DIFFERENTIAL/PLATELET
Abs Immature Granulocytes: 0.15 10*3/uL — ABNORMAL HIGH (ref 0.00–0.07)
Basophils Absolute: 0.1 10*3/uL (ref 0.0–0.1)
Basophils Relative: 1 %
Eosinophils Absolute: 0.1 10*3/uL (ref 0.0–0.5)
Eosinophils Relative: 1 %
HCT: 30.6 % — ABNORMAL LOW (ref 39.0–52.0)
Hemoglobin: 9.7 g/dL — ABNORMAL LOW (ref 13.0–17.0)
Immature Granulocytes: 1 %
Lymphocytes Relative: 6 %
Lymphs Abs: 1.1 10*3/uL (ref 0.7–4.0)
MCH: 29.2 pg (ref 26.0–34.0)
MCHC: 31.7 g/dL (ref 30.0–36.0)
MCV: 92.2 fL (ref 80.0–100.0)
Monocytes Absolute: 1.9 10*3/uL — ABNORMAL HIGH (ref 0.1–1.0)
Monocytes Relative: 10 %
Neutro Abs: 15.3 10*3/uL — ABNORMAL HIGH (ref 1.7–7.7)
Neutrophils Relative %: 81 %
Platelets: 178 10*3/uL (ref 150–400)
RBC: 3.32 MIL/uL — ABNORMAL LOW (ref 4.22–5.81)
RDW: 18.6 % — ABNORMAL HIGH (ref 11.5–15.5)
WBC: 18.7 10*3/uL — ABNORMAL HIGH (ref 4.0–10.5)
nRBC: 0.2 % (ref 0.0–0.2)

## 2021-06-25 LAB — CMP (CANCER CENTER ONLY)
ALT: 10 U/L (ref 0–44)
AST: 13 U/L — ABNORMAL LOW (ref 15–41)
Albumin: 3.5 g/dL (ref 3.5–5.0)
Alkaline Phosphatase: 175 U/L — ABNORMAL HIGH (ref 38–126)
Anion gap: 11 (ref 5–15)
BUN: 13 mg/dL (ref 6–20)
CO2: 25 mmol/L (ref 22–32)
Calcium: 9.5 mg/dL (ref 8.9–10.3)
Chloride: 100 mmol/L (ref 98–111)
Creatinine: 1.34 mg/dL — ABNORMAL HIGH (ref 0.61–1.24)
GFR, Estimated: >60 mL/min (ref >60)
Glucose, Bld: 150 mg/dL — ABNORMAL HIGH (ref 70–99)
Potassium: 5.2 mmol/L — ABNORMAL HIGH (ref 3.5–5.1)
Sodium: 136 mmol/L (ref 135–145)
Total Bilirubin: 0.4 mg/dL (ref 0.3–1.2)
Total Protein: 6.4 g/dL — ABNORMAL LOW (ref 6.5–8.1)

## 2021-06-26 ENCOUNTER — Other Ambulatory Visit: Payer: Self-pay | Admitting: Medical Oncology

## 2021-06-26 ENCOUNTER — Other Ambulatory Visit: Payer: Self-pay | Admitting: Internal Medicine

## 2021-06-26 DIAGNOSIS — E039 Hypothyroidism, unspecified: Secondary | ICD-10-CM

## 2021-06-26 DIAGNOSIS — R112 Nausea with vomiting, unspecified: Secondary | ICD-10-CM

## 2021-06-26 MED ORDER — LEVOTHYROXINE SODIUM 75 MCG PO TABS: ORAL_TABLET | ORAL | 1 refills | Status: DC

## 2021-07-02 ENCOUNTER — Inpatient Hospital Stay: Payer: BC Managed Care – PPO

## 2021-07-02 ENCOUNTER — Other Ambulatory Visit: Payer: Self-pay

## 2021-07-02 DIAGNOSIS — C3411 Malignant neoplasm of upper lobe, right bronchus or lung: Secondary | ICD-10-CM

## 2021-07-02 DIAGNOSIS — Z79899 Other long term (current) drug therapy: Secondary | ICD-10-CM | POA: Diagnosis not present

## 2021-07-02 DIAGNOSIS — E119 Type 2 diabetes mellitus without complications: Secondary | ICD-10-CM | POA: Diagnosis not present

## 2021-07-02 DIAGNOSIS — Z5111 Encounter for antineoplastic chemotherapy: Secondary | ICD-10-CM | POA: Diagnosis not present

## 2021-07-02 DIAGNOSIS — Z5189 Encounter for other specified aftercare: Secondary | ICD-10-CM | POA: Diagnosis not present

## 2021-07-02 DIAGNOSIS — Z7984 Long term (current) use of oral hypoglycemic drugs: Secondary | ICD-10-CM | POA: Diagnosis not present

## 2021-07-02 DIAGNOSIS — Z5112 Encounter for antineoplastic immunotherapy: Secondary | ICD-10-CM | POA: Diagnosis not present

## 2021-07-02 LAB — CBC WITH DIFFERENTIAL (CANCER CENTER ONLY)
Abs Immature Granulocytes: 0.1 10*3/uL — ABNORMAL HIGH (ref 0.00–0.07)
Basophils Absolute: 0.1 10*3/uL (ref 0.0–0.1)
Basophils Relative: 1 %
Eosinophils Absolute: 0.2 10*3/uL (ref 0.0–0.5)
Eosinophils Relative: 1 %
HCT: 32.9 % — ABNORMAL LOW (ref 39.0–52.0)
Hemoglobin: 10.5 g/dL — ABNORMAL LOW (ref 13.0–17.0)
Immature Granulocytes: 1 %
Lymphocytes Relative: 7 %
Lymphs Abs: 1.1 10*3/uL (ref 0.7–4.0)
MCH: 29.5 pg (ref 26.0–34.0)
MCHC: 31.9 g/dL (ref 30.0–36.0)
MCV: 92.4 fL (ref 80.0–100.0)
Monocytes Absolute: 0.9 10*3/uL (ref 0.1–1.0)
Monocytes Relative: 6 %
Neutro Abs: 13.1 10*3/uL — ABNORMAL HIGH (ref 1.7–7.7)
Neutrophils Relative %: 84 %
Platelet Count: 223 10*3/uL (ref 150–400)
RBC: 3.56 MIL/uL — ABNORMAL LOW (ref 4.22–5.81)
RDW: 18.7 % — ABNORMAL HIGH (ref 11.5–15.5)
WBC Count: 15.5 10*3/uL — ABNORMAL HIGH (ref 4.0–10.5)
nRBC: 0.1 % (ref 0.0–0.2)

## 2021-07-02 LAB — CMP (CANCER CENTER ONLY)
ALT: 10 U/L (ref 0–44)
AST: 14 U/L — ABNORMAL LOW (ref 15–41)
Albumin: 3.8 g/dL (ref 3.5–5.0)
Alkaline Phosphatase: 156 U/L — ABNORMAL HIGH (ref 38–126)
Anion gap: 13 (ref 5–15)
BUN: 11 mg/dL (ref 6–20)
CO2: 24 mmol/L (ref 22–32)
Calcium: 9.9 mg/dL (ref 8.9–10.3)
Chloride: 101 mmol/L (ref 98–111)
Creatinine: 1.58 mg/dL — ABNORMAL HIGH (ref 0.61–1.24)
GFR, Estimated: 50 mL/min — ABNORMAL LOW (ref >60)
Glucose, Bld: 142 mg/dL — ABNORMAL HIGH (ref 70–99)
Potassium: 4.8 mmol/L (ref 3.5–5.1)
Sodium: 138 mmol/L (ref 135–145)
Total Bilirubin: 0.5 mg/dL (ref 0.3–1.2)
Total Protein: 7.6 g/dL (ref 6.5–8.1)

## 2021-07-07 NOTE — Progress Notes (Addendum)
[facility] OFFICE PROGRESS NOTE  [name], DO [address] [place] [address]  DIAGNOSIS: Recurrent/progressive non-small cell lung cancer initially diagnosed as stage IIIA (T2a, N2, M0) non-small cell lung cancer, squamous cell carcinoma diagnosed in April 2021, presented with right upper lobe/suprahilar lung mass in addition to right paratracheal and subcarinal lymphadenopathy.   Molecular Studies by [employer]: No actionable mutations  PRIOR THERAPY: 1) Concurrent chemoradiation with weekly carboplatin for AUC of 2 and paclitaxel 45 NG/M2.  First dose on Mar 31, 2020.  Status post 7 cycles.  Last dose was given on 05/12/2020 with partial response. 2) Consolidation immunotherapy with durvalumab 1500 mg IV every 4 weeks.  First dose May 12, 2020. Status post 3 cycles.  3) Systemic chemotherapy with carboplatin for an AUC of 5, paclitaxel 175 mg/m2, and Keytruda 200 mg IV every 3 weeks. First dose expected on 10/02/20.   Status post 3 cycles.  Last dose was given on November 13, 2020. 4) SBRT to the enlarging right upper lobe lung nodules under the care of Dr. Lisbeth Renshaw.  CURRENT THERAPY: Second line systemic chemotherapy with docetaxel 75 Mg/M2 and Cyramza 10 mg/KG every 3 weeks with Neulasta support.  His dose was reduced to docetaxel 65 mg/m2 due to fatigue and intolerance. First dose December 11, 2020.  Status post 10 cycles.  INTERVAL HISTORY: William Farrell 60 y.o. male returns to the clinic today for a follow up visit accompanied by his wife. The patient is feeling frustrated today due to the side effects of treatment. He is having weight loss, fatigue, and generalized weakness.  He states certain consistency of foods is difficult to eat. It balls up in his mouth and he is unable to swallow it. He tried OTC medications to help with siliva production which were ineffective. He wants a prescription for a medication to stimulate saliva production. He  has not tried puree foods. Meats are particularly challenging for him to eat. He states he has an appetite it is just that he feels like he is not breaking it down the foods. He is followed by GI for esophageal strictures which have been dilated. He said he was re-evaluated by GI and states that he does not need his esophagus re-stretched. He is on 2.5 mg  BID of marinol for appetite stimulation. He lost 6 lbs since his last appointment. He drinks ensure and changes up his diet for foods that he is able to tolerate.   Due to his worsening symptoms, at his last cycle of treatment had dose reduced docetaxel. He denies constipation but moreso has diarrhea which he attributes to his diet. No recent nausea or vomiting besides with drinking the scan contrast a few weeks ago.  He has some sternal discomfort with coughing for which he recently saw pulmonology and received cough suppressants and inhalers. He has shortness of breath with minimal exertion and cough. He recently had a brain MRI performed which was negative for metastatic disease to the brain due to the complaint of headaches. He wants to know his prognosis.  The patient is here today for evaluation before starting cycle #11.   MEDICAL HISTORY: Past Medical History:  Diagnosis Date   Asthma    as a child   Diabetes ([facility])    Diverticulitis    GERD (gastroesophageal reflux disease)    High cholesterol    History of kidney stones    Lung cancer ([facility])    Wears glasses  ALLERGIES:  is allergic to sitagliptin.  MEDICATIONS:  Current Outpatient Medications  Medication Sig Dispense Refill   pilocarpine (SALAGEN) 5 MG tablet Take 1 tablet (5 mg total) by mouth 3 (three) times daily. 30 tablet 2   acetaminophen (TYLENOL) 500 MG tablet Take 1,000 mg by mouth every 6 (six) hours as needed for mild pain.     albuterol (PROVENTIL) (2.5 MG/3ML) 0.083% nebulizer solution Take 3 mLs (2.5 mg total) by nebulization every 4 (four) hours as needed for  wheezing or shortness of breath (EVERY 4 HOURS AND PRN). 120 mL 5   albuterol (VENTOLIN HFA) 108 (90 Base) MCG/ACT inhaler Inhale 1-2 puffs into the lungs every 4 (four) hours as needed for wheezing or shortness of breath. 18 g 3   aspirin EC 81 MG tablet Take 81 mg by mouth at bedtime.      atorvastatin (LIPITOR) 40 MG tablet TAKE 1 TABLET DAILY (Patient taking differently: Take 40 mg by mouth at bedtime.) 90 tablet 3   benzonatate (TESSALON) 200 MG capsule TAKE 1 CAPSULE (200 MG TOTAL) BY MOUTH 3 (THREE) TIMES DAILY AS NEEDED FOR COUGH. 90 capsule 1   Budeson-Glycopyrrol-Formoterol (BREZTRI AEROSPHERE) 160-9-4.8 MCG/ACT AERO Inhale 2 puffs into the lungs in the morning and at bedtime. 10.7 g 0   dronabinol (MARINOL) 5 MG capsule Take 1 capsule by mouth 2  times daily before lunch and supper. 60 capsule 2   Ferrous Fumarate-Vitamin C ER 65-25 MG TBCR Take 65 mg by mouth daily.     glipiZIDE (GLUCOTROL) 5 MG tablet Take 1 tablet (5 mg total) by mouth 2 (two) times daily before a meal. 180 tablet 3   Lancets (ONETOUCH DELICA PLUS [id number]) MISC USE AS DIRECTED UP TO FOUR TIMES A DAY 100 each 13   levothyroxine (SYNTHROID) 75 MCG tablet TAKE 1 TABLET BY MOUTH EVERY DAY BEFORE BREAKFAST 30 tablet 1   lidocaine-prilocaine (EMLA) cream Apply 1 application topically as needed. (Patient taking differently: Apply 1 application topically as needed (port access).) 30 g 2   loratadine (CLARITIN) 10 MG tablet Take 10 mg by mouth daily as needed for allergies (after chemo shot Fri, sat. sun.).     meloxicam (MOBIC) 15 MG tablet TAKE 1 TABLET DAILY (Patient taking differently: Take 15 mg by mouth daily.) 90 tablet 3   metFORMIN (GLUCOPHAGE) 1000 MG tablet TAKE 1 TABLET TWICE A DAY WITH MEALS 180 tablet 1   ondansetron (ZOFRAN) 8 MG tablet Take 1 tablet (8 mg total) by mouth every 8 (eight) hours as needed for nausea or vomiting. 30 tablet 2   ONETOUCH ULTRA test strip USE AS INSTRUCTED UP TO FOUR TIMES A DAY 100  strip 13   oxyCODONE-acetaminophen (PERCOCET/ROXICET) 5-325 MG tablet Take 1 tablet by mouth every 8 (eight) hours as needed for severe pain. 30 tablet 0   pantoprazole (PROTONIX) 40 MG tablet Take 1 tablet (40 mg total) by mouth daily. 90 tablet 3   Semaglutide,0.25 or 0.5MG /DOS, (OZEMPIC, 0.25 OR 0.5 MG/DOSE,) 2 MG/1.5ML SOPN Inject 0.5 mg into the skin once a week. 7.5 mL 1   temazepam (RESTORIL) 15 MG capsule Take 1 capsule (15 mg total) by mouth at bedtime as needed for sleep. (Patient not taking: Reported on 06/17/2021) 30 capsule 0   Tiotropium Bromide-Olodaterol (STIOLTO RESPIMAT) 2.5-2.5 MCG/ACT AERS Inhale 2 puffs into the lungs daily. 3 each 3   No current facility-administered medications for this visit.   Facility-Administered Medications Ordered in Other Visits  Medication Dose  Route Frequency Provider Last Rate Last Admin   DOCEtaxel (TAXOTERE) 140 mg in sodium chloride 0.9 % 250 mL chemo infusion  65 mg/m2 (Order-Specific) Intravenous Once Curt Bears, MD       heparin lock flush 100 unit/mL  500 Units Intracatheter Once PRN Curt Bears, MD       ramucirumab [facility]) 1,000 mg in sodium chloride 0.9 % 150 mL chemo infusion  10 mg/kg (Order-Specific) Intravenous Once Curt Bears, MD 500 mL/hr at 07/09/21 1212 1,000 mg at 07/09/21 1212   sodium chloride flush (NS) 0.9 % injection 10 mL  10 mL Intracatheter PRN Curt Bears, MD        SURGICAL HISTORY:  Past Surgical History:  Procedure Laterality Date   BIOPSY  03/25/2021   Procedure: BIOPSY;  Surgeon: Lavena Bullion, DO;  Location: [facility];  Service: Gastroenterology;;   BIOPSY  06/11/2021   Procedure: BIOPSY;  Surgeon: Lavena Bullion, DO;  Location: [facility];  Service: Gastroenterology;;   broken bone repair     BRONCHIAL BIOPSY  03/11/2020   Procedure: BRONCHIAL BIOPSIES;  Surgeon: Garner Nash, DO;  Location: [facility];  Service: Pulmonary;;   COLONOSCOPY     around [place]. [place]     CRYOTHERAPY  03/11/2020   Procedure: CRYOTHERAPY;  Surgeon: Garner Nash, DO;  Location: [facility];  Service: Pulmonary;;   ESOPHAGEAL DILATION  03/25/2021   Procedure: ESOPHAGEAL DILATION;  Surgeon: Lavena Bullion, DO;  Location: [facility];  Service: Gastroenterology;;   ESOPHAGOGASTRODUODENOSCOPY (EGD) WITH PROPOFOL N/A 03/25/2021   Procedure: ESOPHAGOGASTRODUODENOSCOPY (EGD) WITH PROPOFOL;  Surgeon: Lavena Bullion, DO;  Location: [facility];  Service: Gastroenterology;  Laterality: N/A;  dialation of stricture   ESOPHAGOGASTRODUODENOSCOPY (EGD) WITH PROPOFOL N/A 06/11/2021   Procedure: ESOPHAGOGASTRODUODENOSCOPY (EGD) WITH PROPOFOL;  Surgeon: Lavena Bullion, DO;  Location: [facility];  Service: Gastroenterology;  Laterality: N/A;   FINE NEEDLE ASPIRATION  03/11/2020   Procedure: FINE NEEDLE ASPIRATION (FNA) LINEAR;  Surgeon: Garner Nash, DO;  Location: [facility];  Service: Pulmonary;;   IR IMAGING GUIDED PORT INSERTION  10/16/2020   KNEE ARTHROSCOPY WITH ANTERIOR CRUCIATE LIGAMENT (ACL) REPAIR     x 2   VIDEO BRONCHOSCOPY WITH ENDOBRONCHIAL ULTRASOUND N/A 03/11/2020   Procedure: VIDEO BRONCHOSCOPY WITH ENDOBRONCHIAL ULTRASOUND;  Surgeon: Garner Nash, DO;  Location: [facility];  Service: Pulmonary;  Laterality: N/A;    REVIEW OF SYSTEMS:   Review of Systems  Constitutional: Positive for fatigue, generalized weakness, and weight loss.  Negative for chills and fever. HENT: Positive for changes with swallowing.  Negative for mouth sores, nosebleeds, and sore throat.    Eyes: Negative for eye problems and icterus.  Respiratory: Positive for cough and dyspnea on exertion.  Negative for hemoptysis. Cardiovascular: Negative for chest pain and leg swelling.  Gastrointestinal: Positive for emesis with forceful coughing.  Positive for occasional diarrhea.  Negative for constipation and nausea.  Genitourinary: Negative for bladder incontinence, difficulty  urinating, dysuria, frequency and hematuria.   Musculoskeletal: Negative for back pain, gait problem, neck pain and neck stiffness.  Skin: Negative for itching and rash.  Neurological: Negative for dizziness, extremity weakness, gait problem, headaches, light-headedness and seizures.  Hematological: Negative for adenopathy. Does not bruise/bleed easily.  Psychiatric/Behavioral: Negative for confusion, depression and sleep disturbance. The patient is not nervous/anxious.     PHYSICAL EXAMINATION:  Blood pressure 135/82, pulse (!) 113, temperature (!) 96.7 F (35.9 C), temperature source Oral, resp. rate 18, weight 210  lb 4.8 oz (95.4 kg), SpO2 100 %.  ECOG PERFORMANCE STATUS: 1  Physical Exam  Constitutional: Oriented to person, place, and time and well-developed, well-nourished, and in no distress.  HENT:  Head: Normocephalic and atraumatic.  Mouth/Throat: Oropharynx is clear and moist. No oropharyngeal exudate.  Eyes: Conjunctivae are normal. Right eye exhibits no discharge. Left eye exhibits no discharge. No scleral icterus.  Neck: Normal range of motion. Neck supple.  Cardiovascular: Normal rate, regular rhythm, normal heart sounds and intact distal pulses.   Pulmonary/Chest: Effort normal and breath sounds normal. No respiratory distress. No wheezes. No rales.  Abdominal: Soft. Bowel sounds are normal. Exhibits no distension and no mass. There is no tenderness.  Musculoskeletal: Normal range of motion. Exhibits no edema.  Lymphadenopathy:    No cervical adenopathy.  Neurological: Alert and oriented to person, place, and time. Exhibits normal muscle tone. Gait normal. Coordination normal.  Skin: Skin is warm and dry. No rash noted. Not diaphoretic. No erythema. Positive for pallor.  Psychiatric: Mood, memory and judgment normal.  Vitals reviewed.  LABORATORY DATA: Lab Results  Component Value Date   WBC 22.0 (H) 07/09/2021   HGB 9.2 (L) 07/09/2021   HCT 28.2 (L) 07/09/2021    MCV 91.0 07/09/2021   PLT 268 07/09/2021      Chemistry      Component Value Date/Time   NA 136 07/09/2021 0927   K 4.6 07/09/2021 0927   CL 104 07/09/2021 0927   CO2 22 07/09/2021 0927   BUN 17 07/09/2021 0927   CREATININE 1.46 (H) 07/09/2021 0927   CREATININE 1.10 01/14/2020 0859      Component Value Date/Time   CALCIUM 9.6 07/09/2021 0927   ALKPHOS 117 07/09/2021 0927   AST 10 (L) 07/09/2021 0927   ALT 10 07/09/2021 0927   BILITOT 0.4 07/09/2021 0927       RADIOGRAPHIC STUDIES:  No results found.   ASSESSMENT/PLAN:  This is a very pleasant 60 year old Caucasian male diagnosed with recurrent lung cancer initially diagnosed as stage IIIa non-small cell lung cancer, squamous cell carcinoma in April 2021 presented with right upper lobe/suprahilar lung mass in addition to right paratracheal and subcarinal lymphadenopathy.  He does not have any actionable mutations by [employer]ant 360.    He underwent a course of concurrent chemoradiation with weekly carboplatin for an AUC of 2 and paclitaxel 45 mg per metered square.  He is status post 7 cycles.  He tolerated this well except for some mild dysphagia and odynophagia.   He then underwent consolidation immunotherapy with Imfinzi 1500 mg IV every 4 weeks.  He was status post 3 cycles but had evidence of disease progression after cycle #3 so this treatment was discontinued.  He then started first-line chemotherapy with carboplatin for an AUC of 5, paclitaxel 175 mg per metered squared, Keytruda 200 mg IV every 3 weeks.  He was status post 3 cycles which showed an enlarging nodule and masses in the chest and enlarging lymph nodes.  This was suspicious for disease progression.   Therefore, the patient was started on systemic palliative chemotherapy with second line docetaxel 75 mg per metered squared and Cyramza 10 mg/kg IV every 3 weeks with Neulasta support.  The patient is status post 10 cycles. His docetaxel was dose reduced to 65 mg/m2  due to side effects.   The patient also underwent palliative radiotherapy to the enlarging right upper lobe lung mass under the care of Dr. Lisbeth Renshaw  The patient was seen with  Dr. Julien Nordmann today. Dr. Julien Nordmann had a lengthy discussion with the patient about his current condition and prognosis. Dr. Julien Nordmann reviewed that his current treatment does have a tendency to have a higher side effect profile; however, it is also more effective than other options that we have at this point. Discussed that we manage side effects to the best of our ability. Discussed with the patient that at any point, the patient may determine if they would like to stop treatment. The patient would like to continue for now. Labs were reviewed. Recommend that he proceed with cycle #11 today as scheduled.   We will see him back for a follow up visit in 3 weeks for evaluation before starting cycle #12.   Patient will continue to take Restoril as needed for insomnia.Marland Kitchen   He will continue with marinol for his decreased appetite. We will increase his dose to 5 mg BID. Dr. Julien Nordmann discussed wed can try megace if no improvement with the dose adjustment of marinol. Dr. Julien Nordmann did discuss the risk of blood clots with megace.   He is noting a lot of frustrations related to inability to break down foods and would like to try a prescription medication. Discussed we can start him on pilocarpine 5 mg TID. We discussed the adverse side effects of treatment with the patient. He still would like to proceed. For lifestyle modifications, I encouraged him to try to puree his food to a consistency that he is able to tolerate. Also encouraged him to take smaller bites and swallow completely before taking additional bites. He will continue stay hydrated with Gatorade, water, pedialyte, etc. Dr. Julien Nordmann discussed that having cancer can cause weight loss and the importance of good nutrition.   Dr. Julien Nordmann discussed that he did not want to pursue radiation to the  4 mm enlargement of the right supraclavicular nodal metastasis at this time due to worsening symptoms of esophagitis and decreased saliva formation. However, we will monitor closely and will arrange radiation if needed based on follow up imaging.   The patient notes that the weekly labs are challenging for him. Since his labs have been stable, Dr. Julien Nordmann is comfortable arranging blood work every 3 weeks on the days of treatment.    The patient was advised to call immediately if he has any concerning symptoms in the interval. The patient voices understanding of current disease status and treatment options and is in agreement with the current care plan. All questions were answered. The patient knows to call the clinic with any problems, questions or concerns. We can certainly see the patient much sooner if necessary   Orders Placed This Encounter  Procedures   TSH    Standing Status:   Standing    Number of Occurrences:   10    Standing Expiration Date:   07/09/2022       Tobe Sos Laiza Veenstra, PA-C 07/09/21  ADDENDUM: Hematology/Oncology Attending: I had a face-to-face encounter with the patient today.  I reviewed his record, lab and recommended his care plan.  This is a 60 years old white male with metastatic non-small cell lung cancer that was initially diagnosed as a stage IIIa, squamous cell carcinoma in April 2021 with no actionable mutations.  The patient underwent a course of concurrent chemoradiation with weekly carboplatin and paclitaxel with partial response but complication of dysphagia and odynophagia.  He also underwent consolidation treatment with immunotherapy with Imfinzi status post 3 cycles but unfortunately he has evidence for disease progression and  this treatment was discontinued. The patient started systemic chemotherapy with carboplatin, paclitaxel and Keytruda status post 3 cycles but this was again discontinued secondary to disease progression with enlargement of  the nodules and masses in the lung as well as lymph nodes. He started second line systemic chemotherapy with docetaxel and Cyramza status post 10 cycles.  His dose of docetaxel was reduced to 65 Mg/M2 secondary to adverse effects. The patient continues to complain of increasing fatigue and weakness as well as dry mouth and lack of appetite and weight loss.  He is very upset with the adverse effect of the treatment but at the same time would not consider discontinuing the treatment. I had a lengthy discussion with the patient and his wife again about his current condition, prognosis and treatment options. I offered the patient to discontinue the treatment at this point because of the adverse effect but he declined. He would like to continue his treatment and we will proceed with cycle #11 with docetaxel at a reduced dose of 65 Mg/M2 and Cyramza 10 Mg/KG every 3 weeks with Neulasta support. For the weight loss, is currently on Marinol 2.5 mg p.o. twice daily.  We will increase his dose to 5 mg p.o. twice daily.  If no improvement we may consider The patient for treatment with Megace ES 625 mg p.o. daily. For the dry mouth The patient tried over the counter SalivaSure with no improvement.  We will start him on pilocarpine 5 mg p.o. every 6 hours as needed. He has a lot of questions today and I answer his question to the best of my knowledge and to his satisfaction. He will come back for follow-up visit in 3 weeks for evaluation. The patient was advised to call immediately if he has any other concerning symptoms in the interval. The total time spent in the appointment was 55 minutes.  Disclaimer: This note was dictated with voice recognition software. Similar sounding words can inadvertently be transcribed and may be missed upon review. Eilleen Kempf, MD 07/10/21

## 2021-07-08 ENCOUNTER — Encounter: Payer: Self-pay | Admitting: Internal Medicine

## 2021-07-09 ENCOUNTER — Inpatient Hospital Stay (HOSPITAL_BASED_OUTPATIENT_CLINIC_OR_DEPARTMENT_OTHER): Payer: BC Managed Care – PPO | Admitting: Physician Assistant

## 2021-07-09 ENCOUNTER — Other Ambulatory Visit: Payer: Self-pay

## 2021-07-09 ENCOUNTER — Other Ambulatory Visit (HOSPITAL_COMMUNITY): Payer: Self-pay

## 2021-07-09 ENCOUNTER — Inpatient Hospital Stay: Payer: BC Managed Care – PPO

## 2021-07-09 ENCOUNTER — Encounter: Payer: Self-pay | Admitting: Internal Medicine

## 2021-07-09 VITALS — BP 135/82 | HR 113 | Temp 96.7°F | Resp 18 | Wt 210.3 lb

## 2021-07-09 VITALS — HR 107

## 2021-07-09 DIAGNOSIS — C3411 Malignant neoplasm of upper lobe, right bronchus or lung: Secondary | ICD-10-CM

## 2021-07-09 DIAGNOSIS — R112 Nausea with vomiting, unspecified: Secondary | ICD-10-CM | POA: Diagnosis not present

## 2021-07-09 DIAGNOSIS — K222 Esophageal obstruction: Secondary | ICD-10-CM

## 2021-07-09 DIAGNOSIS — E860 Dehydration: Secondary | ICD-10-CM

## 2021-07-09 DIAGNOSIS — K117 Disturbances of salivary secretion: Secondary | ICD-10-CM | POA: Diagnosis not present

## 2021-07-09 DIAGNOSIS — Z95828 Presence of other vascular implants and grafts: Secondary | ICD-10-CM

## 2021-07-09 DIAGNOSIS — Z7984 Long term (current) use of oral hypoglycemic drugs: Secondary | ICD-10-CM | POA: Diagnosis not present

## 2021-07-09 DIAGNOSIS — R1319 Other dysphagia: Secondary | ICD-10-CM

## 2021-07-09 DIAGNOSIS — R634 Abnormal weight loss: Secondary | ICD-10-CM

## 2021-07-09 DIAGNOSIS — Z5112 Encounter for antineoplastic immunotherapy: Secondary | ICD-10-CM | POA: Diagnosis not present

## 2021-07-09 DIAGNOSIS — E119 Type 2 diabetes mellitus without complications: Secondary | ICD-10-CM | POA: Diagnosis not present

## 2021-07-09 DIAGNOSIS — R5383 Other fatigue: Secondary | ICD-10-CM

## 2021-07-09 DIAGNOSIS — Z79899 Other long term (current) drug therapy: Secondary | ICD-10-CM | POA: Diagnosis not present

## 2021-07-09 DIAGNOSIS — Z5111 Encounter for antineoplastic chemotherapy: Secondary | ICD-10-CM

## 2021-07-09 DIAGNOSIS — Z5189 Encounter for other specified aftercare: Secondary | ICD-10-CM | POA: Diagnosis not present

## 2021-07-09 LAB — CMP (CANCER CENTER ONLY)
ALT: 10 U/L (ref 0–44)
AST: 10 U/L — ABNORMAL LOW (ref 15–41)
Albumin: 2.9 g/dL — ABNORMAL LOW (ref 3.5–5.0)
Alkaline Phosphatase: 117 U/L (ref 38–126)
Anion gap: 10 (ref 5–15)
BUN: 17 mg/dL (ref 6–20)
CO2: 22 mmol/L (ref 22–32)
Calcium: 9.6 mg/dL (ref 8.9–10.3)
Chloride: 104 mmol/L (ref 98–111)
Creatinine: 1.46 mg/dL — ABNORMAL HIGH (ref 0.61–1.24)
GFR, Estimated: 55 mL/min — ABNORMAL LOW (ref >60)
Glucose, Bld: 242 mg/dL — ABNORMAL HIGH (ref 70–99)
Potassium: 4.6 mmol/L (ref 3.5–5.1)
Sodium: 136 mmol/L (ref 135–145)
Total Bilirubin: 0.4 mg/dL (ref 0.3–1.2)
Total Protein: 6.9 g/dL (ref 6.5–8.1)

## 2021-07-09 LAB — CBC WITH DIFFERENTIAL (CANCER CENTER ONLY)
Abs Immature Granulocytes: 0.13 10*3/uL — ABNORMAL HIGH (ref 0.00–0.07)
Basophils Absolute: 0 10*3/uL (ref 0.0–0.1)
Basophils Relative: 0 %
Eosinophils Absolute: 0 10*3/uL (ref 0.0–0.5)
Eosinophils Relative: 0 %
HCT: 28.2 % — ABNORMAL LOW (ref 39.0–52.0)
Hemoglobin: 9.2 g/dL — ABNORMAL LOW (ref 13.0–17.0)
Immature Granulocytes: 1 %
Lymphocytes Relative: 4 %
Lymphs Abs: 0.9 10*3/uL (ref 0.7–4.0)
MCH: 29.7 pg (ref 26.0–34.0)
MCHC: 32.6 g/dL (ref 30.0–36.0)
MCV: 91 fL (ref 80.0–100.0)
Monocytes Absolute: 1.3 10*3/uL — ABNORMAL HIGH (ref 0.1–1.0)
Monocytes Relative: 6 %
Neutro Abs: 19.6 10*3/uL — ABNORMAL HIGH (ref 1.7–7.7)
Neutrophils Relative %: 89 %
Platelet Count: 268 10*3/uL (ref 150–400)
RBC: 3.1 MIL/uL — ABNORMAL LOW (ref 4.22–5.81)
RDW: 18.8 % — ABNORMAL HIGH (ref 11.5–15.5)
WBC Count: 22 10*3/uL — ABNORMAL HIGH (ref 4.0–10.5)
nRBC: 0 % (ref 0.0–0.2)

## 2021-07-09 MED ORDER — HEPARIN SOD (PORK) LOCK FLUSH 100 UNIT/ML IV SOLN
500.0000 [IU] | Freq: Once | INTRAVENOUS | Status: AC | PRN
Start: 2021-07-09 — End: 2021-07-09
  Administered 2021-07-09: 500 [IU]

## 2021-07-09 MED ORDER — SODIUM CHLORIDE 0.9% FLUSH
10.0000 mL | Freq: Once | INTRAVENOUS | Status: AC
  Administered 2021-07-09: 10 mL

## 2021-07-09 MED ORDER — DRONABINOL 5 MG PO CAPS
5.0000 mg | ORAL_CAPSULE | Freq: Two times a day (BID) | ORAL | 2 refills | Status: DC
  Filled 2021-07-09: qty 60, 30d supply, fill #0

## 2021-07-09 MED ORDER — ACETAMINOPHEN 325 MG PO TABS
650.0000 mg | ORAL_TABLET | Freq: Once | ORAL | Status: AC
  Administered 2021-07-09: 650 mg via ORAL
  Filled 2021-07-09: qty 2

## 2021-07-09 MED ORDER — ONDANSETRON HCL 8 MG PO TABS: 8.0000 mg | ORAL_TABLET | Freq: Three times a day (TID) | ORAL | 20 refills | Status: DC | PRN

## 2021-07-09 MED ORDER — DIPHENHYDRAMINE HCL 50 MG/ML IJ SOLN
50.0000 mg | Freq: Once | INTRAMUSCULAR | Status: AC
  Administered 2021-07-09: 50 mg via INTRAVENOUS
  Filled 2021-07-09: qty 1

## 2021-07-09 MED ORDER — SODIUM CHLORIDE 0.9 % IV SOLN
10.0000 mg/kg | Freq: Once | INTRAVENOUS | Status: AC
  Administered 2021-07-09: 1000 mg via INTRAVENOUS
  Filled 2021-07-09: qty 100

## 2021-07-09 MED ORDER — ONDANSETRON HCL 8 MG PO TABS: 8.0000 mg | ORAL_TABLET | Freq: Three times a day (TID) | ORAL | 2 refills | Status: DC | PRN

## 2021-07-09 MED ORDER — SODIUM CHLORIDE 0.9 % IV SOLN: Freq: Once | INTRAVENOUS | Status: AC

## 2021-07-09 MED ORDER — ONDANSETRON HCL 4 MG/2ML IJ SOLN
8.0000 mg | Freq: Once | INTRAMUSCULAR | Status: AC
  Administered 2021-07-09: 8 mg via INTRAVENOUS
  Filled 2021-07-09: qty 4

## 2021-07-09 MED ORDER — SODIUM CHLORIDE 0.9% FLUSH
10.0000 mL | INTRAVENOUS | Status: DC | PRN
  Administered 2021-07-09: 10 mL

## 2021-07-09 MED ORDER — SODIUM CHLORIDE 0.9 % IV SOLN
65.0000 mg/m2 | Freq: Once | INTRAVENOUS | Status: AC
  Administered 2021-07-09: 140 mg via INTRAVENOUS
  Filled 2021-07-09: qty 14

## 2021-07-09 MED ORDER — SODIUM CHLORIDE 0.9 % IV SOLN
10.0000 mg | Freq: Once | INTRAVENOUS | Status: AC
  Administered 2021-07-09: 10 mg via INTRAVENOUS
  Filled 2021-07-09: qty 10

## 2021-07-09 MED ORDER — PILOCARPINE HCL 5 MG PO TABS
5.0000 mg | ORAL_TABLET | Freq: Three times a day (TID) | ORAL | 2 refills | Status: DC
  Filled 2021-07-09: qty 30, 10d supply, fill #0

## 2021-07-09 NOTE — Progress Notes (Signed)
OK to trt with elevated HR of 107 per [place]

## 2021-07-09 NOTE — Patient Instructions (Signed)
[facility]  Discharge Instructions: Thank you for choosing [facility] to provide your oncology and hematology care.   If you have a lab appointment with the [facility], please go directly to the [facility] and check in at the registration area.   Wear comfortable clothing and clothing appropriate for easy access to any Portacath or PICC line.   We strive to give you quality time with your provider. You may need to reschedule your appointment if you arrive late (15 or more minutes).  Arriving late affects you and other patients whose appointments are after yours.  Also, if you miss three or more appointments without notifying the office, you may be dismissed from the clinic at the provider's discretion.      For prescription refill requests, have your pharmacy contact our office and allow 72 hours for refills to be completed.    Today you received the following chemotherapy and/or immunotherapy agents: Cyramza, Taxotere   To help prevent nausea and vomiting after your treatment, we encourage you to take your nausea medication as directed.  BELOW ARE SYMPTOMS THAT SHOULD BE REPORTED IMMEDIATELY: *FEVER GREATER THAN 100.4 F (38 C) OR HIGHER *CHILLS OR SWEATING *NAUSEA AND VOMITING THAT IS NOT CONTROLLED WITH YOUR NAUSEA MEDICATION *UNUSUAL SHORTNESS OF BREATH *UNUSUAL BRUISING OR BLEEDING *URINARY PROBLEMS (pain or burning when urinating, or frequent urination) *BOWEL PROBLEMS (unusual diarrhea, constipation, pain near the anus) TENDERNESS IN MOUTH AND THROAT WITH OR WITHOUT PRESENCE OF ULCERS (sore throat, sores in mouth, or a toothache) UNUSUAL RASH, SWELLING OR PAIN  UNUSUAL VAGINAL DISCHARGE OR ITCHING   Items with * indicate a potential emergency and should be followed up as soon as possible or go to the Emergency Department if any problems should occur.  Please show the CHEMOTHERAPY ALERT CARD or IMMUNOTHERAPY ALERT CARD at  check-in to the Emergency Department and triage nurse.  Should you have questions after your visit or need to cancel or reschedule your appointment, please contact [facility]  Dept: [phone]  and follow the prompts.  Office hours are 8:00 a.m. to 4:30 p.m. Monday - Friday. Please note that voicemails left after 4:00 p.m. may not be returned until the following business day.  We are closed weekends and major holidays. You have access to a nurse at all times for urgent questions. Please call the main number to the clinic Dept: [phone] and follow the prompts.   For any non-urgent questions, you may also contact your provider using MyChart. We now offer e-Visits for anyone 71 and older to request care online for non-urgent symptoms. For details visit [email or web address].   Also download the MyChart app! Go to the app store, search "MyChart", open the app, select [facility], and log in with your MyChart username and password.  Due to Covid, a mask is required upon entering the hospital/clinic. If you do not have a mask, one will be given to you upon arrival. For doctor visits, patients may have 1 support person aged 58 or older with them. For treatment visits, patients cannot have anyone with them due to current Covid guidelines and our immunocompromised population.

## 2021-07-10 ENCOUNTER — Inpatient Hospital Stay: Payer: BC Managed Care – PPO

## 2021-07-10 ENCOUNTER — Encounter: Payer: Self-pay | Admitting: Internal Medicine

## 2021-07-10 VITALS — BP 116/79 | HR 105 | Temp 97.6°F | Resp 19

## 2021-07-10 DIAGNOSIS — Z5112 Encounter for antineoplastic immunotherapy: Secondary | ICD-10-CM | POA: Diagnosis not present

## 2021-07-10 DIAGNOSIS — C3411 Malignant neoplasm of upper lobe, right bronchus or lung: Secondary | ICD-10-CM | POA: Diagnosis not present

## 2021-07-10 DIAGNOSIS — Z7984 Long term (current) use of oral hypoglycemic drugs: Secondary | ICD-10-CM | POA: Diagnosis not present

## 2021-07-10 DIAGNOSIS — Z5189 Encounter for other specified aftercare: Secondary | ICD-10-CM | POA: Diagnosis not present

## 2021-07-10 DIAGNOSIS — Z5111 Encounter for antineoplastic chemotherapy: Secondary | ICD-10-CM | POA: Diagnosis not present

## 2021-07-10 DIAGNOSIS — Z79899 Other long term (current) drug therapy: Secondary | ICD-10-CM | POA: Diagnosis not present

## 2021-07-10 DIAGNOSIS — E119 Type 2 diabetes mellitus without complications: Secondary | ICD-10-CM | POA: Diagnosis not present

## 2021-07-10 MED ORDER — PEGFILGRASTIM-CBQV 6 MG/0.6ML ~~LOC~~ SOSY
6.0000 mg | PREFILLED_SYRINGE | Freq: Once | SUBCUTANEOUS | Status: AC
  Administered 2021-07-10: 6 mg via SUBCUTANEOUS
  Filled 2021-07-10: qty 0.6

## 2021-07-13 ENCOUNTER — Other Ambulatory Visit: Payer: Self-pay | Admitting: Osteopathic Medicine

## 2021-07-14 ENCOUNTER — Telehealth: Payer: Self-pay

## 2021-07-14 NOTE — Telephone Encounter (Signed)
I have spoken with pts wife to advise I have submitted a PA request to his insurance to determine if they will cover more than 9 tablets of Zofran at a time. I advised the notification indicates that it will take 72hrs for a decision. She expressed understanding of this information.

## 2021-07-14 NOTE — Telephone Encounter (Signed)
Opened in error

## 2021-07-15 ENCOUNTER — Other Ambulatory Visit: Payer: Self-pay | Admitting: Physician Assistant

## 2021-07-15 ENCOUNTER — Telehealth: Payer: Self-pay

## 2021-07-15 ENCOUNTER — Inpatient Hospital Stay: Payer: BC Managed Care – PPO

## 2021-07-15 DIAGNOSIS — R112 Nausea with vomiting, unspecified: Secondary | ICD-10-CM

## 2021-07-15 MED ORDER — ONDANSETRON HCL 8 MG PO TABS: 8.0000 mg | ORAL_TABLET | Freq: Three times a day (TID) | ORAL | 3 refills | Status: DC | PRN

## 2021-07-15 NOTE — Telephone Encounter (Signed)
Case: [id number] Start: 06/15/21 through 01/11/22 Zofran 8mg  Quantity override approved

## 2021-07-15 NOTE — Telephone Encounter (Addendum)
I have spoken with pt and advised of this. Pt expressed understanding of this information.

## 2021-07-22 ENCOUNTER — Ambulatory Visit: Payer: BC Managed Care – PPO | Admitting: Pulmonary Disease

## 2021-07-22 ENCOUNTER — Other Ambulatory Visit: Payer: Self-pay

## 2021-07-22 ENCOUNTER — Other Ambulatory Visit: Payer: Self-pay | Admitting: Medical Oncology

## 2021-07-22 ENCOUNTER — Inpatient Hospital Stay: Payer: BC Managed Care – PPO

## 2021-07-22 ENCOUNTER — Encounter: Payer: Self-pay | Admitting: Internal Medicine

## 2021-07-22 ENCOUNTER — Inpatient Hospital Stay: Payer: BC Managed Care – PPO | Attending: Internal Medicine

## 2021-07-22 DIAGNOSIS — Z79891 Long term (current) use of opiate analgesic: Secondary | ICD-10-CM | POA: Diagnosis not present

## 2021-07-22 DIAGNOSIS — R590 Localized enlarged lymph nodes: Secondary | ICD-10-CM | POA: Diagnosis not present

## 2021-07-22 DIAGNOSIS — R5383 Other fatigue: Secondary | ICD-10-CM | POA: Diagnosis not present

## 2021-07-22 DIAGNOSIS — R630 Anorexia: Secondary | ICD-10-CM | POA: Insufficient documentation

## 2021-07-22 DIAGNOSIS — C770 Secondary and unspecified malignant neoplasm of lymph nodes of head, face and neck: Secondary | ICD-10-CM | POA: Insufficient documentation

## 2021-07-22 DIAGNOSIS — Z79899 Other long term (current) drug therapy: Secondary | ICD-10-CM | POA: Diagnosis not present

## 2021-07-22 DIAGNOSIS — C3481 Malignant neoplasm of overlapping sites of right bronchus and lung: Secondary | ICD-10-CM | POA: Diagnosis not present

## 2021-07-22 DIAGNOSIS — Z9221 Personal history of antineoplastic chemotherapy: Secondary | ICD-10-CM | POA: Diagnosis not present

## 2021-07-22 DIAGNOSIS — Z923 Personal history of irradiation: Secondary | ICD-10-CM | POA: Insufficient documentation

## 2021-07-22 DIAGNOSIS — R112 Nausea with vomiting, unspecified: Secondary | ICD-10-CM

## 2021-07-22 MED ORDER — ONDANSETRON HCL 40 MG/20ML IJ SOLN
8.0000 mg | Freq: Once | INTRAMUSCULAR | Status: AC
  Administered 2021-07-22: 8 mg via INTRAVENOUS
  Filled 2021-07-22: qty 4

## 2021-07-22 MED ORDER — SODIUM CHLORIDE 0.9 % IV SOLN: INTRAVENOUS | Status: DC

## 2021-07-22 NOTE — Telephone Encounter (Signed)
I spoke with pts wife for  clarity of this message. She states the pt has a HR =120 and a BP of 78/45 and wants to know if pt can get IVF at [facility].  Discussed this with Dr. Julien Nordmann who advised for pt to receive 1L IVF and 8mg  of Zofran.  Pt has been arranged to receive this at our [facility] location at 1pm today. I have spoken with pt wife and advised of this and she states they are on their way to this appt. Diane, RN has entered the orders.

## 2021-07-22 NOTE — Patient Instructions (Signed)
Dehydration, Adult Dehydration is condition in which there is not enough water or other fluids in the body. This happens when a person loses more fluids than he or she takes in. Important body parts cannot work right without the right amount of fluids. Any loss of fluids from the body can cause dehydration. Dehydration can be mild, worse, or very bad. It should be treated right away to keep it from getting very bad. What are the causes? This condition may be caused by: Conditions that cause loss of water or other fluids, such as: Watery poop (diarrhea). Vomiting. Sweating a lot. Peeing (urinating) a lot. Not drinking enough fluids, especially when you: Are ill. Are doing things that take a lot of energy to do. Other illnesses and conditions, such as fever or infection. Certain medicines, such as medicines that take extra fluid out of the body (diuretics). Lack of safe drinking water. Not being able to get enough water and food. What increases the risk? The following factors may make you more likely to develop this condition: Having a long-term (chronic) illness that has not been treated the right way, such as: Diabetes. Heart disease. Kidney disease. Being 36 years of age or older. Having a disability. Living in a place that is high above the ground or sea (high in altitude). The thinner, dried air causes more fluid loss. Doing exercises that put stress on your body for a long time. What are the signs or symptoms? Symptoms of dehydration depend on how bad it is. Mild or worse dehydration Thirst. Dry lips or dry mouth. Feeling dizzy or light-headed, especially when you stand up from sitting. Muscle cramps. Your body making: Dark pee (urine). Pee may be the color of tea. Less pee than normal. Less tears than normal. Headache. Very bad dehydration Changes in skin. Skin may: Be cold to the touch (clammy). Be blotchy or pale. Not go back to normal right after you lightly pinch  it and let it go. Little or no tears, pee, or sweat. Changes in vital signs, such as: Fast breathing. Low blood pressure. Weak pulse. Pulse that is more than 100 beats a minute when you are sitting still. Other changes, such as: Feeling very thirsty. Eyes that look hollow (sunken). Cold hands and feet. Being mixed up (confused). Being very tired (lethargic) or having trouble waking from sleep. Short-term weight loss. Loss of consciousness. How is this treated? Treatment for this condition depends on how bad it is. Treatment should start right away. Do not wait until your condition gets very bad. Very bad dehydration is an emergency. You will need to go to a hospital. Mild or worse dehydration can be treated at home. You may be asked to: Drink more fluids. Drink an oral rehydration solution (ORS). This drink helps get the right amounts of fluids and salts and minerals in the blood (electrolytes). Very bad dehydration can be treated: With fluids through an IV tube. By getting normal levels of salts and minerals in your blood. This is often done by giving salts and minerals through a tube. The tube is passed through your nose and into your stomach. By treating the root cause. Follow these instructions at home: Oral rehydration solution If told by your doctor, drink an ORS: Make an ORS. Use instructions on the package. Start by drinking small amounts, about  cup (120 mL) every 5-10 minutes. Slowly drink more until you have had the amount that your doctor said to have. Eating and drinking  Drink enough clear fluid to keep your pee pale yellow. If you were told to drink an ORS, finish the ORS first. Then, start slowly drinking other clear fluids. Drink fluids such as: Water. Do not drink only water. Doing that can make the salt (sodium) level in your body get too low. Water from ice chips you suck on. Fruit juice that you have added water to (diluted). Low-calorie sports  drinks. Eat foods that have the right amounts of salts and minerals, such as: Bananas. Oranges. Potatoes. Tomatoes. Spinach. Do not drink alcohol. Avoid: Drinks that have a lot of sugar. These include: High-calorie sports drinks. Fruit juice that you did not add water to. Soda. Caffeine. Foods that are greasy or have a lot of fat or sugar. General instructions Take over-the-counter and prescription medicines only as told by your doctor. Do not take salt tablets. Doing that can make the salt level in your body get too high. Return to your normal activities as told by your doctor. Ask your doctor what activities are safe for you. Keep all follow-up visits as told by your doctor. This is important. Contact a doctor if: You have pain in your belly (abdomen) and the pain: Gets worse. Stays in one place. You have a rash. You have a stiff neck. You get angry or annoyed (irritable) more easily than normal. You are more tired or have a harder time waking than normal. You feel: Weak or dizzy. Very thirsty. Get help right away if you have: Any symptoms of very bad dehydration. Symptoms of vomiting, such as: You cannot eat or drink without vomiting. Your vomiting gets worse or does not go away. Your vomit has blood or green stuff in it. Symptoms that get worse with treatment. A fever. A very bad headache. Problems with peeing or pooping (having a bowel movement), such as: Watery poop that gets worse or does not go away. Blood in your poop (stool). This may cause poop to look black and tarry. Not peeing in 6-8 hours. Peeing only a small amount of very dark pee in 6-8 hours. Trouble breathing. These symptoms may be an emergency. Do not wait to see if the symptoms will go away. Get medical help right away. Call your local emergency services (911 in the [place] Do not drive yourself to the hospital. Summary Dehydration is a condition in which there is not enough water or other fluids  in the body. This happens when a person loses more fluids than he or she takes in. Treatment for this condition depends on how bad it is. Treatment should be started right away. Do not wait until your condition gets very bad. Drink enough clear fluid to keep your pee pale yellow. If you were told to drink an oral rehydration solution (ORS), finish the ORS first. Then, start slowly drinking other clear fluids. Take over-the-counter and prescription medicines only as told by your doctor. Get help right away if you have any symptoms of very bad dehydration. This information is not intended to replace advice given to you by your health care provider. Make sure you discuss any questions you have with your health care provider. Document Revised: 06/14/2019 Document Reviewed: 06/14/2019 Elsevier Patient Education  [place].

## 2021-07-24 ENCOUNTER — Other Ambulatory Visit: Payer: Self-pay | Admitting: Family Medicine

## 2021-07-28 ENCOUNTER — Other Ambulatory Visit: Payer: Self-pay | Admitting: Internal Medicine

## 2021-07-29 MED FILL — Dexamethasone Sodium Phosphate Inj 100 MG/10ML: INTRAMUSCULAR | Qty: 1 | Status: AC

## 2021-07-30 ENCOUNTER — Other Ambulatory Visit: Payer: Self-pay

## 2021-07-30 ENCOUNTER — Encounter (HOSPITAL_COMMUNITY): Payer: Self-pay

## 2021-07-30 ENCOUNTER — Inpatient Hospital Stay (HOSPITAL_BASED_OUTPATIENT_CLINIC_OR_DEPARTMENT_OTHER): Payer: BC Managed Care – PPO | Admitting: Internal Medicine

## 2021-07-30 ENCOUNTER — Encounter: Payer: Self-pay | Admitting: Internal Medicine

## 2021-07-30 ENCOUNTER — Inpatient Hospital Stay: Payer: BC Managed Care – PPO

## 2021-07-30 ENCOUNTER — Inpatient Hospital Stay (HOSPITAL_COMMUNITY): Payer: BC Managed Care – PPO

## 2021-07-30 ENCOUNTER — Emergency Department (HOSPITAL_COMMUNITY): Payer: BC Managed Care – PPO

## 2021-07-30 ENCOUNTER — Inpatient Hospital Stay (HOSPITAL_COMMUNITY)
Admission: EM | Admit: 2021-07-30 | Discharge: 2021-08-18 | DRG: 871 | Disposition: A | Discharge status: Home Health | Payer: BC Managed Care – PPO | Attending: Internal Medicine | Admitting: Internal Medicine

## 2021-07-30 DIAGNOSIS — R6520 Severe sepsis without septic shock: Secondary | ICD-10-CM

## 2021-07-30 DIAGNOSIS — C7801 Secondary malignant neoplasm of right lung: Secondary | ICD-10-CM | POA: Diagnosis not present

## 2021-07-30 DIAGNOSIS — J9811 Atelectasis: Secondary | ICD-10-CM | POA: Diagnosis present

## 2021-07-30 DIAGNOSIS — E119 Type 2 diabetes mellitus without complications: Secondary | ICD-10-CM

## 2021-07-30 DIAGNOSIS — I428 Other cardiomyopathies: Secondary | ICD-10-CM | POA: Diagnosis not present

## 2021-07-30 DIAGNOSIS — C3411 Malignant neoplasm of upper lobe, right bronchus or lung: Secondary | ICD-10-CM

## 2021-07-30 DIAGNOSIS — A419 Sepsis, unspecified organism: Secondary | ICD-10-CM | POA: Diagnosis not present

## 2021-07-30 DIAGNOSIS — E039 Hypothyroidism, unspecified: Secondary | ICD-10-CM | POA: Diagnosis not present

## 2021-07-30 DIAGNOSIS — I82452 Acute embolism and thrombosis of left peroneal vein: Secondary | ICD-10-CM | POA: Diagnosis present

## 2021-07-30 DIAGNOSIS — J440 Chronic obstructive pulmonary disease with acute lower respiratory infection: Secondary | ICD-10-CM | POA: Diagnosis present

## 2021-07-30 DIAGNOSIS — E7800 Pure hypercholesterolemia, unspecified: Secondary | ICD-10-CM | POA: Diagnosis present

## 2021-07-30 DIAGNOSIS — E1122 Type 2 diabetes mellitus with diabetic chronic kidney disease: Secondary | ICD-10-CM | POA: Diagnosis present

## 2021-07-30 DIAGNOSIS — E1110 Type 2 diabetes mellitus with ketoacidosis without coma: Secondary | ICD-10-CM | POA: Diagnosis not present

## 2021-07-30 DIAGNOSIS — Z85110 Personal history of malignant carcinoid tumor of bronchus and lung: Secondary | ICD-10-CM | POA: Diagnosis not present

## 2021-07-30 DIAGNOSIS — Z923 Personal history of irradiation: Secondary | ICD-10-CM

## 2021-07-30 DIAGNOSIS — N179 Acute kidney failure, unspecified: Secondary | ICD-10-CM | POA: Diagnosis present

## 2021-07-30 DIAGNOSIS — R627 Adult failure to thrive: Secondary | ICD-10-CM | POA: Diagnosis present

## 2021-07-30 DIAGNOSIS — R911 Solitary pulmonary nodule: Secondary | ICD-10-CM | POA: Diagnosis not present

## 2021-07-30 DIAGNOSIS — C3491 Malignant neoplasm of unspecified part of right bronchus or lung: Secondary | ICD-10-CM | POA: Diagnosis not present

## 2021-07-30 DIAGNOSIS — J154 Pneumonia due to other streptococci: Secondary | ICD-10-CM | POA: Diagnosis not present

## 2021-07-30 DIAGNOSIS — Y95 Nosocomial condition: Secondary | ICD-10-CM | POA: Diagnosis not present

## 2021-07-30 DIAGNOSIS — C3490 Malignant neoplasm of unspecified part of unspecified bronchus or lung: Secondary | ICD-10-CM | POA: Diagnosis not present

## 2021-07-30 DIAGNOSIS — E860 Dehydration: Secondary | ICD-10-CM | POA: Diagnosis present

## 2021-07-30 DIAGNOSIS — J9819 Other pulmonary collapse: Secondary | ICD-10-CM | POA: Diagnosis not present

## 2021-07-30 DIAGNOSIS — R5383 Other fatigue: Secondary | ICD-10-CM | POA: Diagnosis present

## 2021-07-30 DIAGNOSIS — R0602 Shortness of breath: Secondary | ICD-10-CM

## 2021-07-30 DIAGNOSIS — I4891 Unspecified atrial fibrillation: Secondary | ICD-10-CM | POA: Diagnosis not present

## 2021-07-30 DIAGNOSIS — I517 Cardiomegaly: Secondary | ICD-10-CM | POA: Diagnosis not present

## 2021-07-30 DIAGNOSIS — Z87442 Personal history of urinary calculi: Secondary | ICD-10-CM

## 2021-07-30 DIAGNOSIS — R0603 Acute respiratory distress: Secondary | ICD-10-CM | POA: Diagnosis present

## 2021-07-30 DIAGNOSIS — N1831 Chronic kidney disease, stage 3a: Secondary | ICD-10-CM | POA: Diagnosis present

## 2021-07-30 DIAGNOSIS — Z5111 Encounter for antineoplastic chemotherapy: Secondary | ICD-10-CM

## 2021-07-30 DIAGNOSIS — Z87891 Personal history of nicotine dependence: Secondary | ICD-10-CM

## 2021-07-30 DIAGNOSIS — Z791 Long term (current) use of non-steroidal anti-inflammatories (NSAID): Secondary | ICD-10-CM

## 2021-07-30 DIAGNOSIS — J939 Pneumothorax, unspecified: Secondary | ICD-10-CM | POA: Diagnosis not present

## 2021-07-30 DIAGNOSIS — Z95828 Presence of other vascular implants and grafts: Secondary | ICD-10-CM

## 2021-07-30 DIAGNOSIS — Z20822 Contact with and (suspected) exposure to covid-19: Secondary | ICD-10-CM | POA: Diagnosis present

## 2021-07-30 DIAGNOSIS — D638 Anemia in other chronic diseases classified elsewhere: Secondary | ICD-10-CM | POA: Diagnosis not present

## 2021-07-30 DIAGNOSIS — Z515 Encounter for palliative care: Secondary | ICD-10-CM | POA: Diagnosis not present

## 2021-07-30 DIAGNOSIS — C7802 Secondary malignant neoplasm of left lung: Secondary | ICD-10-CM | POA: Diagnosis present

## 2021-07-30 DIAGNOSIS — Z23 Encounter for immunization: Secondary | ICD-10-CM

## 2021-07-30 DIAGNOSIS — J189 Pneumonia, unspecified organism: Secondary | ICD-10-CM | POA: Diagnosis not present

## 2021-07-30 DIAGNOSIS — R1310 Dysphagia, unspecified: Secondary | ICD-10-CM | POA: Diagnosis present

## 2021-07-30 DIAGNOSIS — Z9689 Presence of other specified functional implants: Secondary | ICD-10-CM

## 2021-07-30 DIAGNOSIS — R42 Dizziness and giddiness: Secondary | ICD-10-CM | POA: Diagnosis not present

## 2021-07-30 DIAGNOSIS — J90 Pleural effusion, not elsewhere classified: Secondary | ICD-10-CM | POA: Diagnosis present

## 2021-07-30 DIAGNOSIS — Z7982 Long term (current) use of aspirin: Secondary | ICD-10-CM

## 2021-07-30 DIAGNOSIS — Z79890 Hormone replacement therapy (postmenopausal): Secondary | ICD-10-CM

## 2021-07-30 DIAGNOSIS — B954 Other streptococcus as the cause of diseases classified elsewhere: Secondary | ICD-10-CM | POA: Diagnosis present

## 2021-07-30 DIAGNOSIS — R918 Other nonspecific abnormal finding of lung field: Secondary | ICD-10-CM | POA: Diagnosis not present

## 2021-07-30 DIAGNOSIS — K219 Gastro-esophageal reflux disease without esophagitis: Secondary | ICD-10-CM | POA: Diagnosis present

## 2021-07-30 DIAGNOSIS — J869 Pyothorax without fistula: Secondary | ICD-10-CM | POA: Diagnosis not present

## 2021-07-30 DIAGNOSIS — J918 Pleural effusion in other conditions classified elsewhere: Secondary | ICD-10-CM | POA: Diagnosis not present

## 2021-07-30 DIAGNOSIS — E441 Mild protein-calorie malnutrition: Secondary | ICD-10-CM | POA: Diagnosis not present

## 2021-07-30 DIAGNOSIS — Z7984 Long term (current) use of oral hypoglycemic drugs: Secondary | ICD-10-CM

## 2021-07-30 DIAGNOSIS — E440 Moderate protein-calorie malnutrition: Secondary | ICD-10-CM | POA: Diagnosis present

## 2021-07-30 DIAGNOSIS — Z7985 Long-term (current) use of injectable non-insulin antidiabetic drugs: Secondary | ICD-10-CM

## 2021-07-30 DIAGNOSIS — Z888 Allergy status to other drugs, medicaments and biological substances status: Secondary | ICD-10-CM

## 2021-07-30 DIAGNOSIS — J948 Other specified pleural conditions: Secondary | ICD-10-CM

## 2021-07-30 DIAGNOSIS — Z4682 Encounter for fitting and adjustment of non-vascular catheter: Secondary | ICD-10-CM | POA: Diagnosis not present

## 2021-07-30 DIAGNOSIS — I82462 Acute embolism and thrombosis of left calf muscular vein: Secondary | ICD-10-CM | POA: Diagnosis present

## 2021-07-30 DIAGNOSIS — L8990 Pressure ulcer of unspecified site, unspecified stage: Secondary | ICD-10-CM | POA: Insufficient documentation

## 2021-07-30 DIAGNOSIS — Z79899 Other long term (current) drug therapy: Secondary | ICD-10-CM

## 2021-07-30 DIAGNOSIS — R091 Pleurisy: Secondary | ICD-10-CM | POA: Diagnosis not present

## 2021-07-30 DIAGNOSIS — M7989 Other specified soft tissue disorders: Secondary | ICD-10-CM | POA: Diagnosis not present

## 2021-07-30 DIAGNOSIS — R638 Other symptoms and signs concerning food and fluid intake: Secondary | ICD-10-CM | POA: Diagnosis not present

## 2021-07-30 DIAGNOSIS — D649 Anemia, unspecified: Secondary | ICD-10-CM | POA: Diagnosis not present

## 2021-07-30 DIAGNOSIS — Z6826 Body mass index (BMI) 26.0-26.9, adult: Secondary | ICD-10-CM

## 2021-07-30 DIAGNOSIS — E876 Hypokalemia: Secondary | ICD-10-CM | POA: Diagnosis present

## 2021-07-30 DIAGNOSIS — Z7189 Other specified counseling: Secondary | ICD-10-CM | POA: Diagnosis not present

## 2021-07-30 DIAGNOSIS — R634 Abnormal weight loss: Secondary | ICD-10-CM

## 2021-07-30 LAB — GLUCOSE, PLEURAL OR PERITONEAL FLUID: Glucose, Fluid: <20 mg/dL

## 2021-07-30 LAB — CREATININE, SERUM
Creatinine, Ser: 1.5 mg/dL — ABNORMAL HIGH (ref 0.61–1.24)
GFR, Estimated: 53 mL/min — ABNORMAL LOW (ref >60)

## 2021-07-30 LAB — LACTATE DEHYDROGENASE, PLEURAL OR PERITONEAL FLUID: LD, Fluid: 2117 U/L — ABNORMAL HIGH (ref 3–23)

## 2021-07-30 LAB — CBC
HCT: 25.9 % — ABNORMAL LOW (ref 39.0–52.0)
Hemoglobin: 8.3 g/dL — ABNORMAL LOW (ref 13.0–17.0)
MCH: 28.6 pg (ref 26.0–34.0)
MCHC: 32 g/dL (ref 30.0–36.0)
MCV: 89.3 fL (ref 80.0–100.0)
Platelets: 217 10*3/uL (ref 150–400)
RBC: 2.9 MIL/uL — ABNORMAL LOW (ref 4.22–5.81)
RDW: 20.6 % — ABNORMAL HIGH (ref 11.5–15.5)
WBC: 35.1 10*3/uL — ABNORMAL HIGH (ref 4.0–10.5)
nRBC: 0.4 % — ABNORMAL HIGH (ref 0.0–0.2)

## 2021-07-30 LAB — PROTEIN, TOTAL: Total Protein: 7.4 g/dL (ref 6.5–8.1)

## 2021-07-30 LAB — CBC WITH DIFFERENTIAL (CANCER CENTER ONLY)
Abs Immature Granulocytes: 0.68 10*3/uL — ABNORMAL HIGH (ref 0.00–0.07)
Basophils Absolute: 0.1 10*3/uL (ref 0.0–0.1)
Basophils Relative: 0 %
Eosinophils Absolute: 0 10*3/uL (ref 0.0–0.5)
Eosinophils Relative: 0 %
HCT: 28.3 % — ABNORMAL LOW (ref 39.0–52.0)
Hemoglobin: 9.2 g/dL — ABNORMAL LOW (ref 13.0–17.0)
Immature Granulocytes: 2 %
Lymphocytes Relative: 1 %
Lymphs Abs: 0.6 10*3/uL — ABNORMAL LOW (ref 0.7–4.0)
MCH: 28.5 pg (ref 26.0–34.0)
MCHC: 32.5 g/dL (ref 30.0–36.0)
MCV: 87.6 fL (ref 80.0–100.0)
Monocytes Absolute: 1.4 10*3/uL — ABNORMAL HIGH (ref 0.1–1.0)
Monocytes Relative: 3 %
Neutro Abs: 41.6 10*3/uL — ABNORMAL HIGH (ref 1.7–7.7)
Neutrophils Relative %: 94 %
Platelet Count: 530 10*3/uL — ABNORMAL HIGH (ref 150–400)
RBC: 3.23 MIL/uL — ABNORMAL LOW (ref 4.22–5.81)
RDW: 19.8 % — ABNORMAL HIGH (ref 11.5–15.5)
WBC Count: 44.5 10*3/uL — ABNORMAL HIGH (ref 4.0–10.5)
nRBC: 0.1 % (ref 0.0–0.2)

## 2021-07-30 LAB — BLOOD GAS, VENOUS
Acid-base deficit: 5.5 mmol/L — ABNORMAL HIGH (ref 0.0–2.0)
Bicarbonate: 18.6 mmol/L — ABNORMAL LOW (ref 20.0–28.0)
O2 Saturation: 86.8 %
Patient temperature: 98.6
pCO2, Ven: 33.2 mmHg — ABNORMAL LOW (ref 44.0–60.0)
pH, Ven: 7.369 (ref 7.250–7.430)
pO2, Ven: 62 mmHg — ABNORMAL HIGH (ref 32.0–45.0)

## 2021-07-30 LAB — BASIC METABOLIC PANEL
Anion gap: 11 (ref 5–15)
Anion gap: 13 (ref 5–15)
BUN: 73 mg/dL — ABNORMAL HIGH (ref 6–20)
BUN: 69 mg/dL — ABNORMAL HIGH (ref 6–20)
CO2: 20 mmol/L — ABNORMAL LOW (ref 22–32)
CO2: 19 mmol/L — ABNORMAL LOW (ref 22–32)
Calcium: 9.6 mg/dL (ref 8.9–10.3)
Calcium: 9.3 mg/dL (ref 8.9–10.3)
Chloride: 108 mmol/L (ref 98–111)
Chloride: 104 mmol/L (ref 98–111)
Creatinine, Ser: 1.62 mg/dL — ABNORMAL HIGH (ref 0.61–1.24)
Creatinine, Ser: 1.48 mg/dL — ABNORMAL HIGH (ref 0.61–1.24)
GFR, Estimated: 48 mL/min — ABNORMAL LOW (ref >60)
GFR, Estimated: 54 mL/min — ABNORMAL LOW (ref >60)
Glucose, Bld: 309 mg/dL — ABNORMAL HIGH (ref 70–99)
Glucose, Bld: 143 mg/dL — ABNORMAL HIGH (ref 70–99)
Potassium: 5.7 mmol/L — ABNORMAL HIGH (ref 3.5–5.1)
Potassium: 4.8 mmol/L (ref 3.5–5.1)
Sodium: 139 mmol/L (ref 135–145)
Sodium: 136 mmol/L (ref 135–145)

## 2021-07-30 LAB — BODY FLUID CELL COUNT WITH DIFFERENTIAL
Eos, Fluid: 0 %
Lymphs, Fluid: 0 %
Monocyte-Macrophage-Serous Fluid: 0 % — ABNORMAL LOW (ref 50–90)
Neutrophil Count, Fluid: 100 % — ABNORMAL HIGH (ref 0–25)
Total Nucleated Cell Count, Fluid: 189105 cu mm — ABNORMAL HIGH (ref 0–1000)

## 2021-07-30 LAB — CMP (CANCER CENTER ONLY)
ALT: 16 U/L (ref 0–44)
AST: 20 U/L (ref 15–41)
Albumin: 1.9 g/dL — ABNORMAL LOW (ref 3.5–5.0)
Alkaline Phosphatase: 216 U/L — ABNORMAL HIGH (ref 38–126)
Anion gap: 17 — ABNORMAL HIGH (ref 5–15)
BUN: 72 mg/dL — ABNORMAL HIGH (ref 6–20)
CO2: 17 mmol/L — ABNORMAL LOW (ref 22–32)
Calcium: 10.8 mg/dL — ABNORMAL HIGH (ref 8.9–10.3)
Chloride: 101 mmol/L (ref 98–111)
Creatinine: 2.07 mg/dL — ABNORMAL HIGH (ref 0.61–1.24)
GFR, Estimated: 36 mL/min — ABNORMAL LOW (ref >60)
Glucose, Bld: 373 mg/dL — ABNORMAL HIGH (ref 70–99)
Potassium: 5.5 mmol/L — ABNORMAL HIGH (ref 3.5–5.1)
Sodium: 135 mmol/L (ref 135–145)
Total Bilirubin: 0.4 mg/dL (ref 0.3–1.2)
Total Protein: 8 g/dL (ref 6.5–8.1)

## 2021-07-30 LAB — POC OCCULT BLOOD, ED: Fecal Occult Bld: NEGATIVE

## 2021-07-30 LAB — TSH: TSH: 1.862 u[IU]/mL (ref 0.320–4.118)

## 2021-07-30 LAB — GLUCOSE, CAPILLARY
Glucose-Capillary: 120 mg/dL — ABNORMAL HIGH (ref 70–99)
Glucose-Capillary: 150 mg/dL — ABNORMAL HIGH (ref 70–99)

## 2021-07-30 LAB — CBG MONITORING, ED
Glucose-Capillary: 280 mg/dL — ABNORMAL HIGH (ref 70–99)
Glucose-Capillary: 207 mg/dL — ABNORMAL HIGH (ref 70–99)
Glucose-Capillary: 135 mg/dL — ABNORMAL HIGH (ref 70–99)

## 2021-07-30 LAB — RESP PANEL BY RT-PCR (FLU A&B, COVID) ARPGX2
Influenza A by PCR: NEGATIVE
Influenza B by PCR: NEGATIVE
SARS Coronavirus 2 by RT PCR: NEGATIVE

## 2021-07-30 LAB — LACTIC ACID, PLASMA
Lactic Acid, Venous: 1.2 mmol/L (ref 0.5–1.9)
Lactic Acid, Venous: 2.1 mmol/L (ref 0.5–1.9)

## 2021-07-30 LAB — LACTATE DEHYDROGENASE: LDH: 128 U/L (ref 98–192)

## 2021-07-30 LAB — BETA-HYDROXYBUTYRIC ACID
Beta-Hydroxybutyric Acid: 0.67 mmol/L — ABNORMAL HIGH (ref 0.05–0.27)
Beta-Hydroxybutyric Acid: 0.15 mmol/L (ref 0.05–0.27)

## 2021-07-30 LAB — ABO/RH: ABO/RH(D): A NEG

## 2021-07-30 MED ORDER — ACETAMINOPHEN 650 MG RE SUPP: 650.0000 mg | Freq: Four times a day (QID) | RECTAL | Status: DC | PRN

## 2021-07-30 MED ORDER — FENTANYL CITRATE PF 50 MCG/ML IJ SOSY
50.0000 ug | PREFILLED_SYRINGE | Freq: Once | INTRAMUSCULAR | Status: AC
  Administered 2021-07-30: 50 ug via INTRAVENOUS
  Filled 2021-07-30: qty 1

## 2021-07-30 MED ORDER — SODIUM CHLORIDE 0.9 % IV SOLN
2.0000 g | Freq: Two times a day (BID) | INTRAVENOUS | Status: DC
  Administered 2021-07-30: 2 g via INTRAVENOUS
  Filled 2021-07-30: qty 2

## 2021-07-30 MED ORDER — DEXTROSE IN LACTATED RINGERS 5 % IV SOLN: INTRAVENOUS | Status: DC

## 2021-07-30 MED ORDER — PHENOL 1.4 % MT LIQD
1.0000 | OROMUCOSAL | Status: DC | PRN
  Administered 2021-07-30: 1 via OROMUCOSAL
  Filled 2021-07-30: qty 177

## 2021-07-30 MED ORDER — LACTATED RINGERS IV BOLUS
1000.0000 mL | Freq: Once | INTRAVENOUS | Status: AC
  Administered 2021-07-30: 1000 mL via INTRAVENOUS

## 2021-07-30 MED ORDER — REVEFENACIN 175 MCG/3ML IN SOLN
175.0000 ug | Freq: Every day | RESPIRATORY_TRACT | Status: DC
  Administered 2021-07-31 – 2021-08-18 (×18): 175 ug via RESPIRATORY_TRACT
  Filled 2021-07-30 (×20): qty 3

## 2021-07-30 MED ORDER — LACTATED RINGERS IV SOLN: INTRAVENOUS | Status: DC

## 2021-07-30 MED ORDER — LACTATED RINGERS IV BOLUS
20.0000 mL/kg | Freq: Once | INTRAVENOUS | Status: AC
  Administered 2021-07-30: 1000 mL via INTRAVENOUS

## 2021-07-30 MED ORDER — INSULIN REGULAR(HUMAN) IN NACL 100-0.9 UT/100ML-% IV SOLN
INTRAVENOUS | Status: DC
  Administered 2021-07-30: 14 [IU]/h via INTRAVENOUS
  Filled 2021-07-30: qty 100

## 2021-07-30 MED ORDER — ENOXAPARIN SODIUM 40 MG/0.4ML IJ SOSY: 40.0000 mg | PREFILLED_SYRINGE | INTRAMUSCULAR | Status: DC

## 2021-07-30 MED ORDER — OXYCODONE HCL 5 MG PO TABS
5.0000 mg | ORAL_TABLET | Freq: Three times a day (TID) | ORAL | Status: AC | PRN
  Administered 2021-07-30 – 2021-07-31 (×2): 5 mg via ORAL
  Filled 2021-07-30 (×2): qty 1

## 2021-07-30 MED ORDER — ACETAMINOPHEN 325 MG PO TABS
650.0000 mg | ORAL_TABLET | Freq: Four times a day (QID) | ORAL | Status: DC | PRN
  Administered 2021-08-06 – 2021-08-09 (×2): 650 mg via ORAL
  Filled 2021-07-30 (×2): qty 2

## 2021-07-30 MED ORDER — VANCOMYCIN HCL 1250 MG/250ML IV SOLN: 1250.0000 mg | INTRAVENOUS | Status: DC

## 2021-07-30 MED ORDER — ENOXAPARIN SODIUM 40 MG/0.4ML IJ SOSY
40.0000 mg | PREFILLED_SYRINGE | INTRAMUSCULAR | Status: DC
  Administered 2021-07-31 – 2021-08-16 (×17): 40 mg via SUBCUTANEOUS
  Filled 2021-07-30 (×18): qty 0.4

## 2021-07-30 MED ORDER — ONDANSETRON HCL 4 MG PO TABS: 4.0000 mg | ORAL_TABLET | Freq: Four times a day (QID) | ORAL | Status: DC | PRN

## 2021-07-30 MED ORDER — LACTATED RINGERS IV SOLN
INTRAVENOUS | Status: DC
Start: 2021-07-30 — End: 2021-08-01

## 2021-07-30 MED ORDER — SODIUM CHLORIDE 0.9% FLUSH
10.0000 mL | Freq: Once | INTRAVENOUS | Status: AC
  Administered 2021-07-30: 10 mL

## 2021-07-30 MED ORDER — INFLUENZA VAC SPLIT QUAD 0.5 ML IM SUSY
0.5000 mL | PREFILLED_SYRINGE | INTRAMUSCULAR | Status: AC
  Administered 2021-07-31: 0.5 mL via INTRAMUSCULAR
  Filled 2021-07-30: qty 0.5

## 2021-07-30 MED ORDER — METRONIDAZOLE 500 MG/100ML IV SOLN
500.0000 mg | Freq: Two times a day (BID) | INTRAVENOUS | Status: DC
  Administered 2021-07-31 – 2021-08-03 (×8): 500 mg via INTRAVENOUS
  Filled 2021-07-30 (×8): qty 100

## 2021-07-30 MED ORDER — VANCOMYCIN HCL 1750 MG/350ML IV SOLN
1750.0000 mg | Freq: Once | INTRAVENOUS | Status: AC
  Administered 2021-07-30: 1750 mg via INTRAVENOUS
  Filled 2021-07-30: qty 350

## 2021-07-30 MED ORDER — CHLORHEXIDINE GLUCONATE CLOTH 2 % EX PADS
6.0000 | MEDICATED_PAD | Freq: Every day | CUTANEOUS | Status: DC
  Administered 2021-08-01 – 2021-08-17 (×13): 6 via TOPICAL

## 2021-07-30 MED ORDER — BUDESON-GLYCOPYRROL-FORMOTEROL 160-9-4.8 MCG/ACT IN AERO: 2.0000 | INHALATION_SPRAY | Freq: Two times a day (BID) | RESPIRATORY_TRACT | Status: DC

## 2021-07-30 MED ORDER — DEXTROSE 50 % IV SOLN: 0.0000 mL | INTRAVENOUS | Status: DC | PRN

## 2021-07-30 MED ORDER — ALBUTEROL SULFATE (2.5 MG/3ML) 0.083% IN NEBU: 2.5000 mg | INHALATION_SOLUTION | Freq: Four times a day (QID) | RESPIRATORY_TRACT | Status: DC | PRN

## 2021-07-30 MED ORDER — ONDANSETRON HCL 4 MG/2ML IJ SOLN
4.0000 mg | Freq: Four times a day (QID) | INTRAMUSCULAR | Status: DC | PRN
  Administered 2021-08-03: 4 mg via INTRAVENOUS
  Filled 2021-07-30 (×2): qty 2

## 2021-07-30 MED ORDER — DILTIAZEM HCL-DEXTROSE 125-5 MG/125ML-% IV SOLN (PREMIX)
5.0000 mg/h | INTRAVENOUS | Status: DC
  Administered 2021-07-30: 12.5 mg/h via INTRAVENOUS
  Administered 2021-07-30: 5 mg/h via INTRAVENOUS
  Administered 2021-07-30: 10 mg/h via INTRAVENOUS
  Administered 2021-07-30: 5 mg/h via INTRAVENOUS
  Administered 2021-07-30: 15 mg/h via INTRAVENOUS
  Administered 2021-07-31: 5 mg/h via INTRAVENOUS
  Filled 2021-07-30 (×2): qty 125

## 2021-07-30 NOTE — ED Notes (Signed)
Insulin infusion will be decreased to 6 units/hr based on endo tool

## 2021-07-30 NOTE — Procedures (Signed)
Thoracentesis  Procedure Note  William Farrell  [id number]  [birth year]  Date:07/30/21  Time:5:42 PM   Provider Performing:Brooke Moshe Cipro; Dr. Verlee Monte at bedside.   Procedure: Thoracentesis with imaging guidance (32122)  Indication(s) Pleural Effusion, Right    Consent Risks of the procedure as well as the alternatives and risks of each were explained to the patient and/or caregiver.  Consent for the procedure was obtained and is signed in the bedside chart  Anesthesia Topical only with 1% lidocaine    Time Out Verified patient identification, verified procedure, site/side was marked, verified correct patient position, special equipment/implants available, medications/allergies/relevant history reviewed, required imaging and test results available.   Sterile Technique Maximal sterile technique including full sterile barrier drape, hand hygiene, sterile gown, sterile gloves, mask, hair covering, sterile ultrasound probe cover (if used).  Procedure Description Ultrasound was used to identify appropriate pleural anatomy for placement and overlying skin marked.  Area of drainage cleaned and draped in sterile fashion. Lidocaine was used to anesthetize the skin and subcutaneous tissue.  60 cc's of purulent appearing foul smelling fluid was drained from the right pleural space. Catheter then removed.  Given appearance of pleural fluid and foul smelling, no further drainage was obtained and procedure changed to chest tube insertion after consent.  Complications/Tolerance None; patient tolerated the procedure well. Chest X-ray is ordered to confirm no post-procedural complication.   EBL Minimal   Specimen(s) Pleural fluid      William Farrell, ACNP [facility] 07/30/2021, 5:46 PM  See Amion for pager If no response to pager, please call PCCM consult pager After 7:00 pm call [facility]

## 2021-07-30 NOTE — ED Notes (Signed)
Discussed with hospitalist, pulm at the  bedside. Holding pt's medications at this time, will give after pulm has completed paracentesis.

## 2021-07-30 NOTE — Consult Note (Signed)
NAME:  William Farrell, MRN:  [id number], DOB:  1961-11-08, LOS: 0 ADMISSION DATE:  07/30/2021, CONSULTATION DATE:  07/30/2021 REFERRING MD:  Dr. Marylyn Ishihara, CHIEF COMPLAINT:  SOB/ weakness   History of Present Illness:   60 year old male with prior history of Stage IIIa NSCLC (SCC) RUL diagnosed 02/2020 (RUL/suprahilar mass and R paratracheal and subcarinal LAD) s/p chemo XRT including keytruda, consolidation IT with durvalumab, SBRT to RUL nodules followed by Dr. Julien Nordmann. He is receiving neulasta support.  Presented to Dr. Julien Nordmann for follow-up, reportedly not felt well for 1-2 weeks, 15 lb weight loss since his prior visit, pain under right breast, worse with cough and progressive dyspnea on exertion even on his home O2.  He has had productive cough, orthopnea that has worsened over the last week. He has borrowed his wife's oxygen. A little hemoptysis, but no nausea, vomiting, or diarrhea.  No fever/chills.  He does acknowledge some trouble with aspiration of solids/liquids. Was due to start cycle #12 today.    Pertinent  Medical History  NSCLC stage IIIA (dx 02/2020),   Significant Hospital Events: Including procedures, antibiotic start and stop dates in addition to other pertinent events   07/30/21 admitted, thora, plan on repeat ct chest  Interim History / Subjective:  N/a  Objective   Blood pressure 109/76, pulse 91, temperature 98.7 F (37.1 C), temperature source Rectal, resp. rate 16, height 6' (1.829 m), weight 88.9 kg, SpO2 98 %.       No intake or output data in the 24 hours ending 07/30/21 1549 Filed Weights   07/30/21 1101 07/30/21 1133  Weight: 95.4 kg 88.9 kg    Examination: General appearance: 60 y.o., male, NAD, conversant, pale and chronically ill appearing Eyes: anicteric sclerae, moist conjunctivae; no lid-lag; PERRL, tracking appropriately HENT: NCAT; dry MM Neck: Trachea midline; no palpable lymphadenopathy, no JVD Lungs: diminished R>L CV: borderline  tachycardic RR, no MRGs  Abdomen: Soft, non-tender; non-distended, BS present  Extremities: trace peripheral edema, radial and DP pulses present bilaterally  Skin: Normal temperature, turgor and texture; no rash Psych: Appropriate affect Neuro: Alert and oriented to person and place, no focal deficit    Resolved Hospital Problem list   N/a  Assessment & Plan:   # right pleural effusion # atelectasis Empyema vs cholesterol/TG effusion. WIth concomitant volume loss I also worry about proximal airway obstruction whether  by tumor, mucus, or (less likely) aspirated food/liquids.  - converted to pigtail chest tube after thoracentesis fluid appeared purulent and was foul smelling - sent fluid for usual studies as well as TG, cholesterol - ABX per primary but would include anaerobic coverage at a minimum - CXR now and tomorrow morning - keep to suction -20 - will eventually need repeat CT Chest  # AF/RVR - dilt and AC per primary and cardiology   # COPD  - would recommend yupelri and prn albuterol until HR under better control.  # DKA # DM2  - insulin gtt/electrolyte checks per primary team    Best Practice (right click and "Reselect all SmartList Selections" daily)   Per [facility]  Labs   CBC: Recent Labs  Lab 07/30/21 0927  WBC 44.5*  NEUTROABS 41.6*  HGB 9.2*  HCT 28.3*  MCV 87.6  PLT 530*    Basic Metabolic Panel: Recent Labs  Lab 07/30/21 0927  NA 135  K 5.5*  CL 101  CO2 17*  GLUCOSE 373*  BUN 72*  CREATININE 2.07*  CALCIUM 10.8*  GFR: Estimated Creatinine Clearance: 41.7 mL/min (A) (by C-G formula based on SCr of 2.07 mg/dL (H)). Recent Labs  Lab 07/30/21 0927  WBC 44.5*    Liver Function Tests: Recent Labs  Lab 07/30/21 0927  AST 20  ALT 16  ALKPHOS 216*  BILITOT 0.4  PROT 8.0  ALBUMIN 1.9*   No results for input(s): LIPASE, AMYLASE in the last 168 hours. No results for input(s): AMMONIA in the last 168 hours.  ABG No results found  for: PHART, PCO2ART, PO2ART, HCO3, TCO2, ACIDBASEDEF, O2SAT   Coagulation Profile: No results for input(s): INR, PROTIME in the last 168 hours.  Cardiac Enzymes: No results for input(s): CKTOTAL, CKMB, CKMBINDEX, TROPONINI in the last 168 hours.  HbA1C: Hemoglobin A1C  Date/Time Value Ref Range Status  06/10/2021 08:32 AM 6.8 (A) 4.0 - 5.6 % Final  03/11/2021 08:37 AM 7.5 (A) 4.0 - 5.6 % Final   Hgb A1c MFr Bld  Date/Time Value Ref Range Status  01/14/2020 08:59 AM 7.1 (H) <5.7 % of total Hgb Final    Comment:    For someone without known diabetes, a hemoglobin A1c value of 6.5% or greater indicates that they may have  diabetes and this should be confirmed with a follow-up  test. . For someone with known diabetes, a value <7% indicates  that their diabetes is well controlled and a value  greater than or equal to 7% indicates suboptimal  control. A1c targets should be individualized based on  duration of diabetes, age, comorbid conditions, and  other considerations. . Currently, no consensus exists regarding use of hemoglobin A1c for diagnosis of diabetes for children. .     CBG: No results for input(s): GLUCAP in the last 168 hours.  Review of Systems:   Negative except as in HPI  Past Medical History:  He,  has a past medical history of Asthma, Diabetes ([place]), Diverticulitis, GERD (gastroesophageal reflux disease), High cholesterol, History of kidney stones, Lung cancer ([place]), and Wears glasses.   Surgical History:   Past Surgical History:  Procedure Laterality Date   BIOPSY  03/25/2021   Procedure: BIOPSY;  Surgeon: Lavena Bullion, DO;  Location: [facility];  Service: Gastroenterology;;   BIOPSY  06/11/2021   Procedure: BIOPSY;  Surgeon: Lavena Bullion, DO;  Location: [facility];  Service: Gastroenterology;;   broken bone repair     BRONCHIAL BIOPSY  03/11/2020   Procedure: BRONCHIAL BIOPSIES;  Surgeon: Garner Nash, DO;  Location: [facility];   Service: Pulmonary;;   COLONOSCOPY     around 49[place]    CRYOTHERAPY  03/11/2020   Procedure: CRYOTHERAPY;  Surgeon: Garner Nash, DO;  Location: [facility];  Service: Pulmonary;;   ESOPHAGEAL DILATION  03/25/2021   Procedure: ESOPHAGEAL DILATION;  Surgeon: Lavena Bullion, DO;  Location: [facility];  Service: Gastroenterology;;   ESOPHAGOGASTRODUODENOSCOPY (EGD) WITH PROPOFOL N/A 03/25/2021   Procedure: ESOPHAGOGASTRODUODENOSCOPY (EGD) WITH PROPOFOL;  Surgeon: Lavena Bullion, DO;  Location: [facility];  Service: Gastroenterology;  Laterality: N/A;  dialation of stricture   ESOPHAGOGASTRODUODENOSCOPY (EGD) WITH PROPOFOL N/A 06/11/2021   Procedure: ESOPHAGOGASTRODUODENOSCOPY (EGD) WITH PROPOFOL;  Surgeon: Lavena Bullion, DO;  Location: [facility];  Service: Gastroenterology;  Laterality: N/A;   FINE NEEDLE ASPIRATION  03/11/2020   Procedure: FINE NEEDLE ASPIRATION (FNA) LINEAR;  Surgeon: Garner Nash, DO;  Location: [facility];  Service: Pulmonary;;   IR IMAGING GUIDED PORT INSERTION  10/16/2020   KNEE ARTHROSCOPY WITH ANTERIOR CRUCIATE LIGAMENT (  ACL) REPAIR     x 2   VIDEO BRONCHOSCOPY WITH ENDOBRONCHIAL ULTRASOUND N/A 03/11/2020   Procedure: VIDEO BRONCHOSCOPY WITH ENDOBRONCHIAL ULTRASOUND;  Surgeon: Garner Nash, DO;  Location: [facility];  Service: Pulmonary;  Laterality: N/A;     Social History:   reports that he quit smoking about 15 months ago. His smoking use included cigarettes. He has a 86.00 pack-year smoking history. He has never used smokeless tobacco. He reports that he does not currently use alcohol. He reports that he does not currently use drugs.   Family History:  His family history includes High blood pressure in his mother; Stroke in his mother. There is no history of Colon cancer, Esophageal cancer, Stomach cancer, or Pancreatic cancer.   Allergies Allergies  Allergen Reactions   Sitagliptin Other (See Comments)    headache      Home Medications  Prior to Admission medications   Medication Sig Start Date End Date Taking? Authorizing Provider  acetaminophen (TYLENOL) 500 MG tablet Take 1,000 mg by mouth every 6 (six) hours as needed for mild pain.   Yes [provider]  albuterol (PROVENTIL) (2.5 MG/3ML) 0.083% nebulizer solution Take 3 mLs (2.5 mg total) by nebulization every 4 (four) hours as needed for wheezing or shortness of breath (EVERY 4 HOURS AND PRN). 06/17/21  Yes Icard, Bradley L, DO  albuterol (VENTOLIN HFA) 108 (90 Base) MCG/ACT inhaler Inhale 1-2 puffs into the lungs every 4 (four) hours as needed for wheezing or shortness of breath. 05/25/21  Yes Icard, Octavio Graves, DO  aspirin EC 81 MG tablet Take 81 mg by mouth at bedtime.    Yes [provider]  atorvastatin (LIPITOR) 40 MG tablet TAKE 1 TABLET DAILY Patient taking differently: Take 40 mg by mouth at bedtime. 02/25/21  Yes Alexander, Lanelle Bal, DO  benzonatate (TESSALON) 200 MG capsule TAKE 1 CAPSULE (200 MG TOTAL) BY MOUTH 3 (THREE) TIMES DAILY AS NEEDED FOR COUGH. 06/12/21  Yes [name], DO  Budeson-Glycopyrrol-Formoterol (BREZTRI AEROSPHERE) 160-9-4.8 MCG/ACT AERO Inhale 2 puffs into the lungs in the morning and at bedtime. 05/25/21  Yes Icard, Octavio Graves, DO  dronabinol (MARINOL) 5 MG capsule Take 1 capsule by mouth 2  times daily before lunch and supper. 07/09/21  Yes Heilingoetter, Cassandra L, PA-C  Ferrous Fumarate-Vitamin C ER 65-25 MG TBCR Take 65 mg by mouth daily.   Yes [provider]  glipiZIDE (GLUCOTROL) 5 MG tablet TAKE 1 TABLET TWICE A DAY BEFORE MEALS Patient taking differently: Take 5 mg by mouth 2 (two) times daily before a meal. 07/28/21  Yes [name], DO  levothyroxine (SYNTHROID) 75 MCG tablet TAKE 1 TABLET BY MOUTH EVERY DAY BEFORE BREAKFAST Patient taking differently: Take 75 mcg by mouth daily before breakfast. 06/26/21  Yes Curt Bears, MD  lidocaine-prilocaine (EMLA) cream Apply 1  application topically as needed. Patient taking differently: Apply 1 application topically as needed (port access). 10/23/20  Yes Heilingoetter, Cassandra L, PA-C  loratadine (CLARITIN) 10 MG tablet Take 10 mg by mouth daily as needed for allergies (after chemo shot Fri, sat. sun.).   Yes [provider]  meloxicam (MOBIC) 15 MG tablet TAKE 1 TABLET DAILY Patient taking differently: Take 15 mg by mouth daily. 07/14/21  Yes [name], DO  metFORMIN (GLUCOPHAGE) 1000 MG tablet TAKE 1 TABLET TWICE A DAY WITH MEALS Patient taking differently: Take 1,000 mg by mouth 2 (two) times daily with a meal. 06/09/21  Yes [name], DO  ondansetron [facility])  8 MG tablet Take 1 tablet (8 mg total) by mouth every 8 (eight) hours as needed for nausea or vomiting. 07/15/21  Yes Heilingoetter, Cassandra L, PA-C  oxyCODONE-acetaminophen (PERCOCET/ROXICET) 5-325 MG tablet Take 1 tablet by mouth every 8 (eight) hours as needed for severe pain. 06/23/21  Yes Curt Bears, MD  pantoprazole (PROTONIX) 40 MG tablet Take 1 tablet (40 mg total) by mouth daily. 06/23/21 06/23/22 Yes Cirigliano, Vito V, DO  pilocarpine (SALAGEN) 5 MG tablet Take 1 tablet (5 mg total) by mouth 3 (three) times daily. 07/09/21  Yes Heilingoetter, Cassandra L, PA-C  Semaglutide,0.25 or 0.5MG /DOS, (OZEMPIC, 0.25 OR 0.5 MG/DOSE,) 2 MG/1.5ML SOPN Inject 0.5 mg into the skin once a week. 01/08/21  Yes Alexander, Lanelle Bal, DO  Lancets [facility] DELICA PLUS [id number]) MISC USE AS DIRECTED UP TO FOUR TIMES A DAY 02/06/21   [name], DO  [facility] ULTRA test strip USE AS INSTRUCTED UP TO FOUR TIMES A DAY 10/14/20   [name], DO  temazepam (RESTORIL) 15 MG capsule Take 1 capsule (15 mg total) by mouth at bedtime as needed for sleep. Patient not taking: No sig reported 01/01/21   Curt Bears, MD  Tiotropium Bromide-Olodaterol (STIOLTO RESPIMAT) 2.5-2.5 MCG/ACT AERS Inhale 2 puffs into the lungs daily. Patient not taking:  Reported on 07/30/2021 04/08/21   Parrett, Fonnie Mu, NP

## 2021-07-30 NOTE — H&P (Signed)
History and Physical    William Farrell BHA:[id number] DOB: 1960-12-29 DOA: 07/30/2021  PCP: [name], DO  [facility]  Chief Complaint: fatigue  HPI: William Farrell is a 60 y.o. male with medical history significant of non-small cell lung cancer on chemo, HLD, DM2. Presenting with fatigue and poor PO intake. For the last 2 weeks, he has noticed that he has become increasingly fatigue. He has no energy to do anything. His oral intake has been poor in part d/t painful swallowing. He says he feels as if he throat is sore. He went to see his onco doc today to get a chemo infusion, but he was found to be too weak. It was recommended that he come to the ED for evaluation.  ED Course: CXR showed loculated pleural effusion. His WBC were 44.5. Scr was 2.07. His glucose was 373, anion gap 17, and bicarb 17. His K+ was 5.5.   He was noted to be in a fib RVR. He was started on a dilt gtt. TRH was called for admission.   Review of Systems:  Denies CP, vomiting, diarrhea, fevers. Reports weakness, increasing dyspnea, nausea, wt loss.  Review of systems is otherwise negative for all not mentioned in HPI.   PMHx Past Medical History:  Diagnosis Date   Asthma    as a child   Diabetes ([place])    Diverticulitis    GERD (gastroesophageal reflux disease)    High cholesterol    History of kidney stones    Lung cancer ([place])    Wears glasses     PSHx Past Surgical History:  Procedure Laterality Date   BIOPSY  03/25/2021   Procedure: BIOPSY;  Surgeon: Lavena Bullion, DO;  Location: [facility];  Service: Gastroenterology;;   BIOPSY  06/11/2021   Procedure: BIOPSY;  Surgeon: Lavena Bullion, DO;  Location: [facility];  Service: Gastroenterology;;   broken bone repair     BRONCHIAL BIOPSY  03/11/2020   Procedure: BRONCHIAL BIOPSIES;  Surgeon: Garner Nash, DO;  Location: [facility];  Service: Pulmonary;;   COLONOSCOPY     around 49[place]     CRYOTHERAPY  03/11/2020   Procedure: CRYOTHERAPY;  Surgeon: Garner Nash, DO;  Location: [facility];  Service: Pulmonary;;   ESOPHAGEAL DILATION  03/25/2021   Procedure: ESOPHAGEAL DILATION;  Surgeon: Lavena Bullion, DO;  Location: [facility];  Service: Gastroenterology;;   ESOPHAGOGASTRODUODENOSCOPY (EGD) WITH PROPOFOL N/A 03/25/2021   Procedure: ESOPHAGOGASTRODUODENOSCOPY (EGD) WITH PROPOFOL;  Surgeon: Lavena Bullion, DO;  Location: [facility];  Service: Gastroenterology;  Laterality: N/A;  dialation of stricture   ESOPHAGOGASTRODUODENOSCOPY (EGD) WITH PROPOFOL N/A 06/11/2021   Procedure: ESOPHAGOGASTRODUODENOSCOPY (EGD) WITH PROPOFOL;  Surgeon: Lavena Bullion, DO;  Location: [facility];  Service: Gastroenterology;  Laterality: N/A;   FINE NEEDLE ASPIRATION  03/11/2020   Procedure: FINE NEEDLE ASPIRATION (FNA) LINEAR;  Surgeon: Garner Nash, DO;  Location: [facility];  Service: Pulmonary;;   IR IMAGING GUIDED PORT INSERTION  10/16/2020   KNEE ARTHROSCOPY WITH ANTERIOR CRUCIATE LIGAMENT (ACL) REPAIR     x 2   VIDEO BRONCHOSCOPY WITH ENDOBRONCHIAL ULTRASOUND N/A 03/11/2020   Procedure: VIDEO BRONCHOSCOPY WITH ENDOBRONCHIAL ULTRASOUND;  Surgeon: Garner Nash, DO;  Location: [facility];  Service: Pulmonary;  Laterality: N/A;    SocHx  reports that he quit smoking about 15 months ago. His smoking use included cigarettes. He has a 86.00 pack-year smoking history. He has never used smokeless tobacco.  He reports that he does not currently use alcohol. He reports that he does not currently use drugs.  Allergies  Allergen Reactions   Sitagliptin Other (See Comments)    headache    FamHx Family History  Problem Relation Age of Onset   High blood pressure Mother    Stroke Mother    Colon cancer Neg Hx    Esophageal cancer Neg Hx    Stomach cancer Neg Hx    Pancreatic cancer Neg Hx     Prior to Admission medications   Medication Sig Start Date End Date Taking?  Authorizing Provider  acetaminophen (TYLENOL) 500 MG tablet Take 1,000 mg by mouth every 6 (six) hours as needed for mild pain.   Yes [provider]  albuterol (PROVENTIL) (2.5 MG/3ML) 0.083% nebulizer solution Take 3 mLs (2.5 mg total) by nebulization every 4 (four) hours as needed for wheezing or shortness of breath (EVERY 4 HOURS AND PRN). 06/17/21  Yes Icard, Bradley L, DO  albuterol (VENTOLIN HFA) 108 (90 Base) MCG/ACT inhaler Inhale 1-2 puffs into the lungs every 4 (four) hours as needed for wheezing or shortness of breath. 05/25/21  Yes Icard, Octavio Graves, DO  aspirin EC 81 MG tablet Take 81 mg by mouth at bedtime.    Yes [provider]  atorvastatin (LIPITOR) 40 MG tablet TAKE 1 TABLET DAILY Patient taking differently: Take 40 mg by mouth at bedtime. 02/25/21  Yes Alexander, Lanelle Bal, DO  benzonatate (TESSALON) 200 MG capsule TAKE 1 CAPSULE (200 MG TOTAL) BY MOUTH 3 (THREE) TIMES DAILY AS NEEDED FOR COUGH. 06/12/21  Yes [name], DO  Budeson-Glycopyrrol-Formoterol (BREZTRI AEROSPHERE) 160-9-4.8 MCG/ACT AERO Inhale 2 puffs into the lungs in the morning and at bedtime. 05/25/21  Yes Icard, Octavio Graves, DO  dronabinol (MARINOL) 5 MG capsule Take 1 capsule by mouth 2  times daily before lunch and supper. 07/09/21  Yes Heilingoetter, Cassandra L, PA-C  Ferrous Fumarate-Vitamin C ER 65-25 MG TBCR Take 65 mg by mouth daily.   Yes [provider]  glipiZIDE (GLUCOTROL) 5 MG tablet TAKE 1 TABLET TWICE A DAY BEFORE MEALS Patient taking differently: Take 5 mg by mouth 2 (two) times daily before a meal. 07/28/21  Yes [name], DO  levothyroxine (SYNTHROID) 75 MCG tablet TAKE 1 TABLET BY MOUTH EVERY DAY BEFORE BREAKFAST Patient taking differently: Take 75 mcg by mouth daily before breakfast. 06/26/21  Yes Curt Bears, MD  lidocaine-prilocaine (EMLA) cream Apply 1 application topically as needed. Patient taking differently: Apply 1 application topically as needed  (port access). 10/23/20  Yes Heilingoetter, Cassandra L, PA-C  loratadine (CLARITIN) 10 MG tablet Take 10 mg by mouth daily as needed for allergies (after chemo shot Fri, sat. sun.).   Yes [provider]  meloxicam (MOBIC) 15 MG tablet TAKE 1 TABLET DAILY Patient taking differently: Take 15 mg by mouth daily. 07/14/21  Yes [name], DO  metFORMIN (GLUCOPHAGE) 1000 MG tablet TAKE 1 TABLET TWICE A DAY WITH MEALS Patient taking differently: Take 1,000 mg by mouth 2 (two) times daily with a meal. 06/09/21  Yes [name], DO  ondansetron (ZOFRAN) 8 MG tablet Take 1 tablet (8 mg total) by mouth every 8 (eight) hours as needed for nausea or vomiting. 07/15/21  Yes Heilingoetter, Cassandra L, PA-C  oxyCODONE-acetaminophen (PERCOCET/ROXICET) 5-325 MG tablet Take 1 tablet by mouth every 8 (eight) hours as needed for severe pain. 06/23/21  Yes Curt Bears, MD  pantoprazole (PROTONIX) 40 MG tablet Take 1 tablet (  40 mg total) by mouth daily. 06/23/21 06/23/22 Yes Cirigliano, Vito V, DO  pilocarpine (SALAGEN) 5 MG tablet Take 1 tablet (5 mg total) by mouth 3 (three) times daily. 07/09/21  Yes Heilingoetter, Cassandra L, PA-C  Semaglutide,0.25 or 0.5MG /DOS, (OZEMPIC, 0.25 OR 0.5 MG/DOSE,) 2 MG/1.5ML SOPN Inject 0.5 mg into the skin once a week. 01/08/21  Yes Alexander, Lanelle Bal, DO  Lancets [facility] DELICA PLUS [id number]) MISC USE AS DIRECTED UP TO FOUR TIMES A DAY 02/06/21   [name], DO  [facility] ULTRA test strip USE AS INSTRUCTED UP TO FOUR TIMES A DAY 10/14/20   [name], DO  temazepam (RESTORIL) 15 MG capsule Take 1 capsule (15 mg total) by mouth at bedtime as needed for sleep. Patient not taking: No sig reported 01/01/21   Curt Bears, MD  Tiotropium Bromide-Olodaterol (STIOLTO RESPIMAT) 2.5-2.5 MCG/ACT AERS Inhale 2 puffs into the lungs daily. Patient not taking: Reported on 07/30/2021 04/08/21   Melvenia Needles, NP    Physical Exam: Vitals:   07/30/21 1435  07/30/21 1440 07/30/21 1445 07/30/21 1448  BP: 113/75 112/77 109/76   Pulse: 94 92 92 91  Resp: (!) 21 (!) 21 13 16   Temp:      TempSrc:      SpO2: 96% 96% 95% 98%  Weight:      Height:        General: 60 y.o. male ill appearing male resting in bed Eyes: PERRL, normal sclera ENMT: Nares patent w/o discharge, orophaynx clear, dentition normal, ears w/o discharge/lesions/ulcers Neck: Supple, trachea midline Cardiovascular: tachy, +S1, S2, no m/g/r, equal pulses throughout Respiratory: decreased at right base, no w/r/r, increased WOB on 2L [place] GI: BS+, NDNT, no masses noted, no organomegaly noted MSK: No e/c/c Skin: pale appearing Neuro: A&O x 3, no focal deficits Psyc: Appropriate interaction but flat affect, calm/cooperative  Labs on Admission: I have personally reviewed following labs and imaging studies  CBC: Recent Labs  Lab 07/30/21 0927  WBC 44.5*  NEUTROABS 41.6*  HGB 9.2*  HCT 28.3*  MCV 87.6  PLT 160*   Basic Metabolic Panel: Recent Labs  Lab 07/30/21 0927  NA 135  K 5.5*  CL 101  CO2 17*  GLUCOSE 373*  BUN 72*  CREATININE 2.07*  CALCIUM 10.8*   GFR: Estimated Creatinine Clearance: 41.7 mL/min (A) (by C-G formula based on SCr of 2.07 mg/dL (H)). Liver Function Tests: Recent Labs  Lab 07/30/21 0927  AST 20  ALT 16  ALKPHOS 216*  BILITOT 0.4  PROT 8.0  ALBUMIN 1.9*   No results for input(s): LIPASE, AMYLASE in the last 168 hours. No results for input(s): AMMONIA in the last 168 hours. Coagulation Profile: No results for input(s): INR, PROTIME in the last 168 hours. Cardiac Enzymes: No results for input(s): CKTOTAL, CKMB, CKMBINDEX, TROPONINI in the last 168 hours. BNP (last 3 results) No results for input(s): PROBNP in the last 8760 hours. HbA1C: No results for input(s): HGBA1C in the last 72 hours. CBG: No results for input(s): GLUCAP in the last 168 hours. Lipid Profile: No results for input(s): CHOL, HDL, LDLCALC, TRIG, CHOLHDL,  LDLDIRECT in the last 72 hours. Thyroid Function Tests: Recent Labs    07/30/21 0927  TSH 1.862   Anemia Panel: No results for input(s): VITAMINB12, FOLATE, FERRITIN, TIBC, IRON, RETICCTPCT in the last 72 hours. Urine analysis:    Component Value Date/Time   PROTEINUR <30 06/17/2021 0940    Radiological Exams on Admission: [facility]  Result Date: 07/30/2021 CLINICAL DATA:  Shortness of breath EXAM: PORTABLE CHEST 1 VIEW COMPARISON:  Chest radiograph 04/08/2021, CT chest 06/08/2021 FINDINGS: There is a left chest wall port in place with the tip terminating in the region of the superior cavoatrial junction. The cardiomediastinal silhouette is grossly stable. Compared to the prior radiograph from 04/08/2021, there is significantly worsened opacity in the right upper lobe. Right apical pleural capping has also worsened. There is increased asymmetric elevation of the right hemidiaphragm. The left lung is clear. There is no acute osseous abnormality. IMPRESSION: 1. Compared to the prior radiograph from 04/08/2021, aeration of the right upper lobe has significantly worsened consistent with interval growth of the right upper lobe mass. Apical pleural capping has also increased, raising suspicion for a growing loculated pleural effusion. 2. Increased asymmetric elevation of the right hemidiaphragm raises suspicion for bronchial obstruction with associated volume loss. Electronically Signed   By: Valetta Mole M.D.   On: 07/30/2021 12:13    EKG: Independently reviewed. sinus  Assessment/Plan Sepsis secondary to purulent loculated pleural effusion Dyspnea     - admit to inpt, stepdown     - PCCM to perform thoracentesis and then send for CT chest     - will add cefepime, flagyl, and vanc; check MRSA swab     - fluids     - follow lactic acid, Bld cx     - wean O2 as able  Poor PO intake odynophagia     - right now, he looks like DKA; started on Endotool     - he have non-caloric  fluids for now     - report sore throat with swallowing; not thrush noted on exam     - trial of chloraseptic  A fib RVR     - started on dilt gtt in ED     - no history of a fib     - likely transient secondary to DKA, pulm insult; let's wean the dilt gtt as able; hold on anticoag for now; he's rate controlled now in sinus     - hold on cards consult for now  DKA DM2     - fluids, insulin gtt, q4h BMP     - can have non-calorics, otherwise NPO     - Endotool ordered  AKI on CKD3a     - fluids, follow  Non-small cell lung cancer of right upper lobe     - follows w/ Mohammed; on chemo; onco to follow while inpt  Fatigue     - let's get these acute issues addressed; then we can have PT/OT look at him  HLD     - resume home statin when he can tolerate PO  Hypothyroidism     - resume home synthroid when he is able to tolerate PO  Goals of care     - needs a PC consult  DVT prophylaxis: lovenox  Code Status: FULL  Family Communication: w/ wife at bedside  Consults called: Onco, PCCM   Status is: Inpatient  Remains inpatient appropriate because:Inpatient level of care appropriate due to severity of illness  Dispo: The patient is from: Home              Anticipated d/c is to: Home              Patient currently is not medically stable to d/c.   Difficult to place patient No   Time spent coordinating admission: 90 minutes  Abree Romick  A Cas Tracz DO [facility]  If 7PM-7AM, please contact night-coverage [email or web address]  07/30/2021, 2:59 PM

## 2021-07-30 NOTE — ED Notes (Signed)
Xray at the bedside.

## 2021-07-30 NOTE — ED Notes (Signed)
Pt in normal sinus. Per Pulm's verbal request, pt's Diltiazem drip decreased to 5mg /hr.

## 2021-07-30 NOTE — Progress Notes (Signed)
[place] [phone]   [phone]  OFFICE PROGRESS NOTE  [name], DO [address] [place] [address]  DIAGNOSIS: Recurrent/progressive non-small cell lung cancer initially diagnosed as stage IIIA (T2a, N2, M0) non-small cell lung cancer, squamous cell carcinoma diagnosed in April 2021, presented with right upper lobe/suprahilar lung mass in addition to right paratracheal and subcarinal lymphadenopathy.  Molecular Studies by [employer] [employer]: No actionable mutations   PRIOR THERAPY:  1) Concurrent chemoradiation with weekly carboplatin for AUC of 2 and paclitaxel 45 NG/M2.  First dose on Mar 31, 2020.  Status post 7 cycles.  Last dose was given on 05/12/2020 with partial response. 2) Consolidation immunotherapy with durvalumab 1500 mg IV every 4 weeks.  First dose May 12, 2020. Status post 3 cycles.  3) Systemic chemotherapy with carboplatin for an AUC of 5, paclitaxel 175 mg/m2, and Keytruda 200 mg IV every 3 weeks. First dose expected on 10/02/20.   Status post 3 cycles.  Last dose was given on November 13, 2020. 4) SBRT to the enlarging right upper lobe lung nodules under the care of Dr. Lisbeth Renshaw.  CURRENT THERAPY:  Second line systemic chemotherapy with docetaxel 75 Mg/M2 and Cyramza 10 mg/KG every 3 weeks with Neulasta support.  First dose December 11, 2020.  Status post 11  cycles.  Starting from cycle #10 I will reduce his dose of docetaxel to 65 Mg/M2 because of fatigue and intolerance.   INTERVAL HISTORY: William Farrell 60 y.o. male returns to the clinic today for follow-up visit accompanied by his wife.  The patient does not feel well today and he has significant decline in his condition over the last week.  He has not been able to eat much for the last 1-2 weeks.  He lost another 15 pounds since his last visit.  He complains of pain under the right breast worse with cough.  He also has shortness of breath at baseline  increased with exertion and currently on home oxygen.  The patient denied having any hemoptysis.  He has no nausea, vomiting, diarrhea or constipation but he has dry mouth and soreness.  He was started on pilocarpine with no improvement in his saliva.  The patient has no current headache or visual changes.  His wife is unable to handle the situation at home at this point.  He was supposed to start cycle #12 of his treatment today but the patient is too weak to be on any chemotherapy at this point.  Repeat CBC today showed significantly elevated total white blood count which could be secondary to the steroid premedication in addition to the previous Neulasta injection.  MEDICAL HISTORY: Past Medical History:  Diagnosis Date   Asthma    as a child   Diabetes ([facility])    Diverticulitis    GERD (gastroesophageal reflux disease)    High cholesterol    History of kidney stones    Lung cancer ([facility])    Wears glasses     ALLERGIES:  is allergic to sitagliptin.  MEDICATIONS:  Current Outpatient Medications  Medication Sig Dispense Refill   acetaminophen (TYLENOL) 500 MG tablet Take 1,000 mg by mouth every 6 (six) hours as needed for mild pain.     albuterol (PROVENTIL) (2.5 MG/3ML) 0.083% nebulizer solution Take 3 mLs (2.5 mg total) by nebulization every 4 (four) hours as needed for wheezing or shortness of breath (EVERY 4 HOURS AND PRN). 120 mL 5  albuterol (VENTOLIN HFA) 108 (90 Base) MCG/ACT inhaler Inhale 1-2 puffs into the lungs every 4 (four) hours as needed for wheezing or shortness of breath. 18 g 3   aspirin EC 81 MG tablet Take 81 mg by mouth at bedtime.      atorvastatin (LIPITOR) 40 MG tablet TAKE 1 TABLET DAILY (Patient taking differently: Take 40 mg by mouth at bedtime.) 90 tablet 3   benzonatate (TESSALON) 200 MG capsule TAKE 1 CAPSULE (200 MG TOTAL) BY MOUTH 3 (THREE) TIMES DAILY AS NEEDED FOR COUGH. 90 capsule 1   Budeson-Glycopyrrol-Formoterol (BREZTRI AEROSPHERE) 160-9-4.8 MCG/ACT  AERO Inhale 2 puffs into the lungs in the morning and at bedtime. 10.7 g 0   dronabinol (MARINOL) 5 MG capsule Take 1 capsule by mouth 2  times daily before lunch and supper. 60 capsule 2   Ferrous Fumarate-Vitamin C ER 65-25 MG TBCR Take 65 mg by mouth daily.     glipiZIDE (GLUCOTROL) 5 MG tablet TAKE 1 TABLET TWICE A DAY BEFORE MEALS 180 tablet 0   Lancets (ONETOUCH DELICA PLUS [id number]) MISC USE AS DIRECTED UP TO FOUR TIMES A DAY 100 each 13   levothyroxine (SYNTHROID) 75 MCG tablet TAKE 1 TABLET BY MOUTH EVERY DAY BEFORE BREAKFAST 30 tablet 1   lidocaine-prilocaine (EMLA) cream Apply 1 application topically as needed. (Patient taking differently: Apply 1 application topically as needed (port access).) 30 g 2   loratadine (CLARITIN) 10 MG tablet Take 10 mg by mouth daily as needed for allergies (after chemo shot Fri, sat. sun.).     meloxicam (MOBIC) 15 MG tablet TAKE 1 TABLET DAILY 90 tablet 3   metFORMIN (GLUCOPHAGE) 1000 MG tablet TAKE 1 TABLET TWICE A DAY WITH MEALS 180 tablet 1   ondansetron (ZOFRAN) 8 MG tablet Take 1 tablet (8 mg total) by mouth every 8 (eight) hours as needed for nausea or vomiting. 120 tablet 3   ONETOUCH ULTRA test strip USE AS INSTRUCTED UP TO FOUR TIMES A DAY 100 strip 13   oxyCODONE-acetaminophen (PERCOCET/ROXICET) 5-325 MG tablet Take 1 tablet by mouth every 8 (eight) hours as needed for severe pain. 30 tablet 0   pantoprazole (PROTONIX) 40 MG tablet Take 1 tablet (40 mg total) by mouth daily. 90 tablet 3   pilocarpine (SALAGEN) 5 MG tablet Take 1 tablet (5 mg total) by mouth 3 (three) times daily. 30 tablet 2   Semaglutide,0.25 or 0.5MG /DOS, (OZEMPIC, 0.25 OR 0.5 MG/DOSE,) 2 MG/1.5ML SOPN Inject 0.5 mg into the skin once a week. 7.5 mL 1   temazepam (RESTORIL) 15 MG capsule Take 1 capsule (15 mg total) by mouth at bedtime as needed for sleep. (Patient not taking: Reported on 06/17/2021) 30 capsule 0   Tiotropium Bromide-Olodaterol (STIOLTO RESPIMAT) 2.5-2.5 MCG/ACT  AERS Inhale 2 puffs into the lungs daily. 3 each 3   No current facility-administered medications for this visit.    SURGICAL HISTORY:  Past Surgical History:  Procedure Laterality Date   BIOPSY  03/25/2021   Procedure: BIOPSY;  Surgeon: Lavena Bullion, DO;  Location: [facility];  Service: Gastroenterology;;   BIOPSY  06/11/2021   Procedure: BIOPSY;  Surgeon: Lavena Bullion, DO;  Location: [facility];  Service: Gastroenterology;;   broken bone repair     BRONCHIAL BIOPSY  03/11/2020   Procedure: BRONCHIAL BIOPSIES;  Surgeon: Garner Nash, DO;  Location: [facility];  Service: Pulmonary;;   COLONOSCOPY     around [address]. [place]    CRYOTHERAPY  03/11/2020  Procedure: CRYOTHERAPY;  Surgeon: Garner Nash, DO;  Location: [facility];  Service: Pulmonary;;   ESOPHAGEAL DILATION  03/25/2021   Procedure: ESOPHAGEAL DILATION;  Surgeon: Lavena Bullion, DO;  Location: [facility];  Service: Gastroenterology;;   ESOPHAGOGASTRODUODENOSCOPY (EGD) WITH PROPOFOL N/A 03/25/2021   Procedure: ESOPHAGOGASTRODUODENOSCOPY (EGD) WITH PROPOFOL;  Surgeon: Lavena Bullion, DO;  Location: [facility];  Service: Gastroenterology;  Laterality: N/A;  dialation of stricture   ESOPHAGOGASTRODUODENOSCOPY (EGD) WITH PROPOFOL N/A 06/11/2021   Procedure: ESOPHAGOGASTRODUODENOSCOPY (EGD) WITH PROPOFOL;  Surgeon: Lavena Bullion, DO;  Location: [facility];  Service: Gastroenterology;  Laterality: N/A;   FINE NEEDLE ASPIRATION  03/11/2020   Procedure: FINE NEEDLE ASPIRATION (FNA) LINEAR;  Surgeon: Garner Nash, DO;  Location: [facility];  Service: Pulmonary;;   IR IMAGING GUIDED PORT INSERTION  10/16/2020   KNEE ARTHROSCOPY WITH ANTERIOR CRUCIATE LIGAMENT (ACL) REPAIR     x 2   VIDEO BRONCHOSCOPY WITH ENDOBRONCHIAL ULTRASOUND N/A 03/11/2020   Procedure: VIDEO BRONCHOSCOPY WITH ENDOBRONCHIAL ULTRASOUND;  Surgeon: Garner Nash, DO;  Location: [facility];  Service: Pulmonary;   Laterality: N/A;    REVIEW OF SYSTEMS:  Constitutional: positive for anorexia, fatigue, and weight loss Eyes: negative Ears, nose, mouth, throat, and face: positive for sore mouth and sore throat Respiratory: positive for cough, dyspnea on exertion, and pleurisy/chest pain Cardiovascular: negative Gastrointestinal: positive for dysphagia Genitourinary:negative Integument/breast: negative Hematologic/lymphatic: negative Musculoskeletal:positive for muscle weakness Neurological: negative Behavioral/Psych: negative Endocrine: negative Allergic/Immunologic: negative   PHYSICAL EXAMINATION: General appearance: alert, cooperative, fatigued, and no distress Head: Normocephalic, without obvious abnormality, atraumatic Neck: no adenopathy, no JVD, supple, symmetrical, trachea midline, and thyroid not enlarged, symmetric, no tenderness/mass/nodules Lymph nodes: Cervical, supraclavicular, and axillary nodes normal. Resp: rales bilaterally Back: symmetric, no curvature. ROM normal. No CVA tenderness. Cardio: regular rate and rhythm, S1, S2 normal, no murmur, click, rub or gallop GI: soft, non-tender; bowel sounds normal; no masses,  no organomegaly Extremities: extremities normal, atraumatic, no cyanosis or edema Neurologic: Alert and oriented X 3, normal strength and tone. Normal symmetric reflexes. Normal coordination and gait  ECOG PERFORMANCE STATUS: 2 - Symptomatic, <50% confined to bed  There were no vitals taken for this visit.  LABORATORY DATA: Lab Results  Component Value Date   WBC 22.0 (H) 07/09/2021   HGB 9.2 (L) 07/09/2021   HCT 28.2 (L) 07/09/2021   MCV 91.0 07/09/2021   PLT 268 07/09/2021      Chemistry      Component Value Date/Time   NA 136 07/09/2021 0927   K 4.6 07/09/2021 0927   CL 104 07/09/2021 0927   CO2 22 07/09/2021 0927   BUN 17 07/09/2021 0927   CREATININE 1.46 (H) 07/09/2021 0927   CREATININE 1.10 01/14/2020 0859      Component Value Date/Time    CALCIUM 9.6 07/09/2021 0927   ALKPHOS 117 07/09/2021 0927   AST 10 (L) 07/09/2021 0927   ALT 10 07/09/2021 0927   BILITOT 0.4 07/09/2021 0927       RADIOGRAPHIC STUDIES: No results found.  ASSESSMENT AND PLAN: This is a 60 years old white male was recently diagnosed with stage IIIa non-small cell lung cancer, squamous cell carcinoma in April 2021 presented with right upper lobe/suprahilar lung mass in addition to right paratracheal and subcarinal lymphadenopathy. The patient completed a course of concurrent chemoradiation with weekly carboplatin for AUC of 2 and paclitaxel 45 MG/M2.  He is status post 7 cycles.  He tolerated the previous course of  his treatment well except for mild dysphagia and odynophagia. He underwent consolidation treatment with immunotherapy with Imfinzi 1500 mg IV every 4 weeks.  Status post 3 cycles.  The patient tolerated the treatment well but unfortunately he has evidence for disease progression after cycle #3. He is started first-line treatment with chemotherapy with carboplatin for AUC of 5, paclitaxel 175 mg/M2 and Keytruda 200 mg IV every 3 weeks.  Status post 3 cycles.  He has been tolerating this treatment well with no concerning adverse effects except for mild fatigue. The patient had repeat CT scan of the chest, abdomen pelvis performed recently.  I personally and independently reviewed the scan images and discussed the result and showed the images to the patient today. Unfortunately his scan showed enlarging nodules and masses in the chest with enlarging lymph nodes.  This is definitely highly suspicious for disease progression but to the progression on immunotherapy could not be completely excluded. He is currently undergoing palliative systemic chemotherapy with second line docetaxel 75 Mg/M2 and Cyramza 10 mg/KG every 3 weeks with Neulasta support.  Status post 11 cycles.   He also underwent palliative radiotherapy to the enlarging right upper lobe lung mass  under the care of Dr. Lisbeth Renshaw. His last CT scan showed a stable disease except for small tiny bilateral pulmonary nodule that could be suspicious for metastasis.  He also has enlargement of a right supraclavicular nodal metastasis increased from 1.6 to 2.0 cm. The patient has a rough time tolerating this treatment even after reducing the dose of docetaxel to 65 Mg/M2.  He has increasing fatigue and weakness as well as lack of appetite and weight loss and worsening dyspnea. I recommended for the patient to go immediately to the emergency department for further evaluation and probably admission for the dehydration and to rule out any underlying infection or progression of his disease.  He will need repeat imaging studies including CT scan of the chest, abdomen pelvis. If the patient has significant disease progression, we will consider consultation with the palliative care team. For the lack of appetite and weight loss he is currently on Marinol. For pain management is currently on Percocet on as-needed basis. I will follow up the patient during his hospitalization for any additional recommendation. I will arrange for him a follow-up appointment after discharge for discussion of his future treatment options or palliative care. The patient and his wife agreed to the current plan. The patient voices understanding of current disease status and treatment options and is in agreement with the current care plan. All questions were answered. The patient knows to call the clinic with any problems, questions or concerns. We can certainly see the patient much sooner if necessary.  Disclaimer: This note was dictated with voice recognition software. Similar sounding words can inadvertently be transcribed and may not be corrected upon review.

## 2021-07-30 NOTE — ED Notes (Signed)
Pt placed on 2L nasal cannula for comfort. Per cancer center CMA, pt does not usually require supplemental O2.

## 2021-07-30 NOTE — Progress Notes (Signed)
Condition worsening- Pt transported to ED in wheelchair . He is in moderate distress with worsening dyspnea/pallor/weakness and R upper chest pain. He is very anxious. Pt is using his wife's oxygen . He does not have his own oxygen.

## 2021-07-30 NOTE — ED Provider Notes (Signed)
[facility] DEPT Provider Note   CSN: [id number] Arrival date & time: 07/30/21  1018     History Chief Complaint  Patient presents with   Dizziness    William Farrell is a 60 y.o. male.  60 year old male with history of lung cancer presents with cancer center due to increased weakness.  States that he has had decreased oral intake for the past several days.  Denies any hemoptysis.  No fever appreciated.  Has not weight loss.  Is receiving chemotherapy at this time.  He denies any history of palpitations he denies any history of palpitations or history of atrial fibrillation.  According to the note from his oncologist, he is receiving palliative treatment at this time.  He denies any blood per rectum but states that stools have been slightly dark.  States that he has had to use oxygen due to his increased dyspnea.      Past Medical History:  Diagnosis Date   Asthma    as a child   Diabetes ([place])    Diverticulitis    GERD (gastroesophageal reflux disease)    High cholesterol    History of kidney stones    Lung cancer ([place])    Wears glasses     Patient Active Problem List   Diagnosis Date Noted   Radiation-induced esophageal stricture    Cough 04/08/2021   Dysphagia    Gastroesophageal reflux disease with esophagitis without hemorrhage    Peptic stricture of esophagus    Hiatal hernia    Gastritis and gastroduodenitis    Insomnia 01/01/2021   Port-A-Cath in place 12/04/2020   Encounter for antineoplastic immunotherapy 06/04/2020   Malignant neoplasm of right upper lobe of lung ([place]) 03/13/2020   Encounter for antineoplastic chemotherapy 03/13/2020   Goals of care, counseling/discussion 03/13/2020   Lung mass 03/11/2020   Gastroesophageal reflux disease without esophagitis 02/15/2018   Type 2 diabetes mellitus without complication, without long-term current use of insulin ([place]) 07/07/2017   Hyperlipidemia 07/07/2017    Past Surgical  History:  Procedure Laterality Date   BIOPSY  03/25/2021   Procedure: BIOPSY;  Surgeon: Lavena Bullion, DO;  Location: [facility];  Service: Gastroenterology;;   BIOPSY  06/11/2021   Procedure: BIOPSY;  Surgeon: Lavena Bullion, DO;  Location: [facility];  Service: Gastroenterology;;   broken bone repair     BRONCHIAL BIOPSY  03/11/2020   Procedure: BRONCHIAL BIOPSIES;  Surgeon: Garner Nash, DO;  Location: [facility];  Service: Pulmonary;;   COLONOSCOPY     around [place]    CRYOTHERAPY  03/11/2020   Procedure: CRYOTHERAPY;  Surgeon: Garner Nash, DO;  Location: [facility];  Service: Pulmonary;;   ESOPHAGEAL DILATION  03/25/2021   Procedure: ESOPHAGEAL DILATION;  Surgeon: Lavena Bullion, DO;  Location: [facility];  Service: Gastroenterology;;   ESOPHAGOGASTRODUODENOSCOPY (EGD) WITH PROPOFOL N/A 03/25/2021   Procedure: ESOPHAGOGASTRODUODENOSCOPY (EGD) WITH PROPOFOL;  Surgeon: Lavena Bullion, DO;  [facility];  Service: Gastroenterology;  Laterality: N/A;  dialation of stricture   ESOPHAGOGASTRODUODENOSCOPY (EGD) WITH PROPOFOL N/A 06/11/2021   Procedure: ESOPHAGOGASTRODUODENOSCOPY (EGD) WITH PROPOFOL;  Surgeon: Lavena Bullion, DO;  [facility];  Service: Gastroenterology;  Laterality: N/A;   FINE NEEDLE ASPIRATION  03/11/2020   Procedure: FINE NEEDLE ASPIRATION (FNA) LINEAR;  Surgeon: Garner Nash, DO;  Location: [facility];  Service: Pulmonary;;   IR IMAGING GUIDED PORT INSERTION  10/16/2020   KNEE ARTHROSCOPY WITH ANTERIOR CRUCIATE LIGAMENT (ACL)  REPAIR     x 2   VIDEO BRONCHOSCOPY WITH ENDOBRONCHIAL ULTRASOUND N/A 03/11/2020   Procedure: VIDEO BRONCHOSCOPY WITH ENDOBRONCHIAL ULTRASOUND;  Surgeon: Garner Nash, DO;  Location: [facility];  Service: Pulmonary;  Laterality: N/A;       Family History  Problem Relation Age of Onset   High blood pressure Mother    Stroke Mother    Colon cancer Neg Hx    Esophageal  cancer Neg Hx    Stomach cancer Neg Hx    Pancreatic cancer Neg Hx     Social History   Tobacco Use   Smoking status: Former    Packs/day: 2.00    Years: 43.00    Pack years: 86.00    Types: Cigarettes    Quit date: 04/15/2020    Years since quitting: 1.2   Smokeless tobacco: Never  Vaping Use   Vaping Use: Never used  Substance Use Topics   Alcohol use: Not Currently   Drug use: Not Currently    Home Medications Prior to Admission medications   Medication Sig Start Date End Date Taking? Authorizing Provider  acetaminophen (TYLENOL) 500 MG tablet Take 1,000 mg by mouth every 6 (six) hours as needed for mild pain.    [provider]  albuterol (PROVENTIL) (2.5 MG/3ML) 0.083% nebulizer solution Take 3 mLs (2.5 mg total) by nebulization every 4 (four) hours as needed for wheezing or shortness of breath (EVERY 4 HOURS AND PRN). 06/17/21   Icard, Octavio Graves, DO  albuterol (VENTOLIN HFA) 108 (90 Base) MCG/ACT inhaler Inhale 1-2 puffs into the lungs every 4 (four) hours as needed for wheezing or shortness of breath. 05/25/21   Garner Nash, DO  aspirin EC 81 MG tablet Take 81 mg by mouth at bedtime.     [provider]  atorvastatin (LIPITOR) 40 MG tablet TAKE 1 TABLET DAILY Patient taking differently: Take 40 mg by mouth at bedtime. 02/25/21   [name], DO  benzonatate (TESSALON) 200 MG capsule TAKE 1 CAPSULE (200 MG TOTAL) BY MOUTH 3 (THREE) TIMES DAILY AS NEEDED FOR COUGH. 06/12/21   [name], DO  Budeson-Glycopyrrol-Formoterol (BREZTRI AEROSPHERE) 160-9-4.8 MCG/ACT AERO Inhale 2 puffs into the lungs in the morning and at bedtime. 05/25/21   Icard, Octavio Graves, DO  dronabinol (MARINOL) 5 MG capsule Take 1 capsule by mouth 2  times daily before lunch and supper. 07/09/21   Heilingoetter, Cassandra L, PA-C  Ferrous Fumarate-Vitamin C ER 65-25 MG TBCR Take 65 mg by mouth daily.    [provider]  glipiZIDE (GLUCOTROL) 5 MG tablet TAKE 1 TABLET  TWICE A DAY BEFORE MEALS 07/28/21   [name], DO  Lancets [facility] DELICA PLUS [id number]) [place] USE AS DIRECTED UP TO FOUR TIMES A DAY 02/06/21   [name], DO  levothyroxine (SYNTHROID) 75 MCG tablet TAKE 1 TABLET BY MOUTH EVERY DAY BEFORE BREAKFAST 06/26/21   Curt Bears, MD  lidocaine-prilocaine (EMLA) cream Apply 1 application topically as needed. Patient taking differently: Apply 1 application topically as needed (port access). 10/23/20   Heilingoetter, Cassandra L, PA-C  loratadine (CLARITIN) 10 MG tablet Take 10 mg by mouth daily as needed for allergies (after chemo shot Fri, sat. sun.).    [provider]  meloxicam (MOBIC) 15 MG tablet TAKE 1 TABLET DAILY 07/14/21   [name], DO  metFORMIN (GLUCOPHAGE) 1000 MG tablet TAKE 1 TABLET TWICE A DAY WITH MEALS 06/09/21   [name], DO  ondansetron (ZOFRAN) 8 MG  tablet Take 1 tablet (8 mg total) by mouth every 8 (eight) hours as needed for nausea or vomiting. 07/15/21   Heilingoetter, Cassandra L, PA-C  ONETOUCH ULTRA test strip USE AS INSTRUCTED UP TO FOUR TIMES A DAY 10/14/20   [name], DO  oxyCODONE-acetaminophen (PERCOCET/ROXICET) 5-325 MG tablet Take 1 tablet by mouth every 8 (eight) hours as needed for severe pain. 06/23/21   Curt Bears, MD  pantoprazole (PROTONIX) 40 MG tablet Take 1 tablet (40 mg total) by mouth daily. 06/23/21 06/23/22  Cirigliano, Vito V, DO  pilocarpine (SALAGEN) 5 MG tablet Take 1 tablet (5 mg total) by mouth 3 (three) times daily. 07/09/21   Heilingoetter, Cassandra L, PA-C  Semaglutide,0.25 or 0.5MG /DOS, (OZEMPIC, 0.25 OR 0.5 MG/DOSE,) 2 MG/1.5ML SOPN Inject 0.5 mg into the skin once a week. 01/08/21   [name], DO  temazepam (RESTORIL) 15 MG capsule Take 1 capsule (15 mg total) by mouth at bedtime as needed for sleep. Patient not taking: Reported on 06/17/2021 01/01/21   Curt Bears, MD  Tiotropium Bromide-Olodaterol (STIOLTO RESPIMAT) 2.5-2.5 MCG/ACT  AERS Inhale 2 puffs into the lungs daily. 04/08/21   Parrett, Fonnie Mu, NP    Allergies    Sitagliptin  Review of Systems   Review of Systems  All other systems reviewed and are negative.  Physical Exam Updated Vital Signs BP 114/68 (BP Location: Right Arm)   Pulse (!) 137   Resp (!) 30   SpO2 95%   Physical Exam Vitals and nursing note reviewed.  Constitutional:      General: He is not in acute distress.    Appearance: Normal appearance. He is well-developed. He is not toxic-appearing.  HENT:     Head: Normocephalic and atraumatic.  Eyes:     General: Lids are normal.     Conjunctiva/sclera: Conjunctivae normal.     Pupils: Pupils are equal, round, and reactive to light.  Neck:     Thyroid: No thyroid mass.     Trachea: No tracheal deviation.  Cardiovascular:     Rate and Rhythm: Tachycardia present. Rhythm irregular.     Heart sounds: Normal heart sounds. No murmur heard.   No gallop.  Pulmonary:     Effort: Pulmonary effort is normal. No respiratory distress.     Breath sounds: Normal breath sounds. No stridor. No decreased breath sounds, wheezing, rhonchi or rales.  Abdominal:     General: There is no distension.     Palpations: Abdomen is soft.     Tenderness: There is no abdominal tenderness. There is no rebound.  Musculoskeletal:        General: No tenderness. Normal range of motion.     Cervical back: Normal range of motion and neck supple.  Skin:    General: Skin is warm and dry.     Coloration: Skin is pale.     Findings: No abrasion or rash.  Neurological:     Mental Status: He is alert and oriented to person, place, and time. Mental status is at baseline.     GCS: GCS eye subscore is 4. GCS verbal subscore is 5. GCS motor subscore is 6.     Cranial Nerves: Cranial nerves are intact. No cranial nerve deficit.     Sensory: No sensory deficit.     Motor: Motor function is intact.  Psychiatric:        Attention and Perception: Attention normal.         Mood and Affect: Mood is anxious.  Speech: Speech normal.        Behavior: Behavior normal.    ED Results / Procedures / Treatments   Labs (all labs ordered are listed, but only abnormal results are displayed) Labs Reviewed  RESP PANEL BY RT-PCR (FLU A&B, COVID) [id number]  CULTURE, BLOOD (ROUTINE X 2)  CULTURE, BLOOD (ROUTINE X 2)  LACTIC ACID, PLASMA  POC OCCULT BLOOD, ED  TYPE AND SCREEN    EKG None ED ECG REPORT   Date: 07/30/2021  Rate: 135  Rhythm: atrial fibrillation  QRS Axis: normal  Intervals: normal  ST/T Wave abnormalities: nonspecific ST changes  Conduction Disutrbances:none  Narrative Interpretation:   Old EKG Reviewed: none available  I have personally reviewed the EKG tracing and agree with the computerized printout as noted.  Radiology No results found.  Procedures Procedures   Medications Ordered in ED Medications  lactated ringers infusion (has no administration in time range)  lactated ringers bolus 1,000 mL (has no administration in time range)  diltiazem (CARDIZEM) 125 mg in dextrose 5% 125 mL (1 mg/mL) infusion (has no administration in time range)    ED Course  I have reviewed the triage vital signs and the nursing notes.  Pertinent labs & imaging results that were available during my care of the patient were reviewed by me and considered in my medical decision making (see chart for details).    MDM Rules/Calculators/A&P                           Patient given IV fluids here as his labs show evidence of dehydration.  Was also found to be in A. fib with RVR.  Patient treated with Cardizem bolus as well as Cardizem drip and heart rate did improve.  Chest x-ray shows new right-sided effusion.  He has guaiac negative from below.  Will admit to the hospitalist service   CRITICAL CARE Performed by: Leota Jacobsen Total critical care time: 50 minutes Critical care time was exclusive of separately billable procedures and treating other  patients. Critical care was necessary to treat or prevent imminent or life-threatening deterioration. Critical care was time spent personally by me on the following activities: development of treatment plan with patient and/or surrogate as well as nursing, discussions with consultants, evaluation of patient's response to treatment, examination of patient, obtaining history from patient or surrogate, ordering and performing treatments and interventions, ordering and review of laboratory studies, ordering and review of radiographic studies, pulse oximetry and re-evaluation of patient's condition.  Final Clinical Impression(s) / ED Diagnoses Final diagnoses:  None    Rx / DC Orders ED Discharge Orders     None        Lacretia Leigh, MD 07/30/21 1416

## 2021-07-30 NOTE — ED Notes (Signed)
ED provider at the bedside.  

## 2021-07-30 NOTE — ED Notes (Signed)
Diltiazem rate increased from 5mg /hr to 10mg /hr per written order parameters.

## 2021-07-30 NOTE — Progress Notes (Signed)
Unable to obtain ABG- CCM Meier ordered VBG.

## 2021-07-30 NOTE — ED Notes (Signed)
Provider declines blood cultures and lactic at this time.

## 2021-07-30 NOTE — Progress Notes (Signed)
[facility] Progress Note Patient Name: William Farrell DOB: [birth year] MRN: [id number]   Date of Service  07/30/2021  HPI/Events of Note  Pt with NSCLCa presents with a RIGHT empyema; chest tube now placed; cults pending.  On an insulin gtt due to DKA.  Was in afib/rvr.  eICU Interventions  After chest tube placement, pt's HR and RR are more stable.  Remains in icu on an insulin infusion      Intervention Category Evaluation Type: New Patient Evaluation  Tilden Dome 07/30/2021, 10:29 PM

## 2021-07-30 NOTE — ED Notes (Signed)
Per cancer center, "not doing well"-SOB, mouth sores, weak

## 2021-07-30 NOTE — Progress Notes (Signed)
Pharmacy Antibiotic Note  William Farrell is a 60 y.o. male admitted on 07/30/2021 with empyema vs cholesterol/TG effusion, also concern for aspiration .  Pharmacy has been consulted for Vancomycin & Cefepime dosing, Anaerobic coverage needed per CCM, TRH added Flagyl 500mg  IV q12  SCr elevated at 2.07, previous values avg 1.5  Plan: Cefepime 2gm q12 Vanc 1750 x1, then 1250mg  q24 Flagyl 500mg  IV q12  Height: 6' (182.9 cm) Weight: 88.9 kg (196 lb) IBW/kg (Calculated) : 77.6  Temp (24hrs), Avg:98.7 F (37.1 C), Min:98.7 F (37.1 C), Max:98.7 F (37.1 C)  Recent Labs  Lab 07/30/21 0927  WBC 44.5*  CREATININE 2.07*    Estimated Creatinine Clearance: 41.7 mL/min (A) (by C-G formula based on SCr of 2.07 mg/dL (H)).    Allergies  Allergen Reactions   Sitagliptin Other (See Comments)    headache   Antimicrobials this admission: 9/15 Cefepime >>  9/15 Flagyl >>  9/15 Vancomycin >>  Dose adjustments this admission:  Microbiology results: BCx: not released UCx: not released  9/15 Pleural fluid Cx: sent 9/15 Pleural TG level: in process 9/15 MRSA PCR: ordered  Thank you for allowing pharmacy to be a part of this patient's care.  Minda Ditto PharmD 07/30/2021 5:25 PM

## 2021-07-30 NOTE — ED Notes (Signed)
Hospitalist at the bedside 

## 2021-07-30 NOTE — ED Triage Notes (Signed)
Pt presents to the ED from the Cancer center for dizziness, weakness, poor PO intake, weight loss and SOB which has been increasing over the past few weeks per the pt. Pt is being treated for lung cancer with his last treatment around 3 weeks ago which consisted of Docetaxel and Cymraza. Pt also receives [place] support. Hx provided by Carolynn Sayers., CMA.

## 2021-07-30 NOTE — ED Notes (Signed)
Pulm at the bedside to drain pt's right pleural effusion.

## 2021-07-30 NOTE — ED Notes (Signed)
ED provider notified of pt VS and presentation.

## 2021-07-30 NOTE — ED Notes (Signed)
Pulm at the bedside.

## 2021-07-30 NOTE — Procedures (Signed)
Insertion of Chest Tube Procedure Note  William Farrell  [id number]  [birth year]  Date:07/30/21  Time:5:48 PM    Provider Performing: [name]; Dr. Verlee Monte at bedside.   Procedure: Pleural Catheter Insertion w/ Imaging Guidance ([id number])     Indication(s) Effusion  Consent Risks of the procedure as well as the alternatives and risks of each were explained to the patient and/or caregiver.  Consent for the procedure was obtained and is signed in the bedside chart  Anesthesia Topical only with 1% lidocaine    Time Out Verified patient identification, verified procedure, site/side was marked, verified correct patient position, special equipment/implants available, medications/allergies/relevant history reviewed, required imaging and test results available.   Sterile Technique Maximal sterile technique including full sterile barrier drape, hand hygiene, sterile gown, sterile gloves, mask, hair covering, sterile ultrasound probe cover (if used).   Procedure Description Ultrasound used to identify appropriate pleural anatomy for placement and overlying skin marked. Area of placement cleaned and draped in sterile fashion.  A 14 French pigtail pleural catheter was placed into the right pleural space using Seldinger technique. Appropriate return of pus was obtained.  The tube was connected to atrium and placed on -20 cm H2O wall suction.   Complications/Tolerance None; patient tolerated the procedure well. Chest X-ray is ordered to verify placement.   EBL Minimal  Specimen(s) fluid, pleural right sent for culture, gram stain, cell differential, glucose, cytology, triglycerides       [name], ACNP  Pulmonary & Critical Care 07/30/2021, 5:50 PM  See Amion for pager If no response to pager, please call PCCM consult pager After 7:00 pm call Elink

## 2021-07-31 ENCOUNTER — Inpatient Hospital Stay (HOSPITAL_COMMUNITY): Payer: BC Managed Care – PPO

## 2021-07-31 ENCOUNTER — Inpatient Hospital Stay: Payer: BC Managed Care – PPO

## 2021-07-31 DIAGNOSIS — J90 Pleural effusion, not elsewhere classified: Secondary | ICD-10-CM | POA: Diagnosis not present

## 2021-07-31 DIAGNOSIS — C3491 Malignant neoplasm of unspecified part of right bronchus or lung: Secondary | ICD-10-CM | POA: Diagnosis not present

## 2021-07-31 DIAGNOSIS — I428 Other cardiomyopathies: Secondary | ICD-10-CM

## 2021-07-31 DIAGNOSIS — C3490 Malignant neoplasm of unspecified part of unspecified bronchus or lung: Secondary | ICD-10-CM

## 2021-07-31 DIAGNOSIS — I4891 Unspecified atrial fibrillation: Secondary | ICD-10-CM | POA: Diagnosis not present

## 2021-07-31 DIAGNOSIS — E440 Moderate protein-calorie malnutrition: Secondary | ICD-10-CM | POA: Diagnosis not present

## 2021-07-31 LAB — HIV ANTIBODY (ROUTINE TESTING W REFLEX): HIV Screen 4th Generation wRfx: NONREACTIVE

## 2021-07-31 LAB — BASIC METABOLIC PANEL
Anion gap: 11 (ref 5–15)
BUN: 62 mg/dL — ABNORMAL HIGH (ref 6–20)
CO2: 21 mmol/L — ABNORMAL LOW (ref 22–32)
Calcium: 9.4 mg/dL (ref 8.9–10.3)
Chloride: 106 mmol/L (ref 98–111)
Creatinine, Ser: 1.23 mg/dL (ref 0.61–1.24)
GFR, Estimated: >60 mL/min (ref >60)
Glucose, Bld: 173 mg/dL — ABNORMAL HIGH (ref 70–99)
Potassium: 4.3 mmol/L (ref 3.5–5.1)
Sodium: 138 mmol/L (ref 135–145)

## 2021-07-31 LAB — GLUCOSE, CAPILLARY
Glucose-Capillary: 137 mg/dL — ABNORMAL HIGH (ref 70–99)
Glucose-Capillary: 148 mg/dL — ABNORMAL HIGH (ref 70–99)
Glucose-Capillary: 158 mg/dL — ABNORMAL HIGH (ref 70–99)
Glucose-Capillary: 159 mg/dL — ABNORMAL HIGH (ref 70–99)
Glucose-Capillary: 164 mg/dL — ABNORMAL HIGH (ref 70–99)
Glucose-Capillary: 168 mg/dL — ABNORMAL HIGH (ref 70–99)
Glucose-Capillary: 171 mg/dL — ABNORMAL HIGH (ref 70–99)
Glucose-Capillary: 194 mg/dL — ABNORMAL HIGH (ref 70–99)
Glucose-Capillary: 217 mg/dL — ABNORMAL HIGH (ref 70–99)
Glucose-Capillary: 244 mg/dL — ABNORMAL HIGH (ref 70–99)
Glucose-Capillary: 248 mg/dL — ABNORMAL HIGH (ref 70–99)

## 2021-07-31 LAB — COMPREHENSIVE METABOLIC PANEL
ALT: 14 U/L (ref 0–44)
AST: 14 U/L — ABNORMAL LOW (ref 15–41)
Albumin: 2.1 g/dL — ABNORMAL LOW (ref 3.5–5.0)
Alkaline Phosphatase: 139 U/L — ABNORMAL HIGH (ref 38–126)
Anion gap: 8 (ref 5–15)
BUN: 65 mg/dL — ABNORMAL HIGH (ref 6–20)
CO2: 22 mmol/L (ref 22–32)
Calcium: 9.4 mg/dL (ref 8.9–10.3)
Chloride: 107 mmol/L (ref 98–111)
Creatinine, Ser: 1.35 mg/dL — ABNORMAL HIGH (ref 0.61–1.24)
GFR, Estimated: >60 mL/min (ref >60)
Glucose, Bld: 169 mg/dL — ABNORMAL HIGH (ref 70–99)
Potassium: 4.5 mmol/L (ref 3.5–5.1)
Sodium: 137 mmol/L (ref 135–145)
Total Bilirubin: 0.4 mg/dL (ref 0.3–1.2)
Total Protein: 7 g/dL (ref 6.5–8.1)

## 2021-07-31 LAB — CBC
HCT: 23.4 % — ABNORMAL LOW (ref 39.0–52.0)
Hemoglobin: 7.5 g/dL — ABNORMAL LOW (ref 13.0–17.0)
MCH: 28.4 pg (ref 26.0–34.0)
MCHC: 32.1 g/dL (ref 30.0–36.0)
MCV: 88.6 fL (ref 80.0–100.0)
Platelets: 380 10*3/uL (ref 150–400)
RBC: 2.64 MIL/uL — ABNORMAL LOW (ref 4.22–5.81)
RDW: 19.9 % — ABNORMAL HIGH (ref 11.5–15.5)
WBC: 30.7 10*3/uL — ABNORMAL HIGH (ref 4.0–10.5)
nRBC: 0.3 % — ABNORMAL HIGH (ref 0.0–0.2)

## 2021-07-31 LAB — HEMOGLOBIN A1C
Hgb A1c MFr Bld: 6.6 % — ABNORMAL HIGH (ref 4.8–5.6)
Mean Plasma Glucose: 142.72 mg/dL

## 2021-07-31 LAB — ECHOCARDIOGRAM COMPLETE
AR max vel: 3.53 cm2
AV Area VTI: 3.44 cm2
AV Area mean vel: 3.27 cm2
AV Mean grad: 4 mmHg
AV Peak grad: 6.3 mmHg
Ao pk vel: 1.25 m/s
Area-P 1/2: 4.49 cm2
Height: 72 in
S' Lateral: 2.4 cm
Single Plane A4C EF: 63.8 %
Weight: 3136 oz

## 2021-07-31 LAB — CORTISOL-AM, BLOOD: Cortisol - AM: 6.5 ug/dL — ABNORMAL LOW (ref 6.7–22.6)

## 2021-07-31 LAB — IRON AND TIBC
Iron: 25 ug/dL — ABNORMAL LOW (ref 45–182)
Saturation Ratios: 14 % — ABNORMAL LOW (ref 17.9–39.5)
TIBC: 174 ug/dL — ABNORMAL LOW (ref 250–450)
UIBC: 149 ug/dL

## 2021-07-31 LAB — PROTIME-INR
INR: 1.7 — ABNORMAL HIGH (ref 0.8–1.2)
Prothrombin Time: 19.8 seconds — ABNORMAL HIGH (ref 11.4–15.2)

## 2021-07-31 LAB — PROCALCITONIN: Procalcitonin: 3.21 ng/mL

## 2021-07-31 LAB — HEMOGLOBIN AND HEMATOCRIT, BLOOD
HCT: 22.5 % — ABNORMAL LOW (ref 39.0–52.0)
Hemoglobin: 7 g/dL — ABNORMAL LOW (ref 13.0–17.0)

## 2021-07-31 LAB — MRSA NEXT GEN BY PCR, NASAL: MRSA by PCR Next Gen: NOT DETECTED

## 2021-07-31 LAB — BETA-HYDROXYBUTYRIC ACID: Beta-Hydroxybutyric Acid: 0.1 mmol/L (ref 0.05–0.27)

## 2021-07-31 LAB — FERRITIN: Ferritin: 1352 ng/mL — ABNORMAL HIGH (ref 24–336)

## 2021-07-31 LAB — TSH: TSH: 2.355 u[IU]/mL (ref 0.350–4.500)

## 2021-07-31 MED ORDER — ADULT MULTIVITAMIN W/MINERALS CH
1.0000 | ORAL_TABLET | Freq: Every day | ORAL | Status: DC
  Administered 2021-07-31 – 2021-08-18 (×19): 1 via ORAL
  Filled 2021-07-31 (×19): qty 1

## 2021-07-31 MED ORDER — LEVOTHYROXINE SODIUM 75 MCG PO TABS
75.0000 ug | ORAL_TABLET | Freq: Every day | ORAL | Status: DC
  Administered 2021-07-31 – 2021-08-18 (×19): 75 ug via ORAL
  Filled 2021-07-31 (×19): qty 1

## 2021-07-31 MED ORDER — OXYCODONE-ACETAMINOPHEN 5-325 MG PO TABS
1.0000 | ORAL_TABLET | Freq: Three times a day (TID) | ORAL | Status: DC | PRN
  Administered 2021-07-31: 1 via ORAL
  Filled 2021-07-31: qty 1

## 2021-07-31 MED ORDER — BOOST / RESOURCE BREEZE PO LIQD CUSTOM
1.0000 | Freq: Three times a day (TID) | ORAL | Status: DC
  Administered 2021-07-31 – 2021-08-18 (×27): 1 via ORAL

## 2021-07-31 MED ORDER — HYDRALAZINE HCL 20 MG/ML IJ SOLN: 10.0000 mg | INTRAMUSCULAR | Status: DC | PRN

## 2021-07-31 MED ORDER — DEXTROSE IN LACTATED RINGERS 5 % IV SOLN: INTRAVENOUS | Status: AC

## 2021-07-31 MED ORDER — TRAZODONE HCL 50 MG PO TABS
50.0000 mg | ORAL_TABLET | Freq: Every evening | ORAL | Status: DC | PRN
  Administered 2021-08-05 – 2021-08-08 (×2): 50 mg via ORAL
  Filled 2021-07-31 (×2): qty 1

## 2021-07-31 MED ORDER — METOPROLOL TARTRATE 5 MG/5ML IV SOLN
5.0000 mg | INTRAVENOUS | Status: DC | PRN
  Administered 2021-08-08 – 2021-08-15 (×3): 5 mg via INTRAVENOUS
  Filled 2021-07-31 (×3): qty 5

## 2021-07-31 MED ORDER — LORATADINE 10 MG PO TABS: 10.0000 mg | ORAL_TABLET | Freq: Every day | ORAL | Status: DC | PRN

## 2021-07-31 MED ORDER — LIDOCAINE 5 % EX PTCH
1.0000 | MEDICATED_PATCH | Freq: Every day | CUTANEOUS | Status: DC
  Administered 2021-07-31 – 2021-08-18 (×19): 1 via TRANSDERMAL
  Filled 2021-07-31 (×20): qty 1

## 2021-07-31 MED ORDER — INSULIN ASPART 100 UNIT/ML IJ SOLN
0.0000 [IU] | Freq: Every day | INTRAMUSCULAR | Status: DC
  Administered 2021-07-31 – 2021-08-14 (×2): 2 [IU] via SUBCUTANEOUS

## 2021-07-31 MED ORDER — SENNOSIDES-DOCUSATE SODIUM 8.6-50 MG PO TABS: 1.0000 | ORAL_TABLET | Freq: Every evening | ORAL | Status: DC | PRN

## 2021-07-31 MED ORDER — SENNOSIDES-DOCUSATE SODIUM 8.6-50 MG PO TABS
2.0000 | ORAL_TABLET | Freq: Every day | ORAL | Status: DC
  Administered 2021-07-31 – 2021-08-17 (×18): 2 via ORAL
  Filled 2021-07-31 (×18): qty 2

## 2021-07-31 MED ORDER — INSULIN GLARGINE-YFGN 100 UNIT/ML ~~LOC~~ SOLN
20.0000 [IU] | Freq: Every day | SUBCUTANEOUS | Status: DC
  Administered 2021-07-31 – 2021-08-07 (×8): 20 [IU] via SUBCUTANEOUS
  Filled 2021-07-31 (×9): qty 0.2

## 2021-07-31 MED ORDER — INSULIN ASPART 100 UNIT/ML IJ SOLN
0.0000 [IU] | Freq: Three times a day (TID) | INTRAMUSCULAR | Status: DC
  Administered 2021-07-31 (×2): 5 [IU] via SUBCUTANEOUS
  Administered 2021-08-01 (×2): 3 [IU] via SUBCUTANEOUS
  Administered 2021-08-01: 5 [IU] via SUBCUTANEOUS
  Administered 2021-08-02: 8 [IU] via SUBCUTANEOUS
  Administered 2021-08-02 (×2): 3 [IU] via SUBCUTANEOUS
  Administered 2021-08-03: 5 [IU] via SUBCUTANEOUS
  Administered 2021-08-03 – 2021-08-04 (×4): 2 [IU] via SUBCUTANEOUS
  Administered 2021-08-05: 3 [IU] via SUBCUTANEOUS
  Administered 2021-08-05 – 2021-08-08 (×7): 2 [IU] via SUBCUTANEOUS
  Administered 2021-08-10: 3 [IU] via SUBCUTANEOUS
  Administered 2021-08-11: 2 [IU] via SUBCUTANEOUS
  Administered 2021-08-11: 3 [IU] via SUBCUTANEOUS
  Administered 2021-08-11 – 2021-08-12 (×3): 2 [IU] via SUBCUTANEOUS
  Administered 2021-08-12 – 2021-08-13 (×3): 3 [IU] via SUBCUTANEOUS
  Administered 2021-08-14: 5 [IU] via SUBCUTANEOUS
  Administered 2021-08-14: 2 [IU] via SUBCUTANEOUS
  Administered 2021-08-15: 3 [IU] via SUBCUTANEOUS
  Administered 2021-08-15: 2 [IU] via SUBCUTANEOUS
  Administered 2021-08-15 – 2021-08-16 (×2): 3 [IU] via SUBCUTANEOUS
  Administered 2021-08-16: 2 [IU] via SUBCUTANEOUS
  Administered 2021-08-16: 3 [IU] via SUBCUTANEOUS
  Administered 2021-08-17 – 2021-08-18 (×4): 2 [IU] via SUBCUTANEOUS
  Administered 2021-08-18: 3 [IU] via SUBCUTANEOUS

## 2021-07-31 MED ORDER — HYDROMORPHONE HCL 1 MG/ML IJ SOLN
INTRAMUSCULAR | Status: AC
  Administered 2021-07-31: 1 mg via INTRAVENOUS
  Filled 2021-07-31: qty 1

## 2021-07-31 MED ORDER — IPRATROPIUM-ALBUTEROL 0.5-2.5 (3) MG/3ML IN SOLN: 3.0000 mL | RESPIRATORY_TRACT | Status: DC | PRN

## 2021-07-31 MED ORDER — ASPIRIN EC 81 MG PO TBEC
81.0000 mg | DELAYED_RELEASE_TABLET | Freq: Every day | ORAL | Status: DC
  Administered 2021-07-31 – 2021-08-15 (×16): 81 mg via ORAL
  Filled 2021-07-31 (×16): qty 1

## 2021-07-31 MED ORDER — VANCOMYCIN HCL 1500 MG/300ML IV SOLN
1500.0000 mg | INTRAVENOUS | Status: DC
  Administered 2021-07-31: 1500 mg via INTRAVENOUS
  Filled 2021-07-31 (×2): qty 300

## 2021-07-31 MED ORDER — PANTOPRAZOLE SODIUM 40 MG PO TBEC
40.0000 mg | DELAYED_RELEASE_TABLET | Freq: Every day | ORAL | Status: DC
  Administered 2021-07-31 – 2021-08-18 (×19): 40 mg via ORAL
  Filled 2021-07-31 (×19): qty 1

## 2021-07-31 MED ORDER — ATORVASTATIN CALCIUM 40 MG PO TABS
40.0000 mg | ORAL_TABLET | Freq: Every day | ORAL | Status: DC
  Administered 2021-07-31 – 2021-08-17 (×18): 40 mg via ORAL
  Filled 2021-07-31 (×18): qty 1

## 2021-07-31 MED ORDER — OXYCODONE-ACETAMINOPHEN 5-325 MG PO TABS
1.0000 | ORAL_TABLET | ORAL | Status: DC | PRN
  Administered 2021-07-31 – 2021-08-17 (×44): 1 via ORAL
  Filled 2021-07-31 (×45): qty 1

## 2021-07-31 MED ORDER — SODIUM CHLORIDE 0.9 % IV SOLN
2.0000 g | Freq: Three times a day (TID) | INTRAVENOUS | Status: DC
  Administered 2021-07-31 – 2021-08-02 (×7): 2 g via INTRAVENOUS
  Filled 2021-07-31 (×7): qty 2

## 2021-07-31 MED ORDER — HYDROMORPHONE HCL 1 MG/ML IJ SOLN
1.0000 mg | INTRAMUSCULAR | Status: DC | PRN
Start: 2021-07-31 — End: 2021-08-12
  Administered 2021-08-01 – 2021-08-12 (×39): 1 mg via INTRAVENOUS
  Filled 2021-07-31 (×39): qty 1

## 2021-07-31 MED ORDER — HYDROMORPHONE HCL 1 MG/ML IJ SOLN: 1.0000 mg | Freq: Once | INTRAMUSCULAR | Status: AC

## 2021-07-31 NOTE — Progress Notes (Signed)
Inpatient Diabetes Program Recommendations  AACE/ADA: New Consensus Statement on Inpatient Glycemic Control (2015)  Target Ranges:  Prepandial:   less than 140 mg/dL      Peak postprandial:   less than 180 mg/dL (1-2 hours)      Critically ill patients:  140 - 180 mg/dL  Results for William Farrell, William Farrell (MRN [id number]) as of 07/31/2021 07:11  Ref. Range 07/30/2021 18:04 07/30/2021 19:28 07/30/2021 21:20 07/30/2021 22:42 07/30/2021 23:39 07/31/2021 00:40 07/31/2021 02:21 07/31/2021 03:30 07/31/2021 04:45 07/31/2021 05:57 07/31/2021 06:56  Glucose-Capillary Latest Ref Range: 70 - 99 mg/dL 280 (H)  IV Insulin Drip Started 207 (H) 135 (H) 120 (H) 150 (H) 171 (H) 194 (H) 164 (H) 168 (H) 137 (H) 148 (H)   Presents to ED from Cancer center for dizziness, weakness, poor PO intake, weight loss and SOB which has been increasing over the past few weeks/ Being treated for lung cancer  History: DM2  Home DM Meds: Metformin 1000 mg BID   Glipizide 5 mg BID    Ozempic 0.5 mg Qweek   Current Orders: IV Insulin Drip     MD- Note 2:32am BMET shows the following: Glucose 169 mg/dl Anion Gap 8 CO2 level 22  When pt allowed to transition to SQ Insulin, please consider:  1. Start Semglee 10 units Daily (~0.1 units/kg)--Make sure to start Semglee at least 1 hour prior to d/c of the IV Insulin Drip  2. Start Novolog Sensitive Correction Scale/ SSI (0-9 units) Q4 hours (currently NPO)    --Will follow patient during hospitalization--  Wyn Quaker RN, MSN, CDE Diabetes Coordinator Inpatient Glycemic Control Team Team Pager: [phone] (8a-5p)

## 2021-07-31 NOTE — Plan of Care (Signed)
  Problem: Education: Goal: Knowledge of General Education information will improve Description Including pain rating scale, medication(s)/side effects and non-pharmacologic comfort measures Outcome: Progressing   

## 2021-07-31 NOTE — Progress Notes (Signed)
Initial Nutrition Assessment  DOCUMENTATION CODES:   Non-severe (moderate) malnutrition in context of chronic illness  INTERVENTION:   -Boost Breeze po TID, each supplement provides 250 kcal and 9 grams of protein  -pt requests with popsicle mixed in  -Magic cup TID with meals, each supplement provides 290 kcal and 9 grams of protein  -Multivitamin with minerals daily  NUTRITION DIAGNOSIS:   Moderate Malnutrition related to chronic illness, cancer and cancer related treatments as evidenced by percent weight loss, moderate fat depletion, moderate muscle depletion, energy intake < or equal to 75% for > or equal to 1 month.  GOAL:   Patient will meet greater than or equal to 90% of their needs  MONITOR:   PO intake, Supplement acceptance, Labs, Weight trends, I & O's  REASON FOR ASSESSMENT:   Malnutrition Screening Tool    ASSESSMENT:   60 year old male with prior history of Stage IIIa NSCLC ([facility]) RUL diagnosed 02/2020 (RUL/suprahilar mass and R paratracheal and subcarinal LAD) s/p chemo XRT including keytruda, consolidation IT with durvalumab, SBRT to RUL nodules followed by Dr. Julien Nordmann. He is receiving neulasta support.     Presented to Dr. Julien Nordmann for follow-up, reportedly not felt well for 1-2 weeks, 15 lb weight loss since his prior visit, pain under right breast, worse with cough and progressive dyspnea on exertion even on his home O2.  He has had productive cough, orthopnea that has worsened over the last week.  9/15: s/p chest tube placement  Patient in room with wife at bedside. Pt does not appear to feel well. Shaking his legs and feeling anxious. States he is frustrated. Has not eaten yet today. States his throat is sore, using chloraseptic spray.  Per pt's wife, pt has had no solid food for 4 weeks now. Pt mainly consuming jellos, puddings and water. Pt likes Ensure but mixed with chocolate syrup and ice cream, was not interested without syrup. Was willing to try  [facility], wants a popsicle added. Willing to try Magic cups as well-orange flavored.  Per weight records, pt has lost 38 lbs since 6/1 (16% wt loss x 3.5 months, significant for time frame).   Medications: D5 infusion, Lactated ringers  Labs reviewed:  CBGs: 158-217  NUTRITION - FOCUSED PHYSICAL EXAM:  Flowsheet Row Most Recent Value  Orbital Region Moderate depletion  Upper Arm Region Moderate depletion  Thoracic and Lumbar Region Unable to assess  Buccal Region Moderate depletion  Temple Region Moderate depletion  Clavicle Bone Region Mild depletion  Clavicle and Acromion Bone Region Moderate depletion  Scapular Bone Region Moderate depletion  Dorsal Hand Moderate depletion  Patellar Region Unable to assess  Anterior Thigh Region Unable to assess  Posterior Calf Region Unable to assess  Edema (RD Assessment) None       Diet Order:   Diet Order             Diet Carb Modified Fluid consistency: Thin; Room service appropriate? Yes  Diet effective now                   EDUCATION NEEDS:   Education needs have been addressed  Skin:  Skin Assessment: Reviewed RN Assessment  Last BM:  PTA  Height:   Ht Readings from Last 1 Encounters:  07/30/21 6' (1.829 m)    Weight:   Wt Readings from Last 1 Encounters:  07/30/21 88.9 kg    BMI:  Body mass index is 26.58 kg/m.  Estimated Nutritional Needs:  Kcal:  2400-2600  Protein:  125-135g  Fluid:  >2L/day  Clayton Bibles, MS, RD, LDN Inpatient Clinical Dietitian Contact information available via Amion

## 2021-07-31 NOTE — Progress Notes (Signed)
[facility] Progress Note Patient Name: William Farrell DOB: 1960/11/18 MRN: [id number]   Date of Service  07/31/2021  HPI/Events of Note  Hgb dropped to 7.5  eICU Interventions  No interventions yet needed; not bleeding clinically; repeat Hgb later in AM     Intervention Category Intermediate Interventions: OtherTilden Dome 07/31/2021, 5:32 AM

## 2021-07-31 NOTE — TOC Initial Note (Signed)
Transition of Care [facility]) - Initial/Assessment Note    Patient Details  Name: William Farrell MRN: [id number] Date of Birth: [birth year]  Transition of Care [facility]) CM/SW Contact:    Leeroy Cha, RN Phone Number: 07/31/2021, 7:43 AM  Clinical Narrative:                  60 y.o. male with medical history significant of non-small cell lung cancer on chemo, HLD, DM2. Presenting with fatigue and poor PO intake. For the last 2 weeks, he has noticed that he has become increasingly fatigue. He has no energy to do anything. His oral intake has been poor in part d/t painful swallowing. He says he feels as if he throat is sore. He went to see his onco doc today to get a chemo infusion, but he was found to be too weak. It was recommended that he come to the ED for evaluation.   ED Course: CXR showed loculated pleural effusion. His WBC were 44.5. Scr was 2.07. His glucose was 373, anion gap 17, and bicarb 17. His K+ was 5.5.   He was noted to be in a fib RVR. He was started on a dilt gtt. TRH was called for admission.  [id number] cardizem on hold, rt chest =tube present, Toc plan of care- to retrun to home. Progression: rt chest tube, iv abx x3, iv Cardizem on hold, iv d5lr at 125cc/hr, bun=65 creat 1.35, wbc=30.7 hgb 7.5 Expected Discharge Plan: [place] Barriers to Discharge: Continued Medical Work up   Patient Goals and CMS Choice Patient states their goals for this hospitalization and ongoing recovery are:: to go home CMS Medicare.gov Compare Post Acute Care list provided to:: Patient    Expected Discharge Plan and Services Expected Discharge Plan: [place]   Discharge Planning Services: CM Consult   Living arrangements for the past 2 months: Single Family Home                                      Prior Living Arrangements/Services Living arrangements for the past 2 months: Single Family Home Lives with:: Spouse Patient language and  need for interpreter reviewed:: Yes Do you feel safe going back to the place where you live?: Yes            Criminal Activity/Legal Involvement Pertinent to Current Situation/Hospitalization: No - Comment as needed  Activities of Daily Living Home Assistive Devices/Equipment: Eyeglasses, Shower chair with back, Cane (specify quad or straight), Nebulizer ADL Screening (condition at time of admission) Patient's cognitive ability adequate to safely complete daily activities?: Yes Is the patient deaf or have difficulty hearing?: No Does the patient have difficulty seeing, even when wearing glasses/contacts?: No Does the patient have difficulty concentrating, remembering, or making decisions?: Yes (has not ate much in 10 days) Patient able to express need for assistance with ADLs?: Yes Does the patient have difficulty dressing or bathing?: Yes Independently performs ADLs?: No Communication: Independent Dressing (OT): Needs assistance Is this a change from baseline?: Pre-admission baseline Grooming: Independent Feeding: Independent Bathing: Needs assistance Is this a change from baseline?: Pre-admission baseline Toileting: Needs assistance Is this a change from baseline?: Pre-admission baseline In/Out Bed: Needs assistance Is this a change from baseline?: Pre-admission baseline Walks in Home: Needs assistance Is this a change from baseline?: Pre-admission baseline Does the patient have difficulty walking or climbing stairs?: Yes  Weakness of Legs: Both Weakness of Arms/Hands: Both  Permission Sought/Granted                  Emotional Assessment Appearance:: Appears stated age     Orientation: : Oriented to Self, Oriented to Place, Oriented to  Time, Oriented to Situation Alcohol / Substance Use: Not Applicable Psych Involvement: No (comment)  Admission diagnosis:  Dizziness [R42] Pleural effusion [J90] Encounter for chest tube placement [Z46.82] Atrial fibrillation with  rapid ventricular response (HCC) [I48.91] Patient Active Problem List   Diagnosis Date Noted   Atrial fibrillation with rapid ventricular response ([place]) 07/30/2021   Pleural effusion, right    Radiation-induced esophageal stricture    Cough 04/08/2021   Dysphagia    Gastroesophageal reflux disease with esophagitis without hemorrhage    Peptic stricture of esophagus    Hiatal hernia    Gastritis and gastroduodenitis    Insomnia 01/01/2021   Port-A-Cath in place 12/04/2020   Encounter for antineoplastic immunotherapy 06/04/2020   Malignant neoplasm of right upper lobe of lung ([place]) 03/13/2020   Encounter for antineoplastic chemotherapy 03/13/2020   Goals of care, counseling/discussion 03/13/2020   Lung mass 03/11/2020   Gastroesophageal reflux disease without esophagitis 02/15/2018   Type 2 diabetes mellitus without complication, without long-term current use of insulin ([place]) 07/07/2017   Hyperlipidemia 07/07/2017   PCP:  [name], DO Pharmacy:   [facility],  - [address] [place] [address] Phone: [phone] Fax: [phone]  [facility] [place], [place] [address] [place] [address] Phone: [phone] Fax: [phone]  [place], [address] [place] [address] Phone: [phone] Fax: [place] [address] Phone: [phone] Fax: [phone]     Social Determinants of Health (SDOH) Interventions    Readmission Risk Interventions No flowsheet data found.

## 2021-07-31 NOTE — Evaluation (Signed)
Physical Therapy Evaluation Patient Details Name: William Farrell MRN: [id number] DOB: Jun 27, 1961 Today's Date: 07/31/2021  History of Present Illness  Pt is 60 year old with history of stage IIIa non-small cell lung cancer on chemo who presents to the hospital for fatigue and poor oral intake for the past 2 weeks on 07/30/21.  He was found to be in atrial fibrillation with RVR, DKA and had loculated pleural effusion. Chest tube was inserted 07/30/2021. Pt with other hx of HLD and DM2  Clinical Impression  Pt admitted with above diagnosis. At baseline, pt limited ambulator/fatigues easily but was working from home.  He did not require AD to ambulate but does have a stair lift.  Has support from his wife.  Today, pt expressed frustration at being told to rest, having painful chest tube, and then being expected to get up with therapy (saw PT/OT together to condense therapy today).  Educated on purpose of therapy and benefits of activity/risk of inactivity.  Discussed in future would schedule a time for therapy prior to arrival.  Pt was able to transfer and amb 3' with min guard with cues for safety and lines.  Limited due to fatigue and pain from chest tube.  Needs further assessment and discussion with pt of DME needs.  Pt currently with functional limitations due to the deficits listed below (see PT Problem List). Pt will benefit from skilled PT to increase their independence and safety with mobility to allow discharge to the venue listed below.          Recommendations for follow up therapy are one component of a multi-disciplinary discharge planning process, led by the attending physician.  Recommendations may be updated based on patient status, additional functional criteria and insurance authorization.  Follow Up Recommendations Home health PT;Supervision for mobility/OOB    Equipment Recommendations  Other (comment) (needs further assessment and planning with pt (pt not in mood to discuss  plans today) - possible rollator or RW)    Recommendations for Other Services       Precautions / Restrictions Precautions Precautions: Fall Restrictions Weight Bearing Restrictions: No      Mobility  Bed Mobility Overal bed mobility: Needs Assistance Bed Mobility: Supine to Sit;Sit to Supine     Supine to sit: Min guard Sit to supine: Min guard   General bed mobility comments: Min guard for safety and management of lines.    Transfers Overall transfer level: Needs assistance Equipment used: None Transfers: Sit to/from [facility] Sit to Stand: Min guard Stand pivot transfers: Min guard       General transfer comment: min guard for standing - steps at side of bed and turning.  Ambulation/Gait Ambulation/Gait assistance: Min guard Gait Distance (Feet): 3 Feet Assistive device: None Gait Pattern/deviations: Step-to pattern;Decreased stride length Gait velocity: decreased   General Gait Details: min guard for safety and lines; cues for line management and safety  Stairs            Wheelchair Mobility    Modified Rankin (Stroke Patients Only)       Balance Overall balance assessment: Needs assistance Sitting-balance support: No upper extremity supported Sitting balance-Leahy Scale: Good     Standing balance support: No upper extremity supported Standing balance-Leahy Scale: Fair                               Pertinent Vitals/Pain Pain Assessment: Faces Faces Pain Scale: Hurts even more Pain  Location: CT site Pain Descriptors / Indicators: Grimacing;Guarding;Discomfort Pain Intervention(s): Limited activity within patient's tolerance;Monitored during session;Patient requesting pain meds-RN notified    Home Living Family/patient expects to be discharged to:: Private residence Living Arrangements: Spouse/significant other Available Help at Discharge: Family Type of Home: House Home Access: Stairs to enter    [employer] of Steps: 2+1+1+3 (only the 3 have rails) Home Layout: Two level;Bed/bath upstairs Home Equipment: Tub bench;Cane - single point Additional Comments: stair lift    Prior Function Level of Independence: Needs assistance   Gait / Transfers Assistance Needed: Very limited walking baseline from room to room with cane  ADL's / Homemaking Assistance Needed: Does showers sitting on bench but takes increased time; ADLs with increased time  Comments: Working remotely from home 40 hrs a week prior to admission.     Hand Dominance   Dominant Hand: Right    Extremity/Trunk Assessment   Upper Extremity Assessment Upper Extremity Assessment: Defer to OT evaluation    Lower Extremity Assessment Lower Extremity Assessment: Overall WFL for tasks assessed (Pt with limited testing due to pain and pt frustrated.  Demonstrated at least 3/5 strength and able to stand, but do expect generalized weakness.)    Cervical / Trunk Assessment Cervical / Trunk Assessment: Normal  Communication   Communication: No difficulties  Cognition Arousal/Alertness: Awake/alert Behavior During Therapy: WFL for tasks assessed/performed Overall Cognitive Status: Within Functional Limits for tasks assessed                                 General Comments: Pt expressed frustration at being told he could rest and then therapy arriving.  Educated on role of therapy and importance of activity to maintain strength.  Pt participated but still was frustrated.  Discussed would schedule a time in the future and have premedicated if possible.      General Comments General comments (skin integrity, edema, etc.): VSS    Exercises     Assessment/Plan    PT Assessment Patient needs continued PT services  PT Problem List Decreased strength;Decreased mobility;Decreased safety awareness;Decreased activity tolerance;Cardiopulmonary status limiting activity;Decreased balance;Decreased  knowledge of use of DME       PT Treatment Interventions DME instruction;Therapeutic activities;Gait training;Therapeutic exercise;Stair training;Balance training;Functional mobility training;Patient/family education    PT Goals (Current goals can be found in the Care Plan section)  Acute Rehab PT Goals Patient Stated Goal: return home; walk more; get chest tube out PT Goal Formulation: With patient/family Time For Goal Achievement: 08/14/21 Potential to Achieve Goals: Good    Frequency Min 3X/week   Barriers to discharge        Co-evaluation               AM-PAC PT "6 Clicks" Mobility  Outcome Measure Help needed turning from your back to your side while in a flat bed without using bedrails?: A Little Help needed moving from lying on your back to sitting on the side of a flat bed without using bedrails?: A Little Help needed moving to and from a bed to a chair (including a wheelchair)?: A Little Help needed standing up from a chair using your arms (e.g., wheelchair or bedside chair)?: A Little Help needed to walk in hospital room?: A Little Help needed climbing 3-5 steps with a railing? : A Little 6 Click Score: 18    End of Session   Activity Tolerance:  (self limited) Patient left:  in bed;with call bell/phone within reach;with bed alarm set;with family/visitor present Nurse Communication: Mobility status PT Visit Diagnosis: Other abnormalities of gait and mobility (R26.89);Muscle weakness (generalized) (M62.81)    Time: 3094-0768 PT Time Calculation (min) (ACUTE ONLY): 28 min   Charges:   PT Evaluation $PT Eval Moderate Complexity: 1 Melina Schools, PT Acute Rehab Services Pager [phone] [facility] [phone]   Karlton Lemon 07/31/2021, 4:25 PM

## 2021-07-31 NOTE — Progress Notes (Signed)
DIAGNOSIS: Recurrent/progressive non-small cell lung cancer initially diagnosed as stage IIIA (T2a, N2, M0) non-small cell lung cancer, squamous cell carcinoma diagnosed in April 2021, presented with right upper lobe/suprahilar lung mass in addition to right paratracheal and subcarinal lymphadenopathy.   Molecular Studies by [employer]: No actionable mutations   PRIOR THERAPY:  1) Concurrent chemoradiation with weekly carboplatin for AUC of 2 and paclitaxel 45 NG/M2.  First dose on Mar 31, 2020.  Status post 7 cycles.  Last dose was given on 05/12/2020 with partial response. 2) Consolidation immunotherapy with durvalumab 1500 mg IV every 4 weeks.  First dose May 12, 2020. Status post 3 cycles.  3) Systemic chemotherapy with carboplatin for an AUC of 5, paclitaxel 175 mg/m2, and Keytruda 200 mg IV every 3 weeks. First dose expected on 10/02/20.   Status post 3 cycles.  Last dose was given on November 13, 2020. 4) SBRT to the enlarging right upper lobe lung nodules under the care of Dr. Lisbeth Renshaw.   CURRENT THERAPY:  Second line systemic chemotherapy with docetaxel 75 Mg/M2 and Cyramza 10 mg/KG every 3 weeks with Neulasta support.  First dose December 11, 2020.  Status post 11  cycles.  Starting from cycle #10 I will reduce his dose of docetaxel to 65 Mg/M2 because of fatigue and intolerance.  Subjective: The patient is seen and examined today.  His wife was at the bedside.  He continues to complain of increasing fatigue and weakness as well as the soreness in his mouth and lack of oral intake as well as the shortness of breath.  Chest x-ray performed yesterday showed right pleural effusion in addition to worsening of his disease in the right lung.  The patient had a chest tube placed yesterday.  He continues to have pain on the right side of the chest.  He denied having any current fever or chills.  Objective: Vital signs in last 24 hours: Temp:  [96.8 F (36 C)-98.7 F (37.1 C)] 97.6 F (36.4 C)  (09/16 0836) Pulse Rate:  [39-151] 87 (09/16 0400) Resp:  [12-39] 18 (09/16 0400) BP: (90-128)/(50-97) 111/70 (09/16 0400) SpO2:  [92 %-99 %] 95 % (09/16 0836) Weight:  [196 lb (88.9 kg)-210 lb 4.8 oz (95.4 kg)] 196 lb (88.9 kg) (09/15 1133)  Intake/Output from previous day: 09/15 0701 - 09/16 0700 In: 1377.1 [I.V.:1176.7; IV Piggyback:200.4] Out: 1000 [Chest Tube:1000] Intake/Output this shift: Total I/O In: -  Out: 100 [Chest Tube:100]  General appearance: alert, cooperative, fatigued, and no distress Resp: diminished breath sounds RLL and dullness to percussion RLL Cardio: regular rate and rhythm, S1, S2 normal, no murmur, click, rub or gallop GI: soft, non-tender; bowel sounds normal; no masses,  no organomegaly Extremities: extremities normal, atraumatic, no cyanosis or edema  Lab Results:  Recent Labs    07/30/21 2219 07/31/21 0232  WBC 35.1* 30.7*  HGB 8.3* 7.5*  HCT 25.9* 23.4*  PLT 217 380   BMET Recent Labs    07/31/21 0232 07/31/21 0800  NA 137 138  K 4.5 4.3  CL 107 106  CO2 22 21*  GLUCOSE 169* 173*  BUN 65* 62*  CREATININE 1.35* 1.23  CALCIUM 9.4 9.4    Studies/Results: DG Chest Port 1 View  Result Date: 07/31/2021 CLINICAL DATA:  Pleural effusion, chest tube placement. EXAM: PORTABLE CHEST 1 VIEW COMPARISON:  July 30, 2021. FINDINGS: LEFT-sided central venous access device terminates at the caval cavoatrial junction, port projecting over the LEFT chest. RIGHT sided chest tube is  in place. In similar position. Tube along the medial RIGHT chest entering via RIGHT lateral chest. RIGHT hemidiaphragm remains elevated. EKG leads project over the chest. Cardiomediastinal contours and hilar structures are stable. Known mass in the RIGHT upper lobe and associated volume loss unchanged from the recent radiograph from July 30, 2021. Small apical pneumothorax is similar to the prior study. Lungs are otherwise clear. Small effusion likely along the RIGHT  lung apex. On limited assessment there is no acute skeletal process. IMPRESSION: Stable chest x-ray. Known RIGHT upper lobe mass, volume loss and small RIGHT apical pneumothorax with RIGHT chest tube in place. Electronically Signed   By: Zetta Bills M.D.   On: 07/31/2021 08:06   DG Chest Port 1 View  Result Date: 07/30/2021 CLINICAL DATA:  Chest tube EXAM: PORTABLE CHEST 1 VIEW COMPARISON:  07/30/2021, CT 06/08/2021 FINDINGS: Left-sided central venous port tip over the cavoatrial region. Elevated right diaphragm. Interim placement of right lower chest drainage catheter with decreased right pleural effusion. At least small residual right pleural effusion. Right hilar and suprahilar opacity likely corresponds to CT demonstrated mass. Suspicion of right apical pneumothorax, possibly loculated. No midline shift. IMPRESSION: 1. Interim placement of right lower chest tube with decreased right pleural effusion. At least small residual right pleural collection, with suspicion of right apical small pneumothorax, possibly loculated 2. Right hilar and suprahilar opacity, corresponding to known lung mass. Electronically Signed   By: Donavan Foil M.D.   On: 07/30/2021 19:09   DG Chest Port 1 View  Result Date: 07/30/2021 CLINICAL DATA:  Shortness of breath EXAM: PORTABLE CHEST 1 VIEW COMPARISON:  Chest radiograph 04/08/2021, CT chest 06/08/2021 FINDINGS: There is a left chest wall port in place with the tip terminating in the region of the superior cavoatrial junction. The cardiomediastinal silhouette is grossly stable. Compared to the prior radiograph from 04/08/2021, there is significantly worsened opacity in the right upper lobe. Right apical pleural capping has also worsened. There is increased asymmetric elevation of the right hemidiaphragm. The left lung is clear. There is no acute osseous abnormality. IMPRESSION: 1. Compared to the prior radiograph from 04/08/2021, aeration of the right upper lobe has  significantly worsened consistent with interval growth of the right upper lobe mass. Apical pleural capping has also increased, raising suspicion for a growing loculated pleural effusion. 2. Increased asymmetric elevation of the right hemidiaphragm raises suspicion for bronchial obstruction with associated volume loss. Electronically Signed   By: Valetta Mole M.D.   On: 07/30/2021 12:13    Medications: I have reviewed the patient's current medications.  CODE STATUS: Full code.  Assessment/Plan: This is a very pleasant 60 years old white male with recurrent non-small cell lung cancer initially diagnosed as a stage IIIa (T2 a, N2, M0) non-small cell lung cancer, squamous cell carcinoma in April 2021 status post a course of concurrent chemoradiation with weekly carboplatin and paclitaxel followed by 3 cycles of consolidation immunotherapy with Imfinzi discontinued secondary to disease progression.  The patient started systemic chemotherapy with carboplatin, paclitaxel and Keytruda for 3 cycles but this was also discontinued secondary to disease progression.  He underwent SBRT to the enlarging right upper lobe lung nodule under the care of Dr. Lisbeth Renshaw.  The patient started second line systemic chemotherapy with docetaxel and Cyramza status post 11 cycles.  He has a rough time tolerating this treatment because of the significant adverse effects but he has initial response to the treatment except the last scans that showed slight increase in  right supraclavicular lymph node. The patient was admitted to the hospital with failure to thrive in addition to shortness of breath and weakness. I recommended for the patient to have repeat CT scan at least of the chest during his hospitalization for evaluation of his disease because of concern about disease progression.  I would love to have CT scan of the abdomen with contrast but the patient will not be able to drink the contrast and he declined the CT scan of the abdomen  and pelvis. For the dehydration and poor p.o. intake, will continue with the IV hydration. For the questionable pneumonia and sepsis, the patient will continue with the current antibiotic regimen with cefepime and vancomycin per the ICU team. If the imaging studies showed significant disease progression, I will discuss with the patient other treatment options including systemic chemotherapy versus palliative care. Thank you so much for taking good care of Mr. Pickrel, I will continue to follow-up the patient with you and assist in his management.  If you need any additional help from oncology during the weekend, please reach out to the on-call oncologist.   LOS: 1 day    Eilleen Kempf 07/31/2021

## 2021-07-31 NOTE — Progress Notes (Addendum)
PROGRESS NOTE    William Farrell  RWE:[id number] DOB: 20-Mar-1961 DOA: 07/30/2021 PCP: [name], DO   Brief Narrative:  60 year old with history of stage IIIa non-small cell lung cancer on chemo, HLD, DM2 presents to the hospital for fatigue and poor oral intake for the past 2 weeks.  He was found to be in atrial fibrillation with RVR, DKA and had loculated pleural effusion.  Pulmonary team was consulted, started on Cardizem drip and broad-spectrum antibiotics.  Chest tube was inserted 07/30/2021.   Assessment & Plan:   Active Problems:   Atrial fibrillation with rapid ventricular response (HCC)  Sepsis secondary to loculated pleural effusion, purulent Acute respiratory distress - Status post chest tube placement 9/15.  Leukocytosis improving.  Procalcitonin 3.2 - PCCM following - Antibiotics-vancomycin, cefepime and Flagyl -Bronchodilators as needed.  I-S/flutter valve  Atrial fibrillation with RVR, resolved - Repeat EKG this morning shows NSR.  Transition him off the drip. TSH - WNL. Order Echo. No need for [facility] right now, may have just been transient due to underlying pulm issues.   Diabetic ketoacidosis Diabetes mellitus type 2 - Patient on Endo tool overnight, anion gap closed.  We will transition him to SQ. home regimen metformin, glipizide and Ozempic.  Acute kidney injury on CKD stage IIIa -Admission creatinine 2.07.  Baseline 1.3.  Improved  Weakness with poor oral intake  Stage III A non-small cell lung cancer - Follows outpatient with oncology on chemotherapy  Anemia of chronic disease - Baseline hemoglobin 8.0.  No obvious evidence of bleeding.  Hemoglobin today 7.5  Hyperlipidemia - Statin  Hypothyroidism -Synthroid  GERD - PPI    DVT prophylaxis: Lovenox Code Status: Full code Family Communication:    Status is: Inpatient  Remains inpatient appropriate because:IV treatments appropriate due to intensity of illness or inability to take  PO  Dispo: The patient is from: Home              Anticipated d/c is to: Home              Patient currently is not medically stable to d/c.   Difficult to place patient No        Subjective: Shortness of breath is improved after thoracentesis but still having pleuritic chest pain at the chest tube insertion site.  His appetite has been very poor.  No obvious evidence of bleeding or blood loss.  Review of Systems Otherwise negative except as per HPI, including: General: Denies fever, chills, night sweats or unintended weight loss. Resp: Denies cough, wheezing, shortness of breath. Cardiac: Denies chest pain, palpitations, orthopnea, paroxysmal nocturnal dyspnea. GI: Denies abdominal pain, nausea, vomiting, diarrhea or constipation GU: Denies dysuria, frequency, hesitancy or incontinence MS: Denies muscle aches, joint pain or swelling Neuro: Denies headache, neurologic deficits (focal weakness, numbness, tingling), abnormal gait Psych: Denies anxiety, depression, SI/HI/AVH Skin: Denies new rashes or lesions ID: Denies sick contacts, exotic exposures, travel  Examination:  General exam: Appears calm and comfortable, overall appears pale and chronically ill. Respiratory system: Some bibasilar rhonchi Cardiovascular system: S1 & S2 heard, RRR. No JVD, murmurs, rubs, gallops or clicks. No pedal edema. Gastrointestinal system: Abdomen is nondistended, soft and nontender. No organomegaly or masses felt. Normal bowel sounds heard. Central nervous system: Alert and oriented. No focal neurological deficits. Extremities: Symmetric 5 x 5 power. Skin: No rashes, lesions or ulcers Psychiatry: Judgement and insight appear normal. Mood & affect appropriate.   Chest tube noted.  Objective: Vitals:   07/30/21 2200 07/30/21  2300 07/31/21 0000 07/31/21 0400  BP:  115/78 122/75 111/70  Pulse:  95 86 87  Resp:  (!) 27 (!) 24 18  Temp: (!) 97 F (36.1 C)  (!) 97 F (36.1 C) (!) 96.8 F (36  C)  TempSrc: Axillary  Oral Axillary  SpO2: 95% 94% 94% 94%  Weight:      Height:        Intake/Output Summary (Last 24 hours) at 07/31/2021 0720 Last data filed at 07/31/2021 9371 Gross per 24 hour  Intake 1377.11 ml  Output 1000 ml  Net 377.11 ml   Filed Weights   07/30/21 1101 07/30/21 1133  Weight: 95.4 kg 88.9 kg     Data Reviewed:   CBC: Recent Labs  Lab 07/30/21 0927 07/30/21 2219 07/31/21 0232  WBC 44.5* 35.1* 30.7*  NEUTROABS 41.6*  --   --   HGB 9.2* 8.3* 7.5*  HCT 28.3* 25.9* 23.4*  MCV 87.6 89.3 88.6  PLT 530* 217 696   Basic Metabolic Panel: Recent Labs  Lab 07/30/21 0927 07/30/21 1822 07/30/21 2115 07/31/21 0232  NA 135 139 136 137  K 5.5* 5.7* 4.8 4.5  CL 101 108 104 107  CO2 17* 20* 19* 22  GLUCOSE 373* 309* 143* 169*  BUN 72* 73* 69* 65*  CREATININE 2.07* 1.62* 1.48*  1.50* 1.35*  CALCIUM 10.8* 9.6 9.3 9.4   GFR: Estimated Creatinine Clearance: 63.9 mL/min (A) (by C-G formula based on SCr of 1.35 mg/dL (H)). Liver Function Tests: Recent Labs  Lab 07/30/21 0927 07/30/21 1822 07/31/21 0232  AST 20  --  14*  ALT 16  --  14  ALKPHOS 216*  --  139*  BILITOT 0.4  --  0.4  PROT 8.0 7.4 7.0  ALBUMIN 1.9*  --  2.1*   No results for input(s): LIPASE, AMYLASE in the last 168 hours. No results for input(s): AMMONIA in the last 168 hours. Coagulation Profile: Recent Labs  Lab 07/31/21 0232  INR 1.7*   Cardiac Enzymes: No results for input(s): CKTOTAL, CKMB, CKMBINDEX, TROPONINI in the last 168 hours. BNP (last 3 results) No results for input(s): PROBNP in the last 8760 hours. HbA1C: No results for input(s): HGBA1C in the last 72 hours. CBG: Recent Labs  Lab 07/31/21 0221 07/31/21 0330 07/31/21 0445 07/31/21 0557 07/31/21 0656  GLUCAP 194* 164* 168* 137* 148*   Lipid Profile: No results for input(s): CHOL, HDL, LDLCALC, TRIG, CHOLHDL, LDLDIRECT in the last 72 hours. Thyroid Function Tests: Recent Labs    07/30/21 0927   TSH 1.862   Anemia Panel: No results for input(s): VITAMINB12, FOLATE, FERRITIN, TIBC, IRON, RETICCTPCT in the last 72 hours. Sepsis Labs: Recent Labs  Lab 07/30/21 2115 07/30/21 2235 07/31/21 0232  PROCALCITON  --   --  3.21  LATICACIDVEN 1.2 2.1*  --     Recent Results (from the past 240 hour(s))  Resp Panel by RT-PCR (Flu A&B, Covid) Nasopharyngeal Swab     Status: None   Collection Time: 07/30/21 11:19 AM   Specimen: Nasopharyngeal Swab; Nasopharyngeal(NP) swabs in vial transport medium  Result Value Ref Range Status   SARS Coronavirus 2 by RT PCR NEGATIVE NEGATIVE Final    Comment: (NOTE) SARS-CoV-2 target nucleic acids are NOT DETECTED.  The SARS-CoV-2 RNA is generally detectable in upper respiratory specimens during the acute phase of infection. The lowest concentration of SARS-CoV-2 viral copies this assay can detect is 138 copies/mL. A negative result does not preclude SARS-Cov-2 infection and  should not be used as the sole basis for treatment or other patient management decisions. A negative result may occur with  improper specimen collection/handling, submission of specimen other than nasopharyngeal swab, presence of viral mutation(s) within the areas targeted by this assay, and inadequate number of viral copies(<138 copies/mL). A negative result must be combined with clinical observations, patient history, and epidemiological information. The expected result is Negative.  Fact Sheet for Patients:  EntrepreneurPulse.com.au  Fact Sheet for Healthcare Providers:  IncredibleEmployment.be  This test is no t yet approved or cleared by the [place] FDA and  has been authorized for detection and/or diagnosis of SARS-CoV-2 by FDA under an Emergency Use Authorization (EUA). This EUA will remain  in effect (meaning this test can be used) for the duration of the COVID-19 declaration under Section 564(b)(1) of the Act,  21 U.S.C.section 360bbb-3(b)(1), unless the authorization is terminated  or revoked sooner.       Influenza A by PCR NEGATIVE NEGATIVE Final   Influenza B by PCR NEGATIVE NEGATIVE Final    Comment: (NOTE) The Xpert Xpress SARS-CoV-2/FLU/RSV plus assay is intended as an aid in the diagnosis of influenza from Nasopharyngeal swab specimens and should not be used as a sole basis for treatment. Nasal washings and aspirates are unacceptable for Xpert Xpress SARS-CoV-2/FLU/RSV testing.  Fact Sheet for Patients: EntrepreneurPulse.com.au  Fact Sheet for Healthcare Providers: IncredibleEmployment.be  This test is not yet approved or cleared by the [place] FDA and has been authorized for detection and/or diagnosis of SARS-CoV-2 by FDA under an Emergency Use Authorization (EUA). This EUA will remain in effect (meaning this test can be used) for the duration of the COVID-19 declaration under Section 564(b)(1) of the Act, 21 U.S.C. section 360bbb-3(b)(1), unless the authorization is terminated or revoked.  Performed at [facility], [place] [address] [place], [place] [address]   MRSA Next Gen by PCR, Nasal     Status: None   Collection Time: 07/30/21 11:07 PM   Specimen: Nasal Mucosa; Nasal Swab  Result Value Ref Range Status   MRSA by PCR Next Gen NOT DETECTED NOT DETECTED Final    Comment: (NOTE) The GeneXpert MRSA Assay (FDA approved for NASAL specimens only), is one component of a comprehensive MRSA colonization surveillance program. It is not intended to diagnose MRSA infection nor to guide or monitor treatment for MRSA infections. Test performance is not FDA approved in patients less than 35 years old. Performed at [facility], [place] [address] [place], [place] [address]          Radiology Studies: DG Chest Port 1 View  Result Date: 07/30/2021 CLINICAL DATA:  Chest tube EXAM: PORTABLE CHEST 1  VIEW COMPARISON:  07/30/2021, CT 06/08/2021 FINDINGS: Left-sided central venous port tip over the cavoatrial region. Elevated right diaphragm. Interim placement of right lower chest drainage catheter with decreased right pleural effusion. At least small residual right pleural effusion. Right hilar and suprahilar opacity likely corresponds to CT demonstrated mass. Suspicion of right apical pneumothorax, possibly loculated. No midline shift. IMPRESSION: 1. Interim placement of right lower chest tube with decreased right pleural effusion. At least small residual right pleural collection, with suspicion of right apical small pneumothorax, possibly loculated 2. Right hilar and suprahilar opacity, corresponding to known lung mass. Electronically Signed   By: Donavan Foil M.D.   On: 07/30/2021 19:09   DG Chest Port 1 View  Result Date: 07/30/2021 CLINICAL DATA:  Shortness of breath EXAM: PORTABLE CHEST  1 VIEW COMPARISON:  Chest radiograph 04/08/2021, CT chest 06/08/2021 FINDINGS: There is a left chest wall port in place with the tip terminating in the region of the superior cavoatrial junction. The cardiomediastinal silhouette is grossly stable. Compared to the prior radiograph from 04/08/2021, there is significantly worsened opacity in the right upper lobe. Right apical pleural capping has also worsened. There is increased asymmetric elevation of the right hemidiaphragm. The left lung is clear. There is no acute osseous abnormality. IMPRESSION: 1. Compared to the prior radiograph from 04/08/2021, aeration of the right upper lobe has significantly worsened consistent with interval growth of the right upper lobe mass. Apical pleural capping has also increased, raising suspicion for a growing loculated pleural effusion. 2. Increased asymmetric elevation of the right hemidiaphragm raises suspicion for bronchial obstruction with associated volume loss. Electronically Signed   By: Valetta Mole M.D.   On: 07/30/2021 12:13         Scheduled Meds:  Chlorhexidine Gluconate Cloth  6 each Topical Daily   enoxaparin (LOVENOX) injection  40 mg Subcutaneous Q24H   influenza vac split quadrivalent PF  0.5 mL Intramuscular Tomorrow-1000   revefenacin  175 mcg Nebulization Daily   Continuous Infusions:  ceFEPime (MAXIPIME) IV Stopped (07/30/21 2332)   dextrose 5% lactated ringers 125 mL/hr at 07/31/21 0635   diltiazem (CARDIZEM) infusion 5 mg/hr (07/30/21 1709)   insulin 4 Units/hr (07/31/21 0533)   lactated ringers     lactated ringers     metronidazole Stopped (07/31/21 0328)   vancomycin       LOS: 1 day   Time spent= 35 mins    Jamee Pacholski Arsenio Loader, MD [facility]  If 7PM-7AM, please contact night-coverage  07/31/2021, 7:20 AM

## 2021-07-31 NOTE — Evaluation (Signed)
Occupational Therapy Evaluation Patient Details Name: William Farrell MRN: [id number] DOB: 1961/04/26 Today's Date: 07/31/2021   History of Present Illness 60 year old with history of stage IIIa non-small cell lung cancer on chemo, HLD, DM2 presents to the hospital for fatigue and poor oral intake for the past 2 weeks.  He was found to be in atrial fibrillation with RVR, DKA and had loculated pleural effusion. Chest tube was inserted 07/30/2021.   Clinical Impression   Mr. William Farrell is a 60 year old man who presents with generalized weakness, decreased activity tolerance and pain. Evaluation limited due to patient's pain, reluctance, and irritation with having to perform activity with presence of chest tube. Overall patient min guard for transfers and standing and max assist for LB ADLs due to pain with movement. ADLs limited to bed level as patient not tolerant to staying out of bed. Patient reports he wants to work on getting stronger and performing more activity but not so much with the presence of the chest tube. Patient educated on therapeutic process and the need to perform activity to maintain current level. Patient will benefit from skilled OT services while in hospital to improve deficits and learn compensatory strategies as needed in order to return home safely.  Currently do not recommend OT services at discharge - as he is not currently overly agreeable to acute therapy and expect that he will progress well once chest tube removed.    Recommendations for follow up therapy are one component of a multi-disciplinary discharge planning process, led by the attending physician.  Recommendations may be updated based on patient status, additional functional criteria and insurance authorization.   Follow Up Recommendations  No OT follow up    Equipment Recommendations  Other (comment) (TBD)    Recommendations for Other Services       Precautions / Restrictions  Precautions Precautions: Fall Restrictions Weight Bearing Restrictions: No      Mobility Bed Mobility Overal bed mobility: Needs Assistance Bed Mobility: Supine to Sit;Sit to Supine     Supine to sit: Min guard Sit to supine: Min guard   General bed mobility comments: Min guard for safety and management of limes.    Transfers Overall transfer level: Needs assistance Equipment used: None Transfers: Sit to/from [facility] Sit to Stand: Min guard Stand pivot transfers: Min guard       General transfer comment: min guard for standing - steps at side of bed and turning.    Balance Overall balance assessment: No apparent balance deficits (not formally assessed)                                         ADL either performed or assessed with clinical judgement   ADL Overall ADL's : Needs assistance/impaired Eating/Feeding: Independent;Bed level   Grooming: Set up;Bed level   Upper Body Bathing: Minimal assistance;Bed level;Set up   Lower Body Bathing: Maximal assistance;Bed level   Upper Body Dressing : Minimal assistance;Bed level   Lower Body Dressing: Maximal assistance;Bed level   Toilet Transfer: Min guard;BSC;Stand-pivot   Toileting- Clothing Manipulation and Hygiene: Minimal assistance;Bed level       Functional mobility during ADLs: Min guard       Vision Patient Visual Report: No change from baseline       Perception     Praxis      Pertinent Vitals/Pain Pain Assessment: Faces Faces  Pain Scale: Hurts even more Pain Location: CT site Pain Descriptors / Indicators: Grimacing;Guarding;Discomfort Pain Intervention(s): Limited activity within patient's tolerance;Patient requesting pain meds-RN notified     Hand Dominance Right   Extremity/Trunk Assessment Upper Extremity Assessment Upper Extremity Assessment: Overall WFL for tasks assessed (Exam limited by patient's intolerance. Demonstrated functional strength  with bed mobility.)   Lower Extremity Assessment Lower Extremity Assessment: Defer to PT evaluation   Cervical / Trunk Assessment Cervical / Trunk Assessment: Normal   Communication Communication Communication: No difficulties   Cognition Arousal/Alertness: Awake/alert Behavior During Therapy: WFL for tasks assessed/performed Overall Cognitive Status: Within Functional Limits for tasks assessed                                     General Comments       Exercises     Shoulder Instructions      Home Living Family/patient expects to be discharged to:: Private residence Living Arrangements: Spouse/significant other Available Help at Discharge: Family Type of Home: House Home Access: Stairs to enter [employer] of Steps: 2+1+1+3 (only the 3 have rails)   Home Layout: Two level;Bed/bath upstairs Alternate Level Stairs-Number of Steps: stair lift   Bathroom Shower/Tub: Tub/shower unit         Home Equipment: Tub bench;Cane - single point   Additional Comments: stair lift      Prior Functioning/Environment Level of Independence: Needs assistance  Gait / Transfers Assistance Needed: Very limited walking baseline from room to room with cane ADL's / Homemaking Assistance Needed: Does showers sitting on bench but takes increased time;   Comments: Working remotely from home 40 hrs a week prior to admission.        OT Problem List: Decreased strength;Decreased activity tolerance;Decreased knowledge of use of DME or AE;Pain;Cardiopulmonary status limiting activity      OT Treatment/Interventions: Self-care/ADL training;Therapeutic exercise;DME and/or AE instruction;Patient/family education;Therapeutic activities    OT Goals(Current goals can be found in the care plan section) Acute Rehab OT Goals OT Goal Formulation: With patient Time For Goal Achievement: 08/14/21 Potential to Achieve Goals: Fair  OT Frequency: Min 2X/week   Barriers to  D/C:            Co-evaluation              AM-PAC OT "6 Clicks" Daily Activity     Outcome Measure Help from another person eating meals?: None Help from another person taking care of personal grooming?: A Little Help from another person toileting, which includes using toliet, bedpan, or urinal?: A Little Help from another person bathing (including washing, rinsing, drying)?: A Lot Help from another person to put on and taking off regular upper body clothing?: A Little Help from another person to put on and taking off regular lower body clothing?: A Lot 6 Click Score: 17   End of Session Nurse Communication: Patient requests pain meds  Activity Tolerance: Patient limited by pain;Patient limited by fatigue Patient left: in bed;with call bell/phone within reach;with family/visitor present  OT Visit Diagnosis: Muscle weakness (generalized) (M62.81);Pain                Time: 1329-1400 OT Time Calculation (min): 31 min Charges:  OT General Charges $OT Visit: 1 Visit OT Evaluation $OT Eval Low Complexity: 1 Low  Quincee Gittens, OTR/L [place]  Office [phone] Pager: [place] 07/31/2021, 3:43 PM

## 2021-07-31 NOTE — Progress Notes (Signed)
Pharmacy Antibiotic Note  William Farrell is a 60 y.o. male with lung cancer on XRT and chemotherapy PTA, presented to the ED from [facility] on 07/30/2021 for evaluation of SOB, loss of appetite and increase weakness.  CXR on 9/15 showed lung mass and findings with suspicion for loculated pleural effusion.  He underwent chest tube placement for effusion and thoracentesis on 9/15.  He's currently on vancomycin, cefepime and flagyl for suspected pulmonary infection.  Today, 07/31/2021:. - day #1 abx - afeb, WBC elevated but down (received neulasta on 8/26) - PCT 3.21 - scr down 1.35 (crcl~70)  Plan: - adjust vancomycin to 1500 mg IV q24h for est AUC 408 - increase cefepime to 2gm q8h - continue flagyl 500mg  IV q12h - monitor renal function closely and adjust dose as needed _______________________________________  Height: 6' (182.9 cm) Weight: 88.9 kg (196 lb) IBW/kg (Calculated) : 77.6  Temp (24hrs), Avg:97.4 F (36.3 C), Min:96.8 F (36 C), Max:98.7 F (37.1 C)  Recent Labs  Lab 07/30/21 0927 07/30/21 1822 07/30/21 2115 07/30/21 2219 07/30/21 2235 07/31/21 0232 07/31/21 0800  WBC 44.5*  --   --  35.1*  --  30.7*  --   CREATININE 2.07* 1.62* 1.48*  1.50*  --   --  1.35* 1.23  LATICACIDVEN  --   --  1.2  --  2.1*  --   --     Estimated Creatinine Clearance: 70.1 mL/min (by C-G formula based on SCr of 1.23 mg/dL).    Allergies  Allergen Reactions   Sitagliptin Other (See Comments)    headache    9/15 Cefepime >>  9/15 Flagyl >>  9/15 Vancomycin >>  9/15 BCx x2:  9/15 right pleural fluid:  9/15 MRSA PCR: neg  Thank you for allowing pharmacy to be a part of this patient's care.  Lynelle Doctor 07/31/2021 8:34 AM

## 2021-07-31 NOTE — Progress Notes (Signed)
NAME:  William Farrell, MRN:  [id number], DOB:  05-23-1961, LOS: 1 ADMISSION DATE:  07/30/2021, CONSULTATION DATE:  9/15 REFERRING MD:  Dr Marylyn Ishihara, CHIEF COMPLAINT:  SOB/weakness   History of Present Illness:  60 year old male with prior history of Stage IIIa NSCLC (SCC) RUL diagnosed 02/2020 (RUL/suprahilar mass and R paratracheal and subcarinal LAD) s/p chemo XRT including keytruda, consolidation IT with durvalumab, SBRT to RUL nodules followed by Dr. Julien Nordmann. He is receiving neulasta support.   Presented to Dr. Julien Nordmann for follow-up, reportedly not felt well for 1-2 weeks, 15 lb weight loss since his prior visit, pain under right breast, worse with cough and progressive dyspnea on exertion even on his home O2.  He has had productive cough, orthopnea that has worsened over the last week. He has borrowed his wife's oxygen. A little hemoptysis, but no nausea, vomiting, or diarrhea.  No fever/chills.  He does acknowledge some trouble with aspiration of solids/liquids. Was due to start cycle #12 today.  Pertinent  Medical History  NSCLC stage IIIA (dx 02/2020)  Significant Hospital Events: Including procedures, antibiotic start and stop dates in addition to other pertinent events   Chest tube placement 07/30/2021  Interim History / Subjective:  No overnight events Pain is adequately controlled Still with significant effluent from chest tube  Objective   Blood pressure 111/70, pulse 87, temperature (!) 96.8 F (36 C), temperature source Axillary, resp. rate 18, height 6' (1.829 m), weight 88.9 kg, SpO2 94 %.        Intake/Output Summary (Last 24 hours) at 07/31/2021 7829 Last data filed at 07/31/2021 5621 Gross per 24 hour  Intake 1377.11 ml  Output 1000 ml  Net 377.11 ml   Filed Weights   07/30/21 1101 07/30/21 1133  Weight: 95.4 kg 88.9 kg    Examination: General: Elderly, does not appear to be in distress, chronically ill-appearing HENT: Moist oral mucosa Lungs: Decreased air  entry right greater than left Cardiovascular: S1-S2 appreciated Abdomen: Soft, bowel sounds appreciated Extremities: Trace edema Neuro: Alert and oriented GU: [facility] Problem list     Assessment & Plan:  .  Right pleural effusion -Post chest tube placement -Follow cultures -Exudative effusion, no organism at present  .  Atelectasis -Related to history of non-small cell lung cancer  .  Empyema -Over 1 L drainage -No organisms  A. fib with RVR -On diltiazem, anticoagulation  .  DKA -On Endo tool  .  Type 2 diabetes  .  Non-small cell lung cancer, postchemotherapy and radiation .  Post SBRT right upper lobe nodules -On durvalumab  Discussed with spouse at bedside Discussed with Dr. Julien Nordmann at bedside Best Practice (right click and "Reselect all SmartList Selections" daily)   Diet/type: Regular consistency (see orders) DVT prophylaxis:  GI prophylaxis: PPI Lines: N/A Foley:  N/A Code Status:  full code Last date of multidisciplinary goals of care discussion [Per primary]  Labs   CBC: Recent Labs  Lab 07/30/21 0927 07/30/21 2219 07/31/21 0232  WBC 44.5* 35.1* 30.7*  NEUTROABS 41.6*  --   --   HGB 9.2* 8.3* 7.5*  HCT 28.3* 25.9* 23.4*  MCV 87.6 89.3 88.6  PLT 530* 217 308    Basic Metabolic Panel: Recent Labs  Lab 07/30/21 0927 07/30/21 1822 07/30/21 2115 07/31/21 0232 07/31/21 0800  NA 135 139 136 137 138  K 5.5* 5.7* 4.8 4.5 4.3  CL 101 108 104 107 106  CO2 17* 20* 19* 22 21*  GLUCOSE 373* 309* 143* 169* 173*  BUN 72* 73* 69* 65* 62*  CREATININE 2.07* 1.62* 1.48*  1.50* 1.35* 1.23  CALCIUM 10.8* 9.6 9.3 9.4 9.4   GFR: Estimated Creatinine Clearance: 70.1 mL/min (by C-G formula based on SCr of 1.23 mg/dL). Recent Labs  Lab 07/30/21 0927 07/30/21 2115 07/30/21 2219 07/30/21 2235 07/31/21 0232  PROCALCITON  --   --   --   --  3.21  WBC 44.5*  --  35.1*  --  30.7*  LATICACIDVEN  --  1.2  --  2.1*  --      Liver Function Tests: Recent Labs  Lab 07/30/21 0927 07/30/21 1822 07/31/21 0232  AST 20  --  14*  ALT 16  --  14  ALKPHOS 216*  --  139*  BILITOT 0.4  --  0.4  PROT 8.0 7.4 7.0  ALBUMIN 1.9*  --  2.1*   No results for input(s): LIPASE, AMYLASE in the last 168 hours. No results for input(s): AMMONIA in the last 168 hours.  ABG    Component Value Date/Time   HCO3 18.6 (L) 07/30/2021 2235   ACIDBASEDEF 5.5 (H) 07/30/2021 2235   O2SAT 86.8 07/30/2021 2235     Coagulation Profile: Recent Labs  Lab 07/31/21 0232  INR 1.7*    Cardiac Enzymes: No results for input(s): CKTOTAL, CKMB, CKMBINDEX, TROPONINI in the last 168 hours.  HbA1C: Hemoglobin A1C  Date/Time Value Ref Range Status  06/10/2021 08:32 AM 6.8 (A) 4.0 - 5.6 % Final  03/11/2021 08:37 AM 7.5 (A) 4.0 - 5.6 % Final   Hgb A1c MFr Bld  Date/Time Value Ref Range Status  01/14/2020 08:59 AM 7.1 (H) <5.7 % of total Hgb Final    Comment:    For someone without known diabetes, a hemoglobin A1c value of 6.5% or greater indicates that they may have  diabetes and this should be confirmed with a follow-up  test. . For someone with known diabetes, a value <7% indicates  that their diabetes is well controlled and a value  greater than or equal to 7% indicates suboptimal  control. A1c targets should be individualized based on  duration of diabetes, age, comorbid conditions, and  other considerations. . Currently, no consensus exists regarding use of hemoglobin A1c for diagnosis of diabetes for children. .     CBG: Recent Labs  Lab 07/31/21 0330 07/31/21 0445 07/31/21 0557 07/31/21 0656 07/31/21 0744  GLUCAP 164* 168* 137* 148* 159*    Review of Systems:   Pain and discomfort adequately controlled  Past Medical History:  He,  has a past medical history of Asthma, Diabetes ([place]), Diverticulitis, GERD (gastroesophageal reflux disease), High cholesterol, History of kidney stones, Lung cancer ([place]),  and Wears glasses.   Surgical History:   Past Surgical History:  Procedure Laterality Date   BIOPSY  03/25/2021   Procedure: BIOPSY;  Surgeon: Lavena Bullion, DO;  Location: [facility];  Service: Gastroenterology;;   BIOPSY  06/11/2021   Procedure: BIOPSY;  Surgeon: Lavena Bullion, DO;  Location: [facility];  Service: Gastroenterology;;   broken bone repair     BRONCHIAL BIOPSY  03/11/2020   Procedure: BRONCHIAL BIOPSIES;  Surgeon: Garner Nash, DO;  Location: [facility];  Service: Pulmonary;;   COLONOSCOPY     around [place]    CRYOTHERAPY  03/11/2020   Procedure: CRYOTHERAPY;  Surgeon: Garner Nash, DO;  Location: [facility];  Service: Pulmonary;;   ESOPHAGEAL DILATION  03/25/2021  Procedure: ESOPHAGEAL DILATION;  Surgeon: Lavena Bullion, DO;  Location: [facility];  Service: Gastroenterology;;   ESOPHAGOGASTRODUODENOSCOPY (EGD) WITH PROPOFOL N/A 03/25/2021   Procedure: ESOPHAGOGASTRODUODENOSCOPY (EGD) WITH PROPOFOL;  Surgeon: Lavena Bullion, DO;  Location: [facility];  Service: Gastroenterology;  Laterality: N/A;  dialation of stricture   ESOPHAGOGASTRODUODENOSCOPY (EGD) WITH PROPOFOL N/A 06/11/2021   Procedure: ESOPHAGOGASTRODUODENOSCOPY (EGD) WITH PROPOFOL;  Surgeon: Lavena Bullion, DO;  Location: [facility];  Service: Gastroenterology;  Laterality: N/A;   FINE NEEDLE ASPIRATION  03/11/2020   Procedure: FINE NEEDLE ASPIRATION (FNA) LINEAR;  Surgeon: Garner Nash, DO;  Location: [facility];  Service: Pulmonary;;   IR IMAGING GUIDED PORT INSERTION  10/16/2020   KNEE ARTHROSCOPY WITH ANTERIOR CRUCIATE LIGAMENT (ACL) REPAIR     x 2   VIDEO BRONCHOSCOPY WITH ENDOBRONCHIAL ULTRASOUND N/A 03/11/2020   Procedure: VIDEO BRONCHOSCOPY WITH ENDOBRONCHIAL ULTRASOUND;  Surgeon: Garner Nash, DO;  Location: [facility];  Service: Pulmonary;  Laterality: N/A;     Social History:   reports that he quit smoking about 15 months ago. His smoking  use included cigarettes. He has a 86.00 pack-year smoking history. He has never used smokeless tobacco. He reports that he does not currently use alcohol. He reports that he does not currently use drugs.   Family History:  His family history includes High blood pressure in his mother; Stroke in his mother. There is no history of Colon cancer, Esophageal cancer, Stomach cancer, or Pancreatic cancer.   Allergies Allergies  Allergen Reactions   Sitagliptin Other (See Comments)    headache    Sherrilyn Rist, MD [facility] Pager: See Shea Evans

## 2021-08-01 DIAGNOSIS — R42 Dizziness and giddiness: Secondary | ICD-10-CM

## 2021-08-01 DIAGNOSIS — E1110 Type 2 diabetes mellitus with ketoacidosis without coma: Secondary | ICD-10-CM

## 2021-08-01 DIAGNOSIS — C3491 Malignant neoplasm of unspecified part of right bronchus or lung: Secondary | ICD-10-CM | POA: Diagnosis not present

## 2021-08-01 DIAGNOSIS — Z4682 Encounter for fitting and adjustment of non-vascular catheter: Secondary | ICD-10-CM

## 2021-08-01 DIAGNOSIS — E440 Moderate protein-calorie malnutrition: Secondary | ICD-10-CM | POA: Diagnosis not present

## 2021-08-01 DIAGNOSIS — Z515 Encounter for palliative care: Secondary | ICD-10-CM | POA: Diagnosis not present

## 2021-08-01 DIAGNOSIS — I4891 Unspecified atrial fibrillation: Secondary | ICD-10-CM | POA: Diagnosis not present

## 2021-08-01 DIAGNOSIS — R638 Other symptoms and signs concerning food and fluid intake: Secondary | ICD-10-CM

## 2021-08-01 DIAGNOSIS — D649 Anemia, unspecified: Secondary | ICD-10-CM

## 2021-08-01 DIAGNOSIS — Z7189 Other specified counseling: Secondary | ICD-10-CM | POA: Diagnosis not present

## 2021-08-01 DIAGNOSIS — E441 Mild protein-calorie malnutrition: Secondary | ICD-10-CM

## 2021-08-01 LAB — BASIC METABOLIC PANEL
Anion gap: 10 (ref 5–15)
BUN: 41 mg/dL — ABNORMAL HIGH (ref 6–20)
CO2: 20 mmol/L — ABNORMAL LOW (ref 22–32)
Calcium: 9.5 mg/dL (ref 8.9–10.3)
Chloride: 113 mmol/L — ABNORMAL HIGH (ref 98–111)
Creatinine, Ser: 1.06 mg/dL (ref 0.61–1.24)
GFR, Estimated: >60 mL/min (ref >60)
Glucose, Bld: 187 mg/dL — ABNORMAL HIGH (ref 70–99)
Potassium: 4.4 mmol/L (ref 3.5–5.1)
Sodium: 143 mmol/L (ref 135–145)

## 2021-08-01 LAB — CBC
HCT: 24.6 % — ABNORMAL LOW (ref 39.0–52.0)
Hemoglobin: 7.3 g/dL — ABNORMAL LOW (ref 13.0–17.0)
MCH: 27.9 pg (ref 26.0–34.0)
MCHC: 29.7 g/dL — ABNORMAL LOW (ref 30.0–36.0)
MCV: 93.9 fL (ref 80.0–100.0)
Platelets: 343 10*3/uL (ref 150–400)
RBC: 2.62 MIL/uL — ABNORMAL LOW (ref 4.22–5.81)
RDW: 20.4 % — ABNORMAL HIGH (ref 11.5–15.5)
WBC: 19.4 10*3/uL — ABNORMAL HIGH (ref 4.0–10.5)
nRBC: 0.4 % — ABNORMAL HIGH (ref 0.0–0.2)

## 2021-08-01 LAB — GLUCOSE, CAPILLARY
Glucose-Capillary: 193 mg/dL — ABNORMAL HIGH (ref 70–99)
Glucose-Capillary: 248 mg/dL — ABNORMAL HIGH (ref 70–99)
Glucose-Capillary: 193 mg/dL — ABNORMAL HIGH (ref 70–99)
Glucose-Capillary: 139 mg/dL — ABNORMAL HIGH (ref 70–99)

## 2021-08-01 LAB — MAGNESIUM: Magnesium: 2.1 mg/dL (ref 1.7–2.4)

## 2021-08-01 MED ORDER — GUAIFENESIN ER 600 MG PO TB12
600.0000 mg | ORAL_TABLET | Freq: Two times a day (BID) | ORAL | Status: DC
  Administered 2021-08-01 – 2021-08-06 (×11): 600 mg via ORAL
  Filled 2021-08-01 (×11): qty 1

## 2021-08-01 MED ORDER — FERROUS SULFATE 325 (65 FE) MG PO TABS
325.0000 mg | ORAL_TABLET | Freq: Every day | ORAL | Status: DC
  Administered 2021-08-02 – 2021-08-18 (×17): 325 mg via ORAL
  Filled 2021-08-01 (×17): qty 1

## 2021-08-01 MED ORDER — VANCOMYCIN HCL IN DEXTROSE 1-5 GM/200ML-% IV SOLN
1000.0000 mg | Freq: Two times a day (BID) | INTRAVENOUS | Status: DC
  Administered 2021-08-01 (×2): 1000 mg via INTRAVENOUS
  Filled 2021-08-01 (×2): qty 200

## 2021-08-01 MED ORDER — SODIUM CHLORIDE 0.9 % IV SOLN
250.0000 mg | Freq: Every day | INTRAVENOUS | Status: AC
  Administered 2021-08-01 – 2021-08-02 (×2): 250 mg via INTRAVENOUS
  Filled 2021-08-01 (×2): qty 20

## 2021-08-01 MED ORDER — SODIUM CHLORIDE 0.9 % IV SOLN: 510.0000 mg | Freq: Once | INTRAVENOUS | Status: DC

## 2021-08-01 NOTE — Progress Notes (Signed)
Pharmacy Antibiotic Note  William Farrell is a 60 y.o. male with lung cancer on XRT and chemotherapy PTA, presented to the ED from [facility] on 07/30/2021 for evaluation of SOB, loss of appetite and increase weakness.  CXR on 9/15 showed lung mass and findings with suspicion for loculated pleural effusion.  He underwent chest tube placement for effusion and thoracentesis on 9/15.  He's currently on vancomycin, cefepime and flagyl for suspected pulmonary infection.  Today, 08/01/2021:. - day #2 abx - afeb, WBC elevated but down (received neulasta on 8/26) - PCT 3.21 - scr trending down with hydration,  1.06 (crcl~81)  Plan: - adjust vancomycin to 1000 mg IV q12h for est AUC 434 - continue cefepime to 2gm q8h - continue flagyl 500mg  IV q12h - monitor renal function closely and adjust dose as needed _______________________________________  Height: 6' (182.9 cm) Weight: 88.9 kg (196 lb) IBW/kg (Calculated) : 77.6  Temp (24hrs), Avg:98 F (36.7 C), Min:97.4 F (36.3 C), Max:98.5 F (36.9 C)  Recent Labs  Lab 07/30/21 0927 07/30/21 1822 07/30/21 2115 07/30/21 2219 07/30/21 2235 07/31/21 0232 07/31/21 0800 08/01/21 0445  WBC 44.5*  --   --  35.1*  --  30.7*  --  19.4*  CREATININE 2.07* 1.62* 1.48*  1.50*  --   --  1.35* 1.23 1.06  LATICACIDVEN  --   --  1.2  --  2.1*  --   --   --      Estimated Creatinine Clearance: 81.3 mL/min (by C-G formula based on SCr of 1.06 mg/dL).    Allergies  Allergen Reactions   Sitagliptin Other (See Comments)    headache    9/15 Cefepime >>  9/15 Flagyl >>  9/15 Vancomycin >>  9/15 BCx x2:  9/15 right pleural fluid:  few strep intermedius 9/15 MRSA PCR: neg  Thank you for allowing pharmacy to be a part of this patient's care.  Lynelle Doctor 08/01/2021 9:48 AM

## 2021-08-01 NOTE — Consult Note (Signed)
Palliative Care Consult Note                                  Date: 08/01/2021   Patient Name: William Farrell  DOB: 12-11-1960  MRN: [id number]  Age / Sex: 60 y.o., male  PCP: [name], DO Referring Physician: Shelly Coss, MD  Reason for Consultation: Establishing goals of care  HPI/Patient Profile: 61 y.o. male  with past medical history of stage IIIa non-small cell lung cancer currently on chemotherapy, hyperlipidemia, diabetes type 2 admitted on 07/30/2021 with cc of fatigue, poor oral intake for 2 weeks prior to admission. On workup found to have AFib RVR, DKA, and loculated right pleural effusion. He is s/p chest tube placement, on antibiotics. AFib resolved to NSR after cardizem gtt, AKI at baseline, WBC trending down from loculated effusion/infection (45 -> 19 today). Anemia and malnutrition likely secondary to malignancy, dietician has seen/made recommendations. He is deconditioned and PT/OT recommending HHC PT/OT.    PMT was consulted for [place].  Past Medical History:  Diagnosis Date  . Asthma    as a child  . Diabetes ([place])   . Diverticulitis   . GERD (gastroesophageal reflux disease)   . High cholesterol   . History of kidney stones   . Lung cancer ([place])   . Wears glasses     Subjective:   This NP Walden Field reviewed medical records, received report from team, assessed the patient and then meet at the patient's bedside to discuss diagnosis, prognosis, GOC, EOL wishes disposition and options.  I met with the patient and his wife [name] at the bedside.   Concept of Palliative Care was introduced as specialized medical care for people and their families living with serious illness.  If focuses on providing relief from the symptoms and stress of a serious illness.  The goal is to improve quality of life for both the patient and the family. Values and goals of care important to patient and family were attempted to  be elicited.  Created space and opportunity for patient  and family to explore thoughts and feelings regarding current medical situation   Natural trajectory and current clinical status were discussed. Questions and concerns addressed. Patient  encouraged to call with questions or concerns.    Patient/Family Understanding of Illness: They understand he has lung cancer which is deemed not curable but is undergoing ongoing chemotherapy and treatment for stability and time.  Plan CT imaging to decide further treatment options.  They know he has had poor intake and is gotten weak because of it.  We had a discussion to summarize all the acute issues ongoing now as well as the chronic issues that are underlying.  They seem to have a good grasp on his current situation.  Life Review: He is currently working.  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  After given his diagnosis, he still had a half a truculent MD which he proceeded to do.  Patient Values: Wanting to continue treatment states "if I can keep things steady then I am okay.  He is okay even if he needs to use a walker.  He wants to continue working and expresses the need to do this for health insurance and life insurance purposes.  He does want to be functional, although he does not "need to run a marathon."  He states if I can wake up, go to the porch and relax then that is good.  He also is hoping for improvement in his cough.  Today's Discussion: We had a frank discussion on his acute and chronic situation.  We confirmed his desire for further ongoing treatment for his cancer, even from a palliative standpoint.  He is hoping for some quality of life ongoing.  We discussed the need to  set guidelines and limits.  He states that he and his wife have talked about the need to eventually do this, and states that "this might be the wake-up call to let me know I need to go ahead and get that done."  He states that this acute situation hit him quickly and hard.  Previous chemotherapy that he was on was not very tolerable.  He discusses multiple situations where he is trying to continue to live a life, do things he likes, enjoys family and friends.  We began the conversations on CODE STATUS and other decisions (such as tube feedings).  They expressed a desire to continue to talk amongst themselves and think about this.  I confirmed that no decision is needed immediately.  They indicated they would continue the conversation.  I offered support, reassurance.  I provided a copy of "hard choices for loving people" to help them with their discussions.  I answered all questions and addressed all concerns.  I encouraged them to call with any questions.  Review of Systems  Constitutional:  Positive for activity change, appetite change and fatigue.  Respiratory:  Positive for cough and shortness of breath.   Cardiovascular:  Positive for chest pain and palpitations (previous, now improved).  Gastrointestinal:  Negative for abdominal pain and nausea.  Genitourinary:        Shoulder pain   Objective:   Primary Diagnoses: Present on Admission: . Atrial fibrillation with rapid ventricular response [facility])   Physical Exam Vitals and nursing note reviewed.  Constitutional:      General: He is not in acute distress.    Appearance: He is normal weight. He is ill-appearing.  HENT:     Head: Normocephalic and atraumatic.  Pulmonary:     Effort: Pulmonary effort is normal. No respiratory distress.     Comments: Noted frequent cough; appears winded after coughing or prolonged talking Abdominal:     General: Abdomen is flat.     Palpations: Abdomen is soft.  Skin:    General: Skin is warm and  dry.     Comments: Noted chest tube right posterior chest with purulent appearing drainage in the tubing  Neurological:     General: No focal deficit present.     Mental Status: He is alert.  Psychiatric:        Mood and Affect: Mood normal.        Behavior: Behavior normal.    Vital Signs:  BP 118/75 (BP Location: Right Arm)   Pulse 97   Temp 98.2 F (36.8 C) (Oral)   Resp (!) 27   Ht 6' (1.829 m)   Wt 88.9 kg   SpO2 100%   BMI 26.58 kg/m   Palliative Assessment/Data: 40%    Advanced Care Planning:   Primary Decision Maker: PATIENT  Code Status/Advance Care Planning: Full code  A discussion was had today regarding advanced directives. Concepts specific to code status, artifical feeding and hydration, continued IV antibiotics and rehospitalization was had.  The difference between a aggressive medical intervention path and a palliative comfort care path for this patient at this time was had.  Decisions/Changes to ACP: Remain full code for now Wanting time to discuss decisions with family about code status  Assessment & Plan:   Impression: 60 year old male with multiple chronic issues including stage III vs IV lung CA admitted for multiple acute issues: loculated pleural effusion, DKA, A. fib with RVR, anemia, malnutrition, deconditioning.  Clinically he appears to making slow improvement.  Still having purulent drainage from his chest tube.  Persistent cough which is troublesome and uncomfortable for him.  Currently being managed by PCCM.  Overall prognosis is guarded/poor.  Awaiting CT imaging to decide further treatment options.  If limited to no treatment options ongoing, may need to begin discussions on end-of-life care or hospice. For the time being, continue to treat the treatable.  We began discussions on goals of care including CODE STATUS.  They are requesting time to have further discussions as a family and start making decisions.  SUMMARY OF RECOMMENDATIONS    Continue to treat the treatable Wanting to continue chemo treatments CT to decide what further treatments are available Continue GOC discussions as family able to discuss their thoughts PMT will continue to follow  Symptom Management:  Per primary team PMT available to assist as needed  Prognosis:  Unable to determine  Discharge Planning:  To Be Determined   Discussed with: Patient, family, nursing staff, Dr. Tawanna Solo, Dr. Julien Nordmann    Thank you for allowing [place] to participate in the care of [name] PMT will continue to support holistically.  Time Total: 70 min  Greater than 50%  of this time was spent counseling and coordinating care related to the above assessment and plan.  Signed by: Walden Field, NP Palliative Medicine Team  Team Phone # [phone] (Nights/Weekends)  08/01/2021, 9:47 AM

## 2021-08-01 NOTE — Progress Notes (Signed)
PROGRESS NOTE    William Farrell  XFG:[id number] DOB: 08/07/1961 DOA: 07/30/2021 PCP: [name], DO   Chief Complain: Fatigue, poor oral intake  Brief Narrative: Patient is a 60 year old male with history of stage IIIa non-small cell lung cancer currently on chemotherapy, hyperlipidemia, diabetes type 2 who presented here with complaints of fatigue, poor oral intake for 2 weeks before admission.  On presentation he was found to be in A. fib with RVR, DKA and also found to have loculated right-sided pleural effusion.  PCCM was consulted on presentation, status post right chest tube placement.  Currently on broad-spectrum antibiotics for loculated right-sided pleural effusion.  Oncology following.  A. fib with RVR has resolved and currently in normal sinus rhythm.  Assessment & Plan:   Active Problems:   Atrial fibrillation with rapid ventricular response (HCC)   Malnutrition of moderate degree   Sepsis secondary to loculated right-sided pleural effusion: Presented with shortness of breath.  Status post chest tube placement on 9/15.  Procalcitonin was elevated.  Still has significant leukocytosis, afebrile.  Currently on broad-spectrum antibiotics with vancomycin, cefepime, Flagyl.  Pleural fluid culture showing stable coccus intermedius, will follow final culture report.  Blood cultures have remained negative so far.  This morning he was on room air.  Still having significant productive cough.  Stage III non-small cell lung cancer: Follows with Dr. Julien Nordmann, currently on chemo.  Planning for restaging imagings as per oncology.  Oncology following.  A. fib with RVR: Likely transient, now resolved after starting on Cardizem drip.  Currently in normal sinus rhythm.  TSH normal.  Echocardiogram showed EF of 60 to 65%, normal left ventricular diastolic parameters.  Not a started on anticoagulation because of transient nature of the A. fib with RVR/likely related to pulmonary  issues.  DKA/diabetes type 2: Initially started on insulin drip now transition to subcutaneous insulin.  Takes metformin, glipizide, Ozempic at home.  Monitor blood sugars.  Continue current insulin regimen.  Hemoglobin A1c of 6.6.  AKI on CKD stage IIIa: Creatinine was in the range of 2 on presentation.  Baseline creatinine around 1.3.  Currently kidney function at baseline.  Chronic normocytic anemia: Baseline hemoglobin around 8.  Hemoglobin dropped to the range of 7.  No evidence of acute blood loss.  Most likely associated with underlying malignancy, chemotherapy.  Iron studies showed low iron, needs IV iron infusion.  Hyperlipidemia: On statin  Hypothyroidism: On Synthyroid  GERD: Continue PPI  Moderate malnutrition: Secondary to malignancy.  Nutrition following  Debility/deconditioning: PT/OT evaluated the patient and recommend home health on discharge.  Nutrition Problem: Moderate Malnutrition Etiology: chronic illness, cancer and cancer related treatments      DVT prophylaxis:Lovenox Code Status: Full Family Communication: Wife present at bedside Status is: Inpatient  Remains inpatient appropriate because:Inpatient level of care appropriate due to severity of illness  Dispo: The patient is from: Home              Anticipated d/c is to: Home              Patient currently is not medically stable to d/c.   Difficult to place patient No    Consultants: Oncology,PCCM  Procedures:Chest tube placement  Antimicrobials:  Anti-infectives (From admission, onward)    Start     Dose/Rate Route Frequency Ordered Stop   07/31/21 1800  vancomycin (VANCOREADY) IVPB 1250 mg/250 mL  Status:  Discontinued        1,250 mg 166.7 mL/hr over 90 Minutes Intravenous Every  24 hours 07/30/21 1911 07/31/21 0845   07/31/21 1800  vancomycin (VANCOREADY) IVPB 1500 mg/300 mL        1,500 mg 150 mL/hr over 120 Minutes Intravenous Every 24 hours 07/31/21 0845     07/31/21 0945  ceFEPIme  (MAXIPIME) 2 g in sodium chloride 0.9 % 100 mL IVPB        2 g 200 mL/hr over 30 Minutes Intravenous Every 8 hours 07/31/21 0845     07/30/21 2000  metroNIDAZOLE (FLAGYL) IVPB 500 mg        500 mg 100 mL/hr over 60 Minutes Intravenous Every 12 hours 07/30/21 1738     07/30/21 1830  vancomycin (VANCOREADY) IVPB 1750 mg/350 mL        1,750 mg 175 mL/hr over 120 Minutes Intravenous  Once 07/30/21 1738 07/30/21 1846   07/30/21 1800  ceFEPIme (MAXIPIME) 2 g in sodium chloride 0.9 % 100 mL IVPB  Status:  Discontinued        2 g 200 mL/hr over 30 Minutes Intravenous Every 12 hours 07/30/21 1723 07/31/21 0845       Subjective: Patient seen and examined at the bedside this morning.  Hemodynamically stable during my evaluation.  Wife at the bedside.  He was on room air when I saw him.  He was coughing.  Looks pale.  Denies any acute shortness of breath or chest pain.  Objective: Vitals:   08/01/21 0400 08/01/21 0600 08/01/21 0755 08/01/21 0800  BP: 118/72 111/62  118/75  Pulse: 95 95  97  Resp: 11 16  (!) 27  Temp: 98.5 F (36.9 C)   98.2 F (36.8 C)  TempSrc: Axillary   Oral  SpO2: 96% 95% 95% 100%  Weight:      Height:        Intake/Output Summary (Last 24 hours) at 08/01/2021 0851 Last data filed at 08/01/2021 0700 Gross per 24 hour  Intake 2893.84 ml  Output 2350 ml  Net 543.84 ml   Filed Weights   07/30/21 1101 07/30/21 1133  Weight: 95.4 kg 88.9 kg    Examination:  General exam: Overall comfortable, not in distress, chronically ill looking HEENT: PERRL Respiratory system: Diminished air sounds on the right chest, no wheezes or crackles  Cardiovascular system: S1 & S2 heard, RRR.  Gastrointestinal system: Abdomen is nondistended, soft and nontender. Central nervous system: Alert and oriented Extremities: No edema, no clubbing ,no cyanosis Skin: No rashes, no ulcers,no icterus  , pallor   Data Reviewed: I have personally reviewed following labs and imaging  studies  CBC: Recent Labs  Lab 07/30/21 0927 07/30/21 2219 07/31/21 0232 07/31/21 0900 08/01/21 0445  WBC 44.5* 35.1* 30.7*  --  19.4*  NEUTROABS 41.6*  --   --   --   --   HGB 9.2* 8.3* 7.5* 7.0* 7.3*  HCT 28.3* 25.9* 23.4* 22.5* 24.6*  MCV 87.6 89.3 88.6  --  93.9  PLT 530* 217 380  --  992   Basic Metabolic Panel: Recent Labs  Lab 07/30/21 1822 07/30/21 2115 07/31/21 0232 07/31/21 0800 08/01/21 0445  NA 139 136 137 138 143  K 5.7* 4.8 4.5 4.3 4.4  CL 108 104 107 106 113*  CO2 20* 19* 22 21* 20*  GLUCOSE 309* 143* 169* 173* 187*  BUN 73* 69* 65* 62* 41*  CREATININE 1.62* 1.48*  1.50* 1.35* 1.23 1.06  CALCIUM 9.6 9.3 9.4 9.4 9.5  MG  --   --   --   --  2.1   GFR: Estimated Creatinine Clearance: 81.3 mL/min (by C-G formula based on SCr of 1.06 mg/dL). Liver Function Tests: Recent Labs  Lab 07/30/21 0927 07/30/21 1822 07/31/21 0232  AST 20  --  14*  ALT 16  --  14  ALKPHOS 216*  --  139*  BILITOT 0.4  --  0.4  PROT 8.0 7.4 7.0  ALBUMIN 1.9*  --  2.1*   No results for input(s): LIPASE, AMYLASE in the last 168 hours. No results for input(s): AMMONIA in the last 168 hours. Coagulation Profile: Recent Labs  Lab 07/31/21 0232  INR 1.7*   Cardiac Enzymes: No results for input(s): CKTOTAL, CKMB, CKMBINDEX, TROPONINI in the last 168 hours. BNP (last 3 results) No results for input(s): PROBNP in the last 8760 hours. HbA1C: Recent Labs    07/31/21 0800  HGBA1C 6.6*   CBG: Recent Labs  Lab 07/31/21 0917 07/31/21 1124 07/31/21 1805 07/31/21 2137 08/01/21 0725  GLUCAP 158* 217* 248* 244* 193*   Lipid Profile: No results for input(s): CHOL, HDL, LDLCALC, TRIG, CHOLHDL, LDLDIRECT in the last 72 hours. Thyroid Function Tests: Recent Labs    07/31/21 0800  TSH 2.355   Anemia Panel: Recent Labs    07/31/21 0800  FERRITIN 1,352*  TIBC 174*  IRON 25*   Sepsis Labs: Recent Labs  Lab 07/30/21 2115 07/30/21 2235 07/31/21 0232  PROCALCITON   --   --  3.21  LATICACIDVEN 1.2 2.1*  --     Recent Results (from the past 240 hour(s))  Resp Panel by RT-PCR (Flu A&B, Covid) Nasopharyngeal Swab     Status: None   Collection Time: 07/30/21 11:19 AM   Specimen: Nasopharyngeal Swab; Nasopharyngeal(NP) swabs in vial transport medium  Result Value Ref Range Status   SARS Coronavirus 2 by RT PCR NEGATIVE NEGATIVE Final    Comment: (NOTE) SARS-CoV-2 target nucleic acids are NOT DETECTED.  The SARS-CoV-2 RNA is generally detectable in upper respiratory specimens during the acute phase of infection. The lowest concentration of SARS-CoV-2 viral copies this assay can detect is 138 copies/mL. A negative result does not preclude SARS-Cov-2 infection and should not be used as the sole basis for treatment or other patient management decisions. A negative result may occur with  improper specimen collection/handling, submission of specimen other than nasopharyngeal swab, presence of viral mutation(s) within the areas targeted by this assay, and inadequate number of viral copies(<138 copies/mL). A negative result must be combined with clinical observations, patient history, and epidemiological information. The expected result is Negative.  Fact Sheet for Patients:  EntrepreneurPulse.com.au  Fact Sheet for Healthcare Providers:  IncredibleEmployment.be  This test is no t yet approved or cleared by the [place] FDA and  has been authorized for detection and/or diagnosis of SARS-CoV-2 by FDA under an Emergency Use Authorization (EUA). This EUA will remain  in effect (meaning this test can be used) for the duration of the COVID-19 declaration under Section 564(b)(1) of the Act, 21 U.S.C.section 360bbb-3(b)(1), unless the authorization is terminated  or revoked sooner.       Influenza A by PCR NEGATIVE NEGATIVE Final   Influenza B by PCR NEGATIVE NEGATIVE Final    Comment: (NOTE) The Xpert Xpress  SARS-CoV-2/FLU/RSV plus assay is intended as an aid in the diagnosis of influenza from Nasopharyngeal swab specimens and should not be used as a sole basis for treatment. Nasal washings and aspirates are unacceptable for Xpert Xpress SARS-CoV-2/FLU/RSV testing.  Fact Sheet for Patients: EntrepreneurPulse.com.au  Fact Sheet for Healthcare Providers: IncredibleEmployment.be  This test is not yet approved or cleared by the [place] FDA and has been authorized for detection and/or diagnosis of SARS-CoV-2 by FDA under an Emergency Use Authorization (EUA). This EUA will remain in effect (meaning this test can be used) for the duration of the COVID-19 declaration under Section 564(b)(1) of the Act, 21 U.S.C. section 360bbb-3(b)(1), unless the authorization is terminated or revoked.  Performed at [facility], [place] [address], [place], [place] [address]   Body fluid culture w Gram Stain     Status: None (Preliminary result)   Collection Time: 07/30/21  5:35 PM   Specimen: Pleura; Body Fluid  Result Value Ref Range Status   Specimen Description   Final    PLEURAL RIGHT Performed at [facility] [address], [place], [place] [address]    Special Requests   Final    NONE Performed at [facility], [place] [address], [place], [place] [address]    Gram Stain   Final    ABUNDANT WBC PRESENT,BOTH PMN AND MONONUCLEAR FEW GRAM POSITIVE COCCI    Culture   Final    FEW STREPTOCOCCUS INTERMEDIUS CULTURE REINCUBATED FOR BETTER GROWTH Performed at [facility], [place] [address], [place], [place] [address]    Report Status PENDING  Incomplete  MRSA Next Gen by PCR, Nasal     Status: None   Collection Time: 07/30/21 11:07 PM   Specimen: Nasal Mucosa; Nasal Swab  Result Value Ref Range Status   MRSA by PCR Next Gen NOT DETECTED NOT DETECTED Final    Comment: (NOTE) The GeneXpert MRSA Assay  (FDA approved for NASAL specimens only), is one component of a comprehensive MRSA colonization surveillance program. It is not intended to diagnose MRSA infection nor to guide or monitor treatment for MRSA infections. Test performance is not FDA approved in patients less than 22 years old. Performed at [facility], [place] [address], [place], [place] [address]          Radiology Studies: [facility] Chest Port 1 View  Result Date: 07/31/2021 CLINICAL DATA:  Pleural effusion, chest tube placement. EXAM: PORTABLE CHEST 1 VIEW COMPARISON:  July 30, 2021. FINDINGS: LEFT-sided central venous access device terminates at the caval cavoatrial junction, port projecting over the LEFT chest. RIGHT sided chest tube is in place. In similar position. Tube along the medial RIGHT chest entering via RIGHT lateral chest. RIGHT hemidiaphragm remains elevated. EKG leads project over the chest. Cardiomediastinal contours and hilar structures are stable. Known mass in the RIGHT upper lobe and associated volume loss unchanged from the recent radiograph from July 30, 2021. Small apical pneumothorax is similar to the prior study. Lungs are otherwise clear. Small effusion likely along the RIGHT lung apex. On limited assessment there is no acute skeletal process. IMPRESSION: Stable chest x-ray. Known RIGHT upper lobe mass, volume loss and small RIGHT apical pneumothorax with RIGHT chest tube in place. Electronically Signed   By: Zetta Bills M.D.   On: 07/31/2021 08:06   DG Chest Port 1 View  Result Date: 07/30/2021 CLINICAL DATA:  Chest tube EXAM: PORTABLE CHEST 1 VIEW COMPARISON:  07/30/2021, CT 06/08/2021 FINDINGS: Left-sided central venous port tip over the cavoatrial region. Elevated right diaphragm. Interim placement of right lower chest drainage catheter with decreased right pleural effusion. At least small residual right pleural effusion. Right hilar and suprahilar opacity likely corresponds to  CT demonstrated mass. Suspicion  of right apical pneumothorax, possibly loculated. No midline shift. IMPRESSION: 1. Interim placement of right lower chest tube with decreased right pleural effusion. At least small residual right pleural collection, with suspicion of right apical small pneumothorax, possibly loculated 2. Right hilar and suprahilar opacity, corresponding to known lung mass. Electronically Signed   By: Donavan Foil M.D.   On: 07/30/2021 19:09   DG Chest Port 1 View  Result Date: 07/30/2021 CLINICAL DATA:  Shortness of breath EXAM: PORTABLE CHEST 1 VIEW COMPARISON:  Chest radiograph 04/08/2021, CT chest 06/08/2021 FINDINGS: There is a left chest wall port in place with the tip terminating in the region of the superior cavoatrial junction. The cardiomediastinal silhouette is grossly stable. Compared to the prior radiograph from 04/08/2021, there is significantly worsened opacity in the right upper lobe. Right apical pleural capping has also worsened. There is increased asymmetric elevation of the right hemidiaphragm. The left lung is clear. There is no acute osseous abnormality. IMPRESSION: 1. Compared to the prior radiograph from 04/08/2021, aeration of the right upper lobe has significantly worsened consistent with interval growth of the right upper lobe mass. Apical pleural capping has also increased, raising suspicion for a growing loculated pleural effusion. 2. Increased asymmetric elevation of the right hemidiaphragm raises suspicion for bronchial obstruction with associated volume loss. Electronically Signed   By: Valetta Mole M.D.   On: 07/30/2021 12:13   ECHOCARDIOGRAM COMPLETE  Result Date: 07/31/2021    ECHOCARDIOGRAM REPORT   Patient Name:   William Farrell Date of Exam: 07/31/2021 Medical Rec #:  [id number]          Height:       72.0 in Accession #:    [id number]         Weight:       196.0 lb Date of Birth:  September 03, 1961         BSA:          2.112 m Patient Age:    69 years            BP:           125/65 mmHg Patient Gender: M                  HR:           85 bpm. Exam Location:  Inpatient Procedure: 2D Echo, Cardiac Doppler and Color Doppler Indications:    I42.9 Cardiomyopathy (unspecified)  History:        Patient has no prior history of Echocardiogram examinations.                 Arrythmias:Atrial Fibrillation; Risk Factors:Diabetes.  Sonographer:    MH Referring Phys: [id number] ANKIT CHIRAG AMIN IMPRESSIONS  1. Left ventricular ejection fraction, by estimation, is 60 to 65%. The left ventricle has normal function. The left ventricle has no regional wall motion abnormalities. There is mild concentric left ventricular hypertrophy. Left ventricular diastolic parameters were normal.  2. Right ventricular systolic function is normal. The right ventricular size is normal. Tricuspid regurgitation signal is inadequate for assessing PA pressure.  3. The mitral valve is normal in structure. No evidence of mitral valve regurgitation. No evidence of mitral stenosis.  4. The aortic valve is tricuspid. Aortic valve regurgitation is not visualized. No aortic stenosis is present.  5. The inferior vena cava is dilated in size with >50% respiratory variability, suggesting right atrial pressure of 8 mmHg. Comparison(s): No prior Echocardiogram. FINDINGS  Left Ventricle: Left ventricular  ejection fraction, by estimation, is 60 to 65%. The left ventricle has normal function. The left ventricle has no regional wall motion abnormalities. The left ventricular internal cavity size was small. There is mild concentric left ventricular hypertrophy. Left ventricular diastolic parameters were normal. Right Ventricle: The right ventricular size is normal. No increase in right ventricular wall thickness. Right ventricular systolic function is normal. Tricuspid regurgitation signal is inadequate for assessing PA pressure. Left Atrium: Left atrial size was normal in size. Right Atrium: Right atrial size was normal in  size. Pericardium: There is no evidence of pericardial effusion. Mitral Valve: The mitral valve is normal in structure. No evidence of mitral valve regurgitation. No evidence of mitral valve stenosis. Tricuspid Valve: The tricuspid valve is normal in structure. Tricuspid valve regurgitation is not demonstrated. No evidence of tricuspid stenosis. Aortic Valve: The aortic valve is tricuspid. Aortic valve regurgitation is not visualized. No aortic stenosis is present. Aortic valve mean gradient measures 4.0 mmHg. Aortic valve peak gradient measures 6.2 mmHg. Aortic valve area, by VTI measures 3.44 cm. Pulmonic Valve: The pulmonic valve was not well visualized. Pulmonic valve regurgitation is not visualized. No evidence of pulmonic stenosis. Aorta: The aortic root and ascending aorta are structurally normal, with no evidence of dilitation. Venous: The inferior vena cava is dilated in size with greater than 50% respiratory variability, suggesting right atrial pressure of 8 mmHg. IAS/Shunts: The atrial septum is grossly normal.  LEFT VENTRICLE PLAX 2D LVIDd:         3.70 cm     Diastology LVIDs:         2.40 cm     LV e' medial:    9.14 cm/s LV PW:         1.40 cm     LV E/e' medial:  9.0 LV IVS:        1.50 cm     LV e' lateral:   12.80 cm/s LVOT diam:     2.40 cm     LV E/e' lateral: 6.5 LV SV:         88 LV SV Index:   42 LVOT Area:     4.52 cm  LV Volumes (MOD) LV vol d, MOD A4C: 46.1 ml LV vol s, MOD A4C: 16.7 ml LV SV MOD A4C:     46.1 ml RIGHT VENTRICLE             IVC RV S prime:     13.10 cm/s  IVC diam: 2.20 cm TAPSE (M-mode): 2.1 cm LEFT ATRIUM           Index LA diam:      2.70 cm 1.28 cm/m LA Vol (A2C): 13.4 ml 6.34 ml/m LA Vol (A4C): 21.7 ml 10.27 ml/m  AORTIC VALVE                   PULMONIC VALVE AV Area (Vmax):    3.53 cm    PV Vmax:       0.73 m/s AV Area (Vmean):   3.27 cm    PV Peak grad:  2.1 mmHg AV Area (VTI):     3.44 cm AV Vmax:           125.00 cm/s AV Vmean:          99.600 cm/s AV VTI:             0.255 m AV Peak Grad:      6.2 mmHg AV Mean Grad:  4.0 mmHg LVOT Vmax:         97.50 cm/s LVOT Vmean:        71.900 cm/s LVOT VTI:          0.194 m LVOT/AV VTI ratio: 0.76  AORTA Ao Root diam: 3.80 cm Ao Asc diam:  3.30 cm MITRAL VALVE MV Area (PHT): 4.49 cm    SHUNTS MV E velocity: 82.60 cm/s  Systemic VTI:  0.19 m MV A velocity: 62.40 cm/s  Systemic Diam: 2.40 cm MV E/A ratio:  1.32 Rudean Haskell MD Electronically signed by Rudean Haskell MD Signature Date/Time: 07/31/2021/3:23:52 PM    Final         Scheduled Meds:  aspirin EC  81 mg Oral QHS   atorvastatin  40 mg Oral QHS   Chlorhexidine Gluconate Cloth  6 each Topical Daily   enoxaparin (LOVENOX) injection  40 mg Subcutaneous Q24H   feeding supplement  1 Container Oral TID BM   insulin aspart  0-15 Units Subcutaneous TID WC   insulin aspart  0-5 Units Subcutaneous QHS   insulin glargine-yfgn  20 Units Subcutaneous Daily   levothyroxine  75 mcg Oral Q0600   lidocaine  1 patch Transdermal Daily   multivitamin with minerals  1 tablet Oral Daily   pantoprazole  40 mg Oral Daily   revefenacin  175 mcg Nebulization Daily   senna-docusate  2 tablet Oral QHS   Continuous Infusions:  ceFEPime (MAXIPIME) IV Stopped (08/01/21 0340)   dextrose 5% lactated ringers 75 mL/hr at 08/01/21 0822   diltiazem (CARDIZEM) infusion Stopped (07/31/21 0931)   lactated ringers     lactated ringers     metronidazole 500 mg (08/01/21 0828)   vancomycin Stopped (07/31/21 2050)     LOS: 2 days    Time spent:35 mins, More than 50% of that time was spent in counseling and/or coordination of care.      Shelly Coss, MD [facility] P9/17/2022, 8:51 AM

## 2021-08-01 NOTE — Progress Notes (Signed)
NAME:  William Farrell, MRN:  [id number], DOB:  1961/11/08, LOS: 2 ADMISSION DATE:  07/30/2021, CONSULTATION DATE:  9/15 REFERRING MD:  Dr Marylyn Ishihara, CHIEF COMPLAINT:  SOB/weakness   History of Present Illness:  60 year old male with prior history of Stage IIIa NSCLC (SCC) RUL diagnosed 02/2020 (RUL/suprahilar mass and R paratracheal and subcarinal LAD) s/p chemo XRT including keytruda, consolidation IT with durvalumab, SBRT to RUL nodules followed by Dr. Julien Nordmann. He is receiving neulasta support.   Presented to Dr. Julien Nordmann for follow-up, reportedly not felt well for 1-2 weeks, 15 lb weight loss since his prior visit, pain under right breast, worse with cough and progressive dyspnea on exertion even on his home O2.  He has had productive cough, orthopnea that has worsened over the last week. He has borrowed his wife's oxygen. A little hemoptysis, but no nausea, vomiting, or diarrhea.  No fever/chills.  He does acknowledge some trouble with aspiration of solids/liquids. Was due to start cycle #12 today.  Pertinent  Medical History  NSCLC stage IIIA (dx 02/2020)  Significant Hospital Events: Including procedures, antibiotic start and stop dates in addition to other pertinent events   Chest tube placement 07/30/2021  Interim History / Subjective:  No overnight events Still with significant effluent from chest tube  Objective   Blood pressure 111/62, pulse 95, temperature 98.5 F (36.9 C), temperature source Axillary, resp. rate 16, height 6' (1.829 m), weight 88.9 kg, SpO2 95 %.        Intake/Output Summary (Last 24 hours) at 08/01/2021 0759 Last data filed at 08/01/2021 0700 Gross per 24 hour  Intake 3133.84 ml  Output 2650 ml  Net 483.84 ml   Filed Weights   07/30/21 1101 07/30/21 1133  Weight: 95.4 kg 88.9 kg    Examination: General: Does not appear to be in distress  HENT: Moist oral mucosa Lungs: Decreased air entry at the right base Cardiovascular: S1-S2 appreciated Abdomen:  Soft, bowel sounds appreciated Extremities: Trace edema Neuro: Alert and oriented GU: [facility] Problem list     Assessment & Plan:  .  Right pleural effusion -Post chest tube placement -Exudative effusion, no organism and cultures  .  Atelectasis -History of non-small cell lung cancer  .  Leukocytosis -Improving  Non-small cell lung cancer -Oncology following -Plan for CT chest with contrast -Will order CT scan once fluid volume decreases  Empyema -About 350 cc in the last 24 hours -Continue to monitor -On vancomycin, cefepime  .  A. fib with RVR -Appears to have been transient -Off anticoagulation  .  DKA .  Type 2 diabetes -Resolved -SSI  Discussed with spouse at bedside  Tentative plan for CT scan of the chest on Monday  Best Practice (right click and "Reselect all SmartList Selections" daily)   Diet/type: Regular consistency (see orders) DVT prophylaxis:  GI prophylaxis: PPI Lines: N/A Foley:  N/A Code Status:  full code Last date of multidisciplinary goals of care discussion [Per primary]  Labs   CBC: Recent Labs  Lab 07/30/21 0927 07/30/21 2219 07/31/21 0232 07/31/21 0900 08/01/21 0445  WBC 44.5* 35.1* 30.7*  --  19.4*  NEUTROABS 41.6*  --   --   --   --   HGB 9.2* 8.3* 7.5* 7.0* 7.3*  HCT 28.3* 25.9* 23.4* 22.5* 24.6*  MCV 87.6 89.3 88.6  --  93.9  PLT 530* 217 380  --  409    Basic Metabolic Panel: Recent Labs  Lab 07/30/21  1822 07/30/21 2115 07/31/21 0232 07/31/21 0800 08/01/21 0445  NA 139 136 137 138 143  K 5.7* 4.8 4.5 4.3 4.4  CL 108 104 107 106 113*  CO2 20* 19* 22 21* 20*  GLUCOSE 309* 143* 169* 173* 187*  BUN 73* 69* 65* 62* 41*  CREATININE 1.62* 1.48*  1.50* 1.35* 1.23 1.06  CALCIUM 9.6 9.3 9.4 9.4 9.5  MG  --   --   --   --  2.1   GFR: Estimated Creatinine Clearance: 81.3 mL/min (by C-G formula based on SCr of 1.06 mg/dL). Recent Labs  Lab 07/30/21 0927 07/30/21 2115 07/30/21 2219  07/30/21 2235 07/31/21 0232 08/01/21 0445  PROCALCITON  --   --   --   --  3.21  --   WBC 44.5*  --  35.1*  --  30.7* 19.4*  LATICACIDVEN  --  1.2  --  2.1*  --   --     Liver Function Tests: Recent Labs  Lab 07/30/21 0927 07/30/21 1822 07/31/21 0232  AST 20  --  14*  ALT 16  --  14  ALKPHOS 216*  --  139*  BILITOT 0.4  --  0.4  PROT 8.0 7.4 7.0  ALBUMIN 1.9*  --  2.1*   No results for input(s): LIPASE, AMYLASE in the last 168 hours. No results for input(s): AMMONIA in the last 168 hours.  ABG    Component Value Date/Time   HCO3 18.6 (L) 07/30/2021 2235   ACIDBASEDEF 5.5 (H) 07/30/2021 2235   O2SAT 86.8 07/30/2021 2235     Coagulation Profile: Recent Labs  Lab 07/31/21 0232  INR 1.7*    Cardiac Enzymes: No results for input(s): CKTOTAL, CKMB, CKMBINDEX, TROPONINI in the last 168 hours.  HbA1C: Hemoglobin A1C  Date/Time Value Ref Range Status  06/10/2021 08:32 AM 6.8 (A) 4.0 - 5.6 % Final  03/11/2021 08:37 AM 7.5 (A) 4.0 - 5.6 % Final   Hgb A1c MFr Bld  Date/Time Value Ref Range Status  07/31/2021 08:00 AM 6.6 (H) 4.8 - 5.6 % Final    Comment:    (NOTE) Pre diabetes:          5.7%-6.4%  Diabetes:              >6.4%  Glycemic control for   <7.0% adults with diabetes   01/14/2020 08:59 AM 7.1 (H) <5.7 % of total Hgb Final    Comment:    For someone without known diabetes, a hemoglobin A1c value of 6.5% or greater indicates that they may have  diabetes and this should be confirmed with a follow-up  test. . For someone with known diabetes, a value <7% indicates  that their diabetes is well controlled and a value  greater than or equal to 7% indicates suboptimal  control. A1c targets should be individualized based on  duration of diabetes, age, comorbid conditions, and  other considerations. . Currently, no consensus exists regarding use of hemoglobin A1c for diagnosis of diabetes for children. .     CBG: Recent Labs  Lab 07/31/21 0917  07/31/21 1124 07/31/21 1805 07/31/21 2137 08/01/21 0725  GLUCAP 158* 217* 248* 244* 193*    Review of Systems:   Pain and discomfort adequately controlled  Past Medical History:  He,  has a past medical history of Asthma, Diabetes ([place]), Diverticulitis, GERD (gastroesophageal reflux disease), High cholesterol, History of kidney stones, Lung cancer ([place]), and Wears glasses.   Surgical History:   Past Surgical  History:  Procedure Laterality Date   BIOPSY  03/25/2021   Procedure: BIOPSY;  Surgeon: Lavena Bullion, DO;  Location: [facility];  Service: Gastroenterology;;   BIOPSY  06/11/2021   Procedure: BIOPSY;  Surgeon: Lavena Bullion, DO;  Location: [facility];  Service: Gastroenterology;;   broken bone repair     BRONCHIAL BIOPSY  03/11/2020   Procedure: BRONCHIAL BIOPSIES;  Surgeon: Garner Nash, DO;  Location: [facility];  Service: Pulmonary;;   COLONOSCOPY     around 61[place]    CRYOTHERAPY  03/11/2020   Procedure: CRYOTHERAPY;  Surgeon: Garner Nash, DO;  Location: [facility];  Service: Pulmonary;;   ESOPHAGEAL DILATION  03/25/2021   Procedure: ESOPHAGEAL DILATION;  Surgeon: Lavena Bullion, DO;  Location: [facility];  Service: Gastroenterology;;   ESOPHAGOGASTRODUODENOSCOPY (EGD) WITH PROPOFOL N/A 03/25/2021   Procedure: ESOPHAGOGASTRODUODENOSCOPY (EGD) WITH PROPOFOL;  Surgeon: Lavena Bullion, DO;  Location: [facility];  Service: Gastroenterology;  Laterality: N/A;  dialation of stricture   ESOPHAGOGASTRODUODENOSCOPY (EGD) WITH PROPOFOL N/A 06/11/2021   Procedure: ESOPHAGOGASTRODUODENOSCOPY (EGD) WITH PROPOFOL;  Surgeon: Lavena Bullion, DO;  Location: [facility];  Service: Gastroenterology;  Laterality: N/A;   FINE NEEDLE ASPIRATION  03/11/2020   Procedure: FINE NEEDLE ASPIRATION (FNA) LINEAR;  Surgeon: Garner Nash, DO;  Location: [facility];  Service: Pulmonary;;   IR IMAGING GUIDED PORT INSERTION  10/16/2020   KNEE ARTHROSCOPY  WITH ANTERIOR CRUCIATE LIGAMENT (ACL) REPAIR     x 2   VIDEO BRONCHOSCOPY WITH ENDOBRONCHIAL ULTRASOUND N/A 03/11/2020   Procedure: VIDEO BRONCHOSCOPY WITH ENDOBRONCHIAL ULTRASOUND;  Surgeon: Garner Nash, DO;  Location: [facility];  Service: Pulmonary;  Laterality: N/A;     Social History:   reports that he quit smoking about 15 months ago. His smoking use included cigarettes. He has a 86.00 pack-year smoking history. He has never used smokeless tobacco. He reports that he does not currently use alcohol. He reports that he does not currently use drugs.   Family History:  His family history includes High blood pressure in his mother; Stroke in his mother. There is no history of Colon cancer, Esophageal cancer, Stomach cancer, or Pancreatic cancer.   Sherrilyn Rist, MD [facility] Pager: See Shea Evans

## 2021-08-02 ENCOUNTER — Inpatient Hospital Stay (HOSPITAL_COMMUNITY): Payer: BC Managed Care – PPO

## 2021-08-02 DIAGNOSIS — E440 Moderate protein-calorie malnutrition: Secondary | ICD-10-CM | POA: Diagnosis not present

## 2021-08-02 DIAGNOSIS — I4891 Unspecified atrial fibrillation: Secondary | ICD-10-CM | POA: Diagnosis not present

## 2021-08-02 DIAGNOSIS — C3491 Malignant neoplasm of unspecified part of right bronchus or lung: Secondary | ICD-10-CM | POA: Diagnosis not present

## 2021-08-02 LAB — EXPECTORATED SPUTUM ASSESSMENT W GRAM STAIN, RFLX TO RESP C

## 2021-08-02 LAB — CBC
HCT: 22.9 % — ABNORMAL LOW (ref 39.0–52.0)
Hemoglobin: 7 g/dL — ABNORMAL LOW (ref 13.0–17.0)
MCH: 28.5 pg (ref 26.0–34.0)
MCHC: 30.1 g/dL (ref 30.0–36.0)
MCV: 94.6 fL (ref 80.0–100.0)
Platelets: 287 10*3/uL (ref 150–400)
RBC: 2.42 MIL/uL — ABNORMAL LOW (ref 4.22–5.81)
RDW: 20.3 % — ABNORMAL HIGH (ref 11.5–15.5)
WBC: 17.6 10*3/uL — ABNORMAL HIGH (ref 4.0–10.5)
nRBC: 0.7 % — ABNORMAL HIGH (ref 0.0–0.2)

## 2021-08-02 LAB — BASIC METABOLIC PANEL
Anion gap: 6 (ref 5–15)
BUN: 23 mg/dL — ABNORMAL HIGH (ref 6–20)
CO2: 19 mmol/L — ABNORMAL LOW (ref 22–32)
Calcium: 7.7 mg/dL — ABNORMAL LOW (ref 8.9–10.3)
Chloride: 116 mmol/L — ABNORMAL HIGH (ref 98–111)
Creatinine, Ser: 0.66 mg/dL (ref 0.61–1.24)
GFR, Estimated: >60 mL/min (ref >60)
Glucose, Bld: 132 mg/dL — ABNORMAL HIGH (ref 70–99)
Potassium: 3.4 mmol/L — ABNORMAL LOW (ref 3.5–5.1)
Sodium: 141 mmol/L (ref 135–145)

## 2021-08-02 LAB — URINE CULTURE: Culture: <10000 — AB

## 2021-08-02 LAB — TRIGLYCERIDES, BODY FLUIDS: Triglycerides, Fluid: 157 mg/dL

## 2021-08-02 LAB — GLUCOSE, CAPILLARY
Glucose-Capillary: 154 mg/dL — ABNORMAL HIGH (ref 70–99)
Glucose-Capillary: 252 mg/dL — ABNORMAL HIGH (ref 70–99)
Glucose-Capillary: 153 mg/dL — ABNORMAL HIGH (ref 70–99)
Glucose-Capillary: 163 mg/dL — ABNORMAL HIGH (ref 70–99)

## 2021-08-02 LAB — PREPARE RBC (CROSSMATCH)

## 2021-08-02 LAB — PROTEIN, BODY FLUID (OTHER): Total Protein, Body Fluid Other: 4 g/dL

## 2021-08-02 LAB — MAGNESIUM: Magnesium: 1.6 mg/dL — ABNORMAL LOW (ref 1.7–2.4)

## 2021-08-02 MED ORDER — HYDROCOD POLST-CPM POLST ER 10-8 MG/5ML PO SUER
5.0000 mL | Freq: Two times a day (BID) | ORAL | Status: DC
  Administered 2021-08-02 – 2021-08-18 (×31): 5 mL via ORAL
  Filled 2021-08-02 (×31): qty 5

## 2021-08-02 MED ORDER — POTASSIUM CHLORIDE CRYS ER 20 MEQ PO TBCR
40.0000 meq | EXTENDED_RELEASE_TABLET | Freq: Once | ORAL | Status: AC
  Administered 2021-08-02: 40 meq via ORAL
  Filled 2021-08-02: qty 2

## 2021-08-02 MED ORDER — METHOCARBAMOL 500 MG PO TABS
500.0000 mg | ORAL_TABLET | Freq: Four times a day (QID) | ORAL | Status: DC | PRN
  Administered 2021-08-02 – 2021-08-03 (×2): 500 mg via ORAL
  Filled 2021-08-02 (×2): qty 1

## 2021-08-02 MED ORDER — SODIUM CHLORIDE 0.9% IV SOLUTION: Freq: Once | INTRAVENOUS | Status: AC

## 2021-08-02 MED ORDER — SODIUM CHLORIDE 0.9% FLUSH
10.0000 mL | Freq: Two times a day (BID) | INTRAVENOUS | Status: DC
  Administered 2021-08-02 – 2021-08-03 (×4): 10 mL
  Administered 2021-08-04: 30 mL
  Administered 2021-08-04 – 2021-08-07 (×6): 10 mL
  Administered 2021-08-07: 40 mL
  Administered 2021-08-08 – 2021-08-18 (×17): 10 mL

## 2021-08-02 MED ORDER — IOHEXOL 350 MG/ML SOLN
75.0000 mL | Freq: Once | INTRAVENOUS | Status: AC | PRN
  Administered 2021-08-02: 75 mL via INTRAVENOUS

## 2021-08-02 MED ORDER — SODIUM CHLORIDE 0.9 % IV SOLN
2.0000 g | INTRAVENOUS | Status: DC
  Administered 2021-08-02 – 2021-08-04 (×3): 2 g via INTRAVENOUS
  Filled 2021-08-02 (×6): qty 20

## 2021-08-02 MED ORDER — VANCOMYCIN HCL 1250 MG/250ML IV SOLN
1250.0000 mg | Freq: Two times a day (BID) | INTRAVENOUS | Status: DC
  Filled 2021-08-02: qty 250

## 2021-08-02 MED ORDER — SODIUM CHLORIDE 0.9% FLUSH
10.0000 mL | INTRAVENOUS | Status: DC | PRN
  Administered 2021-08-05 – 2021-08-15 (×5): 10 mL

## 2021-08-02 MED ORDER — ORAL CARE MOUTH RINSE
15.0000 mL | Freq: Two times a day (BID) | OROMUCOSAL | Status: DC
  Administered 2021-08-03 – 2021-08-18 (×26): 15 mL via OROMUCOSAL

## 2021-08-02 MED ORDER — MAGNESIUM SULFATE 2 GM/50ML IV SOLN
2.0000 g | Freq: Once | INTRAVENOUS | Status: AC
  Administered 2021-08-02: 2 g via INTRAVENOUS
  Filled 2021-08-02: qty 50

## 2021-08-02 NOTE — Progress Notes (Addendum)
PROGRESS NOTE    William Farrell  XKG:[id number] DOB: [birth year] DOA: 07/30/2021 PCP: [name], DO   Chief Complain: Fatigue, poor oral intake  Brief Narrative: Patient is a 60 year old male with history of stage IIIa non-small cell lung cancer currently on chemotherapy, hyperlipidemia, diabetes type 2 who presented here with complaints of fatigue, poor oral intake for 2 weeks before admission.  On presentation he was found to be in A. fib with RVR, DKA and also found to have loculated right-sided pleural effusion.  PCCM was consulted on presentation, status post right chest tube placement.  Currently on broad-spectrum antibiotics for loculated right-sided pleural effusion.  Oncology following.  A. fib with RVR has resolved and currently in normal sinus rhythm.  Assessment & Plan:   Active Problems:   Atrial fibrillation with rapid ventricular response (HCC)   Malnutrition of moderate degree   Sepsis secondary to loculated right-sided pleural effusion: Presented with shortness of breath.  Status post chest tube placement on 9/15.  Procalcitonin was elevated.  Still has significant leukocytosis, afebrile.  Currently on broad-spectrum antibiotics with vancomycin, cefepime, Flagyl.  Pleural fluid culture showing streptococcus intermedius, will follow final culture report.  Blood cultures have remained negative so far.  This morning he was on room air.  Still having significant productive cough.  We will get a sputum culture  Stage III non-small cell lung cancer: Follows with Dr. Julien Nordmann, currently on chemo.  Planning for restaging imagings as per oncology.  Oncology following.  A. fib with RVR: Likely transient, now resolved after starting on Cardizem drip.  Currently in normal sinus rhythm.  TSH normal.  Echocardiogram showed EF of 60 to 65%, normal left ventricular diastolic parameters.  Not a started on anticoagulation because of transient nature of the A. fib with RVR/likely  related to pulmonary issues.  DKA/diabetes type 2: Initially started on insulin drip now transition to subcutaneous insulin.  Takes metformin, glipizide, Ozempic at home.  Monitor blood sugars.  Continue current insulin regimen.  Hemoglobin A1c of 6.6.  AKI on CKD stage IIIa: Creatinine was in the range of 2 on presentation.  Baseline creatinine around 1.3.  Currently kidney function at baseline.  Chronic normocytic anemia: Baseline hemoglobin around 8.  Hemoglobin dropped to the range of 7.  No evidence of acute blood loss.  Most likely associated with underlying malignancy, chemotherapy.  Iron studies showed low iron,given IV iron infusion.  Transfused with a unit of PRBC on 08/02/2021  Hyperlipidemia: On statin  Hypothyroidism: On Synthyroid  GERD: Continue PPI  Hypokalemia/hypomagnesemia: Being supplemented and monitored  Moderate malnutrition: Secondary to malignancy.  Nutrition following  Debility/deconditioning: PT/OT evaluated the patient and recommend home health on discharge.  Nutrition Problem: Moderate Malnutrition Etiology: chronic illness, cancer and cancer related treatments      DVT prophylaxis:Lovenox Code Status: Full Family Communication: Wife present at bedside Status is: Inpatient  Remains inpatient appropriate because:Inpatient level of care appropriate due to severity of illness  Dispo: The patient is from: Home              Anticipated d/c is to: Home              Patient currently is not medically stable to d/c.   Difficult to place patient No    Consultants: Oncology,PCCM  Procedures:Chest tube placement  Antimicrobials:  Anti-infectives (From admission, onward)    Start     Dose/Rate Route Frequency Ordered Stop   08/02/21 1100  vancomycin (VANCOREADY) IVPB 1250 mg/250 mL  1,250 mg 166.7 mL/hr over 90 Minutes Intravenous Every 12 hours 08/02/21 0726     08/01/21 1100  vancomycin (VANCOCIN) IVPB 1000 mg/200 mL premix  Status:   Discontinued        1,000 mg 200 mL/hr over 60 Minutes Intravenous Every 12 hours 08/01/21 0951 08/02/21 0726   07/31/21 1800  vancomycin (VANCOREADY) IVPB 1250 mg/250 mL  Status:  Discontinued        1,250 mg 166.7 mL/hr over 90 Minutes Intravenous Every 24 hours 07/30/21 1911 07/31/21 0845   07/31/21 1800  vancomycin (VANCOREADY) IVPB 1500 mg/300 mL  Status:  Discontinued        1,500 mg 150 mL/hr over 120 Minutes Intravenous Every 24 hours 07/31/21 0845 08/01/21 0951   07/31/21 0945  ceFEPIme (MAXIPIME) 2 g in sodium chloride 0.9 % 100 mL IVPB        2 g 200 mL/hr over 30 Minutes Intravenous Every 8 hours 07/31/21 0845     07/30/21 2000  metroNIDAZOLE (FLAGYL) IVPB 500 mg        500 mg 100 mL/hr over 60 Minutes Intravenous Every 12 hours 07/30/21 1738     07/30/21 1830  vancomycin (VANCOREADY) IVPB 1750 mg/350 mL        1,750 mg 175 mL/hr over 120 Minutes Intravenous  Once 07/30/21 1738 07/30/21 1846   07/30/21 1800  ceFEPIme (MAXIPIME) 2 g in sodium chloride 0.9 % 100 mL IVPB  Status:  Discontinued        2 g 200 mL/hr over 30 Minutes Intravenous Every 12 hours 07/30/21 1723 07/31/21 0845       Subjective: Patient seen and examined the bedside this morning.  He was on room air.  He was coughing a lot and bringing of sputum.  He complains of pain in the chest on the back from persistent coughing.  Looks more comfortable than yesterday.  Objective: Vitals:   08/02/21 0300 08/02/21 0400 08/02/21 0500 08/02/21 0600  BP: 123/68 113/64 121/90 117/76  Pulse: (!) 110 100 (!) 104 99  Resp: 16 17 18 20   Temp: 98.7 F (37.1 C)     TempSrc:      SpO2: 92% 95% 95% 95%  Weight:      Height:        Intake/Output Summary (Last 24 hours) at 08/02/2021 0732 Last data filed at 08/02/2021 0600 Gross per 24 hour  Intake 1864.7 ml  Output 1370 ml  Net 494.7 ml   Filed Weights   07/30/21 1101 07/30/21 1133  Weight: 95.4 kg 88.9 kg    Examination:  General exam: Deconditioned,  chronically looking HEENT: PERRL Respiratory system: Mildly diminished air entry on the  right side, no wheezes or crackles .  Right-sided chest tube Cardiovascular system: S1 & S2 heard, RRR.  Gastrointestinal system: Abdomen is nondistended, soft and nontender. Central nervous system: Alert and oriented Extremities: No edema, no clubbing ,no cyanosis Skin: No rashes, no ulcers,no icterus     Data Reviewed: I have personally reviewed following labs and imaging studies  CBC: Recent Labs  Lab 07/30/21 0927 07/30/21 2219 07/31/21 0232 07/31/21 0900 08/01/21 0445 08/02/21 0522  WBC 44.5* 35.1* 30.7*  --  19.4* 17.6*  NEUTROABS 41.6*  --   --   --   --   --   HGB 9.2* 8.3* 7.5* 7.0* 7.3* 7.0*  HCT 28.3* 25.9* 23.4* 22.5* 24.6* 22.9*  MCV 87.6 89.3 88.6  --  93.9 94.6  PLT 530* 217 380  --  343 833   Basic Metabolic Panel: Recent Labs  Lab 07/30/21 2115 07/31/21 0232 07/31/21 0800 08/01/21 0445 08/02/21 0522  NA 136 137 138 143 141  K 4.8 4.5 4.3 4.4 3.4*  CL 104 107 106 113* 116*  CO2 19* 22 21* 20* 19*  GLUCOSE 143* 169* 173* 187* 132*  BUN 69* 65* 62* 41* 23*  CREATININE 1.48*  1.50* 1.35* 1.23 1.06 0.66  CALCIUM 9.3 9.4 9.4 9.5 7.7*  MG  --   --   --  2.1 1.6*   GFR: Estimated Creatinine Clearance: 107.8 mL/min (by C-G formula based on SCr of 0.66 mg/dL). Liver Function Tests: Recent Labs  Lab 07/30/21 0927 07/30/21 1822 07/31/21 0232  AST 20  --  14*  ALT 16  --  14  ALKPHOS 216*  --  139*  BILITOT 0.4  --  0.4  PROT 8.0 7.4 7.0  ALBUMIN 1.9*  --  2.1*   No results for input(s): LIPASE, AMYLASE in the last 168 hours. No results for input(s): AMMONIA in the last 168 hours. Coagulation Profile: Recent Labs  Lab 07/31/21 0232  INR 1.7*   Cardiac Enzymes: No results for input(s): CKTOTAL, CKMB, CKMBINDEX, TROPONINI in the last 168 hours. BNP (last 3 results) No results for input(s): PROBNP in the last 8760 hours. HbA1C: Recent Labs     07/31/21 0800  HGBA1C 6.6*   CBG: Recent Labs  Lab 07/31/21 2137 08/01/21 0725 08/01/21 1140 08/01/21 1631 08/01/21 2208  GLUCAP 244* 193* 248* 193* 139*   Lipid Profile: No results for input(s): CHOL, HDL, LDLCALC, TRIG, CHOLHDL, LDLDIRECT in the last 72 hours. Thyroid Function Tests: Recent Labs    07/31/21 0800  TSH 2.355   Anemia Panel: Recent Labs    07/31/21 0800  FERRITIN 1,352*  TIBC 174*  IRON 25*   Sepsis Labs: Recent Labs  Lab 07/30/21 2115 07/30/21 2235 07/31/21 0232  PROCALCITON  --   --  3.21  LATICACIDVEN 1.2 2.1*  --     Recent Results (from the past 240 hour(s))  Resp Panel by RT-PCR (Flu A&B, Covid) Nasopharyngeal Swab     Status: None   Collection Time: 07/30/21 11:19 AM   Specimen: Nasopharyngeal Swab; Nasopharyngeal(NP) swabs in vial transport medium  Result Value Ref Range Status   SARS Coronavirus 2 by RT PCR NEGATIVE NEGATIVE Final    Comment: (NOTE) SARS-CoV-2 target nucleic acids are NOT DETECTED.  The SARS-CoV-2 RNA is generally detectable in upper respiratory specimens during the acute phase of infection. The lowest concentration of SARS-CoV-2 viral copies this assay can detect is 138 copies/mL. A negative result does not preclude SARS-Cov-2 infection and should not be used as the sole basis for treatment or other patient management decisions. A negative result may occur with  improper specimen collection/handling, submission of specimen other than nasopharyngeal swab, presence of viral mutation(s) within the areas targeted by this assay, and inadequate number of viral copies(<138 copies/mL). A negative result must be combined with clinical observations, patient history, and epidemiological information. The expected result is Negative.  Fact Sheet for Patients:  EntrepreneurPulse.com.au  Fact Sheet for Healthcare Providers:  IncredibleEmployment.be  This test is no t yet approved or  cleared by the [place] FDA and  has been authorized for detection and/or diagnosis of SARS-CoV-2 by FDA under an Emergency Use Authorization (EUA). This EUA will remain  in effect (meaning this test can be used) for the duration of the COVID-19 declaration under Section 564(b)(1)  of the Act, 21 U.S.C.section 360bbb-3(b)(1), unless the authorization is terminated  or revoked sooner.       Influenza A by PCR NEGATIVE NEGATIVE Final   Influenza B by PCR NEGATIVE NEGATIVE Final    Comment: (NOTE) The Xpert Xpress SARS-CoV-2/FLU/RSV plus assay is intended as an aid in the diagnosis of influenza from Nasopharyngeal swab specimens and should not be used as a sole basis for treatment. Nasal washings and aspirates are unacceptable for Xpert Xpress SARS-CoV-2/FLU/RSV testing.  Fact Sheet for Patients: EntrepreneurPulse.com.au  Fact Sheet for Healthcare Providers: IncredibleEmployment.be  This test is not yet approved or cleared by the [place] FDA and has been authorized for detection and/or diagnosis of SARS-CoV-2 by FDA under an Emergency Use Authorization (EUA). This EUA will remain in effect (meaning this test can be used) for the duration of the COVID-19 declaration under Section 564(b)(1) of the Act, 21 U.S.C. section 360bbb-3(b)(1), unless the authorization is terminated or revoked.  Performed at [facility], [place] [address] [place], [place] [address]   Body fluid culture w Gram Stain     Status: None (Preliminary result)   Collection Time: 07/30/21  5:35 PM   Specimen: Pleura; Body Fluid  Result Value Ref Range Status   Specimen Description   Final    PLEURAL RIGHT Performed at [place] [address], [place], [place] [address]    Special Requests   Final    NONE Performed at [facility], [place] [address], [place], [place] [address]    Gram Stain   Final     ABUNDANT WBC PRESENT,BOTH PMN AND MONONUCLEAR FEW GRAM POSITIVE COCCI    Culture   Final    FEW STREPTOCOCCUS INTERMEDIUS SUSCEPTIBILITIES TO FOLLOW Performed at [facility], [place] [address], [place], [place] [address]    Report Status PENDING  Incomplete  Culture, blood (routine x 2)     Status: None (Preliminary result)   Collection Time: 07/30/21 10:35 PM   Specimen: Right Antecubital; Blood  Result Value Ref Range Status   Specimen Description   Final    RIGHT ANTECUBITAL Performed at [place] [address], [place], [place] [address]    Special Requests   Final    BOTTLES DRAWN AEROBIC AND ANAEROBIC Blood Culture adequate volume Performed at [place] [address], [place], [place] [address]    Culture   Final    NO GROWTH 1 DAY Performed at [facility], [place] [address] [place], [place] [address]    Report Status PENDING  Incomplete  Culture, blood (routine x 2)     Status: None (Preliminary result)   Collection Time: 07/30/21 10:35 PM   Specimen: Left Antecubital; Blood  Result Value Ref Range Status   Specimen Description   Final    LEFT ANTECUBITAL Performed at [place] [address] [place], [place] [address]    Special Requests   Final    BOTTLES DRAWN AEROBIC AND ANAEROBIC Blood Culture adequate volume Performed at [place] [address] [place], [place] [address]    Culture   Final    NO GROWTH 1 DAY Performed at [facility], [place] [address], [place], [place] [address]    Report Status PENDING  Incomplete  MRSA Next Gen by PCR, Nasal     Status: None   Collection Time: 07/30/21 11:07 PM   Specimen: Nasal  Mucosa; Nasal Swab  Result Value Ref Range Status   MRSA by PCR Next Gen NOT DETECTED NOT DETECTED Final    Comment: (NOTE) The GeneXpert MRSA Assay (FDA approved for NASAL specimens only), is one component of a comprehensive MRSA  colonization surveillance program. It is not intended to diagnose MRSA infection nor to guide or monitor treatment for MRSA infections. Test performance is not FDA approved in patients less than 73 years old. Performed at [facility], [place] [address] [place], [place] [address]          Radiology Studies: ECHOCARDIOGRAM COMPLETE  Result Date: 07/31/2021    ECHOCARDIOGRAM REPORT   Patient Name:   JONTY MORRICAL Date of Exam: 07/31/2021 Medical Rec #:  [id number]          Height:       72.0 in Accession #:    [id number]         Weight:       196.0 lb Date of Birth:  June 30, 1961         BSA:          2.112 m Patient Age:    13 years           BP:           125/65 mmHg Patient Gender: M                  HR:           85 bpm. Exam Location:  Inpatient Procedure: 2D Echo, Cardiac Doppler and Color Doppler Indications:    I42.9 Cardiomyopathy (unspecified)  History:        Patient has no prior history of Echocardiogram examinations.                 Arrythmias:Atrial Fibrillation; Risk Factors:Diabetes.  Sonographer:    MH Referring Phys: [id number] ANKIT CHIRAG AMIN IMPRESSIONS  1. Left ventricular ejection fraction, by estimation, is 60 to 65%. The left ventricle has normal function. The left ventricle has no regional wall motion abnormalities. There is mild concentric left ventricular hypertrophy. Left ventricular diastolic parameters were normal.  2. Right ventricular systolic function is normal. The right ventricular size is normal. Tricuspid regurgitation signal is inadequate for assessing PA pressure.  3. The mitral valve is normal in structure. No evidence of mitral valve regurgitation. No evidence of mitral stenosis.  4. The aortic valve is tricuspid. Aortic valve regurgitation is not visualized. No aortic stenosis is present.  5. The inferior vena cava is dilated in size with >50% respiratory variability, suggesting right atrial pressure of 8 mmHg. Comparison(s): No prior  Echocardiogram. FINDINGS  Left Ventricle: Left ventricular ejection fraction, by estimation, is 60 to 65%. The left ventricle has normal function. The left ventricle has no regional wall motion abnormalities. The left ventricular internal cavity size was small. There is mild concentric left ventricular hypertrophy. Left ventricular diastolic parameters were normal. Right Ventricle: The right ventricular size is normal. No increase in right ventricular wall thickness. Right ventricular systolic function is normal. Tricuspid regurgitation signal is inadequate for assessing PA pressure. Left Atrium: Left atrial size was normal in size. Right Atrium: Right atrial size was normal in size. Pericardium: There is no evidence of pericardial effusion. Mitral Valve: The mitral valve is normal in structure. No evidence of mitral valve regurgitation. No evidence of mitral valve stenosis. Tricuspid Valve: The tricuspid valve is normal in structure. Tricuspid valve regurgitation is not demonstrated. No evidence of tricuspid  stenosis. Aortic Valve: The aortic valve is tricuspid. Aortic valve regurgitation is not visualized. No aortic stenosis is present. Aortic valve mean gradient measures 4.0 mmHg. Aortic valve peak gradient measures 6.2 mmHg. Aortic valve area, by VTI measures 3.44 cm. Pulmonic Valve: The pulmonic valve was not well visualized. Pulmonic valve regurgitation is not visualized. No evidence of pulmonic stenosis. Aorta: The aortic root and ascending aorta are structurally normal, with no evidence of dilitation. Venous: The inferior vena cava is dilated in size with greater than 50% respiratory variability, suggesting right atrial pressure of 8 mmHg. IAS/Shunts: The atrial septum is grossly normal.  LEFT VENTRICLE PLAX 2D LVIDd:         3.70 cm     Diastology LVIDs:         2.40 cm     LV e' medial:    9.14 cm/s LV PW:         1.40 cm     LV E/e' medial:  9.0 LV IVS:        1.50 cm     LV e' lateral:   12.80 cm/s LVOT  diam:     2.40 cm     LV E/e' lateral: 6.5 LV SV:         88 LV SV Index:   42 LVOT Area:     4.52 cm  LV Volumes (MOD) LV vol d, MOD A4C: 46.1 ml LV vol s, MOD A4C: 16.7 ml LV SV MOD A4C:     46.1 ml RIGHT VENTRICLE             IVC RV S prime:     13.10 cm/s  IVC diam: 2.20 cm TAPSE (M-mode): 2.1 cm LEFT ATRIUM           Index LA diam:      2.70 cm 1.28 cm/m LA Vol (A2C): 13.4 ml 6.34 ml/m LA Vol (A4C): 21.7 ml 10.27 ml/m  AORTIC VALVE                   PULMONIC VALVE AV Area (Vmax):    3.53 cm    PV Vmax:       0.73 m/s AV Area (Vmean):   3.27 cm    PV Peak grad:  2.1 mmHg AV Area (VTI):     3.44 cm AV Vmax:           125.00 cm/s AV Vmean:          99.600 cm/s AV VTI:            0.255 m AV Peak Grad:      6.2 mmHg AV Mean Grad:      4.0 mmHg LVOT Vmax:         97.50 cm/s LVOT Vmean:        71.900 cm/s LVOT VTI:          0.194 m LVOT/AV VTI ratio: 0.76  AORTA Ao Root diam: 3.80 cm Ao Asc diam:  3.30 cm MITRAL VALVE MV Area (PHT): 4.49 cm    SHUNTS MV E velocity: 82.60 cm/s  Systemic VTI:  0.19 m MV A velocity: 62.40 cm/s  Systemic Diam: 2.40 cm MV E/A ratio:  1.32 Rudean Haskell MD Electronically signed by Rudean Haskell MD Signature Date/Time: 07/31/2021/3:23:52 PM    Final         Scheduled Meds:  sodium chloride   Intravenous Once   aspirin EC  81 mg Oral QHS   atorvastatin  40 mg Oral QHS   Chlorhexidine Gluconate Cloth  6 each Topical Daily   enoxaparin (LOVENOX) injection  40 mg Subcutaneous Q24H   feeding supplement  1 Container Oral TID BM   ferrous sulfate  325 mg Oral Q breakfast   guaiFENesin  600 mg Oral BID   insulin aspart  0-15 Units Subcutaneous TID WC   insulin aspart  0-5 Units Subcutaneous QHS   insulin glargine-yfgn  20 Units Subcutaneous Daily   levothyroxine  75 mcg Oral Q0600   lidocaine  1 patch Transdermal Daily   multivitamin with minerals  1 tablet Oral Daily   pantoprazole  40 mg Oral Daily   potassium chloride  40 mEq Oral Once   revefenacin   175 mcg Nebulization Daily   senna-docusate  2 tablet Oral QHS   Continuous Infusions:  ceFEPime (MAXIPIME) IV Stopped (08/02/21 0235)   ferric gluconate (FERRLECIT) IVPB Stopped (08/01/21 1429)   magnesium sulfate bolus IVPB     metronidazole Stopped (08/01/21 2132)   vancomycin       LOS: 3 days    Time spent:35 mins, More than 50% of that time was spent in counseling and/or coordination of care.      Shelly Coss, MD [facility] P9/18/2022, 7:32 AM

## 2021-08-02 NOTE — Progress Notes (Addendum)
NAME:  William Farrell, MRN:  [id number], DOB:  1961-10-07, LOS: 3 ADMISSION DATE:  07/30/2021, CONSULTATION DATE:  9/15 REFERRING MD:  Dr Marylyn Ishihara, CHIEF COMPLAINT:  SOB/weakness   History of Present Illness:  60 year old male with prior history of Stage IIIa NSCLC (SCC) RUL diagnosed 02/2020 (RUL/suprahilar mass and R paratracheal and subcarinal LAD) s/p chemo XRT including keytruda, consolidation IT with durvalumab, SBRT to RUL nodules followed by Dr. Julien Nordmann. He is receiving neulasta support.   Presented to Dr. Julien Nordmann for follow-up, reportedly not felt well for 1-2 weeks, 15 lb weight loss since his prior visit, pain under right breast, worse with cough and progressive dyspnea on exertion even on his home O2.  He has had productive cough, orthopnea that has worsened over the last week. He has borrowed his wife's oxygen. A little hemoptysis, but no nausea, vomiting, or diarrhea.  No fever/chills.  He does acknowledge some trouble with aspiration of solids/liquids. Was due to start cycle #12 today.  Pertinent  Medical History  NSCLC stage IIIA (dx 02/2020)  Significant Hospital Events: Including procedures, antibiotic start and stop dates in addition to other pertinent events   Chest tube placement 07/30/2021  Interim History / Subjective:  No overnight events Significant effluent from chest tube Cough and chest discomfort  Objective   Blood pressure 133/88, pulse (!) 101, temperature 97.9 F (36.6 C), temperature source Oral, resp. rate 19, height 6' (1.829 m), weight 88.9 kg, SpO2 100 %.        Intake/Output Summary (Last 24 hours) at 08/02/2021 0841 Last data filed at 08/02/2021 0600 Gross per 24 hour  Intake 1864.7 ml  Output 1370 ml  Net 494.7 ml   Filed Weights   07/30/21 1101 07/30/21 1133  Weight: 95.4 kg 88.9 kg    Examination: General: Elderly, uncomfortable HENT: Moist oral mucosa Lungs: Decreased air entry bilaterally Cardiovascular: S1-S2 appreciated Abdomen:  Bowel sounds appreciated Extremities: Trace edema Neuro: Alert and oriented x3, no focality GU: Fair output  Resolved Hospital Problem list     Assessment & Plan:  .  Right pleural effusion -Chest tube in place -200 cc over the last 24 hours -Exudative effusion, no organism on cultures -Few strep intermedius  .  Atelectasis -History of non-small cell lung cancer  Non-small cell lung cancer -Oncology following -Order CT scan of the chest with contrast for a.m.  Marland Kitchen  Empyema -Decreasing output -Continue to monitor -Discontinue vancomycin -MRSA PCR negative, strep intermedius and fluid -Switch to Rocephin  .  A. fib with RVR -Off anticoagulation -Only transient  .  DKA .  Type 2 diabetes -SSI  Discussed with spouse at bedside  Tentative plan for CT scan of the chest on Monday  Best Practice (right click and "Reselect all SmartList Selections" daily)   Diet/type: Regular consistency (see orders) DVT prophylaxis:  GI prophylaxis: PPI Lines: N/A Foley:  N/A Code Status:  full code Last date of multidisciplinary goals of care discussion [Per primary]  Labs   CBC: Recent Labs  Lab 07/30/21 0927 07/30/21 2219 07/31/21 0232 07/31/21 0900 08/01/21 0445 08/02/21 0522  WBC 44.5* 35.1* 30.7*  --  19.4* 17.6*  NEUTROABS 41.6*  --   --   --   --   --   HGB 9.2* 8.3* 7.5* 7.0* 7.3* 7.0*  HCT 28.3* 25.9* 23.4* 22.5* 24.6* 22.9*  MCV 87.6 89.3 88.6  --  93.9 94.6  PLT 530* 217 380  --  343 287  Basic Metabolic Panel: Recent Labs  Lab 07/30/21 2115 07/31/21 0232 07/31/21 0800 08/01/21 0445 08/02/21 0522  NA 136 137 138 143 141  K 4.8 4.5 4.3 4.4 3.4*  CL 104 107 106 113* 116*  CO2 19* 22 21* 20* 19*  GLUCOSE 143* 169* 173* 187* 132*  BUN 69* 65* 62* 41* 23*  CREATININE 1.48*  1.50* 1.35* 1.23 1.06 0.66  CALCIUM 9.3 9.4 9.4 9.5 7.7*  MG  --   --   --  2.1 1.6*   GFR: Estimated Creatinine Clearance: 107.8 mL/min (by C-G formula based on SCr of 0.66  mg/dL). Recent Labs  Lab 07/30/21 2115 07/30/21 2219 07/30/21 2235 07/31/21 0232 08/01/21 0445 08/02/21 0522  PROCALCITON  --   --   --  3.21  --   --   WBC  --  35.1*  --  30.7* 19.4* 17.6*  LATICACIDVEN 1.2  --  2.1*  --   --   --     Liver Function Tests: Recent Labs  Lab 07/30/21 0927 07/30/21 1822 07/31/21 0232  AST 20  --  14*  ALT 16  --  14  ALKPHOS 216*  --  139*  BILITOT 0.4  --  0.4  PROT 8.0 7.4 7.0  ALBUMIN 1.9*  --  2.1*   No results for input(s): LIPASE, AMYLASE in the last 168 hours. No results for input(s): AMMONIA in the last 168 hours.  ABG    Component Value Date/Time   HCO3 18.6 (L) 07/30/2021 2235   ACIDBASEDEF 5.5 (H) 07/30/2021 2235   O2SAT 86.8 07/30/2021 2235     Coagulation Profile: Recent Labs  Lab 07/31/21 0232  INR 1.7*    Cardiac Enzymes: No results for input(s): CKTOTAL, CKMB, CKMBINDEX, TROPONINI in the last 168 hours.  HbA1C: Hemoglobin A1C  Date/Time Value Ref Range Status  06/10/2021 08:32 AM 6.8 (A) 4.0 - 5.6 % Final  03/11/2021 08:37 AM 7.5 (A) 4.0 - 5.6 % Final   Hgb A1c MFr Bld  Date/Time Value Ref Range Status  07/31/2021 08:00 AM 6.6 (H) 4.8 - 5.6 % Final    Comment:    (NOTE) Pre diabetes:          5.7%-6.4%  Diabetes:              >6.4%  Glycemic control for   <7.0% adults with diabetes   01/14/2020 08:59 AM 7.1 (H) <5.7 % of total Hgb Final    Comment:    For someone without known diabetes, a hemoglobin A1c value of 6.5% or greater indicates that they may have  diabetes and this should be confirmed with a follow-up  test. . For someone with known diabetes, a value <7% indicates  that their diabetes is well controlled and a value  greater than or equal to 7% indicates suboptimal  control. A1c targets should be individualized based on  duration of diabetes, age, comorbid conditions, and  other considerations. . Currently, no consensus exists regarding use of hemoglobin A1c for diagnosis of  diabetes for children. .     CBG: Recent Labs  Lab 08/01/21 0725 08/01/21 1140 08/01/21 1631 08/01/21 2208 08/02/21 0806  GLUCAP 193* 248* 193* 139* 154*    Review of Systems:   Pain and discomfort adequately controlled  Past Medical History:  He,  has a past medical history of Asthma, Diabetes ([place]), Diverticulitis, GERD (gastroesophageal reflux disease), High cholesterol, History of kidney stones, Lung cancer ([place]), and Wears glasses.  Surgical History:   Past Surgical History:  Procedure Laterality Date   BIOPSY  03/25/2021   Procedure: BIOPSY;  Surgeon: Lavena Bullion, DO;  Location: [facility];  Service: Gastroenterology;;   BIOPSY  06/11/2021   Procedure: BIOPSY;  Surgeon: Lavena Bullion, DO;  Location: [facility];  Service: Gastroenterology;;   broken bone repair     BRONCHIAL BIOPSY  03/11/2020   Procedure: BRONCHIAL BIOPSIES;  Surgeon: Garner Nash, DO;  Location: [facility];  Service: Pulmonary;;   COLONOSCOPY     around 79. [place]    CRYOTHERAPY  03/11/2020   Procedure: CRYOTHERAPY;  Surgeon: Garner Nash, DO;  Location: [facility];  Service: Pulmonary;;   ESOPHAGEAL DILATION  03/25/2021   Procedure: ESOPHAGEAL DILATION;  Surgeon: Lavena Bullion, DO;  Location: [facility];  Service: Gastroenterology;;   ESOPHAGOGASTRODUODENOSCOPY (EGD) WITH PROPOFOL N/A 03/25/2021   Procedure: ESOPHAGOGASTRODUODENOSCOPY (EGD) WITH PROPOFOL;  Surgeon: Lavena Bullion, DO;  Location: [facility];  Service: Gastroenterology;  Laterality: N/A;  dialation of stricture   ESOPHAGOGASTRODUODENOSCOPY (EGD) WITH PROPOFOL N/A 06/11/2021   Procedure: ESOPHAGOGASTRODUODENOSCOPY (EGD) WITH PROPOFOL;  Surgeon: Lavena Bullion, DO;  Location: [facility];  Service: Gastroenterology;  Laterality: N/A;   FINE NEEDLE ASPIRATION  03/11/2020   Procedure: FINE NEEDLE ASPIRATION (FNA) LINEAR;  Surgeon: Garner Nash, DO;  Location: [facility];  Service:  Pulmonary;;   IR IMAGING GUIDED PORT INSERTION  10/16/2020   KNEE ARTHROSCOPY WITH ANTERIOR CRUCIATE LIGAMENT (ACL) REPAIR     x 2   VIDEO BRONCHOSCOPY WITH ENDOBRONCHIAL ULTRASOUND N/A 03/11/2020   Procedure: VIDEO BRONCHOSCOPY WITH ENDOBRONCHIAL ULTRASOUND;  Surgeon: Garner Nash, DO;  Location: [facility];  Service: Pulmonary;  Laterality: N/A;     Social History:   reports that he quit smoking about 15 months ago. His smoking use included cigarettes. He has a 86.00 pack-year smoking history. He has never used smokeless tobacco. He reports that he does not currently use alcohol. He reports that he does not currently use drugs.   Family History:  His family history includes High blood pressure in his mother; Stroke in his mother. There is no history of Colon cancer, Esophageal cancer, Stomach cancer, or Pancreatic cancer.   Sherrilyn Rist, MD [facility] Pager: See Shea Evans

## 2021-08-02 NOTE — Progress Notes (Signed)
Pharmacy Antibiotic Note  William Farrell is a 60 y.o. male with lung cancer on XRT and chemotherapy PTA, presented to the ED from [facility] on 07/30/2021 for evaluation of SOB, loss of appetite and increase weakness.  CXR on 9/15 showed lung mass and findings with suspicion for loculated pleural effusion.  He underwent chest tube placement for effusion and thoracentesis on 9/15.  He's currently on vancomycin, cefepime and flagyl for suspected pulmonary infection.  Today, 08/02/2021:. - day #3 abx - afeb, WBC elevated but down (received neulasta on 8/26) - scr trending down with hydration, 0.66 (crcl~81)  Plan: - adjust vancomycin to 1250 mg IV q12h for est AUC 416 - continue cefepime to 2gm q8h - continue flagyl 500mg  IV q12h - monitor renal function closely and adjust dose as needed _______________________________________  Height: 6' (182.9 cm) Weight: 88.9 kg (196 lb) IBW/kg (Calculated) : 77.6  Temp (24hrs), Avg:98.3 F (36.8 C), Min:97.6 F (36.4 C), Max:98.7 F (37.1 C)  Recent Labs  Lab 07/30/21 0927 07/30/21 1822 07/30/21 2115 07/30/21 2219 07/30/21 2235 07/31/21 0232 07/31/21 0800 08/01/21 0445 08/02/21 0522  WBC 44.5*  --   --  35.1*  --  30.7*  --  19.4* 17.6*  CREATININE 2.07*   < > 1.48*  1.50*  --   --  1.35* 1.23 1.06 0.66  LATICACIDVEN  --   --  1.2  --  2.1*  --   --   --   --    < > = values in this interval not displayed.     Estimated Creatinine Clearance: 107.8 mL/min (by C-G formula based on SCr of 0.66 mg/dL).    Allergies  Allergen Reactions   Sitagliptin Other (See Comments)    headache    9/15 Cefepime >>  9/15 Flagyl >>  9/15 Vancomycin >>  9/15 BCx x2:  9/15 right pleural fluid:  few strep intermedius 9/15 MRSA PCR: neg  Thank you for allowing pharmacy to be a part of this patient's care.  Lynelle Doctor 08/02/2021 7:24 AM

## 2021-08-03 DIAGNOSIS — J869 Pyothorax without fistula: Secondary | ICD-10-CM

## 2021-08-03 LAB — CBC
HCT: 28.4 % — ABNORMAL LOW (ref 39.0–52.0)
Hemoglobin: 8.8 g/dL — ABNORMAL LOW (ref 13.0–17.0)
MCH: 28.3 pg (ref 26.0–34.0)
MCHC: 31 g/dL (ref 30.0–36.0)
MCV: 91.3 fL (ref 80.0–100.0)
Platelets: 366 10*3/uL (ref 150–400)
RBC: 3.11 MIL/uL — ABNORMAL LOW (ref 4.22–5.81)
RDW: 20.8 % — ABNORMAL HIGH (ref 11.5–15.5)
WBC: 25.8 10*3/uL — ABNORMAL HIGH (ref 4.0–10.5)
nRBC: 0.3 % — ABNORMAL HIGH (ref 0.0–0.2)

## 2021-08-03 LAB — TYPE AND SCREEN
ABO/RH(D): A NEG
Antibody Screen: NEGATIVE
Unit division: 0

## 2021-08-03 LAB — BASIC METABOLIC PANEL
Anion gap: 8 (ref 5–15)
BUN: 19 mg/dL (ref 6–20)
CO2: 21 mmol/L — ABNORMAL LOW (ref 22–32)
Calcium: 9.2 mg/dL (ref 8.9–10.3)
Chloride: 109 mmol/L (ref 98–111)
Creatinine, Ser: 0.83 mg/dL (ref 0.61–1.24)
GFR, Estimated: >60 mL/min (ref >60)
Glucose, Bld: 152 mg/dL — ABNORMAL HIGH (ref 70–99)
Potassium: 4.3 mmol/L (ref 3.5–5.1)
Sodium: 138 mmol/L (ref 135–145)

## 2021-08-03 LAB — BPAM RBC
Blood Product Expiration Date: 202210112359
ISSUE DATE / TIME: 202209181011
Unit Type and Rh: 600

## 2021-08-03 LAB — BODY FLUID CULTURE W GRAM STAIN

## 2021-08-03 LAB — GLUCOSE, CAPILLARY
Glucose-Capillary: 139 mg/dL — ABNORMAL HIGH (ref 70–99)
Glucose-Capillary: 216 mg/dL — ABNORMAL HIGH (ref 70–99)
Glucose-Capillary: 144 mg/dL — ABNORMAL HIGH (ref 70–99)
Glucose-Capillary: 130 mg/dL — ABNORMAL HIGH (ref 70–99)
Glucose-Capillary: 138 mg/dL — ABNORMAL HIGH (ref 70–99)

## 2021-08-03 LAB — CYTOLOGY - NON PAP

## 2021-08-03 LAB — MAGNESIUM: Magnesium: 1.9 mg/dL (ref 1.7–2.4)

## 2021-08-03 MED ORDER — SODIUM CHLORIDE 0.9% FLUSH
10.0000 mL | Freq: Three times a day (TID) | INTRAVENOUS | Status: DC
  Administered 2021-08-03 – 2021-08-18 (×40): 10 mL

## 2021-08-03 NOTE — Progress Notes (Addendum)
Physical Therapy Treatment Patient Details Name: William Farrell MRN: [id number] DOB: [birth year] Today's Date: 08/03/2021   History of Present Illness Pt is 60 year old with history of stage IIIa non-small cell lung cancer on chemo who presents to the hospital for fatigue and poor oral intake for the past 2 weeks on 07/30/21.  He was found to be in atrial fibrillation with RVR, DKA and had loculated pleural effusion. Chest tube was inserted 07/30/2021. Pt with other hx of HLD and DM2    PT Comments    Nursing pre medicated and prearranged a time frame with  patient to mobilize to recliner. CT remains suction to wall so limited to recliner.  SPO2 on RA>94%. Continue progressive mobility as tolerated.  Recommendations for follow up therapy are one component of a multi-disciplinary discharge planning process, led by the attending physician.  Recommendations may be updated based on patient status, additional functional criteria and insurance authorization.  Follow Up Recommendations  Home health PT;Supervision for mobility/OOB     Equipment Recommendations  Other (comment)    Recommendations for Other Services       Precautions / Restrictions Precautions Precautions: Fall Precaution Comments: R CT to wall suction today     Mobility  Bed Mobility   Bed Mobility: Supine to Sit     Supine to sit: Min guard     General bed mobility comments: Min guard for safety and management of lines.    Transfers Overall transfer level: Needs assistance Equipment used: Rolling walker (2 wheeled) Transfers: Sit to/from [facility] Sit to Stand: Min guard Stand pivot transfers: Min guard       General transfer comment: min guard for standing  Ambulation/Gait Ambulation/Gait assistance: Min assist Gait Distance (Feet): 5 Feet Assistive device: Rolling walker (2 wheeled) Gait Pattern/deviations: Step-to pattern;Decreased stride length     General Gait Details:  shuffle steps to recliner.   Stairs             Wheelchair Mobility    Modified Rankin (Stroke Patients Only)       Balance Overall balance assessment: Needs assistance Sitting-balance support: No upper extremity supported Sitting balance-Leahy Scale: Good     Standing balance support: Bilateral upper extremity supported Standing balance-Leahy Scale: Fair Standing balance comment: reliant on support to take steps, guarding right side                            Cognition Arousal/Alertness: Awake/alert Behavior During Therapy: WFL for tasks assessed/performed Overall Cognitive Status: Within Functional Limits for tasks assessed                                 General Comments: reluctantly participated.      Exercises      General Comments        Pertinent Vitals/Pain Faces Pain Scale: Hurts even more Pain Location: CT site Pain Descriptors / Indicators: Grimacing;Guarding;Discomfort Pain Intervention(s): Monitored during session;Limited activity within patient's tolerance    Home Living                      Prior Function            PT Goals (current goals can now be found in the care plan section) Progress towards PT goals: Progressing toward goals    Frequency    Min 3X/week      PT  Plan Current plan remains appropriate    Co-evaluation              AM-PAC PT "6 Clicks" Mobility   Outcome Measure  Help needed turning from your back to your side while in a flat bed without using bedrails?: A Little Help needed moving from lying on your back to sitting on the side of a flat bed without using bedrails?: A Little Help needed moving to and from a bed to a chair (including a wheelchair)?: A Little Help needed standing up from a chair using your arms (e.g., wheelchair or bedside chair)?: A Little Help needed to walk in hospital room?: A Lot Help needed climbing 3-5 steps with a railing? : A Lot 6 Click  Score: 16    End of Session   Activity Tolerance: Patient tolerated treatment well Patient left: in chair;with call bell/phone within reach;with chair alarm set;with family/visitor present;with nursing/sitter in room Nurse Communication: Mobility status       Time:  - 1017 1150    Charges:    1 Ther. Act.                    Tresa Endo PT Acute Rehabilitation Services Pager [phone] Office [phone]    Claretha Cooper 08/03/2021, 3:55 PM

## 2021-08-03 NOTE — Progress Notes (Signed)
PROGRESS NOTE    William Farrell  EHU:[id number] DOB: [birth year] DOA: 07/30/2021 PCP: [name], DO   Chief Complain: Fatigue, poor oral intake  Brief Narrative: Patient is a 60 year old male with history of stage IIIa non-small cell lung cancer currently on chemotherapy, hyperlipidemia, diabetes type 2 who presented here with complaints of fatigue, poor oral intake for 2 weeks before admission.  On presentation he was found to be in A. fib with RVR, DKA and also found to have loculated right-sided pleural effusion.  PCCM was consulted on presentation, status post right chest tube placement.  Currently on broad-spectrum antibiotics for loculated right-sided pleural effusion.  Oncology following.  A. fib with RVR has resolved and currently in normal sinus rhythm.  Assessment & Plan:   Active Problems:   Atrial fibrillation with rapid ventricular response (HCC)   Malnutrition of moderate degree   Sepsis secondary to loculated right-sided pleural effusion: Presented with shortness of breath.  Status post chest tube placement on 9/15.  Procalcitonin was elevated.  Still has significant leukocytosis,but  afebrile.  He was on broad-spectrum antibiotics with vancomycin, cefepime, Flagyl.  Pleural fluid culture showing streptococcus intermedius, pansensitive.  Blood cultures have remained negative so far.  Antibiotic has been changed to ceftriaxone.  This morning he was on room air.  Still having significant productive cough.  Sputum culture showing few gram-negative coccobacilli. Complains of persistent cough, pain on the chest and back due to cough.  Chest tube is still having significant output.  Plan to remove the chest tube when the drain is less than 50-100 cc  Stage III non-small cell lung cancer: Follows with Dr. Julien Nordmann, currently on chemo.   Oncology following. Chest CT done on 9.18.22 showed  extensive, diffuse pleural thickening and adjacent interlobular septal thickening with  a small, loculated hydropneumothorax, approximately 10% volume,perihilar and suprahilar right lung malignancy with adjacent satellite nodularity,multiple small pulmonary nodules throughout the lungs consistent with worsening pulmonary metastatic disease.  A. fib with RVR: Likely transient, now resolved after starting on Cardizem drip.  Currently in normal sinus rhythm.  TSH normal.  Echocardiogram showed EF of 60 to 65%, normal left ventricular diastolic parameters.  Not a started on anticoagulation because of transient nature of the A. fib with RVR/likely related to pulmonary issues.  DKA/diabetes type 2: Initially started on insulin drip now transition to subcutaneous insulin.  Takes metformin, glipizide, Ozempic at home.  Monitor blood sugars.  Continue current insulin regimen.  Hemoglobin A1c of 6.6.  AKI on CKD stage IIIa: Creatinine was in the range of 2 on presentation.  Baseline creatinine around 1.3.  Currently kidney function at baseline.  Chronic normocytic anemia: Baseline hemoglobin around 8.  Hemoglobin dropped to the range of 7.  No evidence of acute blood loss.  Most likely associated with underlying malignancy, chemotherapy.  Iron studies showed low iron,given IV iron infusion.  Transfused with a unit of PRBC on 08/02/2021  Hyperlipidemia: On statin  Hypothyroidism: On Synthyroid  GERD: Continue PPI  Hypokalemia/hypomagnesemia: Being supplemented and monitored  Moderate malnutrition: Secondary to malignancy.  Nutrition following  Debility/deconditioning: PT/OT evaluated the patient and recommend home health on discharge.  Nutrition Problem: Moderate Malnutrition Etiology: chronic illness, cancer and cancer related treatments      DVT prophylaxis:Lovenox Code Status: Full Family Communication: Wife present at bedside Status is: Inpatient  Remains inpatient appropriate because:Inpatient level of care appropriate due to severity of illness  Dispo: The patient is  from: Home  Anticipated d/c is to: Home              Patient currently is not medically stable to d/c.   Difficult to place patient No    Consultants: Oncology,PCCM  Procedures:Chest tube placement  Antimicrobials:  Anti-infectives (From admission, onward)    Start     Dose/Rate Route Frequency Ordered Stop   08/02/21 1800  cefTRIAXone (ROCEPHIN) 2 g in sodium chloride 0.9 % 100 mL IVPB        2 g 200 mL/hr over 30 Minutes Intravenous Every 24 hours 08/02/21 0947     08/02/21 1100  vancomycin (VANCOREADY) IVPB 1250 mg/250 mL  Status:  Discontinued        1,250 mg 166.7 mL/hr over 90 Minutes Intravenous Every 12 hours 08/02/21 0726 08/02/21 0910   08/01/21 1100  vancomycin (VANCOCIN) IVPB 1000 mg/200 mL premix  Status:  Discontinued        1,000 mg 200 mL/hr over 60 Minutes Intravenous Every 12 hours 08/01/21 0951 08/02/21 0726   07/31/21 1800  vancomycin (VANCOREADY) IVPB 1250 mg/250 mL  Status:  Discontinued        1,250 mg 166.7 mL/hr over 90 Minutes Intravenous Every 24 hours 07/30/21 1911 07/31/21 0845   07/31/21 1800  vancomycin (VANCOREADY) IVPB 1500 mg/300 mL  Status:  Discontinued        1,500 mg 150 mL/hr over 120 Minutes Intravenous Every 24 hours 07/31/21 0845 08/01/21 0951   07/31/21 0945  ceFEPIme (MAXIPIME) 2 g in sodium chloride 0.9 % 100 mL IVPB  Status:  Discontinued        2 g 200 mL/hr over 30 Minutes Intravenous Every 8 hours 07/31/21 0845 08/02/21 0948   07/30/21 2000  metroNIDAZOLE (FLAGYL) IVPB 500 mg        500 mg 100 mL/hr over 60 Minutes Intravenous Every 12 hours 07/30/21 1738     07/30/21 1830  vancomycin (VANCOREADY) IVPB 1750 mg/350 mL        1,750 mg 175 mL/hr over 120 Minutes Intravenous  Once 07/30/21 1738 07/30/21 1846   07/30/21 1800  ceFEPIme (MAXIPIME) 2 g in sodium chloride 0.9 % 100 mL IVPB  Status:  Discontinued        2 g 200 mL/hr over 30 Minutes Intravenous Every 12 hours 07/30/21 1723 07/31/21 0845        Subjective: Patient seen and examined at the bedside this morning.  Hemodynamically stable during my evaluation.  Still having significant productive cough.  He was complaining of chest pain due to persistent cough.  On room air.  Objective: Vitals:   08/03/21 0432 08/03/21 0500 08/03/21 0600 08/03/21 0700  BP: (!) 158/92 127/70 124/68   Pulse: (!) 103 (!) 107 (!) 101   Resp: (!) 23 20 20    Temp:  98.5 F (36.9 C)  97.7 F (36.5 C)  TempSrc:  Oral  Oral  SpO2: 94% 91% 96%   Weight:      Height:        Intake/Output Summary (Last 24 hours) at 08/03/2021 0729 Last data filed at 08/03/2021 0500 Gross per 24 hour  Intake 1519.99 ml  Output 1485 ml  Net 34.99 ml   Filed Weights   07/30/21 1101 07/30/21 1133  Weight: 95.4 kg 88.9 kg    Examination:  General exam: Chronically ill looking, weak HEENT: PERRL Respiratory system: Right-sided chest tube, no frank wheezes or crackles  Cardiovascular system: S1 & S2 heard, RRR.  Gastrointestinal system: Abdomen  is nondistended, soft and nontender. Central nervous system: Alert and oriented Extremities: No edema, no clubbing ,no cyanosis Skin: No rashes, no ulcers,no icterus    Data Reviewed: I have personally reviewed following labs and imaging studies  CBC: Recent Labs  Lab 07/30/21 0927 07/30/21 2219 07/31/21 0232 07/31/21 0900 08/01/21 0445 08/02/21 0522  WBC 44.5* 35.1* 30.7*  --  19.4* 17.6*  NEUTROABS 41.6*  --   --   --   --   --   HGB 9.2* 8.3* 7.5* 7.0* 7.3* 7.0*  HCT 28.3* 25.9* 23.4* 22.5* 24.6* 22.9*  MCV 87.6 89.3 88.6  --  93.9 94.6  PLT 530* 217 380  --  343 751   Basic Metabolic Panel: Recent Labs  Lab 07/30/21 2115 07/31/21 0232 07/31/21 0800 08/01/21 0445 08/02/21 0522  NA 136 137 138 143 141  K 4.8 4.5 4.3 4.4 3.4*  CL 104 107 106 113* 116*  CO2 19* 22 21* 20* 19*  GLUCOSE 143* 169* 173* 187* 132*  BUN 69* 65* 62* 41* 23*  CREATININE 1.48*  1.50* 1.35* 1.23 1.06 0.66  CALCIUM 9.3 9.4  9.4 9.5 7.7*  MG  --   --   --  2.1 1.6*   GFR: Estimated Creatinine Clearance: 107.8 mL/min (by C-G formula based on SCr of 0.66 mg/dL). Liver Function Tests: Recent Labs  Lab 07/30/21 0927 07/30/21 1822 07/31/21 0232  AST 20  --  14*  ALT 16  --  14  ALKPHOS 216*  --  139*  BILITOT 0.4  --  0.4  PROT 8.0 7.4 7.0  ALBUMIN 1.9*  --  2.1*   No results for input(s): LIPASE, AMYLASE in the last 168 hours. No results for input(s): AMMONIA in the last 168 hours. Coagulation Profile: Recent Labs  Lab 07/31/21 0232  INR 1.7*   Cardiac Enzymes: No results for input(s): CKTOTAL, CKMB, CKMBINDEX, TROPONINI in the last 168 hours. BNP (last 3 results) No results for input(s): PROBNP in the last 8760 hours. HbA1C: Recent Labs    07/31/21 0800  HGBA1C 6.6*   CBG: Recent Labs  Lab 08/01/21 2208 08/02/21 0806 08/02/21 1151 08/02/21 1632 08/02/21 2132  GLUCAP 139* 154* 252* 153* 163*   Lipid Profile: No results for input(s): CHOL, HDL, LDLCALC, TRIG, CHOLHDL, LDLDIRECT in the last 72 hours. Thyroid Function Tests: Recent Labs    07/31/21 0800  TSH 2.355   Anemia Panel: Recent Labs    07/31/21 0800  FERRITIN 1,352*  TIBC 174*  IRON 25*   Sepsis Labs: Recent Labs  Lab 07/30/21 2115 07/30/21 2235 07/31/21 0232  PROCALCITON  --   --  3.21  LATICACIDVEN 1.2 2.1*  --     Recent Results (from the past 240 hour(s))  Resp Panel by RT-PCR (Flu A&B, Covid) Nasopharyngeal Swab     Status: None   Collection Time: 07/30/21 11:19 AM   Specimen: Nasopharyngeal Swab; Nasopharyngeal(NP) swabs in vial transport medium  Result Value Ref Range Status   SARS Coronavirus 2 by RT PCR NEGATIVE NEGATIVE Final    Comment: (NOTE) SARS-CoV-2 target nucleic acids are NOT DETECTED.  The SARS-CoV-2 RNA is generally detectable in upper respiratory specimens during the acute phase of infection. The lowest concentration of SARS-CoV-2 viral copies this assay can detect is 138  copies/mL. A negative result does not preclude SARS-Cov-2 infection and should not be used as the sole basis for treatment or other patient management decisions. A negative result may occur with  improper  specimen collection/handling, submission of specimen other than nasopharyngeal swab, presence of viral mutation(s) within the areas targeted by this assay, and inadequate number of viral copies(<138 copies/mL). A negative result must be combined with clinical observations, patient history, and epidemiological information. The expected result is Negative.  Fact Sheet for Patients:  EntrepreneurPulse.com.au  Fact Sheet for Healthcare Providers:  IncredibleEmployment.be  This test is no t yet approved or cleared by the [place] FDA and  has been authorized for detection and/or diagnosis of SARS-CoV-2 by FDA under an Emergency Use Authorization (EUA). This EUA will remain  in effect (meaning this test can be used) for the duration of the COVID-19 declaration under Section 564(b)(1) of the Act, 21 U.S.C.section 360bbb-3(b)(1), unless the authorization is terminated  or revoked sooner.       Influenza A by PCR NEGATIVE NEGATIVE Final   Influenza B by PCR NEGATIVE NEGATIVE Final    Comment: (NOTE) The Xpert Xpress SARS-CoV-2/FLU/RSV plus assay is intended as an aid in the diagnosis of influenza from Nasopharyngeal swab specimens and should not be used as a sole basis for treatment. Nasal washings and aspirates are unacceptable for Xpert Xpress SARS-CoV-2/FLU/RSV testing.  Fact Sheet for Patients: EntrepreneurPulse.com.au  Fact Sheet for Healthcare Providers: IncredibleEmployment.be  This test is not yet approved or cleared by the [place] FDA and has been authorized for detection and/or diagnosis of SARS-CoV-2 by FDA under an Emergency Use Authorization (EUA). This EUA will remain in effect (meaning  this test can be used) for the duration of the COVID-19 declaration under Section 564(b)(1) of the Act, 21 U.S.C. section 360bbb-3(b)(1), unless the authorization is terminated or revoked.  Performed at [facility], [place] [address], [place], [place] [address]   Body fluid culture w Gram Stain     Status: None (Preliminary result)   Collection Time: 07/30/21  5:35 PM   Specimen: Pleura; Body Fluid  Result Value Ref Range Status   Specimen Description   Final    PLEURAL RIGHT Performed at [place] [address] [place], [place] [address]    Special Requests   Final    NONE Performed at [facility], [place] [address], [place], [place] [address]    Gram Stain   Final    ABUNDANT WBC PRESENT,BOTH PMN AND MONONUCLEAR FEW GRAM POSITIVE COCCI Performed at [facility], [place] [address], [place], [place] [address]    Culture FEW STREPTOCOCCUS INTERMEDIUS  Final   Report Status PENDING  Incomplete   Organism ID, Bacteria STREPTOCOCCUS INTERMEDIUS  Final      Susceptibility   Streptococcus intermedius - MIC*    PENICILLIN <=0.06 SENSITIVE Sensitive     CEFTRIAXONE 0.25 SENSITIVE Sensitive     ERYTHROMYCIN <=0.12 SENSITIVE Sensitive     LEVOFLOXACIN 0.5 SENSITIVE Sensitive     VANCOMYCIN 0.5 SENSITIVE Sensitive     * FEW STREPTOCOCCUS INTERMEDIUS  Culture, blood (routine x 2)     Status: None (Preliminary result)   Collection Time: 07/30/21 10:35 PM   Specimen: Right Antecubital; Blood  Result Value Ref Range Status   Specimen Description   Final    RIGHT ANTECUBITAL Performed at [facility] [address] [place], [place] [address]    Special Requests   Final    BOTTLES DRAWN AEROBIC AND ANAEROBIC Blood Culture adequate volume Performed at [place] [address] [place], [place] [address]    Culture   Final  NO GROWTH 2 DAYS Performed at [facility],  [place] [address] [place], [place] [address]    Report Status PENDING  Incomplete  Culture, blood (routine x 2)     Status: None (Preliminary result)   Collection Time: 07/30/21 10:35 PM   Specimen: Left Antecubital; Blood  Result Value Ref Range Status   Specimen Description   Final    LEFT ANTECUBITAL Performed at [place] [address], [place], [place] [address]    Special Requests   Final    BOTTLES DRAWN AEROBIC AND ANAEROBIC Blood Culture adequate volume Performed at [place] [address], [place], [place] [address]    Culture   Final    NO GROWTH 2 DAYS Performed at [facility] [address], [place], [place] [address]    Report Status PENDING  Incomplete  MRSA Next Gen by PCR, Nasal     Status: None   Collection Time: 07/30/21 11:07 PM   Specimen: Nasal Mucosa; Nasal Swab  Result Value Ref Range Status   MRSA by PCR Next Gen NOT DETECTED NOT DETECTED Final    Comment: (NOTE) The GeneXpert MRSA Assay (FDA approved for NASAL specimens only), is one component of a comprehensive MRSA colonization surveillance program. It is not intended to diagnose MRSA infection nor to guide or monitor treatment for MRSA infections. Test performance is not FDA approved in patients less than 12 years old. Performed at [facility], [place] [address], [place], [place] [address]   Urine Culture     Status: Abnormal   Collection Time: 07/31/21  2:20 PM   Specimen: Urine, Clean Catch  Result Value Ref Range Status   Specimen Description   Final    URINE, CLEAN CATCH Performed at [facility], [place] [address], [place], [place] [address]    Special Requests   Final    NONE Performed at [facility], [place] [address] [place], [place] [address]    Culture (A)  Final    <10,000 COLONIES/mL INSIGNIFICANT GROWTH Performed at [place] [address], [place], [place] [address]     Report Status 08/02/2021 FINAL  Final  Expectorated Sputum Assessment w Gram Stain, Rflx to Resp Cult     Status: None   Collection Time: 08/02/21 10:15 AM   Specimen: Expectorated Sputum  Result Value Ref Range Status   Specimen Description EXPECTORATED SPUTUM  Final   Special Requests NONE  Final   Sputum evaluation   Final    THIS SPECIMEN IS ACCEPTABLE FOR SPUTUM CULTURE Performed at [facility], [place] [address] [place], [place] [address]    Report Status 08/02/2021 FINAL  Final  Culture, Respiratory w Gram Stain     Status: None (Preliminary result)   Collection Time: 08/02/21 10:15 AM  Result Value Ref Range Status   Specimen Description   Final    EXPECTORATED SPUTUM Performed at [place] [address], [place], [place] [address]    Special Requests   Final    NONE Reflexed from [id number] Performed at [facility], [place] [address] [place], [place] [address]    Gram Stain   Final    FEW WBC PRESENT, PREDOMINANTLY PMN FEW GRAM NEGATIVE COCCOBACILLI Performed at [facility], [place] [address], [place], [place] [address]    Culture PENDING  Incomplete   Report Status PENDING  Incomplete  Radiology Studies: CT CHEST W CONTRAST  Result Date: 08/02/2021 CLINICAL DATA:  Empyema, chest tube placement, shortness of breath, history of lung cancer EXAM: CT CHEST WITH CONTRAST TECHNIQUE: Multidetector CT imaging of the chest was performed during intravenous contrast administration. CONTRAST:  38mL OMNIPAQUE IOHEXOL 350 MG/ML SOLN COMPARISON:  06/08/2021 FINDINGS: Cardiovascular: Left chest port catheter. Normal heart size. No pericardial effusion. Mediastinum/Nodes: No significant change in extensive soft tissue about the right hilum with matted appearing pretracheal lymph nodes (series 2, image 59, 49). Thyroid gland, trachea, and esophagus demonstrate no significant findings. Lungs/Pleura: There has been  interval placement of a pigtail chest tube about the posterior, inferior right lung base (series 7, image 75). There is extensive, diffuse pleural thickening and adjacent interlobular septal thickening with a small, loculated hydropneumothorax, approximately 10% volume. Similar post treatment appearance of underlying perihilar and suprahilar right lung malignancy with adjacent satellite nodularity. Multiple small pulmonary nodules throughout the lungs are new and enlarged, particularly in the left lung, for example a nodule of the anterior left upper lobe measuring 0.7 cm previously no greater than 0.3 cm (series 5, image 47). New, trace left pleural effusion. Upper Abdomen: No acute abnormality. Small volume perihepatic ascites. Musculoskeletal: No chest wall mass or suspicious bone lesions identified. IMPRESSION: 1. There has been interval placement of a pigtail chest tube about the posterior, inferior right lung base. 2. There is extensive, diffuse pleural thickening and adjacent interlobular septal thickening with a small, loculated hydropneumothorax, approximately 10% volume. This trapped lung appearance may be secondary to pleural metastatic disease and or empyema. 3. New, trace left pleural effusion. 4. Similar post treatment appearance of underlying perihilar and suprahilar right lung malignancy with adjacent satellite nodularity. 5. Multiple small pulmonary nodules throughout the lungs are new and enlarged, consistent with worsening pulmonary metastatic disease. 6. Small volume perihepatic ascites. Electronically Signed   By: Eddie Candle M.D.   On: 08/02/2021 13:44        Scheduled Meds:  aspirin EC  81 mg Oral QHS   atorvastatin  40 mg Oral QHS   Chlorhexidine Gluconate Cloth  6 each Topical Daily   chlorpheniramine-HYDROcodone  5 mL Oral Q12H   enoxaparin (LOVENOX) injection  40 mg Subcutaneous Q24H   feeding supplement  1 Container Oral TID BM   ferrous sulfate  325 mg Oral Q breakfast    guaiFENesin  600 mg Oral BID   insulin aspart  0-15 Units Subcutaneous TID WC   insulin aspart  0-5 Units Subcutaneous QHS   insulin glargine-yfgn  20 Units Subcutaneous Daily   levothyroxine  75 mcg Oral Q0600   lidocaine  1 patch Transdermal Daily   mouth rinse  15 mL Mouth Rinse BID   multivitamin with minerals  1 tablet Oral Daily   pantoprazole  40 mg Oral Daily   revefenacin  175 mcg Nebulization Daily   senna-docusate  2 tablet Oral QHS   sodium chloride flush  10-40 mL Intracatheter Q12H   Continuous Infusions:  cefTRIAXone (ROCEPHIN)  IV Stopped (08/02/21 1825)   metronidazole Stopped (08/02/21 2201)     LOS: 4 days    Time spent:35 mins, More than 50% of that time was spent in counseling and/or coordination of care.      Shelly Coss, MD [facility] P9/19/2022, 7:29 AM

## 2021-08-03 NOTE — Progress Notes (Signed)
Morning labs could not be collected.  No blood return with left chest port.  IV team consulted.

## 2021-08-03 NOTE — Progress Notes (Signed)
NAME:  William Farrell, MRN:  [id number], DOB:  28-Mar-1961, LOS: 4 ADMISSION DATE:  07/30/2021, CONSULTATION DATE:  9/15 REFERRING MD:  Dr Marylyn Ishihara, CHIEF COMPLAINT:  SOB/weakness   History of Present Illness:  60 year old male with prior history of Stage IIIa NSCLC (SCC) RUL diagnosed 02/2020 (RUL/suprahilar mass and R paratracheal and subcarinal LAD) s/p chemo XRT including keytruda, consolidation IT with durvalumab, SBRT to RUL nodules followed by Dr. Julien Nordmann. He is receiving neulasta support.   Presented to Dr. Julien Nordmann for follow-up, reportedly not felt well for 1-2 weeks, 15 lb weight loss since his prior visit, pain under right breast, worse with cough and progressive dyspnea on exertion even on his home O2.  He has had productive cough, orthopnea that has worsened over the last week. He has borrowed his wife's oxygen. A little hemoptysis, but no nausea, vomiting, or diarrhea.  No fever/chills.  He does acknowledge some trouble with aspiration of solids/liquids. Was due to start cycle #12 today.  Pertinent  Medical History  NSCLC stage IIIA (dx 02/2020)  Significant Hospital Events: Including procedures, antibiotic start and stop dates in addition to other pertinent events   Chest tube placement 07/30/2021  Interim History / Subjective:   Chest tube output down to 160cc last 24 h No fever On CTX    Objective   Blood pressure 137/84, pulse 95, temperature 97.7 F (36.5 C), temperature source Oral, resp. rate 17, height 6' (1.829 m), weight 88.9 kg, SpO2 96 %.    FiO2 (%):  [28 %] 28 %   Intake/Output Summary (Last 24 hours) at 08/03/2021 0945 Last data filed at 08/03/2021 0800 Gross per 24 hour  Intake 1481.11 ml  Output 1485 ml  Net -3.89 ml   Filed Weights   07/30/21 1101 07/30/21 1133  Weight: 95.4 kg 88.9 kg    Examination: General: Chronically ill-appearing man laying in bed, comfortable HENT: Oropharynx clear, no stridor Lungs: Decreased bilateral breath sounds  especially on the right, few right inspiratory crackles.  Right posterior chest tube site clean and dry Cardiovascular: Regular, distant, no murmur Abdomen: Soft, nondistended, positive bowel sounds Extremities: Trace pretibial edema Neuro: Awake, alert, nonfocal  Resolved Hospital Problem list     Assessment & Plan:   Right strep intermedius empyema with chest tube in place 9/15.  Draining purulent fluid, 150 cc last 24 hours. -Cytology on the pleural fluid is still pending -Following chest tube output, consider removal pigtail catheter once 50-100 cc per 24 hours. -Reviewed CT chest 9/18, did not show any residual fluid pockets.  There is residual pneumothorax, suspect due to some component of trapped lung -Continue ceftriaxone, day 4 of antibiotics.  Believe we can discontinue metronidazole 9/19 -Pain control with chest tube  Non-small cell lung cancer, currently undergoing therapy.  Concern for possible progressive disease most recent CT -Appreciate oncology assistance and input -Any therapeutic options on hold for now in the setting of active infection  Atrial fibrillation with RVR, resolved -Currently off scheduled therapeutic anticoagulation, on enoxaparin prophylaxis  Diabetes mellitus type 2 -Sliding scale insulin and as directed by primary service   Best Practice (right click and "Reselect all SmartList Selections" daily)   Diet/type: Regular consistency (see orders) DVT prophylaxis:  GI prophylaxis: PPI Lines: N/A Foley:  N/A Code Status:  full code Last date of multidisciplinary goals of care discussion [Per primary] Family: Updated the patient and his wife at bedside on 9/19  Labs   CBC: Recent Labs  Lab 07/30/21  0973 07/30/21 0927 07/30/21 2219 07/31/21 0232 07/31/21 0900 08/01/21 0445 08/02/21 0522 08/03/21 0746  WBC 44.5*  --  35.1* 30.7*  --  19.4* 17.6* 25.8*  NEUTROABS 41.6*  --   --   --   --   --   --   --   HGB 9.2*   < > 8.3* 7.5* 7.0* 7.3*  7.0* 8.8*  HCT 28.3*  --  25.9* 23.4* 22.5* 24.6* 22.9* 28.4*  MCV 87.6  --  89.3 88.6  --  93.9 94.6 91.3  PLT 530*  --  217 380  --  343 287 366   < > = values in this interval not displayed.    Basic Metabolic Panel: Recent Labs  Lab 07/31/21 0232 07/31/21 0800 08/01/21 0445 08/02/21 0522 08/03/21 0559 08/03/21 0746  NA 137 138 143 141  --  138  K 4.5 4.3 4.4 3.4*  --  4.3  CL 107 106 113* 116*  --  109  CO2 22 21* 20* 19*  --  21*  GLUCOSE 169* 173* 187* 132*  --  152*  BUN 65* 62* 41* 23*  --  19  CREATININE 1.35* 1.23 1.06 0.66  --  0.83  CALCIUM 9.4 9.4 9.5 7.7*  --  9.2  MG  --   --  2.1 1.6* 1.9  --    GFR: Estimated Creatinine Clearance: 103.9 mL/min (by C-G formula based on SCr of 0.83 mg/dL). Recent Labs  Lab 07/30/21 2115 07/30/21 2219 07/30/21 2235 07/31/21 0232 08/01/21 0445 08/02/21 0522 08/03/21 0746  PROCALCITON  --   --   --  3.21  --   --   --   WBC  --    < >  --  30.7* 19.4* 17.6* 25.8*  LATICACIDVEN 1.2  --  2.1*  --   --   --   --    < > = values in this interval not displayed.    Liver Function Tests: Recent Labs  Lab 07/30/21 0927 07/30/21 1822 07/31/21 0232  AST 20  --  14*  ALT 16  --  14  ALKPHOS 216*  --  139*  BILITOT 0.4  --  0.4  PROT 8.0 7.4 7.0  ALBUMIN 1.9*  --  2.1*   No results for input(s): LIPASE, AMYLASE in the last 168 hours. No results for input(s): AMMONIA in the last 168 hours.  ABG    Component Value Date/Time   HCO3 18.6 (L) 07/30/2021 2235   ACIDBASEDEF 5.5 (H) 07/30/2021 2235   O2SAT 86.8 07/30/2021 2235     Coagulation Profile: Recent Labs  Lab 07/31/21 0232  INR 1.7*    Cardiac Enzymes: No results for input(s): CKTOTAL, CKMB, CKMBINDEX, TROPONINI in the last 168 hours.  HbA1C: Hemoglobin A1C  Date/Time Value Ref Range Status  06/10/2021 08:32 AM 6.8 (A) 4.0 - 5.6 % Final  03/11/2021 08:37 AM 7.5 (A) 4.0 - 5.6 % Final   Hgb A1c MFr Bld  Date/Time Value Ref Range Status  07/31/2021  08:00 AM 6.6 (H) 4.8 - 5.6 % Final    Comment:    (NOTE) Pre diabetes:          5.7%-6.4%  Diabetes:              >6.4%  Glycemic control for   <7.0% adults with diabetes   01/14/2020 08:59 AM 7.1 (H) <5.7 % of total Hgb Final    Comment:    For someone  without known diabetes, a hemoglobin A1c value of 6.5% or greater indicates that they may have  diabetes and this should be confirmed with a follow-up  test. . For someone with known diabetes, a value <7% indicates  that their diabetes is well controlled and a value  greater than or equal to 7% indicates suboptimal  control. A1c targets should be individualized based on  duration of diabetes, age, comorbid conditions, and  other considerations. . Currently, no consensus exists regarding use of hemoglobin A1c for diagnosis of diabetes for children. .     CBG: Recent Labs  Lab 08/02/21 0806 08/02/21 1151 08/02/21 1632 08/02/21 2132 08/03/21 0744  GLUCAP 154* 252* 153* 163* 139*     Baltazar Apo, MD, PhD 08/03/2021, 9:45 AM [facility] [phone] or if no answer before 7:00PM call [phone] For any issues after 7:00PM please call eLink [phone]

## 2021-08-03 NOTE — TOC Progression Note (Signed)
Transition of Care [facility]) - Progression Note    Patient Details  Name: William Farrell MRN: [id number] Date of Birth: 10-18-1961  Transition [facility]) CM/SW Contact  Leeroy Cha, RN Phone Number: 08/03/2021, 8:15 AM  Clinical Narrative:    Significant Hospital Events: Including procedures, antibiotic start and stop dates in addition to other pertinent events   Chest tube placement 07/30/2021 TOC PLAN OF CARE: to return to home with self care.  Following for progression and removal of chest tube.  Expected Discharge Plan: [place] Barriers to Discharge: Continued Medical Work up  Expected Discharge Plan and Services Expected Discharge Plan: [place]   Discharge Planning Services: CM Consult   Living arrangements for the past 2 months: Single Family Home                                       Social Determinants of Health (SDOH) Interventions    Readmission Risk Interventions No flowsheet data found.

## 2021-08-04 DIAGNOSIS — J869 Pyothorax without fistula: Secondary | ICD-10-CM | POA: Diagnosis not present

## 2021-08-04 DIAGNOSIS — Z515 Encounter for palliative care: Secondary | ICD-10-CM | POA: Diagnosis not present

## 2021-08-04 DIAGNOSIS — Z7189 Other specified counseling: Secondary | ICD-10-CM | POA: Diagnosis not present

## 2021-08-04 DIAGNOSIS — C3411 Malignant neoplasm of upper lobe, right bronchus or lung: Secondary | ICD-10-CM | POA: Diagnosis not present

## 2021-08-04 LAB — CULTURE, RESPIRATORY W GRAM STAIN: Culture: NORMAL

## 2021-08-04 LAB — GLUCOSE, CAPILLARY
Glucose-Capillary: 132 mg/dL — ABNORMAL HIGH (ref 70–99)
Glucose-Capillary: 142 mg/dL — ABNORMAL HIGH (ref 70–99)
Glucose-Capillary: 116 mg/dL — ABNORMAL HIGH (ref 70–99)
Glucose-Capillary: 125 mg/dL — ABNORMAL HIGH (ref 70–99)

## 2021-08-04 LAB — CBC WITH DIFFERENTIAL/PLATELET
Abs Immature Granulocytes: 0.24 10*3/uL — ABNORMAL HIGH (ref 0.00–0.07)
Basophils Absolute: 0.1 10*3/uL (ref 0.0–0.1)
Basophils Relative: 0 %
Eosinophils Absolute: 0.2 10*3/uL (ref 0.0–0.5)
Eosinophils Relative: 1 %
HCT: 30 % — ABNORMAL LOW (ref 39.0–52.0)
Hemoglobin: 9.1 g/dL — ABNORMAL LOW (ref 13.0–17.0)
Immature Granulocytes: 1 %
Lymphocytes Relative: 3 %
Lymphs Abs: 0.8 10*3/uL (ref 0.7–4.0)
MCH: 27.9 pg (ref 26.0–34.0)
MCHC: 30.3 g/dL (ref 30.0–36.0)
MCV: 92 fL (ref 80.0–100.0)
Monocytes Absolute: 1.4 10*3/uL — ABNORMAL HIGH (ref 0.1–1.0)
Monocytes Relative: 6 %
Neutro Abs: 21.7 10*3/uL — ABNORMAL HIGH (ref 1.7–7.7)
Neutrophils Relative %: 89 %
Platelets: 374 10*3/uL (ref 150–400)
RBC: 3.26 MIL/uL — ABNORMAL LOW (ref 4.22–5.81)
RDW: 20.3 % — ABNORMAL HIGH (ref 11.5–15.5)
WBC: 24.4 10*3/uL — ABNORMAL HIGH (ref 4.0–10.5)
nRBC: 0.1 % (ref 0.0–0.2)

## 2021-08-04 LAB — BASIC METABOLIC PANEL
Anion gap: 8 (ref 5–15)
BUN: 16 mg/dL (ref 6–20)
CO2: 23 mmol/L (ref 22–32)
Calcium: 9 mg/dL (ref 8.9–10.3)
Chloride: 106 mmol/L (ref 98–111)
Creatinine, Ser: 0.78 mg/dL (ref 0.61–1.24)
GFR, Estimated: >60 mL/min (ref >60)
Glucose, Bld: 137 mg/dL — ABNORMAL HIGH (ref 70–99)
Potassium: 4.5 mmol/L (ref 3.5–5.1)
Sodium: 137 mmol/L (ref 135–145)

## 2021-08-04 LAB — MAGNESIUM: Magnesium: 1.7 mg/dL (ref 1.7–2.4)

## 2021-08-04 NOTE — Progress Notes (Signed)
Physical Therapy Treatment Patient Details Name: William Farrell MRN: [id number] DOB: 02-12-1961 Today's Date: 08/04/2021   History of Present Illness Pt is 60 year old with history of stage IIIa non-small cell lung cancer on chemo who presents to the hospital for fatigue and poor oral intake for the past 2 weeks on 07/30/21.  He was found to be in atrial fibrillation with RVR, DKA and had loculated pleural effusion. Chest tube was inserted 07/30/2021. Pt with other hx of HLD and DM2    PT Comments    Pt is agreeable to OOB mobility, was pre-medicated with IV pain meds and drowsy, but agreeable to OOB mobility.  He still has CT to continuous wall suction, so we were limited to OOB to chair.  If he can be on intermittent suction or has CT removed (I believe his output is still too high today) then we can start to mobilize down the hallway, but as the order is written currently he is to continuous suction. He did participate in LE seated exercises, but kept falling asleep having to be re-aroused to complete. PT will continue to follow acutely for safe mobility progression.  When he is ready for hallway ambulation bring rollator or follow with a chair.   Recommendations for follow up therapy are one component of a multi-disciplinary discharge planning process, led by the attending physician.  Recommendations may be updated based on patient status, additional functional criteria and insurance authorization.  Follow Up Recommendations  Home health PT;Supervision for mobility/OOB     Equipment Recommendations  Other (comment) (rollator-4 wheeled RW with seat)    Recommendations for Other Services       Precautions / Restrictions Precautions Precautions: Fall Precaution Comments: R CT to wall suction     Mobility  Bed Mobility Overal bed mobility: Needs Assistance Bed Mobility: Supine to Sit     Supine to sit: Min guard     General bed mobility comments: Min guard assist for safety,  line management and awareness    Transfers Overall transfer level: Needs assistance Equipment used: Rolling walker (2 wheeled) Transfers: Sit to/from [facility] Sit to Stand: Min guard Stand pivot transfers: Min guard;+2 safety/equipment;From elevated surface       General transfer comment: Min guard assist for safety during transitions, used RW, but pt felt he did not need it (was helpful to hold the chest tube box).  Pt generally weak and wobbly on his feet two person assist helpful for line management and if we can get to intermittent suction, we can progress gait down the hallway would benefit from chair to follow or bringing rollator to be able to sit with fatigue.  Ambulation/Gait                 Stairs             Wheelchair Mobility    Modified Rankin (Stroke Patients Only)       Balance Overall balance assessment: Needs assistance Sitting-balance support: Feet supported;No upper extremity supported;Bilateral upper extremity supported Sitting balance-Leahy Scale: Good     Standing balance support: Bilateral upper extremity supported;No upper extremity supported Standing balance-Leahy Scale: Fair                              Cognition Arousal/Alertness: Awake/alert Behavior During Therapy: Flat affect Overall Cognitive Status: Within Functional Limits for tasks assessed  General Comments: no obvious cognitive deficits, flat and irritable affect, but does not feel good, so expected.      Exercises General Exercises - Lower Extremity Long Arc Quad: AROM;Both;10 reps;Seated Hip Flexion/Marching: AROM;Both;10 reps;Seated Toe Raises: AROM;Both;20 reps;Seated Heel Raises: Both;AROM;20 reps;Seated    General Comments General comments (skin integrity, edema, etc.): O2 sats low 90s on RA during mobility.      Pertinent Vitals/Pain Pain Assessment: Faces Faces Pain Scale:  Hurts even more Pain Location: CT site and neck/shoulders Pain Descriptors / Indicators: Grimacing;Guarding;Discomfort Pain Intervention(s): Limited activity within patient's tolerance;Monitored during session;Repositioned;Heat applied    Home Living                      Prior Function            PT Goals (current goals can now be found in the care plan section) Acute Rehab PT Goals Patient Stated Goal: return home; walk more; get chest tube out Progress towards PT goals: Progressing toward goals    Frequency    Min 3X/week      PT Plan Current plan remains appropriate    Co-evaluation              AM-PAC PT "6 Clicks" Mobility   Outcome Measure  Help needed turning from your back to your side while in a flat bed without using bedrails?: A Little Help needed moving from lying on your back to sitting on the side of a flat bed without using bedrails?: A Little Help needed moving to and from a bed to a chair (including a wheelchair)?: A Little Help needed standing up from a chair using your arms (e.g., wheelchair or bedside chair)?: A Little Help needed to walk in hospital room?: A Little Help needed climbing 3-5 steps with a railing? : A Lot 6 Click Score: 17    End of Session   Activity Tolerance: Patient limited by fatigue;Patient limited by pain Patient left: in chair;with call bell/phone within reach;with family/visitor present Nurse Communication: Other (comment) (RN in room assisting with mobility) PT Visit Diagnosis: Other abnormalities of gait and mobility (R26.89);Muscle weakness (generalized) (M62.81)     Time: 6789-3810 PT Time Calculation (min) (ACUTE ONLY): 23 min  Charges:  $Therapeutic Exercise: 8-22 mins $Therapeutic Activity: 8-22 mins                     Verdene Lennert, PT, DPT  Acute Rehabilitation Ortho Tech Supervisor [phone] pager [phone]) [phone] office

## 2021-08-04 NOTE — Progress Notes (Signed)
Subjective: The patient is seen and examined today.  His wife was at the bedside.  He is feeling a little bit better today after the chest tube insertion and drainage of the empyema.  He continues to have mild right-sided chest pain as well as shortness of breath at baseline increased with exertion and dry cough.  He denied having any current fever or chills.  He has no nausea, vomiting, diarrhea or constipation.  He has no headache or visual changes.  Objective: Vital signs in last 24 hours: Temp:  [97.7 F (36.5 C)-98.6 F (37 C)] 98.4 F (36.9 C) (09/20 1200) Pulse Rate:  [93-108] 100 (09/20 1300) Resp:  [4-22] 20 (09/20 1300) BP: (103-143)/(53-83) 134/76 (09/20 1309) SpO2:  [91 %-100 %] 94 % (09/20 1300)  Intake/Output from previous day: 09/19 0701 - 09/20 0700 In: 986.4 [P.O.:720; I.V.:10; IV Piggyback:236.4] Out: 905 [Urine:775; Chest Tube:130] Intake/Output this shift: Total I/O In: 56.7 [IV Piggyback:56.7] Out: -   General appearance: alert, cooperative, fatigued, and no distress Resp: diminished breath sounds RLL and dullness to percussion RLL Cardio: regular rate and rhythm, S1, S2 normal, no murmur, click, rub or gallop GI: soft, non-tender; bowel sounds normal; no masses,  no organomegaly Extremities: extremities normal, atraumatic, no cyanosis or edema  Lab Results:  Recent Labs    08/03/21 0746 08/04/21 0542  WBC 25.8* 24.4*  HGB 8.8* 9.1*  HCT 28.4* 30.0*  PLT 366 374   BMET Recent Labs    08/03/21 0746 08/04/21 0542  NA 138 137  K 4.3 4.5  CL 109 106  CO2 21* 23  GLUCOSE 152* 137*  BUN 19 16  CREATININE 0.83 0.78  CALCIUM 9.2 9.0    Studies/Results: No results found.  Medications: I have reviewed the patient's current medications.    Assessment/Plan: This is a very pleasant 60 years old white male with recurrent non-small cell lung cancer initially diagnosed as a stage IIIa (T2 a, N2, M0) non-small cell lung cancer, squamous cell carcinoma  in April 2021 status post a course of concurrent chemoradiation with weekly carboplatin and paclitaxel followed by 3 cycles of consolidation immunotherapy with Imfinzi discontinued secondary to disease progression.  The patient started systemic chemotherapy with carboplatin, paclitaxel and Keytruda for 3 cycles but this was also discontinued secondary to disease progression.  He underwent SBRT to the enlarging right upper lobe lung nodule under the care of Dr. Lisbeth Renshaw.  The patient started second line systemic chemotherapy with docetaxel and Cyramza status post 11 cycles.  He has a rough time tolerating this treatment because of the significant adverse effects but he has initial response to the treatment except the last scans that showed slight increase in right supraclavicular lymph node. The patient was admitted to the hospital with failure to thrive in addition to shortness of breath and weakness. He had repeat CT scan of the chest during this admission that showed evidence for disease progression as well as suspicious empyema involving the right pleural space.  He is status post chest tube placement for drainage of the empyema and feeling a little bit better.  The culture of the pleural fluid showed Streptococcus intermedius.  He is currently on treatment with antibiotic with Rocephin. I had a lengthy discussion with the patient and his wife today about his condition and treatment options.  I recommended for the patient to continue his current treatment with Rocephin and other care as recommended by the critical care team. Regarding the progressive lung cancer, his condition  is poor at this point to resume or restart any new systemic chemotherapy.  I will wait until complete resolution of the infection and improvement of his general condition before discussion of other treatment options like gemcitabine. The patient and his wife are in agreement with the current plan. Thank you for taking good care of Mr.  Quebedeaux, I will continue to follow-up the patient with you and assist in his management on as-needed basis.   LOS: 5 days    Eilleen Kempf 08/04/2021

## 2021-08-04 NOTE — Progress Notes (Signed)
Daily Progress Note   Patient Name: William Farrell       Date: 08/04/2021 DOB: Aug 09, 1961  Age: 60 y.o. MRN#: [id number] Attending Physician: Shelly Coss, MD Primary Care Physician: [name], DO Admit Date: 07/30/2021  Reason for Consultation/Follow-up: Establishing goals of care  Patient Profile/HPI: 60 y.o. male  with past medical history of stage IIIa non-small cell lung cancer currently on chemotherapy, hyperlipidemia, diabetes type 2 admitted on 07/30/2021 with cc of fatigue, poor oral intake for 2 weeks prior to admission. On workup found to have AFib RVR, DKA, and loculated right pleural effusion. He is s/p chest tube placement, on antibiotics. AFib resolved to NSR after cardizem gtt, AKI at baseline, WBC trending down from loculated effusion/infection (45 -> 19 today). Anemia and malnutrition likely secondary to malignancy, dietician has seen/made recommendations. He is deconditioned and PT/OT recommending HHC PT/OT.     PMT was consulted for [place].  Subjective: Patient is sleeping soundly with his wife at bedside. [name] shares that his pain medication tends to make his sleepy. She notes that Oncology Dr. Julien Nordmann visited today and was hopeful regarding future treatment options for his cancer which unfortunately has progressed.  She and [name] have not had any further discussions about Advanced Directives since Palliative [place], NP visit on 9/17. However, she acknowledges that it is an important discussion. Her hope is to continue these discussions once his acute illness resolves.  She notes that the most important goal for William Farrell is to be able to ambulate in his home without feeling short of breath. They have a swimming pool in their backyard that [name] just wants to  be able to walk out to and sit beside.  [name] is agreeable to outpatient Palliative followup.   Vital Signs: BP (!) 143/60   Pulse (!) 102   Temp 98.4 F (36.9 C) (Oral)   Resp 11   Ht 6' (1.829 m)   Wt 88.9 kg   SpO2 96%   BMI 26.58 kg/m  SpO2: SpO2: 96 % O2 Device: O2 Device: Nasal Cannula O2 Flow Rate: O2 Flow Rate (L/min): 2 L/min  Intake/output summary:  Intake/Output Summary (Last 24 hours) at 08/04/2021 1505 Last data filed at 08/04/2021 1200 Gross per 24 hour  Intake 127.91 ml  Output 905 ml  Net -777.09 ml   LBM:  Last BM Date: 08/01/21 Baseline Weight: Weight: 95.4 kg Most recent weight: Weight: 88.9 kg       Palliative Assessment/Data: PPS: 50%      Patient Active Problem List   Diagnosis Date Noted  . Empyema of right pleural space ([place]) 08/04/2021  . Malnutrition of moderate degree 07/31/2021  . Atrial fibrillation with rapid ventricular response ([place]) 07/30/2021  . Pleural effusion, right   . Radiation-induced esophageal stricture   . Cough 04/08/2021  . Dysphagia   . Gastroesophageal reflux disease with esophagitis without hemorrhage   . Peptic stricture of esophagus   . Hiatal hernia   . Gastritis and gastroduodenitis   . Insomnia 01/01/2021  . Port-A-Cath in place 12/04/2020  . Encounter for antineoplastic immunotherapy 06/04/2020  . Malignant neoplasm of right upper lobe of lung ([place]) 03/13/2020  . Encounter for antineoplastic chemotherapy 03/13/2020  . Goals of care, counseling/discussion 03/13/2020  . Lung mass 03/11/2020  . Gastroesophageal reflux disease without esophagitis 02/15/2018  . Type 2 diabetes mellitus without complication, without long-term current use of insulin ([place]) 07/07/2017  . Hyperlipidemia 07/07/2017    Palliative Care Assessment & Plan    Assessment/Recommendations/Plan  Continue current plan- patient and family are hopeful for recovery from acute illness so that he can begin new treatment for his cancer His  GOC are to be able to ambulate in his home and to enjoy sitting outside by his pool [facility] consult for outpatient Palliative for symptom management and continued GOC   Code Status: Full code  Prognosis:  Unable to determine  Discharge Planning: Home with Palliative Services  Care plan was discussed with patient's spouse.   Thank you for allowing the Palliative Medicine Team to assist in the care of this patient.  Total time:  38 minutes Prolonged billing: No     Greater than 50%  of this time was spent counseling and coordinating care related to the above assessment and plan.  Mariana Kaufman, AGNP-C Palliative Medicine   Please contact Palliative Medicine Team phone at [phone] for questions and concerns.

## 2021-08-04 NOTE — Progress Notes (Signed)
PROGRESS NOTE    William Farrell  RKY:[id number] DOB: [birth year] DOA: 07/30/2021 PCP: [name], DO   Chief Complain: Fatigue, poor oral intake  Brief Narrative: Patient is a 60 year old male with history of stage IIIa non-small cell lung cancer currently on chemotherapy, hyperlipidemia, diabetes type 2 who presented here with complaints of fatigue, poor oral intake for 2 weeks before admission.  On presentation he was found to be in A. fib with RVR, DKA and also found to have loculated right-sided pleural effusion.  PCCM was consulted on presentation, status post right chest tube placement.  Currently on broad-spectrum antibiotics for loculated right-sided pleural effusion.  Oncology following.  A. fib with RVR has resolved and currently in normal sinus rhythm.  Assessment & Plan:   Active Problems:   Atrial fibrillation with rapid ventricular response (HCC)   Malnutrition of moderate degree   Sepsis secondary to loculated right-sided pleural effusion: Presented with shortness of breath.  Status post chest tube placement on 9/15.  Procalcitonin was elevated.  Still has significant leukocytosis,but  afebrile.  He was on broad-spectrum antibiotics with vancomycin, cefepime, Flagyl.  Pleural fluid culture showing streptococcus intermedius, pansensitive.  Blood cultures have remained negative so far.  Antibiotic has been changed to ceftriaxone.  Sputum culture showing few gram-negative coccobacilli. Cough is better.Chest tube is still having significant output.  Plan to remove the chest tube when the drain is less than 50-100 cc  Stage III non-small cell lung cancer: Follows with Dr. Julien Nordmann, currently on chemo.   Oncology following. Chest CT done on 9.18.22 showed  extensive, diffuse pleural thickening and adjacent interlobular septal thickening with a small, loculated hydropneumothorax, approximately 10% volume,perihilar and suprahilar right lung malignancy with adjacent satellite  nodularity,multiple small pulmonary nodules throughout the lungs consistent with worsening pulmonary metastatic disease.  A. fib with RVR: Likely transient, now resolved after starting on Cardizem drip.  Currently in normal sinus rhythm.  TSH normal.  Echocardiogram showed EF of 60 to 65%, normal left ventricular diastolic parameters.  Not a started on anticoagulation because of transient nature of the A. fib with RVR/likely related to pulmonary issues.  DKA/diabetes type 2: Initially started on insulin drip now transitioned to subcutaneous insulin.  Takes metformin, glipizide, Ozempic at home.  Monitor blood sugars.  Continue current insulin regimen.  Hemoglobin A1c of 6.6.  AKI on CKD stage IIIa: Creatinine was in the range of 2 on presentation.  Baseline creatinine around 1.3.  Currently kidney function at baseline.  Chronic normocytic anemia: Baseline hemoglobin around 8.   No evidence of acute blood loss.  Most likely associated with underlying malignancy, chemotherapy.  Iron studies showed low iron,given IV iron infusion.  Transfused with a unit of PRBC on 08/02/2021.  Hyperlipidemia: On statin  Hypothyroidism: On Synthyroid  GERD: Continue PPI  Hypokalemia/hypomagnesemia: Being monitored and supplemented intermittently  Moderate malnutrition: Secondary to malignancy.  Nutrition following  Debility/deconditioning: PT/OT evaluated the patient and recommend home health on discharge.  Nutrition Problem: Moderate Malnutrition Etiology: chronic illness, cancer and cancer related treatments      DVT prophylaxis:Lovenox Code Status: Full Family Communication: Wife present at bedside Status is: Inpatient  Remains inpatient appropriate because:Inpatient level of care appropriate due to severity of illness  Dispo: The patient is from: Home              Anticipated d/c is to: Home              Patient currently is not medically stable to d/c.  Difficult to place patient  No    Consultants: Oncology,PCCM  Procedures:Chest tube placement  Antimicrobials:  Anti-infectives (From admission, onward)    Start     Dose/Rate Route Frequency Ordered Stop   08/02/21 1800  cefTRIAXone (ROCEPHIN) 2 g in sodium chloride 0.9 % 100 mL IVPB        2 g 200 mL/hr over 30 Minutes Intravenous Every 24 hours 08/02/21 0947     08/02/21 1100  vancomycin (VANCOREADY) IVPB 1250 mg/250 mL  Status:  Discontinued        1,250 mg 166.7 mL/hr over 90 Minutes Intravenous Every 12 hours 08/02/21 0726 08/02/21 0910   08/01/21 1100  vancomycin (VANCOCIN) IVPB 1000 mg/200 mL premix  Status:  Discontinued        1,000 mg 200 mL/hr over 60 Minutes Intravenous Every 12 hours 08/01/21 0951 08/02/21 0726   07/31/21 1800  vancomycin (VANCOREADY) IVPB 1250 mg/250 mL  Status:  Discontinued        1,250 mg 166.7 mL/hr over 90 Minutes Intravenous Every 24 hours 07/30/21 1911 07/31/21 0845   07/31/21 1800  vancomycin (VANCOREADY) IVPB 1500 mg/300 mL  Status:  Discontinued        1,500 mg 150 mL/hr over 120 Minutes Intravenous Every 24 hours 07/31/21 0845 08/01/21 0951   07/31/21 0945  ceFEPIme (MAXIPIME) 2 g in sodium chloride 0.9 % 100 mL IVPB  Status:  Discontinued        2 g 200 mL/hr over 30 Minutes Intravenous Every 8 hours 07/31/21 0845 08/02/21 0948   07/30/21 2000  metroNIDAZOLE (FLAGYL) IVPB 500 mg  Status:  Discontinued        500 mg 100 mL/hr over 60 Minutes Intravenous Every 12 hours 07/30/21 1738 08/03/21 1019   07/30/21 1830  vancomycin (VANCOREADY) IVPB 1750 mg/350 mL        1,750 mg 175 mL/hr over 120 Minutes Intravenous  Once 07/30/21 1738 07/30/21 1846   07/30/21 1800  ceFEPIme (MAXIPIME) 2 g in sodium chloride 0.9 % 100 mL IVPB  Status:  Discontinued        2 g 200 mL/hr over 30 Minutes Intravenous Every 12 hours 07/30/21 1723 07/31/21 0845       Subjective: Patient seen and examined at the bedside this morning.  Wife at the bedside.  Cough and chest pain, back pain  is better but he developed some neck discomfort.  Denies any new complaints.  Objective: Vitals:   08/04/21 0300 08/04/21 0400 08/04/21 0500 08/04/21 0630  BP: 138/65 (!) 110/54 126/65 133/71  Pulse: (!) 106 (!) 106 (!) 108 (!) 108  Resp: (!) 4 12 20 15   Temp: 98.2 F (36.8 C)     TempSrc: Oral     SpO2: 93% 97% 93% 96%  Weight:      Height:        Intake/Output Summary (Last 24 hours) at 08/04/2021 0737 Last data filed at 08/04/2021 0600 Gross per 24 hour  Intake 986.4 ml  Output 905 ml  Net 81.4 ml   Filed Weights   07/30/21 1101 07/30/21 1133  Weight: 95.4 kg 88.9 kg    Examination: General exam: Chronically ill looking, not in distress HEENT: PERRL Respiratory system:  no wheezes or crackles, right-sided chest tube Cardiovascular system: S1 & S2 heard, RRR.  Chemo-Port on the left chest Gastrointestinal system: Abdomen is nondistended, soft and nontender. Central nervous system: Alert and oriented Extremities: No edema, no clubbing ,no cyanosis Skin: No rashes, no  ulcers,no icterus    Data Reviewed: I have personally reviewed following labs and imaging studies  CBC: Recent Labs  Lab 07/30/21 0927 07/30/21 2219 07/31/21 0232 07/31/21 0900 08/01/21 0445 08/02/21 0522 08/03/21 0746 08/04/21 0542  WBC 44.5*   < > 30.7*  --  19.4* 17.6* 25.8* 24.4*  NEUTROABS 41.6*  --   --   --   --   --   --  21.7*  HGB 9.2*   < > 7.5* 7.0* 7.3* 7.0* 8.8* 9.1*  HCT 28.3*   < > 23.4* 22.5* 24.6* 22.9* 28.4* 30.0*  MCV 87.6   < > 88.6  --  93.9 94.6 91.3 92.0  PLT 530*   < > 380  --  343 287 366 374   < > = values in this interval not displayed.   Basic Metabolic Panel: Recent Labs  Lab 07/31/21 0800 08/01/21 0445 08/02/21 0522 08/03/21 0559 08/03/21 0746 08/04/21 0542  NA 138 143 141  --  138 137  K 4.3 4.4 3.4*  --  4.3 4.5  CL 106 113* 116*  --  109 106  CO2 21* 20* 19*  --  21* 23  GLUCOSE 173* 187* 132*  --  152* 137*  BUN 62* 41* 23*  --  19 16  CREATININE  1.23 1.06 0.66  --  0.83 0.78  CALCIUM 9.4 9.5 7.7*  --  9.2 9.0  MG  --  2.1 1.6* 1.9  --  1.7   GFR: Estimated Creatinine Clearance: 107.8 mL/min (by C-G formula based on SCr of 0.78 mg/dL). Liver Function Tests: Recent Labs  Lab 07/30/21 0927 07/30/21 1822 07/31/21 0232  AST 20  --  14*  ALT 16  --  14  ALKPHOS 216*  --  139*  BILITOT 0.4  --  0.4  PROT 8.0 7.4 7.0  ALBUMIN 1.9*  --  2.1*   No results for input(s): LIPASE, AMYLASE in the last 168 hours. No results for input(s): AMMONIA in the last 168 hours. Coagulation Profile: Recent Labs  Lab 07/31/21 0232  INR 1.7*   Cardiac Enzymes: No results for input(s): CKTOTAL, CKMB, CKMBINDEX, TROPONINI in the last 168 hours. BNP (last 3 results) No results for input(s): PROBNP in the last 8760 hours. HbA1C: No results for input(s): HGBA1C in the last 72 hours.  CBG: Recent Labs  Lab 08/03/21 1241 08/03/21 1636 08/03/21 1936 08/03/21 2209 08/04/21 0732  GLUCAP 216* 144* 130* 138* 132*   Lipid Profile: No results for input(s): CHOL, HDL, LDLCALC, TRIG, CHOLHDL, LDLDIRECT in the last 72 hours. Thyroid Function Tests: No results for input(s): TSH, T4TOTAL, FREET4, T3FREE, THYROIDAB in the last 72 hours.  Anemia Panel: No results for input(s): VITAMINB12, FOLATE, FERRITIN, TIBC, IRON, RETICCTPCT in the last 72 hours.  Sepsis Labs: Recent Labs  Lab 07/30/21 2115 07/30/21 2235 07/31/21 0232  PROCALCITON  --   --  3.21  LATICACIDVEN 1.2 2.1*  --     Recent Results (from the past 240 hour(s))  Resp Panel by RT-PCR (Flu A&B, Covid) Nasopharyngeal Swab     Status: None   Collection Time: 07/30/21 11:19 AM   Specimen: Nasopharyngeal Swab; Nasopharyngeal(NP) swabs in vial transport medium  Result Value Ref Range Status   SARS Coronavirus 2 by RT PCR NEGATIVE NEGATIVE Final    Comment: (NOTE) SARS-CoV-2 target nucleic acids are NOT DETECTED.  The SARS-CoV-2 RNA is generally detectable in upper  respiratory specimens during the acute phase of infection. The  lowest concentration of SARS-CoV-2 viral copies this assay can detect is 138 copies/mL. A negative result does not preclude SARS-Cov-2 infection and should not be used as the sole basis for treatment or other patient management decisions. A negative result may occur with  improper specimen collection/handling, submission of specimen other than nasopharyngeal swab, presence of viral mutation(s) within the areas targeted by this assay, and inadequate number of viral copies(<138 copies/mL). A negative result must be combined with clinical observations, patient history, and epidemiological information. The expected result is Negative.  Fact Sheet for Patients:  EntrepreneurPulse.com.au  Fact Sheet for Healthcare Providers:  IncredibleEmployment.be  This test is no t yet approved or cleared by the [place] FDA and  has been authorized for detection and/or diagnosis of SARS-CoV-2 by FDA under an Emergency Use Authorization (EUA). This EUA will remain  in effect (meaning this test can be used) for the duration of the COVID-19 declaration under Section 564(b)(1) of the Act, 21 U.S.C.section 360bbb-3(b)(1), unless the authorization is terminated  or revoked sooner.       Influenza A by PCR NEGATIVE NEGATIVE Final   Influenza B by PCR NEGATIVE NEGATIVE Final    Comment: (NOTE) The Xpert Xpress SARS-CoV-2/FLU/RSV plus assay is intended as an aid in the diagnosis of influenza from Nasopharyngeal swab specimens and should not be used as a sole basis for treatment. Nasal washings and aspirates are unacceptable for Xpert Xpress SARS-CoV-2/FLU/RSV testing.  Fact Sheet for Patients: EntrepreneurPulse.com.au  Fact Sheet for Healthcare Providers: IncredibleEmployment.be  This test is not yet approved or cleared by the [place] FDA and has been  authorized for detection and/or diagnosis of SARS-CoV-2 by FDA under an Emergency Use Authorization (EUA). This EUA will remain in effect (meaning this test can be used) for the duration of the COVID-19 declaration under Section 564(b)(1) of the Act, 21 U.S.C. section 360bbb-3(b)(1), unless the authorization is terminated or revoked.  Performed at [facility], [place] [address] [place], [place] [address]   Body fluid culture w Gram Stain     Status: None   Collection Time: 07/30/21  5:35 PM   Specimen: Pleura; Body Fluid  Result Value Ref Range Status   Specimen Description   Final    PLEURAL RIGHT Performed at [place] [address], [place], [place] [address]    Special Requests   Final    NONE Performed at [facility], [place] [address] [place], [place] [address]    Gram Stain   Final    ABUNDANT WBC PRESENT,BOTH PMN AND MONONUCLEAR FEW GRAM POSITIVE COCCI Performed at [facility], [place] [address] [place], [place] [address]    Culture ABUNDANT STREPTOCOCCUS INTERMEDIUS  Final   Report Status 08/03/2021 FINAL  Final   Organism ID, Bacteria STREPTOCOCCUS INTERMEDIUS  Final      Susceptibility   Streptococcus intermedius - MIC*    PENICILLIN <=0.06 SENSITIVE Sensitive     CEFTRIAXONE 0.25 SENSITIVE Sensitive     ERYTHROMYCIN <=0.12 SENSITIVE Sensitive     LEVOFLOXACIN 0.5 SENSITIVE Sensitive     VANCOMYCIN 0.5 SENSITIVE Sensitive     * ABUNDANT STREPTOCOCCUS INTERMEDIUS  Culture, blood (routine x 2)     Status: None (Preliminary result)   Collection Time: 07/30/21 10:35 PM   Specimen: Right Antecubital; Blood  Result Value Ref Range Status   Specimen Description   Final    RIGHT ANTECUBITAL Performed at [facility]  [address] [place], [place] [address]    Special Requests   Final    BOTTLES DRAWN AEROBIC AND ANAEROBIC Blood Culture adequate volume Performed at [place] [address] [place], [place] [address]    Culture   Final    NO GROWTH 3 DAYS Performed at [facility], [place] [address], [place], [place] [address]    Report Status PENDING  Incomplete  Culture, blood (routine x 2)     Status: None (Preliminary result)   Collection Time: 07/30/21 10:35 PM   Specimen: Left Antecubital; Blood  Result Value Ref Range Status   Specimen Description   Final    LEFT ANTECUBITAL Performed at [place] [address] [place], [place] [address]    Special Requests   Final    BOTTLES DRAWN AEROBIC AND ANAEROBIC Blood Culture adequate volume Performed at [place] [address] [place], [place] [address]    Culture   Final    NO GROWTH 3 DAYS Performed at [facility], [place] [address], [place], [place] [address]    Report Status PENDING  Incomplete  MRSA Next Gen by PCR, Nasal     Status: None   Collection Time: 07/30/21 11:07 PM   Specimen: Nasal Mucosa; Nasal Swab  Result Value Ref Range Status   MRSA by PCR Next Gen NOT DETECTED NOT DETECTED Final    Comment: (NOTE) The GeneXpert MRSA Assay (FDA approved for NASAL specimens only), is one component of a comprehensive MRSA colonization surveillance program. It is not intended to diagnose MRSA infection nor to guide or monitor treatment for MRSA infections. Test performance is not FDA approved in patients less than 19 years old. Performed at [facility], [place] [address] [place], [place] [address]   Urine Culture     Status: Abnormal   Collection Time: 07/31/21  2:20 PM   Specimen: Urine, Clean Catch  Result Value Ref Range Status   Specimen Description   Final    URINE, CLEAN CATCH Performed at [facility], [place] [address], [place], [place] [address]    Special Requests   Final    NONE Performed at [facility], [place] [address] [place], [place] [address]     Culture (A)  Final    <10,000 COLONIES/mL INSIGNIFICANT GROWTH Performed at [place] [address] [place], [place] [address]    Report Status 08/02/2021 FINAL  Final  Expectorated Sputum Assessment w Gram Stain, Rflx to Resp Cult     Status: None   Collection Time: 08/02/21 10:15 AM   Specimen: Expectorated Sputum  Result Value Ref Range Status   Specimen Description EXPECTORATED SPUTUM  Final   Special Requests NONE  Final   Sputum evaluation   Final    THIS SPECIMEN IS ACCEPTABLE FOR SPUTUM CULTURE Performed at [facility], [place] [address], [place], [place] [address]    Report Status 08/02/2021 FINAL  Final  Culture, Respiratory w Gram Stain     Status: None (Preliminary result)   Collection Time: 08/02/21 10:15 AM  Result Value Ref Range Status   Specimen Description   Final    EXPECTORATED SPUTUM Performed at [place] [address], [place], [place] [address]    Special Requests   Final    NONE Reflexed from [phone] Performed at [facility], [place] [address] [place], [place] [address]  Gram Stain   Final    FEW WBC PRESENT, PREDOMINANTLY PMN FEW GRAM NEGATIVE COCCOBACILLI    Culture   Final    CULTURE REINCUBATED FOR BETTER GROWTH Performed at [facility], [place] [address] [place], [place] [address]    Report Status PENDING  Incomplete         Radiology Studies: CT CHEST W CONTRAST  Result Date: 08/02/2021 CLINICAL DATA:  Empyema, chest tube placement, shortness of breath, history of lung cancer EXAM: CT CHEST WITH CONTRAST TECHNIQUE: Multidetector CT imaging of the chest was performed during intravenous contrast administration. CONTRAST:  69mL OMNIPAQUE IOHEXOL 350 MG/ML SOLN COMPARISON:  06/08/2021 FINDINGS: Cardiovascular: Left chest port catheter. Normal heart size. No pericardial effusion. Mediastinum/Nodes: No significant change in extensive soft tissue about the right hilum  with matted appearing pretracheal lymph nodes (series 2, image 59, 49). Thyroid gland, trachea, and esophagus demonstrate no significant findings. Lungs/Pleura: There has been interval placement of a pigtail chest tube about the posterior, inferior right lung base (series 7, image 75). There is extensive, diffuse pleural thickening and adjacent interlobular septal thickening with a small, loculated hydropneumothorax, approximately 10% volume. Similar post treatment appearance of underlying perihilar and suprahilar right lung malignancy with adjacent satellite nodularity. Multiple small pulmonary nodules throughout the lungs are new and enlarged, particularly in the left lung, for example a nodule of the anterior left upper lobe measuring 0.7 cm previously no greater than 0.3 cm (series 5, image 47). New, trace left pleural effusion. Upper Abdomen: No acute abnormality. Small volume perihepatic ascites. Musculoskeletal: No chest wall mass or suspicious bone lesions identified. IMPRESSION: 1. There has been interval placement of a pigtail chest tube about the posterior, inferior right lung base. 2. There is extensive, diffuse pleural thickening and adjacent interlobular septal thickening with a small, loculated hydropneumothorax, approximately 10% volume. This trapped lung appearance may be secondary to pleural metastatic disease and or empyema. 3. New, trace left pleural effusion. 4. Similar post treatment appearance of underlying perihilar and suprahilar right lung malignancy with adjacent satellite nodularity. 5. Multiple small pulmonary nodules throughout the lungs are new and enlarged, consistent with worsening pulmonary metastatic disease. 6. Small volume perihepatic ascites. Electronically Signed   By: Eddie Candle M.D.   On: 08/02/2021 13:44        Scheduled Meds:  aspirin EC  81 mg Oral QHS   atorvastatin  40 mg Oral QHS   Chlorhexidine Gluconate Cloth  6 each Topical Daily    chlorpheniramine-HYDROcodone  5 mL Oral Q12H   enoxaparin (LOVENOX) injection  40 mg Subcutaneous Q24H   feeding supplement  1 Container Oral TID BM   ferrous sulfate  325 mg Oral Q breakfast   guaiFENesin  600 mg Oral BID   insulin aspart  0-15 Units Subcutaneous TID WC   insulin aspart  0-5 Units Subcutaneous QHS   insulin glargine-yfgn  20 Units Subcutaneous Daily   levothyroxine  75 mcg Oral Q0600   lidocaine  1 patch Transdermal Daily   mouth rinse  15 mL Mouth Rinse BID   multivitamin with minerals  1 tablet Oral Daily   pantoprazole  40 mg Oral Daily   revefenacin  175 mcg Nebulization Daily   senna-docusate  2 tablet Oral QHS   sodium chloride flush  10 mL Other Q8H   sodium chloride flush  10-40 mL Intracatheter Q12H   Continuous Infusions:  cefTRIAXone (ROCEPHIN)  IV Stopped (08/03/21 1817)     LOS: 5  days    Time spent:35 mins, More than 50% of that time was spent in counseling and/or coordination of care.      Shelly Coss, MD [facility] P9/20/2022, 7:37 AM

## 2021-08-04 NOTE — Plan of Care (Signed)

## 2021-08-04 NOTE — Progress Notes (Signed)
NAME:  William Farrell, MRN:  [id number], DOB:  07-14-1961, LOS: 5 ADMISSION DATE:  07/30/2021, CONSULTATION DATE:  9/15 REFERRING MD:  Dr Marylyn Ishihara, CHIEF COMPLAINT:  SOB/weakness   History of Present Illness:  60 year old male with prior history of Stage IIIa NSCLC (SCC) RUL diagnosed 02/2020 (RUL/suprahilar mass and R paratracheal and subcarinal LAD) s/p chemo XRT including keytruda, consolidation IT with durvalumab, SBRT to RUL nodules followed by Dr. Julien Nordmann. He is receiving neulasta support.   Presented to Dr. Julien Nordmann for follow-up, reportedly not felt well for 1-2 weeks, 15 lb weight loss since his prior visit, pain under right breast, worse with cough and progressive dyspnea on exertion even on his home O2.  He has had productive cough, orthopnea that has worsened over the last week. He has borrowed his wife's oxygen. A little hemoptysis, but no nausea, vomiting, or diarrhea.  No fever/chills.  He does acknowledge some trouble with aspiration of solids/liquids. Was due to start cycle #12 today.  Pertinent  Medical History  NSCLC stage IIIA (dx 02/2020)  Significant Hospital Events: Including procedures, antibiotic start and stop dates in addition to other pertinent events   Chest tube placement 07/30/2021 Cytology pleural fluid >> no malignant cells Pleural fluid cx > pan-sensitive Strep intermedius  Interim History / Subjective:   No new events overnight He is having some back and neck cramping, musculoskeletal discomfort Chest tube output 130 cc last 24 hours No fevers Metronidazole discontinued 9/19, remains on ceftriaxone   Objective   Blood pressure 133/71, pulse (!) 108, temperature 98.2 F (36.8 C), temperature source Oral, resp. rate 15, height 6' (1.829 m), weight 88.9 kg, SpO2 96 %.    FiO2 (%):  [28 %] 28 %   Intake/Output Summary (Last 24 hours) at 08/04/2021 0732 Last data filed at 08/04/2021 0600 Gross per 24 hour  Intake 986.4 ml  Output 905 ml  Net 81.4 ml    Filed Weights   07/30/21 1101 07/30/21 1133  Weight: 95.4 kg 88.9 kg    Examination: General: Comfortable, laying in bed, no evidence of respiratory distress HENT: Oropharynx clear, no JVP elevation, no stridor Lungs: Posterior chest tube site is clean, no erythema, continues to drain with no air leak.  Lungs somewhat coarse, inspiratory crackles on the right Cardiovascular: Regular, distant, no murmur Abdomen: Nondistended, positive bowel sounds Extremities: Trace pretibial edema Neuro: Awake, alert, answers questions, oriented, nonfocal  Resolved Hospital Problem list     Assessment & Plan:   Right strep intermedius empyema with chest tube in place 9/15.  Continues to have yellow purulent material draining from chest tube -Saline flushes added to ensure chest tube patency on 9/19 -Continue ceftriaxone, day 5, metronidazole stopped on 9/19 -Pain control chest tube in place -Adequate drainage based on recent CT chest, showing some evidence of trapped lung but no residual pockets of pleural effusion. -Can consider removal pigtail catheter once output down to 50-100 cc per 24 hours -We will begin to push PT/OT, out of bed  Non-small cell lung cancer, currently undergoing therapy.  Concern for possible progressive disease most recent CT. pleural fluid cytology negative -Current therapy on hold given acute infection.  Appreciate oncology management  Atrial fibrillation with RVR, resolved.  Was transient in the setting of acute infection -Off therapeutic anticoagulation -On prophylactic enoxaparin  Diabetes mellitus type 2 -Sliding scale insulin per primary service   Best Practice (right click and "Reselect all SmartList Selections" daily)   Diet/type: Regular consistency (see orders)  DVT prophylaxis:  GI prophylaxis: PPI Lines: N/A Foley:  N/A Code Status:  full code Last date of multidisciplinary goals of care discussion [Per primary] Family: Updated the patient and  his wife at bedside on 9/20 Disposition >> he should be able to move to regular floor bed for continued treatment and monitoring of his chest tube, chest tube output  Labs   CBC: Recent Labs  Lab 07/30/21 0927 07/30/21 2219 07/31/21 0232 07/31/21 0900 08/01/21 0445 08/02/21 0522 08/03/21 0746 08/04/21 0542  WBC 44.5*   < > 30.7*  --  19.4* 17.6* 25.8* 24.4*  NEUTROABS 41.6*  --   --   --   --   --   --  21.7*  HGB 9.2*   < > 7.5* 7.0* 7.3* 7.0* 8.8* 9.1*  HCT 28.3*   < > 23.4* 22.5* 24.6* 22.9* 28.4* 30.0*  MCV 87.6   < > 88.6  --  93.9 94.6 91.3 92.0  PLT 530*   < > 380  --  343 287 366 374   < > = values in this interval not displayed.    Basic Metabolic Panel: Recent Labs  Lab 07/31/21 0800 08/01/21 0445 08/02/21 0522 08/03/21 0559 08/03/21 0746 08/04/21 0542  NA 138 143 141  --  138 137  K 4.3 4.4 3.4*  --  4.3 4.5  CL 106 113* 116*  --  109 106  CO2 21* 20* 19*  --  21* 23  GLUCOSE 173* 187* 132*  --  152* 137*  BUN 62* 41* 23*  --  19 16  CREATININE 1.23 1.06 0.66  --  0.83 0.78  CALCIUM 9.4 9.5 7.7*  --  9.2 9.0  MG  --  2.1 1.6* 1.9  --  1.7   GFR: Estimated Creatinine Clearance: 107.8 mL/min (by C-G formula based on SCr of 0.78 mg/dL). Recent Labs  Lab 07/30/21 2115 07/30/21 2219 07/30/21 2235 07/31/21 0232 08/01/21 0445 08/02/21 0522 08/03/21 0746 08/04/21 0542  PROCALCITON  --   --   --  3.21  --   --   --   --   WBC  --    < >  --  30.7* 19.4* 17.6* 25.8* 24.4*  LATICACIDVEN 1.2  --  2.1*  --   --   --   --   --    < > = values in this interval not displayed.    Liver Function Tests: Recent Labs  Lab 07/30/21 0927 07/30/21 1822 07/31/21 0232  AST 20  --  14*  ALT 16  --  14  ALKPHOS 216*  --  139*  BILITOT 0.4  --  0.4  PROT 8.0 7.4 7.0  ALBUMIN 1.9*  --  2.1*   No results for input(s): LIPASE, AMYLASE in the last 168 hours. No results for input(s): AMMONIA in the last 168 hours.  ABG    Component Value Date/Time   HCO3 18.6  (L) 07/30/2021 2235   ACIDBASEDEF 5.5 (H) 07/30/2021 2235   O2SAT 86.8 07/30/2021 2235     Coagulation Profile: Recent Labs  Lab 07/31/21 0232  INR 1.7*      CBG: Recent Labs  Lab 08/03/21 0744 08/03/21 1241 08/03/21 1636 08/03/21 1936 08/03/21 2209  GLUCAP 139* 216* 144* 130* 138*     Baltazar Apo, MD, PhD 08/04/2021, 7:32 AM [facility] [phone] or if no answer before 7:00PM call [phone] For any issues after 7:00PM please call eLink [phone]

## 2021-08-05 ENCOUNTER — Other Ambulatory Visit: Payer: BC Managed Care – PPO

## 2021-08-05 DIAGNOSIS — Z515 Encounter for palliative care: Secondary | ICD-10-CM | POA: Diagnosis not present

## 2021-08-05 DIAGNOSIS — C3411 Malignant neoplasm of upper lobe, right bronchus or lung: Secondary | ICD-10-CM

## 2021-08-05 DIAGNOSIS — Z7189 Other specified counseling: Secondary | ICD-10-CM

## 2021-08-05 DIAGNOSIS — J869 Pyothorax without fistula: Secondary | ICD-10-CM | POA: Diagnosis not present

## 2021-08-05 LAB — CULTURE, BLOOD (ROUTINE X 2)
Culture: NO GROWTH
Culture: NO GROWTH
Special Requests: ADEQUATE
Special Requests: ADEQUATE

## 2021-08-05 LAB — GLUCOSE, CAPILLARY
Glucose-Capillary: 140 mg/dL — ABNORMAL HIGH (ref 70–99)
Glucose-Capillary: 171 mg/dL — ABNORMAL HIGH (ref 70–99)
Glucose-Capillary: 126 mg/dL — ABNORMAL HIGH (ref 70–99)
Glucose-Capillary: 115 mg/dL — ABNORMAL HIGH (ref 70–99)

## 2021-08-05 LAB — CBC WITH DIFFERENTIAL/PLATELET
Abs Immature Granulocytes: 0.16 10*3/uL — ABNORMAL HIGH (ref 0.00–0.07)
Basophils Absolute: 0.1 10*3/uL (ref 0.0–0.1)
Basophils Relative: 0 %
Eosinophils Absolute: 0.2 10*3/uL (ref 0.0–0.5)
Eosinophils Relative: 1 %
HCT: 31.7 % — ABNORMAL LOW (ref 39.0–52.0)
Hemoglobin: 9.8 g/dL — ABNORMAL LOW (ref 13.0–17.0)
Immature Granulocytes: 1 %
Lymphocytes Relative: 3 %
Lymphs Abs: 0.8 10*3/uL (ref 0.7–4.0)
MCH: 28.7 pg (ref 26.0–34.0)
MCHC: 30.9 g/dL (ref 30.0–36.0)
MCV: 93 fL (ref 80.0–100.0)
Monocytes Absolute: 1.6 10*3/uL — ABNORMAL HIGH (ref 0.1–1.0)
Monocytes Relative: 6 %
Neutro Abs: 21.7 10*3/uL — ABNORMAL HIGH (ref 1.7–7.7)
Neutrophils Relative %: 89 %
Platelets: 385 10*3/uL (ref 150–400)
RBC: 3.41 MIL/uL — ABNORMAL LOW (ref 4.22–5.81)
RDW: 20.3 % — ABNORMAL HIGH (ref 11.5–15.5)
WBC: 24.5 10*3/uL — ABNORMAL HIGH (ref 4.0–10.5)
nRBC: 0 % (ref 0.0–0.2)

## 2021-08-05 MED ORDER — MORPHINE SULFATE (PF) 2 MG/ML IV SOLN
2.0000 mg | INTRAVENOUS | Status: DC | PRN
Start: 2021-08-05 — End: 2021-08-05
  Administered 2021-08-05: 2 mg via INTRAVENOUS
  Filled 2021-08-05: qty 1

## 2021-08-05 MED ORDER — SODIUM CHLORIDE 0.9 % IV SOLN
2.0000 g | INTRAVENOUS | Status: DC
  Administered 2021-08-05 – 2021-08-12 (×7): 2 g via INTRAVENOUS
  Filled 2021-08-05 (×8): qty 20

## 2021-08-05 NOTE — TOC Progression Note (Signed)
Transition of Care [facility]) - Progression Note    Patient Details  Name: William Farrell MRN: [id number] Date of Birth: 05-11-1961  Transition of Care [facility] CM/SW Contact  Leeroy Cha, RN Phone Number: 08/05/2021, 8:02 AM  Clinical Narrative:    Patient is dealing mostly with mobility issues.  Physical therapy has suggested home pt.  Will follow for needs and orders.   Expected Discharge Plan: [place] Barriers to Discharge: Continued Medical Work up  Expected Discharge Plan and Services Expected Discharge Plan: [place]   Discharge Planning Services: CM Consult   Living arrangements for the past 2 months: Single Family Home                                       Social Determinants of Health (SDOH) Interventions    Readmission Risk Interventions No flowsheet data found.

## 2021-08-05 NOTE — Progress Notes (Signed)
PCCM Brief Note  Reviewed chart, VS and I/O Afebrile  Chest tube still draining, 150cc last 24h Today is day 5 abx, on ceftriaxone alone currently  Vitals:   08/05/21 0400 08/05/21 0628 08/05/21 0800 08/05/21 0815  BP: 129/89     Pulse: (!) 103  (!) 102   Resp: (!) 6  18   Temp:  97.6 F (36.4 C)    TempSrc:  Axillary    SpO2: 92%  92% 93%  Weight:      Height:      Exam is unchanged  R pleural fluid > S intermedius Blood cx's >> NGTD Resp cx >> normal flora  Recommend: - continue ceftriaxone, at least 7 days and possibly extend course depending on chest tube output - continue chest tube to suction, consider removal when output 50-100cc/24h, would prefer output to be < 50cc/24h - could go out of the SDU from my standpoint.   Will continue to follow with you.    Baltazar Apo, MD, PhD 08/05/2021, 8:22 AM [facility] [phone] or if no answer before 7:00PM call [phone] For any issues after 7:00PM please call eLink [phone]

## 2021-08-05 NOTE — Progress Notes (Signed)
Occupational Therapy Treatment Patient Details Name: William Farrell MRN: [id number] DOB: Feb 12, 1961 Today's Date: 08/05/2021   History of present illness Pt is 60 year old with history of stage IIIa non-small cell lung cancer on chemo who presents to the hospital for fatigue and poor oral intake for the past 2 weeks on 07/30/21.  He was found to be in atrial fibrillation with RVR, DKA and had loculated pleural effusion. Chest tube was inserted 07/30/2021. Pt with other hx of HLD and DM2   OT comments  Patient supine in bed and premedicated when therapist entered room. He is resigned to get out of the bed but not overly motivated reporting "I just got comfortable." Patient is supervision to transfer to side of bed and min guard to stand and pivot to recliner. Assistance required for lines and management of chest tube box. Therapist provided patient with orange theraband but patient not interested in performing arm exercises at this time. Treatment limited. Will hope patient will tolerate more activity once chest tube removed.    Recommendations for follow up therapy are one component of a multi-disciplinary discharge planning process, led by the attending physician.  Recommendations may be updated based on patient status, additional functional criteria and insurance authorization.    Follow Up Recommendations  No OT follow up    Equipment Recommendations  Other (comment) (TBD)    Recommendations for Other Services      Precautions / Restrictions Precautions Precautions: Fall Precaution Comments: R CT to wall suction Restrictions Weight Bearing Restrictions: No       Mobility Bed Mobility   Bed Mobility: Supine to Sit     Supine to sit: Supervision;HOB elevated     General bed mobility comments: supervision to transfer to edge of bed - assistance for lines/leads/CT    Transfers Overall transfer level: Needs assistance Equipment used: None Transfers: Sit to/from  [facility] Sit to Stand: Min guard Stand pivot transfers: Min guard       General transfer comment: Min guard to stand and transfer to recliner without device.    Balance Overall balance assessment: Mild deficits observed, not formally tested                                         ADL either performed or assessed with clinical judgement   ADL                                               Vision Patient Visual Report: No change from baseline     Perception     Praxis      Cognition Arousal/Alertness: Awake/alert Behavior During Therapy: Flat affect Overall Cognitive Status: Within Functional Limits for tasks assessed                                          Exercises     Shoulder Instructions       General Comments      Pertinent Vitals/ Pain       Pain Assessment: Faces Faces Pain Scale: Hurts a little bit Pain Location: CT site Pain Descriptors / Indicators: Grimacing Pain Intervention(s): Premedicated before session  Home Living  Prior Functioning/Environment              Frequency  Min 2X/week        Progress Toward Goals  OT Goals(current goals can now be found in the care plan section)  Progress towards OT goals: Progressing toward goals  Acute Rehab OT Goals Patient Stated Goal: return home; walk more; get chest tube out OT Goal Formulation: With patient Time For Goal Achievement: 08/14/21 Potential to Achieve Goals: [place] Discharge plan remains appropriate    Co-evaluation                 AM-PAC OT "6 Clicks" Daily Activity     Outcome Measure   Help from another person eating meals?: None Help from another person taking care of personal grooming?: A Little Help from another person toileting, which includes using toliet, bedpan, or urinal?: A Little Help from another person bathing  (including washing, rinsing, drying)?: A Lot Help from another person to put on and taking off regular upper body clothing?: A Little Help from another person to put on and taking off regular lower body clothing?: A Lot 6 Click Score: 17    End of Session    OT Visit Diagnosis: Muscle weakness (generalized) (M62.81);Pain   Activity Tolerance Patient limited by pain;Patient limited by fatigue   Patient Left in chair;with call bell/phone within reach;with family/visitor present   Nurse Communication Mobility status        Time: 3212-2482 OT Time Calculation (min): 13 min  Charges: OT General Charges $OT Visit: 1 Visit OT Treatments $Therapeutic Activity: 8-22 mins  Derl Barrow, OTR/L [place]  Office [phone] Pager: [place] 08/05/2021, 3:07 PM

## 2021-08-05 NOTE — Progress Notes (Addendum)
PROGRESS NOTE    William Farrell  KCL:[id number] DOB: Oct 14, 1961 DOA: 07/30/2021 PCP: [name], DO   Chief Complain: Fatigue, poor oral intake  Brief Narrative: Patient is a 60 year old male with history of stage IIIa non-small cell lung cancer currently on chemotherapy, hyperlipidemia, diabetes type 2 who presented here with complaints of fatigue, poor oral intake for 2 weeks before admission.  On presentation he was found to be in A. fib with RVR, DKA and also found to have loculated right-sided pleural effusion.  PCCM was consulted on presentation, status post right chest tube placement.  Currently on broad-spectrum antibiotics for loculated right-sided pleural effusion.  Oncology following.  A. fib with RVR has resolved and currently in normal sinus rhythm.  Assessment & Plan:   Principal Problem:   Empyema of right pleural space (HCC) Active Problems:   Type 2 diabetes mellitus without complication, without long-term current use of insulin (HCC)   Malignant neoplasm of right upper lobe of lung (HCC)   Atrial fibrillation with rapid ventricular response (HCC)   Malnutrition of moderate degree   Sepsis secondary to loculated right-sided pleural effusion: Presented with shortness of breath.  Status post chest tube placement on 9/15.  Procalcitonin was elevated.  Still has significant leukocytosis,but  afebrile.  He was on broad-spectrum antibiotics with vancomycin, cefepime, Flagyl.  Pleural fluid culture showing streptococcus intermedius, pansensitive.  Blood cultures have remained negative so far.  Antibiotic has been changed to ceftriaxone,day 5.  Sputum culture showing few gram-negative coccobacilli. Cough is better.Chest tube is still having significant output.  Plan to remove the chest tube when the drain is less than 50-100 cc  Stage III non-small cell lung cancer: Follows with Dr. Julien Nordmann, he was being treated with chemo.   Oncology following. Chest CT done on  9.18.22 showed  extensive, diffuse pleural thickening and adjacent interlobular septal thickening with a small, loculated hydropneumothorax, approximately 10% volume,perihilar and suprahilar right lung malignancy with adjacent satellite nodularity,multiple small pulmonary nodules throughout the lungs consistent with worsening pulmonary metastatic disease.  A. fib with RVR: Likely transient, now resolved after starting on Cardizem drip.  Currently in normal sinus rhythm.  TSH normal.  Echocardiogram showed EF of 60 to 65%, normal left ventricular diastolic parameters.  Not a started on anticoagulation because of transient nature of the A. fib with RVR/likely related to pulmonary issues.  DKA/diabetes type 2: Initially started on insulin drip now transitioned to subcutaneous insulin.  Takes metformin, glipizide, Ozempic at home.  Monitor blood sugars.  Continue current insulin regimen.  Hemoglobin A1c of 6.6.  AKI on CKD stage IIIa: Creatinine was in the range of 2 on presentation.  Baseline creatinine around 1.3.  Currently kidney function at baseline.  Chronic normocytic anemia: Baseline hemoglobin around 8.   No evidence of acute blood loss.  Most likely associated with underlying malignancy, chemotherapy.  Iron studies showed low iron,given IV iron infusion.  Transfused with a unit of PRBC on 08/02/2021.  Hyperlipidemia: On statin  Hypothyroidism: On Synthyroid  GERD: Continue PPI  Hypokalemia/hypomagnesemia: Being monitored and supplemented intermittently  Moderate malnutrition: Secondary to malignancy.  Nutrition following  Debility/deconditioning: PT/OT evaluated the patient and recommend home health on discharge.  Nutrition Problem: Moderate Malnutrition Etiology: chronic illness, cancer and cancer related treatments      DVT prophylaxis:Lovenox Code Status: Full Family Communication: Wife present at bedside Status is: Inpatient  Remains inpatient appropriate because:Inpatient  level of care appropriate due to severity of illness  Dispo: The patient is  from: Home              Anticipated d/c is to: Home              Patient currently is not medically stable to d/c.   Difficult to place patient No    Consultants: Oncology,PCCM  Procedures:Chest tube placement  Antimicrobials:  Anti-infectives (From admission, onward)    Start     Dose/Rate Route Frequency Ordered Stop   08/02/21 1800  cefTRIAXone (ROCEPHIN) 2 g in sodium chloride 0.9 % 100 mL IVPB        2 g 200 mL/hr over 30 Minutes Intravenous Every 24 hours 08/02/21 0947     08/02/21 1100  vancomycin (VANCOREADY) IVPB 1250 mg/250 mL  Status:  Discontinued        1,250 mg 166.7 mL/hr over 90 Minutes Intravenous Every 12 hours 08/02/21 0726 08/02/21 0910   08/01/21 1100  vancomycin (VANCOCIN) IVPB 1000 mg/200 mL premix  Status:  Discontinued        1,000 mg 200 mL/hr over 60 Minutes Intravenous Every 12 hours 08/01/21 0951 08/02/21 0726   07/31/21 1800  vancomycin (VANCOREADY) IVPB 1250 mg/250 mL  Status:  Discontinued        1,250 mg 166.7 mL/hr over 90 Minutes Intravenous Every 24 hours 07/30/21 1911 07/31/21 0845   07/31/21 1800  vancomycin (VANCOREADY) IVPB 1500 mg/300 mL  Status:  Discontinued        1,500 mg 150 mL/hr over 120 Minutes Intravenous Every 24 hours 07/31/21 0845 08/01/21 0951   07/31/21 0945  ceFEPIme (MAXIPIME) 2 g in sodium chloride 0.9 % 100 mL IVPB  Status:  Discontinued        2 g 200 mL/hr over 30 Minutes Intravenous Every 8 hours 07/31/21 0845 08/02/21 0948   07/30/21 2000  metroNIDAZOLE (FLAGYL) IVPB 500 mg  Status:  Discontinued        500 mg 100 mL/hr over 60 Minutes Intravenous Every 12 hours 07/30/21 1738 08/03/21 1019   07/30/21 1830  vancomycin (VANCOREADY) IVPB 1750 mg/350 mL        1,750 mg 175 mL/hr over 120 Minutes Intravenous  Once 07/30/21 1738 07/30/21 1846   07/30/21 1800  ceFEPIme (MAXIPIME) 2 g in sodium chloride 0.9 % 100 mL IVPB  Status:  Discontinued         2 g 200 mL/hr over 30 Minutes Intravenous Every 12 hours 07/30/21 1723 07/31/21 0845       Subjective: Patient seen and examined the bedside this morning.  Hemodynamically stable.  On room air.  Complains of pain in the chest while coughing.  Lying in bed, weak ,mostly bedbound  Objective: Vitals:   08/05/21 0300 08/05/21 0345 08/05/21 0400 08/05/21 0628  BP: (!) 126/93  129/89   Pulse: (!) 101  (!) 103   Resp: (!) 6  (!) 6   Temp:  97.6 F (36.4 C)  97.6 F (36.4 C)  TempSrc:  Axillary  Axillary  SpO2: 91%  92%   Weight:      Height:        Intake/Output Summary (Last 24 hours) at 08/05/2021 0736 Last data filed at 08/05/2021 0700 Gross per 24 hour  Intake 236.8 ml  Output 1010 ml  Net -773.2 ml   Filed Weights   07/30/21 1101 07/30/21 1133  Weight: 95.4 kg 88.9 kg    Examination: General exam: Deconditioned, debilitated, chronically ill looking, weak HEENT: PERRL Respiratory system:  no wheezes or crackles,  right-sided chest tube Cardiovascular system: S1 & S2 heard, RRR.  Gastrointestinal system: Abdomen is nondistended, soft and nontender. Central nervous system: Alert and oriented Extremities: No edema, no clubbing ,no cyanosis Skin: No rashes, no ulcers,no icterus    Data Reviewed: I have personally reviewed following labs and imaging studies  CBC: Recent Labs  Lab 07/30/21 0927 07/30/21 2219 08/01/21 0445 08/02/21 0522 08/03/21 0746 08/04/21 0542 08/05/21 0533  WBC 44.5*   < > 19.4* 17.6* 25.8* 24.4* 24.5*  NEUTROABS 41.6*  --   --   --   --  21.7* 21.7*  HGB 9.2*   < > 7.3* 7.0* 8.8* 9.1* 9.8*  HCT 28.3*   < > 24.6* 22.9* 28.4* 30.0* 31.7*  MCV 87.6   < > 93.9 94.6 91.3 92.0 93.0  PLT 530*   < > 343 287 366 374 385   < > = values in this interval not displayed.   Basic Metabolic Panel: Recent Labs  Lab 07/31/21 0800 08/01/21 0445 08/02/21 0522 08/03/21 0559 08/03/21 0746 08/04/21 0542  NA 138 143 141  --  138 137  K 4.3 4.4 3.4*   --  4.3 4.5  CL 106 113* 116*  --  109 106  CO2 21* 20* 19*  --  21* 23  GLUCOSE 173* 187* 132*  --  152* 137*  BUN 62* 41* 23*  --  19 16  CREATININE 1.23 1.06 0.66  --  0.83 0.78  CALCIUM 9.4 9.5 7.7*  --  9.2 9.0  MG  --  2.1 1.6* 1.9  --  1.7   GFR: Estimated Creatinine Clearance: 107.8 mL/min (by C-G formula based on SCr of 0.78 mg/dL). Liver Function Tests: Recent Labs  Lab 07/30/21 0927 07/30/21 1822 07/31/21 0232  AST 20  --  14*  ALT 16  --  14  ALKPHOS 216*  --  139*  BILITOT 0.4  --  0.4  PROT 8.0 7.4 7.0  ALBUMIN 1.9*  --  2.1*   No results for input(s): LIPASE, AMYLASE in the last 168 hours. No results for input(s): AMMONIA in the last 168 hours. Coagulation Profile: Recent Labs  Lab 07/31/21 0232  INR 1.7*   Cardiac Enzymes: No results for input(s): CKTOTAL, CKMB, CKMBINDEX, TROPONINI in the last 168 hours. BNP (last 3 results) No results for input(s): PROBNP in the last 8760 hours. HbA1C: No results for input(s): HGBA1C in the last 72 hours.  CBG: Recent Labs  Lab 08/03/21 2209 08/04/21 0732 08/04/21 1245 08/04/21 1620 08/04/21 2103  GLUCAP 138* 132* 142* 116* 125*   Lipid Profile: No results for input(s): CHOL, HDL, LDLCALC, TRIG, CHOLHDL, LDLDIRECT in the last 72 hours. Thyroid Function Tests: No results for input(s): TSH, T4TOTAL, FREET4, T3FREE, THYROIDAB in the last 72 hours.  Anemia Panel: No results for input(s): VITAMINB12, FOLATE, FERRITIN, TIBC, IRON, RETICCTPCT in the last 72 hours.  Sepsis Labs: Recent Labs  Lab 07/30/21 2115 07/30/21 2235 07/31/21 0232  PROCALCITON  --   --  3.21  LATICACIDVEN 1.2 2.1*  --     Recent Results (from the past 240 hour(s))  Resp Panel by RT-PCR (Flu A&B, Covid) Nasopharyngeal Swab     Status: None   Collection Time: 07/30/21 11:19 AM   Specimen: Nasopharyngeal Swab; Nasopharyngeal(NP) swabs in vial transport medium  Result Value Ref Range Status   SARS Coronavirus 2 by RT PCR NEGATIVE  NEGATIVE Final    Comment: (NOTE) SARS-CoV-2 target nucleic acids are NOT DETECTED.  The SARS-CoV-2 RNA is generally detectable in upper respiratory specimens during the acute phase of infection. The lowest concentration of SARS-CoV-2 viral copies this assay can detect is 138 copies/mL. A negative result does not preclude SARS-Cov-2 infection and should not be used as the sole basis for treatment or other patient management decisions. A negative result may occur with  improper specimen collection/handling, submission of specimen other than nasopharyngeal swab, presence of viral mutation(s) within the areas targeted by this assay, and inadequate number of viral copies(<138 copies/mL). A negative result must be combined with clinical observations, patient history, and epidemiological information. The expected result is Negative.  Fact Sheet for Patients:  EntrepreneurPulse.com.au  Fact Sheet for Healthcare Providers:  IncredibleEmployment.be  This test is no t yet approved or cleared by the [place] FDA and  has been authorized for detection and/or diagnosis of SARS-CoV-2 by FDA under an Emergency Use Authorization (EUA). This EUA will remain  in effect (meaning this test can be used) for the duration of the COVID-19 declaration under Section 564(b)(1) of the Act, 21 U.S.C.section 360bbb-3(b)(1), unless the authorization is terminated  or revoked sooner.       Influenza A by PCR NEGATIVE NEGATIVE Final   Influenza B by PCR NEGATIVE NEGATIVE Final    Comment: (NOTE) The Xpert Xpress SARS-CoV-2/FLU/RSV plus assay is intended as an aid in the diagnosis of influenza from Nasopharyngeal swab specimens and should not be used as a sole basis for treatment. Nasal washings and aspirates are unacceptable for Xpert Xpress SARS-CoV-2/FLU/RSV testing.  Fact Sheet for Patients: EntrepreneurPulse.com.au  Fact Sheet for Healthcare  Providers: IncredibleEmployment.be  This test is not yet approved or cleared by the [place] FDA and has been authorized for detection and/or diagnosis of SARS-CoV-2 by FDA under an Emergency Use Authorization (EUA). This EUA will remain in effect (meaning this test can be used) for the duration of the COVID-19 declaration under Section 564(b)(1) of the Act, 21 U.S.C. section 360bbb-3(b)(1), unless the authorization is terminated or revoked.  Performed at [facility], [place] [address], [place], [place] [address]   Body fluid culture w Gram Stain     Status: None   Collection Time: 07/30/21  5:35 PM   Specimen: Pleura; Body Fluid  Result Value Ref Range Status   Specimen Description   Final    PLEURAL RIGHT Performed at [place] [address], [place], [place] [address]    Special Requests   Final    NONE Performed at [facility], [place] [address] [place], [place] [address]    Gram Stain   Final    ABUNDANT WBC PRESENT,BOTH PMN AND MONONUCLEAR FEW GRAM POSITIVE COCCI Performed at [facility], [place] [address] [place], [place] [address]    Culture ABUNDANT STREPTOCOCCUS INTERMEDIUS  Final   Report Status 08/03/2021 FINAL  Final   Organism ID, Bacteria STREPTOCOCCUS INTERMEDIUS  Final      Susceptibility   Streptococcus intermedius - MIC*    PENICILLIN <=0.06 SENSITIVE Sensitive     CEFTRIAXONE 0.25 SENSITIVE Sensitive     ERYTHROMYCIN <=0.12 SENSITIVE Sensitive     LEVOFLOXACIN 0.5 SENSITIVE Sensitive     VANCOMYCIN 0.5 SENSITIVE Sensitive     * ABUNDANT STREPTOCOCCUS INTERMEDIUS  Culture, blood (routine x 2)     Status: None (Preliminary result)   Collection Time: 07/30/21 10:35 PM   Specimen: Right Antecubital; Blood  Result Value Ref Range Status   Specimen Description  Final    RIGHT ANTECUBITAL Performed at [facility], [place] [address] [place], [place]  [address]    Special Requests   Final    BOTTLES DRAWN AEROBIC AND ANAEROBIC Blood Culture adequate volume Performed at [place] [address], [place], [place] [address]    Culture   Final    NO GROWTH 4 DAYS Performed at [facility], [place] [address] [place], [place] [address]    Report Status PENDING  Incomplete  Culture, blood (routine x 2)     Status: None (Preliminary result)   Collection Time: 07/30/21 10:35 PM   Specimen: Left Antecubital; Blood  Result Value Ref Range Status   Specimen Description   Final    LEFT ANTECUBITAL Performed at [name] [address], [place], [place] [address]    Special Requests   Final    BOTTLES DRAWN AEROBIC AND ANAEROBIC Blood Culture adequate volume Performed at [place] [address] [place], [place] [address]    Culture   Final    NO GROWTH 4 DAYS Performed at [facility], [place] [address], [place], [place] [address]    Report Status PENDING  Incomplete  MRSA Next Gen by PCR, Nasal     Status: None   Collection Time: 07/30/21 11:07 PM   Specimen: Nasal Mucosa; Nasal Swab  Result Value Ref Range Status   MRSA by PCR Next Gen NOT DETECTED NOT DETECTED Final    Comment: (NOTE) The GeneXpert MRSA Assay (FDA approved for NASAL specimens only), is one component of a comprehensive MRSA colonization surveillance program. It is not intended to diagnose MRSA infection nor to guide or monitor treatment for MRSA infections. Test performance is not FDA approved in patients less than 104 years old. Performed at [facility], [place] [address], [place], [place] [address]   Urine Culture     Status: Abnormal   Collection Time: 07/31/21  2:20 PM   Specimen: Urine, Clean Catch  Result Value Ref Range Status   Specimen Description   Final    URINE, CLEAN CATCH Performed at [facility], [place] [address] [place], [place]  [address]    Special Requests   Final    NONE Performed at [facility], [place] [address], [place], [place] [address]    Culture (A)  Final    <10,000 COLONIES/mL INSIGNIFICANT GROWTH Performed at [place] [address], [place], [place] [address]    Report Status 08/02/2021 FINAL  Final  Expectorated Sputum Assessment w Gram Stain, Rflx to Resp Cult     Status: None   Collection Time: 08/02/21 10:15 AM   Specimen: Expectorated Sputum  Result Value Ref Range Status   Specimen Description EXPECTORATED SPUTUM  Final   Special Requests NONE  Final   Sputum evaluation   Final    THIS SPECIMEN IS ACCEPTABLE FOR SPUTUM CULTURE Performed at [facility], [place] [address], [place], [place] [address]    Report Status 08/02/2021 FINAL  Final  Culture, Respiratory w Gram Stain     Status: None   Collection Time: 08/02/21 10:15 AM  Result Value Ref Range Status   Specimen Description   Final    EXPECTORATED SPUTUM Performed at [facility], [place] [address], [place], [place] [address]    Special Requests   Final    NONE Reflexed from [id number] Performed  at [facility], [place] [address] [place], [place] [address]    Gram Stain   Final    FEW WBC PRESENT, PREDOMINANTLY PMN FEW GRAM NEGATIVE COCCOBACILLI    Culture   Final    FEW Normal respiratory flora-no Staph aureus or Pseudomonas seen Performed at [place] [address] [place], [place] [address]    Report Status 08/04/2021 FINAL  Final         Radiology Studies: No results found.      Scheduled Meds:  aspirin EC  81 mg Oral QHS   atorvastatin  40 mg Oral QHS   Chlorhexidine Gluconate Cloth  6 each Topical Daily   chlorpheniramine-HYDROcodone  5 mL Oral Q12H   enoxaparin (LOVENOX) injection  40 mg Subcutaneous Q24H   feeding supplement  1 Container Oral TID BM   ferrous sulfate  325 mg Oral Q breakfast   guaiFENesin  600 mg  Oral BID   insulin aspart  0-15 Units Subcutaneous TID WC   insulin aspart  0-5 Units Subcutaneous QHS   insulin glargine-yfgn  20 Units Subcutaneous Daily   levothyroxine  75 mcg Oral Q0600   lidocaine  1 patch Transdermal Daily   mouth rinse  15 mL Mouth Rinse BID   multivitamin with minerals  1 tablet Oral Daily   pantoprazole  40 mg Oral Daily   revefenacin  175 mcg Nebulization Daily   senna-docusate  2 tablet Oral QHS   sodium chloride flush  10 mL Other Q8H   sodium chloride flush  10-40 mL Intracatheter Q12H   Continuous Infusions:  cefTRIAXone (ROCEPHIN)  IV Stopped (08/04/21 1733)     LOS: 6 days    Time spent:35 mins, More than 50% of that time was spent in counseling and/or coordination of care.      Shelly Coss, MD [facility] P9/21/2022, 7:36 AM

## 2021-08-06 ENCOUNTER — Telehealth: Payer: Self-pay | Admitting: Medical Oncology

## 2021-08-06 ENCOUNTER — Inpatient Hospital Stay (HOSPITAL_COMMUNITY): Payer: BC Managed Care – PPO

## 2021-08-06 DIAGNOSIS — Z515 Encounter for palliative care: Secondary | ICD-10-CM | POA: Diagnosis not present

## 2021-08-06 DIAGNOSIS — J869 Pyothorax without fistula: Secondary | ICD-10-CM | POA: Diagnosis not present

## 2021-08-06 DIAGNOSIS — C3411 Malignant neoplasm of upper lobe, right bronchus or lung: Secondary | ICD-10-CM | POA: Diagnosis not present

## 2021-08-06 DIAGNOSIS — Z7189 Other specified counseling: Secondary | ICD-10-CM | POA: Diagnosis not present

## 2021-08-06 LAB — CBC WITH DIFFERENTIAL/PLATELET
Abs Immature Granulocytes: 0.25 10*3/uL — ABNORMAL HIGH (ref 0.00–0.07)
Basophils Absolute: 0.1 10*3/uL (ref 0.0–0.1)
Basophils Relative: 0 %
Eosinophils Absolute: 0.2 10*3/uL (ref 0.0–0.5)
Eosinophils Relative: 1 %
HCT: 29.6 % — ABNORMAL LOW (ref 39.0–52.0)
Hemoglobin: 9 g/dL — ABNORMAL LOW (ref 13.0–17.0)
Immature Granulocytes: 1 %
Lymphocytes Relative: 4 %
Lymphs Abs: 0.9 10*3/uL (ref 0.7–4.0)
MCH: 28.6 pg (ref 26.0–34.0)
MCHC: 30.4 g/dL (ref 30.0–36.0)
MCV: 94 fL (ref 80.0–100.0)
Monocytes Absolute: 1.6 10*3/uL — ABNORMAL HIGH (ref 0.1–1.0)
Monocytes Relative: 7 %
Neutro Abs: 20.1 10*3/uL — ABNORMAL HIGH (ref 1.7–7.7)
Neutrophils Relative %: 87 %
Platelets: 349 10*3/uL (ref 150–400)
RBC: 3.15 MIL/uL — ABNORMAL LOW (ref 4.22–5.81)
RDW: 20.1 % — ABNORMAL HIGH (ref 11.5–15.5)
WBC: 23.1 10*3/uL — ABNORMAL HIGH (ref 4.0–10.5)
nRBC: 0 % (ref 0.0–0.2)

## 2021-08-06 LAB — GLUCOSE, CAPILLARY
Glucose-Capillary: 132 mg/dL — ABNORMAL HIGH (ref 70–99)
Glucose-Capillary: 126 mg/dL — ABNORMAL HIGH (ref 70–99)
Glucose-Capillary: 125 mg/dL — ABNORMAL HIGH (ref 70–99)
Glucose-Capillary: 98 mg/dL (ref 70–99)

## 2021-08-06 LAB — CREATININE, SERUM
Creatinine, Ser: 0.76 mg/dL (ref 0.61–1.24)
GFR, Estimated: >60 mL/min (ref >60)

## 2021-08-06 MED ORDER — GUAIFENESIN ER 600 MG PO TB12
1200.0000 mg | ORAL_TABLET | Freq: Two times a day (BID) | ORAL | Status: DC
  Administered 2021-08-06 – 2021-08-18 (×24): 1200 mg via ORAL
  Filled 2021-08-06 (×24): qty 2

## 2021-08-06 MED ORDER — SALINE SPRAY 0.65 % NA SOLN
2.0000 | Freq: Two times a day (BID) | NASAL | Status: DC
  Administered 2021-08-06 – 2021-08-18 (×23): 2 via NASAL
  Filled 2021-08-06: qty 44

## 2021-08-06 MED ORDER — PROSOURCE PLUS PO LIQD
30.0000 mL | Freq: Two times a day (BID) | ORAL | Status: DC
  Administered 2021-08-07 – 2021-08-18 (×16): 30 mL via ORAL
  Filled 2021-08-06 (×15): qty 30

## 2021-08-06 MED ORDER — FLUTICASONE PROPIONATE 50 MCG/ACT NA SUSP
2.0000 | Freq: Every day | NASAL | Status: DC
  Administered 2021-08-06 – 2021-08-18 (×13): 2 via NASAL
  Filled 2021-08-06: qty 16

## 2021-08-06 NOTE — Progress Notes (Signed)
PROGRESS NOTE    William Farrell  VQQ:[id number] DOB: 02/01/1961 DOA: 07/30/2021 PCP: [name], DO   Chief Complain: Fatigue, poor oral intake  Brief Narrative: Patient is a 60 year old male with history of stage IIIa non-small cell lung cancer currently on chemotherapy, hyperlipidemia, diabetes type 2 who presented here with complaints of fatigue, poor oral intake for 2 weeks before admission.  On presentation he was found to be in A. fib with RVR, DKA and also found to have loculated right-sided pleural effusion.  PCCM was consulted on presentation, status post right chest tube placement.  Currently on broad-spectrum antibiotics for loculated right-sided pleural effusion.  Oncology following.  A. fib with RVR has resolved and currently in normal sinus rhythm.  Still on chest tube, plan for removal tomorrow.  PCCM following  Assessment & Plan:   Principal Problem:   Empyema of right pleural space (HCC) Active Problems:   Type 2 diabetes mellitus without complication, without long-term current use of insulin (HCC)   Malignant neoplasm of right upper lobe of lung (HCC)   Atrial fibrillation with rapid ventricular response (HCC)   Malnutrition of moderate degree   Sepsis secondary to loculated right-sided pleural effusion: Presented with shortness of breath.  Status post chest tube placement on 9/15.  Procalcitonin was elevated.  Still has significant leukocytosis,but  afebrile.  He was on broad-spectrum antibiotics with vancomycin, cefepime, Flagyl.  Pleural fluid culture showing streptococcus intermedius, pansensitive.  Blood cultures are negative . Antibiotic has been changed to ceftriaxone,day 6.  Sputum culture showing few gram-negative coccobacilli. Cough is better.Chest tube drainage is decreasing.  Plan to remove the chest tube when the drain is less than 50-100 cc, likely tomorrow  Stage III non-small cell lung cancer: Follows with Dr. Julien Nordmann, he was being treated with  chemo.   Oncology following. Chest CT done on 9.18.22 showed  extensive, diffuse pleural thickening and adjacent interlobular septal thickening with a small, loculated hydropneumothorax, approximately 10% volume,perihilar and suprahilar right lung malignancy with adjacent satellite nodularity,multiple small pulmonary nodules throughout the lungs consistent with worsening pulmonary metastatic disease. Pleural fluid cytology negative for malignancy  A. fib with RVR: Likely transient, now resolved after starting on Cardizem drip.  Currently in normal sinus rhythm.  TSH normal.  Echocardiogram showed EF of 60 to 65%, normal left ventricular diastolic parameters.  Not a started on anticoagulation because of transient nature of the A. fib with RVR/likely related to pulmonary issues.  DKA/diabetes type 2: Initially started on insulin drip now transitioned to subcutaneous insulin.  Takes metformin, glipizide, Ozempic at home.  Monitor blood sugars.  Continue current insulin regimen.  Hemoglobin A1c of 6.6.  AKI on CKD stage IIIa: Creatinine was in the range of 2 on presentation.  Baseline creatinine around 1.3.  Currently kidney function is normal.  Chronic normocytic anemia: Baseline hemoglobin around 8.   No evidence of acute blood loss.  Most likely associated with underlying malignancy, chemotherapy.  Iron studies showed low iron,given IV iron infusion.  Transfused with a unit of PRBC on 08/02/2021.  Hyperlipidemia: On statin  Hypothyroidism: On Synthyroid  GERD: Continue PPI  Hypokalemia/hypomagnesemia: Being monitored and supplemented intermittently  Moderate malnutrition: Secondary to malignancy.  Nutrition following  Debility/deconditioning: PT/OT evaluated the patient and recommend home health on discharge.  Nutrition Problem: Moderate Malnutrition Etiology: chronic illness, cancer and cancer related treatments      DVT prophylaxis:Lovenox Code Status: Full Family Communication:  Wife present at bedside Status is: Inpatient  Remains inpatient appropriate  because:Inpatient level of care appropriate due to severity of illness  Dispo: The patient is from: Home              Anticipated d/c is to: Home              Patient currently is not medically stable to d/c.   Difficult to place patient No    Consultants: Oncology,PCCM  Procedures:Chest tube placement  Antimicrobials:  Anti-infectives (From admission, onward)    Start     Dose/Rate Route Frequency Ordered Stop   08/05/21 1800  cefTRIAXone (ROCEPHIN) 2 g in sodium chloride 0.9 % 100 mL IVPB        2 g 200 mL/hr over 30 Minutes Intravenous Every 24 hours 08/05/21 1316     08/02/21 1800  cefTRIAXone (ROCEPHIN) 2 g in sodium chloride 0.9 % 100 mL IVPB  Status:  Discontinued        2 g 200 mL/hr over 30 Minutes Intravenous Every 24 hours 08/02/21 0947 08/05/21 1316   08/02/21 1100  vancomycin (VANCOREADY) IVPB 1250 mg/250 mL  Status:  Discontinued        1,250 mg 166.7 mL/hr over 90 Minutes Intravenous Every 12 hours 08/02/21 0726 08/02/21 0910   08/01/21 1100  vancomycin (VANCOCIN) IVPB 1000 mg/200 mL premix  Status:  Discontinued        1,000 mg 200 mL/hr over 60 Minutes Intravenous Every 12 hours 08/01/21 0951 08/02/21 0726   07/31/21 1800  vancomycin (VANCOREADY) IVPB 1250 mg/250 mL  Status:  Discontinued        1,250 mg 166.7 mL/hr over 90 Minutes Intravenous Every 24 hours 07/30/21 1911 07/31/21 0845   07/31/21 1800  vancomycin (VANCOREADY) IVPB 1500 mg/300 mL  Status:  Discontinued        1,500 mg 150 mL/hr over 120 Minutes Intravenous Every 24 hours 07/31/21 0845 08/01/21 0951   07/31/21 0945  ceFEPIme (MAXIPIME) 2 g in sodium chloride 0.9 % 100 mL IVPB  Status:  Discontinued        2 g 200 mL/hr over 30 Minutes Intravenous Every 8 hours 07/31/21 0845 08/02/21 0948   07/30/21 2000  metroNIDAZOLE (FLAGYL) IVPB 500 mg  Status:  Discontinued        500 mg 100 mL/hr over 60 Minutes Intravenous Every  12 hours 07/30/21 1738 08/03/21 1019   07/30/21 1830  vancomycin (VANCOREADY) IVPB 1750 mg/350 mL        1,750 mg 175 mL/hr over 120 Minutes Intravenous  Once 07/30/21 1738 07/30/21 1846   07/30/21 1800  ceFEPIme (MAXIPIME) 2 g in sodium chloride 0.9 % 100 mL IVPB  Status:  Discontinued        2 g 200 mL/hr over 30 Minutes Intravenous Every 12 hours 07/30/21 1723 07/31/21 0845       Subjective: Patient seen and examined at the bedside this morning.  Hemodynamically stable.  On room air.  Looks overall comfortable.  Chest tube drainage is decreasing.  Still having some cough and chest pain  Objective: Vitals:   08/06/21 0500 08/06/21 0600 08/06/21 0700 08/06/21 0728  BP: (!) 110/49 (!) 116/53 106/65   Pulse: (!) 106     Resp: 19 (!) 21 (!) 0   Temp:    (!) 96.1 F (35.6 C)  TempSrc:    Axillary  SpO2: 92% 91%    Weight:      Height:        Intake/Output Summary (Last 24 hours) at 08/06/2021 [phone]  Last data filed at 08/06/2021 0102 Gross per 24 hour  Intake 147.67 ml  Output 1140 ml  Net -992.33 ml   Filed Weights   07/30/21 1101 07/30/21 1133  Weight: 95.4 kg 88.9 kg    Examination: General exam: Overall comfortable, not in distress, chronically ill looking HEENT: PERRL Respiratory system:  no wheezes or crackles , right-sided chest tube Cardiovascular system: S1 & S2 heard, RRR.  Gastrointestinal system: Abdomen is nondistended, soft and nontender. Central nervous system: Alert and oriented Extremities: No edema, no clubbing ,no cyanosis Skin: No rashes, no ulcers,no icterus    Data Reviewed: I have personally reviewed following labs and imaging studies  CBC: Recent Labs  Lab 07/30/21 0927 07/30/21 2219 08/02/21 0522 08/03/21 0746 08/04/21 0542 08/05/21 0533 08/06/21 0424  WBC 44.5*   < > 17.6* 25.8* 24.4* 24.5* 23.1*  NEUTROABS 41.6*  --   --   --  21.7* 21.7* 20.1*  HGB 9.2*   < > 7.0* 8.8* 9.1* 9.8* 9.0*  HCT 28.3*   < > 22.9* 28.4* 30.0* 31.7* 29.6*   MCV 87.6   < > 94.6 91.3 92.0 93.0 94.0  PLT 530*   < > 287 366 374 385 349   < > = values in this interval not displayed.   Basic Metabolic Panel: Recent Labs  Lab 07/31/21 0800 08/01/21 0445 08/02/21 0522 08/03/21 0559 08/03/21 0746 08/04/21 0542 08/06/21 0424  NA 138 143 141  --  138 137  --   K 4.3 4.4 3.4*  --  4.3 4.5  --   CL 106 113* 116*  --  109 106  --   CO2 21* 20* 19*  --  21* 23  --   GLUCOSE 173* 187* 132*  --  152* 137*  --   BUN 62* 41* 23*  --  19 16  --   CREATININE 1.23 1.06 0.66  --  0.83 0.78 0.76  CALCIUM 9.4 9.5 7.7*  --  9.2 9.0  --   MG  --  2.1 1.6* 1.9  --  1.7  --    GFR: Estimated Creatinine Clearance: 107.8 mL/min (by C-G formula based on SCr of 0.76 mg/dL). Liver Function Tests: Recent Labs  Lab 07/30/21 0927 07/30/21 1822 07/31/21 0232  AST 20  --  14*  ALT 16  --  14  ALKPHOS 216*  --  139*  BILITOT 0.4  --  0.4  PROT 8.0 7.4 7.0  ALBUMIN 1.9*  --  2.1*   No results for input(s): LIPASE, AMYLASE in the last 168 hours. No results for input(s): AMMONIA in the last 168 hours. Coagulation Profile: Recent Labs  Lab 07/31/21 0232  INR 1.7*   Cardiac Enzymes: No results for input(s): CKTOTAL, CKMB, CKMBINDEX, TROPONINI in the last 168 hours. BNP (last 3 results) No results for input(s): PROBNP in the last 8760 hours. HbA1C: No results for input(s): HGBA1C in the last 72 hours.  CBG: Recent Labs  Lab 08/05/21 0733 08/05/21 1132 08/05/21 1615 08/05/21 2142 08/06/21 0727  GLUCAP 140* 171* 126* 115* 132*   Lipid Profile: No results for input(s): CHOL, HDL, LDLCALC, TRIG, CHOLHDL, LDLDIRECT in the last 72 hours. Thyroid Function Tests: No results for input(s): TSH, T4TOTAL, FREET4, T3FREE, THYROIDAB in the last 72 hours.  Anemia Panel: No results for input(s): VITAMINB12, FOLATE, FERRITIN, TIBC, IRON, RETICCTPCT in the last 72 hours.  Sepsis Labs: Recent Labs  Lab 07/30/21 2115 07/30/21 2235 07/31/21 0232   PROCALCITON  --   --  3.21  LATICACIDVEN 1.2 2.1*  --     Recent Results (from the past 240 hour(s))  Resp Panel by RT-PCR (Flu A&B, Covid) Nasopharyngeal Swab     Status: None   Collection Time: 07/30/21 11:19 AM   Specimen: Nasopharyngeal Swab; Nasopharyngeal(NP) swabs in vial transport medium  Result Value Ref Range Status   SARS Coronavirus 2 by RT PCR NEGATIVE NEGATIVE Final    Comment: (NOTE) SARS-CoV-2 target nucleic acids are NOT DETECTED.  The SARS-CoV-2 RNA is generally detectable in upper respiratory specimens during the acute phase of infection. The lowest concentration of SARS-CoV-2 viral copies this assay can detect is 138 copies/mL. A negative result does not preclude SARS-Cov-2 infection and should not be used as the sole basis for treatment or other patient management decisions. A negative result may occur with  improper specimen collection/handling, submission of specimen other than nasopharyngeal swab, presence of viral mutation(s) within the areas targeted by this assay, and inadequate number of viral copies(<138 copies/mL). A negative result must be combined with clinical observations, patient history, and epidemiological information. The expected result is Negative.  Fact Sheet for Patients:  EntrepreneurPulse.com.au  Fact Sheet for Healthcare Providers:  IncredibleEmployment.be  This test is no t yet approved or cleared by the [place] FDA and  has been authorized for detection and/or diagnosis of SARS-CoV-2 by FDA under an Emergency Use Authorization (EUA). This EUA will remain  in effect (meaning this test can be used) for the duration of the COVID-19 declaration under Section 564(b)(1) of the Act, 21 U.S.C.section 360bbb-3(b)(1), unless the authorization is terminated  or revoked sooner.       Influenza A by PCR NEGATIVE NEGATIVE Final   Influenza B by PCR NEGATIVE NEGATIVE Final    Comment: (NOTE) The  Xpert Xpress SARS-CoV-2/FLU/RSV plus assay is intended as an aid in the diagnosis of influenza from Nasopharyngeal swab specimens and should not be used as a sole basis for treatment. Nasal washings and aspirates are unacceptable for Xpert Xpress SARS-CoV-2/FLU/RSV testing.  Fact Sheet for Patients: EntrepreneurPulse.com.au  Fact Sheet for Healthcare Providers: IncredibleEmployment.be  This test is not yet approved or cleared by the [place] FDA and has been authorized for detection and/or diagnosis of SARS-CoV-2 by FDA under an Emergency Use Authorization (EUA). This EUA will remain in effect (meaning this test can be used) for the duration of the COVID-19 declaration under Section 564(b)(1) of the Act, 21 U.S.C. section 360bbb-3(b)(1), unless the authorization is terminated or revoked.  Performed at [facility], [place] [address], [place], [place] [address]   Body fluid culture w Gram Stain     Status: None   Collection Time: 07/30/21  5:35 PM   Specimen: Pleura; Body Fluid  Result Value Ref Range Status   Specimen Description   Final    PLEURAL RIGHT Performed at [place] [address] [place], [place] [address]    Special Requests   Final    NONE Performed at [facility], [place] [address] [place], [place] [address]    Gram Stain   Final    ABUNDANT WBC PRESENT,BOTH PMN AND MONONUCLEAR FEW GRAM POSITIVE COCCI Performed at [facility], [place] [address] [place],  [address]    Culture ABUNDANT STREPTOCOCCUS INTERMEDIUS  Final   Report Status 08/03/2021 FINAL  Final   Organism ID, Bacteria STREPTOCOCCUS INTERMEDIUS  Final      Susceptibility   Streptococcus intermedius - MIC*  PENICILLIN <=0.06 SENSITIVE Sensitive     CEFTRIAXONE 0.25 SENSITIVE Sensitive     ERYTHROMYCIN <=0.12 SENSITIVE Sensitive     LEVOFLOXACIN 0.5 SENSITIVE Sensitive     VANCOMYCIN  0.5 SENSITIVE Sensitive     * ABUNDANT STREPTOCOCCUS INTERMEDIUS  Culture, blood (routine x 2)     Status: None   Collection Time: 07/30/21 10:35 PM   Specimen: Right Antecubital; Blood  Result Value Ref Range Status   Specimen Description   Final    RIGHT ANTECUBITAL Performed at [place] [address] [place], [place] [address]    Special Requests   Final    BOTTLES DRAWN AEROBIC AND ANAEROBIC Blood Culture adequate volume Performed at [place] [address] [place], [place] [address]    Culture   Final    NO GROWTH 5 DAYS Performed at [facility], [place] [address], [place], [place] [address]    Report Status 08/05/2021 FINAL  Final  Culture, blood (routine x 2)     Status: None   Collection Time: 07/30/21 10:35 PM   Specimen: Left Antecubital; Blood  Result Value Ref Range Status   Specimen Description   Final    LEFT ANTECUBITAL Performed at [place] [address] [place], [place] [address]    Special Requests   Final    BOTTLES DRAWN AEROBIC AND ANAEROBIC Blood Culture adequate volume Performed at [place] [address] [place], [place] [address]    Culture   Final    NO GROWTH 5 DAYS Performed at [facility], [place] [address] [place], [place] [address]    Report Status 08/05/2021 FINAL  Final  MRSA Next Gen by PCR, Nasal     Status: None   Collection Time: 07/30/21 11:07 PM   Specimen: Nasal Mucosa; Nasal Swab  Result Value Ref Range Status   MRSA by PCR Next Gen NOT DETECTED NOT DETECTED Final    Comment: (NOTE) The GeneXpert MRSA Assay (FDA approved for NASAL specimens only), is one component of a comprehensive MRSA colonization surveillance program. It is not intended to diagnose MRSA infection nor to guide or monitor treatment for MRSA infections. Test performance is not FDA approved in patients less than 13 years old. Performed at [facility], [place] [address], [place], [place] [address]   Urine Culture     Status: Abnormal   Collection Time: 07/31/21  2:20 PM   Specimen: Urine, Clean Catch  Result Value Ref Range Status   Specimen Description   Final    URINE, CLEAN CATCH Performed at [facility], [place] [address] [place], [place] [address]    Special Requests   Final    NONE Performed at [facility], [place] [address] [place], [place] [address]    Culture (A)  Final    <10,000 COLONIES/mL INSIGNIFICANT GROWTH Performed at [facility] [address], [place], [place] [address]    Report Status 08/02/2021 FINAL  Final  Expectorated Sputum Assessment w Gram Stain, Rflx to Resp Cult     Status: None   Collection Time: 08/02/21 10:15 AM   Specimen: Expectorated Sputum  Result Value Ref Range Status   Specimen Description EXPECTORATED SPUTUM  Final   Special Requests NONE  Final   Sputum evaluation   Final    THIS SPECIMEN IS ACCEPTABLE FOR SPUTUM CULTURE Performed at [facility], [address] [place], [place] [address]    Report Status 08/02/2021 FINAL  Final  Culture, Respiratory w Gram Stain     Status: None   Collection Time: 08/02/21 10:15 AM  Result Value Ref Range Status   Specimen Description   Final    EXPECTORATED SPUTUM Performed at [facility], [place] [address] [place], [place] [address]    Special Requests   Final    NONE Reflexed from [id number] Performed at [facility], [place] [address], [place], [place] [address]    Gram Stain   Final    FEW WBC PRESENT, PREDOMINANTLY PMN FEW GRAM NEGATIVE COCCOBACILLI    Culture   Final    FEW Normal respiratory flora-no Staph aureus or Pseudomonas seen Performed at [place] [address] [place], [place] [address]    Report Status 08/04/2021 FINAL  Final         Radiology Studies: No results found.      Scheduled  Meds:  aspirin EC  81 mg Oral QHS   atorvastatin  40 mg Oral QHS   Chlorhexidine Gluconate Cloth  6 each Topical Daily   chlorpheniramine-HYDROcodone  5 mL Oral Q12H   enoxaparin (LOVENOX) injection  40 mg Subcutaneous Q24H   feeding supplement  1 Container Oral TID BM   ferrous sulfate  325 mg Oral Q breakfast   guaiFENesin  600 mg Oral BID   insulin aspart  0-15 Units Subcutaneous TID WC   insulin aspart  0-5 Units Subcutaneous QHS   insulin glargine-yfgn  20 Units Subcutaneous Daily   levothyroxine  75 mcg Oral Q0600   lidocaine  1 patch Transdermal Daily   mouth rinse  15 mL Mouth Rinse BID   multivitamin with minerals  1 tablet Oral Daily   pantoprazole  40 mg Oral Daily   revefenacin  175 mcg Nebulization Daily   senna-docusate  2 tablet Oral QHS   sodium chloride flush  10 mL Other Q8H   sodium chloride flush  10-40 mL Intracatheter Q12H   Continuous Infusions:  cefTRIAXone (ROCEPHIN)  IV Stopped (08/05/21 1732)     LOS: 7 days    Time spent:35 mins, More than 50% of that time was spent in counseling and/or coordination of care.      Shelly Coss, MD [facility] P9/22/2022, 7:33 AM

## 2021-08-06 NOTE — Progress Notes (Signed)
Physical Therapy Treatment Patient Details Name: William Farrell MRN: [id number] DOB: 09/22/1961 Today's Date: 08/06/2021   History of Present Illness Pt is 60 year old with history of stage IIIa non-small cell lung cancer on chemo who presents to the hospital for fatigue and poor oral intake for the past 2 weeks on 07/30/21.  He was found to be in atrial fibrillation with RVR, DKA and had loculated pleural effusion. Chest tube was inserted 07/30/2021. Pt with other hx of HLD and DM2.    PT Comments    Patient agreeable to mobilize this session but remains limited with CT on continuous suction to wall still. Min guard for bed mob and stand step transfer to move bed>chair. Assist for line management and cues for safety as pt eager/impatient to mobilize. Patient completed 2 sets of sit<>stand transfers with no UE use for first set and single UE use on second set to assist with power up. Educated on benefits of continuing to complete LE exercises and exercises; pt verbalized understanding. Acute PT will continue to progress as able.    Recommendations for follow up therapy are one component of a multi-disciplinary discharge planning process, led by the attending physician.  Recommendations may be updated based on patient status, additional functional criteria and insurance authorization.  Follow Up Recommendations  Home health PT;Supervision for mobility/OOB     Equipment Recommendations   (rollator/ [phone] with seat)    Recommendations for Other Services       Precautions / Restrictions Precautions Precautions: Fall Precaution Comments: R CT to wall suction Restrictions Weight Bearing Restrictions: No     Mobility  Bed Mobility Overal bed mobility: Needs Assistance Bed Mobility: Supine to Sit     Supine to sit: Supervision;HOB elevated     General bed mobility comments: supervision to transfer to edge of bed - assistance for lines/leads/CT    Transfers Overall transfer  level: Needs assistance Equipment used: None Transfers: Sit to/from Stand;Stand Pivot Transfers Sit to Stand: Min guard Stand pivot transfers: Min guard       General transfer comment: min guard for safety and assist for lines. no device needed for transfer. pt limited to bed<>chair due to CT to suction. (RN reports may come out tomorrow)  Ambulation/Gait                 Stairs             Wheelchair Mobility    Modified Rankin (Stroke Patients Only)       Balance Overall balance assessment: Mild deficits observed, not formally tested Sitting-balance support: Feet supported;No upper extremity supported Sitting balance-Leahy Scale: Good     Standing balance support: During functional activity;No upper extremity supported Standing balance-Leahy Scale: Fair Standing balance comment: reaching out for bed rail or chair arm rest with stand step transfer                            Cognition Arousal/Alertness: Awake/alert Behavior During Therapy: Flat affect Overall Cognitive Status: Within Functional Limits for tasks assessed                                        Exercises Other Exercises Other Exercises: Repeated Sit<>Stands: 2x5 reps (no UE use for first set, single UE use for second set)    General Comments  Pertinent Vitals/Pain Pain Assessment: 0-10 Pain Score: 6  Faces Pain Scale: Hurts a little bit Pain Location: CT site Pain Descriptors / Indicators: Grimacing;Discomfort Pain Intervention(s): Premedicated before session;Monitored during session    Home Living                      Prior Function            PT Goals (current goals can now be found in the care plan section) Acute Rehab PT Goals Patient Stated Goal: return home; walk more; get chest tube out PT Goal Formulation: With patient/family Time For Goal Achievement: 08/14/21 Potential to Achieve Goals: Good Progress towards PT goals:  Progressing toward goals (limited by continuous suction with chest tube)    Frequency    Min 3X/week      PT Plan Current plan remains appropriate    Co-evaluation              AM-PAC PT "6 Clicks" Mobility   Outcome Measure  Help needed turning from your back to your side while in a flat bed without using bedrails?: A Little Help needed moving from lying on your back to sitting on the side of a flat bed without using bedrails?: A Little Help needed moving to and from a bed to a chair (including a wheelchair)?: A Little Help needed standing up from a chair using your arms (e.g., wheelchair or bedside chair)?: A Little Help needed to walk in hospital room?: A Little Help needed climbing 3-5 steps with a railing? : A Lot 6 Click Score: 17    End of Session Equipment Utilized During Treatment: Gait belt Activity Tolerance: Patient tolerated treatment well Patient left: in chair;with call bell/phone within reach;with chair alarm set Nurse Communication: Mobility status (RN giving CHG bath at start) PT Visit Diagnosis: Other abnormalities of gait and mobility (R26.89);Muscle weakness (generalized) (M62.81)     Time: 2202-5427 PT Time Calculation (min) (ACUTE ONLY): 24 min  Charges:  $Therapeutic Exercise: 8-22 mins $Therapeutic Activity: 8-22 mins                     Verner Mould, DPT Acute Rehabilitation Services Office [phone] Pager [phone]    Jacques Navy 08/06/2021, 12:53 PM

## 2021-08-06 NOTE — Telephone Encounter (Signed)
Oxycodone refill request via surescripts"  I called pharmacist and told her that pt is currently hospitalized and this refill will be addressed after he is discharged.

## 2021-08-06 NOTE — Progress Notes (Signed)
NAME:  William Farrell, MRN:  [id number], DOB:  [birth year], LOS: 7 ADMISSION DATE:  07/30/2021, CONSULTATION DATE:  9/15 REFERRING MD:  Dr Marylyn Ishihara, CHIEF COMPLAINT:  SOB/weakness   History of Present Illness:  60 year old male with prior history of Stage IIIa NSCLC (SCC) RUL diagnosed 02/2020 (RUL/suprahilar mass and R paratracheal and subcarinal LAD) s/p chemo XRT including keytruda, consolidation IT with durvalumab, SBRT to RUL nodules followed by Dr. Julien Nordmann. He is receiving neulasta support.   Presented to Dr. Julien Nordmann for follow-up, reportedly not felt well for 1-2 weeks, 15 lb weight loss since his prior visit, pain under right breast, worse with cough and progressive dyspnea on exertion even on his home O2.  He has had productive cough, orthopnea that has worsened over the last week. He has borrowed his wife's oxygen. A little hemoptysis, but no nausea, vomiting, or diarrhea.  No fever/chills.  He does acknowledge some trouble with aspiration of solids/liquids. Was due to start cycle #12 today.  Pertinent  Medical History  NSCLC stage IIIA (dx 02/2020)  Significant Hospital Events: Including procedures, antibiotic start and stop dates in addition to other pertinent events   Chest tube placement 07/30/2021 Cytology pleural fluid >> no malignant cells Pleural fluid cx > pan-sensitive Strep intermedius  Interim History / Subjective:   Chest tube drainage has decreased to 90 cc over the last 24 hours He is working some with PT He continues to have cough of some clear/yellow mucus, nasal congestion.  He is also seeing some blood in his nasal discharge and sputum. No fevers over the last 3 days   Objective   Blood pressure 106/65, pulse (!) 106, temperature (!) 96.1 F (35.6 C), temperature source Axillary, resp. rate (!) 0, height 6' (1.829 m), weight 88.9 kg, SpO2 91 %.        Intake/Output Summary (Last 24 hours) at 08/06/2021 0815 Last data filed at 08/06/2021 9937 Gross per 24  hour  Intake 147.67 ml  Output 1140 ml  Net -992.33 ml   Filed Weights   07/30/21 1101 07/30/21 1133  Weight: 95.4 kg 88.9 kg    Examination: General: Comfortable in bed, no distress HENT: Oropharynx clear, some nasal discharge, clear. Lungs: Posterior chest tube site clean.  Continues to drain some yellow fluid but thinner and lower volume.  Lungs coarse bilaterally and decreased to both bases Cardiovascular: Regular, distant, borderline tachycardic, no murmur Abdomen: Nondistended, positive bowel sounds Extremities: No lower extremity edema Neuro: Awake, alert, answers questions, oriented, nonfocal  Resolved Hospital Problem list     Assessment & Plan:   Right strep intermedius empyema with chest tube in place 9/15.  Continues to have yellow purulent material draining from chest tube, but thinner and lower volume -Plan to leave chest tube in place for another day.  If output remains less than 100 cc / 24 hours then we can remove it. -Continue saline flushes to ensure chest tube patency -Continue ceftriaxone, day 6.  Metronidazole stopped on 9/19 -Pain control with a chest tube -Need to push PT/OT, out of bed  Non-small cell lung cancer, currently undergoing therapy.  Concern for possible progressive disease most recent CT. pleural fluid cytology negative -Current therapy is on hold since he had acute infection.  Appreciate Dr. Worthy Flank assistance  Persistent cough, purulent sputum -Respiratory culture normal flora -Repeat chest x-ray 9/22 -Will work to better address his nasal congestion, drainage, which may be contributing to his mucus burden, cough.  Atrial fibrillation with RVR,  resolved.  Was transient in the setting of acute infection -Currently on prophylactic enoxaparin  Diabetes mellitus type 2 -As per [facility] plans   Best Practice (right click and "Reselect all SmartList Selections" daily)   Diet/type: Regular consistency (see orders) DVT prophylaxis:  GI  prophylaxis: PPI Lines: N/A Foley:  N/A Code Status:  full code Last date of multidisciplinary goals of care discussion [Per primary] Family: Updated the patient and his wife at bedside on 9/22 Disposition >> he could move to regular floor bed for continued treatment and monitoring of his chest tube, chest tube output  Labs   CBC: Recent Labs  Lab 07/30/21 0927 07/30/21 2219 08/02/21 0522 08/03/21 0746 08/04/21 0542 08/05/21 0533 08/06/21 0424  WBC 44.5*   < > 17.6* 25.8* 24.4* 24.5* 23.1*  NEUTROABS 41.6*  --   --   --  21.7* 21.7* 20.1*  HGB 9.2*   < > 7.0* 8.8* 9.1* 9.8* 9.0*  HCT 28.3*   < > 22.9* 28.4* 30.0* 31.7* 29.6*  MCV 87.6   < > 94.6 91.3 92.0 93.0 94.0  PLT 530*   < > 287 366 374 385 349   < > = values in this interval not displayed.    Basic Metabolic Panel: Recent Labs  Lab 07/31/21 0800 08/01/21 0445 08/02/21 0522 08/03/21 0559 08/03/21 0746 08/04/21 0542 08/06/21 0424  NA 138 143 141  --  138 137  --   K 4.3 4.4 3.4*  --  4.3 4.5  --   CL 106 113* 116*  --  109 106  --   CO2 21* 20* 19*  --  21* 23  --   GLUCOSE 173* 187* 132*  --  152* 137*  --   BUN 62* 41* 23*  --  19 16  --   CREATININE 1.23 1.06 0.66  --  0.83 0.78 0.76  CALCIUM 9.4 9.5 7.7*  --  9.2 9.0  --   MG  --  2.1 1.6* 1.9  --  1.7  --    GFR: Estimated Creatinine Clearance: 107.8 mL/min (by C-G formula based on SCr of 0.76 mg/dL). Recent Labs  Lab 07/30/21 2115 07/30/21 2219 07/30/21 2235 07/31/21 0232 08/01/21 0445 08/03/21 0746 08/04/21 0542 08/05/21 0533 08/06/21 0424  PROCALCITON  --   --   --  3.21  --   --   --   --   --   WBC  --    < >  --  30.7*   < > 25.8* 24.4* 24.5* 23.1*  LATICACIDVEN 1.2  --  2.1*  --   --   --   --   --   --    < > = values in this interval not displayed.    Liver Function Tests: Recent Labs  Lab 07/30/21 0927 07/30/21 1822 07/31/21 0232  AST 20  --  14*  ALT 16  --  14  ALKPHOS 216*  --  139*  BILITOT 0.4  --  0.4  PROT 8.0 7.4  7.0  ALBUMIN 1.9*  --  2.1*   No results for input(s): LIPASE, AMYLASE in the last 168 hours. No results for input(s): AMMONIA in the last 168 hours.  ABG    Component Value Date/Time   HCO3 18.6 (L) 07/30/2021 2235   ACIDBASEDEF 5.5 (H) 07/30/2021 2235   O2SAT 86.8 07/30/2021 2235     Coagulation Profile: Recent Labs  Lab 07/31/21 0232  INR 1.7*  CBG: Recent Labs  Lab 08/05/21 0733 08/05/21 1132 08/05/21 1615 08/05/21 2142 08/06/21 0727  GLUCAP 140* 171* 126* 115* 132*     Baltazar Apo, MD, PhD 08/06/2021, 8:15 AM [facility] [phone] or if no answer before 7:00PM call [phone] For any issues after 7:00PM please call eLink [phone]

## 2021-08-06 NOTE — Progress Notes (Signed)
Nutrition Follow-up  DOCUMENTATION CODES:   Non-severe (moderate) malnutrition in context of chronic illness  INTERVENTION:  - continue Boost Breeze TID and Magic Cup TID. - will order 30 ml Prosource Plus BID, each supplement provides 100 kcal and 15 grams protein.  - weigh patient today.    NUTRITION DIAGNOSIS:   Moderate Malnutrition related to chronic illness, cancer and cancer related treatments as evidenced by percent weight loss, moderate fat depletion, moderate muscle depletion, energy intake < or equal to 75% for > or equal to 1 month. -ongoing  GOAL:   Patient will meet greater than or equal to 90% of their needs -unmet  MONITOR:   PO intake, Supplement acceptance, Labs, Weight trends, I & O's  REASON FOR ASSESSMENT:   Consult Assessment of nutrition requirement/status  ASSESSMENT:   60 year old male with prior history of Stage IIIa NSCLC (SCC) RUL diagnosed 02/2020 (RUL/suprahilar mass and R paratracheal and subcarinal LAD) s/p chemo XRT including keytruda, consolidation IT with durvalumab, SBRT to RUL nodules followed by Dr. Julien Nordmann. He is receiving neulasta support.     Presented to Dr. Julien Nordmann for follow-up, reportedly not felt well for 1-2 weeks, 15 lb weight loss since his prior visit, pain under right breast, worse with cough and progressive dyspnea on exertion even on his home O2.  He has had productive cough, orthopnea that has worsened over the last week.  The most recently documented meal intakes were 0% of breakfast and 50% of lunch on 9/17; 0% of breakfast on 9/18.   He has been accepting Boost Breeze 50-75% of the time offered. Patient will only drink Ensure if chocolate syrup is added to it. He likes a popsicle to be added to [employer].  Patient was sitting in the chair with no family or visitors present. He was very drowsy; reported he recently received pain medication. He drifted in and out of sleep throughout visit.   He states appetite is slowly  improving and that he has been able to eat small amounts of some solid foods (PTA only taking in liquids x4 weeks), but was unable to provide further detail on this.  He states he has been urinating regularly but has only had 1 BM since admission.   Unable to obtain any further nutrition-related information at this time.   He has not been weighed since 9/15. Non-pitting edema to RUE and BLE and mild pitting edema to LUE documented in the edema section of flow sheet.     Labs reviewed; CBGs: 132 and 126 mg/dl. Medications reviewed; 325 mg ferrous sulfate/day, sliding scale novolog, 20 units semglee/day, 75 mcg oral synthroid/day, 1 tablet multivitamin with minerals/day, 40 mg oral protonix/day, 2 tablets senokot/day.   Diet Order:   Diet Order             Diet Carb Modified Fluid consistency: Thin; Room service appropriate? Yes  Diet effective now                   EDUCATION NEEDS:   Education needs have been addressed  Skin:  Skin Assessment: Reviewed RN Assessment  Last BM:  9/17  Height:   Ht Readings from Last 1 Encounters:  07/30/21 6' (1.829 m)    Weight:   Wt Readings from Last 1 Encounters:  07/30/21 88.9 kg     Estimated Nutritional Needs:  Kcal:  2400-2600 Protein:  125-135g Fluid:  >2L/day     William Matin, MS, RD, LDN, CNSC Inpatient Clinical Dietitian  RD pager # available in [facility]  After hours/weekend pager # available in [facility]

## 2021-08-07 DIAGNOSIS — Z7189 Other specified counseling: Secondary | ICD-10-CM | POA: Diagnosis not present

## 2021-08-07 DIAGNOSIS — J869 Pyothorax without fistula: Secondary | ICD-10-CM | POA: Diagnosis not present

## 2021-08-07 DIAGNOSIS — C3411 Malignant neoplasm of upper lobe, right bronchus or lung: Secondary | ICD-10-CM | POA: Diagnosis not present

## 2021-08-07 DIAGNOSIS — Z515 Encounter for palliative care: Secondary | ICD-10-CM | POA: Diagnosis not present

## 2021-08-07 LAB — CBC WITH DIFFERENTIAL/PLATELET
Abs Immature Granulocytes: 0.19 10*3/uL — ABNORMAL HIGH (ref 0.00–0.07)
Basophils Absolute: 0.1 10*3/uL (ref 0.0–0.1)
Basophils Relative: 0 %
Eosinophils Absolute: 0.3 10*3/uL (ref 0.0–0.5)
Eosinophils Relative: 1 %
HCT: 29.7 % — ABNORMAL LOW (ref 39.0–52.0)
Hemoglobin: 9 g/dL — ABNORMAL LOW (ref 13.0–17.0)
Immature Granulocytes: 1 %
Lymphocytes Relative: 4 %
Lymphs Abs: 0.9 10*3/uL (ref 0.7–4.0)
MCH: 28.6 pg (ref 26.0–34.0)
MCHC: 30.3 g/dL (ref 30.0–36.0)
MCV: 94.3 fL (ref 80.0–100.0)
Monocytes Absolute: 1.9 10*3/uL — ABNORMAL HIGH (ref 0.1–1.0)
Monocytes Relative: 8 %
Neutro Abs: 20.9 10*3/uL — ABNORMAL HIGH (ref 1.7–7.7)
Neutrophils Relative %: 86 %
Platelets: 347 10*3/uL (ref 150–400)
RBC: 3.15 MIL/uL — ABNORMAL LOW (ref 4.22–5.81)
RDW: 20.1 % — ABNORMAL HIGH (ref 11.5–15.5)
WBC: 24.2 10*3/uL — ABNORMAL HIGH (ref 4.0–10.5)
nRBC: 0 % (ref 0.0–0.2)

## 2021-08-07 LAB — GLUCOSE, CAPILLARY
Glucose-Capillary: 62 mg/dL — ABNORMAL LOW (ref 70–99)
Glucose-Capillary: 81 mg/dL (ref 70–99)
Glucose-Capillary: 83 mg/dL (ref 70–99)
Glucose-Capillary: 88 mg/dL (ref 70–99)
Glucose-Capillary: 90 mg/dL (ref 70–99)
Glucose-Capillary: 90 mg/dL (ref 70–99)

## 2021-08-07 LAB — BASIC METABOLIC PANEL
Anion gap: 10 (ref 5–15)
BUN: 9 mg/dL (ref 6–20)
CO2: 22 mmol/L (ref 22–32)
Calcium: 8.7 mg/dL — ABNORMAL LOW (ref 8.9–10.3)
Chloride: 104 mmol/L (ref 98–111)
Creatinine, Ser: 0.66 mg/dL (ref 0.61–1.24)
GFR, Estimated: >60 mL/min (ref >60)
Glucose, Bld: 81 mg/dL (ref 70–99)
Potassium: 4 mmol/L (ref 3.5–5.1)
Sodium: 136 mmol/L (ref 135–145)

## 2021-08-07 NOTE — Progress Notes (Signed)
Hypoglycemic Event  CBG: 62  Treatment: 4 oz juice/soda  Symptoms: None  Follow-up CBG: Time:2208 CBG Result:90   Possible Reasons for Event: Inadequate meal intake  Comments/MD notified:MD aware    William Farrell

## 2021-08-07 NOTE — Evaluation (Signed)
Clinical/Bedside Swallow Evaluation Patient Details  Name: Osvaldo Lamping MRN: [id number] Date of Birth: [birth year]  Today's Date: 08/07/2021 Time: SLP Start Time (ACUTE ONLY): 1440 SLP Stop Time (ACUTE ONLY): 8250 SLP Time Calculation (min) (ACUTE ONLY): 18 min  Past Medical History:  Past Medical History:  Diagnosis Date   Asthma    as a child   Diabetes ([place])    Diverticulitis    GERD (gastroesophageal reflux disease)    High cholesterol    History of kidney stones    Lung cancer ([place])    Wears glasses    Past Surgical History:  Past Surgical History:  Procedure Laterality Date   BIOPSY  03/25/2021   Procedure: BIOPSY;  Surgeon: Lavena Bullion, DO;  Location: [facility];  Service: Gastroenterology;;   BIOPSY  06/11/2021   Procedure: BIOPSY;  Surgeon: Lavena Bullion, DO;  Location: [facility];  Service: Gastroenterology;;   broken bone repair     BRONCHIAL BIOPSY  03/11/2020   Procedure: BRONCHIAL BIOPSIES;  Surgeon: Garner Nash, DO;  Location: [place] [facility];  Service: Pulmonary;;   COLONOSCOPY     around [address] [place]    CRYOTHERAPY  03/11/2020   Procedure: CRYOTHERAPY;  Surgeon: Garner Nash, DO;  Location: [place] [facility];  Service: Pulmonary;;   ESOPHAGEAL DILATION  03/25/2021   Procedure: ESOPHAGEAL DILATION;  Surgeon: Lavena Bullion, DO;  Location: [facility];  Service: Gastroenterology;;   ESOPHAGOGASTRODUODENOSCOPY (EGD) WITH PROPOFOL N/A 03/25/2021   Procedure: ESOPHAGOGASTRODUODENOSCOPY (EGD) WITH PROPOFOL;  Surgeon: Lavena Bullion, DO;  Location: [facility];  Service: Gastroenterology;  Laterality: N/A;  dialation of stricture   ESOPHAGOGASTRODUODENOSCOPY (EGD) WITH PROPOFOL N/A 06/11/2021   Procedure: ESOPHAGOGASTRODUODENOSCOPY (EGD) WITH PROPOFOL;  Surgeon: Lavena Bullion, DO;  Location: [facility];  Service: Gastroenterology;  Laterality: N/A;   FINE NEEDLE ASPIRATION  03/11/2020   Procedure: FINE NEEDLE ASPIRATION (FNA)  LINEAR;  Surgeon: Garner Nash, DO;  Location: [place] [facility];  Service: Pulmonary;;   IR IMAGING GUIDED PORT INSERTION  10/16/2020   KNEE ARTHROSCOPY WITH ANTERIOR CRUCIATE LIGAMENT (ACL) REPAIR     x 2   VIDEO BRONCHOSCOPY WITH ENDOBRONCHIAL ULTRASOUND N/A 03/11/2020   Procedure: VIDEO BRONCHOSCOPY WITH ENDOBRONCHIAL ULTRASOUND;  Surgeon: Garner Nash, DO;  Location: [place];  Service: Pulmonary;  Laterality: N/A;   HPI:  Patient is a 60 year old male with history of stage IIIa non-small cell lung cancer currently on chemotherapy, hyperlipidemia, diabetes type 2 who presented here with complaints of fatigue, poor oral intake for 2 weeks before admission.  On presentation he was found to be in A. fib with RVR, DKA and also found to have loculated right-sided pleural effusion.  PCCM was consulted on presentation, status post right chest tube placement.  Currently on broad-spectrum antibiotics for loculated right-sided pleural effusion.    Assessment / Plan / Recommendation  Clinical Impression  Pt demonstrates immediate coughing with large sips of liquid as well as multiple swallows and subjectively restricted hyolaryngeal movement. 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 Pt would benefit from instrumental assessment to determine the severity of pts dysphagia and what modifications can help him reduce his painful coughing and ensure safety with PO. Recommend MBS on 9/25, pills whole in pudding in the meantime (pt hates applesauce) and small sips/careful intake of thin liquids. Pt has not been eating solids at all. SLP Visit Diagnosis: Dysphagia, unspecified (R13.10)  Aspiration Risk  Moderate aspiration risk;Risk for inadequate nutrition/hydration    Diet Recommendation Thin liquid   Liquid Administration via: Cup Medication Administration: Whole meds with  puree Supervision: Patient able to self feed Compensations: Slow rate;Small sips/bites;Multiple dry swallows after each bite/sip Postural Changes: Seated upright at 90 degrees    Other  Recommendations Oral Care Recommendations: Oral care BID    Recommendations for follow up therapy are one component of a multi-disciplinary discharge planning process, led by the attending physician.  Recommendations may be updated based on patient status, additional functional criteria and insurance authorization.  Follow up Recommendations        Frequency and Duration            Prognosis        Swallow Study   General HPI: Patient is a 60 year old male with history of stage IIIa non-small cell lung cancer currently on chemotherapy, hyperlipidemia, diabetes type 2 who presented here with complaints of fatigue, poor oral intake for 2 weeks before admission.  On presentation he was found to be in A. fib with RVR, DKA and also found to have loculated right-sided pleural effusion.  PCCM was consulted on presentation, status post right chest tube placement.  Currently on broad-spectrum antibiotics for loculated right-sided pleural effusion. Type of Study: Bedside Swallow Evaluation Diet Prior to this Study: Regular;Thin liquids Temperature Spikes Noted: No Respiratory Status: Nasal cannula History of Recent Intubation: No Behavior/Cognition: Alert;Cooperative;Pleasant mood Oral Cavity Assessment: Within Functional Limits Oral Care Completed by SLP: No Oral Cavity - Dentition: Adequate natural dentition Vision: Functional for self-feeding Self-Feeding Abilities: Able to feed self Patient Positioning: Upright in bed Baseline Vocal Quality: Normal Volitional Cough: Strong Volitional Swallow: Able to elicit    Oral/Motor/Sensory Function Overall Oral Motor/Sensory Function: Within functional limits   Ice Chips     Thin Liquid Thin Liquid: Impaired Presentation: Cup;Self Fed Pharyngeal  Phase  Impairments: Cough - Immediate;Multiple swallows;Decreased hyoid-laryngeal movement    Nectar Thick Nectar Thick Liquid: Not tested   Honey Thick Honey Thick Liquid: Not tested   Puree Puree: Not tested   Solid     Solid: Not tested      Lynann Beaver 08/07/2021,3:04 PM

## 2021-08-07 NOTE — Progress Notes (Signed)
NAME:  William Farrell, MRN:  [id number], DOB:  07/19/1961, LOS: 8 ADMISSION DATE:  07/30/2021, CONSULTATION DATE:  9/15 REFERRING MD:  Dr Marylyn Ishihara, CHIEF COMPLAINT:  SOB/weakness   History of Present Illness:  60 year old male with prior history of Stage IIIa NSCLC (SCC) RUL diagnosed 02/2020 (RUL/suprahilar mass and R paratracheal and subcarinal LAD) s/p chemo XRT including keytruda, consolidation IT with durvalumab, SBRT to RUL nodules followed by Dr. Julien Nordmann. He is receiving neulasta support.   Presented to Dr. Julien Nordmann for follow-up, reportedly not felt well for 1-2 weeks, 15 lb weight loss since his prior visit, pain under right breast, worse with cough and progressive dyspnea on exertion even on his home O2.  He has had productive cough, orthopnea that has worsened over the last week. He has borrowed his wife's oxygen. A little hemoptysis, but no nausea, vomiting, or diarrhea.  No fever/chills.  He does acknowledge some trouble with aspiration of solids/liquids. Was due to start cycle #12 today.  Pertinent  Medical History  NSCLC stage IIIA (dx 02/2020)  Significant Hospital Events: Including procedures, antibiotic start and stop dates in addition to other pertinent events   Chest tube placement 07/30/2021 Cytology pleural fluid >> no malignant cells Pleural fluid cx > pan-sensitive Strep intermedius  Interim History / Subjective:   Chest tube output / 24h:  130 > 150 > 90 > 110 for 9/22.  Fluid still looks purulent. He is having neck pain, back pain from being stuck in the bed Still no fevers   Objective   Blood pressure 132/75, pulse (!) 111, temperature 97.9 F (36.6 C), temperature source Oral, resp. rate (!) 22, height 6' (1.829 m), weight 91.9 kg, SpO2 (!) 89 %.        Intake/Output Summary (Last 24 hours) at 08/07/2021 0824 Last data filed at 08/07/2021 0600 Gross per 24 hour  Intake 120 ml  Output 810 ml  Net -690 ml   Filed Weights   07/30/21 1101 07/30/21 1133  08/06/21 1255  Weight: 95.4 kg 88.9 kg 91.9 kg    Examination: General: Sitting up in bed, no distress HENT: Some nasal congestion, oropharynx clear Lungs: There is some induration around the chest tube insertion site but no erythema, no drainage.  Tube continues to drain yellow fluid, purulent.  Lungs are coarse bilaterally and decreased to both bases Cardiovascular: Distant, regular, borderline tachycardic Abdomen: Nondistended, positive bowel sounds Extremities: No lower extremity edema Neuro: A&O x3, follows commands, moves all extremities  Resolved Hospital Problem list     Assessment & Plan:   Right Strep intermedius empyema with chest tube in place 9/15.  Continues to have yellow purulent material draining from chest tube, slightly thinner but still purulent Persistent R pneumothorax, trapped lung -Had hoped to possibly remove chest tube 9/23, but increased output, 110 cc /24 h.  Fluid remains purulent and pleural space still complicated with apparent apical trapped lung.  Failure of his lung to fully reexpand may actually be contributing to continued fluid production.  Do not feel comfortable removing with this level of purulent output. -Hopefully he will not require large bore chest tube or surgical evaluation, would like to manage this with pigtail chest tube -Continue saline flushes to ensure chest tube patency -Continued ceftriaxone, day 7 on 9/23.  Consider extended course to 10 days since pleural fluid remains purulent.  Metronidazole stopped on 9/19 -Pain control with chest tube in place -Need to push PT/OT/OOB  Non-small cell lung cancer, currently undergoing  therapy.  Concern for possible progressive disease most recent CT. pleural fluid cytology negative -Current therapy is on hold since he had acute infection.  Appreciate Dr. Worthy Flank assistance  Persistent cough, purulent sputum -Respiratory culture normal flora -Continue nasal saline, nasal steroid.  Hopefully  decreasing his congestion and drainage will help with mucus burden and cough  Atrial fibrillation with RVR, resolved.  Was transient in the setting of acute infection -On enoxaparin prophylactic dose  Diabetes mellitus type 2 -As per [facility] plans   Best Practice (right click and "Reselect all SmartList Selections" daily)   Diet/type: Regular consistency (see orders) DVT prophylaxis:  GI prophylaxis: PPI Lines: N/A Foley:  N/A Code Status:  full code Last date of multidisciplinary goals of care discussion [Per primary] Family: Updated the patient and his wife at bedside on 9/23 Disposition >> he could move to regular floor bed for continued treatment and monitoring of his chest tube, chest tube output  Labs   CBC: Recent Labs  Lab 08/03/21 0746 08/04/21 0542 08/05/21 0533 08/06/21 0424 08/07/21 0347  WBC 25.8* 24.4* 24.5* 23.1* 24.2*  NEUTROABS  --  21.7* 21.7* 20.1* 20.9*  HGB 8.8* 9.1* 9.8* 9.0* 9.0*  HCT 28.4* 30.0* 31.7* 29.6* 29.7*  MCV 91.3 92.0 93.0 94.0 94.3  PLT 366 374 385 349 481    Basic Metabolic Panel: Recent Labs  Lab 08/01/21 0445 08/02/21 0522 08/03/21 0559 08/03/21 0746 08/04/21 0542 08/06/21 0424 08/07/21 0347  NA 143 141  --  138 137  --  136  K 4.4 3.4*  --  4.3 4.5  --  4.0  CL 113* 116*  --  109 106  --  104  CO2 20* 19*  --  21* 23  --  22  GLUCOSE 187* 132*  --  152* 137*  --  81  BUN 41* 23*  --  19 16  --  9  CREATININE 1.06 0.66  --  0.83 0.78 0.76 0.66  CALCIUM 9.5 7.7*  --  9.2 9.0  --  8.7*  MG 2.1 1.6* 1.9  --  1.7  --   --    GFR: Estimated Creatinine Clearance: 107.8 mL/min (by C-G formula based on SCr of 0.66 mg/dL). Recent Labs  Lab 08/04/21 0542 08/05/21 0533 08/06/21 0424 08/07/21 0347  WBC 24.4* 24.5* 23.1* 24.2*    Liver Function Tests: No results for input(s): AST, ALT, ALKPHOS, BILITOT, PROT, ALBUMIN in the last 168 hours.  No results for input(s): LIPASE, AMYLASE in the last 168 hours. No results for  input(s): AMMONIA in the last 168 hours.  ABG    Component Value Date/Time   HCO3 18.6 (L) 07/30/2021 2235   ACIDBASEDEF 5.5 (H) 07/30/2021 2235   O2SAT 86.8 07/30/2021 2235     Coagulation Profile: No results for input(s): INR, PROTIME in the last 168 hours.   CBG: Recent Labs  Lab 08/06/21 0727 08/06/21 1127 08/06/21 1601 08/06/21 2137 08/07/21 0719  GLUCAP 132* 126* 125* 98 81     Baltazar Apo, MD, PhD 08/07/2021, 8:24 AM [facility] [phone] or if no answer before 7:00PM call [phone] For any issues after 7:00PM please call eLink [phone]

## 2021-08-07 NOTE — Progress Notes (Signed)
PROGRESS NOTE    William Farrell  OZH:[id number] DOB: August 03, 1961 DOA: 07/30/2021 PCP: [name], DO   Chief Complain: Fatigue, poor oral intake  Brief Narrative: Patient is a 60 year old male with history of stage IIIa non-small cell lung cancer currently on chemotherapy, hyperlipidemia, diabetes type 2 who presented here with complaints of fatigue, poor oral intake for 2 weeks before admission.  On presentation he was found to be in A. fib with RVR, DKA and also found to have loculated right-sided pleural effusion.  PCCM was consulted on presentation, status post right chest tube placement.  Currently on broad-spectrum antibiotics for loculated right-sided pleural effusion.  Oncology following.  A. fib with RVR has resolved and currently in normal sinus rhythm.  Still on chest tube.  PCCM following  Assessment & Plan:   Principal Problem:   Empyema of right pleural space (HCC) Active Problems:   Type 2 diabetes mellitus without complication, without long-term current use of insulin (HCC)   Malignant neoplasm of right upper lobe of lung (HCC)   Atrial fibrillation with rapid ventricular response (HCC)   Malnutrition of moderate degree   Sepsis secondary to loculated right-sided pleural effusion: Presented with shortness of breath.  Status post chest tube placement on 9/15.  Procalcitonin was elevated.   He was on broad-spectrum antibiotics with vancomycin, cefepime, Flagyl.  Pleural fluid culture showing streptococcus intermedius, pansensitive.  Blood cultures are negative . Antibiotic has been changed to ceftriaxone,day 7.  Sputum culture showing few gram-negative coccobacilli. Cough is better.  Chest tube is still draining significant purulent fluid.The  plan  is to remove the chest tube when the drain is less than 50-100 cc, likely today. Still has significant leukocytosis,but  afebrile.  His leukocytosis most likely secondary to malignancy.  Stage III non-small cell lung  cancer: Follows with Dr. Julien Nordmann, he was being treated with chemo.   Oncology following. Chest CT done on 9.18.22 showed  extensive, diffuse pleural thickening and adjacent interlobular septal thickening with a small, loculated hydropneumothorax, approximately 10% volume,perihilar and suprahilar right lung malignancy with adjacent satellite nodularity,multiple small pulmonary nodules throughout the lungs consistent with worsening pulmonary metastatic disease. Pleural fluid cytology negative for malignancy  A. fib with RVR: Likely transient, now resolved after starting on Cardizem drip.  Currently in normal sinus rhythm.  TSH normal.  Echocardiogram showed EF of 60 to 65%, normal left ventricular diastolic parameters.  Not a started on anticoagulation because of transient nature of the A. fib with RVR/likely related to pulmonary issues.  DKA/diabetes type 2: Initially started on insulin drip now transitioned to subcutaneous insulin.  Takes metformin, glipizide, Ozempic at home.  Monitor blood sugars.  Continue current insulin regimen.  Hemoglobin A1c of 6.6.  AKI on CKD stage IIIa: Creatinine was in the range of 2 on presentation.  Baseline creatinine around 1.3.  Currently kidney function is normal.  Chronic normocytic anemia: Baseline hemoglobin around 8.   No evidence of acute blood loss.  Most likely associated with underlying malignancy, chemotherapy.  Iron studies showed low iron,given IV iron infusion.  Transfused with a unit of PRBC on 08/02/2021.  Hyperlipidemia: On statin  Hypothyroidism: On Synthyroid  GERD: Continue PPI  Hypokalemia/hypomagnesemia: Being monitored and supplemented intermittently  Moderate malnutrition: Secondary to malignancy.  Nutrition following  Debility/deconditioning: PT/OT evaluated the patient and recommend home health on discharge.  Nutrition Problem: Moderate Malnutrition Etiology: chronic illness, cancer and cancer related treatments      DVT  prophylaxis:Lovenox Code Status: Full Family Communication:  Wife present at bedside Status is: Inpatient  Remains inpatient appropriate because:Inpatient level of care appropriate due to severity of illness  Dispo: The patient is from: Home              Anticipated d/c is to: Home              Patient currently is not medically stable to d/c.   Difficult to place patient No    Consultants: Oncology,PCCM  Procedures:Chest tube placement  Antimicrobials:  Anti-infectives (From admission, onward)    Start     Dose/Rate Route Frequency Ordered Stop   08/05/21 1800  cefTRIAXone (ROCEPHIN) 2 g in sodium chloride 0.9 % 100 mL IVPB        2 g 200 mL/hr over 30 Minutes Intravenous Every 24 hours 08/05/21 1316     08/02/21 1800  cefTRIAXone (ROCEPHIN) 2 g in sodium chloride 0.9 % 100 mL IVPB  Status:  Discontinued        2 g 200 mL/hr over 30 Minutes Intravenous Every 24 hours 08/02/21 0947 08/05/21 1316   08/02/21 1100  vancomycin (VANCOREADY) IVPB 1250 mg/250 mL  Status:  Discontinued        1,250 mg 166.7 mL/hr over 90 Minutes Intravenous Every 12 hours 08/02/21 0726 08/02/21 0910   08/01/21 1100  vancomycin (VANCOCIN) IVPB 1000 mg/200 mL premix  Status:  Discontinued        1,000 mg 200 mL/hr over 60 Minutes Intravenous Every 12 hours 08/01/21 0951 08/02/21 0726   07/31/21 1800  vancomycin (VANCOREADY) IVPB 1250 mg/250 mL  Status:  Discontinued        1,250 mg 166.7 mL/hr over 90 Minutes Intravenous Every 24 hours 07/30/21 1911 07/31/21 0845   07/31/21 1800  vancomycin (VANCOREADY) IVPB 1500 mg/300 mL  Status:  Discontinued        1,500 mg 150 mL/hr over 120 Minutes Intravenous Every 24 hours 07/31/21 0845 08/01/21 0951   07/31/21 0945  ceFEPIme (MAXIPIME) 2 g in sodium chloride 0.9 % 100 mL IVPB  Status:  Discontinued        2 g 200 mL/hr over 30 Minutes Intravenous Every 8 hours 07/31/21 0845 08/02/21 0948   07/30/21 2000  metroNIDAZOLE (FLAGYL) IVPB 500 mg  Status:   Discontinued        500 mg 100 mL/hr over 60 Minutes Intravenous Every 12 hours 07/30/21 1738 08/03/21 1019   07/30/21 1830  vancomycin (VANCOREADY) IVPB 1750 mg/350 mL        1,750 mg 175 mL/hr over 120 Minutes Intravenous  Once 07/30/21 1738 07/30/21 1846   07/30/21 1800  ceFEPIme (MAXIPIME) 2 g in sodium chloride 0.9 % 100 mL IVPB  Status:  Discontinued        2 g 200 mL/hr over 30 Minutes Intravenous Every 12 hours 07/30/21 1723 07/31/21 0845       Subjective: Patient seen and examined at the bedside this morning.  Hemodynamically stable.  Complains of weakness but overall feels okay.  Cough is better.  Chest tube still draining purulent fluid  Objective: Vitals:   08/07/21 0300 08/07/21 0400 08/07/21 0500 08/07/21 0600  BP: 115/74 129/87 131/67 132/75  Pulse: (!) 107 (!) 110 (!) 102 (!) 111  Resp: 14 (!) 6 15 (!) 22  Temp:  98.3 F (36.8 C)    TempSrc:  Oral    SpO2: 92% 91% 94% (!) 89%  Weight:      Height:  Intake/Output Summary (Last 24 hours) at 08/07/2021 0729 Last data filed at 08/07/2021 0600 Gross per 24 hour  Intake 120 ml  Output 810 ml  Net -690 ml   Filed Weights   07/30/21 1101 07/30/21 1133 08/06/21 1255  Weight: 95.4 kg 88.9 kg 91.9 kg    Examination: General exam: Not in distress, deconditioned, chronically ill looking HEENT: PERRL Respiratory system:  no wheezes or crackles ,Right-sided chest tube draining purulent material Cardiovascular system: S1 & S2 heard, RRR.   Gastrointestinal system: Abdomen is nondistended, soft and nontender. Central nervous system: Alert and oriented Extremities: No edema, no clubbing ,no cyanosis Skin: No rashes, no ulcers,no icterus    Data Reviewed: I have personally reviewed following labs and imaging studies  CBC: Recent Labs  Lab 08/03/21 0746 08/04/21 0542 08/05/21 0533 08/06/21 0424 08/07/21 0347  WBC 25.8* 24.4* 24.5* 23.1* 24.2*  NEUTROABS  --  21.7* 21.7* 20.1* 20.9*  HGB 8.8* 9.1* 9.8*  9.0* 9.0*  HCT 28.4* 30.0* 31.7* 29.6* 29.7*  MCV 91.3 92.0 93.0 94.0 94.3  PLT 366 374 385 349 161   Basic Metabolic Panel: Recent Labs  Lab 08/01/21 0445 08/02/21 0522 08/03/21 0559 08/03/21 0746 08/04/21 0542 08/06/21 0424 08/07/21 0347  NA 143 141  --  138 137  --  136  K 4.4 3.4*  --  4.3 4.5  --  4.0  CL 113* 116*  --  109 106  --  104  CO2 20* 19*  --  21* 23  --  22  GLUCOSE 187* 132*  --  152* 137*  --  81  BUN 41* 23*  --  19 16  --  9  CREATININE 1.06 0.66  --  0.83 0.78 0.76 0.66  CALCIUM 9.5 7.7*  --  9.2 9.0  --  8.7*  MG 2.1 1.6* 1.9  --  1.7  --   --    GFR: Estimated Creatinine Clearance: 107.8 mL/min (by C-G formula based on SCr of 0.66 mg/dL). Liver Function Tests: No results for input(s): AST, ALT, ALKPHOS, BILITOT, PROT, ALBUMIN in the last 168 hours.  No results for input(s): LIPASE, AMYLASE in the last 168 hours. No results for input(s): AMMONIA in the last 168 hours. Coagulation Profile: No results for input(s): INR, PROTIME in the last 168 hours.  Cardiac Enzymes: No results for input(s): CKTOTAL, CKMB, CKMBINDEX, TROPONINI in the last 168 hours. BNP (last 3 results) No results for input(s): PROBNP in the last 8760 hours. HbA1C: No results for input(s): HGBA1C in the last 72 hours.  CBG: Recent Labs  Lab 08/06/21 0727 08/06/21 1127 08/06/21 1601 08/06/21 2137 08/07/21 0719  GLUCAP 132* 126* 125* 98 81   Lipid Profile: No results for input(s): CHOL, HDL, LDLCALC, TRIG, CHOLHDL, LDLDIRECT in the last 72 hours. Thyroid Function Tests: No results for input(s): TSH, T4TOTAL, FREET4, T3FREE, THYROIDAB in the last 72 hours.  Anemia Panel: No results for input(s): VITAMINB12, FOLATE, FERRITIN, TIBC, IRON, RETICCTPCT in the last 72 hours.  Sepsis Labs: No results for input(s): PROCALCITON, LATICACIDVEN in the last 168 hours.   Recent Results (from the past 240 hour(s))  Resp Panel by RT-PCR (Flu A&B, Covid) Nasopharyngeal Swab      Status: None   Collection Time: 07/30/21 11:19 AM   Specimen: Nasopharyngeal Swab; Nasopharyngeal(NP) swabs in vial transport medium  Result Value Ref Range Status   SARS Coronavirus 2 by RT PCR NEGATIVE NEGATIVE Final    Comment: (NOTE) SARS-CoV-2 target nucleic  acids are NOT DETECTED.  The SARS-CoV-2 RNA is generally detectable in upper respiratory specimens during the acute phase of infection. The lowest concentration of SARS-CoV-2 viral copies this assay can detect is 138 copies/mL. A negative result does not preclude SARS-Cov-2 infection and should not be used as the sole basis for treatment or other patient management decisions. A negative result may occur with  improper specimen collection/handling, submission of specimen other than nasopharyngeal swab, presence of viral mutation(s) within the areas targeted by this assay, and inadequate number of viral copies(<138 copies/mL). A negative result must be combined with clinical observations, patient history, and epidemiological information. The expected result is Negative.  Fact Sheet for Patients:  EntrepreneurPulse.com.au  Fact Sheet for Healthcare Providers:  IncredibleEmployment.be  This test is no t yet approved or cleared by the [place] FDA and  has been authorized for detection and/or diagnosis of SARS-CoV-2 by FDA under an Emergency Use Authorization (EUA). This EUA will remain  in effect (meaning this test can be used) for the duration of the COVID-19 declaration under Section 564(b)(1) of the Act, 21 U.S.C.section 360bbb-3(b)(1), unless the authorization is terminated  or revoked sooner.       Influenza A by PCR NEGATIVE NEGATIVE Final   Influenza B by PCR NEGATIVE NEGATIVE Final    Comment: (NOTE) The Xpert Xpress SARS-CoV-2/FLU/RSV plus assay is intended as an aid in the diagnosis of influenza from Nasopharyngeal swab specimens and should not be used as a sole basis  for treatment. Nasal washings and aspirates are unacceptable for Xpert Xpress SARS-CoV-2/FLU/RSV testing.  Fact Sheet for Patients: EntrepreneurPulse.com.au  Fact Sheet for Healthcare Providers: IncredibleEmployment.be  This test is not yet approved or cleared by the [place] FDA and has been authorized for detection and/or diagnosis of SARS-CoV-2 by FDA under an Emergency Use Authorization (EUA). This EUA will remain in effect (meaning this test can be used) for the duration of the COVID-19 declaration under Section 564(b)(1) of the Act, 21 U.S.C. section 360bbb-3(b)(1), unless the authorization is terminated or revoked.  Performed at [facility], [place] [address], [place], [place] [address]   Body fluid culture w Gram Stain     Status: None   Collection Time: 07/30/21  5:35 PM   Specimen: Pleura; Body Fluid  Result Value Ref Range Status   Specimen Description   Final    PLEURAL RIGHT Performed at [facility] [address] [place], [place] [address]    Special Requests   Final    NONE Performed at [facility], [place] [address], [place], [place] [address]    Gram Stain   Final    ABUNDANT WBC PRESENT,BOTH PMN AND MONONUCLEAR FEW GRAM POSITIVE COCCI Performed at [facility], [place] [address], [place], [place] [address]    Culture ABUNDANT STREPTOCOCCUS INTERMEDIUS  Final   Report Status 08/03/2021 FINAL  Final   Organism ID, Bacteria STREPTOCOCCUS INTERMEDIUS  Final      Susceptibility   Streptococcus intermedius - MIC*    PENICILLIN <=0.06 SENSITIVE Sensitive     CEFTRIAXONE 0.25 SENSITIVE Sensitive     ERYTHROMYCIN <=0.12 SENSITIVE Sensitive     LEVOFLOXACIN 0.5 SENSITIVE Sensitive     VANCOMYCIN 0.5 SENSITIVE Sensitive     * ABUNDANT STREPTOCOCCUS INTERMEDIUS  Culture, blood (routine x 2)     Status: None   Collection Time: 07/30/21 10:35 PM   Specimen: Right  Antecubital; Blood  Result Value Ref Range Status  Specimen Description   Final    RIGHT ANTECUBITAL Performed at [facility] [address] [place], [place] [address]    Special Requests   Final    BOTTLES DRAWN AEROBIC AND ANAEROBIC Blood Culture adequate volume Performed at [facility] [address] [place], [place] [address]    Culture   Final    NO GROWTH 5 DAYS Performed at [facility], [place] [address], [place], [place] [address]    Report Status 08/05/2021 FINAL  Final  Culture, blood (routine x 2)     Status: None   Collection Time: 07/30/21 10:35 PM   Specimen: Left Antecubital; Blood  Result Value Ref Range Status   Specimen Description   Final    LEFT ANTECUBITAL Performed at [facility] [address] [place], [place] [address]    Special Requests   Final    BOTTLES DRAWN AEROBIC AND ANAEROBIC Blood Culture adequate volume Performed at [facility] [address] [place], [place] [address]    Culture   Final    NO GROWTH 5 DAYS Performed at [facility], [place] [address], [place], [place] [address]    Report Status 08/05/2021 FINAL  Final  MRSA Next Gen by PCR, Nasal     Status: None   Collection Time: 07/30/21 11:07 PM   Specimen: Nasal Mucosa; Nasal Swab  Result Value Ref Range Status   MRSA by PCR Next Gen NOT DETECTED NOT DETECTED Final    Comment: (NOTE) The GeneXpert MRSA Assay (FDA approved for NASAL specimens only), is one component of a comprehensive MRSA colonization surveillance program. It is not intended to diagnose MRSA infection nor to guide or monitor treatment for MRSA infections. Test performance is not FDA approved in patients less than 66 years old. Performed at [facility], [place] [address], [place], [place] [address]   Urine Culture     Status: Abnormal   Collection Time: 07/31/21  2:20 PM   Specimen: Urine, Clean Catch   Result Value Ref Range Status   Specimen Description   Final    URINE, CLEAN CATCH Performed at [facility], [place] [address] [place], [place] [address]    Special Requests   Final    NONE Performed at [facility], [place] [address] [place], [place] [address]    Culture (A)  Final    <10,000 COLONIES/mL INSIGNIFICANT GROWTH Performed at [facility] [address] [place], [place] [address]    Report Status 08/02/2021 FINAL  Final  Expectorated Sputum Assessment w Gram Stain, Rflx to Resp Cult     Status: None   Collection Time: 08/02/21 10:15 AM   Specimen: Expectorated Sputum  Result Value Ref Range Status   Specimen Description EXPECTORATED SPUTUM  Final   Special Requests NONE  Final   Sputum evaluation   Final    THIS SPECIMEN IS ACCEPTABLE FOR SPUTUM CULTURE Performed at [facility], [place] [address] [place], [place] [address]    Report Status 08/02/2021 FINAL  Final  Culture, Respiratory w Gram Stain     Status: None   Collection Time: 08/02/21 10:15 AM  Result Value Ref Range Status   Specimen Description   Final    EXPECTORATED SPUTUM Performed at [facility], [place] [address], [place], [place] [address]    Special Requests   Final    NONE Reflexed  from [id number] Performed at [facility], [place] [address], [place], [place] [address]    Gram Stain   Final    FEW WBC PRESENT, PREDOMINANTLY PMN FEW GRAM NEGATIVE COCCOBACILLI    Culture   Final    FEW Normal respiratory flora-no Staph aureus or Pseudomonas seen Performed at [facility] [address] [place], [place] [address]    Report Status 08/04/2021 FINAL  Final         Radiology Studies: DG CHEST PORT 1 VIEW  Result Date: 08/06/2021 CLINICAL DATA:  Chest tube in place EXAM: PORTABLE CHEST 1 VIEW COMPARISON:  Chest x-ray dated July 31, 2021 FINDINGS: Increased size of small right  pneumothorax. Right-sided chest tube in place. Unchanged left chest wall port. Similar heterogeneous opacities at the right lung. No new parenchymal process. No evidence of pleural effusion. Cardiac and mediastinal contours are unchanged. IMPRESSION: Increased size of small right pneumothorax. Right-sided chest tube in place. Electronically Signed   By: Yetta Glassman M.D.   On: 08/06/2021 11:25        Scheduled Meds:  (feeding supplement) PROSource Plus  30 mL Oral BID BM   aspirin EC  81 mg Oral QHS   atorvastatin  40 mg Oral QHS   Chlorhexidine Gluconate Cloth  6 each Topical Daily   chlorpheniramine-HYDROcodone  5 mL Oral Q12H   enoxaparin (LOVENOX) injection  40 mg Subcutaneous Q24H   feeding supplement  1 Container Oral TID BM   ferrous sulfate  325 mg Oral Q breakfast   fluticasone  2 spray Each Nare Daily   guaiFENesin  1,200 mg Oral BID   insulin aspart  0-15 Units Subcutaneous TID WC   insulin aspart  0-5 Units Subcutaneous QHS   insulin glargine-yfgn  20 Units Subcutaneous Daily   levothyroxine  75 mcg Oral Q0600   lidocaine  1 patch Transdermal Daily   mouth rinse  15 mL Mouth Rinse BID   multivitamin with minerals  1 tablet Oral Daily   pantoprazole  40 mg Oral Daily   revefenacin  175 mcg Nebulization Daily   senna-docusate  2 tablet Oral QHS   sodium chloride  2 spray Each Nare BID   sodium chloride flush  10 mL Other Q8H   sodium chloride flush  10-40 mL Intracatheter Q12H   Continuous Infusions:  cefTRIAXone (ROCEPHIN)  IV Stopped (08/06/21 1831)     LOS: 8 days    Time spent:35 mins, More than 50% of that time was spent in counseling and/or coordination of care.      Shelly Coss, MD [facility] P9/23/2022, 7:29 AM

## 2021-08-08 DIAGNOSIS — C3411 Malignant neoplasm of upper lobe, right bronchus or lung: Secondary | ICD-10-CM | POA: Diagnosis not present

## 2021-08-08 DIAGNOSIS — J869 Pyothorax without fistula: Secondary | ICD-10-CM | POA: Diagnosis not present

## 2021-08-08 LAB — GLUCOSE, CAPILLARY
Glucose-Capillary: 70 mg/dL (ref 70–99)
Glucose-Capillary: 77 mg/dL (ref 70–99)
Glucose-Capillary: 134 mg/dL — ABNORMAL HIGH (ref 70–99)
Glucose-Capillary: 121 mg/dL — ABNORMAL HIGH (ref 70–99)
Glucose-Capillary: 97 mg/dL (ref 70–99)

## 2021-08-08 NOTE — Progress Notes (Signed)
NAME:  William Farrell, MRN:  [id number], DOB:  Jan 05, 1961, LOS: 9 ADMISSION DATE:  07/30/2021, CONSULTATION DATE:  9/15 REFERRING MD:  Dr Marylyn Ishihara, CHIEF COMPLAINT:  SOB/weakness   History of Present Illness:  60 year old male with prior history of Stage IIIa NSCLC (SCC) RUL diagnosed 02/2020 (RUL/suprahilar mass and R paratracheal and subcarinal LAD) s/p chemo XRT including keytruda, consolidation IT with durvalumab, SBRT to RUL nodules followed by Dr. Julien Nordmann. He is receiving neulasta support.   Presented to Dr. Julien Nordmann for follow-up, reportedly not felt well for 1-2 weeks, 15 lb weight loss since his prior visit, pain under right breast, worse with cough and progressive dyspnea on exertion even on his home O2.  He has had productive cough, orthopnea that has worsened over the last week. He has borrowed his wife's oxygen. A little hemoptysis, but no nausea, vomiting, or diarrhea.  No fever/chills.  He does acknowledge some trouble with aspiration of solids/liquids. Was due to start cycle #12 today.  Pertinent  Medical History  NSCLC stage IIIA (dx 02/2020)  Significant Hospital Events: Including procedures, antibiotic start and stop dates in addition to other pertinent events   Chest tube placement 07/30/2021 Cytology pleural fluid >> no malignant cells Pleural fluid cx > pan-sensitive Strep intermedius  ABX: Max 9/15 -18 Vanc9/15 -16 Flagyl 9/15-18 Roceph 9/18 >>>   Scheduled Meds:  (feeding supplement) PROSource Plus  30 mL Oral BID BM   aspirin EC  81 mg Oral QHS   atorvastatin  40 mg Oral QHS   Chlorhexidine Gluconate Cloth  6 each Topical Daily   chlorpheniramine-HYDROcodone  5 mL Oral Q12H   enoxaparin (LOVENOX) injection  40 mg Subcutaneous Q24H   feeding supplement  1 Container Oral TID BM   ferrous sulfate  325 mg Oral Q breakfast   fluticasone  2 spray Each Nare Daily   guaiFENesin  1,200 mg Oral BID   insulin aspart  0-15 Units Subcutaneous TID WC   insulin aspart   0-5 Units Subcutaneous QHS   levothyroxine  75 mcg Oral Q0600   lidocaine  1 patch Transdermal Daily   mouth rinse  15 mL Mouth Rinse BID   multivitamin with minerals  1 tablet Oral Daily   pantoprazole  40 mg Oral Daily   revefenacin  175 mcg Nebulization Daily   senna-docusate  2 tablet Oral QHS   sodium chloride  2 spray Each Nare BID   sodium chloride flush  10 mL Other Q8H   sodium chloride flush  10-40 mL Intracatheter Q12H   Continuous Infusions:  cefTRIAXone (ROCEPHIN)  IV Stopped (08/07/21 1925)   PRN Meds:.acetaminophen **OR** acetaminophen, dextrose, hydrALAZINE, HYDROmorphone (DILAUDID) injection, ipratropium-albuterol, loratadine, methocarbamol, metoprolol tartrate, ondansetron **OR** ondansetron (ZOFRAN) IV, oxyCODONE-acetaminophen, phenol, sodium chloride flush, traZODone   Interim History / Subjective:  Still draining pus from chest tube     Objective   Blood pressure (!) 94/48, pulse 96, temperature 97.7 F (36.5 C), temperature source Oral, resp. rate (!) 2, height 6' (1.829 m), weight 91.9 kg, SpO2 96 %.        Intake/Output Summary (Last 24 hours) at 08/08/2021 0928 Last data filed at 08/08/2021 0700 Gross per 24 hour  Intake 160 ml  Output 420 ml  Net -260 ml   Filed Weights   07/30/21 1101 07/30/21 1133 08/06/21 1255  Weight: 95.4 kg 88.9 kg 91.9 kg    Examination:  Tmax  98.3  General appearance:    elderly wm comfortable at  30 degrees hob  At Rest 02 sats  95% on RA  No jvd Oropharynx clear,  mucosa nl Neck supple Lungs with a few scattered exp > insp rhonchi bilaterally RRR no s3 or or sign murmur Abd soft nl excursion  Extr warm with no edema or clubbing noted Neuro  Sensorium intact,  no apparent motor deficits     Resolved Hospital Problem list     Assessment & Plan:   Right Strep intermedius empyema with chest tube in place 9/15.  Continues to have yellow purulent material draining from chest tube, slightly thinner but still  purulent Persistent R pneumothorax, trapped lung -Continue saline flushes to ensure chest tube patency -Pain control with chest tube in place -Need to push PT/OT/OOB - abx as per flowsheet above   Non-small cell lung cancer, currently undergoing therapy.  Concern for possible progressive disease most recent CT. pleural fluid cytology negative -Current therapy is on hold since he had acute infection.  Appreciate Dr. Worthy Flank assistance  Persistent cough, purulent sputum -Respiratory culture normal flora -Continue nasal saline, nasal steroid.  Hopefully decreasing his congestion and drainage will help with mucus burden and cough  Atrial fibrillation with RVR, resolved.  Was transient in the setting of acute infection -On enoxaparin prophylactic dose  Diabetes mellitus type 2 -As per [facility] plans   Best Practice (right click and "Reselect all SmartList Selections" daily)   Diet/type: Regular consistency (see orders) DVT prophylaxis:  GI prophylaxis: PPI Lines: N/A Foley:  N/A Code Status:  full code Last date of multidisciplinary goals of care discussion [Per primary] Family: Updated the patient and his wife at bedside  9/24 am Disposition >> agree can go to floor bed   Labs   CBC: Recent Labs  Lab 08/03/21 0746 08/04/21 0542 08/05/21 0533 08/06/21 0424 08/07/21 0347  WBC 25.8* 24.4* 24.5* 23.1* 24.2*  NEUTROABS  --  21.7* 21.7* 20.1* 20.9*  HGB 8.8* 9.1* 9.8* 9.0* 9.0*  HCT 28.4* 30.0* 31.7* 29.6* 29.7*  MCV 91.3 92.0 93.0 94.0 94.3  PLT 366 374 385 349 027    Basic Metabolic Panel: Recent Labs  Lab 08/02/21 0522 08/03/21 0559 08/03/21 0746 08/04/21 0542 08/06/21 0424 08/07/21 0347  NA 141  --  138 137  --  136  K 3.4*  --  4.3 4.5  --  4.0  CL 116*  --  109 106  --  104  CO2 19*  --  21* 23  --  22  GLUCOSE 132*  --  152* 137*  --  81  BUN 23*  --  19 16  --  9  CREATININE 0.66  --  0.83 0.78 0.76 0.66  CALCIUM 7.7*  --  9.2 9.0  --  8.7*  MG 1.6* 1.9   --  1.7  --   --    GFR: Estimated Creatinine Clearance: 107.8 mL/min (by C-G formula based on SCr of 0.66 mg/dL). Recent Labs  Lab 08/04/21 0542 08/05/21 0533 08/06/21 0424 08/07/21 0347  WBC 24.4* 24.5* 23.1* 24.2*    Liver Function Tests: No results for input(s): AST, ALT, ALKPHOS, BILITOT, PROT, ALBUMIN in the last 168 hours.  No results for input(s): LIPASE, AMYLASE in the last 168 hours. No results for input(s): AMMONIA in the last 168 hours.  ABG    Component Value Date/Time   HCO3 18.6 (L) 07/30/2021 2235   ACIDBASEDEF 5.5 (H) 07/30/2021 2235   O2SAT 86.8 07/30/2021 2235     Coagulation Profile:  No results for input(s): INR, PROTIME in the last 168 hours.   CBG: Recent Labs  Lab 08/07/21 2144 08/07/21 2208 08/07/21 2330 08/08/21 0448 08/08/21 0734  GLUCAP 62* 90 83 70 77      Christinia Gully, MD Pulmonary and [place] [phone]   After 7:00 pm call Elink  [phone]

## 2021-08-08 NOTE — Progress Notes (Signed)
Occupational Therapy Treatment Patient Details Name: William Farrell MRN: [id number] DOB: June 28, 1961 Today's Date: 08/08/2021   History of present illness Pt is 60 year old with history of stage IIIa non-small cell lung cancer on chemo who presents to the hospital for fatigue and poor oral intake for the past 2 weeks on 07/30/21.  He was found to be in atrial fibrillation with RVR, DKA and had loculated pleural effusion. Chest tube was inserted 07/30/2021. Pt with other hx of HLD and DM2   OT comments  Patient agreeable to prearranged therapy. Patient supervision for bed mobility. Appears more fatigued today but more expressive. He was able to do do 2 short bouts of 3 feet forward and back with rest break between and then transfer to recliner. He washed his face and therapist provided him a squeeze ball for his left hand and lower arm that is swollen. Therapist provided him with gentle encouragement to get out of bed more - to at least stand and march in place and up to chair multiple times a day. Patient is frustrated with the leads and CT tube limiting his mobility. Some progress today in activity tolerance but he appears weaker than evaluation. Cont POC.   Recommendations for follow up therapy are one component of a multi-disciplinary discharge planning process, led by the attending physician.  Recommendations may be updated based on patient status, additional functional criteria and insurance authorization.    Follow Up Recommendations  No OT follow up    Equipment Recommendations  Other (comment)    Recommendations for Other Services      Precautions / Restrictions Precautions Precautions: Fall Precaution Comments: R CT to wall suction Restrictions Weight Bearing Restrictions: No       Mobility Bed Mobility Overal bed mobility: Needs Assistance Bed Mobility: Supine to Sit     Supine to sit: Supervision;HOB elevated     General bed mobility comments: supervision to  transfer to edge of bed - assistance for lines/leads/CT    Transfers Overall transfer level: Needs assistance Equipment used: Rolling walker (2 wheeled) Transfers: Sit to/from Stand Sit to Stand: Min guard Stand pivot transfers: Min guard       General transfer comment: Limited distance due to IV, leads and CT to suction. Ambulated 3 feet forward and back x 2 with RW to stabilize - rest break betwee bouts. Then took steps to recliner.    Balance Overall balance assessment: Needs assistance Sitting-balance support: Feet supported;No upper extremity supported Sitting balance-Leahy Scale: Good     Standing balance support: Bilateral upper extremity supported Standing balance-Leahy Scale: Fair Standing balance comment: use of walker for safety                           ADL either performed or assessed with clinical judgement   ADL Overall ADL's : Needs assistance/impaired     Grooming: Set up;Sitting;Wash/dry face Grooming Details (indicate cue type and reason): washed face in recilner                                     Vision Patient Visual Report: No change from baseline     Perception     Praxis      Cognition Arousal/Alertness: Awake/alert Behavior During Therapy: WFL for tasks assessed/performed Overall Cognitive Status: Within Functional Limits for tasks assessed  Exercises     Shoulder Instructions       General Comments      Pertinent Vitals/ Pain       Pain Assessment: Faces Faces Pain Scale: Hurts little more Pain Location: chest and back Pain Descriptors / Indicators: Grimacing;Discomfort Pain Intervention(s): Monitored during session;Limited activity within patient's tolerance  Home Living                                          Prior Functioning/Environment              Frequency  Min 2X/week        Progress Toward Goals  OT  Goals(current goals can now be found in the care plan section)  Progress towards OT goals: Progressing toward goals  Acute Rehab OT Goals Patient Stated Goal: return home; walk more; get chest tube out OT Goal Formulation: With patient Time For Goal Achievement: 08/14/21 Potential to Achieve Goals: [place] Discharge plan remains appropriate    Co-evaluation                 AM-PAC OT "6 Clicks" Daily Activity     Outcome Measure   Help from another person eating meals?: None Help from another person taking care of personal grooming?: A Little Help from another person toileting, which includes using toliet, bedpan, or urinal?: A Little Help from another person bathing (including washing, rinsing, drying)?: A Lot Help from another person to put on and taking off regular upper body clothing?: A Little Help from another person to put on and taking off regular lower body clothing?: A Lot 6 Click Score: 17    End of Session Equipment Utilized During Treatment: Rolling walker  OT Visit Diagnosis: Muscle weakness (generalized) (M62.81);Pain   Activity Tolerance Patient limited by fatigue   Patient Left in chair;with call bell/phone within reach;with family/visitor present   Nurse Communication  (okay to see)        Time: 1405-1430 OT Time Calculation (min): 25 min  Charges: OT General Charges $OT Visit: 1 Visit OT Treatments $Therapeutic Activity: 23-37 mins  Talani Brazee, OTR/L [place]  Office [phone] Pager: [place] 08/08/2021, 2:48 PM

## 2021-08-08 NOTE — Progress Notes (Signed)
PROGRESS NOTE    William Farrell  BTD:[id number] DOB: 1961-08-14 DOA: 07/30/2021 PCP: [name], DO   Chief Complain: Fatigue, poor oral intake  Brief Narrative: Patient is a 60 year old male with history of stage IIIa non-small cell lung cancer currently on chemotherapy, hyperlipidemia, diabetes type 2 who presented here with complaints of fatigue, poor oral intake for 2 weeks before admission.  On presentation he was found to be in A. fib with RVR, DKA and also found to have loculated right-sided pleural effusion.  PCCM was consulted on presentation, status post right chest tube placement.  Currently on broad-spectrum antibiotics for loculated right-sided pleural effusion.  Oncology following.  A. fib with RVR has resolved and currently in normal sinus rhythm.  Still on chest tube.  PCCM following  Assessment & Plan:   Principal Problem:   Empyema of right pleural space (HCC) Active Problems:   Type 2 diabetes mellitus without complication, without long-term current use of insulin (HCC)   Malignant neoplasm of right upper lobe of lung (HCC)   Atrial fibrillation with rapid ventricular response (HCC)   Malnutrition of moderate degree   Sepsis secondary to loculated right-sided pleural effusion: Presented with shortness of breath.  Status post chest tube placement on 9/15.  Procalcitonin was elevated.   He was on broad-spectrum antibiotics with vancomycin, cefepime, Flagyl.  Pleural fluid culture showing streptococcus intermedius, pansensitive.  Blood cultures are negative . Antibiotic has been changed to ceftriaxone,day 7.  Sputum culture showing few gram-negative coccobacilli. Cough is better.  Chest tube is still draining significant purulent fluid.The  plan  is to remove the chest tube when the drain is less than 50-100 cc, likely today. Still has significant leukocytosis,but  afebrile.  His leukocytosis most likely secondary to malignancy. Last follow-up chest x-ray showed  increased in  size of a small right-sided pneumothorax  Stage III non-small cell lung cancer: Follows with Dr. Julien Nordmann, he was being treated with chemo.   Oncology following. Chest CT done on 9.18.22 showed  extensive, diffuse pleural thickening and adjacent interlobular septal thickening with a small, loculated hydropneumothorax, approximately 10% volume,perihilar and suprahilar right lung malignancy with adjacent satellite nodularity,multiple small pulmonary nodules throughout the lungs consistent with worsening pulmonary metastatic disease. Pleural fluid cytology negative for malignancy  A. fib with RVR: Likely transient, now resolved after starting on Cardizem drip.  Currently in normal sinus rhythm.  TSH normal.  Echocardiogram showed EF of 60 to 65%, normal left ventricular diastolic parameters.  Not a started on anticoagulation because of transient nature of the A. fib with RVR/likely related to pulmonary issues.  DKA/diabetes type 2: Initially started on insulin drip now transitioned to subcutaneous insulin.  Takes metformin, glipizide, Ozempic at home.  Monitor blood sugars.  Continue current insulin regimen.  Hemoglobin A1c of 6.6.  Sugars are running low so Lantus discontinued.  AKI on CKD stage IIIa: Creatinine was in the range of 2 on presentation.  Baseline creatinine around 1.3.  Currently kidney function is normal.  Chronic normocytic anemia: Baseline hemoglobin around 8.   No evidence of acute blood loss.  Most likely associated with underlying malignancy, chemotherapy.  Iron studies showed low iron,given IV iron infusion.  Transfused with a unit of PRBC on 08/02/2021.  Hyperlipidemia: On statin  Hypothyroidism: On Synthyroid  GERD: Continue PPI  Hypokalemia/hypomagnesemia: Being monitored and supplemented intermittently  Moderate malnutrition: Secondary to malignancy.  Nutrition following  Debility/deconditioning: PT/OT evaluated the patient and recommend home health on  discharge.  Nutrition Problem:  Moderate Malnutrition Etiology: chronic illness, cancer and cancer related treatments      DVT prophylaxis:Lovenox Code Status: Full Family Communication: Wife present at bedside Status is: Inpatient  Remains inpatient appropriate because:Inpatient level of care appropriate due to severity of illness  Dispo: The patient is from: Home              Anticipated d/c is to: Home              Patient currently is not medically stable to d/c.   Difficult to place patient No    Consultants: Oncology,PCCM  Procedures:Chest tube placement  Antimicrobials:  Anti-infectives (From admission, onward)    Start     Dose/Rate Route Frequency Ordered Stop   08/05/21 1800  cefTRIAXone (ROCEPHIN) 2 g in sodium chloride 0.9 % 100 mL IVPB        2 g 200 mL/hr over 30 Minutes Intravenous Every 24 hours 08/05/21 1316     08/02/21 1800  cefTRIAXone (ROCEPHIN) 2 g in sodium chloride 0.9 % 100 mL IVPB  Status:  Discontinued        2 g 200 mL/hr over 30 Minutes Intravenous Every 24 hours 08/02/21 0947 08/05/21 1316   08/02/21 1100  vancomycin (VANCOREADY) IVPB 1250 mg/250 mL  Status:  Discontinued        1,250 mg 166.7 mL/hr over 90 Minutes Intravenous Every 12 hours 08/02/21 0726 08/02/21 0910   08/01/21 1100  vancomycin (VANCOCIN) IVPB 1000 mg/200 mL premix  Status:  Discontinued        1,000 mg 200 mL/hr over 60 Minutes Intravenous Every 12 hours 08/01/21 0951 08/02/21 0726   07/31/21 1800  vancomycin (VANCOREADY) IVPB 1250 mg/250 mL  Status:  Discontinued        1,250 mg 166.7 mL/hr over 90 Minutes Intravenous Every 24 hours 07/30/21 1911 07/31/21 0845   07/31/21 1800  vancomycin (VANCOREADY) IVPB 1500 mg/300 mL  Status:  Discontinued        1,500 mg 150 mL/hr over 120 Minutes Intravenous Every 24 hours 07/31/21 0845 08/01/21 0951   07/31/21 0945  ceFEPIme (MAXIPIME) 2 g in sodium chloride 0.9 % 100 mL IVPB  Status:  Discontinued        2 g 200 mL/hr over 30  Minutes Intravenous Every 8 hours 07/31/21 0845 08/02/21 0948   07/30/21 2000  metroNIDAZOLE (FLAGYL) IVPB 500 mg  Status:  Discontinued        500 mg 100 mL/hr over 60 Minutes Intravenous Every 12 hours 07/30/21 1738 08/03/21 1019   07/30/21 1830  vancomycin (VANCOREADY) IVPB 1750 mg/350 mL        1,750 mg 175 mL/hr over 120 Minutes Intravenous  Once 07/30/21 1738 07/30/21 1846   07/30/21 1800  ceFEPIme (MAXIPIME) 2 g in sodium chloride 0.9 % 100 mL IVPB  Status:  Discontinued        2 g 200 mL/hr over 30 Minutes Intravenous Every 12 hours 07/30/21 1723 07/31/21 0845       Subjective: Patient seen and examined at the bedside this morning.  Medically stable.  He was not happy because his sugars were low since yesterday.  Overall looks comfortable, still has some mild cough.  Maintaining his saturation on room air.  Objective: Vitals:   08/08/21 0000 08/08/21 0200 08/08/21 0344 08/08/21 0400  BP: 121/70 124/75  (!) 94/48  Pulse: (!) 106 (!) 129  96  Resp: 18 (!) 22  (!) 2  Temp: 98.1 F (36.7 C)  97.9 F (36.6 C) 97.9 F (36.6 C)  TempSrc: Oral  Oral Oral  SpO2: 96% 97%  95%  Weight:      Height:        Intake/Output Summary (Last 24 hours) at 08/08/2021 0725 Last data filed at 08/08/2021 0457 Gross per 24 hour  Intake 160 ml  Output 380 ml  Net -220 ml   Filed Weights   07/30/21 1101 07/30/21 1133 08/06/21 1255  Weight: 95.4 kg 88.9 kg 91.9 kg    Examination: General exam: Overall comfortable, not in distress, chronically ill looking, weak HEENT: PERRL Respiratory system:  no wheezes or crackles, chest tube in the right side draining purulent fluid Cardiovascular system: Sinus tachycardia Gastrointestinal system: Abdomen is nondistended, soft and nontender. Central nervous system: Alert and oriented Extremities: No edema, no clubbing ,no cyanosis Skin: No rashes, no ulcers,no icterus     Data Reviewed: I have personally reviewed following labs and imaging  studies  CBC: Recent Labs  Lab 08/03/21 0746 08/04/21 0542 08/05/21 0533 08/06/21 0424 08/07/21 0347  WBC 25.8* 24.4* 24.5* 23.1* 24.2*  NEUTROABS  --  21.7* 21.7* 20.1* 20.9*  HGB 8.8* 9.1* 9.8* 9.0* 9.0*  HCT 28.4* 30.0* 31.7* 29.6* 29.7*  MCV 91.3 92.0 93.0 94.0 94.3  PLT 366 374 385 349 536   Basic Metabolic Panel: Recent Labs  Lab 08/02/21 0522 08/03/21 0559 08/03/21 0746 08/04/21 0542 08/06/21 0424 08/07/21 0347  NA 141  --  138 137  --  136  K 3.4*  --  4.3 4.5  --  4.0  CL 116*  --  109 106  --  104  CO2 19*  --  21* 23  --  22  GLUCOSE 132*  --  152* 137*  --  81  BUN 23*  --  19 16  --  9  CREATININE 0.66  --  0.83 0.78 0.76 0.66  CALCIUM 7.7*  --  9.2 9.0  --  8.7*  MG 1.6* 1.9  --  1.7  --   --    GFR: Estimated Creatinine Clearance: 107.8 mL/min (by C-G formula based on SCr of 0.66 mg/dL). Liver Function Tests: No results for input(s): AST, ALT, ALKPHOS, BILITOT, PROT, ALBUMIN in the last 168 hours.  No results for input(s): LIPASE, AMYLASE in the last 168 hours. No results for input(s): AMMONIA in the last 168 hours. Coagulation Profile: No results for input(s): INR, PROTIME in the last 168 hours.  Cardiac Enzymes: No results for input(s): CKTOTAL, CKMB, CKMBINDEX, TROPONINI in the last 168 hours. BNP (last 3 results) No results for input(s): PROBNP in the last 8760 hours. HbA1C: No results for input(s): HGBA1C in the last 72 hours.  CBG: Recent Labs  Lab 08/07/21 1603 08/07/21 2144 08/07/21 2208 08/07/21 2330 08/08/21 0448  GLUCAP 88 62* 90 83 70   Lipid Profile: No results for input(s): CHOL, HDL, LDLCALC, TRIG, CHOLHDL, LDLDIRECT in the last 72 hours. Thyroid Function Tests: No results for input(s): TSH, T4TOTAL, FREET4, T3FREE, THYROIDAB in the last 72 hours.  Anemia Panel: No results for input(s): VITAMINB12, FOLATE, FERRITIN, TIBC, IRON, RETICCTPCT in the last 72 hours.  Sepsis Labs: No results for input(s): PROCALCITON,  LATICACIDVEN in the last 168 hours.   Recent Results (from the past 240 hour(s))  Resp Panel by RT-PCR (Flu A&B, Covid) Nasopharyngeal Swab     Status: None   Collection Time: 07/30/21 11:19 AM   Specimen: Nasopharyngeal Swab; Nasopharyngeal(NP) swabs in vial transport medium  Result Value Ref Range Status   SARS Coronavirus 2 by RT PCR NEGATIVE NEGATIVE Final    Comment: (NOTE) SARS-CoV-2 target nucleic acids are NOT DETECTED.  The SARS-CoV-2 RNA is generally detectable in upper respiratory specimens during the acute phase of infection. The lowest concentration of SARS-CoV-2 viral copies this assay can detect is 138 copies/mL. A negative result does not preclude SARS-Cov-2 infection and should not be used as the sole basis for treatment or other patient management decisions. A negative result may occur with  improper specimen collection/handling, submission of specimen other than nasopharyngeal swab, presence of viral mutation(s) within the areas targeted by this assay, and inadequate number of viral copies(<138 copies/mL). A negative result must be combined with clinical observations, patient history, and epidemiological information. The expected result is Negative.  Fact Sheet for Patients:  EntrepreneurPulse.com.au  Fact Sheet for Healthcare Providers:  IncredibleEmployment.be  This test is no t yet approved or cleared by the [place] FDA and  has been authorized for detection and/or diagnosis of SARS-CoV-2 by FDA under an Emergency Use Authorization (EUA). This EUA will remain  in effect (meaning this test can be used) for the duration of the COVID-19 declaration under Section 564(b)(1) of the Act, 21 U.S.C.section 360bbb-3(b)(1), unless the authorization is terminated  or revoked sooner.       Influenza A by PCR NEGATIVE NEGATIVE Final   Influenza B by PCR NEGATIVE NEGATIVE Final    Comment: (NOTE) The Xpert Xpress  SARS-CoV-2/FLU/RSV plus assay is intended as an aid in the diagnosis of influenza from Nasopharyngeal swab specimens and should not be used as a sole basis for treatment. Nasal washings and aspirates are unacceptable for Xpert Xpress SARS-CoV-2/FLU/RSV testing.  Fact Sheet for Patients: EntrepreneurPulse.com.au  Fact Sheet for Healthcare Providers: IncredibleEmployment.be  This test is not yet approved or cleared by the [place] FDA and has been authorized for detection and/or diagnosis of SARS-CoV-2 by FDA under an Emergency Use Authorization (EUA). This EUA will remain in effect (meaning this test can be used) for the duration of the COVID-19 declaration under Section 564(b)(1) of the Act, 21 U.S.C. section 360bbb-3(b)(1), unless the authorization is terminated or revoked.  Performed at [facility], [place] [address] [place], [place] [address]   Body fluid culture w Gram Stain     Status: None   Collection Time: 07/30/21  5:35 PM   Specimen: Pleura; Body Fluid  Result Value Ref Range Status   Specimen Description   Final    PLEURAL RIGHT Performed at [place] [address] [place], [place] [address]    Special Requests   Final    NONE Performed at [facility], [place] [address], [place], [place] [address]    Gram Stain   Final    ABUNDANT WBC PRESENT,BOTH PMN AND MONONUCLEAR FEW GRAM POSITIVE COCCI Performed at [facility], [place] [address] [place], [place] [address]    Culture ABUNDANT STREPTOCOCCUS INTERMEDIUS  Final   Report Status 08/03/2021 FINAL  Final   Organism ID, Bacteria STREPTOCOCCUS INTERMEDIUS  Final      Susceptibility   Streptococcus intermedius - MIC*    PENICILLIN <=0.06 SENSITIVE Sensitive     CEFTRIAXONE 0.25 SENSITIVE Sensitive     ERYTHROMYCIN <=0.12 SENSITIVE Sensitive     LEVOFLOXACIN 0.5 SENSITIVE Sensitive     VANCOMYCIN 0.5 SENSITIVE  Sensitive     * ABUNDANT STREPTOCOCCUS INTERMEDIUS  Culture, blood (routine x 2)  Status: None   Collection Time: 07/30/21 10:35 PM   Specimen: Right Antecubital; Blood  Result Value Ref Range Status   Specimen Description   Final    RIGHT ANTECUBITAL Performed at [place] [address], [place], [place] [address]    Special Requests   Final    BOTTLES DRAWN AEROBIC AND ANAEROBIC Blood Culture adequate volume Performed at [place] [address] [place], [place] [address]    Culture   Final    NO GROWTH 5 DAYS Performed at [facility], [place] [address] [place], [place] [address]    Report Status 08/05/2021 FINAL  Final  Culture, blood (routine x 2)     Status: None   Collection Time: 07/30/21 10:35 PM   Specimen: Left Antecubital; Blood  Result Value Ref Range Status   Specimen Description   Final    LEFT ANTECUBITAL Performed at [place] [address], [place], [place] [address]    Special Requests   Final    BOTTLES DRAWN AEROBIC AND ANAEROBIC Blood Culture adequate volume Performed at [place] [address] [place], [place] [address]    Culture   Final    NO GROWTH 5 DAYS Performed at [facility], [place] [address] [place], [place] [address]    Report Status 08/05/2021 FINAL  Final  MRSA Next Gen by PCR, Nasal     Status: None   Collection Time: 07/30/21 11:07 PM   Specimen: Nasal Mucosa; Nasal Swab  Result Value Ref Range Status   MRSA by PCR Next Gen NOT DETECTED NOT DETECTED Final    Comment: (NOTE) The GeneXpert MRSA Assay (FDA approved for NASAL specimens only), is one component of a comprehensive MRSA colonization surveillance program. It is not intended to diagnose MRSA infection nor to guide or monitor treatment for MRSA infections. Test performance is not FDA approved in patients less than 57 years old. Performed at [facility], [place] [address] [place], [place] [address]   Urine Culture     Status: Abnormal   Collection Time: 07/31/21  2:20 PM   Specimen: Urine, Clean Catch  Result Value Ref Range Status   Specimen Description   Final    URINE, CLEAN CATCH Performed at [facility], [place] [address] [place], [place] [address]    Special Requests   Final    NONE Performed at [facility], [place] [address], [place], [place] [address]    Culture (A)  Final    <10,000 COLONIES/mL INSIGNIFICANT GROWTH Performed at [place] [address], [place], [place] [address]    Report Status 08/02/2021 FINAL  Final  Expectorated Sputum Assessment w Gram Stain, Rflx to Resp Cult     Status: None   Collection Time: 08/02/21 10:15 AM   Specimen: Expectorated Sputum  Result Value Ref Range Status   Specimen Description EXPECTORATED SPUTUM  Final   Special Requests NONE  Final   Sputum evaluation   Final    THIS SPECIMEN IS ACCEPTABLE FOR SPUTUM CULTURE Performed at [facility], [place] [address] [place],  [address]    Report Status 08/02/2021 FINAL  Final  Culture, Respiratory w Gram Stain     Status: None   Collection Time: 08/02/21 10:15 AM  Result Value Ref Range Status   Specimen Description   Final    EXPECTORATED SPUTUM Performed at [facility]  [place] [address], [place], [place] [address]    Special Requests   Final    NONE Reflexed from [phone] Performed at [facility], [place] [address] [place], [place] [address]    Gram Stain   Final    FEW WBC PRESENT, PREDOMINANTLY PMN FEW GRAM NEGATIVE COCCOBACILLI    Culture   Final    FEW Normal respiratory flora-no Staph aureus or Pseudomonas seen Performed at [place] [address] [place], [place] [address]    Report Status 08/04/2021 FINAL  Final         Radiology Studies: DG CHEST PORT 1 VIEW  Result Date:  08/06/2021 CLINICAL DATA:  Chest tube in place EXAM: PORTABLE CHEST 1 VIEW COMPARISON:  Chest x-ray dated July 31, 2021 FINDINGS: Increased size of small right pneumothorax. Right-sided chest tube in place. Unchanged left chest wall port. Similar heterogeneous opacities at the right lung. No new parenchymal process. No evidence of pleural effusion. Cardiac and mediastinal contours are unchanged. IMPRESSION: Increased size of small right pneumothorax. Right-sided chest tube in place. Electronically Signed   By: Yetta Glassman M.D.   On: 08/06/2021 11:25        Scheduled Meds:  (feeding supplement) PROSource Plus  30 mL Oral BID BM   aspirin EC  81 mg Oral QHS   atorvastatin  40 mg Oral QHS   Chlorhexidine Gluconate Cloth  6 each Topical Daily   chlorpheniramine-HYDROcodone  5 mL Oral Q12H   enoxaparin (LOVENOX) injection  40 mg Subcutaneous Q24H   feeding supplement  1 Container Oral TID BM   ferrous sulfate  325 mg Oral Q breakfast   fluticasone  2 spray Each Nare Daily   guaiFENesin  1,200 mg Oral BID   insulin aspart  0-15 Units Subcutaneous TID WC   insulin aspart  0-5 Units Subcutaneous QHS   insulin glargine-yfgn  20 Units Subcutaneous Daily   levothyroxine  75 mcg Oral Q0600   lidocaine  1 patch Transdermal Daily   mouth rinse  15 mL Mouth Rinse BID   multivitamin with minerals  1 tablet Oral Daily   pantoprazole  40 mg Oral Daily   revefenacin  175 mcg Nebulization Daily   senna-docusate  2 tablet Oral QHS   sodium chloride  2 spray Each Nare BID   sodium chloride flush  10 mL Other Q8H   sodium chloride flush  10-40 mL Intracatheter Q12H   Continuous Infusions:  cefTRIAXone (ROCEPHIN)  IV Stopped (08/07/21 1925)     LOS: 9 days    Time spent:35 mins, More than 50% of that time was spent in counseling and/or coordination of care.      Shelly Coss, MD [facility] P9/24/2022, 7:25 AM

## 2021-08-09 DIAGNOSIS — C3411 Malignant neoplasm of upper lobe, right bronchus or lung: Secondary | ICD-10-CM | POA: Diagnosis not present

## 2021-08-09 DIAGNOSIS — J869 Pyothorax without fistula: Secondary | ICD-10-CM | POA: Diagnosis not present

## 2021-08-09 LAB — BASIC METABOLIC PANEL
Anion gap: 11 (ref 5–15)
BUN: 9 mg/dL (ref 6–20)
CO2: 23 mmol/L (ref 22–32)
Calcium: 9.2 mg/dL (ref 8.9–10.3)
Chloride: 109 mmol/L (ref 98–111)
Creatinine, Ser: 0.77 mg/dL (ref 0.61–1.24)
GFR, Estimated: >60 mL/min (ref >60)
Glucose, Bld: 94 mg/dL (ref 70–99)
Potassium: 4.3 mmol/L (ref 3.5–5.1)
Sodium: 143 mmol/L (ref 135–145)

## 2021-08-09 LAB — CBC WITH DIFFERENTIAL/PLATELET
Abs Immature Granulocytes: 0.2 10*3/uL — ABNORMAL HIGH (ref 0.00–0.07)
Basophils Absolute: 0.1 10*3/uL (ref 0.0–0.1)
Basophils Relative: 0 %
Eosinophils Absolute: 0.1 10*3/uL (ref 0.0–0.5)
Eosinophils Relative: 0 %
HCT: 29.5 % — ABNORMAL LOW (ref 39.0–52.0)
Hemoglobin: 8.8 g/dL — ABNORMAL LOW (ref 13.0–17.0)
Immature Granulocytes: 1 %
Lymphocytes Relative: 3 %
Lymphs Abs: 0.8 10*3/uL (ref 0.7–4.0)
MCH: 27.8 pg (ref 26.0–34.0)
MCHC: 29.8 g/dL — ABNORMAL LOW (ref 30.0–36.0)
MCV: 93.1 fL (ref 80.0–100.0)
Monocytes Absolute: 1.7 10*3/uL — ABNORMAL HIGH (ref 0.1–1.0)
Monocytes Relative: 7 %
Neutro Abs: 22.2 10*3/uL — ABNORMAL HIGH (ref 1.7–7.7)
Neutrophils Relative %: 89 %
Platelets: 362 10*3/uL (ref 150–400)
RBC: 3.17 MIL/uL — ABNORMAL LOW (ref 4.22–5.81)
RDW: 19.7 % — ABNORMAL HIGH (ref 11.5–15.5)
WBC: 25.1 10*3/uL — ABNORMAL HIGH (ref 4.0–10.5)
nRBC: 0 % (ref 0.0–0.2)

## 2021-08-09 LAB — GLUCOSE, CAPILLARY
Glucose-Capillary: 96 mg/dL (ref 70–99)
Glucose-Capillary: 113 mg/dL — ABNORMAL HIGH (ref 70–99)
Glucose-Capillary: 92 mg/dL (ref 70–99)
Glucose-Capillary: 119 mg/dL — ABNORMAL HIGH (ref 70–99)

## 2021-08-09 LAB — CHOLESTEROL, BODY FLUID: Cholesterol, Fluid: 48 mg/dL

## 2021-08-09 NOTE — Progress Notes (Signed)
NAME:  William Farrell, MRN:  [id number], DOB:  [birth year], LOS: 102 ADMISSION DATE:  07/30/2021, CONSULTATION DATE:  9/15 REFERRING MD:  Dr Marylyn Ishihara, CHIEF COMPLAINT:  SOB/weakness   History of Present Illness:  60 year old male with prior history of Stage IIIa NSCLC (SCC) RUL diagnosed 02/2020 (RUL/suprahilar mass and R paratracheal and subcarinal LAD) s/p chemo XRT including keytruda, consolidation IT with durvalumab, SBRT to RUL nodules followed by Dr. Julien Nordmann. He is receiving neulasta support.   Presented to Dr. Julien Nordmann for follow-up, reportedly not felt well for 1-2 weeks, 15 lb weight loss since his prior visit, pain under right breast, worse with cough and progressive dyspnea on exertion even on his home O2.  He has had productive cough, orthopnea that has worsened over the last week. He has borrowed his wife's oxygen. A little hemoptysis, but no nausea, vomiting, or diarrhea.  No fever/chills.  He does acknowledge some trouble with aspiration of solids/liquids. Was due to start cycle #12 today.  Pertinent  Medical History  NSCLC stage IIIA (dx 02/2020)  Significant Hospital Events: Including procedures, antibiotic start and stop dates in addition to other pertinent events   Chest tube placement 07/30/2021 Cytology pleural fluid >> no malignant cells Pleural fluid cx > pan-sensitive Strep intermedius  ABX: Max 9/15 -18 Vanc9/15 -16 Flagyl 9/15-18 Roceph 9/18 >>>   Scheduled Meds:  (feeding supplement) PROSource Plus  30 mL Oral BID BM   aspirin EC  81 mg Oral QHS   atorvastatin  40 mg Oral QHS   Chlorhexidine Gluconate Cloth  6 each Topical Daily   chlorpheniramine-HYDROcodone  5 mL Oral Q12H   enoxaparin (LOVENOX) injection  40 mg Subcutaneous Q24H   feeding supplement  1 Container Oral TID BM   ferrous sulfate  325 mg Oral Q breakfast   fluticasone  2 spray Each Nare Daily   guaiFENesin  1,200 mg Oral BID   insulin aspart  0-15 Units Subcutaneous TID WC   insulin aspart   0-5 Units Subcutaneous QHS   levothyroxine  75 mcg Oral Q0600   lidocaine  1 patch Transdermal Daily   mouth rinse  15 mL Mouth Rinse BID   multivitamin with minerals  1 tablet Oral Daily   pantoprazole  40 mg Oral Daily   revefenacin  175 mcg Nebulization Daily   senna-docusate  2 tablet Oral QHS   sodium chloride  2 spray Each Nare BID   sodium chloride flush  10 mL Other Q8H   sodium chloride flush  10-40 mL Intracatheter Q12H   Continuous Infusions:  cefTRIAXone (ROCEPHIN)  IV Stopped (08/08/21 1851)   PRN Meds:.acetaminophen **OR** acetaminophen, dextrose, hydrALAZINE, HYDROmorphone (DILAUDID) injection, ipratropium-albuterol, loratadine, methocarbamol, metoprolol tartrate, ondansetron **OR** ondansetron (ZOFRAN) IV, oxyCODONE-acetaminophen, phenol, sodium chloride flush, traZODone   Interim History / Subjective:  Overall better over last few days, still gen chest discomfort   Objective   Blood pressure 119/74, pulse (!) 105, temperature 98.2 F (36.8 C), temperature source Oral, resp. rate 20, height 6' (1.829 m), weight 91.9 kg, SpO2 95 %.        Intake/Output Summary (Last 24 hours) at 08/09/2021 0539 Last data filed at 08/08/2021 1915 Gross per 24 hour  Intake 100 ml  Output 445 ml  Net -345 ml   Filed Weights   07/30/21 1101 07/30/21 1133 08/06/21 1255  Weight: 95.4 kg 88.9 kg 91.9 kg    Examination:  Tmax   98.2 General appearance:   more chronic than acutely  ill at this point   At Rest 02 sats  95% on RA  No jvd Oropharynx clear,  mucosa nl Neck supple Lungs with a few scattered exp > insp rhonchi bilaterally/ L chest tube still draining pus  RRR no s3 or or sign murmur Abd soft/ nl  excursion  Extr warm with no edema or clubbing noted Neuro  Sensorium intact ,  no apparent motor deficits     Resolved Hospital Problem list     Assessment & Plan:   Right Strep intermedius empyema with chest tube in place 9/15.  Continues to have yellow purulent  material draining from chest tube, slightly thinner but still purulent Persistent R pneumothorax, trapped lung -Continue saline flushes to ensure chest tube patency -Pain control with chest tube in place -Need to push PT/OT/OOB - abx as per flowsheet above  >>> rec consider repeat CT and do lytics before removing chest tube   Non-small cell lung cancer, currently undergoing therapy.  Concern for possible progressive disease most recent CT. pleural fluid cytology negative -Current therapy is on hold since he had acute infection.   >>> rx per oncology  Persistent cough, purulent sputum -Respiratory culture normal flora -Continue nasal saline, nasal steroids    Atrial fibrillation with RVR, resolved.  Was transient in the setting of acute infection -On enoxaparin prophylactic dose  Diabetes mellitus type 2 -As per [facility] plans   Best Practice (right click and "Reselect all SmartList Selections" daily)   Diet/type: Regular consistency (see orders) DVT prophylaxis:  GI prophylaxis: PPI Lines: N/A Foley:  N/A Code Status:  full code Last date of multidisciplinary goals of care discussion [Per primary] Family: Updated the patient and his wife at bedside  9/24 am Disposition >> agree can go to floor bed   Labs   CBC: Recent Labs  Lab 08/04/21 0542 08/05/21 0533 08/06/21 0424 08/07/21 0347 08/09/21 0420  WBC 24.4* 24.5* 23.1* 24.2* 25.1*  NEUTROABS 21.7* 21.7* 20.1* 20.9* PENDING  HGB 9.1* 9.8* 9.0* 9.0* 8.8*  HCT 30.0* 31.7* 29.6* 29.7* 29.5*  MCV 92.0 93.0 94.0 94.3 93.1  PLT 374 385 349 347 606    Basic Metabolic Panel: Recent Labs  Lab 08/03/21 0559 08/03/21 0746 08/04/21 0542 08/06/21 0424 08/07/21 0347 08/09/21 0420  NA  --  138 137  --  136 143  K  --  4.3 4.5  --  4.0 4.3  CL  --  109 106  --  104 109  CO2  --  21* 23  --  22 23  GLUCOSE  --  152* 137*  --  81 94  BUN  --  19 16  --  9 9  CREATININE  --  0.83 0.78 0.76 0.66 0.77  CALCIUM  --  9.2 9.0   --  8.7* 9.2  MG 1.9  --  1.7  --   --   --    GFR: Estimated Creatinine Clearance: 107.8 mL/min (by C-G formula based on SCr of 0.77 mg/dL). Recent Labs  Lab 08/05/21 0533 08/06/21 0424 08/07/21 0347 08/09/21 0420  WBC 24.5* 23.1* 24.2* 25.1*    Liver Function Tests: No results for input(s): AST, ALT, ALKPHOS, BILITOT, PROT, ALBUMIN in the last 168 hours.  No results for input(s): LIPASE, AMYLASE in the last 168 hours. No results for input(s): AMMONIA in the last 168 hours.  ABG    Component Value Date/Time   HCO3 18.6 (L) 07/30/2021 2235   ACIDBASEDEF 5.5 (H) 07/30/2021  2235   O2SAT 86.8 07/30/2021 2235     Coagulation Profile: No results for input(s): INR, PROTIME in the last 168 hours.   CBG: Recent Labs  Lab 08/08/21 0448 08/08/21 0734 08/08/21 1103 08/08/21 1606 08/08/21 2251  GLUCAP 70 77 134* 121* 97       Christinia Gully, MD [facility] [phone]   After 7:00 pm call Elink  [phone]

## 2021-08-09 NOTE — Progress Notes (Signed)
PROGRESS NOTE    William Farrell  JIR:[id number] DOB: February 14, 1961 DOA: 07/30/2021 PCP: [name], DO   Chief Complain: Fatigue, poor oral intake  Brief Narrative: Patient is a 60 year old male with history of stage IIIa non-small cell lung cancer currently on chemotherapy, hyperlipidemia, diabetes type 2 who presented here with complaints of fatigue, poor oral intake for 2 weeks before admission.  On presentation he was found to be in A. fib with RVR, DKA and also found to have loculated right-sided pleural effusion.  PCCM was consulted on presentation, status post right chest tube placement.  Currently on broad-spectrum antibiotics for loculated right-sided pleural effusion.  Oncology following.  A. fib with RVR has resolved and currently in normal sinus rhythm.  Still on chest tube.  PCCM following  Assessment & Plan:   Principal Problem:   Empyema of right pleural space (HCC) Active Problems:   Type 2 diabetes mellitus without complication, without long-term current use of insulin (HCC)   Malignant neoplasm of right upper lobe of lung (HCC)   Atrial fibrillation with rapid ventricular response (HCC)   Malnutrition of moderate degree   Sepsis secondary to loculated right-sided pleural effusion: Presented with shortness of breath.  Status post chest tube placement on 9/15.  Procalcitonin was elevated.   He was on broad-spectrum antibiotics with vancomycin, cefepime, Flagyl.  Pleural fluid culture showing streptococcus intermedius, pansensitive.  Blood cultures are negative . Antibiotic has been changed to ceftriaxone,day 7.  Sputum culture showing few gram-negative coccobacilli. Cough is better.  Chest tube is still draining significant purulent fluid.The  plan  is to remove the chest tube when the drain is less than 50-100 cc. Still has significant leukocytosis,but  afebrile.  His leukocytosis most likely secondary to malignancy. Last follow-up chest x-ray showed increased in   size of a small right-sided pneumothorax  Stage III non-small cell lung cancer: Follows with Dr. Julien Nordmann, he was being treated with chemo.   Oncology following. Chest CT done on 9.18.22 showed  extensive, diffuse pleural thickening and adjacent interlobular septal thickening with a small, loculated hydropneumothorax, approximately 10% volume,perihilar and suprahilar right lung malignancy with adjacent satellite nodularity,multiple small pulmonary nodules throughout the lungs consistent with worsening pulmonary metastatic disease. Pleural fluid cytology negative for malignancy  A. fib with RVR: Likely transient, now resolved after starting on Cardizem drip.  Currently in normal sinus rhythm.  TSH normal.  Echocardiogram showed EF of 60 to 65%, normal left ventricular diastolic parameters.  Not a started on anticoagulation because of transient nature of the A. fib with RVR/likely related to pulmonary issues.  DKA/diabetes type 2: Initially started on insulin drip now transitioned to subcutaneous insulin.  Takes metformin, glipizide, Ozempic at home.  Monitor blood sugars.  Continue current insulin regimen.  Hemoglobin A1c of 6.6.  Sugars are running low so Lantus discontinued.  AKI on CKD stage IIIa: Creatinine was in the range of 2 on presentation.  Baseline creatinine around 1.3.  Currently kidney function is normal.  Chronic normocytic anemia: Baseline hemoglobin around 8.   No evidence of acute blood loss.  Most likely associated with underlying malignancy, chemotherapy.  Iron studies showed low iron,given IV iron infusion.  Transfused with a unit of PRBC on 08/02/2021.  Hyperlipidemia: On statin  Hypothyroidism: On Synthyroid  GERD: Continue PPI  Hypokalemia/hypomagnesemia: Being monitored and supplemented intermittently  Moderate malnutrition: Secondary to malignancy.  Nutrition following  Debility/deconditioning: PT/OT evaluated the patient and recommend home health on  discharge.  Nutrition Problem: Moderate Malnutrition  Etiology: chronic illness, cancer and cancer related treatments      DVT prophylaxis:Lovenox Code Status: Full Family Communication: Wife present at bedside Status is: Inpatient  Remains inpatient appropriate because:Inpatient level of care appropriate due to severity of illness  Dispo: The patient is from: Home              Anticipated d/c is to: Home              Patient currently is not medically stable to d/c.   Difficult to place patient No    Consultants: Oncology,PCCM  Procedures:Chest tube placement  Antimicrobials:  Anti-infectives (From admission, onward)    Start     Dose/Rate Route Frequency Ordered Stop   08/05/21 1800  cefTRIAXone (ROCEPHIN) 2 g in sodium chloride 0.9 % 100 mL IVPB        2 g 200 mL/hr over 30 Minutes Intravenous Every 24 hours 08/05/21 1316     08/02/21 1800  cefTRIAXone (ROCEPHIN) 2 g in sodium chloride 0.9 % 100 mL IVPB  Status:  Discontinued        2 g 200 mL/hr over 30 Minutes Intravenous Every 24 hours 08/02/21 0947 08/05/21 1316   08/02/21 1100  vancomycin (VANCOREADY) IVPB 1250 mg/250 mL  Status:  Discontinued        1,250 mg 166.7 mL/hr over 90 Minutes Intravenous Every 12 hours 08/02/21 0726 08/02/21 0910   08/01/21 1100  vancomycin (VANCOCIN) IVPB 1000 mg/200 mL premix  Status:  Discontinued        1,000 mg 200 mL/hr over 60 Minutes Intravenous Every 12 hours 08/01/21 0951 08/02/21 0726   07/31/21 1800  vancomycin (VANCOREADY) IVPB 1250 mg/250 mL  Status:  Discontinued        1,250 mg 166.7 mL/hr over 90 Minutes Intravenous Every 24 hours 07/30/21 1911 07/31/21 0845   07/31/21 1800  vancomycin (VANCOREADY) IVPB 1500 mg/300 mL  Status:  Discontinued        1,500 mg 150 mL/hr over 120 Minutes Intravenous Every 24 hours 07/31/21 0845 08/01/21 0951   07/31/21 0945  ceFEPIme (MAXIPIME) 2 g in sodium chloride 0.9 % 100 mL IVPB  Status:  Discontinued        2 g 200 mL/hr over 30  Minutes Intravenous Every 8 hours 07/31/21 0845 08/02/21 0948   07/30/21 2000  metroNIDAZOLE (FLAGYL) IVPB 500 mg  Status:  Discontinued        500 mg 100 mL/hr over 60 Minutes Intravenous Every 12 hours 07/30/21 1738 08/03/21 1019   07/30/21 1830  vancomycin (VANCOREADY) IVPB 1750 mg/350 mL        1,750 mg 175 mL/hr over 120 Minutes Intravenous  Once 07/30/21 1738 07/30/21 1846   07/30/21 1800  ceFEPIme (MAXIPIME) 2 g in sodium chloride 0.9 % 100 mL IVPB  Status:  Discontinued        2 g 200 mL/hr over 30 Minutes Intravenous Every 12 hours 07/30/21 1723 07/31/21 0845       Subjective: Patient seen and examined at the bedside this morning.  Hemodynamically stable.  Wife at bedside.  Continues to complain of weakness.  His cough is better.  Looks irritated, not happy.  Plan is to transfer him to progress unit today Objective: Vitals:   08/09/21 0400 08/09/21 0500 08/09/21 0600 08/09/21 0700  BP: (!) 84/68 119/74 114/70 131/77  Pulse: 76 (!) 105 96 96  Resp: (!) 22 20 18  (!) 22  Temp:      TempSrc:  SpO2: 91% 95% 94% 96%  Weight:      Height:        Intake/Output Summary (Last 24 hours) at 08/09/2021 0737 Last data filed at 08/09/2021 0500 Gross per 24 hour  Intake 100 ml  Output 435 ml  Net -335 ml   Filed Weights   07/30/21 1101 07/30/21 1133 08/06/21 1255  Weight: 95.4 kg 88.9 kg 91.9 kg    Examination:  General exam: Not in distress, chronically ill looking, weak HEENT: PERRL Respiratory system:  no wheezes or crackles, chest tube on the right side with purulent drainage Cardiovascular system: S1 & S2 heard, RRR.  Chemo-Port on the left chest Gastrointestinal system: Abdomen is nondistended, soft and nontender. Central nervous system: Alert and oriented Extremities: No edema, no clubbing ,no cyanosis Skin: No rashes, no ulcers,no icterus   Data Reviewed: I have personally reviewed following labs and imaging studies  CBC: Recent Labs  Lab 08/04/21 0542  08/05/21 0533 08/06/21 0424 08/07/21 0347 08/09/21 0420  WBC 24.4* 24.5* 23.1* 24.2* 25.1*  NEUTROABS 21.7* 21.7* 20.1* 20.9* 22.2*  HGB 9.1* 9.8* 9.0* 9.0* 8.8*  HCT 30.0* 31.7* 29.6* 29.7* 29.5*  MCV 92.0 93.0 94.0 94.3 93.1  PLT 374 385 349 347 161   Basic Metabolic Panel: Recent Labs  Lab 08/03/21 0559 08/03/21 0746 08/04/21 0542 08/06/21 0424 08/07/21 0347 08/09/21 0420  NA  --  138 137  --  136 143  K  --  4.3 4.5  --  4.0 4.3  CL  --  109 106  --  104 109  CO2  --  21* 23  --  22 23  GLUCOSE  --  152* 137*  --  81 94  BUN  --  19 16  --  9 9  CREATININE  --  0.83 0.78 0.76 0.66 0.77  CALCIUM  --  9.2 9.0  --  8.7* 9.2  MG 1.9  --  1.7  --   --   --    GFR: Estimated Creatinine Clearance: 107.8 mL/min (by C-G formula based on SCr of 0.77 mg/dL). Liver Function Tests: No results for input(s): AST, ALT, ALKPHOS, BILITOT, PROT, ALBUMIN in the last 168 hours.  No results for input(s): LIPASE, AMYLASE in the last 168 hours. No results for input(s): AMMONIA in the last 168 hours. Coagulation Profile: No results for input(s): INR, PROTIME in the last 168 hours.  Cardiac Enzymes: No results for input(s): CKTOTAL, CKMB, CKMBINDEX, TROPONINI in the last 168 hours. BNP (last 3 results) No results for input(s): PROBNP in the last 8760 hours. HbA1C: No results for input(s): HGBA1C in the last 72 hours.  CBG: Recent Labs  Lab 08/08/21 0448 08/08/21 0734 08/08/21 1103 08/08/21 1606 08/08/21 2251  GLUCAP 70 77 134* 121* 97   Lipid Profile: No results for input(s): CHOL, HDL, LDLCALC, TRIG, CHOLHDL, LDLDIRECT in the last 72 hours. Thyroid Function Tests: No results for input(s): TSH, T4TOTAL, FREET4, T3FREE, THYROIDAB in the last 72 hours.  Anemia Panel: No results for input(s): VITAMINB12, FOLATE, FERRITIN, TIBC, IRON, RETICCTPCT in the last 72 hours.  Sepsis Labs: No results for input(s): PROCALCITON, LATICACIDVEN in the last 168 hours.   Recent Results  (from the past 240 hour(s))  Resp Panel by RT-PCR (Flu A&B, Covid) Nasopharyngeal Swab     Status: None   Collection Time: 07/30/21 11:19 AM   Specimen: Nasopharyngeal Swab; Nasopharyngeal(NP) swabs in vial transport medium  Result Value Ref Range Status   SARS  Coronavirus 2 by RT PCR NEGATIVE NEGATIVE Final    Comment: (NOTE) SARS-CoV-2 target nucleic acids are NOT DETECTED.  The SARS-CoV-2 RNA is generally detectable in upper respiratory specimens during the acute phase of infection. The lowest concentration of SARS-CoV-2 viral copies this assay can detect is 138 copies/mL. A negative result does not preclude SARS-Cov-2 infection and should not be used as the sole basis for treatment or other patient management decisions. A negative result may occur with  improper specimen collection/handling, submission of specimen other than nasopharyngeal swab, presence of viral mutation(s) within the areas targeted by this assay, and inadequate number of viral copies(<138 copies/mL). A negative result must be combined with clinical observations, patient history, and epidemiological information. The expected result is Negative.  Fact Sheet for Patients:  EntrepreneurPulse.com.au  Fact Sheet for Healthcare Providers:  IncredibleEmployment.be  This test is no t yet approved or cleared by the [place] FDA and  has been authorized for detection and/or diagnosis of SARS-CoV-2 by FDA under an Emergency Use Authorization (EUA). This EUA will remain  in effect (meaning this test can be used) for the duration of the COVID-19 declaration under Section 564(b)(1) of the Act, 21 U.S.C.section 360bbb-3(b)(1), unless the authorization is terminated  or revoked sooner.       Influenza A by PCR NEGATIVE NEGATIVE Final   Influenza B by PCR NEGATIVE NEGATIVE Final    Comment: (NOTE) The Xpert Xpress SARS-CoV-2/FLU/RSV plus assay is intended as an aid in the  diagnosis of influenza from Nasopharyngeal swab specimens and should not be used as a sole basis for treatment. Nasal washings and aspirates are unacceptable for Xpert Xpress SARS-CoV-2/FLU/RSV testing.  Fact Sheet for Patients: EntrepreneurPulse.com.au  Fact Sheet for Healthcare Providers: IncredibleEmployment.be  This test is not yet approved or cleared by the [place] FDA and has been authorized for detection and/or diagnosis of SARS-CoV-2 by FDA under an Emergency Use Authorization (EUA). This EUA will remain in effect (meaning this test can be used) for the duration of the COVID-19 declaration under Section 564(b)(1) of the Act, 21 U.S.C. section 360bbb-3(b)(1), unless the authorization is terminated or revoked.  Performed at [facility], [place] [address] [place], [place] [address]   Body fluid culture w Gram Stain     Status: None   Collection Time: 07/30/21  5:35 PM   Specimen: Pleura; Body Fluid  Result Value Ref Range Status   Specimen Description   Final    PLEURAL RIGHT Performed at [place] [address] [place], [place] [address]    Special Requests   Final    NONE Performed at [facility], [place] [address], [place], [place] [address]    Gram Stain   Final    ABUNDANT WBC PRESENT,BOTH PMN AND MONONUCLEAR FEW GRAM POSITIVE COCCI Performed at [facility], [place] [address] [place], [place] [address]    Culture ABUNDANT STREPTOCOCCUS INTERMEDIUS  Final   Report Status 08/03/2021 FINAL  Final   Organism ID, Bacteria STREPTOCOCCUS INTERMEDIUS  Final      Susceptibility   Streptococcus intermedius - MIC*    PENICILLIN <=0.06 SENSITIVE Sensitive     CEFTRIAXONE 0.25 SENSITIVE Sensitive     ERYTHROMYCIN <=0.12 SENSITIVE Sensitive     LEVOFLOXACIN 0.5 SENSITIVE Sensitive     VANCOMYCIN 0.5 SENSITIVE Sensitive     * ABUNDANT STREPTOCOCCUS INTERMEDIUS   Culture, blood (routine x 2)     Status: None   Collection  Time: 07/30/21 10:35 PM   Specimen: Right Antecubital; Blood  Result Value Ref Range Status   Specimen Description   Final    RIGHT ANTECUBITAL Performed at [facility], [place] [address] [place], [place] [address]    Special Requests   Final    BOTTLES DRAWN AEROBIC AND ANAEROBIC Blood Culture adequate volume Performed at [place] [address], [place], [place] [address]    Culture   Final    NO GROWTH 5 DAYS Performed at [facility], [place] [address], [place], [place] [address]    Report Status 08/05/2021 FINAL  Final  Culture, blood (routine x 2)     Status: None   Collection Time: 07/30/21 10:35 PM   Specimen: Left Antecubital; Blood  Result Value Ref Range Status   Specimen Description   Final    LEFT ANTECUBITAL Performed at [facility] [address] [place], [place] [address]    Special Requests   Final    BOTTLES DRAWN AEROBIC AND ANAEROBIC Blood Culture adequate volume Performed at [place] [address] [place], [place] [address]    Culture   Final    NO GROWTH 5 DAYS Performed at [facility], [place] [address], [place], [place] [address]    Report Status 08/05/2021 FINAL  Final  MRSA Next Gen by PCR, Nasal     Status: None   Collection Time: 07/30/21 11:07 PM   Specimen: Nasal Mucosa; Nasal Swab  Result Value Ref Range Status   MRSA by PCR Next Gen NOT DETECTED NOT DETECTED Final    Comment: (NOTE) The GeneXpert MRSA Assay (FDA approved for NASAL specimens only), is one component of a comprehensive MRSA colonization surveillance program. It is not intended to diagnose MRSA infection nor to guide or monitor treatment for MRSA infections. Test performance is not FDA approved in patients less than 83 years old. Performed at [facility], [place] [address], [place], [place] [address]    Urine Culture     Status: Abnormal   Collection Time: 07/31/21  2:20 PM   Specimen: Urine, Clean Catch  Result Value Ref Range Status   Specimen Description   Final    URINE, CLEAN CATCH Performed at [facility], [place] [address], [place], [place] [address]    Special Requests   Final    NONE Performed at [facility], [place] [address], [place], [place] [address]    Culture (A)  Final    <10,000 COLONIES/mL INSIGNIFICANT GROWTH Performed at [facility] [address], [place], [place] [address]    Report Status 08/02/2021 FINAL  Final  Expectorated Sputum Assessment w Gram Stain, Rflx to Resp Cult     Status: None   Collection Time: 08/02/21 10:15 AM   Specimen: Expectorated Sputum  Result Value Ref Range Status   Specimen Description EXPECTORATED SPUTUM  Final   Special Requests NONE  Final   Sputum evaluation   Final    THIS SPECIMEN IS ACCEPTABLE FOR SPUTUM CULTURE Performed at [facility], [place] [address], [place], [place] [address]    Report Status 08/02/2021 FINAL  Final  Culture, Respiratory w Gram Stain     Status: None   Collection Time: 08/02/21 10:15 AM  Result Value Ref Range Status   Specimen Description   Final    EXPECTORATED SPUTUM Performed at [facility], [place]  [address], [place], [place] [address]    Special Requests   Final    NONE Reflexed from [phone] Performed at [facility], [place] [address] [place], [place] [address]    Gram Stain   Final    FEW WBC PRESENT, PREDOMINANTLY PMN FEW GRAM NEGATIVE COCCOBACILLI    Culture   Final    FEW Normal respiratory flora-no Staph aureus or Pseudomonas seen Performed at [place] [address], [place], [place] [address]    Report Status 08/04/2021 FINAL  Final         Radiology Studies: No results found.      Scheduled Meds:  (feeding supplement) PROSource Plus  30 mL Oral BID BM    aspirin EC  81 mg Oral QHS   atorvastatin  40 mg Oral QHS   Chlorhexidine Gluconate Cloth  6 each Topical Daily   chlorpheniramine-HYDROcodone  5 mL Oral Q12H   enoxaparin (LOVENOX) injection  40 mg Subcutaneous Q24H   feeding supplement  1 Container Oral TID BM   ferrous sulfate  325 mg Oral Q breakfast   fluticasone  2 spray Each Nare Daily   guaiFENesin  1,200 mg Oral BID   insulin aspart  0-15 Units Subcutaneous TID WC   insulin aspart  0-5 Units Subcutaneous QHS   levothyroxine  75 mcg Oral Q0600   lidocaine  1 patch Transdermal Daily   mouth rinse  15 mL Mouth Rinse BID   multivitamin with minerals  1 tablet Oral Daily   pantoprazole  40 mg Oral Daily   revefenacin  175 mcg Nebulization Daily   senna-docusate  2 tablet Oral QHS   sodium chloride  2 spray Each Nare BID   sodium chloride flush  10 mL Other Q8H   sodium chloride flush  10-40 mL Intracatheter Q12H   Continuous Infusions:  cefTRIAXone (ROCEPHIN)  IV Stopped (08/08/21 1851)     LOS: 10 days    Time spent:35 mins, More than 50% of that time was spent in counseling and/or coordination of care.      Shelly Coss, MD [facility] P9/25/2022, 7:37 AM

## 2021-08-10 ENCOUNTER — Inpatient Hospital Stay (HOSPITAL_COMMUNITY): Payer: BC Managed Care – PPO

## 2021-08-10 DIAGNOSIS — J869 Pyothorax without fistula: Secondary | ICD-10-CM | POA: Diagnosis not present

## 2021-08-10 DIAGNOSIS — C3411 Malignant neoplasm of upper lobe, right bronchus or lung: Secondary | ICD-10-CM | POA: Diagnosis not present

## 2021-08-10 LAB — GLUCOSE, CAPILLARY
Glucose-Capillary: 104 mg/dL — ABNORMAL HIGH (ref 70–99)
Glucose-Capillary: 161 mg/dL — ABNORMAL HIGH (ref 70–99)
Glucose-Capillary: 111 mg/dL — ABNORMAL HIGH (ref 70–99)
Glucose-Capillary: 137 mg/dL — ABNORMAL HIGH (ref 70–99)

## 2021-08-10 LAB — LACTATE DEHYDROGENASE, PLEURAL OR PERITONEAL FLUID: LD, Fluid: >10000 U/L — ABNORMAL HIGH (ref 3–23)

## 2021-08-10 MED ORDER — DILTIAZEM LOAD VIA INFUSION: 10.0000 mg | Freq: Once | INTRAVENOUS | Status: DC

## 2021-08-10 MED ORDER — DILTIAZEM HCL-DEXTROSE 125-5 MG/125ML-% IV SOLN (PREMIX)
5.0000 mg/h | INTRAVENOUS | Status: DC
  Administered 2021-08-10: 5 mg/h via INTRAVENOUS
  Administered 2021-08-11: 10 mg/h via INTRAVENOUS
  Filled 2021-08-10 (×2): qty 125

## 2021-08-10 NOTE — Progress Notes (Signed)
SLP Cancellation Note  Patient Details Name: William Farrell MRN: [id number] DOB: Apr 14, 1961   Cancelled treatment:       Reason Eval/Treat Not Completed: Other (comment) (Patient would like to hold off on MBS at this time). Patient reported that his coughing has improved some but he would like to wait for MBS until he has more resolution in his coughing or after chest tube can be removed. SLP will continue to follow for patient readiness for objective swallow study.  Sonia Baller, MA, CCC-SLP Speech Therapy

## 2021-08-10 NOTE — Plan of Care (Signed)
  Problem: Clinical Measurements: Goal: Ability to maintain clinical measurements within normal limits will improve Outcome: Progressing   Problem: Activity: Goal: Risk for activity intolerance will decrease Outcome: Progressing   Problem: Nutrition: Goal: Adequate nutrition will be maintained Outcome: Progressing   Problem: Coping: Goal: Level of anxiety will decrease Outcome: Progressing   

## 2021-08-10 NOTE — Progress Notes (Signed)
Nurse was notified by telemetry that pt was in A-fib with RVR, HR in 170's. Completed reassessment and took VS. VS WNL and MD made aware. Cardizem drip ordered. Nurse initiated drip and reassessed VS after 30 min and titrated as ordered. HR 496 and systolic 759. William Farrell

## 2021-08-10 NOTE — Progress Notes (Addendum)
PROGRESS NOTE    William Farrell  UVO:[id number] DOB: 1961/11/03 DOA: 07/30/2021 PCP: [name], DO   Chief Complain: Fatigue, poor oral intake  Brief Narrative: Patient is a 60 year old male with history of stage IIIa non-small cell lung cancer currently on chemotherapy, hyperlipidemia, diabetes type 2 who presented here with complaints of fatigue, poor oral intake for 2 weeks before admission.  On presentation he was found to be in A. fib with RVR, DKA and also found to have loculated right-sided pleural effusion.  PCCM was consulted on presentation, status post right chest tube placement.  Currently on broad-spectrum antibiotics for loculated right-sided pleural effusion.  Oncology following.  A. fib with RVR has resolved and currently in normal sinus rhythm.  Still on chest tube.  PCCM following  Assessment & Plan:   Principal Problem:   Empyema of right pleural space (HCC) Active Problems:   Type 2 diabetes mellitus without complication, without long-term current use of insulin (HCC)   Malignant neoplasm of right upper lobe of lung (HCC)   Atrial fibrillation with rapid ventricular response (HCC)   Malnutrition of moderate degree   Sepsis secondary to loculated right-sided pleural effusion: Presented with shortness of breath.  Status post chest tube placement on 9/15.  Procalcitonin was elevated.   He was on broad-spectrum antibiotics with vancomycin, cefepime, Flagyl.  Pleural fluid culture showing streptococcus intermedius, pansensitive.  Blood cultures are negative . Antibiotic has been changed to ceftriaxone,day 10.  Sputum culture showing few gram-negative coccobacilli. Chest tube is still draining significant purulent fluid.The  plan  is to remove the chest tube when the drain is less than 50-100 cc. Still has significant leukocytosis,but  afebrile.  His leukocytosis most likely secondary to malignancy. Last follow-up chest x-ray showed increased in  size of a small  right-sided pneumothorax.  We will check follow-up chest x-ray today.  He is still having significant cough.  Stage III non-small cell lung cancer: Follows with Dr. Julien Nordmann, he was being treated with chemo.   Oncology following. Chest CT done on 9.18.22 showed  extensive, diffuse pleural thickening and adjacent interlobular septal thickening with a small, loculated hydropneumothorax, approximately 10% volume,perihilar and suprahilar right lung malignancy with adjacent satellite nodularity,multiple small pulmonary nodules throughout the lungs consistent with worsening pulmonary metastatic disease. Pleural fluid cytology negative for malignancy  A. fib with RVR: Likely transient, now resolved after starting on Cardizem drip.  Currently in normal sinus rhythm.  TSH normal.  Echocardiogram showed EF of 60 to 65%, normal left ventricular diastolic parameters.  Not a started on anticoagulation because of transient nature of the A. fib with RVR/likely related to pulmonary issues.  DKA/diabetes type 2: Initially started on insulin drip now transitioned to subcutaneous insulin.  Takes metformin, glipizide, Ozempic at home.  Monitor blood sugars.  Continue current insulin regimen.  Hemoglobin A1c of 6.6.  Sugars are running low so Lantus discontinued.  AKI on CKD stage IIIa: Creatinine was in the range of 2 on presentation.  Baseline creatinine around 1.3.  Currently kidney function is normal.  Chronic normocytic anemia: Baseline hemoglobin around 8.   No evidence of acute blood loss.  Most likely associated with underlying malignancy, chemotherapy.  Iron studies showed low iron,given IV iron infusion.  Transfused with a unit of PRBC on 08/02/2021.  Hyperlipidemia: On statin  Hypothyroidism: On Synthyroid  GERD: Continue PPI  Hypokalemia/hypomagnesemia: Being monitored and supplemented intermittently  Moderate malnutrition: Secondary to malignancy.  Nutrition following  Debility/deconditioning: PT/OT  evaluated the patient  and recommend home health on discharge.  Nutrition Problem: Moderate Malnutrition Etiology: chronic illness, cancer and cancer related treatments      DVT prophylaxis:Lovenox Code Status: Full Family Communication: Wife present at bedside Status is: Inpatient  Remains inpatient appropriate because:Inpatient level of care appropriate due to severity of illness  Dispo: The patient is from: Home              Anticipated d/c is to: Home              Patient currently is not medically stable to d/c.   Difficult to place patient No    Consultants: Oncology,PCCM  Procedures:Chest tube placement  Antimicrobials:  Anti-infectives (From admission, onward)    Start     Dose/Rate Route Frequency Ordered Stop   08/05/21 1800  cefTRIAXone (ROCEPHIN) 2 g in sodium chloride 0.9 % 100 mL IVPB        2 g 200 mL/hr over 30 Minutes Intravenous Every 24 hours 08/05/21 1316     08/02/21 1800  cefTRIAXone (ROCEPHIN) 2 g in sodium chloride 0.9 % 100 mL IVPB  Status:  Discontinued        2 g 200 mL/hr over 30 Minutes Intravenous Every 24 hours 08/02/21 0947 08/05/21 1316   08/02/21 1100  vancomycin (VANCOREADY) IVPB 1250 mg/250 mL  Status:  Discontinued        1,250 mg 166.7 mL/hr over 90 Minutes Intravenous Every 12 hours 08/02/21 0726 08/02/21 0910   08/01/21 1100  vancomycin (VANCOCIN) IVPB 1000 mg/200 mL premix  Status:  Discontinued        1,000 mg 200 mL/hr over 60 Minutes Intravenous Every 12 hours 08/01/21 0951 08/02/21 0726   07/31/21 1800  vancomycin (VANCOREADY) IVPB 1250 mg/250 mL  Status:  Discontinued        1,250 mg 166.7 mL/hr over 90 Minutes Intravenous Every 24 hours 07/30/21 1911 07/31/21 0845   07/31/21 1800  vancomycin (VANCOREADY) IVPB 1500 mg/300 mL  Status:  Discontinued        1,500 mg 150 mL/hr over 120 Minutes Intravenous Every 24 hours 07/31/21 0845 08/01/21 0951   07/31/21 0945  ceFEPIme (MAXIPIME) 2 g in sodium chloride 0.9 % 100 mL IVPB   Status:  Discontinued        2 g 200 mL/hr over 30 Minutes Intravenous Every 8 hours 07/31/21 0845 08/02/21 0948   07/30/21 2000  metroNIDAZOLE (FLAGYL) IVPB 500 mg  Status:  Discontinued        500 mg 100 mL/hr over 60 Minutes Intravenous Every 12 hours 07/30/21 1738 08/03/21 1019   07/30/21 1830  vancomycin (VANCOREADY) IVPB 1750 mg/350 mL        1,750 mg 175 mL/hr over 120 Minutes Intravenous  Once 07/30/21 1738 07/30/21 1846   07/30/21 1800  ceFEPIme (MAXIPIME) 2 g in sodium chloride 0.9 % 100 mL IVPB  Status:  Discontinued        2 g 200 mL/hr over 30 Minutes Intravenous Every 12 hours 07/30/21 1723 07/31/21 0845       Subjective:  Patient seen and examined at the bedside this morning.  Hemodynamically stable.  On 2 L of oxygen per minute.  He was coughing and bringing up yellow sputum.  Chest tube still draining yellow purulent fluid.  Very frustrated due to his overall status, complains of nausea.   Objective: Vitals:   08/09/21 2037 08/09/21 2200 08/10/21 0037 08/10/21 0543  BP: 118/86  138/78 136/74  Pulse: (!) 119 Marland Kitchen)  108 (!) 108 (!) 109  Resp: 20  14 20   Temp: 98.7 F (37.1 C)  99.3 F (37.4 C) 98.4 F (36.9 C)  TempSrc: Oral  Oral Oral  SpO2: 91%  90% 96%  Weight:      Height:        Intake/Output Summary (Last 24 hours) at 08/10/2021 0803 Last data filed at 08/10/2021 0600 Gross per 24 hour  Intake 380 ml  Output 790 ml  Net -410 ml   Filed Weights   07/30/21 1101 07/30/21 1133 08/06/21 1255  Weight: 95.4 kg 88.9 kg 91.9 kg    Examination:  General exam: Chronically looking, weak, deconditioned  HEENT: PERRL Respiratory system:  no wheezes or crackles .  Chest tube in the right side draining purulent material Cardiovascular system: S1 & S2 heard, RRR.  Gastrointestinal system: Abdomen is nondistended, soft and nontender. Central nervous system: Alert and oriented Extremities: No edema, no clubbing ,no cyanosis Skin: No rashes, no ulcers,no icterus     Data Reviewed: I have personally reviewed following labs and imaging studies  CBC: Recent Labs  Lab 08/04/21 0542 08/05/21 0533 08/06/21 0424 08/07/21 0347 08/09/21 0420  WBC 24.4* 24.5* 23.1* 24.2* 25.1*  NEUTROABS 21.7* 21.7* 20.1* 20.9* 22.2*  HGB 9.1* 9.8* 9.0* 9.0* 8.8*  HCT 30.0* 31.7* 29.6* 29.7* 29.5*  MCV 92.0 93.0 94.0 94.3 93.1  PLT 374 385 349 347 710   Basic Metabolic Panel: Recent Labs  Lab 08/04/21 0542 08/06/21 0424 08/07/21 0347 08/09/21 0420  NA 137  --  136 143  K 4.5  --  4.0 4.3  CL 106  --  104 109  CO2 23  --  22 23  GLUCOSE 137*  --  81 94  BUN 16  --  9 9  CREATININE 0.78 0.76 0.66 0.77  CALCIUM 9.0  --  8.7* 9.2  MG 1.7  --   --   --    GFR: Estimated Creatinine Clearance: 107.8 mL/min (by C-G formula based on SCr of 0.77 mg/dL). Liver Function Tests: No results for input(s): AST, ALT, ALKPHOS, BILITOT, PROT, ALBUMIN in the last 168 hours.  No results for input(s): LIPASE, AMYLASE in the last 168 hours. No results for input(s): AMMONIA in the last 168 hours. Coagulation Profile: No results for input(s): INR, PROTIME in the last 168 hours.  Cardiac Enzymes: No results for input(s): CKTOTAL, CKMB, CKMBINDEX, TROPONINI in the last 168 hours. BNP (last 3 results) No results for input(s): PROBNP in the last 8760 hours. HbA1C: No results for input(s): HGBA1C in the last 72 hours.  CBG: Recent Labs  Lab 08/08/21 2251 08/09/21 0803 08/09/21 1231 08/09/21 1729 08/09/21 2215  GLUCAP 97 96 113* 92 119*   Lipid Profile: No results for input(s): CHOL, HDL, LDLCALC, TRIG, CHOLHDL, LDLDIRECT in the last 72 hours. Thyroid Function Tests: No results for input(s): TSH, T4TOTAL, FREET4, T3FREE, THYROIDAB in the last 72 hours.  Anemia Panel: No results for input(s): VITAMINB12, FOLATE, FERRITIN, TIBC, IRON, RETICCTPCT in the last 72 hours.  Sepsis Labs: No results for input(s): PROCALCITON, LATICACIDVEN in the last 168 hours.   Recent  Results (from the past 240 hour(s))  Urine Culture     Status: Abnormal   Collection Time: 07/31/21  2:20 PM   Specimen: Urine, Clean Catch  Result Value Ref Range Status   Specimen Description   Final    URINE, CLEAN CATCH Performed at [facility], [address] [place], [place]  [address]    Special Requests   Final    NONE Performed at [facility], [place] [address], [place], [place] [address]    Culture (A)  Final    <10,000 COLONIES/mL INSIGNIFICANT GROWTH Performed at [place] [address] [place], [place] [address]    Report Status 08/02/2021 FINAL  Final  Expectorated Sputum Assessment w Gram Stain, Rflx to Resp Cult     Status: None   Collection Time: 08/02/21 10:15 AM   Specimen: Expectorated Sputum  Result Value Ref Range Status   Specimen Description EXPECTORATED SPUTUM  Final   Special Requests NONE  Final   Sputum evaluation   Final    THIS SPECIMEN IS ACCEPTABLE FOR SPUTUM CULTURE Performed at [facility], [place] [address], [place], [place] [address]    Report Status 08/02/2021 FINAL  Final  Culture, Respiratory w Gram Stain     Status: None   Collection Time: 08/02/21 10:15 AM  Result Value Ref Range Status   Specimen Description   Final    EXPECTORATED SPUTUM Performed at [facility], [place] [address] [place], [place] [address]    Special Requests   Final    NONE Reflexed from [id number] Performed at [facility], [place] [address] [place], [place] [address]    Gram Stain   Final    FEW WBC PRESENT, PREDOMINANTLY PMN FEW GRAM NEGATIVE COCCOBACILLI    Culture   Final    FEW Normal respiratory flora-no Staph aureus or Pseudomonas seen Performed at [place] [address] [place], [place] [address]    Report Status 08/04/2021 FINAL  Final         Radiology Studies: No results found.      Scheduled Meds:  (feeding supplement)  PROSource Plus  30 mL Oral BID BM   aspirin EC  81 mg Oral QHS   atorvastatin  40 mg Oral QHS   Chlorhexidine Gluconate Cloth  6 each Topical Daily   chlorpheniramine-HYDROcodone  5 mL Oral Q12H   enoxaparin (LOVENOX) injection  40 mg Subcutaneous Q24H   feeding supplement  1 Container Oral TID BM   ferrous sulfate  325 mg Oral Q breakfast   fluticasone  2 spray Each Nare Daily   guaiFENesin  1,200 mg Oral BID   insulin aspart  0-15 Units Subcutaneous TID WC   insulin aspart  0-5 Units Subcutaneous QHS   levothyroxine  75 mcg Oral Q0600   lidocaine  1 patch Transdermal Daily   mouth rinse  15 mL Mouth Rinse BID   multivitamin with minerals  1 tablet Oral Daily   pantoprazole  40 mg Oral Daily   revefenacin  175 mcg Nebulization Daily   senna-docusate  2 tablet Oral QHS   sodium chloride  2 spray Each Nare BID   sodium chloride flush  10 mL Other Q8H   sodium chloride flush  10-40 mL Intracatheter Q12H   Continuous Infusions:  cefTRIAXone (ROCEPHIN)  IV 2 g (08/09/21 1805)     LOS: 11 days    Time spent:35 mins, More than 50% of that time was spent in counseling and/or coordination of care.      Shelly Coss, MD [facility] P9/26/2022, 8:03 AM

## 2021-08-10 NOTE — Progress Notes (Signed)
PT Cancellation Note  Patient Details Name: William Farrell MRN: [id number] DOB: 06-25-1961   Cancelled Treatment:    Reason Eval/Treat Not Completed: Patient not medically ready;Medical issues which prohibited therapy (Pt with Afib rhythm and RVR with HR in 150's at rest. Will follow up when pt able and as schedule allows.)   Verner Mould, DPT [place] Office [phone] Pager [phone]

## 2021-08-10 NOTE — Progress Notes (Signed)
NAME:  William Farrell, MRN:  [id number], DOB:  1961/05/11, LOS: 55 ADMISSION DATE:  07/30/2021, CONSULTATION DATE:  9/15 REFERRING MD:  Dr Marylyn Ishihara, CHIEF COMPLAINT:  SOB/weakness   History of Present Illness:  60 year old male with prior history of Stage IIIa NSCLC (SCC) RUL diagnosed 02/2020 (RUL/suprahilar mass and R paratracheal and subcarinal LAD) s/p chemo XRT including keytruda, consolidation IT with durvalumab, SBRT to RUL nodules followed by Dr. Julien Nordmann. He is receiving neulasta support.   Presented to Dr. Julien Nordmann for follow-up, reportedly not felt well for 1-2 weeks, 15 lb weight loss since his prior visit, pain under right breast, worse with cough and progressive dyspnea on exertion even on his home O2.  He has had productive cough, orthopnea that has worsened over the last week. He has borrowed his wife's oxygen. A little hemoptysis, but no nausea, vomiting, or diarrhea.  No fever/chills.  He does acknowledge some trouble with aspiration of solids/liquids. Was due to start cycle #12 today.  Pertinent  Medical History  NSCLC stage IIIA (dx 02/2020)  Significant Hospital Events: Including procedures, antibiotic start and stop dates in addition to other pertinent events   Chest tube placement 07/30/2021 > Cytology pleural fluid >> no malignant cells Pleural fluid cx > pan-sensitive Strep intermedius  ABX: Max 9/15 -18 Vanc9/15 -16 Flagyl 9/15-18 Roceph 9/18 >>>   Scheduled Meds:  (feeding supplement) PROSource Plus  30 mL Oral BID BM   aspirin EC  81 mg Oral QHS   atorvastatin  40 mg Oral QHS   Chlorhexidine Gluconate Cloth  6 each Topical Daily   chlorpheniramine-HYDROcodone  5 mL Oral Q12H   enoxaparin (LOVENOX) injection  40 mg Subcutaneous Q24H   feeding supplement  1 Container Oral TID BM   ferrous sulfate  325 mg Oral Q breakfast   fluticasone  2 spray Each Nare Daily   guaiFENesin  1,200 mg Oral BID   insulin aspart  0-15 Units Subcutaneous TID WC   insulin aspart   0-5 Units Subcutaneous QHS   levothyroxine  75 mcg Oral Q0600   lidocaine  1 patch Transdermal Daily   mouth rinse  15 mL Mouth Rinse BID   multivitamin with minerals  1 tablet Oral Daily   pantoprazole  40 mg Oral Daily   revefenacin  175 mcg Nebulization Daily   senna-docusate  2 tablet Oral QHS   sodium chloride  2 spray Each Nare BID   sodium chloride flush  10 mL Other Q8H   sodium chloride flush  10-40 mL Intracatheter Q12H   Continuous Infusions:  cefTRIAXone (ROCEPHIN)  IV 2 g (08/09/21 1805)   diltiazem (CARDIZEM) infusion 5 mg/hr (08/10/21 1338)   PRN Meds:.acetaminophen **OR** acetaminophen, dextrose, hydrALAZINE, HYDROmorphone (DILAUDID) injection, ipratropium-albuterol, loratadine, methocarbamol, metoprolol tartrate, ondansetron **OR** ondansetron (ZOFRAN) IV, oxyCODONE-acetaminophen, phenol, sodium chloride flush, traZODone   Interim History / Subjective:  Still have right side pleuritic pain, worse with coughing, which is still productive R pigtail output 120->  110-> up today at 233ml/ 24hrs Afebrile WBC 24.2-> 25.1 Back in Afib with RVR today, primary team restarting cardizem gtt  Objective   Blood pressure 118/69, pulse 83, temperature 98 F (36.7 C), temperature source Oral, resp. rate 20, height 6' (1.829 m), weight 91.9 kg, SpO2 96 %.        Intake/Output Summary (Last 24 hours) at 08/10/2021 1411 Last data filed at 08/10/2021 0600 Gross per 24 hour  Intake 360 ml  Output 650 ml  Net -290  ml   Filed Weights   07/30/21 1101 07/30/21 1133 08/06/21 1255  Weight: 95.4 kg 88.9 kg 91.9 kg    Examination: General:  chronically ill male sitting upright in bed in NAD HEENT: MM pink/moist Neuro: Alert/ oriented, MAE CV: irir, port left chest- accessed PULM:  non labored, tachypneic at times, left clear, right scattered rhonchi midlung otherwise diminished, right posterior pigtail to 20cm suction, no airleak, flushes well, ongoing thin yellowish-green  drainage GI: soft, bs+ Extremities: warm/dry   Resolved Hospital Problem list    Assessment & Plan:   Right Strep intermedius empyema with chest tube in place 9/15.  Continues to have yellow purulent material draining from chest tube, slightly thinner but still purulent Persistent R pneumothorax, trapped lung - Continue saline flushes to ensure chest tube patency - Pain control with chest tube in place - push PT/OT/OOB - abx as per flowsheet above, day 9x/ ceftriaxone - f/u CXR today overall stable - will repeat pleural fluid LDH today - output ideally needs to be <170ml/24hrs prior to removal   Non-small cell lung cancer, currently undergoing therapy.  Concern for possible progressive disease most recent CT.  Pleural fluid cytology negative - Current therapy is on hold since he had acute infection.  Oncology following  Persistent cough, purulent sputum - Respiratory culture normal flora - Continue nasal saline, nasal steroids   - pulmonary hygiene/ PT/ IS/ flutter   Atrial fibrillation with RVR,  Was transient in the setting of acute infection - On enoxaparin prophylactic dose - Cardizem per primary team   Diabetes mellitus type 2 -As per [facility] plans  Best Practice (right click and "Reselect all SmartList Selections" daily)   Diet/type: Regular consistency (see orders) DVT prophylaxis:  GI prophylaxis: PPI Lines: N/A Foley:  N/A Code Status:  full code Last date of multidisciplinary goals of care discussion [Per primary] last seen by PMT 9/20, remains full code Family: Updated the patient and his wife at bedside  9/26 afternoon Disposition >>tele  Labs   CBC: Recent Labs  Lab 08/04/21 0542 08/05/21 0533 08/06/21 0424 08/07/21 0347 08/09/21 0420  WBC 24.4* 24.5* 23.1* 24.2* 25.1*  NEUTROABS 21.7* 21.7* 20.1* 20.9* 22.2*  HGB 9.1* 9.8* 9.0* 9.0* 8.8*  HCT 30.0* 31.7* 29.6* 29.7* 29.5*  MCV 92.0 93.0 94.0 94.3 93.1  PLT 374 385 349 347 362    Basic  Metabolic Panel: Recent Labs  Lab 08/04/21 0542 08/06/21 0424 08/07/21 0347 08/09/21 0420  NA 137  --  136 143  K 4.5  --  4.0 4.3  CL 106  --  104 109  CO2 23  --  22 23  GLUCOSE 137*  --  81 94  BUN 16  --  9 9  CREATININE 0.78 0.76 0.66 0.77  CALCIUM 9.0  --  8.7* 9.2  MG 1.7  --   --   --    GFR: Estimated Creatinine Clearance: 107.8 mL/min (by C-G formula based on SCr of 0.77 mg/dL). Recent Labs  Lab 08/05/21 0533 08/06/21 0424 08/07/21 0347 08/09/21 0420  WBC 24.5* 23.1* 24.2* 25.1*    Liver Function Tests: No results for input(s): AST, ALT, ALKPHOS, BILITOT, PROT, ALBUMIN in the last 168 hours.  No results for input(s): LIPASE, AMYLASE in the last 168 hours. No results for input(s): AMMONIA in the last 168 hours.  ABG    Component Value Date/Time   HCO3 18.6 (L) 07/30/2021 2235   ACIDBASEDEF 5.5 (H) 07/30/2021 2235   O2SAT  86.8 07/30/2021 2235     Coagulation Profile: No results for input(s): INR, PROTIME in the last 168 hours.   CBG: Recent Labs  Lab 08/09/21 1231 08/09/21 1729 08/09/21 2215 08/10/21 0817 08/10/21 1144  [place], ACNP [facility] 08/10/2021, 2:12 PM  See Amion for pager If no response to pager, please call PCCM consult pager After 7:00 pm call Elink

## 2021-08-11 DIAGNOSIS — I4891 Unspecified atrial fibrillation: Secondary | ICD-10-CM | POA: Diagnosis not present

## 2021-08-11 DIAGNOSIS — J869 Pyothorax without fistula: Secondary | ICD-10-CM | POA: Diagnosis not present

## 2021-08-11 DIAGNOSIS — Z7189 Other specified counseling: Secondary | ICD-10-CM | POA: Diagnosis not present

## 2021-08-11 LAB — CBC WITH DIFFERENTIAL/PLATELET
Abs Immature Granulocytes: 0.28 10*3/uL — ABNORMAL HIGH (ref 0.00–0.07)
Basophils Absolute: 0.1 10*3/uL (ref 0.0–0.1)
Basophils Relative: 0 %
Eosinophils Absolute: 0.1 10*3/uL (ref 0.0–0.5)
Eosinophils Relative: 0 %
HCT: 29 % — ABNORMAL LOW (ref 39.0–52.0)
Hemoglobin: 8.5 g/dL — ABNORMAL LOW (ref 13.0–17.0)
Immature Granulocytes: 1 %
Lymphocytes Relative: 3 %
Lymphs Abs: 0.9 10*3/uL (ref 0.7–4.0)
MCH: 27.8 pg (ref 26.0–34.0)
MCHC: 29.3 g/dL — ABNORMAL LOW (ref 30.0–36.0)
MCV: 94.8 fL (ref 80.0–100.0)
Monocytes Absolute: 2.4 10*3/uL — ABNORMAL HIGH (ref 0.1–1.0)
Monocytes Relative: 8 %
Neutro Abs: 24.7 10*3/uL — ABNORMAL HIGH (ref 1.7–7.7)
Neutrophils Relative %: 88 %
Platelets: 382 10*3/uL (ref 150–400)
RBC: 3.06 MIL/uL — ABNORMAL LOW (ref 4.22–5.81)
RDW: 19.7 % — ABNORMAL HIGH (ref 11.5–15.5)
WBC: 28.3 10*3/uL — ABNORMAL HIGH (ref 4.0–10.5)
nRBC: 0 % (ref 0.0–0.2)

## 2021-08-11 LAB — GLUCOSE, CAPILLARY
Glucose-Capillary: 165 mg/dL — ABNORMAL HIGH (ref 70–99)
Glucose-Capillary: 145 mg/dL — ABNORMAL HIGH (ref 70–99)
Glucose-Capillary: 132 mg/dL — ABNORMAL HIGH (ref 70–99)
Glucose-Capillary: 137 mg/dL — ABNORMAL HIGH (ref 70–99)

## 2021-08-11 MED ORDER — DILTIAZEM HCL ER COATED BEADS 120 MG PO CP24
120.0000 mg | ORAL_CAPSULE | Freq: Every day | ORAL | Status: DC
  Filled 2021-08-11: qty 1

## 2021-08-11 NOTE — Progress Notes (Addendum)
PROGRESS NOTE    William Farrell  OMV:[id number] DOB: [birth year] DOA: 07/30/2021 PCP: [name], DO   Chief Complain: Fatigue, poor oral intake  Brief Narrative: Patient is a 60 year old male with history of stage IIIa non-small cell lung cancer currently on chemotherapy, hyperlipidemia, diabetes type 2 who presented here with complaints of fatigue, poor oral intake for 2 weeks before admission.  On presentation he was found to be in A. fib with RVR, DKA and also found to have loculated right-sided pleural effusion.  PCCM was consulted on presentation,now status post right chest tube placement.  Hospital course was prolonged due to significant purulent drainage from the chest tube  PCCM, oncology, palliative care following  Assessment & Plan:   Principal Problem:   Empyema of right pleural space (HCC) Active Problems:   Type 2 diabetes mellitus without complication, without long-term current use of insulin (HCC)   Malignant neoplasm of right upper lobe of lung (HCC)   Atrial fibrillation with rapid ventricular response (HCC)   Malnutrition of moderate degree   Sepsis secondary to loculated right-sided pleural effusion: Presented with shortness of breath.  Status post chest tube placement on 9/15.  Procalcitonin was elevated.   He was on broad-spectrum antibiotics with vancomycin, cefepime, Flagyl.  Pleural fluid culture showing streptococcus intermedius, pansensitive.  Blood cultures are negative . Antibiotic has been changed to ceftriaxone,day 10.  Sputum culture showed few gram-negative coccobacilli. Chest tube is still draining significant purulent fluid.The  plan  is to remove the chest tube when the drain is less than 50-100 cc. Still has significant leukocytosis,but  afebrile.  His leukocytosis is contributed  likely secondary to malignancy. Last follow-up chest x-ray showed  residual right pleural effusion and loculated pneumothorax at the right apex.   Plan is to do CT  chest without contrast today.  Stage III non-small cell lung cancer: Follows with Dr. Julien Nordmann, he was being treated with chemo.   Oncology has been following. Chest CT done on 9.18.22 showed  extensive, diffuse pleural thickening and adjacent interlobular septal thickening with a small, loculated hydropneumothorax, approximately 10% volume,perihilar and suprahilar right lung malignancy with adjacent satellite nodularity,multiple small pulmonary nodules throughout the lungs consistent with worsening pulmonary metastatic disease. Pleural fluid cytology negative for malignancy. Dr. Julien Nordmann talked to the patient today and gave option on continuing chemotherapy versus hospice.  Family will get back after discussing among themselves.  Palliative  care also closely following  A. fib with RVR: 2 episodes.Transient, now resolved after starting on Cardizem drip.  Currently in normal sinus rhythm.  TSH normal.  Echocardiogram showed EF of 60 to 65%, normal left ventricular diastolic parameters.  Not a started on anticoagulation because of transient nature of the A. fib with RVR/likely related to pulmonary issues. CHA2DS2VASc score of 1.  Currently on aspirin.  Patient's BP remains soft,no room to start on rate control meds.If BP remains good,we can consider starting on low dose diltiazem.  DKA/diabetes type 2: Initially started on insulin drip now transitioned to subcutaneous insulin.  Takes metformin, glipizide, Ozempic at home.  Monitor blood sugars.  Continue current insulin regimen.  Hemoglobin A1c of 6.6.  Sugars were rrunning low so Lantus discontinued.  AKI on CKD stage IIIa: Creatinine was in the range of 2 on presentation.  Baseline creatinine around 1.3.  Currently kidney function is normal.  Chronic normocytic anemia: Baseline hemoglobin around 8.   No evidence of acute blood loss.  Most likely associated with underlying malignancy, chemotherapy.  Iron studies  showed low iron,given IV iron infusion.   Transfused with a unit of PRBC on 08/02/2021.  Hyperlipidemia: On statin  Hypothyroidism: On Synthyroid  Moderate malnutrition: Secondary to malignancy.  Nutrition following  Debility/deconditioning: PT/OT evaluated the patient and recommend home health on discharge.  Nutrition Problem: Moderate Malnutrition Etiology: chronic illness, cancer and cancer related treatments      DVT prophylaxis:Lovenox Code Status: Full Family Communication: Wife  at bedside on 9/26 Status is: Inpatient  Remains inpatient appropriate because:Inpatient level of care appropriate due to severity of illness  Dispo: The patient is from: Home              Anticipated d/c is to: Home              Patient currently is not medically stable to d/c.   Difficult to place patient No    Consultants: Oncology,PCCM, palliative care  Procedures:Chest tube placement  Antimicrobials:  Anti-infectives (From admission, onward)    Start     Dose/Rate Route Frequency Ordered Stop   08/05/21 1800  cefTRIAXone (ROCEPHIN) 2 g in sodium chloride 0.9 % 100 mL IVPB        2 g 200 mL/hr over 30 Minutes Intravenous Every 24 hours 08/05/21 1316     08/02/21 1800  cefTRIAXone (ROCEPHIN) 2 g in sodium chloride 0.9 % 100 mL IVPB  Status:  Discontinued        2 g 200 mL/hr over 30 Minutes Intravenous Every 24 hours 08/02/21 0947 08/05/21 1316   08/02/21 1100  vancomycin (VANCOREADY) IVPB 1250 mg/250 mL  Status:  Discontinued        1,250 mg 166.7 mL/hr over 90 Minutes Intravenous Every 12 hours 08/02/21 0726 08/02/21 0910   08/01/21 1100  vancomycin (VANCOCIN) IVPB 1000 mg/200 mL premix  Status:  Discontinued        1,000 mg 200 mL/hr over 60 Minutes Intravenous Every 12 hours 08/01/21 0951 08/02/21 0726   07/31/21 1800  vancomycin (VANCOREADY) IVPB 1250 mg/250 mL  Status:  Discontinued        1,250 mg 166.7 mL/hr over 90 Minutes Intravenous Every 24 hours 07/30/21 1911 07/31/21 0845   07/31/21 1800  vancomycin  (VANCOREADY) IVPB 1500 mg/300 mL  Status:  Discontinued        1,500 mg 150 mL/hr over 120 Minutes Intravenous Every 24 hours 07/31/21 0845 08/01/21 0951   07/31/21 0945  ceFEPIme (MAXIPIME) 2 g in sodium chloride 0.9 % 100 mL IVPB  Status:  Discontinued        2 g 200 mL/hr over 30 Minutes Intravenous Every 8 hours 07/31/21 0845 08/02/21 0948   07/30/21 2000  metroNIDAZOLE (FLAGYL) IVPB 500 mg  Status:  Discontinued        500 mg 100 mL/hr over 60 Minutes Intravenous Every 12 hours 07/30/21 1738 08/03/21 1019   07/30/21 1830  vancomycin (VANCOREADY) IVPB 1750 mg/350 mL        1,750 mg 175 mL/hr over 120 Minutes Intravenous  Once 07/30/21 1738 07/30/21 1846   07/30/21 1800  ceFEPIme (MAXIPIME) 2 g in sodium chloride 0.9 % 100 mL IVPB  Status:  Discontinued        2 g 200 mL/hr over 30 Minutes Intravenous Every 12 hours 07/30/21 1723 07/31/21 0845       Subjective:  Patient seen and examined at the bedside this morning.  He looks significantly comfortable than yesterday.  Cough has improved.  He is on room air.  Chest tube  still draining yellow purulent fluid.   Objective: Vitals:   08/10/21 1251 08/10/21 1514 08/10/21 2316 08/11/21 0331  BP: 118/69 110/71 100/68 118/61  Pulse: 83 (!) 115 87 97  Resp: 20  20 17   Temp: 98 F (36.7 C)  97.9 F (36.6 C) 98.6 F (37 C)  TempSrc: Oral  Oral Oral  SpO2:   98%   Weight:      Height:        Intake/Output Summary (Last 24 hours) at 08/11/2021 0806 Last data filed at 08/11/2021 0600 Gross per 24 hour  Intake 218.83 ml  Output 360 ml  Net -141.17 ml   Filed Weights   07/30/21 1101 07/30/21 1133 08/06/21 1255  Weight: 95.4 kg 88.9 kg 91.9 kg    Examination:  General exam: Chronically ill looking, overall appears comfortable today HEENT: PERRL Respiratory system:  no wheezes or crackles, chest tube on the right side draining purulent material Cardiovascular system: S1 & S2 heard, RRR.  Chemo-Port on the left  chest Gastrointestinal system: Abdomen is nondistended, soft and nontender. Central nervous system: Alert and oriented Extremities: No edema, no clubbing ,no cyanosis Skin: No rashes, no ulcers,no icterus    Data Reviewed: I have personally reviewed following labs and imaging studies  CBC: Recent Labs  Lab 08/05/21 0533 08/06/21 0424 08/07/21 0347 08/09/21 0420 08/11/21 0311  WBC 24.5* 23.1* 24.2* 25.1* 28.3*  NEUTROABS 21.7* 20.1* 20.9* 22.2* 24.7*  HGB 9.8* 9.0* 9.0* 8.8* 8.5*  HCT 31.7* 29.6* 29.7* 29.5* 29.0*  MCV 93.0 94.0 94.3 93.1 94.8  PLT 385 349 347 362 619   Basic Metabolic Panel: Recent Labs  Lab 08/06/21 0424 08/07/21 0347 08/09/21 0420  NA  --  136 143  K  --  4.0 4.3  CL  --  104 109  CO2  --  22 23  GLUCOSE  --  81 94  BUN  --  9 9  CREATININE 0.76 0.66 0.77  CALCIUM  --  8.7* 9.2   GFR: Estimated Creatinine Clearance: 107.8 mL/min (by C-G formula based on SCr of 0.77 mg/dL). Liver Function Tests: No results for input(s): AST, ALT, ALKPHOS, BILITOT, PROT, ALBUMIN in the last 168 hours.  No results for input(s): LIPASE, AMYLASE in the last 168 hours. No results for input(s): AMMONIA in the last 168 hours. Coagulation Profile: No results for input(s): INR, PROTIME in the last 168 hours.  Cardiac Enzymes: No results for input(s): CKTOTAL, CKMB, CKMBINDEX, TROPONINI in the last 168 hours. BNP (last 3 results) No results for input(s): PROBNP in the last 8760 hours. HbA1C: No results for input(s): HGBA1C in the last 72 hours.  CBG: Recent Labs  Lab 08/10/21 0817 08/10/21 1144 08/10/21 1739 08/10/21 2050 08/11/21 0740  GLUCAP 104* 161* 111* 137* 165*   Lipid Profile: No results for input(s): CHOL, HDL, LDLCALC, TRIG, CHOLHDL, LDLDIRECT in the last 72 hours. Thyroid Function Tests: No results for input(s): TSH, T4TOTAL, FREET4, T3FREE, THYROIDAB in the last 72 hours.  Anemia Panel: No results for input(s): VITAMINB12, FOLATE, FERRITIN,  TIBC, IRON, RETICCTPCT in the last 72 hours.  Sepsis Labs: No results for input(s): PROCALCITON, LATICACIDVEN in the last 168 hours.   Recent Results (from the past 240 hour(s))  Expectorated Sputum Assessment w Gram Stain, Rflx to Resp Cult     Status: None   Collection Time: 08/02/21 10:15 AM   Specimen: Expectorated Sputum  Result Value Ref Range Status   Specimen Description EXPECTORATED SPUTUM  Final  Special Requests NONE  Final   Sputum evaluation   Final    THIS SPECIMEN IS ACCEPTABLE FOR SPUTUM CULTURE Performed at [facility] [address] [place], [place] [address]    Report Status 08/02/2021 FINAL  Final  Culture, Respiratory w Gram Stain     Status: None   Collection Time: 08/02/21 10:15 AM  Result Value Ref Range Status   Specimen Description   Final    EXPECTORATED SPUTUM Performed at [facility], [place] [address] [place], [place] [address]    Special Requests   Final    NONE Reflexed from [id number] Performed at [facility], [place] [address], [place], [place] [address]    Gram Stain   Final    FEW WBC PRESENT, PREDOMINANTLY PMN FEW GRAM NEGATIVE COCCOBACILLI    Culture   Final    FEW Normal respiratory flora-no Staph aureus or Pseudomonas seen Performed at [facility] [address], [place], [place] [address]    Report Status 08/04/2021 FINAL  Final         Radiology Studies: DG Chest 1 View  Result Date: 08/10/2021 CLINICAL DATA:  Stage III A non-small cell lung cancer on chemotherapy, hyperlipidemia, type II diabetes mellitus, atrial fibrillation, presents with fatigue and poor oral intake for 2 weeks EXAM: CHEST  1 VIEW COMPARISON:  Portable exam 0830 hours compared to 08/06/2021 FINDINGS: LEFT jugular Port-A-Cath with tip projecting over cavoatrial junction. RIGHT thoracostomy tube unchanged. Stable heart size and mediastinal contours. Bibasilar effusions and atelectasis. Large area of  opacity in the upper LEFT lung corresponding to known tumor and accompanying atelectasis. Small loculated RIGHT apex pneumothorax despite thoracostomy tube, unchanged. IMPRESSION: No interval change in small bibasilar pleural effusions and bibasilar atelectasis, RIGHT thoracostomy tube with persistent small RIGHT apex pneumothorax, and known RIGHT perihilar/RIGHT upper lobe neoplasm. Electronically Signed   By: Lavonia Dana M.D.   On: 08/10/2021 08:55   DG CHEST PORT 1 VIEW  Result Date: 08/10/2021 CLINICAL DATA:  Chest tube follow-up empyema EXAM: PORTABLE CHEST 1 VIEW COMPARISON:  08/10/2021, CT 08/02/2021, chest x-ray 08/06/2021 FINDINGS: Left-sided central venous port tip over the cavoatrial region. Cardiomegaly. Probable small left effusion. Right-sided chest tube similar in position at the right base. Loculated pneumothorax at the right apex without significant change. Slight increased opacity at the right mid lung could reflect increased fluid at the pulmonary fissure. Dense right apical opacity as before. IMPRESSION: 1. Overall no great change in appearance of the chest as compared with radiograph earlier today. Residual right pleural effusion and loculated pneumothorax at the right apex. Right midlung opacity is increased compared from 09/22 and may represent increased fluid at pulmonary fissure. 2. Cardiomegaly and small left-sided effusion. Aeration at left lung base is slightly improved. Electronically Signed   By: Donavan Foil M.D.   On: 08/10/2021 15:59        Scheduled Meds:  (feeding supplement) PROSource Plus  30 mL Oral BID BM   aspirin EC  81 mg Oral QHS   atorvastatin  40 mg Oral QHS   Chlorhexidine Gluconate Cloth  6 each Topical Daily   chlorpheniramine-HYDROcodone  5 mL Oral Q12H   enoxaparin (LOVENOX) injection  40 mg Subcutaneous Q24H   feeding supplement  1 Container Oral TID BM   ferrous sulfate  325 mg Oral Q breakfast   fluticasone  2 spray Each Nare Daily    guaiFENesin  1,200 mg Oral BID  insulin aspart  0-15 Units Subcutaneous TID WC   insulin aspart  0-5 Units Subcutaneous QHS   levothyroxine  75 mcg Oral Q0600   lidocaine  1 patch Transdermal Daily   mouth rinse  15 mL Mouth Rinse BID   multivitamin with minerals  1 tablet Oral Daily   pantoprazole  40 mg Oral Daily   revefenacin  175 mcg Nebulization Daily   senna-docusate  2 tablet Oral QHS   sodium chloride  2 spray Each Nare BID   sodium chloride flush  10 mL Other Q8H   sodium chloride flush  10-40 mL Intracatheter Q12H   Continuous Infusions:  cefTRIAXone (ROCEPHIN)  IV 2 g (08/10/21 1801)   diltiazem (CARDIZEM) infusion 10 mg/hr (08/11/21 0126)     LOS: 12 days    Time spent:35 mins, More than 50% of that time was spent in counseling and/or coordination of care.      Shelly Coss, MD [facility] P9/27/2022, 8:06 AM

## 2021-08-11 NOTE — Progress Notes (Addendum)
Physical Therapy Treatment Patient Details Name: William Farrell MRN: [id number] DOB: 1961-03-19 Today's Date: 08/11/2021   History of Present Illness Pt is 60 year old with history of stage IIIa non-small cell lung cancer on chemo who presents to the hospital for fatigue and poor oral intake for the past 2 weeks on 07/30/21.  He was found to be in atrial fibrillation with RVR, DKA and had loculated pleural effusion. Chest tube was inserted 07/30/2021. Pt with other hx of HLD and DM2    PT Comments    Pt was OOB in recliner with spouse at bedside.  General Comments: AxO x 3 cooperative but can get irritated.  Pt pale in facial color and lips. General transfer comment: pt appears pale and white.  sitting BP 118/71  HR 96.  25% VC's on proper hand placement to push up vs pull up on walker.  X 1 posterior LOB rising from recliner. Standing BP 96/85  HR 105.   Only took a few steps to bed then assisted back to bed.  MAX c/o feeling "cold".  3 warm blankets applied.General bed mobility comments: assist B LE up onto bed and management lines   Recommendations for follow up therapy are one component of a multi-disciplinary discharge planning process, led by the attending physician.  Recommendations may be updated based on patient status, additional functional criteria and insurance authorization.  Follow Up Recommendations  Home health PT;Supervision for mobility/OOB     Equipment Recommendations    RW + hospital bed (per spouse)   Recommendations for Other Services       Precautions / Restrictions Precautions Precautions: Fall Precaution Comments: R CT to wall suction Restrictions Weight Bearing Restrictions: No     Mobility  Bed Mobility Overal bed mobility: Needs Assistance Bed Mobility: Sit to Supine     Supine to sit: Supervision;HOB elevated Sit to supine: Min guard;Min assist   General bed mobility comments: assist B LE up onto bed and management lines     Transfers Overall transfer level: Needs assistance Equipment used: Rolling walker (2 wheeled) Transfers: Sit to/from Stand Sit to Stand: Min assist Stand pivot transfers: Min assist;Mod assist       General transfer comment: pt appears pale and white.  sitting BP 118/71  HR 96.  25% VC's on proper hand placement to push up vs pull up on walker.  X 1 posterior LOB rising from recliner. Standing BP 96/85  HR 105.   Only took a few steps to bed then assisted back to bed.  MAX c/o feeling "cold".  3 warm blankets applied.  Ambulation/Gait             General Gait Details: only took a few side steps back to bed due to drop in BP   Stairs             Wheelchair Mobility    Modified Rankin (Stroke Patients Only)       Balance                                            Cognition Arousal/Alertness: Awake/alert Behavior During Therapy: WFL for tasks assessed/performed Overall Cognitive Status: Within Functional Limits for tasks assessed  General Comments: AxO x 3 cooperative but can get irritated      Exercises      General Comments        Pertinent Vitals/Pain Pain Assessment: Faces Faces Pain Scale: Hurts a little bit Pain Location: chest and back Pain Descriptors / Indicators: Grimacing;Discomfort Pain Intervention(s): Monitored during session;Repositioned    Home Living                      Prior Function            PT Goals (current goals can now be found in the care plan section) Progress towards PT goals: Progressing toward goals    Frequency    Min 3X/week      PT Plan Current plan remains appropriate    Co-evaluation              AM-PAC PT "6 Clicks" Mobility   Outcome Measure  Help needed turning from your back to your side while in a flat bed without using bedrails?: A Little Help needed moving from lying on your back to sitting on the side of a  flat bed without using bedrails?: A Little Help needed moving to and from a bed to a chair (including a wheelchair)?: A Little Help needed standing up from a chair using your arms (e.g., wheelchair or bedside chair)?: A Little Help needed to walk in hospital room?: A Little Help needed climbing 3-5 steps with a railing? : A Lot 6 Click Score: 17    End of Session Equipment Utilized During Treatment: Gait belt Activity Tolerance: Patient tolerated treatment well Patient left: in bed;with bed alarm set;with family/visitor present;with call bell/phone within reach   PT Visit Diagnosis: Other abnormalities of gait and mobility (R26.89);Muscle weakness (generalized) (M62.81)     Time: 3013-1438 PT Time Calculation (min) (ACUTE ONLY): 22 min  Charges:  $Therapeutic Activity: 8-22 mins                     {Maanvi Lecompte  PTA Acute  Rehabilitation Services Pager      [phone] Office      [phone]

## 2021-08-11 NOTE — Progress Notes (Signed)
Occupational Therapy Treatment Patient Details Name: William Farrell MRN: [id number] DOB: [birth year] Today's Date: 08/11/2021   History of present illness Pt is 60 year old with history of stage IIIa non-small cell lung cancer on chemo who presents to the hospital for fatigue and poor oral intake for the past 2 weeks on 07/30/21.  He was found to be in atrial fibrillation with RVR, DKA and had loculated pleural effusion. Chest tube was inserted 07/30/2021. Pt with other hx of HLD and DM2   OT comments  Patient required increased time to participate in all tasks with education provided on set up in room for chest tube to remain connected to wall. Patient was able to don bilateral socks sitting on edge of bed with min guard. Patient's discharge plan remains appropriate at this time. OT will continue to follow acutely.     Recommendations for follow up therapy are one component of a multi-disciplinary discharge planning process, led by the attending physician.  Recommendations may be updated based on patient status, additional functional criteria and insurance authorization.    Follow Up Recommendations  No OT follow up    Equipment Recommendations  Other (comment)    Recommendations for Other Services      Precautions / Restrictions Precautions Precautions: Fall Precaution Comments: R CT to wall suction Restrictions Weight Bearing Restrictions: No       Mobility Bed Mobility Overal bed mobility: Needs Assistance Bed Mobility: Supine to Sit     Supine to sit: Supervision;HOB elevated          Transfers                      Balance                                           ADL either performed or assessed with clinical judgement   ADL Overall ADL's : Needs assistance/impaired                     Lower Body Dressing: Sitting/lateral leans Lower Body Dressing Details (indicate cue type and reason): sitting on edge of bed to don  bilateral socks with education on patient attempting tasks prior to wife completing them for patient, Toilet Transfer: Min Insurance claims handler Details (indicate cue type and reason): patient agreed to getting out of bed on this date with min guard to transfer from edge of bed to recliner in room with education on proper hand placement.         Functional mobility during ADLs: Min guard;Rolling walker       Vision Patient Visual Report: No change from baseline     Perception     Praxis      Cognition Arousal/Alertness: Awake/alert Behavior During Therapy: WFL for tasks assessed/performed Overall Cognitive Status: Within Functional Limits for tasks assessed                                 General Comments: no obvious cognitive deficits, flat and irritable affect,        Exercises     Shoulder Instructions       General Comments      Pertinent Vitals/ Pain       Pain Assessment: Faces Faces Pain Scale: Hurts little more Pain Location: chest and back  Pain Descriptors / Indicators: Grimacing;Discomfort Pain Intervention(s): Limited activity within patient's tolerance;Monitored during session  Home Living                                          Prior Functioning/Environment              Frequency  Min 2X/week        Progress Toward Goals  OT Goals(current goals can now be found in the care plan section)  Progress towards OT goals: Progressing toward goals     Plan Discharge plan remains appropriate    Co-evaluation                 AM-PAC OT "6 Clicks" Daily Activity     Outcome Measure   Help from another person eating meals?: None Help from another person taking care of personal grooming?: A Little Help from another person toileting, which includes using toliet, bedpan, or urinal?: A Little Help from another person bathing (including washing, rinsing, drying)?: A Lot Help from another  person to put on and taking off regular upper body clothing?: A Little Help from another person to put on and taking off regular lower body clothing?: A Little 6 Click Score: 18    End of Session Equipment Utilized During Treatment: Rolling walker  OT Visit Diagnosis: Muscle weakness (generalized) (M62.81);Pain   Activity Tolerance Patient tolerated treatment well   Patient Left in chair;with call bell/phone within reach;with family/visitor present   Nurse Communication Other (comment) (nurse cleared patient to participate. made aware of patients request to have chair closer to window with chest tube not able to reach)        Time: 3016-0109 OT Time Calculation (min): 29 min  Charges: OT General Charges $OT Visit: 1 Visit OT Treatments $Self Care/Home Management : 23-37 mins  Jackelyn Poling OTR/L, MS Acute Rehabilitation Department Office# [phone] Pager# [phone]   [place] 08/11/2021, 11:43 AM

## 2021-08-11 NOTE — Progress Notes (Signed)
Speech Language Pathology Treatment: Dysphagia  Patient Details Name: William Farrell MRN: [id number] DOB: [birth year] Today's Date: 08/11/2021 Time: 5035-4656 SLP Time Calculation (min) (ACUTE ONLY): 15 min  Assessment / Plan / Recommendation Clinical Impression  Pt and wife were seen at bedside to discuss option of proceeding with MBS. Pt/wife were receptive to education regarding the rationale for this assessment, however, pt continues to decline participation in MBS until chest tube is removed. Will continue efforts.    HPI HPI: Patient is a 60 year old male with history of stage IIIa non-small cell lung cancer currently on chemotherapy, hyperlipidemia, diabetes type 2 who presented here with complaints of fatigue, poor oral intake for 2 weeks before admission.  On presentation he was found to be in A. fib with RVR, DKA and also found to have loculated right-sided pleural effusion.  PCCM was consulted on presentation, status post right chest tube placement.  Currently on broad-spectrum antibiotics for loculated right-sided pleural effusion.      SLP Plan  Continue with current plan of care      Recommendations for follow up therapy are one component of a multi-disciplinary discharge planning process, led by the attending physician.  Recommendations may be updated based on patient status, additional functional criteria and insurance authorization.    Recommendations  Medication Administration: Whole meds with puree Compensations: Slow rate;Small sips/bites;Multiple dry swallows after each bite/sip Postural Changes and/or Swallow Maneuvers: Seated upright 90 degrees                Oral Care Recommendations: Oral care BID SLP Visit Diagnosis: Dysphagia, unspecified (R13.10) Plan: Continue with current plan of care       [place]. Quentin Ore, [facility], [place] Speech Language Pathologist Office: [phone]  Shonna Chock  08/11/2021, 11:47 AM

## 2021-08-11 NOTE — Progress Notes (Signed)
NAME:  William Farrell, MRN:  [id number], DOB:  23-Aug-1961, LOS: 12 ADMISSION DATE:  07/30/2021, CONSULTATION DATE:  9/15 REFERRING MD:  Marylyn Ishihara, CHIEF COMPLAINT:  Dyspnea, weakness   History of Present Illness:  60 y/o male with known RUL lung cancer who was admitted in the setting of dyspnea and a pleural effusion.  He was found to have an empyema in the right lung when a pigtail catheter was placed.   Pertinent  Medical History  NSCLC stage IIIA (dx 02/2020)> s/p XRT, keytruda Chemotherapy induced leukopenia> on neulasta GERD DM2 Diverticulitis Kidney stones Hyperlipidemia Asthma  Significant Hospital Events: Including procedures, antibiotic start and stop dates in addition to other pertinent events   Chest tube placement 07/30/2021 > Cytology pleural fluid >> no malignant cells  Imaging: 9/18 CT chest > pigtail chest tube in place, extensive, diffuse pleural thickening and adjacent interlobular septal thickening, small loculated hydropneumothorax approximately 10% volume.  New trace pleural effusion, similar post treatment appearance of lung mass compared to prior, multiple small pulmonary nodules  Micro Pleural fluid cx > pan-sensitive Strep intermedius  Abx: Cefepime 9/15 -18 Vanc 9/15 -16 Flagyl 9/15-18 Rocephin 9/18 >>>   Interim History / Subjective:  Feels miserable Minimal dyspnea  Objective   Blood pressure 102/64, pulse 92, temperature (!) 97.4 F (36.3 C), resp. rate 20, height 6' (1.829 m), weight 91.9 kg, SpO2 93 %.        Intake/Output Summary (Last 24 hours) at 08/11/2021 1210 Last data filed at 08/11/2021 1017 Gross per 24 hour  Intake 228.83 ml  Output 360 ml  Net -131.17 ml   Filed Weights   07/30/21 1101 07/30/21 1133 08/06/21 1255  Weight: 95.4 kg 88.9 kg 91.9 kg    Examination:  General:  Sitting up in chair HENT: NCAT OP clear PULM: Diminished right lung, clear on left, normal effort CV: RRR, no mgr GI: BS+, soft, nontender MSK:  normal bulk and tone Neuro: awake, alert, [facility] Problem list     Assessment & Plan:  Right empyema in setting of NSCLC on right with associated hydropneumothorax: given persistent fluid output and increase in fluid out put in the last 48 hours I am worried that he has a lung abscess.  > check CT chest > if persistent loculated pleural effusion then will give TPA, if no evidence of undrained pleural fluid then will assume this is a lung abscess and will place a PICC and treat with long term antibiotics > continue chest tube to suction, flushing pigtail per protocol > continue ceftriaxone as ordered > f/u oncology recommendations  Prognosis poor  Best Practice (right click and "Reselect all SmartList Selections" daily)   Per [facility]  Labs   CBC: Recent Labs  Lab 08/05/21 0533 08/06/21 0424 08/07/21 0347 08/09/21 0420 08/11/21 0311  WBC 24.5* 23.1* 24.2* 25.1* 28.3*  NEUTROABS 21.7* 20.1* 20.9* 22.2* 24.7*  HGB 9.8* 9.0* 9.0* 8.8* 8.5*  HCT 31.7* 29.6* 29.7* 29.5* 29.0*  MCV 93.0 94.0 94.3 93.1 94.8  PLT 385 349 347 362 852    Basic Metabolic Panel: Recent Labs  Lab 08/06/21 0424 08/07/21 0347 08/09/21 0420  NA  --  136 143  K  --  4.0 4.3  CL  --  104 109  CO2  --  22 23  GLUCOSE  --  81 94  BUN  --  9 9  CREATININE 0.76 0.66 0.77  CALCIUM  --  8.7* 9.2   GFR: Estimated  Creatinine Clearance: 107.8 mL/min (by C-G formula based on SCr of 0.77 mg/dL). Recent Labs  Lab 08/06/21 0424 08/07/21 0347 08/09/21 0420 08/11/21 0311  WBC 23.1* 24.2* 25.1* 28.3*    Liver Function Tests: No results for input(s): AST, ALT, ALKPHOS, BILITOT, PROT, ALBUMIN in the last 168 hours. No results for input(s): LIPASE, AMYLASE in the last 168 hours. No results for input(s): AMMONIA in the last 168 hours.  ABG    Component Value Date/Time   HCO3 18.6 (L) 07/30/2021 2235   ACIDBASEDEF 5.5 (H) 07/30/2021 2235   O2SAT 86.8 07/30/2021 2235     Coagulation  Profile: No results for input(s): INR, PROTIME in the last 168 hours.  Cardiac Enzymes: No results for input(s): CKTOTAL, CKMB, CKMBINDEX, TROPONINI in the last 168 hours.  HbA1C: Hemoglobin A1C  Date/Time Value Ref Range Status  06/10/2021 08:32 AM 6.8 (A) 4.0 - 5.6 % Final  03/11/2021 08:37 AM 7.5 (A) 4.0 - 5.6 % Final   Hgb A1c MFr Bld  Date/Time Value Ref Range Status  07/31/2021 08:00 AM 6.6 (H) 4.8 - 5.6 % Final    Comment:    (NOTE) Pre diabetes:          5.7%-6.4%  Diabetes:              >6.4%  Glycemic control for   <7.0% adults with diabetes   01/14/2020 08:59 AM 7.1 (H) <5.7 % of total Hgb Final    Comment:    For someone without known diabetes, a hemoglobin A1c value of 6.5% or greater indicates that they may have  diabetes and this should be confirmed with a follow-up  test. . For someone with known diabetes, a value <7% indicates  that their diabetes is well controlled and a value  greater than or equal to 7% indicates suboptimal  control. A1c targets should be individualized based on  duration of diabetes, age, comorbid conditions, and  other considerations. . Currently, no consensus exists regarding use of hemoglobin A1c for diagnosis of diabetes for children. .     CBG: Recent Labs  Lab 08/10/21 1144 08/10/21 1739 08/10/21 2050 08/11/21 0740 08/11/21 1114  GLUCAP 161* 111* 137* 165* 145*      Critical care time: n/a    Roselie Awkward, MD [place] [facility] Pager: [phone] Cell: [phone] After 7:00 pm call Elink  [phone]

## 2021-08-11 NOTE — Progress Notes (Signed)
Subjective: The patient is seen and examined today.  He is feeling a little bit better but continues to have significant fatigue and weakness as well as shortness of breath.  He continues to have drainage from the right chest tube.  He has no current fever or chills.  He has no nausea, vomiting, diarrhea or constipation.  He requested reevaluation for discussion of his prognosis and treatment options.  Objective: Vital signs in last 24 hours: Temp:  [97.9 F (36.6 C)-98.6 F (37 C)] 98.6 F (37 C) (09/27 0331) Pulse Rate:  [83-115] 97 (09/27 0331) Resp:  [17-20] 17 (09/27 0331) BP: (100-118)/(61-71) 118/61 (09/27 0331) SpO2:  [97 %-98 %] 97 % (09/27 0813)  Intake/Output from previous day: 09/26 0701 - 09/27 0700 In: 228.8 [P.O.:60; I.V.:158.8] Out: 360 [Urine:300; Chest Tube:60] Intake/Output this shift: No intake/output data recorded.  General appearance: alert, cooperative, fatigued, and no distress Resp: diminished breath sounds RLL and dullness to percussion RLL Cardio: regular rate and rhythm, S1, S2 normal, no murmur, click, rub or gallop GI: soft, non-tender; bowel sounds normal; no masses,  no organomegaly Extremities: extremities normal, atraumatic, no cyanosis or edema  Lab Results:  Recent Labs    08/09/21 0420 08/11/21 0311  WBC 25.1* 28.3*  HGB 8.8* 8.5*  HCT 29.5* 29.0*  PLT 362 382   BMET Recent Labs    08/09/21 0420  NA 143  K 4.3  CL 109  CO2 23  GLUCOSE 94  BUN 9  CREATININE 0.77  CALCIUM 9.2    Studies/Results: DG Chest 1 View  Result Date: 08/10/2021 CLINICAL DATA:  Stage III A non-small cell lung cancer on chemotherapy, hyperlipidemia, type II diabetes mellitus, atrial fibrillation, presents with fatigue and poor oral intake for 2 weeks EXAM: CHEST  1 VIEW COMPARISON:  Portable exam 0830 hours compared to 08/06/2021 FINDINGS: LEFT jugular Port-A-Cath with tip projecting over cavoatrial junction. RIGHT thoracostomy tube unchanged. Stable heart  size and mediastinal contours. Bibasilar effusions and atelectasis. Large area of opacity in the upper LEFT lung corresponding to known tumor and accompanying atelectasis. Small loculated RIGHT apex pneumothorax despite thoracostomy tube, unchanged. IMPRESSION: No interval change in small bibasilar pleural effusions and bibasilar atelectasis, RIGHT thoracostomy tube with persistent small RIGHT apex pneumothorax, and known RIGHT perihilar/RIGHT upper lobe neoplasm. Electronically Signed   By: Lavonia Dana M.D.   On: 08/10/2021 08:55   DG CHEST PORT 1 VIEW  Result Date: 08/10/2021 CLINICAL DATA:  Chest tube follow-up empyema EXAM: PORTABLE CHEST 1 VIEW COMPARISON:  08/10/2021, CT 08/02/2021, chest x-ray 08/06/2021 FINDINGS: Left-sided central venous port tip over the cavoatrial region. Cardiomegaly. Probable small left effusion. Right-sided chest tube similar in position at the right base. Loculated pneumothorax at the right apex without significant change. Slight increased opacity at the right mid lung could reflect increased fluid at the pulmonary fissure. Dense right apical opacity as before. IMPRESSION: 1. Overall no great change in appearance of the chest as compared with radiograph earlier today. Residual right pleural effusion and loculated pneumothorax at the right apex. Right midlung opacity is increased compared from 09/22 and may represent increased fluid at pulmonary fissure. 2. Cardiomegaly and small left-sided effusion. Aeration at left lung base is slightly improved. Electronically Signed   By: Donavan Foil M.D.   On: 08/10/2021 15:59    Medications: I have reviewed the patient's current medications.   Assessment/Plan: This is a very pleasant 60 years old white male with recurrent non-small cell lung cancer initially diagnosed as  a stage IIIa (T2 a, N2, M0) non-small cell lung cancer, squamous cell carcinoma in April 2021 status post a course of concurrent chemoradiation with weekly  carboplatin and paclitaxel followed by 3 cycles of consolidation immunotherapy with Imfinzi discontinued secondary to disease progression.  The patient started systemic chemotherapy with carboplatin, paclitaxel and Keytruda for 3 cycles but this was also discontinued secondary to disease progression.  He underwent SBRT to the enlarging right upper lobe lung nodule under the care of Dr. Lisbeth Renshaw.  The patient started second line systemic chemotherapy with docetaxel and Cyramza status post 11 cycles.  He has a rough time tolerating this treatment because of the significant adverse effects but he has initial response to the treatment except the last scans that showed slight increase in right supraclavicular lymph node. The patient was admitted to the hospital with failure to thrive in addition to shortness of breath and weakness. He had repeat CT scan of the chest during this admission that showed evidence for disease progression as well as suspicious empyema involving the right pleural space.  He is status post chest tube placement for drainage of the empyema and feeling a little bit better.  The culture of the pleural fluid showed Streptococcus intermedius.  The patient is currently on treatment with aggressive antibiotic with Rocephin intravenously. Is improving slowly. I had a lengthy discussion with him today about his current condition and treatment options.  He understands that he has incurable condition and all the treatment will be of palliative nature. I discussed with the patient his treatment options including palliative care and hospice referral with a median survival of around 3 months versus consideration of additional systemic chemotherapy after resolution of the infection including treatment with single agent gemcitabine or Navelbine with probably a median survival of around 6-9 months but also has to deal with the adverse effect of the chemotherapy. The patient would like to take some time and  discussed with his wife before making a decision.  I will also arrange for him to have a follow-up visit with me in the clinic after discharge to go over his options in more details after recovery from the infection. Thank you so much for taking good care of Mr. Werth.  I will continue to follow-up the patient with you and assist in his management on as-needed basis.  LOS: 12 days    Eilleen Kempf 08/11/2021

## 2021-08-11 NOTE — Progress Notes (Signed)
Daily Progress Note   Patient Name: William Farrell       Date: 08/11/2021 DOB: 09-26-1961  Age: 60 y.o. MRN#: [id number] Attending Physician: Shelly Coss, MD Primary Care Physician: [name], DO Admit Date: 07/30/2021  Reason for Consultation/Follow-up: Establishing goals of care  Patient Profile/HPI:  60 y.o. male  with past medical history of stage IIIa non-small cell lung cancer currently on chemotherapy, hyperlipidemia, diabetes type 2 admitted on 07/30/2021 with cc of fatigue, poor oral intake for 2 weeks prior to admission. On workup found to have AFib RVR, DKA, and loculated right pleural effusion as well as progression of lung cancer. He is s/p chest tube placement, on antibiotics. AFib resolved to NSR after cardizem gtt, AKI at baseline, WBC trending down from loculated effusion/infection (45 -> 19 today). Anemia and malnutrition likely secondary to malignancy, dietician has seen/made recommendations. He is deconditioned and PT/OT recommending HHC PT/OT.  Admission and recovery have been complicated by ongoing output from chest tube- he has an empyema and likely trapped lung.   Subjective: Patient sitting up in bed, in pain. Spouse and nursing at bedside. Noted discussion with Dr. Julien Nordmann this morning regarding prognosis and options. They are hoping for placement of TPN today.  Spouse has a copy of [employer] Cho[employer] book and is reviewing it.   Review of Systems  Constitutional:  Positive for malaise/fatigue.  Respiratory:  Positive for shortness of breath.   Cardiovascular:  Positive for chest pain.    Physical Exam Vitals and nursing note reviewed.  Constitutional:      Appearance: He is ill-appearing.  Cardiovascular:     Rate and Rhythm: Normal rate.  Pulmonary:      Effort: Pulmonary effort is normal.  Skin:    General: Skin is warm and dry.     Coloration: Skin is pale.  Neurological:     Motor: Weakness present.            Vital Signs: BP 109/64 (BP Location: Right Arm)   Pulse 97   Temp 97.8 F (36.6 C) (Oral)   Resp 15   Ht 6' (1.829 m)   Wt 91.9 kg   SpO2 (!) 89%   BMI 27.48 kg/m  SpO2: SpO2: (!) 89 % O2 Device: O2 Device: Room Air (has a chest tube) O2 Flow Rate: O2  Flow Rate (L/min): 4 L/min (decreased to 3 lpm post neb)  Intake/output summary:  Intake/Output Summary (Last 24 hours) at 08/11/2021 1028 Last data filed at 08/11/2021 1017 Gross per 24 hour  Intake 228.83 ml  Output 360 ml  Net -131.17 ml   LBM: Last BM Date: 08/07/21 Baseline Weight: Weight: 95.4 kg Most recent weight: Weight: 91.9 kg       Palliative Assessment/Data: PPS: 30%      Patient Active Problem List   Diagnosis Date Noted  . Empyema of right pleural space ([place]) 08/04/2021  . Malnutrition of moderate degree 07/31/2021  . Atrial fibrillation with rapid ventricular response ([place]) 07/30/2021  . Pleural effusion, right   . Radiation-induced esophageal stricture   . Cough 04/08/2021  . Dysphagia   . Gastroesophageal reflux disease with esophagitis without hemorrhage   . Peptic stricture of esophagus   . Hiatal hernia   . Gastritis and gastroduodenitis   . Insomnia 01/01/2021  . Port-A-Cath in place 12/04/2020  . Encounter for antineoplastic immunotherapy 06/04/2020  . Malignant neoplasm of right upper lobe of lung ([place]) 03/13/2020  . Encounter for antineoplastic chemotherapy 03/13/2020  . Goals of care, counseling/discussion 03/13/2020  . Lung mass 03/11/2020  . Gastroesophageal reflux disease without esophagitis 02/15/2018  . Type 2 diabetes mellitus without complication, without long-term current use of insulin ([place]) 07/07/2017  . Hyperlipidemia 07/07/2017    Palliative Care Assessment & Plan     Assessment/Recommendations/Plan  Continue current plan of care PMT will revisit again tomorrow and further discuss [place]- patient and family processing their discussion with Dr. Julien Nordmann today   Code Status: Full code  Prognosis:  Unable to determine  Discharge Planning: To Be Determined  Care plan was discussed with patient's spouse.   Thank you for allowing the Palliative Medicine Team to assist in the care of this patient.  Total time: 26 mins Prolonged billing:No      Greater than 50%  of this time was spent counseling and coordinating care related to the above assessment and plan.  Mariana Kaufman, AGNP-C Palliative Medicine   Please contact Palliative Medicine Team phone at [phone] for questions and concerns.

## 2021-08-12 ENCOUNTER — Encounter (HOSPITAL_COMMUNITY): Payer: Self-pay | Admitting: Internal Medicine

## 2021-08-12 ENCOUNTER — Other Ambulatory Visit: Payer: BC Managed Care – PPO

## 2021-08-12 ENCOUNTER — Inpatient Hospital Stay (HOSPITAL_COMMUNITY): Payer: BC Managed Care – PPO

## 2021-08-12 DIAGNOSIS — R0602 Shortness of breath: Secondary | ICD-10-CM

## 2021-08-12 DIAGNOSIS — J154 Pneumonia due to other streptococci: Secondary | ICD-10-CM

## 2021-08-12 DIAGNOSIS — I4891 Unspecified atrial fibrillation: Secondary | ICD-10-CM | POA: Diagnosis not present

## 2021-08-12 DIAGNOSIS — J869 Pyothorax without fistula: Secondary | ICD-10-CM | POA: Diagnosis not present

## 2021-08-12 DIAGNOSIS — Z9689 Presence of other specified functional implants: Secondary | ICD-10-CM | POA: Diagnosis not present

## 2021-08-12 DIAGNOSIS — J9819 Other pulmonary collapse: Secondary | ICD-10-CM | POA: Diagnosis not present

## 2021-08-12 DIAGNOSIS — Z7189 Other specified counseling: Secondary | ICD-10-CM | POA: Diagnosis not present

## 2021-08-12 DIAGNOSIS — C3411 Malignant neoplasm of upper lobe, right bronchus or lung: Secondary | ICD-10-CM | POA: Diagnosis not present

## 2021-08-12 DIAGNOSIS — Z515 Encounter for palliative care: Secondary | ICD-10-CM

## 2021-08-12 LAB — CBC WITH DIFFERENTIAL/PLATELET
Abs Immature Granulocytes: 0.19 10*3/uL — ABNORMAL HIGH (ref 0.00–0.07)
Basophils Absolute: 0.1 10*3/uL (ref 0.0–0.1)
Basophils Relative: 0 %
Eosinophils Absolute: 0.1 10*3/uL (ref 0.0–0.5)
Eosinophils Relative: 0 %
HCT: 27.6 % — ABNORMAL LOW (ref 39.0–52.0)
Hemoglobin: 8.2 g/dL — ABNORMAL LOW (ref 13.0–17.0)
Immature Granulocytes: 1 %
Lymphocytes Relative: 3 %
Lymphs Abs: 0.8 10*3/uL (ref 0.7–4.0)
MCH: 28 pg (ref 26.0–34.0)
MCHC: 29.7 g/dL — ABNORMAL LOW (ref 30.0–36.0)
MCV: 94.2 fL (ref 80.0–100.0)
Monocytes Absolute: 2.3 10*3/uL — ABNORMAL HIGH (ref 0.1–1.0)
Monocytes Relative: 9 %
Neutro Abs: 22.3 10*3/uL — ABNORMAL HIGH (ref 1.7–7.7)
Neutrophils Relative %: 87 %
Platelets: 353 10*3/uL (ref 150–400)
RBC: 2.93 MIL/uL — ABNORMAL LOW (ref 4.22–5.81)
RDW: 19.7 % — ABNORMAL HIGH (ref 11.5–15.5)
WBC: 25.7 10*3/uL — ABNORMAL HIGH (ref 4.0–10.5)
nRBC: 0 % (ref 0.0–0.2)

## 2021-08-12 LAB — GLUCOSE, CAPILLARY
Glucose-Capillary: 147 mg/dL — ABNORMAL HIGH (ref 70–99)
Glucose-Capillary: 152 mg/dL — ABNORMAL HIGH (ref 70–99)
Glucose-Capillary: 142 mg/dL — ABNORMAL HIGH (ref 70–99)
Glucose-Capillary: 155 mg/dL — ABNORMAL HIGH (ref 70–99)

## 2021-08-12 MED ORDER — STERILE WATER FOR INJECTION IJ SOLN
5.0000 mg | Freq: Once | RESPIRATORY_TRACT | Status: AC
  Administered 2021-08-12: 5 mg via INTRAPLEURAL
  Filled 2021-08-12: qty 5

## 2021-08-12 MED ORDER — SODIUM CHLORIDE (PF) 0.9 % IJ SOLN
10.0000 mg | Freq: Once | INTRAMUSCULAR | Status: AC
  Administered 2021-08-12: 10 mg via INTRAPLEURAL
  Filled 2021-08-12: qty 10

## 2021-08-12 MED ORDER — HYDROMORPHONE HCL 1 MG/ML IJ SOLN
1.0000 mg | INTRAMUSCULAR | Status: DC | PRN
  Administered 2021-08-12 – 2021-08-18 (×21): 1 mg via INTRAVENOUS
  Filled 2021-08-12 (×23): qty 1

## 2021-08-12 NOTE — Progress Notes (Signed)
PROGRESS NOTE  William Farrell XLK:[id number] DOB: 25-Oct-1961 DOA: 07/30/2021 PCP: [name], DO   LOS: 13 days   Brief Narrative / Interim history: Patient is a 60 year old male with history of stage IIIa non-small cell lung cancer currently on chemotherapy, hyperlipidemia, diabetes type 2 who presented here with complaints of fatigue, poor oral intake for 2 weeks before admission.  On presentation he was found to be in A. fib with RVR, DKA and also found to have loculated right-sided pleural effusion.  PCCM was consulted on presentation,now status post right chest tube placement.  Hospital course was prolonged due to significant purulent drainage from the chest tube  PCCM, oncology, palliative care following  Subjective / 24h Interval events: Feeling little bit rough this morning, has been coughing up but usually does every morning.  Complains of soreness at the chest tube site  Assessment & Plan: Principal Problem Sepsis secondary to loculated right-sided pleural effusion-Presented with shortness of breath.  Status post chest tube placement on 9/15.  Procalcitonin was elevated.   He was on broad-spectrum antibiotics with vancomycin, cefepime, Flagyl.  Pleural fluid culture showing streptococcus intermedius, pansensitive.  Blood cultures are negative . Antibiotic has been changed to ceftriaxone which she is on currently.  Sputum culture showed few gram-negative coccobacilli. -Pulmonary consulted and following for his chest tube.  CT scan of the chest without contrast pending today. -WBC still elevated, malignancy can also contribute  Active Problems Stage III non-small cell lung cancer -Follows with Dr. Julien Nordmann, he was being treated with chemo.   Oncology has been following. -Pleural fluid cytology negative for malignancy. -Dr. Julien Nordmann talked to the patient and gave option on continuing chemotherapy versus hospice.  Family will get back after discussing among themselves.   Palliative care following   A. fib with RVR  -2 episodes.Transient, now resolved after starting on Cardizem drip.  Currently in normal sinus rhythm.  TSH normal.  Echocardiogram showed EF of 60 to 65%, normal left ventricular diastolic parameters.  Not a started on anticoagulation because of transient nature of the A. fib with RVR/likely related to pulmonary issues. CHA2DS2VASc score of 1.  Currently on aspirin.  Patient's BP remains soft,no room to start on rate control meds.If BP remains good,we can consider starting on low dose diltiazem.   DKA/diabetes type 2  -Initially started on insulin drip now transitioned to subcutaneous insulin.  Takes metformin, glipizide, Ozempic at home.  Monitor blood sugars.  Continue current insulin regimen.  Hemoglobin A1c of 6.6.  Sugars were rrunning low so Lantus discontinued.  CBG (last 3)  Recent Labs    08/11/21 1706 08/11/21 2006 08/12/21 1158  GLUCAP 132* 137* 152*     AKI on CKD stage IIIa -Creatinine was in the range of 2 on presentation.  Baseline creatinine around 1.3.  His creatinine is currently at baseline   Chronic normocytic anemia -Baseline hemoglobin around 8.   No evidence of acute blood loss.  Most likely associated with underlying malignancy, chemotherapy.  Iron studies showed low iron,given IV iron infusion.  Transfused with a unit of PRBC on 08/02/2021.   Hyperlipidemia-On statin   Hypothyroidism-On Synthyroid   Moderate malnutrition-Secondary to malignancy.  Nutrition following   Debility/deconditioning-PT/OT evaluated the patient and recommend home health on discharge.  Scheduled Meds:  (feeding supplement) PROSource Plus  30 mL Oral BID BM   aspirin EC  81 mg Oral QHS   atorvastatin  40 mg Oral QHS   Chlorhexidine Gluconate Cloth  6 each Topical Daily  chlorpheniramine-HYDROcodone  5 mL Oral Q12H   enoxaparin (LOVENOX) injection  40 mg Subcutaneous Q24H   feeding supplement  1 Container Oral TID BM   ferrous sulfate   325 mg Oral Q breakfast   fluticasone  2 spray Each Nare Daily   guaiFENesin  1,200 mg Oral BID   insulin aspart  0-15 Units Subcutaneous TID WC   insulin aspart  0-5 Units Subcutaneous QHS   levothyroxine  75 mcg Oral Q0600   lidocaine  1 patch Transdermal Daily   mouth rinse  15 mL Mouth Rinse BID   multivitamin with minerals  1 tablet Oral Daily   pantoprazole  40 mg Oral Daily   revefenacin  175 mcg Nebulization Daily   senna-docusate  2 tablet Oral QHS   sodium chloride  2 spray Each Nare BID   sodium chloride flush  10 mL Other Q8H   sodium chloride flush  10-40 mL Intracatheter Q12H   Continuous Infusions:  cefTRIAXone (ROCEPHIN)  IV Stopped (08/11/21 2036)   PRN Meds:.acetaminophen **OR** acetaminophen, dextrose, hydrALAZINE, HYDROmorphone (DILAUDID) injection, ipratropium-albuterol, loratadine, methocarbamol, metoprolol tartrate, ondansetron **OR** ondansetron (ZOFRAN) IV, oxyCODONE-acetaminophen, phenol, sodium chloride flush, traZODone  Diet Orders (From admission, onward)     Start     Ordered   07/31/21 0733  Diet Carb Modified Fluid consistency: Thin; Room service appropriate? Yes  Diet effective now       Question Answer Comment  Diet-HS Snack? Nothing   Calorie Level Medium 1600-2000   Fluid consistency: Thin   Room service appropriate? Yes      07/31/21 0732            DVT prophylaxis: enoxaparin (LOVENOX) injection 40 mg Start: 07/31/21 1000     Code Status: Full Code  Family Communication: Wife present at bedside  Status is: Inpatient  Remains inpatient appropriate because:Inpatient level of care appropriate due to severity of illness  Dispo: The patient is from: Home              Anticipated d/c is to: Home              Patient currently is not medically stable to d/c.   Difficult to place patient No  Level of care: Progressive  Consultants:  Pulmonary   Procedures:  Chest tube placement 9/15   Objective: Vitals:   08/11/21 2008  08/12/21 0357 08/12/21 0832 08/12/21 0834  BP: 105/71 104/82    Pulse: 94 (!) 103    Resp:      Temp: 98.2 F (36.8 C) 98.9 F (37.2 C)    TempSrc: Oral     SpO2: 94% 93% 94% 94%  Weight:      Height:        Intake/Output Summary (Last 24 hours) at 08/12/2021 1303 Last data filed at 08/12/2021 0556 Gross per 24 hour  Intake 777 ml  Output 195 ml  Net 582 ml   Filed Weights   07/30/21 1101 07/30/21 1133 08/06/21 1255  Weight: 95.4 kg 88.9 kg 91.9 kg    Examination:  Constitutional: NAD Eyes: no scleral icterus ENMT: Mucous membranes are moist.  Neck: normal, supple Respiratory: clear to auscultation bilaterally, no wheezing, no crackles.  Scattered rhonchi that clear with coughing.  Chest tube in place Cardiovascular: Regular rate and rhythm, no murmurs / rubs / gallops. No LE edema. Abdomen: non distended, no tenderness. Bowel sounds positive.  Musculoskeletal: no clubbing / cyanosis.  Skin: no rashes Neurologic: CN 2-12 grossly intact. Strength  5/5 in all 4.  Psychiatric: Normal judgment and insight. Alert and oriented x 3. Normal mood.    Data Reviewed: I have independently reviewed following labs and imaging studies   CBC: Recent Labs  Lab 08/06/21 0424 08/07/21 0347 08/09/21 0420 08/11/21 0311 08/12/21 0419  WBC 23.1* 24.2* 25.1* 28.3* 25.7*  NEUTROABS 20.1* 20.9* 22.2* 24.7* 22.3*  HGB 9.0* 9.0* 8.8* 8.5* 8.2*  HCT 29.6* 29.7* 29.5* 29.0* 27.6*  MCV 94.0 94.3 93.1 94.8 94.2  PLT 349 347 362 382 898   Basic Metabolic Panel: Recent Labs  Lab 08/06/21 0424 08/07/21 0347 08/09/21 0420  NA  --  136 143  K  --  4.0 4.3  CL  --  104 109  CO2  --  22 23  GLUCOSE  --  81 94  BUN  --  9 9  CREATININE 0.76 0.66 0.77  CALCIUM  --  8.7* 9.2   Liver Function Tests: No results for input(s): AST, ALT, ALKPHOS, BILITOT, PROT, ALBUMIN in the last 168 hours. Coagulation Profile: No results for input(s): INR, PROTIME in the last 168 hours. HbA1C: No results  for input(s): HGBA1C in the last 72 hours. CBG: Recent Labs  Lab 08/11/21 0740 08/11/21 1114 08/11/21 1706 08/11/21 2006 08/12/21 1158  GLUCAP 165* 145* 132* 137* 152*    No results found for this or any previous visit (from the past 240 hour(s)).   Radiology Studies: No results found.  Marzetta Board, MD, PhD [facility]  Between 7 am - 7 pm I am available, please contact me via Amion (for emergencies) or Securechat (non urgent messages)  Between 7 pm - 7 am I am not available, please contact night coverage MD/APP via Amion

## 2021-08-12 NOTE — Progress Notes (Signed)
Daily Progress Note   Patient Name: William Farrell       Date: 08/12/2021 DOB: 05-22-1961  Age: 60 y.o. MRN#: [id number] Attending Physician: William Griffins, MD Primary Care Physician: William Reeve, DO Admit Date: 07/30/2021  Reason for Consultation/Follow-up: Establishing goals of care  Patient Profile/HPI:  60 y.o. male  with past medical history of stage IIIa non-small cell lung cancer currently on chemotherapy, hyperlipidemia, diabetes type 2 admitted on 07/30/2021 with cc of fatigue, poor oral intake for 2 weeks prior to admission. On workup found to have AFib RVR, DKA, and loculated right pleural effusion as well as progression of lung cancer. He is s/p chest tube placement, on antibiotics. AFib resolved to NSR after cardizem gtt, AKI at baseline, WBC trending down from loculated effusion/infection (45 -> 19 today). Anemia and malnutrition likely secondary to malignancy, dietician has seen/made recommendations. He is deconditioned and PT/OT recommending HHC PT/OT.  Admission and recovery have been complicated by ongoing output from chest tube- he has an empyema and likely trapped lung.   Subjective: Patient sitting up in bed, in pain. Spouse and at bedside.   Patient continues to complain of cough, generalized chest discomfort, back discomfort as well as shortness of breath.  He states that IV Dilaudid is most effective for management of his symptoms.  Review of Systems  Constitutional:  Positive for malaise/fatigue.  Respiratory:  Positive for shortness of breath.   Cardiovascular:  Positive for chest pain.    Physical Exam Vitals and nursing note reviewed.  Constitutional:      Appearance: He is ill-appearing.  Cardiovascular:     Rate and Rhythm: Normal rate.   Pulmonary:     Effort: Pulmonary effort is normal.  Skin:    General: Skin is warm and dry.     Coloration: Skin is pale.  Neurological:     Motor: Weakness present.            Vital Signs: BP 112/80 (BP Location: Right Arm)   Pulse 99   Temp 98.1 F (36.7 C) (Oral)   Resp 18   Ht 6' (1.829 m)   Wt 91.9 kg   SpO2 92%   BMI 27.48 kg/m  SpO2: SpO2: 92 % O2 Device: O2 Device: Nasal Cannula O2 Flow Rate: O2 Flow Rate (L/min): 4 L/min  Intake/output summary:  Intake/Output Summary (Last 24 hours) at 08/12/2021 1403 Last data filed at 08/12/2021 1327 Gross per 24 hour  Intake 1267 ml  Output 195 ml  Net 1072 ml    LBM: Last BM Date: 08/08/21 Baseline Weight: Weight: 95.4 kg Most recent weight: Weight: 91.9 kg       Palliative Assessment/Data: PPS: 30%      Patient Active Problem List   Diagnosis Date Noted  . Empyema of right pleural space ([place]) 08/04/2021  . Malnutrition of moderate degree 07/31/2021  . Atrial fibrillation with rapid ventricular response ([place]) 07/30/2021  . Pleural effusion, right   . Radiation-induced esophageal stricture   . Cough 04/08/2021  . Dysphagia   . Gastroesophageal reflux disease with esophagitis without hemorrhage   . Peptic stricture of esophagus   . Hiatal hernia   . Gastritis and gastroduodenitis   . Insomnia 01/01/2021  . Port-A-Cath in place 12/04/2020  . Encounter for antineoplastic immunotherapy 06/04/2020  . Malignant neoplasm of right upper lobe of lung ([place]) 03/13/2020  . Encounter for antineoplastic chemotherapy 03/13/2020  . Goals of care, counseling/discussion 03/13/2020  . Lung mass 03/11/2020  . Gastroesophageal reflux disease without esophagitis 02/15/2018  . Type 2 diabetes mellitus without complication, without long-term current use of insulin ([place]) 07/07/2017  . Hyperlipidemia 07/07/2017    Palliative Care Assessment & Plan    Assessment/Recommendations/Plan  Chart reviewed, patient seen and examined,  discussed with patient and wife Ms. William Farrell who was present at the bedside.  Reviewed with him about patient's current condition as well as underlying illnesses.  Patient lives at home with his wife in [place], [place].  They do not have children.  Patient states that his symptom burden is only escalating.  He wants to know what treatment options exist and when they can be carried out.  He does want to go home towards the end of this hospitalization.  Today, we very gently compared and contrasted full code full scope care versus more of a comfort-focused/hospice approach.  Patient would like to rediscuss with all members of his care team about any available meaningful interventions.  Provided assistance and engaged in broad goals of care conversations to the best of my ability.   Code Status: Full code Full code for now, we discussed about DNR/DNI, patient and wife to rediscuss. Prognosis:  Unable to determine Appears guarded. Discharge Planning: To Be Determined  Care plan was discussed with patient and spouse.   Thank you for allowing the Palliative Medicine Team to assist in the care of this patient.  Total time: 35 mins Prolonged billing:No      Greater than 50%  of this time was spent counseling and coordinating care related to the above assessment and plan.  Loistine Chance, MD  palliative Medicine   Please contact Palliative Medicine Team phone at [phone] for questions and concerns from 7 AM to 7 PM, after 7 PM, please contact primary service.

## 2021-08-12 NOTE — Progress Notes (Signed)
NAME:  William Farrell, MRN:  [id number], DOB:  25-Sep-1961, LOS: 106 ADMISSION DATE:  07/30/2021, CONSULTATION DATE:  9/15 REFERRING MD:  Marylyn Ishihara, CHIEF COMPLAINT:  Dyspnea, weakness   History of Present Illness:  60 y/o male with known RUL lung cancer who was admitted in the setting of dyspnea and a pleural effusion.  He was found to have an empyema in the right lung when a pigtail catheter was placed.   Pertinent  Medical History  NSCLC stage IIIA (dx 02/2020)> s/p XRT, keytruda Chemotherapy induced leukopenia> on neulasta GERD DM2 Diverticulitis Kidney stones Hyperlipidemia Asthma  Significant Hospital Events: Including procedures, antibiotic start and stop dates in addition to other pertinent events   Chest tube placement 07/30/2021 > Cytology pleural fluid >> no malignant cells  Imaging: 9/18 CT chest > pigtail chest tube in place, extensive, diffuse pleural thickening and adjacent interlobular septal thickening, small loculated hydropneumothorax approximately 10% volume.  New trace pleural effusion, similar post treatment appearance of lung mass compared to prior, multiple small pulmonary nodules  Micro Pleural fluid cx > pan-sensitive Strep intermedius  Abx: Cefepime 9/15 -18 Vanc 9/15 -16 Flagyl 9/15-18 Rocephin 9/18 >>>   Interim History / Subjective:  Still having pain from chest tube. No new complaints.  Objective   Blood pressure 112/80, pulse 99, temperature 98.1 F (36.7 C), temperature source Oral, resp. rate 18, height 6' (1.829 m), weight 95.7 kg, SpO2 92 %.        Intake/Output Summary (Last 24 hours) at 08/12/2021 1545 Last data filed at 08/12/2021 1327 Gross per 24 hour  Intake 1267 ml  Output 195 ml  Net 1072 ml    Filed Weights   07/30/21 1133 08/06/21 1255 08/12/21 1524  Weight: 88.9 kg 91.9 kg 95.7 kg    Examination:  General: Chronically ill appearing man lying in bed in NAD HENT: [place]/AT, eyes anicteric PULM: reduced R breath sounds,  purulent drainage from chest tube CV: S1S2, RRR.  GI: soft, NT MSK: no peripheral edema, no cyanosis Neuro: sleepy but arouses with stimulation, moving extremities  CT chest personally reviewed>still has intraparenchymal opacities with air bronchograms and now increasing volume of pleural fluid. Still has trapped lung in RUL.   Resolved Hospital Problem list     Assessment & Plan:  Right empyema in setting of R lung NSCLC with associated hydropneumothorax due to trapped lung. Unresolved empyema despite antibiotics and chest tube drainage. -reculture pleural fluid-- sent today before lytics -start TPA and DNAse x 3 days -chest tube to suction, flush per pigtail protocol -con't ceftriaxone for now; may need to change regimen based on culture results -con't pain control with oxycodone + dilaudid  Prognosis remains poor. Wife updated at bedside.  Best Practice (right click and "Reselect all SmartList Selections" daily)   Per [facility]  Labs   CBC: Recent Labs  Lab 08/06/21 0424 08/07/21 0347 08/09/21 0420 08/11/21 0311 08/12/21 0419  WBC 23.1* 24.2* 25.1* 28.3* 25.7*  NEUTROABS 20.1* 20.9* 22.2* 24.7* 22.3*  HGB 9.0* 9.0* 8.8* 8.5* 8.2*  HCT 29.6* 29.7* 29.5* 29.0* 27.6*  MCV 94.0 94.3 93.1 94.8 94.2  PLT 349 347 362 382 353     Basic Metabolic Panel: Recent Labs  Lab 08/06/21 0424 08/07/21 0347 08/09/21 0420  NA  --  136 143  K  --  4.0 4.3  CL  --  104 109  CO2  --  22 23  GLUCOSE  --  81 94  BUN  --  9  9  CREATININE 0.76 0.66 0.77  CALCIUM  --  8.7* 9.2    GFR: Estimated Creatinine Clearance: 117.8 mL/min (by C-G formula based on SCr of 0.77 mg/dL). Recent Labs  Lab 08/07/21 0347 08/09/21 0420 08/11/21 0311 08/12/21 0419  WBC 24.2* 25.1* 28.3* 25.7*       Julian Hy, DO 08/12/21 7:12 PM [facility]

## 2021-08-12 NOTE — Procedures (Signed)
Pleural Fibrinolytic Administration Procedure Note  William Farrell  [id number]  07-31-1961  Date:08/12/21  Time:5:19 PM   Provider Performing:Brooke Moshe Cipro   Procedure: Pleural Fibrinolysis Initial day [phone])  Indication(s) Fibrinolysis of complicated pleural effusion  Consent Risks of the procedure as well as the alternatives and risks of each were explained to the patient and/or caregiver.  Consent for the procedure was obtained.   Anesthesia None   Time Out Verified patient identification, verified procedure, site/side was marked, verified correct patient position, special equipment/implants available, medications/allergies/relevant history reviewed, required imaging and test results available.   Sterile Technique Hand hygiene, gloves   Procedure Description Existing right pleural catheter was cleaned and accessed in sterile manner.  10mg  of tPA in 30cc of saline and 5mg  of dornase in 30cc of sterile water were injected into pleural space using existing pleural catheter.  Catheter will be clamped for 1 hour and then placed back to suction.  Patient instructed to frequently turn over the next hour with recommendations to lay supine, however due to pain, he can not tolerate laying lower than <30 degrees despite pain medication- this is chronic per wife.    Complications/Tolerance None; patient tolerated the procedure well.   EBL None   Specimen(s) None      [name], ACNP [facility] 08/12/2021, 5:21 PM  See Amion for pager If no response to pager, please call PCCM consult pager After 7:00 pm call Elink

## 2021-08-12 NOTE — TOC Progression Note (Addendum)
Transition of Care [facility]) - Progression Note    Patient Details  Name: William Farrell MRN: [id number] Date of Birth: 1961-10-30  Transition of Care [facility]) CM/SW Contact  Ross Ludwig, [place] Phone Number: 08/12/2021, 5:35 PM  Clinical Narrative:     CSW spoke to patient's wife to discuss DME request for hospital bed and front wheel walker.  CSW informed her that there may be a copay, but the DME company can let her know.  CSW spoke to [facility] DME and they will be able to provide equipment for patient.  Per Brenton Grills at [facility] he will coordinate with patient's family about hospital bed delivery.  CSW asked patient's wife if they would be interested in home health once he is ready for discharge.  CSW explained because of the insurance it may be difficult to find a [facility] agency that can accept him.  Wife expressed understanding, she said she was not sure if they want home health yet.  Per wife she wants to see how he does over the next few days.  CSW to continue to follow patient's progress throughout discharge planning.   Expected Discharge Plan: [place] Barriers to Discharge: Continued Medical Work up  Expected Discharge Plan and Services Expected Discharge Plan: [place]   Discharge Planning Services: CM Consult   Living arrangements for the past 2 months: Single Family Home                                       Social Determinants of Health (SDOH) Interventions    Readmission Risk Interventions No flowsheet data found.

## 2021-08-12 NOTE — Progress Notes (Signed)
Nutrition Follow-up  DOCUMENTATION CODES:   Non-severe (moderate) malnutrition in context of chronic illness  INTERVENTION:  - continue Boost Breeze TID and 30 ml Prosource Plus BID.  - monitor for ability for MBS to be completed.  - weigh patient today.  - will monitor for decisions concerning GOC. - if patient remains Full Code and PO intakes remain poor, will need to have a conversation about feeding tube placement.    NUTRITION DIAGNOSIS:   Moderate Malnutrition related to chronic illness, cancer and cancer related treatments as evidenced by percent weight loss, moderate fat depletion, moderate muscle depletion, energy intake < or equal to 75% for > or equal to 1 month. -ongoing  GOAL:   Patient will meet greater than or equal to 90% of their needs -unmet  MONITOR:   PO intake, Supplement acceptance, Labs, Weight trends, I & O's  ASSESSMENT:   60 year old male with prior history of Stage IIIa NSCLC ([facility]) RUL diagnosed 02/2020 (RUL/suprahilar mass and R paratracheal and subcarinal LAD) s/p chemo XRT including keytruda, consolidation IT with durvalumab, SBRT to RUL nodules followed by Dr. Julien Nordmann. He is receiving neulasta support.     Presented to Dr. Julien Nordmann for follow-up, reportedly not felt well for 1-2 weeks, 15 lb weight loss since his prior visit, pain under right breast, worse with cough and progressive dyspnea on exertion even on his home O2.  He has had productive cough, orthopnea that has worsened over the last week.  Nurse caring for patient at the time of first attempted visit and patient out of the room to CT at time of second attempted visit.   He has been accepting Prosource Plus 100% of the time offered and Boost Breeze 90% of the time offered.   The only documented meal intake since RD assessment on 9/22 was 15% of breakfast this AM.   He has not been weighed since 9/22. Mild pitting edema to RUE and moderate pitting edema to LUE and BLE documented in the edema  section of flow sheet. He is noted to be -798 ml since admission.    SLP note from yesterday indicates patient refusing to do MBS until after chest tube has ben removed.   Palliative Care is following and states PPS of 30%. Patient remains Full Code at this time.    Labs reviewed; CBG: 152 mg/dl, no BMP since 9/25. Medications reviewed; 325 mg ferrous sulafte/day, sliding scale novolog, 75 mcg oral synthroid/day, 1 tablet multivitamin with minerals/day, 40 mg oral protonix/day, 2 tablets senokot/day.   Diet Order:   Diet Order             Diet Carb Modified Fluid consistency: Thin; Room service appropriate? Yes  Diet effective now                   EDUCATION NEEDS:   Education needs have been addressed  Skin:  Skin Assessment: Reviewed RN Assessment  Last BM:  9/23 (type 3)  Height:   Ht Readings from Last 1 Encounters:  07/30/21 6' (1.829 m)    Weight:   Wt Readings from Last 1 Encounters:  08/06/21 91.9 kg     Estimated Nutritional Needs:  Kcal:  2400-2600 Protein:  125-135g Fluid:  >2L/day      Jarome Matin, MS, RD, LDN, CNSC Inpatient Clinical Dietitian RD pager # available in AMION  After hours/weekend pager # available in [facility]

## 2021-08-13 DIAGNOSIS — Z515 Encounter for palliative care: Secondary | ICD-10-CM | POA: Diagnosis not present

## 2021-08-13 DIAGNOSIS — Z4682 Encounter for fitting and adjustment of non-vascular catheter: Secondary | ICD-10-CM | POA: Diagnosis not present

## 2021-08-13 DIAGNOSIS — Y95 Nosocomial condition: Secondary | ICD-10-CM

## 2021-08-13 DIAGNOSIS — E440 Moderate protein-calorie malnutrition: Secondary | ICD-10-CM | POA: Diagnosis not present

## 2021-08-13 DIAGNOSIS — J189 Pneumonia, unspecified organism: Secondary | ICD-10-CM | POA: Diagnosis not present

## 2021-08-13 DIAGNOSIS — C3411 Malignant neoplasm of upper lobe, right bronchus or lung: Secondary | ICD-10-CM | POA: Diagnosis not present

## 2021-08-13 DIAGNOSIS — J869 Pyothorax without fistula: Secondary | ICD-10-CM | POA: Diagnosis not present

## 2021-08-13 LAB — BASIC METABOLIC PANEL
Anion gap: 6 (ref 5–15)
BUN: 14 mg/dL (ref 6–20)
CO2: 24 mmol/L (ref 22–32)
Calcium: 8.9 mg/dL (ref 8.9–10.3)
Chloride: 107 mmol/L (ref 98–111)
Creatinine, Ser: 0.86 mg/dL (ref 0.61–1.24)
GFR, Estimated: >60 mL/min (ref >60)
Glucose, Bld: 157 mg/dL — ABNORMAL HIGH (ref 70–99)
Potassium: 4.6 mmol/L (ref 3.5–5.1)
Sodium: 137 mmol/L (ref 135–145)

## 2021-08-13 LAB — CBC
HCT: 26.6 % — ABNORMAL LOW (ref 39.0–52.0)
Hemoglobin: 7.9 g/dL — ABNORMAL LOW (ref 13.0–17.0)
MCH: 27.9 pg (ref 26.0–34.0)
MCHC: 29.7 g/dL — ABNORMAL LOW (ref 30.0–36.0)
MCV: 94 fL (ref 80.0–100.0)
Platelets: 356 10*3/uL (ref 150–400)
RBC: 2.83 MIL/uL — ABNORMAL LOW (ref 4.22–5.81)
RDW: 19.5 % — ABNORMAL HIGH (ref 11.5–15.5)
WBC: 22 10*3/uL — ABNORMAL HIGH (ref 4.0–10.5)
nRBC: 0 % (ref 0.0–0.2)

## 2021-08-13 LAB — GLUCOSE, CAPILLARY
Glucose-Capillary: 151 mg/dL — ABNORMAL HIGH (ref 70–99)
Glucose-Capillary: 154 mg/dL — ABNORMAL HIGH (ref 70–99)
Glucose-Capillary: 85 mg/dL (ref 70–99)
Glucose-Capillary: 123 mg/dL — ABNORMAL HIGH (ref 70–99)

## 2021-08-13 MED ORDER — SODIUM CHLORIDE (PF) 0.9 % IJ SOLN
10.0000 mg | Freq: Once | INTRAMUSCULAR | Status: AC
  Administered 2021-08-13: 10 mg via INTRAPLEURAL
  Filled 2021-08-13: qty 10

## 2021-08-13 MED ORDER — STERILE WATER FOR INJECTION IJ SOLN
5.0000 mg | Freq: Once | RESPIRATORY_TRACT | Status: AC
  Administered 2021-08-13: 5 mg via INTRAPLEURAL
  Filled 2021-08-13: qty 5

## 2021-08-13 MED ORDER — METOPROLOL TARTRATE 12.5 MG HALF TABLET
12.5000 mg | ORAL_TABLET | Freq: Two times a day (BID) | ORAL | Status: DC
  Administered 2021-08-13 – 2021-08-15 (×3): 12.5 mg via ORAL
  Filled 2021-08-13 (×6): qty 1

## 2021-08-13 MED ORDER — HYDROMORPHONE HCL 1 MG/ML IJ SOLN
0.5000 mg | Freq: Once | INTRAMUSCULAR | Status: DC
  Filled 2021-08-13: qty 1

## 2021-08-13 MED ORDER — PIPERACILLIN-TAZOBACTAM 3.375 G IVPB
3.3750 g | Freq: Three times a day (TID) | INTRAVENOUS | Status: DC
  Administered 2021-08-13 – 2021-08-17 (×12): 3.375 g via INTRAVENOUS
  Filled 2021-08-13 (×10): qty 50

## 2021-08-13 MED ORDER — HYDROMORPHONE HCL 1 MG/ML IJ SOLN
1.0000 mg | Freq: Once | INTRAMUSCULAR | Status: AC
  Administered 2021-08-13: 1 mg via INTRAVENOUS

## 2021-08-13 NOTE — Progress Notes (Signed)
NAME:  William Farrell, MRN:  [id number], DOB:  1961-07-17, LOS: 53 ADMISSION DATE:  07/30/2021, CONSULTATION DATE:  9/15 REFERRING MD:  Marylyn Ishihara, CHIEF COMPLAINT:  Dyspnea, weakness   History of Present Illness:  60 y/o male with known RUL lung cancer who was admitted in the setting of dyspnea and a pleural effusion.  He was found to have an empyema in the right lung when a pigtail catheter was placed.   Pertinent  Medical History  NSCLC stage IIIA (dx 02/2020)> s/p XRT, keytruda Chemotherapy induced leukopenia> on neulasta GERD DM2 Diverticulitis Kidney stones Hyperlipidemia Asthma  Significant Hospital Events: Including procedures, antibiotic start and stop dates in addition to other pertinent events   Chest tube placement 07/30/2021 > Cytology pleural fluid >> no malignant cells  Imaging: 9/18 CT chest > pigtail chest tube in place, extensive, diffuse pleural thickening and adjacent interlobular septal thickening, small loculated hydropneumothorax approximately 10% volume.  New trace pleural effusion, similar post treatment appearance of lung mass compared to prior, multiple small pulmonary nodules  Micro Pleural fluid cx > pan-sensitive Strep intermedius  Abx: Cefepime 9/15 -18 Vanc 9/15 -16 Flagyl 9/15-18 Rocephin 9/18 >>>   Interim History / Subjective:  Still having pain from chest tube. Ongoing sputum prodcution. No new complaints.  Objective   Blood pressure 121/75, pulse (!) 115, temperature 98 F (36.7 C), temperature source Oral, resp. rate 14, height 6' (1.829 m), weight 95.7 kg, SpO2 96 %.        Intake/Output Summary (Last 24 hours) at 08/13/2021 0816 Last data filed at 08/13/2021 0600 Gross per 24 hour  Intake 20 ml  Output 350 ml  Net -330 ml    Filed Weights   07/30/21 1133 08/06/21 1255 08/12/21 1524  Weight: 88.9 kg 91.9 kg 95.7 kg    Examination:  General: frail, chronically ill appearing man sitting up in bed watching TV in NAD HENT:  [place]/AT, eyes anicteric PULM: chest tube with ongoing thick white output. Rhales on the right, CTA on the left. Ongoing coughing with sputum production. CV:  S1S2, RRR GI: soft, minimally TTP in lower abdomen, no guarding MSK: minimal edema, no cyanosis Derm: port accessed- dressing intact, no rashes. +Pallor. Neuro: awake, moving extremities but globally weak Psych: flattened affect, cooperative with exam  Chest tube output 350cc  (1530cc in atrium currently) Pleural fluid culture 9/28> abundant GNRs, rare GPCs > 9/15 pleural fluid> Strep intermedius   Resolved Hospital Problem list     Assessment & Plan:  Right Strep empyema in setting of R lung NSCLC with associated hydropneumothorax due to trapped lung.  R multilobar pneumonia. Unresolved empyema despite antibiotics and chest tube drainage. Repeated cultures look like he has a secondary pathogen with GNRs. -con't to follow repeat cultures until finalized -escalating antibiotics to pip-tazo today -2nd dose of TPA & DNAse today, repeat CXR tomorrow morning -chest tube to suction, flush per pigtail protocol -con't pain control with oxycodone + dilaudid  Prognosis remains guarded. Wife not present at bedside today.  Best Practice (right click and "Reselect all SmartList Selections" daily)   Per [facility]  Labs   CBC: Recent Labs  Lab 08/07/21 0347 08/09/21 0420 08/11/21 0311 08/12/21 0419 08/13/21 0603  WBC 24.2* 25.1* 28.3* 25.7* 22.0*  NEUTROABS 20.9* 22.2* 24.7* 22.3*  --   HGB 9.0* 8.8* 8.5* 8.2* 7.9*  HCT 29.7* 29.5* 29.0* 27.6* 26.6*  MCV 94.3 93.1 94.8 94.2 94.0  PLT 347 362 382 353 356     Basic  Metabolic Panel: Recent Labs  Lab 08/07/21 0347 08/09/21 0420 08/13/21 0603  NA 136 143 137  K 4.0 4.3 4.6  CL 104 109 107  CO2 22 23 24   GLUCOSE 81 94 157*  BUN 9 9 14   CREATININE 0.66 0.77 0.86  CALCIUM 8.7* 9.2 8.9    GFR: Estimated Creatinine Clearance: 109.6 mL/min (by C-G formula based on SCr of 0.86  mg/dL). Recent Labs  Lab 08/09/21 0420 08/11/21 0311 08/12/21 0419 08/13/21 0603  WBC 25.1* 28.3* 25.7* 22.0*       Julian Hy, DO 08/13/21 10:44 AM [facility]

## 2021-08-13 NOTE — Progress Notes (Signed)
Pharmacy Antibiotic Note  William Farrell is a 60 y.o. male with lung cancer who is known to pharmacy from prior abx consults with this admission.  He's currently on ceftriaxone for strep intermedius noted in his right pleural fluid culture collected on 9/15.  Right pleural fluid culture collected on 9/28 now has abundant GNR and rare GPC in chains. Pharmacy has been consulted on 9/29 to change abx to zosyn.  Plan: - zosyn 3.375 gm IV q8h (infuse over 4 hrs) - with good renal function, pharmacy will sign off for zosyn. Re-consult [place] if need further assistance.  _________________________________________  Height: 6' (182.9 cm) Weight: 95.7 kg (211 lb) IBW/kg (Calculated) : 77.6  Temp (24hrs), Avg:97.9 F (36.6 C), Min:97.6 F (36.4 C), Max:98.1 F (36.7 C)  Recent Labs  Lab 08/07/21 0347 08/09/21 0420 08/11/21 0311 08/12/21 0419 08/13/21 0603  WBC 24.2* 25.1* 28.3* 25.7* 22.0*  CREATININE 0.66 0.77  --   --  0.86    Estimated Creatinine Clearance: 109.6 mL/min (by C-G formula based on SCr of 0.86 mg/dL).    Allergies  Allergen Reactions   Sitagliptin Other (See Comments)    headache    9/15 Cefepime >> 9/18 9/15 Vancomycin >>9/18 9/15 Flagyl >> 9/19 9/15 CTX>>9/29 9/29 zosyn>>   9/15 BCx: NGF 9/15 R pleural fluid: abundant Strep intermedius (pans sens) FINAL 9/15 MRSA PCR: neg 9/16 UCx: <10K - final 9/18 Sputum: NF-final 9/28 right pleural fluid: abundamt GNR, rare GPC in chains Thank you for allowing pharmacy to be a part of this patient's care.  Lynelle Doctor 08/13/2021 10:51 AM

## 2021-08-13 NOTE — Progress Notes (Signed)
Pt was 96% on RA. Pt wanted his O2 back on. Pt was on 4l and RT decreased O2 to 2l. Rt will continue to monitor

## 2021-08-13 NOTE — Progress Notes (Signed)
PROGRESS NOTE  William Farrell WUJ:[id number] DOB: May 21, 1961 DOA: 07/30/2021 PCP: [name], DO   LOS: 14 days   Brief Narrative / Interim history: Patient is a 60 year old male with history of stage IIIa non-small cell lung cancer currently on chemotherapy, hyperlipidemia, diabetes type 2 who presented here with complaints of fatigue, poor oral intake for 2 weeks before admission.  On presentation he was found to be in A. fib with RVR, DKA and also found to have loculated right-sided pleural effusion.  PCCM was consulted on presentation,now status post right chest tube placement.  Hospital course was prolonged due to significant purulent drainage from the chest tube  PCCM, oncology, palliative care following  Subjective / 24h Interval events: Very frustrated this morning.  He tells me he wants to go home as soon as possible, complains of ongoing shortness of breath  Assessment & Plan: Principal Problem Sepsis secondary to loculated right-sided pleural effusion, empyema-Presented with shortness of breath.  Status post chest tube placement on 9/15.  Procalcitonin was elevated.   He was on broad-spectrum antibiotics with vancomycin, cefepime, Flagyl.  Pleural fluid culture showing streptococcus intermedius, pansensitive.  He was cultured again 9/28 now showing GNR's in addition to prior microbiology, broadened to Zosyn.  Pulmonary consulted, appreciate input, getting tPA/DNase x3 days with last dose tomorrow  Active Problems Stage III non-small cell lung cancer -Follows with Dr. Julien Nordmann, he was being treated with chemo.   Oncology has been following. -Pleural fluid cytology negative for malignancy. -Dr. Julien Nordmann talked to the patient and gave option on continuing chemotherapy versus hospice.  Family will get back after discussing among themselves.  Palliative care following   A. fib with RVR  -2 episodes.Transient, now resolved after starting on Cardizem drip.  Currently in normal  sinus rhythm.  TSH normal.  Echocardiogram showed EF of 60 to 65%, normal left ventricular diastolic parameters.  Not a started on anticoagulation because of transient nature of the A. fib with RVR/likely related to pulmonary issues. CHA2DS2VASc score of 1.  Currently on aspirin.   -Blood pressure stable, start low-dose metoprolol   DKA/diabetes type 2  -Initially started on insulin drip now transitioned to subcutaneous insulin.  Takes metformin, glipizide, Ozempic at home.  Monitor blood sugars.  Continue current insulin regimen.  Hemoglobin A1c of 6.6.  Sugars were rrunning low so Lantus discontinued.  CBG (last 3)  Recent Labs    08/12/21 2005 08/13/21 0747 08/13/21 1143  GLUCAP 155* 151* 154*    AKI on CKD stage IIIa -Creatinine was in the range of 2 on presentation.  Baseline creatinine around 1.3.  His creatinine is currently at baseline   Chronic normocytic anemia -Baseline hemoglobin around 8.   No evidence of acute blood loss.  Most likely associated with underlying malignancy, chemotherapy.  Iron studies showed low iron,given IV iron infusion.  Transfused with a unit of PRBC on 08/02/2021. -Trending down, will continue to monitor   Hyperlipidemia-On statin   Hypothyroidism-On Synthyroid   Moderate malnutrition-Secondary to malignancy.  Nutrition following   Debility/deconditioning-PT/OT evaluated the patient and recommend home health on discharge.  Scheduled Meds:  (feeding supplement) PROSource Plus  30 mL Oral BID BM   aspirin EC  81 mg Oral QHS   atorvastatin  40 mg Oral QHS   Chlorhexidine Gluconate Cloth  6 each Topical Daily   chlorpheniramine-HYDROcodone  5 mL Oral Q12H   enoxaparin (LOVENOX) injection  40 mg Subcutaneous Q24H   feeding supplement  1 Container Oral TID BM  ferrous sulfate  325 mg Oral Q breakfast   fluticasone  2 spray Each Nare Daily   guaiFENesin  1,200 mg Oral BID   insulin aspart  0-15 Units Subcutaneous TID WC   insulin aspart  0-5  Units Subcutaneous QHS   levothyroxine  75 mcg Oral Q0600   lidocaine  1 patch Transdermal Daily   mouth rinse  15 mL Mouth Rinse BID   multivitamin with minerals  1 tablet Oral Daily   pantoprazole  40 mg Oral Daily   revefenacin  175 mcg Nebulization Daily   senna-docusate  2 tablet Oral QHS   sodium chloride  2 spray Each Nare BID   sodium chloride flush  10 mL Other Q8H   sodium chloride flush  10-40 mL Intracatheter Q12H   Continuous Infusions:  piperacillin-tazobactam (ZOSYN)  IV 3.375 g (08/13/21 1226)   PRN Meds:.acetaminophen **OR** acetaminophen, dextrose, hydrALAZINE, HYDROmorphone (DILAUDID) injection, ipratropium-albuterol, loratadine, methocarbamol, metoprolol tartrate, ondansetron **OR** ondansetron (ZOFRAN) IV, oxyCODONE-acetaminophen, phenol, sodium chloride flush, traZODone  Diet Orders (From admission, onward)     Start     Ordered   07/31/21 0733  Diet Carb Modified Fluid consistency: Thin; Room service appropriate? Yes  Diet effective now       Question Answer Comment  Diet-HS Snack? Nothing   Calorie Level Medium 1600-2000   Fluid consistency: Thin   Room service appropriate? Yes      07/31/21 0732            DVT prophylaxis: enoxaparin (LOVENOX) injection 40 mg Start: 07/31/21 1000     Code Status: Full Code  Family Communication: Wife present at bedside  Status is: Inpatient  Remains inpatient appropriate because:Inpatient level of care appropriate due to severity of illness  Dispo: The patient is from: Home              Anticipated d/c is to: Home              Patient currently is not medically stable to d/c.   Difficult to place patient No  Level of care: Progressive  Consultants:  Pulmonary   Procedures:  Chest tube placement 9/15   Objective: Vitals:   08/12/21 2331 08/13/21 0110 08/13/21 0400 08/13/21 0741  BP: 105/73 115/74 121/75   Pulse: (!) 112 (!) 117 (!) 115   Resp: 11 15 14    Temp: 98 F (36.7 C) 97.6 F (36.4 C)  98 F (36.7 C)   TempSrc: Oral Oral Oral   SpO2:  99% 97% 96%  Weight:      Height:        Intake/Output Summary (Last 24 hours) at 08/13/2021 1256 Last data filed at 08/13/2021 0600 Gross per 24 hour  Intake 20 ml  Output 350 ml  Net -330 ml    Filed Weights   07/30/21 1133 08/06/21 1255 08/12/21 1524  Weight: 88.9 kg 91.9 kg 95.7 kg    Examination:  Constitutional: In bed, appears uncomfortable Eyes: Anicteric ENMT: Mucous membranes are moist.  Neck: normal, supple Respiratory: CTA bilaterally, no wheezing.  Chest tube in place Cardiovascular: Regular rate and rhythm, no murmurs, no edema Abdomen: Soft, NT, ND, bowel sounds positive Musculoskeletal: no clubbing / cyanosis.  Skin: No rashes seen Neurologic: Nonfocal  Data Reviewed: I have independently reviewed following labs and imaging studies   CBC: Recent Labs  Lab 08/07/21 0347 08/09/21 0420 08/11/21 0311 08/12/21 0419 08/13/21 0603  WBC 24.2* 25.1* 28.3* 25.7* 22.0*  NEUTROABS 20.9* 22.2* 24.7*  22.3*  --   HGB 9.0* 8.8* 8.5* 8.2* 7.9*  HCT 29.7* 29.5* 29.0* 27.6* 26.6*  MCV 94.3 93.1 94.8 94.2 94.0  PLT 347 362 382 353 432    Basic Metabolic Panel: Recent Labs  Lab 08/07/21 0347 08/09/21 0420 08/13/21 0603  NA 136 143 137  K 4.0 4.3 4.6  CL 104 109 107  CO2 22 23 24   GLUCOSE 81 94 157*  BUN 9 9 14   CREATININE 0.66 0.77 0.86  CALCIUM 8.7* 9.2 8.9    Liver Function Tests: No results for input(s): AST, ALT, ALKPHOS, BILITOT, PROT, ALBUMIN in the last 168 hours. Coagulation Profile: No results for input(s): INR, PROTIME in the last 168 hours. HbA1C: No results for input(s): HGBA1C in the last 72 hours. CBG: Recent Labs  Lab 08/12/21 1158 08/12/21 1606 08/12/21 2005 08/13/21 0747 08/13/21 1143  GLUCAP 152* 142* 155* 151* 154*     Recent Results (from the past 240 hour(s))  Body fluid culture w Gram Stain     Status: None (Preliminary result)   Collection Time: 08/12/21  5:00 PM    Specimen: Body Fluid  Result Value Ref Range Status   Specimen Description   Final    FLUID PLEURAL RIGHT Performed at [facility] [address], [place], [place] [address]    Special Requests   Final    NONE Performed at [facility], [place] [address], [place], [place] [address]    Gram Stain   Final    ABUNDANT WBC PRESENT, PREDOMINANTLY MONONUCLEAR ABUNDANT GRAM NEGATIVE RODS RARE GRAM POSITIVE COCCI IN CHAINS Performed at [facility], [place] [address], [place], [place] [address]    Culture PENDING  Incomplete   Report Status PENDING  Incomplete     Radiology Studies: No results found.  Marzetta Board, MD, PhD [facility]  Between 7 am - 7 pm I am available, please contact me via Amion (for emergencies) or Securechat (non urgent messages)  Between 7 pm - 7 am I am not available, please contact night coverage MD/APP via Amion

## 2021-08-13 NOTE — Procedures (Signed)
Pleural Fibrinolytic Administration Procedure Note  William Farrell  [id number]  Jan 20, 1961  Date:08/13/21  Time:11:41 AM   Provider Performing:Evania Lyne P Carlis Abbott   Procedure: Pleural Fibrinolysis Subsequent day [phone])  Indication(s) Fibrinolysis of complicated pleural effusion  Consent Risks of the procedure as well as the alternatives and risks of each were explained to the patient and/or caregiver.  Consent for the procedure was obtained.   Anesthesia None   Time Out Verified patient identification, verified procedure, site/side was marked, verified correct patient position, special equipment/implants available, medications/allergies/relevant history reviewed, required imaging and test results available.   Sterile Technique Hand hygiene, gloves   Procedure Description Existing pleural catheter was cleaned and accessed in sterile manner.  10mg  of tPA in 30cc of saline and 5mg  of dornase in 30cc of sterile water were injected into pleural space using existing pleural catheter.  Catheter will be clamped for 1 hour and then placed back to suction.   Complications/Tolerance None; patient tolerated the procedure well.   EBL None   Specimen(s) None  Julian Hy, DO 08/13/21 11:41 AM [facility]

## 2021-08-13 NOTE — Progress Notes (Signed)
Daily Progress Note   Patient Name: William Farrell       Date: 08/13/2021 DOB: May 08, 1961  Age: 60 y.o. MRN#: [id number] Attending Physician: Caren Griffins, MD Primary Care Physician: [name], DO Admit Date: 07/30/2021  Reason for Consultation/Follow-up: Establishing goals of care  Patient Profile/HPI:  60 y.o. male  with past medical history of stage IIIa non-small cell lung cancer currently on chemotherapy, hyperlipidemia, diabetes type 2 admitted on 07/30/2021 with cc of fatigue, poor oral intake for 2 weeks prior to admission. On workup found to have AFib RVR, DKA, and loculated right pleural effusion as well as progression of lung cancer. He is s/p chest tube placement, on antibiotics. AFib resolved to NSR after cardizem gtt, AKI at baseline, WBC trending down from loculated effusion/infection (45 -> 19 today). Anemia and malnutrition likely secondary to malignancy, dietician has seen/made recommendations. He is deconditioned and PT/OT recommending HHC PT/OT.  Admission and recovery have been complicated by ongoing output from chest tube- he has an empyema and likely trapped lung.   Subjective: Patient sitting up in bed, in pain. Spouse and at bedside.   Patient is using  IV Dilaudid   for management of his symptoms. Wants to go home, but now understands scope of current hospitalization.   Review of Systems  Constitutional:  Positive for malaise/fatigue.  Respiratory:  Positive for shortness of breath.   Cardiovascular:  Positive for chest pain.    Physical Exam Vitals and nursing note reviewed.  Constitutional:      Appearance: He is ill-appearing.  Cardiovascular:     Rate and Rhythm: Normal rate.  Pulmonary:     Effort: Pulmonary effort is normal.  Skin:     General: Skin is warm and dry.     Coloration: Skin is pale.  Neurological:     Motor: Weakness present.            Vital Signs: BP 121/75   Pulse (!) 115   Temp 98 F (36.7 C) (Oral)   Resp 14   Ht 6' (1.829 m)   Wt 95.7 kg   SpO2 96%   BMI 28.62 kg/m  SpO2: SpO2: 96 % O2 Device: O2 Device: Nasal Cannula (pt wanted O2 was on 4l decreased tro 2l) O2 Flow Rate: O2 Flow Rate (L/min): 2 L/min  Intake/output summary:  Intake/Output Summary (Last 24 hours) at 08/13/2021 1149 Last data filed at 08/13/2021 0600 Gross per 24 hour  Intake 20 ml  Output 350 ml  Net -330 ml    LBM: Last BM Date: 08/08/21 Baseline Weight: Weight: 95.4 kg Most recent weight: Weight: 95.7 kg       Palliative Assessment/Data: PPS: 50%      Patient Active Problem List   Diagnosis Date Noted  . Palliative care by specialist   . Shortness of breath   . Empyema of right pleural space ([place]) 08/04/2021  . Malnutrition of moderate degree 07/31/2021  . Atrial fibrillation with rapid ventricular response ([place]) 07/30/2021  . Pleural effusion, right   . Radiation-induced esophageal stricture   . Cough 04/08/2021  . Dysphagia   . Gastroesophageal reflux disease with esophagitis without hemorrhage   . Peptic stricture of esophagus   . Hiatal hernia   . Gastritis and gastroduodenitis   . Insomnia 01/01/2021  . Port-A-Cath in place 12/04/2020  . Encounter for antineoplastic immunotherapy 06/04/2020  . Malignant neoplasm of right upper lobe of lung ([place]) 03/13/2020  . Encounter for antineoplastic chemotherapy 03/13/2020  . Goals of care, counseling/discussion 03/13/2020  . Lung mass 03/11/2020  . Gastroesophageal reflux disease without esophagitis 02/15/2018  . Type 2 diabetes mellitus without complication, without long-term current use of insulin ([place]) 07/07/2017  . Hyperlipidemia 07/07/2017    Palliative Care Assessment & Plan    Assessment/Recommendations/Plan  Chart reviewed, patient seen  and examined, discussed with patient and wife Ms. [name] who was present at the bedside.  Reviewed with them again about patient's current condition as well as underlying illnesses.  Patient lives at home with his wife in [place], [place].  They do not have children.          He does want to go home towards the end of this hospitalization.     Overall plan is for home with home health care, recommend home based palliative and outpatient oncology follow up. Full code for now.  Code Status: Full code Full code for now, recommend outpatient palliative.   Prognosis:  Unable to determine Appears guarded. Discharge Planning: To Be Determined  Care plan was discussed with patient and spouse.   Thank you for allowing the Palliative Medicine Team to assist in the care of this patient.  Total time: 25 mins Prolonged billing:No      Greater than 50%  of this time was spent counseling and coordinating care related to the above assessment and plan.  Loistine Chance, MD  palliative Medicine   Please contact Palliative Medicine Team phone at [phone] for questions and concerns from 7 AM to 7 PM, after 7 PM, please contact primary service.

## 2021-08-14 ENCOUNTER — Inpatient Hospital Stay (HOSPITAL_COMMUNITY): Payer: BC Managed Care – PPO

## 2021-08-14 DIAGNOSIS — J869 Pyothorax without fistula: Secondary | ICD-10-CM | POA: Diagnosis not present

## 2021-08-14 DIAGNOSIS — I4891 Unspecified atrial fibrillation: Secondary | ICD-10-CM | POA: Diagnosis not present

## 2021-08-14 DIAGNOSIS — Z9689 Presence of other specified functional implants: Secondary | ICD-10-CM | POA: Diagnosis not present

## 2021-08-14 LAB — BASIC METABOLIC PANEL
Anion gap: 10 (ref 5–15)
BUN: 17 mg/dL (ref 6–20)
CO2: 23 mmol/L (ref 22–32)
Calcium: 8.8 mg/dL — ABNORMAL LOW (ref 8.9–10.3)
Chloride: 100 mmol/L (ref 98–111)
Creatinine, Ser: 1.05 mg/dL (ref 0.61–1.24)
GFR, Estimated: >60 mL/min (ref >60)
Glucose, Bld: 121 mg/dL — ABNORMAL HIGH (ref 70–99)
Potassium: 4.8 mmol/L (ref 3.5–5.1)
Sodium: 133 mmol/L — ABNORMAL LOW (ref 135–145)

## 2021-08-14 LAB — CBC
HCT: 26.7 % — ABNORMAL LOW (ref 39.0–52.0)
Hemoglobin: 8 g/dL — ABNORMAL LOW (ref 13.0–17.0)
MCH: 27.9 pg (ref 26.0–34.0)
MCHC: 30 g/dL (ref 30.0–36.0)
MCV: 93 fL (ref 80.0–100.0)
Platelets: 400 10*3/uL (ref 150–400)
RBC: 2.87 MIL/uL — ABNORMAL LOW (ref 4.22–5.81)
RDW: 19.5 % — ABNORMAL HIGH (ref 11.5–15.5)
WBC: 20.5 10*3/uL — ABNORMAL HIGH (ref 4.0–10.5)
nRBC: 0 % (ref 0.0–0.2)

## 2021-08-14 LAB — GLUCOSE, CAPILLARY
Glucose-Capillary: 144 mg/dL — ABNORMAL HIGH (ref 70–99)
Glucose-Capillary: 216 mg/dL — ABNORMAL HIGH (ref 70–99)
Glucose-Capillary: 109 mg/dL — ABNORMAL HIGH (ref 70–99)
Glucose-Capillary: 135 mg/dL — ABNORMAL HIGH (ref 70–99)

## 2021-08-14 MED ORDER — SODIUM CHLORIDE (PF) 0.9 % IJ SOLN
10.0000 mg | Freq: Once | INTRAMUSCULAR | Status: AC
  Administered 2021-08-14: 10 mg via INTRAPLEURAL
  Filled 2021-08-14: qty 10

## 2021-08-14 MED ORDER — DRONABINOL 5 MG PO CAPS
5.0000 mg | ORAL_CAPSULE | Freq: Two times a day (BID) | ORAL | Status: DC
  Administered 2021-08-14 – 2021-08-18 (×10): 5 mg via ORAL
  Filled 2021-08-14: qty 2
  Filled 2021-08-14 (×2): qty 1
  Filled 2021-08-14: qty 2
  Filled 2021-08-14: qty 1
  Filled 2021-08-14: qty 2
  Filled 2021-08-14 (×4): qty 1

## 2021-08-14 MED ORDER — STERILE WATER FOR INJECTION IJ SOLN
5.0000 mg | Freq: Once | RESPIRATORY_TRACT | Status: AC
  Administered 2021-08-14: 5 mg via INTRAPLEURAL
  Filled 2021-08-14: qty 5

## 2021-08-14 NOTE — Procedures (Signed)
Pleural Fibrinolytic Administration Procedure Note  Koray Soter  [id number]  24-Apr-1961  Date:08/14/21  Time:11:08 AM   Provider Performing:Ruvim Risko E Rahman Ferrall   Procedure: Pleural Fibrinolysis Subsequent day (37628)  Indication(s) Fibrinolysis of complicated pleural effusion  Consent Risks of the procedure as well as the alternatives and risks of each were explained to the patient and/or caregiver.  Consent for the procedure was obtained.   Anesthesia None   Time Out Verified patient identification, verified procedure, site/side was marked, verified correct patient position, special equipment/implants available, medications/allergies/relevant history reviewed, required imaging and test results available.  Sterile Technique Hand hygiene, gloves  Procedure Description Existing pleural catheter was cleaned and accessed in sterile manner.  10mg  of tPA in 30cc of saline and 5mg  of dornase in 30cc of sterile water were injected into pleural space using existing pleural catheter at 11:00.   Plan to lay on R side x 30 minutes then L side x 30 minutes. At 1200, chest tube will be open to sahara, unclamped, and placed back to suction . Discussed with pt, pt wife, RN   Complications/Tolerance None; patient tolerated the procedure well.  EBL None   Specimen(s) None       Eliseo Gum MSN, AGACNP-BC [place] for pager  08/14/2021, 11:11 AM

## 2021-08-14 NOTE — Progress Notes (Signed)
PROGRESS NOTE  William Farrell VFI:[id number] DOB: Dec 10, 1960 DOA: 07/30/2021 PCP: [name], DO   LOS: 15 days   Brief Narrative / Interim history: Patient is a 60 year old male with history of stage IIIa non-small cell lung cancer currently on chemotherapy, hyperlipidemia, diabetes type 2 who presented here with complaints of fatigue, poor oral intake for 2 weeks before admission.  On presentation he was found to be in A. fib with RVR, DKA and also found to have loculated right-sided pleural effusion.  PCCM was consulted on presentation,now status post right chest tube placement.  Hospital course was prolonged due to significant purulent drainage from the chest tube  PCCM, oncology, palliative care following  Subjective / 24h Interval events: Tells me that he feels better than he has felt in the last few days  Assessment & Plan: Principal Problem Sepsis secondary to loculated right-sided pleural effusion, empyema-Presented with shortness of breath.  Status post chest tube placement on 9/15.  Procalcitonin was elevated.   He was on broad-spectrum antibiotics with vancomycin, cefepime, Flagyl.  Pleural fluid culture showing streptococcus intermedius, pansensitive.  He was cultured again 9/28 now showing GNR's in addition to prior microbiology, broadened to Zosyn.  Pulmonary consulted, appreciate input, getting tPA/DNase x3 days with last dose today 9/13 -Continue to monitor cultures  Active Problems Stage III non-small cell lung cancer -Follows with Dr. Julien Nordmann, he was being treated with chemo.   Oncology has been following. -Pleural fluid cytology negative for malignancy. -Outpatient follow-up  A. fib with RVR  -2 episodes.Transient, now resolved after starting on Cardizem drip.  Currently in normal sinus rhythm.  TSH normal.  Echocardiogram showed EF of 60 to 65%, normal left ventricular diastolic parameters.  Not a started on anticoagulation because of transient nature of the  A. fib with RVR/likely related to pulmonary issues. CHA2DS2VASc score of 1.  Currently on aspirin.   -Blood pressure stable, continue low-dose metoprolol   DKA/diabetes type 2  -Initially started on insulin drip now transitioned to subcutaneous insulin.  Takes metformin, glipizide, Ozempic at home.  Monitor blood sugars.  Continue current insulin regimen.  Hemoglobin A1c of 6.6.  Sugars were rrunning low so Lantus discontinued.  CBGs acceptable  CBG (last 3)  Recent Labs    08/13/21 1722 08/13/21 2224 08/14/21 0748  GLUCAP 85 123* 144*    AKI on CKD stage IIIa -Creatinine was in the range of 2 on presentation.  Baseline creatinine around 1.3.  Currently remains at baseline   Chronic normocytic anemia -Baseline hemoglobin around 8.   No evidence of acute blood loss.  Most likely associated with underlying malignancy, chemotherapy.  Iron studies showed low iron,given IV iron infusion.  Transfused with a unit of PRBC on 08/02/2021. -Overall stable   Hyperlipidemia-On statin   Hypothyroidism-On Synthyroid   Moderate malnutrition-Secondary to malignancy.  Nutrition following   Debility/deconditioning-PT/OT evaluated the patient and recommend home health on discharge.  Scheduled Meds:  (feeding supplement) PROSource Plus  30 mL Oral BID BM   aspirin EC  81 mg Oral QHS   atorvastatin  40 mg Oral QHS   Chlorhexidine Gluconate Cloth  6 each Topical Daily   chlorpheniramine-HYDROcodone  5 mL Oral Q12H   dronabinol  5 mg Oral BID AC   enoxaparin (LOVENOX) injection  40 mg Subcutaneous Q24H   feeding supplement  1 Container Oral TID BM   ferrous sulfate  325 mg Oral Q breakfast   fluticasone  2 spray Each Nare Daily   guaiFENesin  1,200 mg Oral BID    HYDROmorphone (DILAUDID) injection  0.5 mg Intravenous Once   insulin aspart  0-15 Units Subcutaneous TID WC   insulin aspart  0-5 Units Subcutaneous QHS   levothyroxine  75 mcg Oral Q0600   lidocaine  1 patch Transdermal Daily   mouth  rinse  15 mL Mouth Rinse BID   metoprolol tartrate  12.5 mg Oral BID   multivitamin with minerals  1 tablet Oral Daily   pantoprazole  40 mg Oral Daily   revefenacin  175 mcg Nebulization Daily   senna-docusate  2 tablet Oral QHS   sodium chloride  2 spray Each Nare BID   sodium chloride flush  10 mL Other Q8H   sodium chloride flush  10-40 mL Intracatheter Q12H   Continuous Infusions:  piperacillin-tazobactam (ZOSYN)  IV 3.375 g (08/14/21 0448)   PRN Meds:.acetaminophen **OR** acetaminophen, dextrose, hydrALAZINE, HYDROmorphone (DILAUDID) injection, ipratropium-albuterol, loratadine, methocarbamol, metoprolol tartrate, ondansetron **OR** ondansetron (ZOFRAN) IV, oxyCODONE-acetaminophen, phenol, sodium chloride flush, traZODone  Diet Orders (From admission, onward)     Start     Ordered   07/31/21 0733  Diet Carb Modified Fluid consistency: Thin; Room service appropriate? Yes  Diet effective now       Question Answer Comment  Diet-HS Snack? Nothing   Calorie Level Medium 1600-2000   Fluid consistency: Thin   Room service appropriate? Yes      07/31/21 0732            DVT prophylaxis: enoxaparin (LOVENOX) injection 40 mg Start: 07/31/21 1000     Code Status: Full Code  Family Communication: Wife present at bedside  Status is: Inpatient  Remains inpatient appropriate because:Inpatient level of care appropriate due to severity of illness  Dispo: The patient is from: Home              Anticipated d/c is to: Home              Patient currently is not medically stable to d/c.   Difficult to place patient No  Level of care: Progressive  Consultants:  Pulmonary   Procedures:  Chest tube placement 9/15   Objective: Vitals:   08/13/21 2227 08/14/21 0600 08/14/21 0817 08/14/21 1040  BP: (!) 89/68 111/67  113/67  Pulse: (!) 108 (!) 104  94  Resp:      Temp: 98.7 F (37.1 C) 98.6 F (37 C)    TempSrc: Oral Oral    SpO2: 98%  97% 90%  Weight:      Height:         Intake/Output Summary (Last 24 hours) at 08/14/2021 1119 Last data filed at 08/14/2021 0900 Gross per 24 hour  Intake 1150 ml  Output 790 ml  Net 360 ml    Filed Weights   07/30/21 1133 08/06/21 1255 08/12/21 1524  Weight: 88.9 kg 91.9 kg 95.7 kg    Examination:  Constitutional: No distress, in bed Eyes: Anicteric ENMT: Moist mucous membranes Neck: normal, supple Respiratory: Lungs are clear bilaterally, no wheezing, chest tube in place Cardiovascular: Regular rate and rhythm, no murmurs, no peripheral edema Abdomen: Soft, NT, ND, positive bowel sounds Musculoskeletal: no clubbing / cyanosis.  Skin: No rashes seen Neurologic: No focal deficits  Data Reviewed: I have independently reviewed following labs and imaging studies   CBC: Recent Labs  Lab 08/09/21 0420 08/11/21 0311 08/12/21 0419 08/13/21 0603 08/14/21 0449  WBC 25.1* 28.3* 25.7* 22.0* 20.5*  NEUTROABS 22.2* 24.7* 22.3*  --   --  HGB 8.8* 8.5* 8.2* 7.9* 8.0*  HCT 29.5* 29.0* 27.6* 26.6* 26.7*  MCV 93.1 94.8 94.2 94.0 93.0  PLT 362 382 353 356 025    Basic Metabolic Panel: Recent Labs  Lab 08/09/21 0420 08/13/21 0603 08/14/21 0449  NA 143 137 133*  K 4.3 4.6 4.8  CL 109 107 100  CO2 23 24 23   GLUCOSE 94 157* 121*  BUN 9 14 17   CREATININE 0.77 0.86 1.05  CALCIUM 9.2 8.9 8.8*    Liver Function Tests: No results for input(s): AST, ALT, ALKPHOS, BILITOT, PROT, ALBUMIN in the last 168 hours. Coagulation Profile: No results for input(s): INR, PROTIME in the last 168 hours. HbA1C: No results for input(s): HGBA1C in the last 72 hours. CBG: Recent Labs  Lab 08/13/21 0747 08/13/21 1143 08/13/21 1722 08/13/21 2224 08/14/21 0748  GLUCAP 151* 154* 85 123* 144*     Recent Results (from the past 240 hour(s))  Body fluid culture w Gram Stain     Status: None (Preliminary result)   Collection Time: 08/12/21  5:00 PM   Specimen: Body Fluid  Result Value Ref Range Status   Specimen  Description   Final    FLUID PLEURAL RIGHT Performed at [facility] [address] [place], [place] [address]    Special Requests   Final    NONE Performed at [facility], [place] [address] [place], [place] [address]    Gram Stain   Final    ABUNDANT WBC PRESENT, PREDOMINANTLY MONONUCLEAR ABUNDANT GRAM NEGATIVE RODS RARE GRAM POSITIVE COCCI IN CHAINS    Culture   Final    CULTURE REINCUBATED FOR BETTER GROWTH Performed at [facility], [place] [address], [place], [place] [address]    Report Status PENDING  Incomplete     Radiology Studies: DG CHEST PORT 1 VIEW  Result Date: 08/14/2021 CLINICAL DATA:  One hundred pneumothorax EXAM: PORTABLE CHEST 1 VIEW COMPARISON:  08/10/2021 FINDINGS: Right basilar chest tube remains in place, unchanged. Small right pleural effusion. Right apical pneumothorax again noted, not significantly changed. Masslike opacity in the right upper lobe with airspace disease in the right mid and lower lung, stable. No confluent opacity on the left. Possible small left effusion. IMPRESSION: Right basilar chest tube remains in place with small right effusion and right apical pneumothorax, not significantly changed. Right upper lobe masslike opacity again noted with areas of right mid and lower lung airspace opacity, unchanged. Electronically Signed   By: Rolm Baptise M.D.   On: 08/14/2021 07:15    Marzetta Board, MD, PhD [facility]  Between 7 am - 7 pm I am available, please contact me via Amion (for emergencies) or Securechat (non urgent messages)  Between 7 pm - 7 am I am not available, please contact night coverage MD/APP via Amion

## 2021-08-14 NOTE — Progress Notes (Signed)
NAME:  William Farrell, MRN:  [id number], DOB:  [birth year], LOS: 3 ADMISSION DATE:  07/30/2021, CONSULTATION DATE:  9/15 REFERRING MD:  Marylyn Ishihara, CHIEF COMPLAINT:  Dyspnea, weakness   History of Present Illness:  60 y/o male with known RUL lung cancer who was admitted in the setting of dyspnea and a pleural effusion.  He was found to have an empyema in the right lung when a pigtail catheter was placed.   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  New trace pleural effusion, similar post treatment appearance of lung mass compared to prior, multiple small pulmonary nodules  Micro Pleural fluid cx > pan-sensitive Strep intermedius  Abx: Cefepime 9/15 -18 Vanc 9/15 -16 Flagyl 9/15-18 Rocephin 9/18 -28 Pip/tazo 9/29>>>  Interim History / Subjective:   Says he feels better today than yesterday Wife at bedside and states that pt has not been laying on R side after tpa/dornase due to chest tube discomfort  Objective   Blood pressure 113/67, pulse 94, temperature 98.6 F (37 C), temperature source Oral, resp. rate 14, height 6' (1.829 m), weight 95.7 kg, SpO2 90 %.        Intake/Output Summary (Last 24 hours) at 08/14/2021 1329 Last data filed at 08/14/2021 0900 Gross per 24 hour  Intake 1150 ml  Output 790 ml  Net 360 ml   Filed Weights   07/30/21 1133 08/06/21 1255 08/12/21 1524  Weight: 88.9 kg 91.9 kg 95.7 kg     Examination:  General: acute and chronically ill appearing M seated in bed  HENT:NCAt pink mm anicteric sclera  PULM: R sided pigtail with white output. Symmetrical chest expansion. Productive cough  CV:  rrr s1s2 cap refill brisk  GI: soft ndnt  RKY:HCWCB edema no acute joint deformity Derm: Port. Skin pale c/d/w Neuro: Awake alert oriented x3 following commands  Psych: withdrawn affect. 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  Right sided empyema  -strep intermedius initially, but repeat cx suggestive of second pathogen  P -f/u repeat cx -9/30 was day 3/3 tpa/dornase  -pip tazo -chest tube to suction, flush per protocol -PRN analgesia -- oxycodone, dilaudid  -chest tube to suction, flush per pigtail protocol -AM CXR  Guarded prognosis   Best Practice (right click and "Reselect all SmartList Selections" daily)   Per [facility]  Labs   CBC: Recent Labs  Lab 08/09/21 0420 08/11/21 0311 08/12/21 0419 08/13/21 0603 08/14/21 0449  WBC 25.1* 28.3* 25.7* 22.0* 20.5*  NEUTROABS 22.2* 24.7* 22.3*  --   --   HGB 8.8* 8.5* 8.2* 7.9* 8.0*  HCT 29.5* 29.0* 27.6* 26.6* 26.7*  MCV 93.1 94.8 94.2 94.0 93.0  PLT 362 382 353 356 762    Basic Metabolic Panel: Recent Labs  Lab 08/09/21 0420 08/13/21 0603 08/14/21 0449  NA 143 137 133*  K 4.3 4.6  4.8  CL 109 107 100  CO2 23 24 23   GLUCOSE 94 157* 121*  BUN 9 14 17   CREATININE 0.77 0.86 1.05  CALCIUM 9.2 8.9 8.8*   GFR: Estimated Creatinine Clearance: 89.7 mL/min (by C-G formula based on SCr of 1.05 mg/dL). Recent Labs  Lab 08/11/21 0311 08/12/21 0419 08/13/21 0603 08/14/21 0449  WBC 28.3* 25.7* 22.0* 20.5*      Eliseo Gum MSN, AGACNP-BC [place] for pager 08/14/2021, 1:29 PM

## 2021-08-14 NOTE — Progress Notes (Signed)
PHYSICAL THERAPY  Pt and visitor sleeping hard so did not disturb.  Pt getting OOB PRN with nursing.    Rica Koyanagi  PTA Acute  Rehabilitation Services Pager      [phone] Office      [phone]

## 2021-08-15 ENCOUNTER — Inpatient Hospital Stay (HOSPITAL_COMMUNITY): Payer: BC Managed Care – PPO

## 2021-08-15 DIAGNOSIS — C3411 Malignant neoplasm of upper lobe, right bronchus or lung: Secondary | ICD-10-CM | POA: Diagnosis not present

## 2021-08-15 DIAGNOSIS — Z7189 Other specified counseling: Secondary | ICD-10-CM | POA: Diagnosis not present

## 2021-08-15 DIAGNOSIS — R0602 Shortness of breath: Secondary | ICD-10-CM | POA: Diagnosis not present

## 2021-08-15 DIAGNOSIS — Z515 Encounter for palliative care: Secondary | ICD-10-CM | POA: Diagnosis not present

## 2021-08-15 DIAGNOSIS — J869 Pyothorax without fistula: Secondary | ICD-10-CM | POA: Diagnosis not present

## 2021-08-15 LAB — GLUCOSE, CAPILLARY
Glucose-Capillary: 172 mg/dL — ABNORMAL HIGH (ref 70–99)
Glucose-Capillary: 189 mg/dL — ABNORMAL HIGH (ref 70–99)
Glucose-Capillary: 147 mg/dL — ABNORMAL HIGH (ref 70–99)
Glucose-Capillary: 138 mg/dL — ABNORMAL HIGH (ref 70–99)

## 2021-08-15 LAB — COMPREHENSIVE METABOLIC PANEL
ALT: 12 U/L (ref 0–44)
AST: 21 U/L (ref 15–41)
Albumin: 1.8 g/dL — ABNORMAL LOW (ref 3.5–5.0)
Alkaline Phosphatase: 146 U/L — ABNORMAL HIGH (ref 38–126)
Anion gap: 7 (ref 5–15)
BUN: 18 mg/dL (ref 6–20)
CO2: 25 mmol/L (ref 22–32)
Calcium: 8.6 mg/dL — ABNORMAL LOW (ref 8.9–10.3)
Chloride: 103 mmol/L (ref 98–111)
Creatinine, Ser: 1.02 mg/dL (ref 0.61–1.24)
GFR, Estimated: >60 mL/min (ref >60)
Glucose, Bld: 172 mg/dL — ABNORMAL HIGH (ref 70–99)
Potassium: 4.6 mmol/L (ref 3.5–5.1)
Sodium: 135 mmol/L (ref 135–145)
Total Bilirubin: 0.4 mg/dL (ref 0.3–1.2)
Total Protein: 6.2 g/dL — ABNORMAL LOW (ref 6.5–8.1)

## 2021-08-15 LAB — CBC
HCT: 28.9 % — ABNORMAL LOW (ref 39.0–52.0)
Hemoglobin: 8.4 g/dL — ABNORMAL LOW (ref 13.0–17.0)
MCH: 27.5 pg (ref 26.0–34.0)
MCHC: 29.1 g/dL — ABNORMAL LOW (ref 30.0–36.0)
MCV: 94.8 fL (ref 80.0–100.0)
Platelets: 483 10*3/uL — ABNORMAL HIGH (ref 150–400)
RBC: 3.05 MIL/uL — ABNORMAL LOW (ref 4.22–5.81)
RDW: 19.4 % — ABNORMAL HIGH (ref 11.5–15.5)
WBC: 17.7 10*3/uL — ABNORMAL HIGH (ref 4.0–10.5)
nRBC: 0 % (ref 0.0–0.2)

## 2021-08-15 MED ORDER — DIGOXIN 0.25 MG/ML IJ SOLN
0.2500 mg | Freq: Once | INTRAMUSCULAR | Status: AC
  Administered 2021-08-15: 0.25 mg via INTRAVENOUS
  Filled 2021-08-15: qty 2

## 2021-08-15 MED ORDER — SODIUM CHLORIDE 0.9 % IV BOLUS
500.0000 mL | Freq: Once | INTRAVENOUS | Status: AC
  Administered 2021-08-15: 500 mL via INTRAVENOUS

## 2021-08-15 MED ORDER — AMIODARONE HCL IN DEXTROSE 360-4.14 MG/200ML-% IV SOLN
30.0000 mg/h | INTRAVENOUS | Status: DC
  Administered 2021-08-15 – 2021-08-16 (×2): 30 mg/h via INTRAVENOUS
  Filled 2021-08-15 (×2): qty 200

## 2021-08-15 MED ORDER — AMIODARONE LOAD VIA INFUSION
150.0000 mg | Freq: Once | INTRAVENOUS | Status: AC
  Administered 2021-08-15: 150 mg via INTRAVENOUS
  Filled 2021-08-15: qty 83.34

## 2021-08-15 MED ORDER — AMIODARONE HCL IN DEXTROSE 360-4.14 MG/200ML-% IV SOLN
60.0000 mg/h | INTRAVENOUS | Status: DC
  Administered 2021-08-15 (×2): 60 mg/h via INTRAVENOUS
  Filled 2021-08-15 (×2): qty 200

## 2021-08-15 MED ORDER — SODIUM CHLORIDE 0.9 % IV BOLUS
1000.0000 mL | Freq: Once | INTRAVENOUS | Status: AC
  Administered 2021-08-15: 1000 mL via INTRAVENOUS

## 2021-08-15 NOTE — Progress Notes (Signed)
Patient complaining of SOB chest tube assessed an small air leak. VS documented Rapid response nurse Pamala Hurry) notified to come to unit assess chest tube with writer.  Dr. Melvyn Novas and Dr Cruzita Lederer notified of findings new orders received

## 2021-08-15 NOTE — Progress Notes (Signed)
   08/15/21 0749  Vitals  Temp (!) 97.5 F (36.4 C)  Temp Source Oral  BP 101/82  MAP (mmHg) 89  BP Location Right Arm  BP Method Automatic  Patient Position (if appropriate) Lying  Pulse Rate Source Monitor  ECG Heart Rate (!) 130  Resp 13  Level of Consciousness  Level of Consciousness Alert  Oxygen Therapy  SpO2 94 %  O2 Device Nasal Cannula  O2 Flow Rate (L/min) 4 L/min  Pain Assessment  Pain Scale 0-10  Pain Score 7  Pain Type Acute pain  Pain Location Chest  Pain Orientation Right;Mid;Upper  Pain Descriptors / Indicators Aching  Pain Intervention(s) MD notified (Comment);Repositioned  Complaints & Interventions  Complains of Shortness of breath  Interventions Reposition;Relaxation  PCA/Epidural/Spinal Assessment  Respiratory Pattern Regular  Glasgow Coma Scale  Eye Opening 4  Best Verbal Response (NON-intubated) 5  Best Motor Response 6  Glasgow Coma Scale Score 15  MEWS Score  MEWS Temp 0  MEWS Systolic 0  MEWS Pulse 3  MEWS RR 1  MEWS LOC 0  MEWS Score 4  MEWS Score Color Red  Provider Notification  Provider Name/Title Dr Cruzita Lederer  Date Provider Notified 08/15/21  Time Provider Notified [phone]  Notification Type Rounds  Notification Reason Change in status;Other (Comment) (patient c/o chest pain with fast HR)  Test performed and critical result EKG  Date Critical Result Received 08/15/21  Time Critical Result Received 0800  Provider response At bedside  Date of Provider Response 08/15/21  Time of Provider Response 0800  Rapid Response Notification  Name of Rapid Response RN Notified Pamala Hurry May, RN  Date Rapid Response Notified 08/15/21  Time Rapid Response Notified 0820  Note  Observations  (Patient anxious. Vital signs documented  MD at bedside new orders recieved)

## 2021-08-15 NOTE — Progress Notes (Signed)
NAME:  William Farrell, MRN:  [id number], DOB:  01-23-1961, LOS: 80 ADMISSION DATE:  07/30/2021, CONSULTATION DATE:  9/15 REFERRING MD:  Marylyn Ishihara, CHIEF COMPLAINT:  Dyspnea, weakness   History of Present Illness:  60 y/o male with known RUL lung cancer who was admitted in the setting of dyspnea and a pleural effusion.  He was found to have an empyema in the right lung when a pigtail catheter was placed.   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  New trace pleural effusion, similar post treatment appearance of lung mass compared to prior, multiple small pulmonary nodules  Micro Pleural fluid cx > pan-sensitive Strep intermedius R pleural fluid 9/28 multiple strep species   Abx: Cefepime 9/15 -18 Vanc 9/15 -16 Flagyl 9/15-18 Rocephin 9/18 -28 Pip/tazo 9/29>>>   Scheduled Meds:  (feeding supplement) PROSource Plus  30 mL Oral BID BM   aspirin EC  81 mg Oral QHS   atorvastatin  40 mg Oral QHS   Chlorhexidine Gluconate Cloth  6 each Topical Daily   chlorpheniramine-HYDROcodone  5 mL Oral Q12H   dronabinol  5 mg Oral BID AC   enoxaparin (LOVENOX) injection  40 mg Subcutaneous Q24H   feeding supplement  1 Container Oral TID BM   ferrous sulfate  325 mg Oral Q breakfast   fluticasone  2 spray Each Nare Daily   guaiFENesin  1,200 mg Oral BID    HYDROmorphone (DILAUDID) injection  0.5 mg  Intravenous Once   insulin aspart  0-15 Units Subcutaneous TID WC   insulin aspart  0-5 Units Subcutaneous QHS   levothyroxine  75 mcg Oral Q0600   lidocaine  1 patch Transdermal Daily   mouth rinse  15 mL Mouth Rinse BID   metoprolol tartrate  12.5 mg Oral BID   multivitamin with minerals  1 tablet Oral Daily   pantoprazole  40 mg Oral Daily   revefenacin  175 mcg Nebulization Daily   senna-docusate  2 tablet Oral QHS   sodium chloride  2 spray Each Nare BID   sodium chloride flush  10 mL Other Q8H   sodium chloride flush  10-40 mL Intracatheter Q12H   Continuous Infusions:  amiodarone 30 mg/hr (08/15/21 1502)   piperacillin-tazobactam (ZOSYN)  IV 12.5 mL/hr at 08/15/21 1502   PRN Meds:.acetaminophen **OR** acetaminophen, dextrose, hydrALAZINE, HYDROmorphone (DILAUDID) injection, ipratropium-albuterol, loratadine, methocarbamol, metoprolol tartrate, ondansetron **OR** ondansetron (ZOFRAN) IV, oxyCODONE-acetaminophen, phenol, sodium chloride flush, traZODone  Interim History / Subjective:  Back to step down for sob/ rapid afib > amiodarone drip   Objective   Blood pressure 102/74, pulse 84, temperature (!) 96.4 F (35.8 C), temperature source Axillary, resp. rate 15, height 6' (1.829 m), weight 95.7 kg, SpO2 96 %.        Intake/Output Summary (Last 24 hours) at 08/15/2021 1631 Last data filed at 08/15/2021 1502 Gross per 24 hour  Intake 2440.91 ml  Output 800 ml  Net 1640.91 ml   Filed Weights   07/30/21  1133 08/06/21 1255 08/12/21 1524  Weight: 88.9 kg 91.9 kg 95.7 kg    Examination: Tmax  97.9  General appearance:    more chronically/ terminally ill appearing than acutely ill, better p dilaudid   At Rest 02 sats  ok on RA @ present  No jvd Oropharynx clear,  mucosa nl Neck supple Lungs no wheeze, a few scatted rhonchi on R with decreased bs / no bubbling from chest tube. RRR no s3 or or sign murmur>  NSR on amiodarone drip  Abd soft with limited  excursion  Extr  warm, trace edema  Neuro  Sensorium intact ,  no apparent motor deficits     I personally reviewed images and agree with radiology impression as follows:  CXR:   portable 10/1  1. The right apical pneumothorax remains small but is larger in the interval with a stable right chest tube. 2. Masslike opacity in the right upper lobe is stable. 3. Small bilateral pleural effusions. 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     Best Practice (right click and "Reselect all SmartList Selections" daily)   Per [facility]  Labs   CBC: Recent Labs  Lab 08/09/21 0420 08/11/21 0311 08/12/21 0419 08/13/21 0603 08/14/21 0449 08/15/21 0515  WBC 25.1* 28.3* 25.7* 22.0* 20.5* 17.7*  NEUTROABS 22.2* 24.7* 22.3*  --   --   --   HGB 8.8* 8.5* 8.2* 7.9* 8.0* 8.4*  HCT 29.5* 29.0* 27.6* 26.6* 26.7* 28.9*  MCV 93.1 94.8 94.2 94.0 93.0 94.8  PLT 362 382 353 356 400 483*    Basic Metabolic Panel: Recent Labs  Lab 08/09/21 0420 08/13/21 0603 08/14/21 0449 08/15/21 0515  NA 143 137 133* 135  K 4.3 4.6 4.8 4.6  CL 109 107 100 103  CO2 23 24 23 25   GLUCOSE 94 157* 121* 172*  BUN 9 14 17 18   CREATININE 0.77 0.86 1.05 1.02  CALCIUM 9.2 8.9 8.8* 8.6*   GFR: Estimated  Creatinine Clearance: 92.4 mL/min (by C-G formula based on SCr of 1.02 mg/dL). Recent Labs  Lab 08/12/21 0419 08/13/21 0603 08/14/21 0449 08/15/21 0515  WBC 25.7* 22.0* 20.5* 17.7*    Christinia Gully, MD Pulmonary and [place] [phone]   After 7:00 pm call Elink  [phone]

## 2021-08-15 NOTE — Progress Notes (Addendum)
0100 Patient A.Fib HR [phone] MD notified [address] 0113 Order received to push Digoxin 0.25mg  IV OTO 0128 Digoxin 0.25mg  IV with Di Kindle RN 0148 Pt still A.Fib HR 140-180 MD notified orders received. 500 ML Bolus NS and Metoprolol 5 mg IV.  0200 A.Fib HR 110-120 BP 111/68 0400 Pt A.Fib HR 150-170s. Orders received 500 ML NS Bolus and another Metoprolol 5mg  IV given. BP 100/66 HR 166

## 2021-08-15 NOTE — Plan of Care (Signed)
Discussed with patient plan of care for the evening, pain management and clustering care with some teach back displayed.   Problem: Education: Goal: Knowledge of General Education information will improve Description: Including pain rating scale, medication(s)/side effects and non-pharmacologic comfort measures Outcome: Progressing

## 2021-08-15 NOTE — Progress Notes (Signed)
PROGRESS NOTE  William Farrell BJS:[id number] DOB: 1961-11-09 DOA: 07/30/2021 PCP: [name], DO   LOS: 16 days   Brief Narrative / Interim history: Patient is a 60 year old male with history of stage IIIa non-small cell lung cancer currently on chemotherapy, hyperlipidemia, diabetes type 2 who presented here with complaints of fatigue, poor oral intake for 2 weeks before admission.  On presentation he was found to be in A. fib with RVR, DKA and also found to have loculated right-sided pleural effusion.  PCCM was consulted on presentation,now status post right chest tube placement.  Hospital course was prolonged due to significant purulent drainage from the chest tube  PCCM, oncology, palliative care following  Subjective / 24h Interval events: Complains of chest pain, does not feel better this morning.  Seems very anxious.  Assessment & Plan: Principal Problem Sepsis secondary to loculated right-sided pleural effusion, empyema-Presented with shortness of breath.  Status post chest tube placement on 9/15.  Procalcitonin was elevated.   He was on broad-spectrum antibiotics with vancomycin, cefepime, Flagyl.  Pleural fluid culture showing streptococcus intermedius, pansensitive.  He was cultured again 9/28 now showing GNR's in addition to prior microbiology, broadened to Zosyn.  Pulmonary consulted, appreciate input, getting tPA/DNase x3 days with last dose today 9/13 -Culture still without an organism, reintubated for better growth  Active Problems A. fib with RVR  -2 episodes since admission, today he is having his third episode.  He is intermittently hypotensive and cannot use Cardizem drip.  Did not respond to digoxin, given blood pressure in the 80s-90s start amiodarone infusion. -TSH normal.  Echocardiogram showed EF of 60 to 65%, normal left ventricular diastolic parameters.  Not started on anticoagulation because of transient nature of the A. fib with RVR/likely related to  pulmonary issues. CHA2DS2VASc score of 1.  Currently on aspirin.  If it persists anticoagulation needs to be readdressed   DKA/diabetes type 2  -Initially started on insulin drip now transitioned to subcutaneous insulin.  Takes metformin, glipizide, Ozempic at home.  Monitor blood sugars.  Continue current insulin regimen.  Hemoglobin A1c of 6.6.  Sugars were rrunning low so Lantus discontinued.  CBGs remain acceptable today  CBG (last 3)  Recent Labs    08/14/21 2012 08/15/21 0826 08/15/21 1144  GLUCAP 135* 172* 189*    AKI on CKD stage IIIa -Creatinine was in the range of 2 on presentation.  Baseline creatinine around 1.3.  Currently at baseline   Chronic normocytic anemia -Baseline hemoglobin around 8.   No evidence of acute blood loss.  Most likely associated with underlying malignancy, chemotherapy.  Iron studies showed low iron,given IV iron infusion.  Transfused with a unit of PRBC on 08/02/2021. -Hemoglobin stable, 8.4.  We will aim to keep above 8 given A. fib with RVR  Stage III non-small cell lung cancer -Follows with Dr. Julien Nordmann, he was being treated with chemo.   Oncology has been following. -Pleural fluid cytology negative for malignancy. -Outpatient follow-up   Hyperlipidemia-On statin   Hypothyroidism-On Synthyroid   Moderate malnutrition-Secondary to malignancy.  Nutrition following   Debility/deconditioning-PT/OT evaluated the patient and recommend home health on discharge.  Scheduled Meds:  (feeding supplement) PROSource Plus  30 mL Oral BID BM   aspirin EC  81 mg Oral QHS   atorvastatin  40 mg Oral QHS   Chlorhexidine Gluconate Cloth  6 each Topical Daily   chlorpheniramine-HYDROcodone  5 mL Oral Q12H   dronabinol  5 mg Oral BID AC   enoxaparin (LOVENOX)  injection  40 mg Subcutaneous Q24H   feeding supplement  1 Container Oral TID BM   ferrous sulfate  325 mg Oral Q breakfast   fluticasone  2 spray Each Nare Daily   guaiFENesin  1,200 mg Oral BID     HYDROmorphone (DILAUDID) injection  0.5 mg Intravenous Once   insulin aspart  0-15 Units Subcutaneous TID WC   insulin aspart  0-5 Units Subcutaneous QHS   levothyroxine  75 mcg Oral Q0600   lidocaine  1 patch Transdermal Daily   mouth rinse  15 mL Mouth Rinse BID   metoprolol tartrate  12.5 mg Oral BID   multivitamin with minerals  1 tablet Oral Daily   pantoprazole  40 mg Oral Daily   revefenacin  175 mcg Nebulization Daily   senna-docusate  2 tablet Oral QHS   sodium chloride  2 spray Each Nare BID   sodium chloride flush  10 mL Other Q8H   sodium chloride flush  10-40 mL Intracatheter Q12H   Continuous Infusions:  amiodarone 60 mg/hr (08/15/21 1025)   Followed by   amiodarone     piperacillin-tazobactam (ZOSYN)  IV 3.375 g (08/15/21 0503)   PRN Meds:.acetaminophen **OR** acetaminophen, dextrose, hydrALAZINE, HYDROmorphone (DILAUDID) injection, ipratropium-albuterol, loratadine, methocarbamol, metoprolol tartrate, ondansetron **OR** ondansetron (ZOFRAN) IV, oxyCODONE-acetaminophen, phenol, sodium chloride flush, traZODone  Diet Orders (From admission, onward)     Start     Ordered   07/31/21 0733  Diet Carb Modified Fluid consistency: Thin; Room service appropriate? Yes  Diet effective now       Question Answer Comment  Diet-HS Snack? Nothing   Calorie Level Medium 1600-2000   Fluid consistency: Thin   Room service appropriate? Yes      07/31/21 0732            DVT prophylaxis: enoxaparin (LOVENOX) injection 40 mg Start: 07/31/21 1000     Code Status: Full Code  Family Communication: Wife present at bedside  Status is: Inpatient  Remains inpatient appropriate because:Inpatient level of care appropriate due to severity of illness  Dispo: The patient is from: Home              Anticipated d/c is to: Home              Patient currently is not medically stable to d/c.   Difficult to place patient No  Level of care: Progressive  Consultants:  Pulmonary    Procedures:  Chest tube placement 9/15   Objective: Vitals:   08/15/21 0900 08/15/21 1006 08/15/21 1100 08/15/21 1109  BP: 96/74 98/77 98/83  98/83  Pulse: 96  (!) 103 (!) 103  Resp: 15 17 (!) 21 (!) 22  Temp: (!) 97.5 F (36.4 C) (!) 97.4 F (36.3 C) 97.8 F (36.6 C) 97.8 F (36.6 C)  TempSrc: Axillary Axillary Axillary Axillary  SpO2: 95% 98% 100% 98%  Weight:      Height:        Intake/Output Summary (Last 24 hours) at 08/15/2021 1201 Last data filed at 08/15/2021 0600 Gross per 24 hour  Intake 2092.15 ml  Output 800 ml  Net 1292.15 ml    Filed Weights   07/30/21 1133 08/06/21 1255 08/12/21 1524  Weight: 88.9 kg 91.9 kg 95.7 kg    Examination:  Constitutional: Appears distressed, sitting upright Eyes: No scleral icterus ENMT: mmm Neck: normal, supple Respiratory: No wheezing, no crackles, tachypneic Cardiovascular: Irregularly irregular, tachycardic, trace edema Abdomen: Soft, NT, ND, positive bowel sounds Musculoskeletal: no  clubbing / cyanosis.  Skin: No rashes seen Neurologic: Nonfocal, equal strength  Data Reviewed: I have independently reviewed following labs and imaging studies   CBC: Recent Labs  Lab 08/09/21 0420 08/11/21 0311 08/12/21 0419 08/13/21 0603 08/14/21 0449 08/15/21 0515  WBC 25.1* 28.3* 25.7* 22.0* 20.5* 17.7*  NEUTROABS 22.2* 24.7* 22.3*  --   --   --   HGB 8.8* 8.5* 8.2* 7.9* 8.0* 8.4*  HCT 29.5* 29.0* 27.6* 26.6* 26.7* 28.9*  MCV 93.1 94.8 94.2 94.0 93.0 94.8  PLT 362 382 353 356 400 483*    Basic Metabolic Panel: Recent Labs  Lab 08/09/21 0420 08/13/21 0603 08/14/21 0449 08/15/21 0515  NA 143 137 133* 135  K 4.3 4.6 4.8 4.6  CL 109 107 100 103  CO2 23 24 23 25   GLUCOSE 94 157* 121* 172*  BUN 9 14 17 18   CREATININE 0.77 0.86 1.05 1.02  CALCIUM 9.2 8.9 8.8* 8.6*    Liver Function Tests: Recent Labs  Lab 08/15/21 0515  AST 21  ALT 12  ALKPHOS 146*  BILITOT 0.4  PROT 6.2*  ALBUMIN 1.8*   Coagulation  Profile: No results for input(s): INR, PROTIME in the last 168 hours. HbA1C: No results for input(s): HGBA1C in the last 72 hours. CBG: Recent Labs  Lab 08/14/21 1203 08/14/21 1718 08/14/21 2012 08/15/21 0826 08/15/21 1144  GLUCAP 216* 109* 135* 172* 189*     Recent Results (from the past 240 hour(s))  Body fluid culture w Gram Stain     Status: None (Preliminary result)   Collection Time: 08/12/21  5:00 PM   Specimen: Body Fluid  Result Value Ref Range Status   Specimen Description   Final    FLUID PLEURAL RIGHT Performed at [facility] [address] [place], [place] [address]    Special Requests   Final    NONE Performed at [facility], [place] [address] [place], [place] [address]    Gram Stain   Final    ABUNDANT WBC PRESENT, PREDOMINANTLY MONONUCLEAR ABUNDANT GRAM NEGATIVE RODS RARE GRAM POSITIVE COCCI IN CHAINS    Culture   Final    CULTURE REINCUBATED FOR BETTER GROWTH Performed at [facility], [place] [address] [place], [place] [address]    Report Status PENDING  Incomplete     Radiology Studies: DG CHEST PORT 1 VIEW  Result Date: 08/15/2021 CLINICAL DATA:  Empyema.  Known right upper lobe lung cancer. EXAM: PORTABLE CHEST 1 VIEW COMPARISON:  August 14, 2021 FINDINGS: Masslike opacity in the right upper lobe remains, similar in the interval. The right apical pneumothorax remains small but is larger in the interval. The right chest tube is stable. Right pleural fluid is stable. A small left effusion is stable. No change in the cardiomediastinal silhouette. No change in the left Port-A-Cath. IMPRESSION: 1. The right apical pneumothorax remains small but is larger in the interval with a stable right chest tube. 2. Masslike opacity in the right upper lobe is stable. 3. Small bilateral pleural effusions. Electronically Signed   By: Dorise Bullion III M.D.   On: 08/15/2021 08:26    Marzetta Board, MD, PhD [facility]  Between 7 am - 7 pm I am available, please contact me via Amion (for emergencies) or Securechat (non urgent messages)  Between 7 pm - 7 am I am not available, please contact night coverage MD/APP via Amion

## 2021-08-15 NOTE — Progress Notes (Signed)
Daily Progress Note   Patient Name: William Farrell       Date: 08/15/2021 DOB: January 24, 1961  Age: 60 y.o. MRN#: [id number] Attending Physician: Caren Griffins, MD Primary Care Physician: [name], DO Admit Date: 07/30/2021  Reason for Consultation/Follow-up: Establishing goals of care  Patient Profile/HPI:  60 y.o. male  with past medical history of stage IIIa non-small cell lung cancer currently on chemotherapy, hyperlipidemia, diabetes type 2 admitted on 07/30/2021 with cc of fatigue, poor oral intake for 2 weeks prior to admission. On workup found to have AFib RVR, DKA, and loculated right pleural effusion as well as progression of lung cancer. He is s/p chest tube placement, on antibiotics. AFib resolved to NSR after cardizem gtt, AKI at baseline, WBC trending down from loculated effusion/infection (45 -> 19 today). Anemia and malnutrition likely secondary to malignancy, dietician has seen/made recommendations. He is deconditioned and PT/OT recommending HHC PT/OT.  Admission and recovery have been complicated by ongoing output from chest tube- he has an empyema and likely trapped lung.   Subjective: Patient is now in stepdown unit, awake, just got pain medication, wife at bedside.    Review of Systems  Constitutional:  Positive for malaise/fatigue.  Respiratory:  Positive for shortness of breath.   Cardiovascular:  Positive for chest pain.    Physical Exam Vitals and nursing note reviewed.  Constitutional:      Appearance: He is ill-appearing.  Cardiovascular:     Rate and Rhythm: Normal rate.  Pulmonary:     Effort: Pulmonary effort is normal.  Skin:    General: Skin is warm and dry.     Coloration: Skin is pale.  Neurological:     Motor: Weakness present.             Vital Signs: BP 102/74 (BP Location: Right Arm)   Pulse 84   Temp (!) 96.4 F (35.8 C) (Axillary)   Resp 15   Ht 6' (1.829 m)   Wt 95.7 kg   SpO2 96%   BMI 28.62 kg/m  SpO2: SpO2: 96 % O2 Device: O2 Device: Room Air O2 Flow Rate: O2 Flow Rate (L/min): 4 L/min  Intake/output summary:  Intake/Output Summary (Last 24 hours) at 08/15/2021 1523 Last data filed at 08/15/2021 1502 Gross per 24 hour  Intake 2440.91 ml  Output  800 ml  Net 1640.91 ml    LBM: Last BM Date: 08/12/21 Baseline Weight: Weight: 95.4 kg Most recent weight: Weight: 95.7 kg       Palliative Assessment/Data: PPS: 50%      Patient Active Problem List   Diagnosis Date Noted  . Palliative care by specialist   . Shortness of breath   . Empyema of right pleural space ([place]) 08/04/2021  . Malnutrition of moderate degree 07/31/2021  . Atrial fibrillation with rapid ventricular response ([place]) 07/30/2021  . Pleural effusion, right   . Radiation-induced esophageal stricture   . Cough 04/08/2021  . Dysphagia   . Gastroesophageal reflux disease with esophagitis without hemorrhage   . Peptic stricture of esophagus   . Hiatal hernia   . Gastritis and gastroduodenitis   . Insomnia 01/01/2021  . Port-A-Cath in place 12/04/2020  . Encounter for antineoplastic immunotherapy 06/04/2020  . Malignant neoplasm of right upper lobe of lung ([place]) 03/13/2020  . Encounter for antineoplastic chemotherapy 03/13/2020  . Goals of care, counseling/discussion 03/13/2020  . Lung mass 03/11/2020  . Gastroesophageal reflux disease without esophagitis 02/15/2018  . Type 2 diabetes mellitus without complication, without long-term current use of insulin ([place]) 07/07/2017  . Hyperlipidemia 07/07/2017    Palliative Care Assessment & Plan    Assessment/Recommendations/Plan  Chart reviewed, patient seen and examined, discussed with patient and wife Ms. [name] who was present at the bedside: re discussed about how this  hospitalization is going and re addressed goals of care.  Patient and wife remain hopeful for stabilization, and then for home with home health care. Wife states that chest tube is no longer leaking.  Continue to monitor. Discussed with Dr Cruzita Lederer.    Code Status: Full code Full code for now, recommend outpatient palliative.   Prognosis:  Guarded  Appears guarded. Discharge Planning: To Be Determined  Care plan was discussed with patient and spouse.   Thank you for allowing the Palliative Medicine Team to assist in the care of this patient.  Total time: 25 mins Prolonged billing:No      Greater than 50%  of this time was spent counseling and coordinating care related to the above assessment and plan.  Loistine Chance, MD  palliative Medicine   Please contact Palliative Medicine Team phone at [phone] for questions and concerns from 7 AM to 7 PM, after 7 PM, please contact primary service.

## 2021-08-16 ENCOUNTER — Inpatient Hospital Stay (HOSPITAL_COMMUNITY): Payer: BC Managed Care – PPO

## 2021-08-16 DIAGNOSIS — R0602 Shortness of breath: Secondary | ICD-10-CM | POA: Diagnosis not present

## 2021-08-16 DIAGNOSIS — Z7189 Other specified counseling: Secondary | ICD-10-CM | POA: Diagnosis not present

## 2021-08-16 DIAGNOSIS — L8990 Pressure ulcer of unspecified site, unspecified stage: Secondary | ICD-10-CM | POA: Insufficient documentation

## 2021-08-16 DIAGNOSIS — Z515 Encounter for palliative care: Secondary | ICD-10-CM | POA: Diagnosis not present

## 2021-08-16 DIAGNOSIS — J869 Pyothorax without fistula: Secondary | ICD-10-CM | POA: Diagnosis not present

## 2021-08-16 DIAGNOSIS — M7989 Other specified soft tissue disorders: Secondary | ICD-10-CM

## 2021-08-16 LAB — GLUCOSE, CAPILLARY
Glucose-Capillary: 139 mg/dL — ABNORMAL HIGH (ref 70–99)
Glucose-Capillary: 166 mg/dL — ABNORMAL HIGH (ref 70–99)
Glucose-Capillary: 162 mg/dL — ABNORMAL HIGH (ref 70–99)
Glucose-Capillary: 119 mg/dL — ABNORMAL HIGH (ref 70–99)

## 2021-08-16 LAB — CBC
HCT: 25.9 % — ABNORMAL LOW (ref 39.0–52.0)
Hemoglobin: 7.7 g/dL — ABNORMAL LOW (ref 13.0–17.0)
MCH: 27.3 pg (ref 26.0–34.0)
MCHC: 29.7 g/dL — ABNORMAL LOW (ref 30.0–36.0)
MCV: 91.8 fL (ref 80.0–100.0)
Platelets: 437 10*3/uL — ABNORMAL HIGH (ref 150–400)
RBC: 2.82 MIL/uL — ABNORMAL LOW (ref 4.22–5.81)
RDW: 19.6 % — ABNORMAL HIGH (ref 11.5–15.5)
WBC: 12.1 10*3/uL — ABNORMAL HIGH (ref 4.0–10.5)
nRBC: 0 % (ref 0.0–0.2)

## 2021-08-16 LAB — BASIC METABOLIC PANEL
Anion gap: 10 (ref 5–15)
BUN: 20 mg/dL (ref 6–20)
CO2: 23 mmol/L (ref 22–32)
Calcium: 8.7 mg/dL — ABNORMAL LOW (ref 8.9–10.3)
Chloride: 104 mmol/L (ref 98–111)
Creatinine, Ser: 1.05 mg/dL (ref 0.61–1.24)
GFR, Estimated: >60 mL/min (ref >60)
Glucose, Bld: 161 mg/dL — ABNORMAL HIGH (ref 70–99)
Potassium: 4 mmol/L (ref 3.5–5.1)
Sodium: 137 mmol/L (ref 135–145)

## 2021-08-16 LAB — BODY FLUID CULTURE W GRAM STAIN

## 2021-08-16 MED ORDER — HEPARIN (PORCINE) 25000 UT/250ML-% IV SOLN
1500.0000 [IU]/h | INTRAVENOUS | Status: DC
  Administered 2021-08-16: 1500 [IU]/h via INTRAVENOUS
  Filled 2021-08-16 (×2): qty 250

## 2021-08-16 MED ORDER — METOPROLOL TARTRATE 25 MG PO TABS
25.0000 mg | ORAL_TABLET | Freq: Two times a day (BID) | ORAL | Status: DC
  Administered 2021-08-16 – 2021-08-18 (×5): 25 mg via ORAL
  Filled 2021-08-16 (×5): qty 1

## 2021-08-16 MED ORDER — SODIUM CHLORIDE 0.9 % IV SOLN
INTRAVENOUS | Status: DC | PRN
  Administered 2021-08-16 – 2021-08-18 (×2): 250 mL via INTRAVENOUS

## 2021-08-16 NOTE — Progress Notes (Signed)
PROGRESS NOTE  William Farrell JOA:[id number] DOB: Jun 12, 1961 DOA: 07/30/2021 PCP: [name], DO   LOS: 17 days   Brief Narrative / Interim history: Patient is a 60 year old male with history of stage IIIa non-small cell lung cancer currently on chemotherapy, hyperlipidemia, diabetes type 2 who presented here with complaints of fatigue, poor oral intake for 2 weeks before admission.  On presentation he was found to be in A. fib with RVR, DKA and also found to have loculated right-sided pleural effusion.  PCCM was consulted on presentation,now status post right chest tube placement.  Hospital course was prolonged due to significant purulent drainage from the chest tube  PCCM, oncology, palliative care following  Subjective / 24h Interval events: Feels a little better today than he was yesterday afternoon.  Converted back to sinus rhythm.  Still has shortness of breath and pain at the chest tube site  Assessment & Plan: Principal Problem Sepsis secondary to loculated right-sided pleural effusion, empyema-patient presented to the hospital with shortness of breath, and due to the loculated right-sided pleural effusion pulmonology was consulted and he is status post chest tube placement on 9/15.  There was concern for infection with elevated procalcitonin and initially was placed on broad-spectrum antibiotics.  Pleural fluid culture showed initially Streptococcus intermedius, pansensitive.  He was transitioned to ceftriaxone.  Due to lack of response, persistent leukocytosis and persistent drainage from the chest tube, his fluid was cultured again and received tPA/DNase x3 days, and now also showing Streptococcus constellatus, Streptococcus anginosus as well as Prevotella Denticola, beta-lactamase positive.  Continue Zosyn, Case briefly discussed with Dr. Linus Salmons with ID  Active Problems A. fib with RVR  - patient has had 3 episodes since admission, most recently requiring amiodarone to  convert back to sinus rhythm -TSH normal.  Echocardiogram showed EF of 60 to 65%, normal left ventricular diastolic parameters.  Not started on anticoagulation because of transient nature of the A. fib with RVR/likely related to pulmonary issues. CHA2DS2VASc score of 1.  Currently on aspirin.  If it persists anticoagulation needs to be readdressed   DKA/diabetes type 2  -Initially started on insulin drip now transitioned to subcutaneous insulin.  Takes metformin, glipizide, Ozempic at home.  Monitor blood sugars.  Continue current insulin regimen.  Hemoglobin A1c of 6.6.  Sugars were rrunning low so Lantus discontinued.  CBGs remain acceptable today  CBG (last 3)  Recent Labs    08/15/21 1656 08/15/21 2137 08/16/21 0755  GLUCAP 147* 138* 139*    AKI on CKD stage IIIa -Creatinine was in the range of 2 on presentation.  Baseline creatinine around 1.3.  Currently at baseline  Left lower extremity swelling-DVT rule out with Dopplers today  Chronic normocytic anemia -Baseline hemoglobin around 8.   No evidence of acute blood loss.  Most likely associated with underlying malignancy, chemotherapy.  Iron studies showed low iron,given IV iron infusion.  Transfused with a unit of PRBC on 08/02/2021. -Hemoglobin overall stable, slightly lower today at 7.7 due to receiving fluids yesterday and likely dilutional component  Stage III non-small cell lung cancer -Follows with Dr. Julien Nordmann, he was being treated with chemo.   Oncology has been following. -Pleural fluid cytology negative for malignancy. -Outpatient follow-up   Hyperlipidemia-On statin   Hypothyroidism-On Synthyroid   Moderate malnutrition-Secondary to malignancy.  Nutrition following   Debility/deconditioning-PT/OT evaluated the patient and recommend home health on discharge.  Scheduled Meds:  (feeding supplement) PROSource Plus  30 mL Oral BID BM   aspirin EC  81 mg Oral QHS   atorvastatin  40 mg Oral QHS   Chlorhexidine  Gluconate Cloth  6 each Topical Daily   chlorpheniramine-HYDROcodone  5 mL Oral Q12H   dronabinol  5 mg Oral BID AC   enoxaparin (LOVENOX) injection  40 mg Subcutaneous Q24H   feeding supplement  1 Container Oral TID BM   ferrous sulfate  325 mg Oral Q breakfast   fluticasone  2 spray Each Nare Daily   guaiFENesin  1,200 mg Oral BID    HYDROmorphone (DILAUDID) injection  0.5 mg Intravenous Once   insulin aspart  0-15 Units Subcutaneous TID WC   insulin aspart  0-5 Units Subcutaneous QHS   levothyroxine  75 mcg Oral Q0600   lidocaine  1 patch Transdermal Daily   mouth rinse  15 mL Mouth Rinse BID   metoprolol tartrate  25 mg Oral BID   multivitamin with minerals  1 tablet Oral Daily   pantoprazole  40 mg Oral Daily   revefenacin  175 mcg Nebulization Daily   senna-docusate  2 tablet Oral QHS   sodium chloride  2 spray Each Nare BID   sodium chloride flush  10 mL Other Q8H   sodium chloride flush  10-40 mL Intracatheter Q12H   Continuous Infusions:  piperacillin-tazobactam (ZOSYN)  IV 12.5 mL/hr at 08/16/21 0654   PRN Meds:.acetaminophen **OR** acetaminophen, dextrose, hydrALAZINE, HYDROmorphone (DILAUDID) injection, ipratropium-albuterol, loratadine, methocarbamol, metoprolol tartrate, ondansetron **OR** ondansetron (ZOFRAN) IV, oxyCODONE-acetaminophen, phenol, sodium chloride flush, traZODone  Diet Orders (From admission, onward)     Start     Ordered   07/31/21 0733  Diet Carb Modified Fluid consistency: Thin; Room service appropriate? Yes  Diet effective now       Question Answer Comment  Diet-HS Snack? Nothing   Calorie Level Medium 1600-2000   Fluid consistency: Thin   Room service appropriate? Yes      07/31/21 0732            DVT prophylaxis: enoxaparin (LOVENOX) injection 40 mg Start: 07/31/21 1000     Code Status: Full Code  Family Communication: Wife present at bedside  Status is: Inpatient  Remains inpatient appropriate because:Inpatient level of care  appropriate due to severity of illness  Dispo: The patient is from: Home              Anticipated d/c is to: Home              Patient currently is not medically stable to d/c.   Difficult to place patient No  Level of care: Stepdown  Consultants:  Pulmonary   Procedures:  Chest tube placement 9/15   Objective: Vitals:   08/16/21 0600 08/16/21 0652 08/16/21 0800 08/16/21 0825  BP: (!) 104/52     Pulse: 79 80    Resp: 19 20    Temp:   97.9 F (36.6 C)   TempSrc:   Oral   SpO2: 97% 100%  100%  Weight:      Height:        Intake/Output Summary (Last 24 hours) at 08/16/2021 0907 Last data filed at 08/16/2021 0654 Gross per 24 hour  Intake 1552.33 ml  Output 650 ml  Net 902.33 ml    Filed Weights   07/30/21 1133 08/06/21 1255 08/12/21 1524  Weight: 88.9 kg 91.9 kg 95.7 kg    Examination:  Constitutional: Appears comfortable, in bed Eyes: Anicteric ENMT: mmm Neck: normal, supple Respiratory: Moves air well bilaterally, no wheezing, no crackles  Cardiovascular: Regular rate and rhythm, no murmurs, trace edema left greater than right Abdomen: Soft, NT, ND, positive bowel sounds Musculoskeletal: no clubbing / cyanosis.  Skin: No rashes seen Neurologic: No focal deficits  Data Reviewed: I have independently reviewed following labs and imaging studies   CBC: Recent Labs  Lab 08/11/21 0311 08/12/21 0419 08/13/21 0603 08/14/21 0449 08/15/21 0515 08/16/21 0300  WBC 28.3* 25.7* 22.0* 20.5* 17.7* 12.1*  NEUTROABS 24.7* 22.3*  --   --   --   --   HGB 8.5* 8.2* 7.9* 8.0* 8.4* 7.7*  HCT 29.0* 27.6* 26.6* 26.7* 28.9* 25.9*  MCV 94.8 94.2 94.0 93.0 94.8 91.8  PLT 382 353 356 400 483* 437*    Basic Metabolic Panel: Recent Labs  Lab 08/13/21 0603 08/14/21 0449 08/15/21 0515 08/16/21 0300  NA 137 133* 135 137  K 4.6 4.8 4.6 4.0  CL 107 100 103 104  CO2 24 23 25 23   GLUCOSE 157* 121* 172* 161*  BUN 14 17 18 20   CREATININE 0.86 1.05 1.02 1.05  CALCIUM 8.9  8.8* 8.6* 8.7*    Liver Function Tests: Recent Labs  Lab 08/15/21 0515  AST 21  ALT 12  ALKPHOS 146*  BILITOT 0.4  PROT 6.2*  ALBUMIN 1.8*    Coagulation Profile: No results for input(s): INR, PROTIME in the last 168 hours. HbA1C: No results for input(s): HGBA1C in the last 72 hours. CBG: Recent Labs  Lab 08/15/21 0826 08/15/21 1144 08/15/21 1656 08/15/21 2137 08/16/21 0755  GLUCAP 172* 189* 147* 138* 139*     Recent Results (from the past 240 hour(s))  Body fluid culture w Gram Stain     Status: None (Preliminary result)   Collection Time: 08/12/21  5:00 PM   Specimen: Body Fluid  Result Value Ref Range Status   Specimen Description   Final    FLUID PLEURAL RIGHT Performed at [facility] [address], [place], [place] [address]    Special Requests   Final    NONE Performed at [facility], [place] [address], [place], [place] [address]    Gram Stain   Final    ABUNDANT WBC PRESENT, PREDOMINANTLY MONONUCLEAR ABUNDANT GRAM NEGATIVE RODS RARE GRAM POSITIVE COCCI IN CHAINS    Culture   Final    ABUNDANT STREPTOCOCCUS CONSTELLATUS ABUNDANT STREPTOCOCCUS ANGINOSIS SUSCEPTIBILITIES TO FOLLOW ABUNDANT PREVOTELLA DENTICOLA BETA LACTAMASE POSITIVE Performed at [facility], [place] [address] [place], [place] [address]    Report Status PENDING  Incomplete     Radiology Studies: No results found.  Marzetta Board, MD, PhD [facility]  Between 7 am - 7 pm I am available, please contact me via Amion (for emergencies) or Securechat (non urgent messages)  Between 7 pm - 7 am I am not available, please contact night coverage MD/APP via Amion

## 2021-08-16 NOTE — Progress Notes (Signed)
BLE venous duplex has been completed.  Preliminary findings given to [place], Therapist, sports.   Results can be found under chart review under CV PROC. 08/16/2021 11:37 AM Chanti Golubski RVT, RDMS

## 2021-08-16 NOTE — Progress Notes (Signed)
SLP Cancellation Note  Patient Details Name: William Farrell MRN: [id number] DOB: [birth year]   Cancelled treatment:        Chest tube still in place. Pt requests to wait for MBSS until chest tube has been removed. SLP service will follow as appropriate.   [name] P Abbie Jablon 08/16/2021, 10:54 AM

## 2021-08-16 NOTE — Progress Notes (Signed)
ANTICOAGULATION CONSULT NOTE - Initial Consult  Pharmacy Consult for Heparin  Indication: DVT  Allergies  Allergen Reactions   Sitagliptin Other (See Comments)    headache    Patient Measurements: Height: 6' (182.9 cm) Weight: 95.7 kg (211 lb) IBW/kg (Calculated) : 77.6 Heparin Dosing Weight: 96 kg  Vital Signs: Temp: 96.6 F (35.9 C) (10/02 1200) Temp Source: Axillary (10/02 1200) BP: 130/55 (10/02 1500) Pulse Rate: 73 (10/02 1500)  Labs: Recent Labs    08/14/21 0449 08/15/21 0515 08/16/21 0300  HGB 8.0* 8.4* 7.7*  HCT 26.7* 28.9* 25.9*  PLT 400 483* 437*  CREATININE 1.05 1.02 1.05    Estimated Creatinine Clearance: 89.7 mL/min (by C-G formula based on SCr of 1.05 mg/dL).   Medical History: Past Medical History:  Diagnosis Date   Asthma    as a child   Diabetes ([place])    Diverticulitis    GERD (gastroesophageal reflux disease)    High cholesterol    History of kidney stones    Lung cancer (HCC)    Wears glasses    Assessment: 60 y/o M with NSCLC on chemo admitted with sepsis due to loculated pleural effusion and empyema. Dopplers today for left LE swelling revealed DVT. Pharmacy consulted for heparin dosing. Last dose of prophylaxis Lovenox was this AM.   Goal of Therapy:  Heparin level 0.3-0.7 units/ml Monitor platelets by anticoagulation protocol: Yes   Plan:  Start heparin infusion at 1500 units/hr Check anti-Xa level in 6 hours and daily while on heparin Continue to monitor H&H and platelets  Ulice Dash D 08/16/2021,3:15 PM

## 2021-08-17 ENCOUNTER — Encounter: Payer: Self-pay | Admitting: Internal Medicine

## 2021-08-17 ENCOUNTER — Inpatient Hospital Stay (HOSPITAL_COMMUNITY): Payer: BC Managed Care – PPO

## 2021-08-17 DIAGNOSIS — R0602 Shortness of breath: Secondary | ICD-10-CM | POA: Diagnosis not present

## 2021-08-17 DIAGNOSIS — Z515 Encounter for palliative care: Secondary | ICD-10-CM | POA: Diagnosis not present

## 2021-08-17 DIAGNOSIS — J869 Pyothorax without fistula: Secondary | ICD-10-CM | POA: Diagnosis not present

## 2021-08-17 DIAGNOSIS — Z9689 Presence of other specified functional implants: Secondary | ICD-10-CM | POA: Diagnosis not present

## 2021-08-17 DIAGNOSIS — I4891 Unspecified atrial fibrillation: Secondary | ICD-10-CM | POA: Diagnosis not present

## 2021-08-17 LAB — BASIC METABOLIC PANEL
Anion gap: 7 (ref 5–15)
BUN: 14 mg/dL (ref 6–20)
CO2: 25 mmol/L (ref 22–32)
Calcium: 8.7 mg/dL — ABNORMAL LOW (ref 8.9–10.3)
Chloride: 108 mmol/L (ref 98–111)
Creatinine, Ser: 0.85 mg/dL (ref 0.61–1.24)
GFR, Estimated: >60 mL/min (ref >60)
Glucose, Bld: 111 mg/dL — ABNORMAL HIGH (ref 70–99)
Potassium: 4 mmol/L (ref 3.5–5.1)
Sodium: 140 mmol/L (ref 135–145)

## 2021-08-17 LAB — CBC
HCT: 26.2 % — ABNORMAL LOW (ref 39.0–52.0)
Hemoglobin: 7.9 g/dL — ABNORMAL LOW (ref 13.0–17.0)
MCH: 27.8 pg (ref 26.0–34.0)
MCHC: 30.2 g/dL (ref 30.0–36.0)
MCV: 92.3 fL (ref 80.0–100.0)
Platelets: 479 10*3/uL — ABNORMAL HIGH (ref 150–400)
RBC: 2.84 MIL/uL — ABNORMAL LOW (ref 4.22–5.81)
RDW: 19.5 % — ABNORMAL HIGH (ref 11.5–15.5)
WBC: 13.6 10*3/uL — ABNORMAL HIGH (ref 4.0–10.5)
nRBC: 0 % (ref 0.0–0.2)

## 2021-08-17 LAB — GLUCOSE, CAPILLARY
Glucose-Capillary: 126 mg/dL — ABNORMAL HIGH (ref 70–99)
Glucose-Capillary: 148 mg/dL — ABNORMAL HIGH (ref 70–99)
Glucose-Capillary: 129 mg/dL — ABNORMAL HIGH (ref 70–99)
Glucose-Capillary: 130 mg/dL — ABNORMAL HIGH (ref 70–99)

## 2021-08-17 LAB — HEPARIN LEVEL (UNFRACTIONATED): Heparin Unfractionated: 0.34 IU/mL (ref 0.30–0.70)

## 2021-08-17 MED ORDER — SODIUM CHLORIDE 0.9 % IV SOLN
3.0000 g | Freq: Four times a day (QID) | INTRAVENOUS | Status: DC
  Administered 2021-08-17 – 2021-08-18 (×5): 3 g via INTRAVENOUS
  Filled 2021-08-17 (×6): qty 8

## 2021-08-17 MED ORDER — APIXABAN 5 MG PO TABS
10.0000 mg | ORAL_TABLET | Freq: Two times a day (BID) | ORAL | Status: DC
  Administered 2021-08-17 – 2021-08-18 (×3): 10 mg via ORAL
  Filled 2021-08-17 (×4): qty 2

## 2021-08-17 MED ORDER — ALTEPLASE 2 MG IJ SOLR
2.0000 mg | Freq: Once | INTRAMUSCULAR | Status: AC
  Administered 2021-08-17: 2 mg
  Filled 2021-08-17: qty 2

## 2021-08-17 MED ORDER — STERILE WATER FOR INJECTION IJ SOLN
INTRAMUSCULAR | Status: AC
  Filled 2021-08-17: qty 10

## 2021-08-17 MED ORDER — MORPHINE SULFATE ER 30 MG PO TBCR
30.0000 mg | EXTENDED_RELEASE_TABLET | Freq: Two times a day (BID) | ORAL | Status: DC
  Administered 2021-08-17 – 2021-08-18 (×2): 30 mg via ORAL
  Filled 2021-08-17 (×3): qty 1

## 2021-08-17 MED ORDER — APIXABAN 5 MG PO TABS: 5.0000 mg | ORAL_TABLET | Freq: Two times a day (BID) | ORAL | Status: DC

## 2021-08-17 NOTE — Progress Notes (Signed)
PROGRESS NOTE  William Farrell SEG:[id number] DOB: 1961-03-19 DOA: 07/30/2021 PCP: [name], DO   LOS: 18 days   Brief Narrative / Interim history: Patient is a 60 year old male with history of stage IIIa non-small cell lung cancer currently on chemotherapy, hyperlipidemia, diabetes type 2 who presented here with complaints of fatigue, poor oral intake for 2 weeks before admission.  On presentation he was found to be in A. fib with RVR, DKA and also found to have loculated right-sided pleural effusion.  PCCM was consulted on presentation,now status post right chest tube placement.  Hospital course was prolonged due to significant purulent drainage from the chest tube  PCCM, oncology, palliative care following  Subjective / 24h Interval events: Frustrated that he is in the hospital.  He wants his chest tube out and wants to go home as soon as possible.  No chest pain, no shortness of breath.  Assessment & Plan: Principal Problem Sepsis secondary to loculated right-sided pleural effusion, empyema-patient presented to the hospital with shortness of breath, and due to the loculated right-sided pleural effusion pulmonology was consulted and he is status post chest tube placement on 9/15.  There was concern for infection with elevated procalcitonin and initially was placed on broad-spectrum antibiotics.  Pleural fluid culture showed initially Streptococcus intermedius, pansensitive.  He was transitioned to ceftriaxone.  Due to lack of response, persistent leukocytosis and persistent drainage from the chest tube, his fluid was cultured again and received tPA/DNase x3 days, and now also showing Streptococcus constellatus, Streptococcus anginosus as well as Prevotella Denticola, beta-lactamase positive.  Currently on Unasyn, could be transitioned to Augmentin upon discharge. -Still has chest tube in place, management per pulmonary  Active Problems A. fib with RVR -patient has had 3 episodes  since admission, most recently requiring amiodarone to convert back to sinus rhythm -TSH normal.  Echocardiogram showed EF of 60 to 65%, normal left ventricular diastolic parameters.  He was initially not started on anticoagulation due to transient nature of the A. fib in the setting of acute illness but now he is anticoagulated due to concurrent DVT  Acute DVT-in the left gastrocnemius and left peroneal veins.  He was started on heparin, tolerated that well, transition to Eliquis   DKA/diabetes type 2 -Initially started on insulin drip now transitioned to subcutaneous insulin.  Takes metformin, glipizide, Ozempic at home.  Monitor blood sugars.  Continue current insulin regimen.  Hemoglobin A1c of 6.6.  Sugars were rrunning low so Lantus discontinued.  CBGs remain acceptable  CBG (last 3)  Recent Labs    08/16/21 1638 08/16/21 2131 08/17/21 0737  GLUCAP 162* 119* 126*    AKI on CKD stage IIIa -Creatinine was in the range of 2 on presentation.  Baseline creatinine around 1.3.  Currently at baseline  Left lower extremity swelling-DVT rule out with Dopplers today  Chronic normocytic anemia -Baseline hemoglobin around 8.   No evidence of acute blood loss.  Most likely associated with underlying malignancy, chemotherapy.  Iron studies showed low iron,given IV iron infusion.  Transfused with a unit of PRBC on 08/02/2021. -Hemoglobin overall stable, no evidence of bleeding  Stage III non-small cell lung cancer -Follows with Dr. Julien Nordmann, he was being treated with chemo.   Oncology has been following. -Pleural fluid cytology negative for malignancy. -Outpatient follow-up   Hyperlipidemia-On statin   Hypothyroidism-On Synthyroid   Moderate malnutrition-Secondary to malignancy.  Nutrition following   Debility/deconditioning-PT/OT evaluated the patient and recommend home health on discharge.  Scheduled Meds:  (feeding  supplement) PROSource Plus  30 mL Oral BID BM   apixaban  10 mg Oral  BID   Followed by   Derrill Memo ON 08/24/2021] apixaban  5 mg Oral BID   atorvastatin  40 mg Oral QHS   Chlorhexidine Gluconate Cloth  6 each Topical Daily   chlorpheniramine-HYDROcodone  5 mL Oral Q12H   dronabinol  5 mg Oral BID AC   feeding supplement  1 Container Oral TID BM   ferrous sulfate  325 mg Oral Q breakfast   fluticasone  2 spray Each Nare Daily   guaiFENesin  1,200 mg Oral BID    HYDROmorphone (DILAUDID) injection  0.5 mg Intravenous Once   insulin aspart  0-15 Units Subcutaneous TID WC   insulin aspart  0-5 Units Subcutaneous QHS   levothyroxine  75 mcg Oral Q0600   lidocaine  1 patch Transdermal Daily   mouth rinse  15 mL Mouth Rinse BID   metoprolol tartrate  25 mg Oral BID   multivitamin with minerals  1 tablet Oral Daily   pantoprazole  40 mg Oral Daily   revefenacin  175 mcg Nebulization Daily   senna-docusate  2 tablet Oral QHS   sodium chloride  2 spray Each Nare BID   sodium chloride flush  10 mL Other Q8H   sodium chloride flush  10-40 mL Intracatheter Q12H   Continuous Infusions:  sodium chloride Stopped (08/17/21 0444)   ampicillin-sulbactam (UNASYN) IV     PRN Meds:.sodium chloride, acetaminophen **OR** acetaminophen, dextrose, hydrALAZINE, HYDROmorphone (DILAUDID) injection, ipratropium-albuterol, loratadine, methocarbamol, metoprolol tartrate, ondansetron **OR** ondansetron (ZOFRAN) IV, oxyCODONE-acetaminophen, phenol, sodium chloride flush, traZODone  Diet Orders (From admission, onward)     Start     Ordered   07/31/21 0733  Diet Carb Modified Fluid consistency: Thin; Room service appropriate? Yes  Diet effective now       Question Answer Comment  Diet-HS Snack? Nothing   Calorie Level Medium 1600-2000   Fluid consistency: Thin   Room service appropriate? Yes      07/31/21 0732            DVT prophylaxis:  apixaban (ELIQUIS) tablet 10 mg  apixaban (ELIQUIS) tablet 5 mg     Code Status: Full Code  Family Communication: Wife present at  bedside  Status is: Inpatient  Remains inpatient appropriate because:Inpatient level of care appropriate due to severity of illness  Dispo: The patient is from: Home              Anticipated d/c is to: Home              Patient currently is not medically stable to d/c.   Difficult to place patient No  Level of care: Stepdown  Consultants:  Pulmonary   Procedures:  Chest tube placement 9/15   Objective: Vitals:   08/17/21 0733 08/17/21 0800 08/17/21 0900 08/17/21 1000  BP:  128/72  111/63  Pulse:  81 66 80  Resp:  14 19 (!) 21  Temp:  (!) 97.4 F (36.3 C)    TempSrc:  Axillary    SpO2: 97% 98% 100% 100%  Weight:      Height:        Intake/Output Summary (Last 24 hours) at 08/17/2021 1152 Last data filed at 08/17/2021 0600 Gross per 24 hour  Intake 518.75 ml  Output 300 ml  Net 218.75 ml    Filed Weights   07/30/21 1133 08/06/21 1255 08/12/21 1524  Weight: 88.9 kg 91.9 kg  95.7 kg    Examination:  Constitutional: Comfortable, in bed Eyes: No scleral icterus ENMT: mmm Neck: normal, supple Respiratory: No wheezing, no crackles, good air movement bilaterally.  Chest tube in place. Cardiovascular: Regular rate and rhythm, no murmurs, left lower extremity edema greater than right Abdomen: Soft, NT, ND, positive bowel sounds Musculoskeletal: no clubbing / cyanosis.  Skin: No rashes seen Neurologic: Nonfocal, equal strength  Data Reviewed: I have independently reviewed following labs and imaging studies   CBC: Recent Labs  Lab 08/11/21 0311 08/12/21 0419 08/13/21 0603 08/14/21 0449 08/15/21 0515 08/16/21 0300 08/17/21 0108  WBC 28.3* 25.7* 22.0* 20.5* 17.7* 12.1* 13.6*  NEUTROABS 24.7* 22.3*  --   --   --   --   --   HGB 8.5* 8.2* 7.9* 8.0* 8.4* 7.7* 7.9*  HCT 29.0* 27.6* 26.6* 26.7* 28.9* 25.9* 26.2*  MCV 94.8 94.2 94.0 93.0 94.8 91.8 92.3  PLT 382 353 356 400 483* 437* 479*    Basic Metabolic Panel: Recent Labs  Lab 08/13/21 0603 08/14/21 0449  08/15/21 0515 08/16/21 0300 08/17/21 0108  NA 137 133* 135 137 140  K 4.6 4.8 4.6 4.0 4.0  CL 107 100 103 104 108  CO2 24 23 25 23 25   GLUCOSE 157* 121* 172* 161* 111*  BUN 14 17 18 20 14   CREATININE 0.86 1.05 1.02 1.05 0.85  CALCIUM 8.9 8.8* 8.6* 8.7* 8.7*    Liver Function Tests: Recent Labs  Lab 08/15/21 0515  AST 21  ALT 12  ALKPHOS 146*  BILITOT 0.4  PROT 6.2*  ALBUMIN 1.8*    Coagulation Profile: No results for input(s): INR, PROTIME in the last 168 hours. HbA1C: No results for input(s): HGBA1C in the last 72 hours. CBG: Recent Labs  Lab 08/16/21 0755 08/16/21 1202 08/16/21 1638 08/16/21 2131 08/17/21 0737  GLUCAP 139* 166* 162* 119* 126*     Recent Results (from the past 240 hour(s))  Body fluid culture w Gram Stain     Status: None   Collection Time: 08/12/21  5:00 PM   Specimen: Body Fluid  Result Value Ref Range Status   Specimen Description   Final    FLUID PLEURAL RIGHT Performed at [facility] [address], [place], [place] [address]    Special Requests   Final    NONE Performed at [facility], [place] [address], [place], [place] [address]    Gram Stain   Final    ABUNDANT WBC PRESENT, PREDOMINANTLY MONONUCLEAR ABUNDANT GRAM NEGATIVE RODS RARE GRAM POSITIVE COCCI IN CHAINS    Culture   Final    ABUNDANT STREPTOCOCCUS CONSTELLATUS ABUNDANT STREPTOCOCCUS ANGINOSIS ABUNDANT PREVOTELLA DENTICOLA BETA LACTAMASE POSITIVE Performed at [facility], [place] [address] [place], [place] [address]    Report Status 08/16/2021 FINAL  Final   Organism ID, Bacteria STREPTOCOCCUS ANGINOSIS  Final   Organism ID, Bacteria STREPTOCOCCUS CONSTELLATUS  Final      Susceptibility   Streptococcus constellatus - MIC*    PENICILLIN 0.12 SENSITIVE Sensitive     CEFTRIAXONE >=8 RESISTANT Resistant     ERYTHROMYCIN <=0.12 SENSITIVE Sensitive     LEVOFLOXACIN 0.5 SENSITIVE Sensitive     * ABUNDANT STREPTOCOCCUS  CONSTELLATUS   Streptococcus anginosis - MIC*    PENICILLIN <=0.06 SENSITIVE Sensitive     CEFTRIAXONE 0.25 SENSITIVE Sensitive     ERYTHROMYCIN <=0.12 SENSITIVE Sensitive     LEVOFLOXACIN 0.5 SENSITIVE Sensitive     VANCOMYCIN 1 SENSITIVE  Sensitive     * ABUNDANT STREPTOCOCCUS ANGINOSIS     Radiology Studies: DG CHEST PORT 1 VIEW  Result Date: 08/17/2021 CLINICAL DATA:  Evaluate chest tube placement EXAM: PORTABLE CHEST 1 VIEW COMPARISON:  08/15/2021 FINDINGS: There is a right basilar chest tube in place the pigtail of which is unchanged in position from previous exam. Loculated right hydropneumothorax is again identified. Allowing for differences in technique there has been no significant change in size from previous exam. Right upper lobe lung masslike opacity is also unchanged. There is a small left pleural effusion, similar to the previous study. IMPRESSION: 1. Stable position of right chest tube. 2. No significant change in loculated right hydropneumothorax. 3. Stable left pleural effusion. 4. Stable right upper lobe lung masslike opacity. Electronically Signed   By: Kerby Moors M.D.   On: 08/17/2021 11:12    Marzetta Board, MD, PhD [facility]  Between 7 am - 7 pm I am available, please contact me via Amion (for emergencies) or Securechat (non urgent messages)  Between 7 pm - 7 am I am not available, please contact night coverage MD/APP via Amion

## 2021-08-17 NOTE — Progress Notes (Signed)
Left chest port hard to flush and no blood return noted. IV team notified. Alteplase instilled in catheter by IV team RN.

## 2021-08-17 NOTE — Progress Notes (Addendum)
NAME:  William Farrell, MRN:  [id number], DOB:  08-12-1961, LOS: 38 ADMISSION DATE:  07/30/2021, CONSULTATION DATE:  9/15 REFERRING MD:  William Farrell, CHIEF COMPLAINT:  Dyspnea, weakness   History of Present Illness:  60 y/o male with known RUL lung cancer who was admitted in the setting of dyspnea and a pleural effusion.  He was found to have an empyema in the right lung when a pigtail catheter was placed.   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  Knows he is going to daily.  He is hoping he can get the chest tube out, I spent quite some time trying to discuss with him why he needs the chest tube open.  Objective   Blood pressure 111/63, pulse 80, temperature (Abnormal) 97.4 F (36.3 C), temperature source Axillary, resp. rate (Abnormal) 21, height 6' (1.829 m), weight 95.7 kg, SpO2 100 %.        Intake/Output Summary (Last 24 hours) at 08/17/2021 1140 Last data filed at 08/17/2021 0600 Gross per 24 hour  Intake 518.75 ml  Output 300 ml  Net 218.75 ml   Filed Weights   07/30/21 1133 08/06/21 1255 08/12/21 1524  Weight: 88.9 kg 91.9 kg 95.7 kg    Examination:  General 60 year old white male sitting in bed HEENT normocephalic/atraumatic Pulmonary: Diminished bilaterally, intermittent air leak on the right, can be quite significant at times Cardiac: Regular rate and rhythm Abdomen: Soft nontender Extremities: Bilateral edema Neuro: Awake  oriented no focal deficits.  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 BPF  Af w/ RVR DM type II LLE DVT Chronic anemia Hypothyroidism  Hyperlipidema  Mod malnutrition  Deconditioning   Pulm problem list  NSCLC (stage III) with Polymicrobial right sided empyema and associated right hydropneumothorax due to trapped lung  Intermittent airleak ? BPF  -strep intermedius initially, but repeat cx suggestive of second pathogens  -9/30 was day 3/3 tpa/dornase  -pcxr personally reviewed: CT good position. Loculated right ptx. RUL mass and RML collapse. About the same. Consolidation perhaps a little worse  Plan PRN analgesia  Cont zosyn as directed by ID  Will clamp CT. If this is BPH he will get worse; if it is trapped lung I would not expect change, I discussed this with nursing staff with plan to open the chest tube back up to suction if he develops distress He really wants to go home. I can't safely get him there w/ active BPF Am chest x-ray He really should be DO NOT RESUSCITATE, he is going to die of his infection superimposed on his malignancy  Best Practice (right click and "Reselect all SmartList Selections" daily)   Per [place] ACNP-BC [place] Pager # [phone] OR # [phone] if no answer  Xxxxxxxx   ATTESTATION & SIGNATURE   STAFF NOTE: I, Dr William Farrell  have personally reviewed patient's available data, including medical history, events of note, physical examination and test results as part of my evaluation. I have discussed with resident/NP and other care providers such as pharmacist, RN and RRT.  In addition,  I personally evaluated patient and elicited key findings of   S: Date of admit 07/30/2021 with LOS 18 for today 08/17/2021 : [name] is  - dong well on 3L [place] (not on home  o2) and with chest tube clamped. AGain he reiterated desire to go home. Drained 50cc yesterday  O:  Blood pressure 122/65, pulse 81, temperature 98 F (36.7 C), temperature source Oral, resp. rate (!) 27, height 6' (1.829 m), weight 95.7 kg, SpO2 98 %.  Sitting in bed On o2 Rt chest tube +   LABS    PULMONARY No results for input(s): PHART, PCO2ART, PO2ART, HCO3, TCO2, O2SAT in the last 168 hours.  Invalid input(s): PCO2, PO2  CBC Recent Labs  Lab 08/15/21 0515 08/16/21 0300 08/17/21 0108  HGB 8.4* 7.7* 7.9*  HCT 28.9* 25.9* 26.2*  WBC 17.7* 12.1* 13.6*  PLT 483* 437* 479*    COAGULATION No results for input(s): INR in the last 168 hours.  CARDIAC  No results for input(s): TROPONINI in the last 168 hours. No results for input(s): PROBNP in the last 168 hours.   CHEMISTRY Recent Labs  Lab 08/13/21 0603 08/14/21 0449 08/15/21 0515 08/16/21 0300 08/17/21 0108  NA 137 133* 135 137 140  K 4.6 4.8 4.6 4.0 4.0  CL 107 100 103 104 108  CO2 24 23 25 23 25   GLUCOSE 157* 121* 172* 161* 111*  BUN 14 17 18 20 14   CREATININE 0.86 1.05 1.02 1.05 0.85  CALCIUM 8.9 8.8* 8.6* 8.7* 8.7*   Estimated Creatinine Clearance: 110.8 mL/min (by C-G formula based on SCr of 0.85 mg/dL).   LIVER Recent Labs  Lab 08/15/21 0515  AST 21  ALT 12  ALKPHOS 146*  BILITOT 0.4  PROT 6.2*  ALBUMIN 1.8*     INFECTIOUS No results for input(s): LATICACIDVEN, PROCALCITON in the last 168 hours.   ENDOCRINE CBG (last 3)  Recent Labs    08/17/21 0737 08/17/21 1145 08/17/21 1638  GLUCAP 126* 148* 129*         IMAGING x24h  - image(s) personally visualized  -   highlighted in bold DG CHEST PORT 1 VIEW  Result Date: 08/17/2021 CLINICAL DATA:  Evaluate chest tube placement EXAM: PORTABLE CHEST 1 VIEW COMPARISON:  08/15/2021 FINDINGS: There is a right basilar chest tube in place the pigtail of which is unchanged in position from previous exam. Loculated right  hydropneumothorax is again identified. Allowing for differences in technique there has been no significant change in size from previous exam. Right upper lobe lung masslike opacity is also unchanged. There is a small left pleural effusion, similar to the previous study. IMPRESSION: 1. Stable position of right chest tube. 2. No significant change in loculated right hydropneumothorax. 3. Stable left pleural effusion. 4. Stable right upper lobe lung masslike opacity. Electronically Signed   By: Kerby Moors M.D.   On: 08/17/2021 11:12      A: R polymicrobial empyema - s/p chest tube and IP lytics. Now concern for possible BPF Drained 50cc yesterday  P: clamp chest tube and monitor If stable, dc chest tube 08/18/21 and dc home   Anti-infectives (From admission, onward)    Start     Dose/Rate Route Frequency Ordered Stop  08/17/21 1200  Ampicillin-Sulbactam (UNASYN) 3 g in sodium chloride 0.9 % 100 mL IVPB        3 g 200 mL/hr over 30 Minutes Intravenous Every 6 hours 08/17/21 0950     08/13/21 1200  piperacillin-tazobactam (ZOSYN) IVPB 3.375 g  Status:  Discontinued        3.375 g 12.5 mL/hr over 240 Minutes Intravenous Every 8 hours 08/13/21 1101 08/17/21 0950   08/05/21 1800  cefTRIAXone (ROCEPHIN) 2 g in sodium chloride 0.9 % 100 mL IVPB  Status:  Discontinued        2 g 200 mL/hr over 30 Minutes Intravenous Every 24 hours 08/05/21 1316 08/13/21 1039   08/02/21 1800  cefTRIAXone (ROCEPHIN) 2 g in sodium chloride 0.9 % 100 mL IVPB  Status:  Discontinued        2 g 200 mL/hr over 30 Minutes Intravenous Every 24 hours 08/02/21 0947 08/05/21 1316   08/02/21 1100  vancomycin (VANCOREADY) IVPB 1250 mg/250 mL  Status:  Discontinued        1,250 mg 166.7 mL/hr over 90 Minutes Intravenous Every 12 hours 08/02/21 0726 08/02/21 0910   08/01/21 1100  vancomycin (VANCOCIN) IVPB 1000 mg/200 mL premix  Status:  Discontinued        1,000 mg 200 mL/hr over 60 Minutes Intravenous Every 12 hours  08/01/21 0951 08/02/21 0726   07/31/21 1800  vancomycin (VANCOREADY) IVPB 1250 mg/250 mL  Status:  Discontinued        1,250 mg 166.7 mL/hr over 90 Minutes Intravenous Every 24 hours 07/30/21 1911 07/31/21 0845   07/31/21 1800  vancomycin (VANCOREADY) IVPB 1500 mg/300 mL  Status:  Discontinued        1,500 mg 150 mL/hr over 120 Minutes Intravenous Every 24 hours 07/31/21 0845 08/01/21 0951   07/31/21 0945  ceFEPIme (MAXIPIME) 2 g in sodium chloride 0.9 % 100 mL IVPB  Status:  Discontinued        2 g 200 mL/hr over 30 Minutes Intravenous Every 8 hours 07/31/21 0845 08/02/21 0948   07/30/21 2000  metroNIDAZOLE (FLAGYL) IVPB 500 mg  Status:  Discontinued        500 mg 100 mL/hr over 60 Minutes Intravenous Every 12 hours 07/30/21 1738 08/03/21 1019   07/30/21 1830  vancomycin (VANCOREADY) IVPB 1750 mg/350 mL        1,750 mg 175 mL/hr over 120 Minutes Intravenous  Once 07/30/21 1738 07/30/21 1846   07/30/21 1800  ceFEPIme (MAXIPIME) 2 g in sodium chloride 0.9 % 100 mL IVPB  Status:  Discontinued        2 g 200 mL/hr over 30 Minutes Intravenous Every 12 hours 07/30/21 1723 07/31/21 0845        Rest per NP/medical resident whose note is outlined above and that I agree with    Dr. Brand Males, M.D., [facility].C.P Pulmonary and Critical Care Medicine Staff Physician [facility] Pager: [phone], If no answer or between  15:00h - 7:00h: call [phone]  08/17/2021 4:56 PM

## 2021-08-17 NOTE — Progress Notes (Signed)
ANTICOAGULATION CONSULT NOTE - Initial Consult  Pharmacy Consult for Heparin  Indication: DVT  Allergies  Allergen Reactions   Sitagliptin Other (See Comments)    headache    Patient Measurements: Height: 6' (182.9 cm) Weight: 95.7 kg (211 lb) IBW/kg (Calculated) : 77.6 Heparin Dosing Weight: 96 kg  Vital Signs: Temp: 98.1 F (36.7 C) (10/03 0100) Temp Source: Oral (10/03 0100) BP: 97/68 (10/03 0200) Pulse Rate: 78 (10/03 0200)  Labs: Recent Labs    08/15/21 0515 08/16/21 0300 08/17/21 0015 08/17/21 0108  HGB 8.4* 7.7*  --  7.9*  HCT 28.9* 25.9*  --  26.2*  PLT 483* 437*  --  479*  HEPARINUNFRC  --   --  0.34  --   CREATININE 1.02 1.05  --  0.85     Estimated Creatinine Clearance: 110.8 mL/min (by C-G formula based on SCr of 0.85 mg/dL).   Medical History: Past Medical History:  Diagnosis Date   Asthma    as a child   Diabetes ([place])    Diverticulitis    GERD (gastroesophageal reflux disease)    High cholesterol    History of kidney stones    Lung cancer (HCC)    Wears glasses    Assessment: 60 y/o M with NSCLC on chemo admitted with sepsis due to loculated pleural effusion and empyema. Dopplers today for left LE swelling revealed DVT. Pharmacy consulted for heparin dosing. Last dose of prophylaxis Lovenox was this AM.   08/17/21 Heparin level  = 0.34 (therapeutic) with heparin gtt @ 1500 units/hr Hgb = 7.9 (low-stable); Pltc 914N No complications of therapy noted by RN  Goal of Therapy:  Heparin level 0.3-0.7 units/ml Monitor platelets by anticoagulation protocol: Yes   Plan:  Continue heparin gtt @ 1500 units/hr Repeat heparin level in 6 hr to confirm therapeutic dose Daily heparin level & CBC   Saber Dickerman, Toribio Harbour, PharmD 08/17/2021,2:38 AM

## 2021-08-17 NOTE — Progress Notes (Signed)
Daily Progress Note   Patient Name: William Farrell       Date: 08/17/2021 DOB: 1961-07-24  Age: 60 y.o. MRN#: [id number] Attending Physician: Caren Griffins, MD Primary Care Physician: [name], DO Admit Date: 07/30/2021  Reason for Consultation/Follow-up: Establishing goals of care  Patient Profile/HPI:  60 y.o. male  with past medical history of stage IIIa non-small cell lung cancer currently on chemotherapy, hyperlipidemia, diabetes type 2 admitted on 07/30/2021 with cc of fatigue, poor oral intake for 2 weeks prior to admission. On workup found to have AFib RVR, DKA, and loculated right pleural effusion as well as progression of lung cancer. He is s/p chest tube placement, on antibiotics. AFib resolved to NSR after cardizem gtt, AKI at baseline, WBC trending down from loculated effusion/infection (45 -> 19 today). Anemia and malnutrition likely secondary to malignancy, dietician has seen/made recommendations. He is deconditioned and PT/OT recommending HHC PT/OT.  Admission and recovery have been complicated by ongoing output from chest tube- he has an empyema and likely trapped lung.   Subjective: Patient has received 7 mg IV Dilaudid in the past 24 hours, he is awake alert sitting up in bed, wife at bedside. We talked about is now in stepdown unit, awake, just got pain medication, wife at bedside.    Re discussed code status and goals of care, see below.   ROS: dyspnea   Physical Exam Vitals and nursing note reviewed.  Constitutional:      Appearance: He is ill-appearing.  Cardiovascular:     Rate and Rhythm: Normal rate.  Pulmonary:     Effort: Pulmonary effort is normal.  Skin:    General: Skin is warm and dry.     Coloration: Skin is pale.  Neurological:      Motor: Weakness present.          Has chest tube  Vital Signs: BP 111/63   Pulse 80   Temp 98 F (36.7 C) (Oral)   Resp (!) 21   Ht 6' (1.829 m)   Wt 95.7 kg   SpO2 100%   BMI 28.62 kg/m  SpO2: SpO2: 100 % O2 Device: O2 Device: Nasal Cannula O2 Flow Rate: O2 Flow Rate (L/min): 3 L/min  Intake/output summary:  Intake/Output Summary (Last 24 hours) at 08/17/2021 1346 Last data filed at 08/17/2021  0600 Gross per 24 hour  Intake 518.75 ml  Output 100 ml  Net 418.75 ml    LBM: Last BM Date: 08/16/21 Baseline Weight: Weight: 95.4 kg Most recent weight: Weight: 95.7 kg       Palliative Assessment/Data: PPS: 50%      Patient Active Problem List   Diagnosis Date Noted   Pressure injury of skin 08/16/2021   Palliative care by specialist    Shortness of breath    Empyema of right pleural space ([place]) 08/04/2021   Malnutrition of moderate degree 07/31/2021   Atrial fibrillation with rapid ventricular response ([place]) 07/30/2021   Pleural effusion, right    Radiation-induced esophageal stricture    Cough 04/08/2021   Dysphagia    Gastroesophageal reflux disease with esophagitis without hemorrhage    Peptic stricture of esophagus    Hiatal hernia    Gastritis and gastroduodenitis    Insomnia 01/01/2021   Port-A-Cath in place 12/04/2020   Encounter for antineoplastic immunotherapy 06/04/2020   Malignant neoplasm of right upper lobe of lung ([place]) 03/13/2020   Encounter for antineoplastic chemotherapy 03/13/2020   Goals of care, counseling/discussion 03/13/2020   Lung mass 03/11/2020   Gastroesophageal reflux disease without esophagitis 02/15/2018   Type 2 diabetes mellitus without complication, without long-term current use of insulin ([place]) 07/07/2017   Hyperlipidemia 07/07/2017    Palliative Care Assessment & Plan    Assessment/Recommendations/Plan  Chart reviewed, patient seen and examined, discussed with patient and wife Ms. William Farrell who was present at the bedside: re  discussed about how this hospitalization is going and re addressed goals of care.  Patient and wife remain hopeful for stabilization, and then for home with home health care.   Continue to monitor.    Family meeting:  Discussed with patient and wife about his current condition and how his hospitalization is going. Goals wishes and values important to the patient and wife discussed. Code status discussed.  Pain medication needs discussed: Start MS Contin long-acting 20 mg every 12 hours based on patient's current opioid use. Discussed extensively with the patient and wife about next steps and what his goals and wishes are.  Offered medical recommendation for DO NOT RESUSCITATE, gave him some more information about hospice. Patient states " we are not there yet."  He really is focused on going home and states that he wishes to discuss about DNR as well as hospice once he is inside his own home.  PMT to continue to follow.   Code Status: Full code Full code for now, recommend outpatient palliative versus hospice.    Prognosis:  Guarded  Appears guarded. Discharge Planning: To Be Determined  Care plan was discussed with patient and spouse.   Thank you for allowing the Palliative Medicine Team to assist in the care of this patient.  Total time: 35 mins Prolonged billing:No      Greater than 50%  of this time was spent counseling and coordinating care related to the above assessment and plan.  Loistine Chance, MD  palliative Medicine   Please contact Palliative Medicine Team phone at [phone] for questions and concerns from 7 AM to 7 PM, after 7 PM, please contact primary service.

## 2021-08-17 NOTE — Progress Notes (Signed)
Chest tube clamped by Jerrye Bushy NP. RN to unclamp chest tube with signs of respiratory distress, increased work of breathing etc.

## 2021-08-17 NOTE — Progress Notes (Signed)
ANTICOAGULATION CONSULT NOTE - Initial Consult  Pharmacy Consult for Heparin >> Eliquis Indication: DVT  Allergies  Allergen Reactions   Sitagliptin Other (See Comments)    headache    Patient Measurements: Height: 6' (182.9 cm) Weight: 95.7 kg (211 lb) IBW/kg (Calculated) : 77.6 Heparin Dosing Weight: 96 kg  Vital Signs: Temp: 98.1 F (36.7 C) (10/03 0100) Temp Source: Oral (10/03 0100) BP: 110/61 (10/03 0600) Pulse Rate: 77 (10/03 0600)  Labs: Recent Labs    08/15/21 0515 08/16/21 0300 08/17/21 0015 08/17/21 0108  HGB 8.4* 7.7*  --  7.9*  HCT 28.9* 25.9*  --  26.2*  PLT 483* 437*  --  479*  HEPARINUNFRC  --   --  0.34  --   CREATININE 1.02 1.05  --  0.85     Estimated Creatinine Clearance: 110.8 mL/min (by C-G formula based on SCr of 0.85 mg/dL).   Medical History: Past Medical History:  Diagnosis Date   Asthma    as a child   Diabetes ([place])    Diverticulitis    GERD (gastroesophageal reflux disease)    High cholesterol    History of kidney stones    Lung cancer ([facility])    Wears glasses    Assessment: 60 y/o M with NSCLC on chemo admitted with sepsis due to loculated pleural effusion and empyema. Dopplers positive for left LE swelling revealed DVT. Also with multiple episodes of atrial fibrillation since admission.  CHADS-VASc =1.  Pharmacy consulted to transition heparin drip to Eliquis for DVT treatment.  08/17/21 Hgb = 7.9 (low-stable); Pltc 341P No complications of therapy noted by RN  Goal of Therapy:  Monitor platelets by anticoagulation protocol: Yes   Plan:  Discontinue heparin drip Initiate Eliquis 10mg  PO BID x 7 days, followed by 5mg  PO BID  First dose of Eliquis to be administered at time of heparin drip discontinuation- d/w RN  Dimple Nanas, PharmD 08/17/2021,8:25 AM

## 2021-08-18 ENCOUNTER — Inpatient Hospital Stay (HOSPITAL_COMMUNITY): Payer: BC Managed Care – PPO

## 2021-08-18 ENCOUNTER — Telehealth: Payer: Self-pay | Admitting: *Deleted

## 2021-08-18 DIAGNOSIS — I4891 Unspecified atrial fibrillation: Secondary | ICD-10-CM | POA: Diagnosis not present

## 2021-08-18 DIAGNOSIS — J869 Pyothorax without fistula: Secondary | ICD-10-CM | POA: Diagnosis not present

## 2021-08-18 DIAGNOSIS — Z9689 Presence of other specified functional implants: Secondary | ICD-10-CM | POA: Diagnosis not present

## 2021-08-18 LAB — CBC
HCT: 28.6 % — ABNORMAL LOW (ref 39.0–52.0)
Hemoglobin: 8.5 g/dL — ABNORMAL LOW (ref 13.0–17.0)
MCH: 27.7 pg (ref 26.0–34.0)
MCHC: 29.7 g/dL — ABNORMAL LOW (ref 30.0–36.0)
MCV: 93.2 fL (ref 80.0–100.0)
Platelets: 497 10*3/uL — ABNORMAL HIGH (ref 150–400)
RBC: 3.07 MIL/uL — ABNORMAL LOW (ref 4.22–5.81)
RDW: 19.9 % — ABNORMAL HIGH (ref 11.5–15.5)
WBC: 15.1 10*3/uL — ABNORMAL HIGH (ref 4.0–10.5)
nRBC: 0.1 % (ref 0.0–0.2)

## 2021-08-18 LAB — LACTIC ACID, PLASMA: Lactic Acid, Venous: 1.5 mmol/L (ref 0.5–1.9)

## 2021-08-18 LAB — BASIC METABOLIC PANEL
Anion gap: 7 (ref 5–15)
BUN: 13 mg/dL (ref 6–20)
CO2: 27 mmol/L (ref 22–32)
Calcium: 9.1 mg/dL (ref 8.9–10.3)
Chloride: 108 mmol/L (ref 98–111)
Creatinine, Ser: 0.75 mg/dL (ref 0.61–1.24)
GFR, Estimated: >60 mL/min (ref >60)
Glucose, Bld: 134 mg/dL — ABNORMAL HIGH (ref 70–99)
Potassium: 4.7 mmol/L (ref 3.5–5.1)
Sodium: 142 mmol/L (ref 135–145)

## 2021-08-18 LAB — PHOSPHORUS: Phosphorus: 3.4 mg/dL (ref 2.5–4.6)

## 2021-08-18 LAB — GLUCOSE, CAPILLARY
Glucose-Capillary: 139 mg/dL — ABNORMAL HIGH (ref 70–99)
Glucose-Capillary: 171 mg/dL — ABNORMAL HIGH (ref 70–99)
Glucose-Capillary: 91 mg/dL (ref 70–99)

## 2021-08-18 LAB — MAGNESIUM: Magnesium: 2.2 mg/dL (ref 1.7–2.4)

## 2021-08-18 MED ORDER — MORPHINE SULFATE ER 30 MG PO TBCR: 30.0000 mg | EXTENDED_RELEASE_TABLET | Freq: Two times a day (BID) | ORAL | 0 refills | Status: AC

## 2021-08-18 MED ORDER — AMOXICILLIN-POT CLAVULANATE 875-125 MG PO TABS: 1.0000 | ORAL_TABLET | Freq: Two times a day (BID) | ORAL | 0 refills | Status: AC

## 2021-08-18 MED ORDER — HEPARIN SOD (PORK) LOCK FLUSH 100 UNIT/ML IV SOLN
500.0000 [IU] | INTRAVENOUS | Status: AC | PRN
  Administered 2021-08-18: 500 [IU]
  Filled 2021-08-18: qty 5

## 2021-08-18 MED ORDER — METOPROLOL TARTRATE 25 MG PO TABS: 25.0000 mg | ORAL_TABLET | Freq: Two times a day (BID) | ORAL | 0 refills | Status: DC

## 2021-08-18 MED ORDER — APIXABAN 5 MG PO TABS: 5.0000 mg | ORAL_TABLET | Freq: Two times a day (BID) | ORAL | 0 refills | Status: DC

## 2021-08-18 MED ORDER — APIXABAN 5 MG PO TABS: 10.0000 mg | ORAL_TABLET | Freq: Two times a day (BID) | ORAL | 0 refills | Status: AC

## 2021-08-18 MED ORDER — OXYCODONE-ACETAMINOPHEN 5-325 MG PO TABS: 1.0000 | ORAL_TABLET | Freq: Three times a day (TID) | ORAL | 0 refills | Status: AC | PRN

## 2021-08-18 NOTE — Progress Notes (Signed)
AVS discharge instructions reviewed with patient and patient's wife per orders. All questions answered at this time. Education and care plan completed. All belongings returned to patient. PIV removed and port deaccessed by IV team RN per orders. Pt was transferred to his vehicle in the wheelchair by RN where his wife was waiting to drive him home.

## 2021-08-18 NOTE — Progress Notes (Signed)
SATURATION QUALIFICATIONS: (This note is used to comply with regulatory documentation for home oxygen)  Patient Saturations on Room Air at Rest = 91%  Patient Saturations on Room Air while Ambulating = 85%  Patient Saturations on 2 Liters of oxygen while Ambulating = 95%  Please briefly explain why patient needs home oxygen:to maintain saturation while ambulating and moving around. [place] Pager [phone] Office [phone]

## 2021-08-18 NOTE — Progress Notes (Signed)
Right posterior chest tube removed per order. Upon removal significant drainage noted from site. Critical care NP made aware and stated this is an expected finding for this particular patient. No complications during chest tube removal. Per critical care NP, no cxr needed post removal.

## 2021-08-18 NOTE — Discharge Instructions (Addendum)
Please take Eliquis 10 mg twice daily for 5 more days, then on Monday 08/24/2021 take 5 mg twice daily   Information on my medicine - ELIQUIS (apixaban)  This medication education was reviewed with me or my healthcare representative as part of my discharge preparation.    Why was Eliquis prescribed for you? Eliquis was prescribed to treat blood clots that may have been found in the veins of your legs (deep vein thrombosis) or in your lungs (pulmonary embolism) and to reduce the risk of them occurring again.  What do You need to know about Eliquis ? The starting dose is 10 mg (two 5 mg tablets) taken TWICE daily for the FIRST SEVEN (7) DAYS, then on (enter date)  08/24/21  the dose is reduced to ONE 5 mg tablet taken TWICE daily.  Eliquis may be taken with or without food.   Try to take the dose about the same time in the morning and in the evening. If you have difficulty swallowing the tablet whole please discuss with your pharmacist how to take the medication safely.  Take Eliquis exactly as prescribed and DO NOT stop taking Eliquis without talking to the doctor who prescribed the medication.  Stopping may increase your risk of developing a new blood clot.  Refill your prescription before you run out.  After discharge, you should have regular check-up appointments with your healthcare provider that is prescribing your Eliquis.    What do you do if you miss a dose? If a dose of ELIQUIS is not taken at the scheduled time, take it as soon as possible on the same day and twice-daily administration should be resumed. The dose should not be doubled to make up for a missed dose.  Important Safety Information A possible side effect of Eliquis is bleeding. You should call your healthcare provider right away if you experience any of the following: Bleeding from an injury or your nose that does not stop. Unusual colored urine (red or dark brown) or unusual colored stools (red or  black). Unusual bruising for unknown reasons. A serious fall or if you hit your head (even if there is no bleeding).  Some medicines may interact with Eliquis and might increase your risk of bleeding or clotting while on Eliquis. To help avoid this, consult your healthcare provider or pharmacist prior to using any new prescription or non-prescription medications, including herbals, vitamins, non-steroidal anti-inflammatory drugs (NSAIDs) and supplements.  This website has more information on Eliquis (apixaban): http://[email or web address].com/eliquis/home

## 2021-08-18 NOTE — Discharge Summary (Signed)
Physician Discharge Summary  William Farrell VHQ:[id number] DOB: Feb 16, 1961 DOA: 07/30/2021  PCP: [name], DO  Admit date: 07/30/2021 Discharge date: 08/18/2021  Admitted From: home Disposition:  home  Recommendations for Outpatient Follow-up:  Follow up with PCP in 1-2 weeks Follow up with Dr Julien Nordmann in 1-2 weeks  Home Health: PT, OT Equipment/Devices: none  Discharge Condition: stable CODE STATUS: Full code Diet recommendation: regular  HPI: Per admitting MD, William Farrell is a 60 y.o. male with medical history significant of non-small cell lung cancer on chemo, HLD, DM2. Presenting with fatigue and poor PO intake. For the last 2 weeks, he has noticed that he has become increasingly fatigue. He has no energy to do anything. His oral intake has been poor in part d/t painful swallowing. He says he feels as if he throat is sore. He went to see his onco doc today to get a chemo infusion, but he was found to be too weak. It was recommended that he come to the ED for evaluation.  Hospital Course / Discharge diagnoses: Principal Problem Sepsis secondary to loculated right-sided pleural effusion, empyema-patient presented to the hospital with shortness of breath, and due to the loculated right-sided pleural effusion pulmonology was consulted and he is status post chest tube placement on 9/15.  There was concern for infection with elevated procalcitonin and initially was placed on broad-spectrum antibiotics.  Pleural fluid culture showed initially Streptococcus intermedius, pansensitive.  He was transitioned to ceftriaxone.  Due to lack of response, persistent leukocytosis and persistent drainage from the chest tube, his fluid was cultured again and received tPA/DNase x3 days, and now also showing Streptococcus constellatus, Streptococcus anginosus as well as Prevotella Denticola, beta-lactamase positive. His chest tube eventually stopped draining and was successfully removed.  Currently on Unasyn, case was discussed with ID and will be transitioned to Augmentin on dc. D/w pulmonary, will do a month of antibiotics. Will need outpatient follow up for repeat imaging and labs before he finishes the antibiotics.    Active Problems A. fib with RVR -patient has had 3 episodes since admission, recently requiring amiodarone to convert back to sinus rhythm. Has remained in sinus off amiodarone, controlled on Metoprolol BID which will be prescribed on dc as well. TSH normal.  Echocardiogram showed EF of 60 to 65%, normal left ventricular diastolic parameters.  On Eliquis for anticoagulation Acute DVT-in the left gastrocnemius and left peroneal veins.  He was started on heparin, tolerated that well, transitioned to Eliquis DKA/diabetes type 2 -poorly controlled CBGs on admission likely due to sepsis / infectious process. Initially started on insulin drip, then transitioned to subcutaneous insulin. Hemoglobin A1c of 6.6.  Sugars were rrunning low so Lantus discontinued.  Resume home regimen on dc AKI on CKD stage IIIa -Creatinine was in the range of 2 on presentation.  Baseline creatinine around 1.3.  Currently at baseline Chronic normocytic anemia -Baseline hemoglobin around 8.   No evidence of acute blood loss. Continue iron Stage III non-small cell lung cancer -Follows with Dr. Julien Nordmann. Pleural fluid cytology negative for malignancy. Hyperlipidemia-On statin Hypothyroidism-On Synthyroid Moderate malnutrition-Secondary to malignancy.  Debility/deconditioning-PT/OT evaluated the patient and recommend home health on discharge.  Discharge Instructions   Allergies as of 08/18/2021       Reactions   Sitagliptin Other (See Comments)   headache        Medication List     STOP taking these medications    aspirin EC 81 MG tablet  TAKE these medications    acetaminophen 500 MG tablet Commonly known as: TYLENOL Take 1,000 mg by mouth every 6 (six) hours as needed for  mild pain.   albuterol 108 (90 Base) MCG/ACT inhaler Commonly known as: VENTOLIN HFA Inhale 1-2 puffs into the lungs every 4 (four) hours as needed for wheezing or shortness of breath.   albuterol (2.5 MG/3ML) 0.083% nebulizer solution Commonly known as: PROVENTIL Take 3 mLs (2.5 mg total) by nebulization every 4 (four) hours as needed for wheezing or shortness of breath (EVERY 4 HOURS AND PRN).   amoxicillin-clavulanate 875-125 MG tablet Commonly known as: Augmentin Take 1 tablet by mouth every 12 (twelve) hours for 28 days.   apixaban 5 MG Tabs tablet Commonly known as: ELIQUIS Take 2 tablets (10 mg total) by mouth 2 (two) times daily for 5 days.   apixaban 5 MG Tabs tablet Commonly known as: ELIQUIS Take 1 tablet (5 mg total) by mouth 2 (two) times daily. Start taking on: August 24, 2021   atorvastatin 40 MG tablet Commonly known as: LIPITOR TAKE 1 TABLET DAILY What changed: when to take this   benzonatate 200 MG capsule Commonly known as: TESSALON TAKE 1 CAPSULE (200 MG TOTAL) BY MOUTH 3 (THREE) TIMES DAILY AS NEEDED FOR COUGH.   Breztri Aerosphere 160-9-4.8 MCG/ACT Aero Generic drug: Budeson-Glycopyrrol-Formoterol Inhale 2 puffs into the lungs in the morning and at bedtime.   dronabinol 5 MG capsule Commonly known as: MARINOL Take 1 capsule by mouth 2  times daily before lunch and supper.   Ferrous Fumarate-Vitamin C ER 65-25 MG Tbcr Take 65 mg by mouth daily.   glipiZIDE 5 MG tablet Commonly known as: GLUCOTROL TAKE 1 TABLET TWICE A DAY BEFORE MEALS What changed: See the new instructions.   levothyroxine 75 MCG tablet Commonly known as: SYNTHROID TAKE 1 TABLET BY MOUTH EVERY DAY BEFORE BREAKFAST What changed:  how much to take how to take this when to take this additional instructions   lidocaine-prilocaine cream Commonly known as: EMLA Apply 1 application topically as needed. What changed: reasons to take this   loratadine 10 MG tablet Commonly  known as: CLARITIN Take 10 mg by mouth daily as needed for allergies (after chemo shot Fri, sat. sun.).   meloxicam 15 MG tablet Commonly known as: MOBIC TAKE 1 TABLET DAILY   metFORMIN 1000 MG tablet Commonly known as: GLUCOPHAGE TAKE 1 TABLET TWICE A DAY WITH MEALS   metoprolol tartrate 25 MG tablet Commonly known as: LOPRESSOR Take 1 tablet (25 mg total) by mouth 2 (two) times daily.   morphine 30 MG 12 hr tablet Commonly known as: MS CONTIN Take 1 tablet (30 mg total) by mouth every 12 (twelve) hours for 7 days.   ondansetron 8 MG tablet Commonly known as: ZOFRAN Take 1 tablet (8 mg total) by mouth every 8 (eight) hours as needed for nausea or vomiting.   OneTouch Delica Plus [id number] Misc USE AS DIRECTED UP TO FOUR TIMES A DAY   OneTouch Ultra test strip Generic drug: glucose blood USE AS INSTRUCTED UP TO FOUR TIMES A DAY   oxyCODONE-acetaminophen 5-325 MG tablet Commonly known as: PERCOCET/ROXICET Take 1 tablet by mouth every 8 (eight) hours as needed for up to 7 days for severe pain.   Ozempic (0.25 or 0.5 MG/DOSE) 2 MG/1.5ML Sopn Generic drug: Semaglutide(0.25 or 0.5MG /DOS) Inject 0.5 mg into the skin once a week.   pantoprazole 40 MG tablet Commonly known as: Protonix Take 1 tablet (40  mg total) by mouth daily.   pilocarpine 5 MG tablet Commonly known as: SALAGEN Take 1 tablet (5 mg total) by mouth 3 (three) times daily.   Stiolto Respimat 2.5-2.5 MCG/ACT Aers Generic drug: Tiotropium Bromide-Olodaterol Inhale 2 puffs into the lungs daily.   temazepam 15 MG capsule Commonly known as: RESTORIL Take 1 capsule (15 mg total) by mouth at bedtime as needed for sleep.               Durable Medical Equipment  (From admission, onward)           Start     Ordered   08/12/21 1108  For home use only DME Walker rolling  Once       Question Answer Comment  Walker: With [place] Wheels   Patient needs a walker to treat with the following condition ILD  (interstitial lung disease) ([place])      08/12/21 1107   08/12/21 1108  For home use only DME Hospital bed  Once       Question Answer Comment  Length of Need Lifetime   The above medical condition requires: Patient requires the ability to reposition frequently   Bed type Semi-electric      08/12/21 1107            Follow-up Information     [name], DO Follow up in 1 week(s).   Specialty: Osteopathic Medicine Contact information: [address] [place] [address] [phone]         Curt Bears, MD Follow up in 1 week(s).   Specialty: Oncology Contact information: [place] [address] [phone]                 Consultations: Oncology Palliative care Pulmonary   Procedures/Studies:  DG Chest 1 View  Result Date: 08/10/2021 CLINICAL DATA:  Stage III A non-small cell lung cancer on chemotherapy, hyperlipidemia, type II diabetes mellitus, atrial fibrillation, presents with fatigue and poor oral intake for 2 weeks EXAM: CHEST  1 VIEW COMPARISON:  Portable exam 0830 hours compared to 08/06/2021 FINDINGS: LEFT jugular Port-A-Cath with tip projecting over cavoatrial junction. RIGHT thoracostomy tube unchanged. Stable heart size and mediastinal contours. Bibasilar effusions and atelectasis. Large area of opacity in the upper LEFT lung corresponding to known tumor and accompanying atelectasis. Small loculated RIGHT apex pneumothorax despite thoracostomy tube, unchanged. IMPRESSION: No interval change in small bibasilar pleural effusions and bibasilar atelectasis, RIGHT thoracostomy tube with persistent small RIGHT apex pneumothorax, and known RIGHT perihilar/RIGHT upper lobe neoplasm. Electronically Signed   By: Lavonia Dana M.D.   On: 08/10/2021 08:55   CT CHEST WO CONTRAST  Result Date: 08/12/2021 CLINICAL DATA:  Empyema, lung cancer. EXAM: CT CHEST WITHOUT CONTRAST TECHNIQUE: Multidetector CT imaging of the  chest was performed following the standard protocol without IV contrast. COMPARISON:  08/02/2021. FINDINGS: Cardiovascular: Left IJ Port-A-Cath terminates at the SVC RA junction. Atherosclerotic calcification of the aorta. Pulmonic trunk is enlarged. Heart is at the upper limits of normal in size. Trace pericardial effusion. Mediastinum/Nodes: Low right internal jugular adenopathy measures up to approximately 2.2 cm (2/22). Deep right subpectoral lymph node measures 1.6 cm (2/36). Somewhat ill-defined low right paratracheal nodal tissue is difficult to accurately measure given lack of IV contrast. Approximate measurement is 1.9 cm, similar. Hilar regions are difficult to evaluate without IV contrast. Prepericardiac/juxtadiaphragmatic lymph nodes measure up to 9 mm (2/117), similar. No axillary adenopathy. Esophagus is grossly unremarkable. Lungs/Pleura: Cystic  lung destruction in the apical segment right upper lobe. Masslike consolidation in the right upper lobe extending to the right hilum, right middle lobe and right lower lobes with narrowing of the right lung bronchi, similar to 08/02/2021. Septal thickening in the right lung. 6 mm nodule in the superior segment right lower lobe (5/65), unchanged. Moderate right hydropneumothorax with a small bore chest tube in the medial aspect of the lower right hemithorax. Left upper lobe nodules measure up to 6 mm (5/52), similar. Small left pleural effusion, increased. Debris is seen in the airway. Upper Abdomen: Visualized portions of the liver, gallbladder, adrenal glands, kidneys, spleen, pancreas, stomach and bowel are grossly unremarkable. No upper abdominal adenopathy. Trace perihepatic ascites. Musculoskeletal: Degenerative changes in the spine. No worrisome lytic or sclerotic lesions. IMPRESSION: 1. Moderate right hydropneumothorax with a small bore chest tube in the medial base of the right hemithorax. 2. Masslike consolidation and post treatment changes in the  apical and medial right lung with pulmonary metastases bilaterally. Nodal metastatic disease extends to the right internal jugular station and right subpectoral region. 3. Septal thickening in the right lung can be seen with lymphedema or possibly lymphangitic carcinomatosis. 4. Trace pericardial effusion. 5. Small left pleural effusion, increased. 6. Trace perihepatic ascites. 7.  Aortic atherosclerosis (ICD10-I70.0). 8. Enlarged pulmonic trunk, indicative of pulmonary arterial hypertension. Electronically Signed   By: Lorin Picket M.D.   On: 08/12/2021 13:50   CT CHEST W CONTRAST  Result Date: 08/02/2021 CLINICAL DATA:  Empyema, chest tube placement, shortness of breath, history of lung cancer EXAM: CT CHEST WITH CONTRAST TECHNIQUE: Multidetector CT imaging of the chest was performed during intravenous contrast administration. CONTRAST:  19mL OMNIPAQUE IOHEXOL 350 MG/ML SOLN COMPARISON:  06/08/2021 FINDINGS: Cardiovascular: Left chest port catheter. Normal heart size. No pericardial effusion. Mediastinum/Nodes: No significant change in extensive soft tissue about the right hilum with matted appearing pretracheal lymph nodes (series 2, image 59, 49). Thyroid gland, trachea, and esophagus demonstrate no significant findings. Lungs/Pleura: There has been interval placement of a pigtail chest tube about the posterior, inferior right lung base (series 7, image 75). There is extensive, diffuse pleural thickening and adjacent interlobular septal thickening with a small, loculated hydropneumothorax, approximately 10% volume. Similar post treatment appearance of underlying perihilar and suprahilar right lung malignancy with adjacent satellite nodularity. Multiple small pulmonary nodules throughout the lungs are new and enlarged, particularly in the left lung, for example a nodule of the anterior left upper lobe measuring 0.7 cm previously no greater than 0.3 cm (series 5, image 47). New, trace left pleural effusion.  Upper Abdomen: No acute abnormality. Small volume perihepatic ascites. Musculoskeletal: No chest wall mass or suspicious bone lesions identified. IMPRESSION: 1. There has been interval placement of a pigtail chest tube about the posterior, inferior right lung base. 2. There is extensive, diffuse pleural thickening and adjacent interlobular septal thickening with a small, loculated hydropneumothorax, approximately 10% volume. This trapped lung appearance may be secondary to pleural metastatic disease and or empyema. 3. New, trace left pleural effusion. 4. Similar post treatment appearance of underlying perihilar and suprahilar right lung malignancy with adjacent satellite nodularity. 5. Multiple small pulmonary nodules throughout the lungs are new and enlarged, consistent with worsening pulmonary metastatic disease. 6. Small volume perihepatic ascites. Electronically Signed   By: Eddie Candle M.D.   On: 08/02/2021 13:44   DG CHEST PORT 1 VIEW  Result Date: 08/18/2021 CLINICAL DATA:  Chest tube in place. EXAM: PORTABLE CHEST 1 VIEW COMPARISON:  08/17/2021 FINDINGS: A left jugular Port-A-Cath and right chest tube are unchanged. A loculated right hydropneumothorax has not significantly changed. Masslike opacity in the right upper lobe is unchanged. Airspace opacity in the right lung base has mildly increased. A small left pleural effusion and mild left basilar opacity/atelectasis are unchanged. IMPRESSION: 1. Unchanged loculated right hydropneumothorax and right upper lobe masslike opacity. 2. Mildly worsened right basilar aeration. Electronically Signed   By: Logan Bores M.D.   On: 08/18/2021 10:21   DG CHEST PORT 1 VIEW  Result Date: 08/17/2021 CLINICAL DATA:  Evaluate chest tube placement EXAM: PORTABLE CHEST 1 VIEW COMPARISON:  08/15/2021 FINDINGS: There is a right basilar chest tube in place the pigtail of which is unchanged in position from previous exam. Loculated right hydropneumothorax is again  identified. Allowing for differences in technique there has been no significant change in size from previous exam. Right upper lobe lung masslike opacity is also unchanged. There is a small left pleural effusion, similar to the previous study. IMPRESSION: 1. Stable position of right chest tube. 2. No significant change in loculated right hydropneumothorax. 3. Stable left pleural effusion. 4. Stable right upper lobe lung masslike opacity. Electronically Signed   By: Kerby Moors M.D.   On: 08/17/2021 11:12   DG CHEST PORT 1 VIEW  Result Date: 08/15/2021 CLINICAL DATA:  Empyema.  Known right upper lobe lung cancer. EXAM: PORTABLE CHEST 1 VIEW COMPARISON:  August 14, 2021 FINDINGS: Masslike opacity in the right upper lobe remains, similar in the interval. The right apical pneumothorax remains small but is larger in the interval. The right chest tube is stable. Right pleural fluid is stable. A small left effusion is stable. No change in the cardiomediastinal silhouette. No change in the left Port-A-Cath. IMPRESSION: 1. The right apical pneumothorax remains small but is larger in the interval with a stable right chest tube. 2. Masslike opacity in the right upper lobe is stable. 3. Small bilateral pleural effusions. Electronically Signed   By: Dorise Bullion III M.D.   On: 08/15/2021 08:26   DG CHEST PORT 1 VIEW  Result Date: 08/14/2021 CLINICAL DATA:  One hundred pneumothorax EXAM: PORTABLE CHEST 1 VIEW COMPARISON:  08/10/2021 FINDINGS: Right basilar chest tube remains in place, unchanged. Small right pleural effusion. Right apical pneumothorax again noted, not significantly changed. Masslike opacity in the right upper lobe with airspace disease in the right mid and lower lung, stable. No confluent opacity on the left. Possible small left effusion. IMPRESSION: Right basilar chest tube remains in place with small right effusion and right apical pneumothorax, not significantly changed. Right upper lobe  masslike opacity again noted with areas of right mid and lower lung airspace opacity, unchanged. Electronically Signed   By: Rolm Baptise M.D.   On: 08/14/2021 07:15   DG CHEST PORT 1 VIEW  Result Date: 08/10/2021 CLINICAL DATA:  Chest tube follow-up empyema EXAM: PORTABLE CHEST 1 VIEW COMPARISON:  08/10/2021, CT 08/02/2021, chest x-ray 08/06/2021 FINDINGS: Left-sided central venous port tip over the cavoatrial region. Cardiomegaly. Probable small left effusion. Right-sided chest tube similar in position at the right base. Loculated pneumothorax at the right apex without significant change. Slight increased opacity at the right mid lung could reflect increased fluid at the pulmonary fissure. Dense right apical opacity as before. IMPRESSION: 1. Overall no great change in appearance of the chest as compared with radiograph earlier today. Residual right pleural effusion and loculated pneumothorax at the right apex. Right midlung opacity is increased compared  from 09/22 and may represent increased fluid at pulmonary fissure. 2. Cardiomegaly and small left-sided effusion. Aeration at left lung base is slightly improved. Electronically Signed   By: Donavan Foil M.D.   On: 08/10/2021 15:59   DG CHEST PORT 1 VIEW  Result Date: 08/06/2021 CLINICAL DATA:  Chest tube in place EXAM: PORTABLE CHEST 1 VIEW COMPARISON:  Chest x-ray dated July 31, 2021 FINDINGS: Increased size of small right pneumothorax. Right-sided chest tube in place. Unchanged left chest wall port. Similar heterogeneous opacities at the right lung. No new parenchymal process. No evidence of pleural effusion. Cardiac and mediastinal contours are unchanged. IMPRESSION: Increased size of small right pneumothorax. Right-sided chest tube in place. Electronically Signed   By: Yetta Glassman M.D.   On: 08/06/2021 11:25   DG Chest Port 1 View  Result Date: 07/31/2021 CLINICAL DATA:  Pleural effusion, chest tube placement. EXAM: PORTABLE CHEST 1 VIEW  COMPARISON:  July 30, 2021. FINDINGS: LEFT-sided central venous access device terminates at the caval cavoatrial junction, port projecting over the LEFT chest. RIGHT sided chest tube is in place. In similar position. Tube along the medial RIGHT chest entering via RIGHT lateral chest. RIGHT hemidiaphragm remains elevated. EKG leads project over the chest. Cardiomediastinal contours and hilar structures are stable. Known mass in the RIGHT upper lobe and associated volume loss unchanged from the recent radiograph from July 30, 2021. Small apical pneumothorax is similar to the prior study. Lungs are otherwise clear. Small effusion likely along the RIGHT lung apex. On limited assessment there is no acute skeletal process. IMPRESSION: Stable chest x-ray. Known RIGHT upper lobe mass, volume loss and small RIGHT apical pneumothorax with RIGHT chest tube in place. Electronically Signed   By: Zetta Bills M.D.   On: 07/31/2021 08:06   DG Chest Port 1 View  Result Date: 07/30/2021 CLINICAL DATA:  Chest tube EXAM: PORTABLE CHEST 1 VIEW COMPARISON:  07/30/2021, CT 06/08/2021 FINDINGS: Left-sided central venous port tip over the cavoatrial region. Elevated right diaphragm. Interim placement of right lower chest drainage catheter with decreased right pleural effusion. At least small residual right pleural effusion. Right hilar and suprahilar opacity likely corresponds to CT demonstrated mass. Suspicion of right apical pneumothorax, possibly loculated. No midline shift. IMPRESSION: 1. Interim placement of right lower chest tube with decreased right pleural effusion. At least small residual right pleural collection, with suspicion of right apical small pneumothorax, possibly loculated 2. Right hilar and suprahilar opacity, corresponding to known lung mass. Electronically Signed   By: Donavan Foil M.D.   On: 07/30/2021 19:09   DG Chest Port 1 View  Result Date: 07/30/2021 CLINICAL DATA:  Shortness of breath EXAM:  PORTABLE CHEST 1 VIEW COMPARISON:  Chest radiograph 04/08/2021, CT chest 06/08/2021 FINDINGS: There is a left chest wall port in place with the tip terminating in the region of the superior cavoatrial junction. The cardiomediastinal silhouette is grossly stable. Compared to the prior radiograph from 04/08/2021, there is significantly worsened opacity in the right upper lobe. Right apical pleural capping has also worsened. There is increased asymmetric elevation of the right hemidiaphragm. The left lung is clear. There is no acute osseous abnormality. IMPRESSION: 1. Compared to the prior radiograph from 04/08/2021, aeration of the right upper lobe has significantly worsened consistent with interval growth of the right upper lobe mass. Apical pleural capping has also increased, raising suspicion for a growing loculated pleural effusion. 2. Increased asymmetric elevation of the right hemidiaphragm raises suspicion for bronchial obstruction with  associated volume loss. Electronically Signed   By: Valetta Mole M.D.   On: 07/30/2021 12:13   ECHOCARDIOGRAM COMPLETE  Result Date: 07/31/2021    ECHOCARDIOGRAM REPORT   Patient Name:   KYO COCUZZA Date of Exam: 07/31/2021 Medical Rec #:  [id number]          Height:       72.0 in Accession #:    [id number]         Weight:       196.0 lb Date of Birth:  09/12/1961         BSA:          2.112 m Patient Age:    41 years           BP:           125/65 mmHg Patient Gender: M                  HR:           85 bpm. Exam Location:  Inpatient Procedure: 2D Echo, Cardiac Doppler and Color Doppler Indications:    I42.9 Cardiomyopathy (unspecified)  History:        Patient has no prior history of Echocardiogram examinations.                 Arrythmias:Atrial Fibrillation; Risk Factors:Diabetes.  Sonographer:    MH Referring Phys: [id number] ANKIT CHIRAG AMIN IMPRESSIONS  1. Left ventricular ejection fraction, by estimation, is 60 to 65%. The left ventricle has normal function. The  left ventricle has no regional wall motion abnormalities. There is mild concentric left ventricular hypertrophy. Left ventricular diastolic parameters were normal.  2. Right ventricular systolic function is normal. The right ventricular size is normal. Tricuspid regurgitation signal is inadequate for assessing PA pressure.  3. The mitral valve is normal in structure. No evidence of mitral valve regurgitation. No evidence of mitral stenosis.  4. The aortic valve is tricuspid. Aortic valve regurgitation is not visualized. No aortic stenosis is present.  5. The inferior vena cava is dilated in size with >50% respiratory variability, suggesting right atrial pressure of 8 mmHg. Comparison(s): No prior Echocardiogram. FINDINGS  Left Ventricle: Left ventricular ejection fraction, by estimation, is 60 to 65%. The left ventricle has normal function. The left ventricle has no regional wall motion abnormalities. The left ventricular internal cavity size was small. There is mild concentric left ventricular hypertrophy. Left ventricular diastolic parameters were normal. Right Ventricle: The right ventricular size is normal. No increase in right ventricular wall thickness. Right ventricular systolic function is normal. Tricuspid regurgitation signal is inadequate for assessing PA pressure. Left Atrium: Left atrial size was normal in size. Right Atrium: Right atrial size was normal in size. Pericardium: There is no evidence of pericardial effusion. Mitral Valve: The mitral valve is normal in structure. No evidence of mitral valve regurgitation. No evidence of mitral valve stenosis. Tricuspid Valve: The tricuspid valve is normal in structure. Tricuspid valve regurgitation is not demonstrated. No evidence of tricuspid stenosis. Aortic Valve: The aortic valve is tricuspid. Aortic valve regurgitation is not visualized. No aortic stenosis is present. Aortic valve mean gradient measures 4.0 mmHg. Aortic valve peak gradient measures 6.2  mmHg. Aortic valve area, by VTI measures 3.44 cm. Pulmonic Valve: The pulmonic valve was not well visualized. Pulmonic valve regurgitation is not visualized. No evidence of pulmonic stenosis. Aorta: The aortic root and ascending aorta are structurally normal, with no evidence of dilitation. Venous: The inferior vena cava  is dilated in size with greater than 50% respiratory variability, suggesting right atrial pressure of 8 mmHg. IAS/Shunts: The atrial septum is grossly normal.  LEFT VENTRICLE PLAX 2D LVIDd:         3.70 cm     Diastology LVIDs:         2.40 cm     LV e' medial:    9.14 cm/s LV PW:         1.40 cm     LV E/e' medial:  9.0 LV IVS:        1.50 cm     LV e' lateral:   12.80 cm/s LVOT diam:     2.40 cm     LV E/e' lateral: 6.5 LV SV:         88 LV SV Index:   42 LVOT Area:     4.52 cm  LV Volumes (MOD) LV vol d, MOD A4C: 46.1 ml LV vol s, MOD A4C: 16.7 ml LV SV MOD A4C:     46.1 ml RIGHT VENTRICLE             IVC RV S prime:     13.10 cm/s  IVC diam: 2.20 cm TAPSE (M-mode): 2.1 cm LEFT ATRIUM           Index LA diam:      2.70 cm 1.28 cm/m LA Vol (A2C): 13.4 ml 6.34 ml/m LA Vol (A4C): 21.7 ml 10.27 ml/m  AORTIC VALVE                   PULMONIC VALVE AV Area (Vmax):    3.53 cm    PV Vmax:       0.73 m/s AV Area (Vmean):   3.27 cm    PV Peak grad:  2.1 mmHg AV Area (VTI):     3.44 cm AV Vmax:           125.00 cm/s AV Vmean:          99.600 cm/s AV VTI:            0.255 m AV Peak Grad:      6.2 mmHg AV Mean Grad:      4.0 mmHg LVOT Vmax:         97.50 cm/s LVOT Vmean:        71.900 cm/s LVOT VTI:          0.194 m LVOT/AV VTI ratio: 0.76  AORTA Ao Root diam: 3.80 cm Ao Asc diam:  3.30 cm MITRAL VALVE MV Area (PHT): 4.49 cm    SHUNTS MV E velocity: 82.60 cm/s  Systemic VTI:  0.19 m MV A velocity: 62.40 cm/s  Systemic Diam: 2.40 cm MV E/A ratio:  1.32 Rudean Haskell MD Electronically signed by Rudean Haskell MD Signature Date/Time: 07/31/2021/3:23:52 PM    Final    VAS [place] LOWER EXTREMITY  VENOUS (DVT)  Result Date: 08/16/2021  Lower Venous DVT Study Patient Name:  NATHIAN STENCIL  Date of Exam:   08/16/2021 Medical Rec #: [id number]           Accession #:    [id number] Date of Birth: 12-11-1960          Patient Gender: M Patient Age:   28 years Exam Location:  [facility] Procedure:      VAS [place] LOWER EXTREMITY VENOUS (DVT) Referring Phys: Marzetta Board --------------------------------------------------------------------------------  Indications: Swelling.  Risk Factors: CA patient on chemotherapy. Comparison Study: No previous exams  Performing Technologist: Rogelia Rohrer RVT, RDMS  Examination Guidelines: A complete evaluation includes B-mode imaging, spectral Doppler, color Doppler, and power Doppler as needed of all accessible portions of each vessel. Bilateral testing is considered an integral part of a complete examination. Limited examinations for reoccurring indications may be performed as noted. The reflux portion of the exam is performed with the patient in reverse Trendelenburg.  +---------+---------------+---------+-----------+----------+--------------+ RIGHT    CompressibilityPhasicitySpontaneityPropertiesThrombus Aging +---------+---------------+---------+-----------+----------+--------------+ CFV      Full           Yes      Yes                                 +---------+---------------+---------+-----------+----------+--------------+ SFJ      Full                                                        +---------+---------------+---------+-----------+----------+--------------+ FV Prox  Full           Yes      Yes                                 +---------+---------------+---------+-----------+----------+--------------+ FV Mid   Full           Yes      Yes                                 +---------+---------------+---------+-----------+----------+--------------+ FV DistalFull           Yes      Yes                                  +---------+---------------+---------+-----------+----------+--------------+ PFV      Full                                                        +---------+---------------+---------+-----------+----------+--------------+ POP      Full           Yes      Yes                                 +---------+---------------+---------+-----------+----------+--------------+ PTV      Full                                                        +---------+---------------+---------+-----------+----------+--------------+ PERO     Full                                                        +---------+---------------+---------+-----------+----------+--------------+   +---------+---------------+---------+-----------+----------+--------------+  LEFT     CompressibilityPhasicitySpontaneityPropertiesThrombus Aging +---------+---------------+---------+-----------+----------+--------------+ CFV      Full           Yes      Yes                                 +---------+---------------+---------+-----------+----------+--------------+ SFJ      Full                                                        +---------+---------------+---------+-----------+----------+--------------+ FV Prox  Full           Yes      Yes                                 +---------+---------------+---------+-----------+----------+--------------+ FV Mid   Full           Yes      Yes                                 +---------+---------------+---------+-----------+----------+--------------+ FV DistalFull           Yes      Yes                                 +---------+---------------+---------+-----------+----------+--------------+ PFV      Full                                                        +---------+---------------+---------+-----------+----------+--------------+ POP      Full           Yes      Yes                                  +---------+---------------+---------+-----------+----------+--------------+ PTV      Full                                                        +---------+---------------+---------+-----------+----------+--------------+ PERO     None           No       No                   Acute          +---------+---------------+---------+-----------+----------+--------------+ Gastroc  None           No       No                   Acute          +---------+---------------+---------+-----------+----------+--------------+     Summary: BILATERAL: - No evidence of superficial venous thrombosis in the lower extremities, bilaterally. -No evidence of popliteal cyst, bilaterally.  RIGHT: - There is no evidence of deep vein thrombosis in the lower extremity.  LEFT: - Findings consistent with acute deep vein thrombosis involving the left gastrocnemius veins, and left peroneal veins.  *See table(s) above for measurements and observations. Electronically signed by Harold Barban MD on 08/16/2021 at 4:05:36 PM.    Final      Subjective: - no chest pain, shortness of breath, no abdominal pain, nausea or vomiting.   Discharge Exam: BP 116/66   Pulse 84   Temp 98.1 F (36.7 C) (Oral)   Resp 16   Ht 6' (1.829 m)   Wt 95.7 kg   SpO2 93%   BMI 28.62 kg/m   General: Pt is alert, awake, not in acute distress Cardiovascular: RRR, S1/S2 +, no rubs, no gallops Respiratory: CTA bilaterally, no wheezing, no rhonchi Abdominal: Soft, NT, ND, bowel sounds + Extremities: no edema, no cyanosis   The results of significant diagnostics from this hospitalization (including imaging, microbiology, ancillary and laboratory) are listed below for reference.     Microbiology: Recent Results (from the past 240 hour(s))  Body fluid culture w Gram Stain     Status: None   Collection Time: 08/12/21  5:00 PM   Specimen: Body Fluid  Result Value Ref Range Status   Specimen Description   Final    FLUID PLEURAL  RIGHT Performed at [facility] [address] [place], [place] [address]    Special Requests   Final    NONE Performed at [facility], [place] [address], [place], [place] [address]    Gram Stain   Final    ABUNDANT WBC PRESENT, PREDOMINANTLY MONONUCLEAR ABUNDANT GRAM NEGATIVE RODS RARE GRAM POSITIVE COCCI IN CHAINS    Culture   Final    ABUNDANT STREPTOCOCCUS CONSTELLATUS ABUNDANT STREPTOCOCCUS ANGINOSIS ABUNDANT PREVOTELLA DENTICOLA BETA LACTAMASE POSITIVE Performed at [facility], [place] [address] [place], [place] [address]    Report Status 08/16/2021 FINAL  Final   Organism ID, Bacteria STREPTOCOCCUS ANGINOSIS  Final   Organism ID, Bacteria STREPTOCOCCUS CONSTELLATUS  Final      Susceptibility   Streptococcus constellatus - MIC*    PENICILLIN 0.12 SENSITIVE Sensitive     CEFTRIAXONE >=8 RESISTANT Resistant     ERYTHROMYCIN <=0.12 SENSITIVE Sensitive     LEVOFLOXACIN 0.5 SENSITIVE Sensitive     * ABUNDANT STREPTOCOCCUS CONSTELLATUS   Streptococcus anginosis - MIC*    PENICILLIN <=0.06 SENSITIVE Sensitive     CEFTRIAXONE 0.25 SENSITIVE Sensitive     ERYTHROMYCIN <=0.12 SENSITIVE Sensitive     LEVOFLOXACIN 0.5 SENSITIVE Sensitive     VANCOMYCIN 1 SENSITIVE Sensitive     * ABUNDANT STREPTOCOCCUS ANGINOSIS     Labs: Basic Metabolic Panel: Recent Labs  Lab 08/14/21 0449 08/15/21 0515 08/16/21 0300 08/17/21 0108 08/18/21 0622  NA 133* 135 137 140 142  K 4.8 4.6 4.0 4.0 4.7  CL 100 103 104 108 108  CO2 23 25 23 25 27   GLUCOSE 121* 172* 161* 111* 134*  BUN 17 18 20 14 13   CREATININE 1.05 1.02 1.05 0.85 0.75  CALCIUM 8.8* 8.6* 8.7* 8.7* 9.1  MG  --   --   --   --  2.2  PHOS  --   --   --   --  3.4   Liver Function Tests: Recent Labs  Lab 08/15/21 0515  AST 21  ALT 12  ALKPHOS 146*  BILITOT 0.4  PROT 6.2*  ALBUMIN  1.8*   CBC: Recent Labs  Lab 08/12/21 0419 08/13/21 0603 08/14/21 0449 08/15/21 0515  08/16/21 0300 08/17/21 0108 08/18/21 0622  WBC 25.7*   < > 20.5* 17.7* 12.1* 13.6* 15.1*  NEUTROABS 22.3*  --   --   --   --   --   --   HGB 8.2*   < > 8.0* 8.4* 7.7* 7.9* 8.5*  HCT 27.6*   < > 26.7* 28.9* 25.9* 26.2* 28.6*  MCV 94.2   < > 93.0 94.8 91.8 92.3 93.2  PLT 353   < > 400 483* 437* 479* 497*   < > = values in this interval not displayed.   CBG: Recent Labs  Lab 08/17/21 1145 08/17/21 1638 08/17/21 2129 08/18/21 0749 08/18/21 1200  GLUCAP 148* 129* 130* 139* 171*   Hgb A1c No results for input(s): HGBA1C in the last 72 hours. Lipid Profile No results for input(s): CHOL, HDL, LDLCALC, TRIG, CHOLHDL, LDLDIRECT in the last 72 hours. Thyroid function studies No results for input(s): TSH, T4TOTAL, T3FREE, THYROIDAB in the last 72 hours.  Invalid input(s): FREET3 Urinalysis    Component Value Date/Time   PROTEINUR <30 06/17/2021 0940    FURTHER DISCHARGE INSTRUCTIONS:   Get Medicines reviewed and adjusted: Please take all your medications with you for your next visit with your Primary MD   Laboratory/radiological data: Please request your Primary MD to go over all hospital tests and procedure/radiological results at the follow up, please ask your Primary MD to get all Hospital records sent to his/her office.   In some cases, they will be blood work, cultures and biopsy results pending at the time of your discharge. Please request that your primary care M.D. goes through all the records of your hospital data and follows up on these results.   Also Note the following: If you experience worsening of your admission symptoms, develop shortness of breath, life threatening emergency, suicidal or homicidal thoughts you must seek medical attention immediately by calling 911 or calling your MD immediately  if symptoms less severe.   You must read complete instructions/literature along with all the possible adverse reactions/side effects for all the Medicines you take and  that have been prescribed to you. Take any new Medicines after you have completely understood and accpet all the possible adverse reactions/side effects.    Do not drive when taking Pain medications or sleeping medications (Benzodaizepines)   Do not take more than prescribed Pain, Sleep and Anxiety Medications. It is not advisable to combine anxiety,sleep and pain medications without talking with your primary care practitioner   Special Instructions: If you have smoked or chewed Tobacco  in the last 2 yrs please stop smoking, stop any regular Alcohol  and or any Recreational drug use.   Wear Seat belts while driving.   Please note: You were cared for by a hospitalist during your hospital stay. Once you are discharged, your primary care physician will handle any further medical issues. Please note that NO REFILLS for any discharge medications will be authorized once you are discharged, as it is imperative that you return to your primary care physician (or establish a relationship with a primary care physician if you do not have one) for your post hospital discharge needs so that they can reassess your need for medications and monitor your lab values.  Time coordinating discharge: 45 minutes  SIGNED:  Marzetta Board, MD, PhD 08/18/2021, 12:04 PM

## 2021-08-18 NOTE — Telephone Encounter (Signed)
Receptionist called to check status of [place] Disability form.  "William Farrell wife, Loletha Carrow is in lobby for pick -up.  Form due today or will loose their home."   Forms nurse received form 08/13/2021 faxed to [employer]; fourteen calendar days to process forms per [facility] protocol.   Connected with spouse [name] in lobby.  Completed form with information needed from patient.   [facility] authorization to release information provided with [employer] business card.   Advised call Mortgage for next steps regarding assistance with home.  [employer] may be able to discover assistance available through oncology patient grants.   [name] returned a [employer] and [employer] authorization for R.O.I. signed by patient to [facility]. Completed form successfully faxed to [facility] with medication list, recent treatment received 07/09/2021, 06/08/2021 CT scan results and 07/30/2021 visit note.   Original  mailed to address per EMR.   Message left  for [name]; this nurse requested start and end dates of records requested by Bebe Liter for (SW) H.I.M.  [place] "today's priority is the physician statement.  Bebe Liter will request records after we determine if leave is approved."

## 2021-08-18 NOTE — Plan of Care (Signed)
Per Dr. Arman Filter verbal order to have the patient ambulate in his room without oxygen on and to monitor the patient's saturation level while ambulating (notify Dr. Cruzita Lederer of an oxygen saturation less than 88%). The patient is to be discharged and have a walker delivered to his home to assist with his ambulation. After speaking to the patient's wife, the patient has never used a walker before. This nurse will reach out to PT/OT to see if they can walk the patient with the equipment the patient will be discharged with.

## 2021-08-18 NOTE — Progress Notes (Signed)
Physical Therapy Treatment Patient Details Name: William Farrell MRN: [id number] DOB: Aug 17, 1961 Today's Date: 08/18/2021   History of Present Illness Pt is 60 year old with history of stage IIIa non-small cell lung cancer on chemo who presents to the hospital for fatigue and poor oral intake for the past 2 weeks on 07/30/21.  He was found to be in atrial fibrillation with RVR, DKA and had loculated pleural effusion. Chest tube was inserted 07/30/2021. Pt with other hx of HLD and DM2    PT Comments    The patient  is sleeping in bed upon arrival. Patient aroused and mobilized to sitting on bed edge with supervision. SPO2 on RA 91 %/ Patient stoode from raised bed , stepped in place x 5, sat rested and 5 more steps in place. SPO2 855.   Placed on 2 L , Ambulated with  mod assistance for safety and equipment 5' then 8'. Spo2 95%.  Patient demonstrates significant weakness of legs, knees flexed during gait.   Addressed recommendation of short term rehab before going home, patient not agreeable.  Due to significant weakness and risk for fall, recommend  Non emergency transport. Wife present and agrees. Patient  is to get a RW and  hospital bed today.  Recommendations for follow up therapy are one component of a multi-disciplinary discharge planning process, led by the attending physician.  Recommendations may be updated based on patient status, additional functional criteria and insurance authorization.  Follow Up Recommendations  Home health PT;Supervision for mobility/OOB vs SNF.(Pt. declines)     Equipment Recommendations  Rolling walker with 5" wheels;Hospital bed (may need a WC)    Recommendations for Other Services       Precautions / Restrictions Precautions Precautions: Fall Precaution Comments: monitor sats, very weak, on O2     Mobility  Bed Mobility         Supine to sit: Supervision;HOB elevated     General bed mobility comments: patient moved to bed edge  with  supervision    Transfers Overall transfer level: Needs assistance Equipment used: Rolling walker (2 wheeled) Transfers: Sit to/from Stand Sit to Stand: Min assist;Mod assist         General transfer comment: Min assist from raised bed, mod from lower seat.  Ambulation/Gait Stepped in place x 5 x 2  at RW prior to amb, seated rest breaks between. Noted knees flexed and slow to step Ambulation/Gait assistance: Mod assist;+2 safety/equipment;+2 physical assistance Gait Distance (Feet): 5 Feet (then 8') Assistive device: Rolling walker (2 wheeled) Gait Pattern/deviations: Step-to pattern Gait velocity: decreased   General Gait Details: knees flexed each step, shuffling steps, unsteady and slower when fatigued.   Stairs             Wheelchair Mobility    Modified Rankin (Stroke Patients Only)       Balance   Sitting-balance support: Feet supported;No upper extremity supported Sitting balance-Leahy Scale: Fair     Standing balance support: Bilateral upper extremity supported Standing balance-Leahy Scale: Poor Standing balance comment: reliant on UE for support, LE wekaness.                            Cognition Arousal/Alertness: Awake/alert Behavior During Therapy: Agitated Overall Cognitive Status: Impaired/Different from baseline Area of Impairment: Safety/judgement;Awareness                         Safety/Judgement: Decreased awareness of deficits;Decreased  awareness of safety Awareness: Emergent   General Comments: patient very drowsy  when not mobilizing. frequent cues for safety measures needed by therapists.      Exercises      General Comments        Pertinent Vitals/Pain Pain Assessment: No/denies pain    Home Living                      Prior Function            PT Goals (current goals can now be found in the care plan section) Progress towards PT goals: Progressing toward goals    Frequency    Min  3X/week      PT Plan Current plan remains appropriate    Co-evaluation              AM-PAC PT "6 Clicks" Mobility   Outcome Measure  Help needed turning from your back to your side while in a flat bed without using bedrails?: A Little Help needed moving from lying on your back to sitting on the side of a flat bed without using bedrails?: A Little Help needed moving to and from a bed to a chair (including a wheelchair)?: A Lot Help needed standing up from a chair using your arms (e.g., wheelchair or bedside chair)?: A Lot Help needed to walk in hospital room?: A Lot Help needed climbing 3-5 steps with a railing? : Total 6 Click Score: 13    End of Session Equipment Utilized During Treatment: Gait belt;Oxygen Activity Tolerance: Patient limited by fatigue Patient left: in chair;with call bell/phone within reach;with chair alarm set;with family/visitor present Nurse Communication: Mobility status PT Visit Diagnosis: Other abnormalities of gait and mobility (R26.89);Muscle weakness (generalized) (M62.81)     Time: 1430-1530 PT Time Calculation (min) (ACUTE ONLY): 60 min  Charges:  $Gait Training: 23-37 mins $Therapeutic Activity: 8-22 mins $Self Care/Home Management: [place] Pager [phone] Office [phone]   Claretha Cooper 08/18/2021, 3:48 PM

## 2021-08-18 NOTE — TOC Progression Note (Addendum)
Transition of Care [facility]) - Progression Note    Patient Details  Name: William Farrell MRN: [id number] Date of Birth: 11/02/1961  [facility]) CM/SW Contact  Leeroy Cha, RN Phone Number: 08/18/2021, 8:44 AM  Clinical Narrative:    Being seen by palliative care remains a full code.  Wants to go home with hhc before deciding about next steps towards hosipce. Tct-wife-[name]-equipment being delivered today. Text snet to [employer] to add in 3 in 1 and over the bed table.  Does not has a preference of a hhc agency. Text sent to [facility] for pt and rn.  Expected Discharge Plan: [place] Barriers to Discharge: Continued Medical Work up  Expected Discharge Plan and Services Expected Discharge Plan: [place]   Discharge Planning Services: CM Consult   Living arrangements for the past 2 months: Single Family Home                                       Social Determinants of Health (SDOH) Interventions    Readmission Risk Interventions No flowsheet data found.

## 2021-08-18 NOTE — TOC Transition Note (Addendum)
Transition of Care [facility]) - CM/SW Discharge Note   Patient Details  Name: Talbot Monarch MRN: [id number] Date of Birth: 1961/06/11  Transition of Care [facility]) CM/SW Contact:  Leeroy Cha, RN Phone Number: 08/18/2021, 2:06 PM   Clinical Narrative:    Hhc through [place]? Question of chest tube but notes do state that chest tube removed this am.  This information read to [facility] who will call back with decision.  [facility] unable to do hhc. Will be done through [facility] for rn and pt. Dme ordered and scheduled for delivery through [employer]     Barriers to Discharge: Continued Medical Work up   Patient Goals and CMS Choice Patient states their goals for this hospitalization and ongoing recovery are:: to go home CMS Medicare.gov Compare Post Acute Care list provided to:: Patient    Discharge Placement                       Discharge Plan and Services   Discharge Planning Services: CM Consult            DME Arranged: Hospital bed, 3-N-1, Overbed table, Walker rolling DME Agency: [employer] Date DME Agency Contacted: 08/18/21 Time DME Agency Contacted: [phone] Representative spoke with at DME Agency: [place]: PT, RN [place] Agency: [place] Date [place]: 08/18/21 Time [place]: [id number] Representative spoke with at [place]: stacie  Social Determinants of Health ([place]) Interventions     Readmission Risk Interventions No flowsheet data found.

## 2021-08-18 NOTE — Progress Notes (Signed)
   NAME:  William Farrell, MRN:  [id number], DOB:  Apr 07, 1961, LOS: 64 ADMISSION DATE:  07/30/2021, CONSULTATION DATE:  9/15 REFERRING MD:  Marylyn Ishihara, CHIEF COMPLAINT:  Dyspnea, weakness   History of Present Illness:  60 y/o male with known RUL lung cancer who was admitted in the setting of dyspnea and a pleural effusion.  He was found to have an empyema in the right lung when a pigtail catheter was placed.   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 rate 13, height 6' (1.829 m), weight 95.7 kg, SpO2 96 %.        Intake/Output Summary (Last 24 hours) at 08/18/2021 0845 Last data filed at 08/18/2021 0700 Gross per 24 hour  Intake 566.2 ml  Output 1250 ml  Net -683.8 ml   Filed Weights   07/30/21 1133 08/06/21 1255 08/12/21 1524  Weight: 88.9 kg 91.9 kg 95.7 kg    Examination:  General this is 60 year old male resting in bed Hent NCAT no JVD MMM Pulm decreased on right w/ minimal breath sounds.  Card rrr Abd soft not tender  Ext warm and dry has LE edema Neuro intact   Pcxr personally reviewed. No sig change w/ right hydropneumo.    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 BPF  -strep intermedius initially, but repeat cx suggestive of second pathogens  -9/30 was day 3/3 tpa/dornase  -we clamped chest tube now for over 24 hrs. CXR personally reviewed really looks the same. There is still significant lucency involving the upper lobe. RML pleural thickening but overall no sig change  -with chest tube removed and chest cavity not fully drained  the best we can hope for is that the right hemithorax essentially evolve into fibrothorax (I can't see how he could ever have therapy for his cancer again) Plan PRN analgesia Cont abx (might need to talk to ID about his ideal abx regimen if he goes home) He really needs to Be DNR (note reviewed from palliative) Will dc the chest tube. Looks like this is more trapped lung.    Best Practice (right click and "Reselect all SmartList Selections" daily)   Per Duarte ACNP-BC [place] Pager # [phone] OR # [phone] if no answer

## 2021-08-19 ENCOUNTER — Telehealth: Payer: Self-pay | Admitting: Medical Oncology

## 2021-08-19 ENCOUNTER — Encounter: Payer: Self-pay | Admitting: Internal Medicine

## 2021-08-19 NOTE — Telephone Encounter (Signed)
Per Dr. Julien Nordmann, I told wife to cancel pt appt tomorrow and scheduler will call her.

## 2021-08-20 ENCOUNTER — Inpatient Hospital Stay: Payer: BC Managed Care – PPO

## 2021-08-20 ENCOUNTER — Inpatient Hospital Stay: Payer: BC Managed Care – PPO | Admitting: Internal Medicine

## 2021-08-20 DIAGNOSIS — D631 Anemia in chronic kidney disease: Secondary | ICD-10-CM | POA: Diagnosis not present

## 2021-08-20 DIAGNOSIS — E1122 Type 2 diabetes mellitus with diabetic chronic kidney disease: Secondary | ICD-10-CM | POA: Diagnosis not present

## 2021-08-20 DIAGNOSIS — A409 Streptococcal sepsis, unspecified: Secondary | ICD-10-CM | POA: Diagnosis not present

## 2021-08-20 DIAGNOSIS — I82462 Acute embolism and thrombosis of left calf muscular vein: Secondary | ICD-10-CM | POA: Diagnosis not present

## 2021-08-20 DIAGNOSIS — E440 Moderate protein-calorie malnutrition: Secondary | ICD-10-CM | POA: Diagnosis not present

## 2021-08-20 DIAGNOSIS — I4891 Unspecified atrial fibrillation: Secondary | ICD-10-CM | POA: Diagnosis not present

## 2021-08-20 DIAGNOSIS — B9689 Other specified bacterial agents as the cause of diseases classified elsewhere: Secondary | ICD-10-CM | POA: Diagnosis not present

## 2021-08-20 DIAGNOSIS — I82452 Acute embolism and thrombosis of left peroneal vein: Secondary | ICD-10-CM | POA: Diagnosis not present

## 2021-08-20 DIAGNOSIS — E039 Hypothyroidism, unspecified: Secondary | ICD-10-CM | POA: Diagnosis not present

## 2021-08-20 DIAGNOSIS — R1310 Dysphagia, unspecified: Secondary | ICD-10-CM | POA: Diagnosis not present

## 2021-08-20 DIAGNOSIS — K219 Gastro-esophageal reflux disease without esophagitis: Secondary | ICD-10-CM | POA: Diagnosis not present

## 2021-08-20 DIAGNOSIS — E785 Hyperlipidemia, unspecified: Secondary | ICD-10-CM | POA: Diagnosis not present

## 2021-08-20 DIAGNOSIS — K5792 Diverticulitis of intestine, part unspecified, without perforation or abscess without bleeding: Secondary | ICD-10-CM | POA: Diagnosis not present

## 2021-08-20 DIAGNOSIS — C3411 Malignant neoplasm of upper lobe, right bronchus or lung: Secondary | ICD-10-CM | POA: Diagnosis not present

## 2021-08-20 DIAGNOSIS — J869 Pyothorax without fistula: Secondary | ICD-10-CM | POA: Diagnosis not present

## 2021-08-20 DIAGNOSIS — N1831 Chronic kidney disease, stage 3a: Secondary | ICD-10-CM | POA: Diagnosis not present

## 2021-08-24 ENCOUNTER — Telehealth: Payer: Self-pay | Admitting: *Deleted

## 2021-08-24 ENCOUNTER — Encounter: Payer: Self-pay | Admitting: Internal Medicine

## 2021-08-24 NOTE — Progress Notes (Signed)
Patient's spouse called with concerns regarding medical equipment need at home. Advised I would send message to RN and provider for someone to follow up.  Staff message sent to Rns and provider with patient care team.

## 2021-08-24 NOTE — Telephone Encounter (Signed)
Pt states [facility] does not have wheelchairs. Wife states he needs a WC by Wednesday in order for him to go to Pulmonary. She has called [facility] to ask about a WC and they are working on it. Wife also asked about oxygen, encouraged her to take with Pulmonologist about O2. She says Pulmonary office does not have WCs available in the lobby, RN advised her to call their office and ask if they have one available for use for his visit.   WCB if she is not able to get WC from [facility].

## 2021-08-25 DIAGNOSIS — E785 Hyperlipidemia, unspecified: Secondary | ICD-10-CM | POA: Diagnosis not present

## 2021-08-25 DIAGNOSIS — I82462 Acute embolism and thrombosis of left calf muscular vein: Secondary | ICD-10-CM | POA: Diagnosis not present

## 2021-08-25 DIAGNOSIS — E440 Moderate protein-calorie malnutrition: Secondary | ICD-10-CM | POA: Diagnosis not present

## 2021-08-25 DIAGNOSIS — I4891 Unspecified atrial fibrillation: Secondary | ICD-10-CM | POA: Diagnosis not present

## 2021-08-25 DIAGNOSIS — E1122 Type 2 diabetes mellitus with diabetic chronic kidney disease: Secondary | ICD-10-CM | POA: Diagnosis not present

## 2021-08-25 DIAGNOSIS — J869 Pyothorax without fistula: Secondary | ICD-10-CM | POA: Diagnosis not present

## 2021-08-25 DIAGNOSIS — K5792 Diverticulitis of intestine, part unspecified, without perforation or abscess without bleeding: Secondary | ICD-10-CM | POA: Diagnosis not present

## 2021-08-25 DIAGNOSIS — I82452 Acute embolism and thrombosis of left peroneal vein: Secondary | ICD-10-CM | POA: Diagnosis not present

## 2021-08-25 DIAGNOSIS — C3411 Malignant neoplasm of upper lobe, right bronchus or lung: Secondary | ICD-10-CM | POA: Diagnosis not present

## 2021-08-25 DIAGNOSIS — R1310 Dysphagia, unspecified: Secondary | ICD-10-CM | POA: Diagnosis not present

## 2021-08-25 DIAGNOSIS — B9689 Other specified bacterial agents as the cause of diseases classified elsewhere: Secondary | ICD-10-CM | POA: Diagnosis not present

## 2021-08-25 DIAGNOSIS — N1831 Chronic kidney disease, stage 3a: Secondary | ICD-10-CM | POA: Diagnosis not present

## 2021-08-25 DIAGNOSIS — E039 Hypothyroidism, unspecified: Secondary | ICD-10-CM | POA: Diagnosis not present

## 2021-08-25 DIAGNOSIS — D631 Anemia in chronic kidney disease: Secondary | ICD-10-CM | POA: Diagnosis not present

## 2021-08-25 DIAGNOSIS — K219 Gastro-esophageal reflux disease without esophagitis: Secondary | ICD-10-CM | POA: Diagnosis not present

## 2021-08-25 DIAGNOSIS — A409 Streptococcal sepsis, unspecified: Secondary | ICD-10-CM | POA: Diagnosis not present

## 2021-08-26 ENCOUNTER — Inpatient Hospital Stay: Payer: BC Managed Care – PPO | Admitting: Acute Care

## 2021-08-28 ENCOUNTER — Inpatient Hospital Stay: Payer: BC Managed Care – PPO

## 2021-08-28 ENCOUNTER — Inpatient Hospital Stay: Payer: BC Managed Care – PPO | Attending: Internal Medicine | Admitting: Internal Medicine

## 2021-08-28 ENCOUNTER — Other Ambulatory Visit: Payer: Self-pay

## 2021-08-28 ENCOUNTER — Telehealth: Payer: Self-pay | Admitting: Medical Oncology

## 2021-08-28 VITALS — BP 115/59 | HR 84 | Temp 96.7°F | Resp 19 | Ht 72.0 in

## 2021-08-28 DIAGNOSIS — J869 Pyothorax without fistula: Secondary | ICD-10-CM | POA: Diagnosis not present

## 2021-08-28 DIAGNOSIS — R5383 Other fatigue: Secondary | ICD-10-CM

## 2021-08-28 DIAGNOSIS — R634 Abnormal weight loss: Secondary | ICD-10-CM

## 2021-08-28 DIAGNOSIS — R590 Localized enlarged lymph nodes: Secondary | ICD-10-CM | POA: Diagnosis not present

## 2021-08-28 DIAGNOSIS — I480 Paroxysmal atrial fibrillation: Secondary | ICD-10-CM | POA: Diagnosis not present

## 2021-08-28 DIAGNOSIS — G893 Neoplasm related pain (acute) (chronic): Secondary | ICD-10-CM | POA: Diagnosis not present

## 2021-08-28 DIAGNOSIS — N1831 Chronic kidney disease, stage 3a: Secondary | ICD-10-CM | POA: Diagnosis not present

## 2021-08-28 DIAGNOSIS — E119 Type 2 diabetes mellitus without complications: Secondary | ICD-10-CM | POA: Diagnosis not present

## 2021-08-28 DIAGNOSIS — C770 Secondary and unspecified malignant neoplasm of lymph nodes of head, face and neck: Secondary | ICD-10-CM | POA: Insufficient documentation

## 2021-08-28 DIAGNOSIS — E860 Dehydration: Secondary | ICD-10-CM

## 2021-08-28 DIAGNOSIS — E039 Hypothyroidism, unspecified: Secondary | ICD-10-CM | POA: Diagnosis not present

## 2021-08-28 DIAGNOSIS — D649 Anemia, unspecified: Secondary | ICD-10-CM | POA: Diagnosis not present

## 2021-08-28 DIAGNOSIS — R0602 Shortness of breath: Secondary | ICD-10-CM | POA: Insufficient documentation

## 2021-08-28 DIAGNOSIS — Z79891 Long term (current) use of opiate analgesic: Secondary | ICD-10-CM | POA: Insufficient documentation

## 2021-08-28 DIAGNOSIS — C3481 Malignant neoplasm of overlapping sites of right bronchus and lung: Secondary | ICD-10-CM | POA: Diagnosis not present

## 2021-08-28 DIAGNOSIS — Z79899 Other long term (current) drug therapy: Secondary | ICD-10-CM | POA: Insufficient documentation

## 2021-08-28 DIAGNOSIS — Z9221 Personal history of antineoplastic chemotherapy: Secondary | ICD-10-CM | POA: Insufficient documentation

## 2021-08-28 DIAGNOSIS — C3490 Malignant neoplasm of unspecified part of unspecified bronchus or lung: Secondary | ICD-10-CM | POA: Diagnosis not present

## 2021-08-28 DIAGNOSIS — R222 Localized swelling, mass and lump, trunk: Secondary | ICD-10-CM | POA: Insufficient documentation

## 2021-08-28 DIAGNOSIS — C3411 Malignant neoplasm of upper lobe, right bronchus or lung: Secondary | ICD-10-CM | POA: Diagnosis not present

## 2021-08-28 DIAGNOSIS — I82423 Acute embolism and thrombosis of iliac vein, bilateral: Secondary | ICD-10-CM | POA: Diagnosis not present

## 2021-08-28 DIAGNOSIS — Z5111 Encounter for antineoplastic chemotherapy: Secondary | ICD-10-CM

## 2021-08-28 DIAGNOSIS — E785 Hyperlipidemia, unspecified: Secondary | ICD-10-CM | POA: Diagnosis not present

## 2021-08-28 DIAGNOSIS — Z923 Personal history of irradiation: Secondary | ICD-10-CM | POA: Diagnosis not present

## 2021-08-28 DIAGNOSIS — R531 Weakness: Secondary | ICD-10-CM | POA: Diagnosis not present

## 2021-08-28 DIAGNOSIS — Z95828 Presence of other vascular implants and grafts: Secondary | ICD-10-CM

## 2021-08-28 LAB — CBC WITH DIFFERENTIAL (CANCER CENTER ONLY)
Abs Immature Granulocytes: 0.09 10*3/uL — ABNORMAL HIGH (ref 0.00–0.07)
Basophils Absolute: 0.1 10*3/uL (ref 0.0–0.1)
Basophils Relative: 0 %
Eosinophils Absolute: 0.1 10*3/uL (ref 0.0–0.5)
Eosinophils Relative: 0 %
HCT: 27.3 % — ABNORMAL LOW (ref 39.0–52.0)
Hemoglobin: 8.2 g/dL — ABNORMAL LOW (ref 13.0–17.0)
Immature Granulocytes: 1 %
Lymphocytes Relative: 5 %
Lymphs Abs: 0.8 10*3/uL (ref 0.7–4.0)
MCH: 26.8 pg (ref 26.0–34.0)
MCHC: 30 g/dL (ref 30.0–36.0)
MCV: 89.2 fL (ref 80.0–100.0)
Monocytes Absolute: 1 10*3/uL (ref 0.1–1.0)
Monocytes Relative: 6 %
Neutro Abs: 14.5 10*3/uL — ABNORMAL HIGH (ref 1.7–7.7)
Neutrophils Relative %: 88 %
Platelet Count: 496 10*3/uL — ABNORMAL HIGH (ref 150–400)
RBC: 3.06 MIL/uL — ABNORMAL LOW (ref 4.22–5.81)
RDW: 19.7 % — ABNORMAL HIGH (ref 11.5–15.5)
WBC Count: 16.6 10*3/uL — ABNORMAL HIGH (ref 4.0–10.5)
nRBC: 0 % (ref 0.0–0.2)

## 2021-08-28 LAB — CMP (CANCER CENTER ONLY)
ALT: 12 U/L (ref 0–44)
AST: 16 U/L (ref 15–41)
Albumin: 1.8 g/dL — ABNORMAL LOW (ref 3.5–5.0)
Alkaline Phosphatase: 108 U/L (ref 38–126)
Anion gap: 9 (ref 5–15)
BUN: 13 mg/dL (ref 8–23)
CO2: 27 mmol/L (ref 22–32)
Calcium: 9 mg/dL (ref 8.9–10.3)
Chloride: 99 mmol/L (ref 98–111)
Creatinine: 0.92 mg/dL (ref 0.61–1.24)
GFR, Estimated: >60 mL/min (ref >60)
Glucose, Bld: 186 mg/dL — ABNORMAL HIGH (ref 70–99)
Potassium: 4.6 mmol/L (ref 3.5–5.1)
Sodium: 135 mmol/L (ref 135–145)
Total Bilirubin: 0.4 mg/dL (ref 0.3–1.2)
Total Protein: 6.6 g/dL (ref 6.5–8.1)

## 2021-08-28 LAB — TSH: TSH: 7.523 u[IU]/mL — ABNORMAL HIGH (ref 0.320–4.118)

## 2021-08-28 MED ORDER — MORPHINE SULFATE ER 30 MG PO TBCR: 30.0000 mg | EXTENDED_RELEASE_TABLET | Freq: Two times a day (BID) | ORAL | 0 refills | Status: DC

## 2021-08-28 MED ORDER — SODIUM CHLORIDE 0.9% FLUSH
10.0000 mL | Freq: Once | INTRAVENOUS | Status: AC
  Administered 2021-08-28: 10 mL

## 2021-08-28 MED ORDER — HEPARIN SOD (PORK) LOCK FLUSH 100 UNIT/ML IV SOLN
250.0000 [IU] | Freq: Once | INTRAVENOUS | Status: AC
  Administered 2021-08-28: 250 [IU]

## 2021-08-28 NOTE — Progress Notes (Signed)
[place] [phone]   [phone]) [phone]  OFFICE PROGRESS NOTE  [name], DO [address] [place] [address]  DIAGNOSIS: Recurrent/progressive non-small cell lung cancer initially diagnosed as stage IIIA (T2a, N2, M0) non-small cell lung cancer, squamous cell carcinoma diagnosed in April 2021, presented with right upper lobe/suprahilar lung mass in addition to right paratracheal and subcarinal lymphadenopathy.  Molecular Studies by [facility]: No actionable mutations   PRIOR THERAPY:  1) Concurrent chemoradiation with weekly carboplatin for AUC of 2 and paclitaxel 45 NG/M2.  First dose on Mar 31, 2020.  Status post 7 cycles.  Last dose was given on 05/12/2020 with partial response. 2) Consolidation immunotherapy with durvalumab 1500 mg IV every 4 weeks.  First dose May 12, 2020. Status post 3 cycles.  3) Systemic chemotherapy with carboplatin for an AUC of 5, paclitaxel 175 mg/m2, and Keytruda 200 mg IV every 3 weeks. First dose expected on 10/02/20.   Status post 3 cycles.  Last dose was given on November 13, 2020. 4) SBRT to the enlarging right upper lobe lung nodules under the care of Dr. Lisbeth Renshaw. 5) Second line systemic chemotherapy with docetaxel 75 Mg/M2 and Cyramza 10 mg/KG every 3 weeks with Neulasta support.  First dose December 11, 2020.  Status post 11  cycles.  Starting from cycle #10 I will reduce his dose of docetaxel to 65 Mg/M2 because of fatigue and intolerance.  CURRENT THERAPY: Palliative and hospice care   INTERVAL HISTORY: William Farrell 60 y.o. male returns to the clinic today for hospital follow-up visit.  The patient was admitted to [facility] on 07/30/2021 and stayed in the hospital for almost 3 weeks because of empyema of the right lung.  He was treated with aggressive course of antibiotics as well as chest tube placement for drainage of the empyema.  He had repeat imaging studies during his  hospitalization that showed clear evidence for disease progression.  He continues to have significant fatigue and weakness.  He has intermittent right-sided chest pain in addition to the baseline shortness of breath and cough with no hemoptysis.  He denied having any nausea, vomiting, diarrhea or constipation.  He has no headache or visual changes.  He lost a lot of weight recently.  He is here today for reevaluation and discussion of his treatment options.   MEDICAL HISTORY: Past Medical History:  Diagnosis Date   Asthma    as a child   Diabetes ([place])    Diverticulitis    GERD (gastroesophageal reflux disease)    High cholesterol    History of kidney stones    Lung cancer (HCC)    Wears glasses     ALLERGIES:  is allergic to sitagliptin.  MEDICATIONS:  Current Outpatient Medications  Medication Sig Dispense Refill   acetaminophen (TYLENOL) 500 MG tablet Take 1,000 mg by mouth every 6 (six) hours as needed for mild pain.     albuterol (PROVENTIL) (2.5 MG/3ML) 0.083% nebulizer solution Take 3 mLs (2.5 mg total) by nebulization every 4 (four) hours as needed for wheezing or shortness of breath (EVERY 4 HOURS AND PRN). 120 mL 5   albuterol (VENTOLIN HFA) 108 (90 Base) MCG/ACT inhaler Inhale 1-2 puffs into the lungs every 4 (four) hours as needed for wheezing or shortness of breath. 18 g 3   amoxicillin-clavulanate (AUGMENTIN) 875-125 MG tablet Take 1 tablet by mouth every 12 (twelve) hours for 28 days. 56 tablet 0  apixaban (ELIQUIS) 5 MG TABS tablet Take 2 tablets (10 mg total) by mouth 2 (two) times daily for 5 days. 20 tablet 0   apixaban (ELIQUIS) 5 MG TABS tablet Take 1 tablet (5 mg total) by mouth 2 (two) times daily. 60 tablet 0   atorvastatin (LIPITOR) 40 MG tablet TAKE 1 TABLET DAILY (Patient taking differently: Take 40 mg by mouth at bedtime.) 90 tablet 3   benzonatate (TESSALON) 200 MG capsule TAKE 1 CAPSULE (200 MG TOTAL) BY MOUTH 3 (THREE) TIMES DAILY AS NEEDED FOR COUGH. 90  capsule 1   Budeson-Glycopyrrol-Formoterol (BREZTRI AEROSPHERE) 160-9-4.8 MCG/ACT AERO Inhale 2 puffs into the lungs in the morning and at bedtime. 10.7 g 0   dronabinol (MARINOL) 5 MG capsule Take 1 capsule by mouth 2  times daily before lunch and supper. 60 capsule 2   Ferrous Fumarate-Vitamin C ER 65-25 MG TBCR Take 65 mg by mouth daily.     glipiZIDE (GLUCOTROL) 5 MG tablet TAKE 1 TABLET TWICE A DAY BEFORE MEALS (Patient taking differently: Take 5 mg by mouth 2 (two) times daily before a meal.) 180 tablet 0   Lancets (ONETOUCH DELICA PLUS [id number]) MISC USE AS DIRECTED UP TO FOUR TIMES A DAY 100 each 13   levothyroxine (SYNTHROID) 75 MCG tablet TAKE 1 TABLET BY MOUTH EVERY DAY BEFORE BREAKFAST (Patient taking differently: Take 75 mcg by mouth daily before breakfast.) 30 tablet 1   lidocaine-prilocaine (EMLA) cream Apply 1 application topically as needed. (Patient taking differently: Apply 1 application topically as needed (port access).) 30 g 2   loratadine (CLARITIN) 10 MG tablet Take 10 mg by mouth daily as needed for allergies (after chemo shot Fri, sat. sun.).     meloxicam (MOBIC) 15 MG tablet TAKE 1 TABLET DAILY (Patient taking differently: Take 15 mg by mouth daily.) 90 tablet 3   metFORMIN (GLUCOPHAGE) 1000 MG tablet TAKE 1 TABLET TWICE A DAY WITH MEALS (Patient taking differently: Take 1,000 mg by mouth 2 (two) times daily with a meal.) 180 tablet 1   metoprolol tartrate (LOPRESSOR) 25 MG tablet Take 1 tablet (25 mg total) by mouth 2 (two) times daily. 60 tablet 0   ondansetron (ZOFRAN) 8 MG tablet Take 1 tablet (8 mg total) by mouth every 8 (eight) hours as needed for nausea or vomiting. 120 tablet 3   ONETOUCH ULTRA test strip USE AS INSTRUCTED UP TO FOUR TIMES A DAY 100 strip 13   pantoprazole (PROTONIX) 40 MG tablet Take 1 tablet (40 mg total) by mouth daily. 90 tablet 3   pilocarpine (SALAGEN) 5 MG tablet Take 1 tablet (5 mg total) by mouth 3 (three) times daily. 30 tablet 2    Semaglutide,0.25 or 0.5MG /DOS, (OZEMPIC, 0.25 OR 0.5 MG/DOSE,) 2 MG/1.5ML SOPN Inject 0.5 mg into the skin once a week. 7.5 mL 1   temazepam (RESTORIL) 15 MG capsule Take 1 capsule (15 mg total) by mouth at bedtime as needed for sleep. (Patient not taking: No sig reported) 30 capsule 0   Tiotropium Bromide-Olodaterol (STIOLTO RESPIMAT) 2.5-2.5 MCG/ACT AERS Inhale 2 puffs into the lungs daily. (Patient not taking: Reported on 07/30/2021) 3 each 3   No current facility-administered medications for this visit.    SURGICAL HISTORY:  Past Surgical History:  Procedure Laterality Date   BIOPSY  03/25/2021   Procedure: BIOPSY;  Surgeon: Lavena Bullion, DO;  Location: [facility];  Service: Gastroenterology;;   BIOPSY  06/11/2021   Procedure: BIOPSY;  Surgeon: Lavena Bullion, DO;  Location: [facility];  Service: Gastroenterology;;   broken bone repair     BRONCHIAL BIOPSY  03/11/2020   Procedure: BRONCHIAL BIOPSIES;  Surgeon: Garner Nash, DO;  Location: [facility];  Service: Pulmonary;;   COLONOSCOPY     around [place]    CRYOTHERAPY  03/11/2020   Procedure: CRYOTHERAPY;  Surgeon: Garner Nash, DO;  Location: [facility];  Service: Pulmonary;;   ESOPHAGEAL DILATION  03/25/2021   Procedure: ESOPHAGEAL DILATION;  Surgeon: Lavena Bullion, DO;  Location: [facility];  Service: Gastroenterology;;   ESOPHAGOGASTRODUODENOSCOPY (EGD) WITH PROPOFOL N/A 03/25/2021   Procedure: ESOPHAGOGASTRODUODENOSCOPY (EGD) WITH PROPOFOL;  Surgeon: Lavena Bullion, DO;  Location: [facility];  Service: Gastroenterology;  Laterality: N/A;  dialation of stricture   ESOPHAGOGASTRODUODENOSCOPY (EGD) WITH PROPOFOL N/A 06/11/2021   Procedure: ESOPHAGOGASTRODUODENOSCOPY (EGD) WITH PROPOFOL;  Surgeon: Lavena Bullion, DO;  Location: [facility];  Service: Gastroenterology;  Laterality: N/A;   FINE NEEDLE ASPIRATION  03/11/2020   Procedure: FINE NEEDLE ASPIRATION (FNA) LINEAR;  Surgeon: Garner Nash, DO;  Location: [facility];  Service: Pulmonary;;   IR IMAGING GUIDED PORT INSERTION  10/16/2020   KNEE ARTHROSCOPY WITH ANTERIOR CRUCIATE LIGAMENT (ACL) REPAIR     x 2   VIDEO BRONCHOSCOPY WITH ENDOBRONCHIAL ULTRASOUND N/A 03/11/2020   Procedure: VIDEO BRONCHOSCOPY WITH ENDOBRONCHIAL ULTRASOUND;  Surgeon: Garner Nash, DO;  Location: [facility];  Service: Pulmonary;  Laterality: N/A;    REVIEW OF SYSTEMS:  Constitutional: positive for anorexia, fatigue, and weight loss Eyes: negative Ears, nose, mouth, throat, and face: negative Respiratory: positive for cough, dyspnea on exertion, and pleurisy/chest pain Cardiovascular: negative Gastrointestinal: negative Genitourinary:negative Integument/breast: negative Hematologic/lymphatic: negative Musculoskeletal:positive for muscle weakness Neurological: negative Behavioral/Psych: negative Endocrine: negative Allergic/Immunologic: negative   PHYSICAL EXAMINATION: General appearance: alert, cooperative, fatigued, and no distress Head: Normocephalic, without obvious abnormality, atraumatic Neck: no adenopathy, no JVD, supple, symmetrical, trachea midline, and thyroid not enlarged, symmetric, no tenderness/mass/nodules Lymph nodes: Cervical, supraclavicular, and axillary nodes normal. Resp: diminished breath sounds RLL and dullness to percussion RLL Back: symmetric, no curvature. ROM normal. No CVA tenderness. Cardio: regular rate and rhythm, S1, S2 normal, no murmur, click, rub or gallop GI: soft, non-tender; bowel sounds normal; no masses,  no organomegaly Extremities: extremities normal, atraumatic, no cyanosis or edema Neurologic: Alert and oriented X 3, normal strength and tone. Normal symmetric reflexes. Normal coordination and gait  ECOG PERFORMANCE STATUS: 2 - Symptomatic, <50% confined to bed  Blood pressure (!) 115/59, pulse 84, temperature (!) 96.7 F (35.9 C), temperature source Tympanic, resp. rate 19, height 6'  (1.829 m), SpO2 91 %.  LABORATORY DATA: Lab Results  Component Value Date   WBC 15.1 (H) 08/18/2021   HGB 8.5 (L) 08/18/2021   HCT 28.6 (L) 08/18/2021   MCV 93.2 08/18/2021   PLT 497 (H) 08/18/2021      Chemistry      Component Value Date/Time   NA 142 08/18/2021 0622   K 4.7 08/18/2021 0622   CL 108 08/18/2021 0622   CO2 27 08/18/2021 0622   BUN 13 08/18/2021 0622   CREATININE 0.75 08/18/2021 0622   CREATININE 2.07 (H) 07/30/2021 0927   CREATININE 1.10 01/14/2020 0859      Component Value Date/Time   CALCIUM 9.1 08/18/2021 0622   ALKPHOS 146 (H) 08/15/2021 0515   AST 21 08/15/2021 0515   AST 20 07/30/2021 0927   ALT 12 08/15/2021 0515   ALT 16 07/30/2021 0927  BILITOT 0.4 08/15/2021 0515   BILITOT 0.4 07/30/2021 3235       RADIOGRAPHIC STUDIES: DG Chest 1 View  Result Date: 08/10/2021 CLINICAL DATA:  Stage III A non-small cell lung cancer on chemotherapy, hyperlipidemia, type II diabetes mellitus, atrial fibrillation, presents with fatigue and poor oral intake for 2 weeks EXAM: CHEST  1 VIEW COMPARISON:  Portable exam 0830 hours compared to 08/06/2021 FINDINGS: LEFT jugular Port-A-Cath with tip projecting over cavoatrial junction. RIGHT thoracostomy tube unchanged. Stable heart size and mediastinal contours. Bibasilar effusions and atelectasis. Large area of opacity in the upper LEFT lung corresponding to known tumor and accompanying atelectasis. Small loculated RIGHT apex pneumothorax despite thoracostomy tube, unchanged. IMPRESSION: No interval change in small bibasilar pleural effusions and bibasilar atelectasis, RIGHT thoracostomy tube with persistent small RIGHT apex pneumothorax, and known RIGHT perihilar/RIGHT upper lobe neoplasm. Electronically Signed   By: Lavonia Dana M.D.   On: 08/10/2021 08:55   CT CHEST WO CONTRAST  Result Date: 08/12/2021 CLINICAL DATA:  Empyema, lung cancer. EXAM: CT CHEST WITHOUT CONTRAST TECHNIQUE: Multidetector CT imaging of the chest  was performed following the standard protocol without IV contrast. COMPARISON:  08/02/2021. FINDINGS: Cardiovascular: Left IJ Port-A-Cath terminates at the SVC RA junction. Atherosclerotic calcification of the aorta. Pulmonic trunk is enlarged. Heart is at the upper limits of normal in size. Trace pericardial effusion. Mediastinum/Nodes: Low right internal jugular adenopathy measures up to approximately 2.2 cm (2/22). Deep right subpectoral lymph node measures 1.6 cm (2/36). Somewhat ill-defined low right paratracheal nodal tissue is difficult to accurately measure given lack of IV contrast. Approximate measurement is 1.9 cm, similar. Hilar regions are difficult to evaluate without IV contrast. Prepericardiac/juxtadiaphragmatic lymph nodes measure up to 9 mm (2/117), similar. No axillary adenopathy. Esophagus is grossly unremarkable. Lungs/Pleura: Cystic lung destruction in the apical segment right upper lobe. Masslike consolidation in the right upper lobe extending to the right hilum, right middle lobe and right lower lobes with narrowing of the right lung bronchi, similar to 08/02/2021. Septal thickening in the right lung. 6 mm nodule in the superior segment right lower lobe (5/65), unchanged. Moderate right hydropneumothorax with a small bore chest tube in the medial aspect of the lower right hemithorax. Left upper lobe nodules measure up to 6 mm (5/52), similar. Small left pleural effusion, increased. Debris is seen in the airway. Upper Abdomen: Visualized portions of the liver, gallbladder, adrenal glands, kidneys, spleen, pancreas, stomach and bowel are grossly unremarkable. No upper abdominal adenopathy. Trace perihepatic ascites. Musculoskeletal: Degenerative changes in the spine. No worrisome lytic or sclerotic lesions. IMPRESSION: 1. Moderate right hydropneumothorax with a small bore chest tube in the medial base of the right hemithorax. 2. Masslike consolidation and post treatment changes in the apical  and medial right lung with pulmonary metastases bilaterally. Nodal metastatic disease extends to the right internal jugular station and right subpectoral region. 3. Septal thickening in the right lung can be seen with lymphedema or possibly lymphangitic carcinomatosis. 4. Trace pericardial effusion. 5. Small left pleural effusion, increased. 6. Trace perihepatic ascites. 7.  Aortic atherosclerosis (ICD10-I70.0). 8. Enlarged pulmonic trunk, indicative of pulmonary arterial hypertension. Electronically Signed   By: Lorin Picket M.D.   On: 08/12/2021 13:50   CT CHEST W CONTRAST  Result Date: 08/02/2021 CLINICAL DATA:  Empyema, chest tube placement, shortness of breath, history of lung cancer EXAM: CT CHEST WITH CONTRAST TECHNIQUE: Multidetector CT imaging of the chest was performed during intravenous contrast administration. CONTRAST:  87mL OMNIPAQUE IOHEXOL 350 MG/ML  SOLN COMPARISON:  06/08/2021 FINDINGS: Cardiovascular: Left chest port catheter. Normal heart size. No pericardial effusion. Mediastinum/Nodes: No significant change in extensive soft tissue about the right hilum with matted appearing pretracheal lymph nodes (series 2, image 59, 49). Thyroid gland, trachea, and esophagus demonstrate no significant findings. Lungs/Pleura: There has been interval placement of a pigtail chest tube about the posterior, inferior right lung base (series 7, image 75). There is extensive, diffuse pleural thickening and adjacent interlobular septal thickening with a small, loculated hydropneumothorax, approximately 10% volume. Similar post treatment appearance of underlying perihilar and suprahilar right lung malignancy with adjacent satellite nodularity. Multiple small pulmonary nodules throughout the lungs are new and enlarged, particularly in the left lung, for example a nodule of the anterior left upper lobe measuring 0.7 cm previously no greater than 0.3 cm (series 5, image 47). New, trace left pleural effusion. Upper  Abdomen: No acute abnormality. Small volume perihepatic ascites. Musculoskeletal: No chest wall mass or suspicious bone lesions identified. IMPRESSION: 1. There has been interval placement of a pigtail chest tube about the posterior, inferior right lung base. 2. There is extensive, diffuse pleural thickening and adjacent interlobular septal thickening with a small, loculated hydropneumothorax, approximately 10% volume. This trapped lung appearance may be secondary to pleural metastatic disease and or empyema. 3. New, trace left pleural effusion. 4. Similar post treatment appearance of underlying perihilar and suprahilar right lung malignancy with adjacent satellite nodularity. 5. Multiple small pulmonary nodules throughout the lungs are new and enlarged, consistent with worsening pulmonary metastatic disease. 6. Small volume perihepatic ascites. Electronically Signed   By: Eddie Candle M.D.   On: 08/02/2021 13:44   DG CHEST PORT 1 VIEW  Result Date: 08/18/2021 CLINICAL DATA:  Chest tube in place. EXAM: PORTABLE CHEST 1 VIEW COMPARISON:  08/17/2021 FINDINGS: A left jugular Port-A-Cath and right chest tube are unchanged. A loculated right hydropneumothorax has not significantly changed. Masslike opacity in the right upper lobe is unchanged. Airspace opacity in the right lung base has mildly increased. A small left pleural effusion and mild left basilar opacity/atelectasis are unchanged. IMPRESSION: 1. Unchanged loculated right hydropneumothorax and right upper lobe masslike opacity. 2. Mildly worsened right basilar aeration. Electronically Signed   By: Logan Bores M.D.   On: 08/18/2021 10:21   DG CHEST PORT 1 VIEW  Result Date: 08/17/2021 CLINICAL DATA:  Evaluate chest tube placement EXAM: PORTABLE CHEST 1 VIEW COMPARISON:  08/15/2021 FINDINGS: There is a right basilar chest tube in place the pigtail of which is unchanged in position from previous exam. Loculated right hydropneumothorax is again identified.  Allowing for differences in technique there has been no significant change in size from previous exam. Right upper lobe lung masslike opacity is also unchanged. There is a small left pleural effusion, similar to the previous study. IMPRESSION: 1. Stable position of right chest tube. 2. No significant change in loculated right hydropneumothorax. 3. Stable left pleural effusion. 4. Stable right upper lobe lung masslike opacity. Electronically Signed   By: Kerby Moors M.D.   On: 08/17/2021 11:12   DG CHEST PORT 1 VIEW  Result Date: 08/15/2021 CLINICAL DATA:  Empyema.  Known right upper lobe lung cancer. EXAM: PORTABLE CHEST 1 VIEW COMPARISON:  August 14, 2021 FINDINGS: Masslike opacity in the right upper lobe remains, similar in the interval. The right apical pneumothorax remains small but is larger in the interval. The right chest tube is stable. Right pleural fluid is stable. A small left effusion is stable. No change  in the cardiomediastinal silhouette. No change in the left Port-A-Cath. IMPRESSION: 1. The right apical pneumothorax remains small but is larger in the interval with a stable right chest tube. 2. Masslike opacity in the right upper lobe is stable. 3. Small bilateral pleural effusions. Electronically Signed   By: Dorise Bullion III M.D.   On: 08/15/2021 08:26   DG CHEST PORT 1 VIEW  Result Date: 08/14/2021 CLINICAL DATA:  One hundred pneumothorax EXAM: PORTABLE CHEST 1 VIEW COMPARISON:  08/10/2021 FINDINGS: Right basilar chest tube remains in place, unchanged. Small right pleural effusion. Right apical pneumothorax again noted, not significantly changed. Masslike opacity in the right upper lobe with airspace disease in the right mid and lower lung, stable. No confluent opacity on the left. Possible small left effusion. IMPRESSION: Right basilar chest tube remains in place with small right effusion and right apical pneumothorax, not significantly changed. Right upper lobe masslike opacity  again noted with areas of right mid and lower lung airspace opacity, unchanged. Electronically Signed   By: Rolm Baptise M.D.   On: 08/14/2021 07:15   DG CHEST PORT 1 VIEW  Result Date: 08/10/2021 CLINICAL DATA:  Chest tube follow-up empyema EXAM: PORTABLE CHEST 1 VIEW COMPARISON:  08/10/2021, CT 08/02/2021, chest x-ray 08/06/2021 FINDINGS: Left-sided central venous port tip over the cavoatrial region. Cardiomegaly. Probable small left effusion. Right-sided chest tube similar in position at the right base. Loculated pneumothorax at the right apex without significant change. Slight increased opacity at the right mid lung could reflect increased fluid at the pulmonary fissure. Dense right apical opacity as before. IMPRESSION: 1. Overall no great change in appearance of the chest as compared with radiograph earlier today. Residual right pleural effusion and loculated pneumothorax at the right apex. Right midlung opacity is increased compared from 09/22 and may represent increased fluid at pulmonary fissure. 2. Cardiomegaly and small left-sided effusion. Aeration at left lung base is slightly improved. Electronically Signed   By: Donavan Foil M.D.   On: 08/10/2021 15:59   DG CHEST PORT 1 VIEW  Result Date: 08/06/2021 CLINICAL DATA:  Chest tube in place EXAM: PORTABLE CHEST 1 VIEW COMPARISON:  Chest x-ray dated July 31, 2021 FINDINGS: Increased size of small right pneumothorax. Right-sided chest tube in place. Unchanged left chest wall port. Similar heterogeneous opacities at the right lung. No new parenchymal process. No evidence of pleural effusion. Cardiac and mediastinal contours are unchanged. IMPRESSION: Increased size of small right pneumothorax. Right-sided chest tube in place. Electronically Signed   By: Yetta Glassman M.D.   On: 08/06/2021 11:25   DG Chest Port 1 View  Result Date: 07/31/2021 CLINICAL DATA:  Pleural effusion, chest tube placement. EXAM: PORTABLE CHEST 1 VIEW COMPARISON:   July 30, 2021. FINDINGS: LEFT-sided central venous access device terminates at the caval cavoatrial junction, port projecting over the LEFT chest. RIGHT sided chest tube is in place. In similar position. Tube along the medial RIGHT chest entering via RIGHT lateral chest. RIGHT hemidiaphragm remains elevated. EKG leads project over the chest. Cardiomediastinal contours and hilar structures are stable. Known mass in the RIGHT upper lobe and associated volume loss unchanged from the recent radiograph from July 30, 2021. Small apical pneumothorax is similar to the prior study. Lungs are otherwise clear. Small effusion likely along the RIGHT lung apex. On limited assessment there is no acute skeletal process. IMPRESSION: Stable chest x-ray. Known RIGHT upper lobe mass, volume loss and small RIGHT apical pneumothorax with RIGHT chest tube in place. Electronically  Signed   By: Zetta Bills M.D.   On: 07/31/2021 08:06   DG Chest Port 1 View  Result Date: 07/30/2021 CLINICAL DATA:  Chest tube EXAM: PORTABLE CHEST 1 VIEW COMPARISON:  07/30/2021, CT 06/08/2021 FINDINGS: Left-sided central venous port tip over the cavoatrial region. Elevated right diaphragm. Interim placement of right lower chest drainage catheter with decreased right pleural effusion. At least small residual right pleural effusion. Right hilar and suprahilar opacity likely corresponds to CT demonstrated mass. Suspicion of right apical pneumothorax, possibly loculated. No midline shift. IMPRESSION: 1. Interim placement of right lower chest tube with decreased right pleural effusion. At least small residual right pleural collection, with suspicion of right apical small pneumothorax, possibly loculated 2. Right hilar and suprahilar opacity, corresponding to known lung mass. Electronically Signed   By: Donavan Foil M.D.   On: 07/30/2021 19:09   DG Chest Port 1 View  Result Date: 07/30/2021 CLINICAL DATA:  Shortness of breath EXAM: PORTABLE  CHEST 1 VIEW COMPARISON:  Chest radiograph 04/08/2021, CT chest 06/08/2021 FINDINGS: There is a left chest wall port in place with the tip terminating in the region of the superior cavoatrial junction. The cardiomediastinal silhouette is grossly stable. Compared to the prior radiograph from 04/08/2021, there is significantly worsened opacity in the right upper lobe. Right apical pleural capping has also worsened. There is increased asymmetric elevation of the right hemidiaphragm. The left lung is clear. There is no acute osseous abnormality. IMPRESSION: 1. Compared to the prior radiograph from 04/08/2021, aeration of the right upper lobe has significantly worsened consistent with interval growth of the right upper lobe mass. Apical pleural capping has also increased, raising suspicion for a growing loculated pleural effusion. 2. Increased asymmetric elevation of the right hemidiaphragm raises suspicion for bronchial obstruction with associated volume loss. Electronically Signed   By: Valetta Mole M.D.   On: 07/30/2021 12:13   ECHOCARDIOGRAM COMPLETE  Result Date: 07/31/2021    ECHOCARDIOGRAM REPORT   Patient Name:   William Farrell Date of Exam: 07/31/2021 Medical Rec #:  [id number]          Height:       72.0 in Accession #:    [id number]         Weight:       196.0 lb Date of Birth:  Jan 18, 1961         BSA:          2.112 m Patient Age:    35 years           BP:           125/65 mmHg Patient Gender: M                  HR:           85 bpm. Exam Location:  Inpatient Procedure: 2D Echo, Cardiac Doppler and Color Doppler Indications:    I42.9 Cardiomyopathy (unspecified)  History:        Patient has no prior history of Echocardiogram examinations.                 Arrythmias:Atrial Fibrillation; Risk Factors:Diabetes.  Sonographer:    MH Referring Phys: [id number] ANKIT CHIRAG AMIN IMPRESSIONS  1. Left ventricular ejection fraction, by estimation, is 60 to 65%. The left ventricle has normal function. The left  ventricle has no regional wall motion abnormalities. There is mild concentric left ventricular hypertrophy. Left ventricular diastolic parameters were normal.  2. Right ventricular systolic function is  normal. The right ventricular size is normal. Tricuspid regurgitation signal is inadequate for assessing PA pressure.  3. The mitral valve is normal in structure. No evidence of mitral valve regurgitation. No evidence of mitral stenosis.  4. The aortic valve is tricuspid. Aortic valve regurgitation is not visualized. No aortic stenosis is present.  5. The inferior vena cava is dilated in size with >50% respiratory variability, suggesting right atrial pressure of 8 mmHg. Comparison(s): No prior Echocardiogram. FINDINGS  Left Ventricle: Left ventricular ejection fraction, by estimation, is 60 to 65%. The left ventricle has normal function. The left ventricle has no regional wall motion abnormalities. The left ventricular internal cavity size was small. There is mild concentric left ventricular hypertrophy. Left ventricular diastolic parameters were normal. Right Ventricle: The right ventricular size is normal. No increase in right ventricular wall thickness. Right ventricular systolic function is normal. Tricuspid regurgitation signal is inadequate for assessing PA pressure. Left Atrium: Left atrial size was normal in size. Right Atrium: Right atrial size was normal in size. Pericardium: There is no evidence of pericardial effusion. Mitral Valve: The mitral valve is normal in structure. No evidence of mitral valve regurgitation. No evidence of mitral valve stenosis. Tricuspid Valve: The tricuspid valve is normal in structure. Tricuspid valve regurgitation is not demonstrated. No evidence of tricuspid stenosis. Aortic Valve: The aortic valve is tricuspid. Aortic valve regurgitation is not visualized. No aortic stenosis is present. Aortic valve mean gradient measures 4.0 mmHg. Aortic valve peak gradient measures 6.2 mmHg.  Aortic valve area, by VTI measures 3.44 cm. Pulmonic Valve: The pulmonic valve was not well visualized. Pulmonic valve regurgitation is not visualized. No evidence of pulmonic stenosis. Aorta: The aortic root and ascending aorta are structurally normal, with no evidence of dilitation. Venous: The inferior vena cava is dilated in size with greater than 50% respiratory variability, suggesting right atrial pressure of 8 mmHg. IAS/Shunts: The atrial septum is grossly normal.  LEFT VENTRICLE PLAX 2D LVIDd:         3.70 cm     Diastology LVIDs:         2.40 cm     LV e' medial:    9.14 cm/s LV PW:         1.40 cm     LV E/e' medial:  9.0 LV IVS:        1.50 cm     LV e' lateral:   12.80 cm/s LVOT diam:     2.40 cm     LV E/e' lateral: 6.5 LV SV:         88 LV SV Index:   42 LVOT Area:     4.52 cm  LV Volumes (MOD) LV vol d, MOD A4C: 46.1 ml LV vol s, MOD A4C: 16.7 ml LV SV MOD A4C:     46.1 ml RIGHT VENTRICLE             IVC RV S prime:     13.10 cm/s  IVC diam: 2.20 cm TAPSE (M-mode): 2.1 cm LEFT ATRIUM           Index LA diam:      2.70 cm 1.28 cm/m LA Vol (A2C): 13.4 ml 6.34 ml/m LA Vol (A4C): 21.7 ml 10.27 ml/m  AORTIC VALVE                   PULMONIC VALVE AV Area (Vmax):    3.53 cm    PV Vmax:  0.73 m/s AV Area (Vmean):   3.27 cm    PV Peak grad:  2.1 mmHg AV Area (VTI):     3.44 cm AV Vmax:           125.00 cm/s AV Vmean:          99.600 cm/s AV VTI:            0.255 m AV Peak Grad:      6.2 mmHg AV Mean Grad:      4.0 mmHg LVOT Vmax:         97.50 cm/s LVOT Vmean:        71.900 cm/s LVOT VTI:          0.194 m LVOT/AV VTI ratio: 0.76  AORTA Ao Root diam: 3.80 cm Ao Asc diam:  3.30 cm MITRAL VALVE MV Area (PHT): 4.49 cm    SHUNTS MV E velocity: 82.60 cm/s  Systemic VTI:  0.19 m MV A velocity: 62.40 cm/s  Systemic Diam: 2.40 cm MV E/A ratio:  1.32 Rudean Haskell MD Electronically signed by Rudean Haskell MD Signature Date/Time: 07/31/2021/3:23:52 PM    Final    VAS [place] LOWER EXTREMITY VENOUS  (DVT)  Result Date: 08/16/2021  Lower Venous DVT Study Patient Name:  William Farrell  Date of Exam:   08/16/2021 Medical Rec #: [id number]           Accession #:    [id number] Date of Birth: 01-28-1961          Patient Gender: M Patient Age:   37 years Exam Location:  [facility] Procedure:      VAS [place] LOWER EXTREMITY VENOUS (DVT) Referring Phys: Marzetta Board --------------------------------------------------------------------------------  Indications: Swelling.  Risk Factors: CA patient on chemotherapy. Comparison Study: No previous exams Performing Technologist: Jody Hill RVT, RDMS  Examination Guidelines: A complete evaluation includes B-mode imaging, spectral Doppler, color Doppler, and power Doppler as needed of all accessible portions of each vessel. Bilateral testing is considered an integral part of a complete examination. Limited examinations for reoccurring indications may be performed as noted. The reflux portion of the exam is performed with the patient in reverse Trendelenburg.  +---------+---------------+---------+-----------+----------+--------------+ RIGHT    CompressibilityPhasicitySpontaneityPropertiesThrombus Aging +---------+---------------+---------+-----------+----------+--------------+ CFV      Full           Yes      Yes                                 +---------+---------------+---------+-----------+----------+--------------+ SFJ      Full                                                        +---------+---------------+---------+-----------+----------+--------------+ FV Prox  Full           Yes      Yes                                 +---------+---------------+---------+-----------+----------+--------------+ FV Mid   Full           Yes      Yes                                 +---------+---------------+---------+-----------+----------+--------------+  FV DistalFull           Yes      Yes                                  +---------+---------------+---------+-----------+----------+--------------+ PFV      Full                                                        +---------+---------------+---------+-----------+----------+--------------+ POP      Full           Yes      Yes                                 +---------+---------------+---------+-----------+----------+--------------+ PTV      Full                                                        +---------+---------------+---------+-----------+----------+--------------+ PERO     Full                                                        +---------+---------------+---------+-----------+----------+--------------+   +---------+---------------+---------+-----------+----------+--------------+ LEFT     CompressibilityPhasicitySpontaneityPropertiesThrombus Aging +---------+---------------+---------+-----------+----------+--------------+ CFV      Full           Yes      Yes                                 +---------+---------------+---------+-----------+----------+--------------+ SFJ      Full                                                        +---------+---------------+---------+-----------+----------+--------------+ FV Prox  Full           Yes      Yes                                 +---------+---------------+---------+-----------+----------+--------------+ FV Mid   Full           Yes      Yes                                 +---------+---------------+---------+-----------+----------+--------------+ FV DistalFull           Yes      Yes                                 +---------+---------------+---------+-----------+----------+--------------+ PFV      Full                                                        +---------+---------------+---------+-----------+----------+--------------+  POP      Full           Yes      Yes                                  +---------+---------------+---------+-----------+----------+--------------+ PTV      Full                                                        +---------+---------------+---------+-----------+----------+--------------+ PERO     None           No       No                   Acute          +---------+---------------+---------+-----------+----------+--------------+ Gastroc  None           No       No                   Acute          +---------+---------------+---------+-----------+----------+--------------+     Summary: BILATERAL: - No evidence of superficial venous thrombosis in the lower extremities, bilaterally. -No evidence of popliteal cyst, bilaterally. RIGHT: - There is no evidence of deep vein thrombosis in the lower extremity.  LEFT: - Findings consistent with acute deep vein thrombosis involving the left gastrocnemius veins, and left peroneal veins.  *See table(s) above for measurements and observations. Electronically signed by Harold Barban MD on 08/16/2021 at 4:05:36 PM.    Final     ASSESSMENT AND PLAN: This is a 60 years old white male was recently diagnosed with stage IIIa non-small cell lung cancer, squamous cell carcinoma in April 2021 presented with right upper lobe/suprahilar lung mass in addition to right paratracheal and subcarinal lymphadenopathy. The patient completed a course of concurrent chemoradiation with weekly carboplatin for AUC of 2 and paclitaxel 45 MG/M2.  He is status post 7 cycles.  He tolerated the previous course of his treatment well except for mild dysphagia and odynophagia. He underwent consolidation treatment with immunotherapy with Imfinzi 1500 mg IV every 4 weeks.  Status post 3 cycles.  The patient tolerated the treatment well but unfortunately he has evidence for disease progression after cycle #3. He is started first-line treatment with chemotherapy with carboplatin for AUC of 5, paclitaxel 175 mg/M2 and Keytruda 200 mg IV every 3 weeks.  Status  post 3 cycles.  He has been tolerating this treatment well with no concerning adverse effects except for mild fatigue. The patient had repeat CT scan of the chest, abdomen pelvis performed recently.  I personally and independently reviewed the scan images and discussed the result and showed the images to the patient today. Unfortunately his scan showed enlarging nodules and masses in the chest with enlarging lymph nodes.  This is definitely highly suspicious for disease progression but to the progression on immunotherapy could not be completely excluded. He is currently undergoing palliative systemic chemotherapy with second line docetaxel 75 Mg/M2 and Cyramza 10 mg/KG every 3 weeks with Neulasta support.  Status post 11 cycles.   He also underwent palliative radiotherapy to the enlarging right upper lobe lung mass under the care of Dr. Lisbeth Renshaw. His last CT scan showed a  stable disease except for small tiny bilateral pulmonary nodule that could be suspicious for metastasis.  He also has enlargement of a right supraclavicular nodal metastasis increased from 1.6 to 2.0 cm. The patient has a rough time tolerating this treatment even after reducing the dose of docetaxel to 65 Mg/M2.   He was admitted to the hospital few weeks ago and was found to have significant evidence for disease progression as well as right-sided empyema treated with a course of antibiotic as well as drainage with chest tube.  He continues to have significant fatigue and weakness as well as shortness of breath at baseline worse with exertion. I had a lengthy discussion with the patient today about his condition.  The patient and his wife are at the point of considering palliative care and hospice.  I agree with their decision.  The patient is not in a great shape to be considered for any other systemic therapy at this point. For pain management I will start the patient on MS Contin 30 mg p.o. every 12 hours and he will continue on oxycodone  for breakthrough pain. I will see the patient on as-needed basis at this point. The patient voices understanding of current disease status and treatment options and is in agreement with the current care plan. All questions were answered. The patient knows to call the clinic with any problems, questions or concerns. We can certainly see the patient much sooner if necessary.  Disclaimer: This note was dictated with voice recognition software. Similar sounding words can inadvertently be transcribed and may not be corrected upon review.

## 2021-08-28 NOTE — Progress Notes (Signed)
Pt. declined urine collection.

## 2021-08-28 NOTE — Telephone Encounter (Signed)
Urgent referral faxed  to [facility] and home care with  demographics and med records. Melissa at [facility] and home care will call [name] soon.

## 2021-08-29 DIAGNOSIS — I480 Paroxysmal atrial fibrillation: Secondary | ICD-10-CM | POA: Diagnosis not present

## 2021-08-29 DIAGNOSIS — C3490 Malignant neoplasm of unspecified part of unspecified bronchus or lung: Secondary | ICD-10-CM | POA: Diagnosis not present

## 2021-08-29 DIAGNOSIS — D649 Anemia, unspecified: Secondary | ICD-10-CM | POA: Diagnosis not present

## 2021-08-29 DIAGNOSIS — E785 Hyperlipidemia, unspecified: Secondary | ICD-10-CM | POA: Diagnosis not present

## 2021-08-29 DIAGNOSIS — E119 Type 2 diabetes mellitus without complications: Secondary | ICD-10-CM | POA: Diagnosis not present

## 2021-08-29 DIAGNOSIS — E039 Hypothyroidism, unspecified: Secondary | ICD-10-CM | POA: Diagnosis not present

## 2021-08-29 DIAGNOSIS — I82423 Acute embolism and thrombosis of iliac vein, bilateral: Secondary | ICD-10-CM | POA: Diagnosis not present

## 2021-08-29 DIAGNOSIS — N1831 Chronic kidney disease, stage 3a: Secondary | ICD-10-CM | POA: Diagnosis not present

## 2021-08-30 DIAGNOSIS — D649 Anemia, unspecified: Secondary | ICD-10-CM | POA: Diagnosis not present

## 2021-08-30 DIAGNOSIS — C3490 Malignant neoplasm of unspecified part of unspecified bronchus or lung: Secondary | ICD-10-CM | POA: Diagnosis not present

## 2021-08-30 DIAGNOSIS — N1831 Chronic kidney disease, stage 3a: Secondary | ICD-10-CM | POA: Diagnosis not present

## 2021-08-30 DIAGNOSIS — I82423 Acute embolism and thrombosis of iliac vein, bilateral: Secondary | ICD-10-CM | POA: Diagnosis not present

## 2021-08-30 DIAGNOSIS — I480 Paroxysmal atrial fibrillation: Secondary | ICD-10-CM | POA: Diagnosis not present

## 2021-08-30 DIAGNOSIS — E039 Hypothyroidism, unspecified: Secondary | ICD-10-CM | POA: Diagnosis not present

## 2021-08-30 DIAGNOSIS — E785 Hyperlipidemia, unspecified: Secondary | ICD-10-CM | POA: Diagnosis not present

## 2021-08-30 DIAGNOSIS — E119 Type 2 diabetes mellitus without complications: Secondary | ICD-10-CM | POA: Diagnosis not present

## 2021-08-31 ENCOUNTER — Telehealth: Payer: Self-pay | Admitting: Physician Assistant

## 2021-08-31 DIAGNOSIS — E039 Hypothyroidism, unspecified: Secondary | ICD-10-CM | POA: Diagnosis not present

## 2021-08-31 DIAGNOSIS — N1831 Chronic kidney disease, stage 3a: Secondary | ICD-10-CM | POA: Diagnosis not present

## 2021-08-31 DIAGNOSIS — E785 Hyperlipidemia, unspecified: Secondary | ICD-10-CM | POA: Diagnosis not present

## 2021-08-31 DIAGNOSIS — D649 Anemia, unspecified: Secondary | ICD-10-CM | POA: Diagnosis not present

## 2021-08-31 DIAGNOSIS — I82423 Acute embolism and thrombosis of iliac vein, bilateral: Secondary | ICD-10-CM | POA: Diagnosis not present

## 2021-08-31 DIAGNOSIS — C3490 Malignant neoplasm of unspecified part of unspecified bronchus or lung: Secondary | ICD-10-CM | POA: Diagnosis not present

## 2021-08-31 DIAGNOSIS — E119 Type 2 diabetes mellitus without complications: Secondary | ICD-10-CM | POA: Diagnosis not present

## 2021-08-31 DIAGNOSIS — I480 Paroxysmal atrial fibrillation: Secondary | ICD-10-CM | POA: Diagnosis not present

## 2021-08-31 NOTE — Telephone Encounter (Signed)
I called the patient to see if he was taking his synthroid as his TSH was slightly elevated. Unable to reach him. After dialing, I noticed he was referred to hospice last week. In that event, no need to call us back and no action required at this point regarding his synthroid.

## 2021-09-01 DIAGNOSIS — N1831 Chronic kidney disease, stage 3a: Secondary | ICD-10-CM | POA: Diagnosis not present

## 2021-09-01 DIAGNOSIS — D649 Anemia, unspecified: Secondary | ICD-10-CM | POA: Diagnosis not present

## 2021-09-01 DIAGNOSIS — I480 Paroxysmal atrial fibrillation: Secondary | ICD-10-CM | POA: Diagnosis not present

## 2021-09-01 DIAGNOSIS — E785 Hyperlipidemia, unspecified: Secondary | ICD-10-CM | POA: Diagnosis not present

## 2021-09-01 DIAGNOSIS — E039 Hypothyroidism, unspecified: Secondary | ICD-10-CM | POA: Diagnosis not present

## 2021-09-01 DIAGNOSIS — I82423 Acute embolism and thrombosis of iliac vein, bilateral: Secondary | ICD-10-CM | POA: Diagnosis not present

## 2021-09-01 DIAGNOSIS — C3490 Malignant neoplasm of unspecified part of unspecified bronchus or lung: Secondary | ICD-10-CM | POA: Diagnosis not present

## 2021-09-01 DIAGNOSIS — E119 Type 2 diabetes mellitus without complications: Secondary | ICD-10-CM | POA: Diagnosis not present

## 2021-09-02 DIAGNOSIS — C3490 Malignant neoplasm of unspecified part of unspecified bronchus or lung: Secondary | ICD-10-CM | POA: Diagnosis not present

## 2021-09-02 DIAGNOSIS — E039 Hypothyroidism, unspecified: Secondary | ICD-10-CM | POA: Diagnosis not present

## 2021-09-02 DIAGNOSIS — D649 Anemia, unspecified: Secondary | ICD-10-CM | POA: Diagnosis not present

## 2021-09-02 DIAGNOSIS — I480 Paroxysmal atrial fibrillation: Secondary | ICD-10-CM | POA: Diagnosis not present

## 2021-09-02 DIAGNOSIS — N1831 Chronic kidney disease, stage 3a: Secondary | ICD-10-CM | POA: Diagnosis not present

## 2021-09-02 DIAGNOSIS — E119 Type 2 diabetes mellitus without complications: Secondary | ICD-10-CM | POA: Diagnosis not present

## 2021-09-02 DIAGNOSIS — I82423 Acute embolism and thrombosis of iliac vein, bilateral: Secondary | ICD-10-CM | POA: Diagnosis not present

## 2021-09-02 DIAGNOSIS — E785 Hyperlipidemia, unspecified: Secondary | ICD-10-CM | POA: Diagnosis not present

## 2021-09-03 DIAGNOSIS — I82423 Acute embolism and thrombosis of iliac vein, bilateral: Secondary | ICD-10-CM | POA: Diagnosis not present

## 2021-09-03 DIAGNOSIS — N1831 Chronic kidney disease, stage 3a: Secondary | ICD-10-CM | POA: Diagnosis not present

## 2021-09-03 DIAGNOSIS — D649 Anemia, unspecified: Secondary | ICD-10-CM | POA: Diagnosis not present

## 2021-09-03 DIAGNOSIS — E119 Type 2 diabetes mellitus without complications: Secondary | ICD-10-CM | POA: Diagnosis not present

## 2021-09-03 DIAGNOSIS — I480 Paroxysmal atrial fibrillation: Secondary | ICD-10-CM | POA: Diagnosis not present

## 2021-09-03 DIAGNOSIS — E785 Hyperlipidemia, unspecified: Secondary | ICD-10-CM | POA: Diagnosis not present

## 2021-09-03 DIAGNOSIS — E039 Hypothyroidism, unspecified: Secondary | ICD-10-CM | POA: Diagnosis not present

## 2021-09-03 DIAGNOSIS — C3490 Malignant neoplasm of unspecified part of unspecified bronchus or lung: Secondary | ICD-10-CM | POA: Diagnosis not present

## 2021-09-04 DIAGNOSIS — E039 Hypothyroidism, unspecified: Secondary | ICD-10-CM | POA: Diagnosis not present

## 2021-09-04 DIAGNOSIS — E785 Hyperlipidemia, unspecified: Secondary | ICD-10-CM | POA: Diagnosis not present

## 2021-09-04 DIAGNOSIS — D649 Anemia, unspecified: Secondary | ICD-10-CM | POA: Diagnosis not present

## 2021-09-04 DIAGNOSIS — E119 Type 2 diabetes mellitus without complications: Secondary | ICD-10-CM | POA: Diagnosis not present

## 2021-09-04 DIAGNOSIS — I480 Paroxysmal atrial fibrillation: Secondary | ICD-10-CM | POA: Diagnosis not present

## 2021-09-04 DIAGNOSIS — N1831 Chronic kidney disease, stage 3a: Secondary | ICD-10-CM | POA: Diagnosis not present

## 2021-09-04 DIAGNOSIS — I82423 Acute embolism and thrombosis of iliac vein, bilateral: Secondary | ICD-10-CM | POA: Diagnosis not present

## 2021-09-04 DIAGNOSIS — C3490 Malignant neoplasm of unspecified part of unspecified bronchus or lung: Secondary | ICD-10-CM | POA: Diagnosis not present

## 2021-09-05 DIAGNOSIS — D649 Anemia, unspecified: Secondary | ICD-10-CM | POA: Diagnosis not present

## 2021-09-05 DIAGNOSIS — I82423 Acute embolism and thrombosis of iliac vein, bilateral: Secondary | ICD-10-CM | POA: Diagnosis not present

## 2021-09-05 DIAGNOSIS — E039 Hypothyroidism, unspecified: Secondary | ICD-10-CM | POA: Diagnosis not present

## 2021-09-05 DIAGNOSIS — I480 Paroxysmal atrial fibrillation: Secondary | ICD-10-CM | POA: Diagnosis not present

## 2021-09-05 DIAGNOSIS — N1831 Chronic kidney disease, stage 3a: Secondary | ICD-10-CM | POA: Diagnosis not present

## 2021-09-05 DIAGNOSIS — E119 Type 2 diabetes mellitus without complications: Secondary | ICD-10-CM | POA: Diagnosis not present

## 2021-09-05 DIAGNOSIS — C3490 Malignant neoplasm of unspecified part of unspecified bronchus or lung: Secondary | ICD-10-CM | POA: Diagnosis not present

## 2021-09-05 DIAGNOSIS — E785 Hyperlipidemia, unspecified: Secondary | ICD-10-CM | POA: Diagnosis not present

## 2021-09-06 DIAGNOSIS — I82423 Acute embolism and thrombosis of iliac vein, bilateral: Secondary | ICD-10-CM | POA: Diagnosis not present

## 2021-09-06 DIAGNOSIS — E119 Type 2 diabetes mellitus without complications: Secondary | ICD-10-CM | POA: Diagnosis not present

## 2021-09-06 DIAGNOSIS — E785 Hyperlipidemia, unspecified: Secondary | ICD-10-CM | POA: Diagnosis not present

## 2021-09-06 DIAGNOSIS — D649 Anemia, unspecified: Secondary | ICD-10-CM | POA: Diagnosis not present

## 2021-09-06 DIAGNOSIS — N1831 Chronic kidney disease, stage 3a: Secondary | ICD-10-CM | POA: Diagnosis not present

## 2021-09-06 DIAGNOSIS — I480 Paroxysmal atrial fibrillation: Secondary | ICD-10-CM | POA: Diagnosis not present

## 2021-09-06 DIAGNOSIS — C3490 Malignant neoplasm of unspecified part of unspecified bronchus or lung: Secondary | ICD-10-CM | POA: Diagnosis not present

## 2021-09-06 DIAGNOSIS — E039 Hypothyroidism, unspecified: Secondary | ICD-10-CM | POA: Diagnosis not present

## 2021-09-07 DIAGNOSIS — D649 Anemia, unspecified: Secondary | ICD-10-CM | POA: Diagnosis not present

## 2021-09-07 DIAGNOSIS — I82423 Acute embolism and thrombosis of iliac vein, bilateral: Secondary | ICD-10-CM | POA: Diagnosis not present

## 2021-09-07 DIAGNOSIS — N1831 Chronic kidney disease, stage 3a: Secondary | ICD-10-CM | POA: Diagnosis not present

## 2021-09-07 DIAGNOSIS — I480 Paroxysmal atrial fibrillation: Secondary | ICD-10-CM | POA: Diagnosis not present

## 2021-09-07 DIAGNOSIS — E785 Hyperlipidemia, unspecified: Secondary | ICD-10-CM | POA: Diagnosis not present

## 2021-09-07 DIAGNOSIS — E039 Hypothyroidism, unspecified: Secondary | ICD-10-CM | POA: Diagnosis not present

## 2021-09-07 DIAGNOSIS — C3490 Malignant neoplasm of unspecified part of unspecified bronchus or lung: Secondary | ICD-10-CM | POA: Diagnosis not present

## 2021-09-07 DIAGNOSIS — E119 Type 2 diabetes mellitus without complications: Secondary | ICD-10-CM | POA: Diagnosis not present

## 2021-09-08 DIAGNOSIS — E119 Type 2 diabetes mellitus without complications: Secondary | ICD-10-CM | POA: Diagnosis not present

## 2021-09-08 DIAGNOSIS — E039 Hypothyroidism, unspecified: Secondary | ICD-10-CM | POA: Diagnosis not present

## 2021-09-08 DIAGNOSIS — C3490 Malignant neoplasm of unspecified part of unspecified bronchus or lung: Secondary | ICD-10-CM | POA: Diagnosis not present

## 2021-09-08 DIAGNOSIS — E785 Hyperlipidemia, unspecified: Secondary | ICD-10-CM | POA: Diagnosis not present

## 2021-09-08 DIAGNOSIS — I480 Paroxysmal atrial fibrillation: Secondary | ICD-10-CM | POA: Diagnosis not present

## 2021-09-08 DIAGNOSIS — I82423 Acute embolism and thrombosis of iliac vein, bilateral: Secondary | ICD-10-CM | POA: Diagnosis not present

## 2021-09-08 DIAGNOSIS — D649 Anemia, unspecified: Secondary | ICD-10-CM | POA: Diagnosis not present

## 2021-09-08 DIAGNOSIS — N1831 Chronic kidney disease, stage 3a: Secondary | ICD-10-CM | POA: Diagnosis not present

## 2021-09-09 DIAGNOSIS — D649 Anemia, unspecified: Secondary | ICD-10-CM | POA: Diagnosis not present

## 2021-09-09 DIAGNOSIS — I82423 Acute embolism and thrombosis of iliac vein, bilateral: Secondary | ICD-10-CM | POA: Diagnosis not present

## 2021-09-09 DIAGNOSIS — C3490 Malignant neoplasm of unspecified part of unspecified bronchus or lung: Secondary | ICD-10-CM | POA: Diagnosis not present

## 2021-09-09 DIAGNOSIS — E119 Type 2 diabetes mellitus without complications: Secondary | ICD-10-CM | POA: Diagnosis not present

## 2021-09-09 DIAGNOSIS — E039 Hypothyroidism, unspecified: Secondary | ICD-10-CM | POA: Diagnosis not present

## 2021-09-09 DIAGNOSIS — N1831 Chronic kidney disease, stage 3a: Secondary | ICD-10-CM | POA: Diagnosis not present

## 2021-09-09 DIAGNOSIS — E785 Hyperlipidemia, unspecified: Secondary | ICD-10-CM | POA: Diagnosis not present

## 2021-09-09 DIAGNOSIS — I480 Paroxysmal atrial fibrillation: Secondary | ICD-10-CM | POA: Diagnosis not present

## 2021-09-10 ENCOUNTER — Inpatient Hospital Stay: Payer: BC Managed Care – PPO | Admitting: Dietician

## 2021-09-10 ENCOUNTER — Inpatient Hospital Stay: Payer: BC Managed Care – PPO

## 2021-09-10 ENCOUNTER — Inpatient Hospital Stay: Payer: BC Managed Care – PPO | Admitting: Physician Assistant

## 2021-09-10 DIAGNOSIS — I480 Paroxysmal atrial fibrillation: Secondary | ICD-10-CM | POA: Diagnosis not present

## 2021-09-10 DIAGNOSIS — E039 Hypothyroidism, unspecified: Secondary | ICD-10-CM | POA: Diagnosis not present

## 2021-09-10 DIAGNOSIS — I82423 Acute embolism and thrombosis of iliac vein, bilateral: Secondary | ICD-10-CM | POA: Diagnosis not present

## 2021-09-10 DIAGNOSIS — E785 Hyperlipidemia, unspecified: Secondary | ICD-10-CM | POA: Diagnosis not present

## 2021-09-10 DIAGNOSIS — D649 Anemia, unspecified: Secondary | ICD-10-CM | POA: Diagnosis not present

## 2021-09-10 DIAGNOSIS — E119 Type 2 diabetes mellitus without complications: Secondary | ICD-10-CM | POA: Diagnosis not present

## 2021-09-10 DIAGNOSIS — C3490 Malignant neoplasm of unspecified part of unspecified bronchus or lung: Secondary | ICD-10-CM | POA: Diagnosis not present

## 2021-09-10 DIAGNOSIS — N1831 Chronic kidney disease, stage 3a: Secondary | ICD-10-CM | POA: Diagnosis not present

## 2021-09-11 DIAGNOSIS — D649 Anemia, unspecified: Secondary | ICD-10-CM | POA: Diagnosis not present

## 2021-09-11 DIAGNOSIS — N1831 Chronic kidney disease, stage 3a: Secondary | ICD-10-CM | POA: Diagnosis not present

## 2021-09-11 DIAGNOSIS — I480 Paroxysmal atrial fibrillation: Secondary | ICD-10-CM | POA: Diagnosis not present

## 2021-09-11 DIAGNOSIS — C3490 Malignant neoplasm of unspecified part of unspecified bronchus or lung: Secondary | ICD-10-CM | POA: Diagnosis not present

## 2021-09-11 DIAGNOSIS — E119 Type 2 diabetes mellitus without complications: Secondary | ICD-10-CM | POA: Diagnosis not present

## 2021-09-11 DIAGNOSIS — E039 Hypothyroidism, unspecified: Secondary | ICD-10-CM | POA: Diagnosis not present

## 2021-09-11 DIAGNOSIS — I82423 Acute embolism and thrombosis of iliac vein, bilateral: Secondary | ICD-10-CM | POA: Diagnosis not present

## 2021-09-11 DIAGNOSIS — E785 Hyperlipidemia, unspecified: Secondary | ICD-10-CM | POA: Diagnosis not present

## 2021-09-12 DIAGNOSIS — I82423 Acute embolism and thrombosis of iliac vein, bilateral: Secondary | ICD-10-CM | POA: Diagnosis not present

## 2021-09-12 DIAGNOSIS — E785 Hyperlipidemia, unspecified: Secondary | ICD-10-CM | POA: Diagnosis not present

## 2021-09-12 DIAGNOSIS — E039 Hypothyroidism, unspecified: Secondary | ICD-10-CM | POA: Diagnosis not present

## 2021-09-12 DIAGNOSIS — N1831 Chronic kidney disease, stage 3a: Secondary | ICD-10-CM | POA: Diagnosis not present

## 2021-09-12 DIAGNOSIS — E119 Type 2 diabetes mellitus without complications: Secondary | ICD-10-CM | POA: Diagnosis not present

## 2021-09-12 DIAGNOSIS — D649 Anemia, unspecified: Secondary | ICD-10-CM | POA: Diagnosis not present

## 2021-09-12 DIAGNOSIS — C3490 Malignant neoplasm of unspecified part of unspecified bronchus or lung: Secondary | ICD-10-CM | POA: Diagnosis not present

## 2021-09-12 DIAGNOSIS — I480 Paroxysmal atrial fibrillation: Secondary | ICD-10-CM | POA: Diagnosis not present

## 2021-09-13 DIAGNOSIS — E119 Type 2 diabetes mellitus without complications: Secondary | ICD-10-CM | POA: Diagnosis not present

## 2021-09-13 DIAGNOSIS — E039 Hypothyroidism, unspecified: Secondary | ICD-10-CM | POA: Diagnosis not present

## 2021-09-13 DIAGNOSIS — E785 Hyperlipidemia, unspecified: Secondary | ICD-10-CM | POA: Diagnosis not present

## 2021-09-13 DIAGNOSIS — C3490 Malignant neoplasm of unspecified part of unspecified bronchus or lung: Secondary | ICD-10-CM | POA: Diagnosis not present

## 2021-09-13 DIAGNOSIS — I480 Paroxysmal atrial fibrillation: Secondary | ICD-10-CM | POA: Diagnosis not present

## 2021-09-13 DIAGNOSIS — I82423 Acute embolism and thrombosis of iliac vein, bilateral: Secondary | ICD-10-CM | POA: Diagnosis not present

## 2021-09-13 DIAGNOSIS — D649 Anemia, unspecified: Secondary | ICD-10-CM | POA: Diagnosis not present

## 2021-09-13 DIAGNOSIS — N1831 Chronic kidney disease, stage 3a: Secondary | ICD-10-CM | POA: Diagnosis not present

## 2021-09-14 DIAGNOSIS — C3490 Malignant neoplasm of unspecified part of unspecified bronchus or lung: Secondary | ICD-10-CM | POA: Diagnosis not present

## 2021-09-14 DIAGNOSIS — E039 Hypothyroidism, unspecified: Secondary | ICD-10-CM | POA: Diagnosis not present

## 2021-09-14 DIAGNOSIS — E119 Type 2 diabetes mellitus without complications: Secondary | ICD-10-CM | POA: Diagnosis not present

## 2021-09-14 DIAGNOSIS — E785 Hyperlipidemia, unspecified: Secondary | ICD-10-CM | POA: Diagnosis not present

## 2021-09-14 DIAGNOSIS — N1831 Chronic kidney disease, stage 3a: Secondary | ICD-10-CM | POA: Diagnosis not present

## 2021-09-14 DIAGNOSIS — D649 Anemia, unspecified: Secondary | ICD-10-CM | POA: Diagnosis not present

## 2021-09-14 DIAGNOSIS — I82423 Acute embolism and thrombosis of iliac vein, bilateral: Secondary | ICD-10-CM | POA: Diagnosis not present

## 2021-09-14 DIAGNOSIS — I480 Paroxysmal atrial fibrillation: Secondary | ICD-10-CM | POA: Diagnosis not present

## 2021-09-15 DIAGNOSIS — C3490 Malignant neoplasm of unspecified part of unspecified bronchus or lung: Secondary | ICD-10-CM | POA: Diagnosis not present

## 2021-09-15 DIAGNOSIS — N1831 Chronic kidney disease, stage 3a: Secondary | ICD-10-CM | POA: Diagnosis not present

## 2021-09-15 DIAGNOSIS — E119 Type 2 diabetes mellitus without complications: Secondary | ICD-10-CM | POA: Diagnosis not present

## 2021-09-15 DIAGNOSIS — E785 Hyperlipidemia, unspecified: Secondary | ICD-10-CM | POA: Diagnosis not present

## 2021-09-15 DIAGNOSIS — I82423 Acute embolism and thrombosis of iliac vein, bilateral: Secondary | ICD-10-CM | POA: Diagnosis not present

## 2021-09-15 DIAGNOSIS — I480 Paroxysmal atrial fibrillation: Secondary | ICD-10-CM | POA: Diagnosis not present

## 2021-09-15 DIAGNOSIS — E039 Hypothyroidism, unspecified: Secondary | ICD-10-CM | POA: Diagnosis not present

## 2021-09-15 DIAGNOSIS — D649 Anemia, unspecified: Secondary | ICD-10-CM | POA: Diagnosis not present

## 2021-09-16 DIAGNOSIS — I480 Paroxysmal atrial fibrillation: Secondary | ICD-10-CM | POA: Diagnosis not present

## 2021-09-16 DIAGNOSIS — D649 Anemia, unspecified: Secondary | ICD-10-CM | POA: Diagnosis not present

## 2021-09-16 DIAGNOSIS — I82423 Acute embolism and thrombosis of iliac vein, bilateral: Secondary | ICD-10-CM | POA: Diagnosis not present

## 2021-09-16 DIAGNOSIS — E039 Hypothyroidism, unspecified: Secondary | ICD-10-CM | POA: Diagnosis not present

## 2021-09-16 DIAGNOSIS — C3490 Malignant neoplasm of unspecified part of unspecified bronchus or lung: Secondary | ICD-10-CM | POA: Diagnosis not present

## 2021-09-16 DIAGNOSIS — N1831 Chronic kidney disease, stage 3a: Secondary | ICD-10-CM | POA: Diagnosis not present

## 2021-09-16 DIAGNOSIS — E785 Hyperlipidemia, unspecified: Secondary | ICD-10-CM | POA: Diagnosis not present

## 2021-09-16 DIAGNOSIS — E119 Type 2 diabetes mellitus without complications: Secondary | ICD-10-CM | POA: Diagnosis not present

## 2021-09-17 DIAGNOSIS — D649 Anemia, unspecified: Secondary | ICD-10-CM | POA: Diagnosis not present

## 2021-09-17 DIAGNOSIS — N1831 Chronic kidney disease, stage 3a: Secondary | ICD-10-CM | POA: Diagnosis not present

## 2021-09-17 DIAGNOSIS — I480 Paroxysmal atrial fibrillation: Secondary | ICD-10-CM | POA: Diagnosis not present

## 2021-09-17 DIAGNOSIS — I82423 Acute embolism and thrombosis of iliac vein, bilateral: Secondary | ICD-10-CM | POA: Diagnosis not present

## 2021-09-17 DIAGNOSIS — E785 Hyperlipidemia, unspecified: Secondary | ICD-10-CM | POA: Diagnosis not present

## 2021-09-17 DIAGNOSIS — C3490 Malignant neoplasm of unspecified part of unspecified bronchus or lung: Secondary | ICD-10-CM | POA: Diagnosis not present

## 2021-09-17 DIAGNOSIS — E119 Type 2 diabetes mellitus without complications: Secondary | ICD-10-CM | POA: Diagnosis not present

## 2021-09-17 DIAGNOSIS — E039 Hypothyroidism, unspecified: Secondary | ICD-10-CM | POA: Diagnosis not present

## 2021-10-12 ENCOUNTER — Ambulatory Visit: Payer: BC Managed Care – PPO | Admitting: Family Medicine

## 2021-10-15 DEATH — deceased

## 2022-05-17 IMAGING — CT CT ABD-PELV W/ CM
2 of 6 series · 12 of 36 positions shown, 15 images · IV contrast (APPLIED)
Comparison: Prior CTs 02/09/2021 and 12/03/2020. PET-CT 09/16/2020.

CLINICAL DATA: Stage IV lung cancer post radiation therapy.
Chemotherapy ongoing.

EXAM:
CT CHEST, ABDOMEN, AND PELVIS WITH CONTRAST
TECHNIQUE: Multidetector CT imaging of the chest, abdomen and pelvis was
performed following the standard protocol during bolus
administration of intravenous contrast.
CONTRAST:  100mL OMNIPAQUE IOHEXOL 300 MG/ML  SOLN

[Series 2: cap with 2 · axial · 0.82mm/px · z∈[-686,-102]mm · 9 of 147 slices shown, 12 images]
[im 15/147  mediastinal]
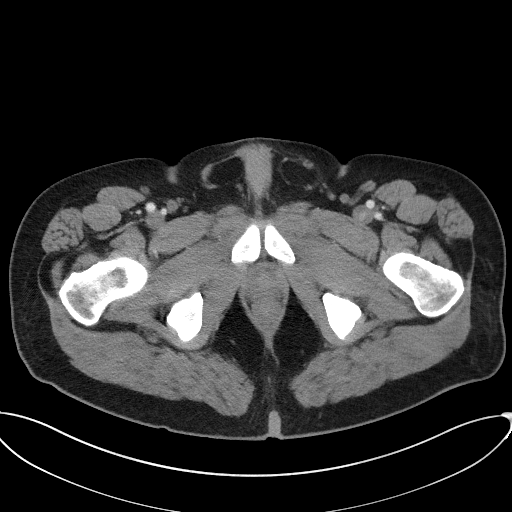
[im 15/147  lung]
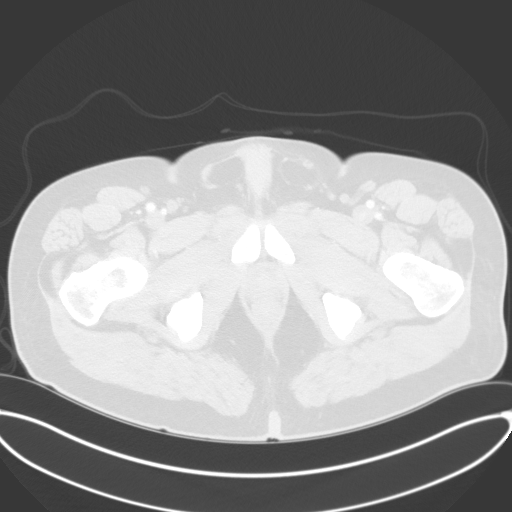
[im 30/147  lung]
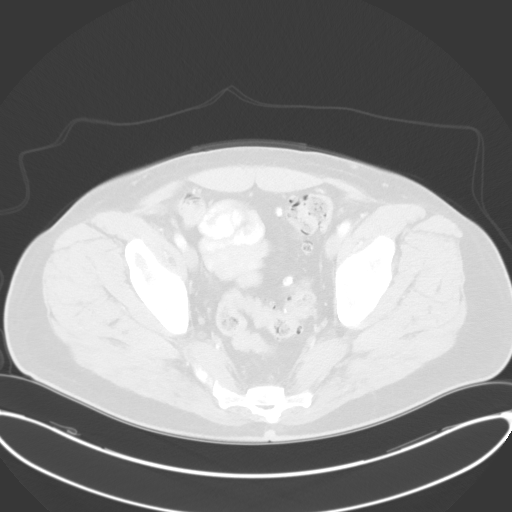
[im 44/147  lung]
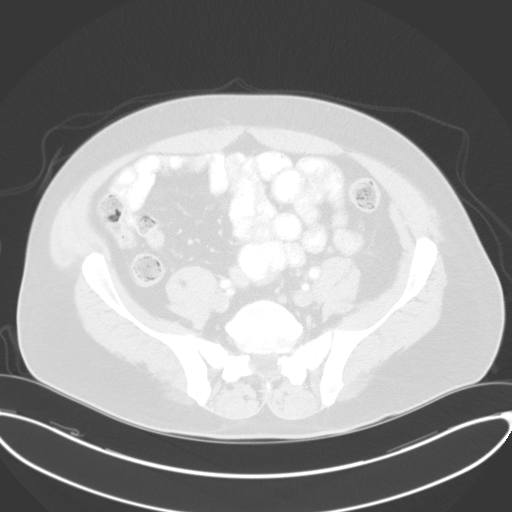
[im 59/147  lung]
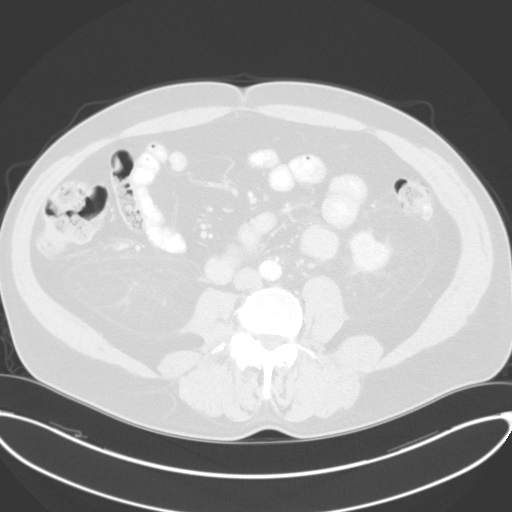
[im 74/147  mediastinal]
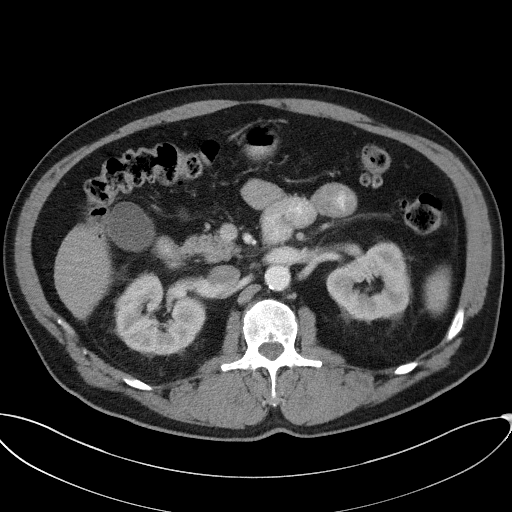
[im 74/147  lung]
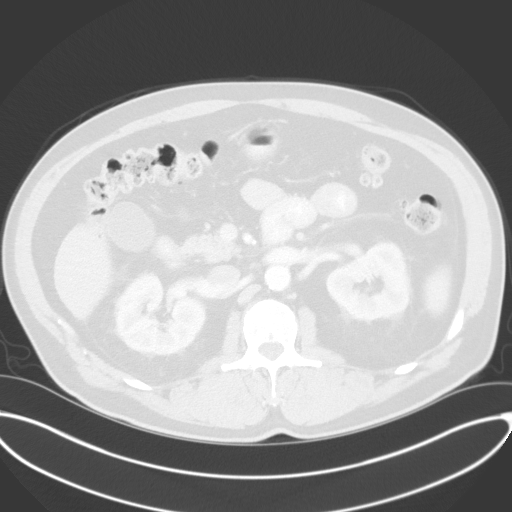
[im 88/147  lung]
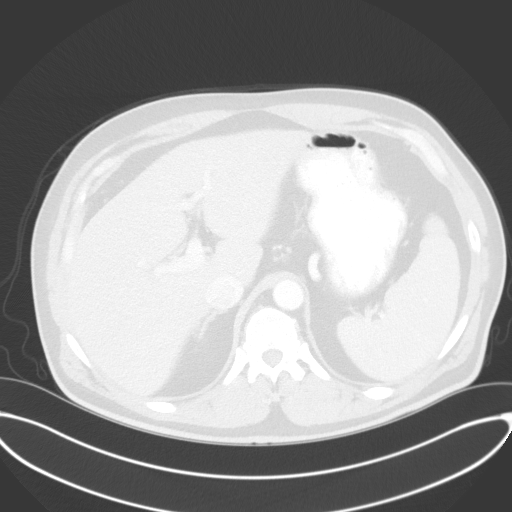
[im 103/147  lung]
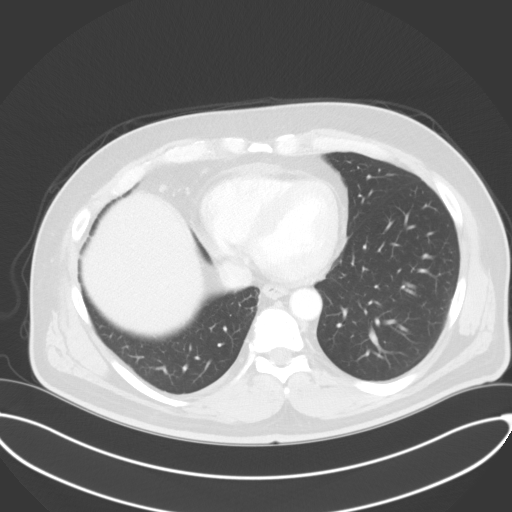
[im 117/147  lung]
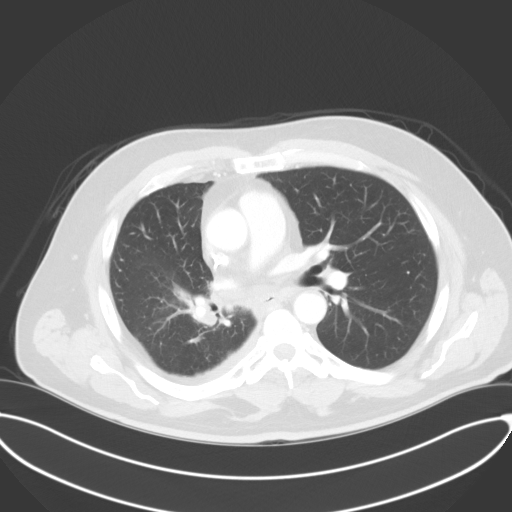
[im 132/147  mediastinal]
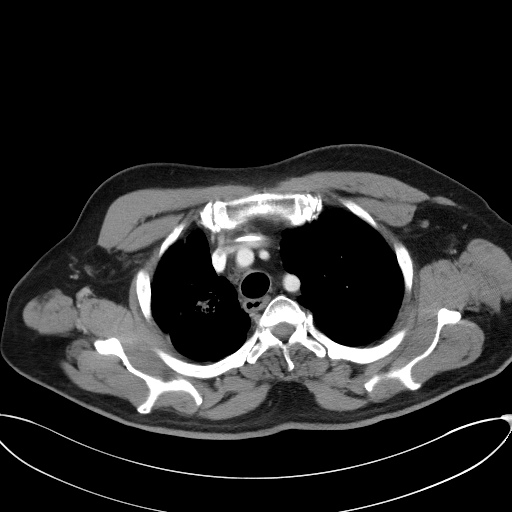
[im 132/147  lung]
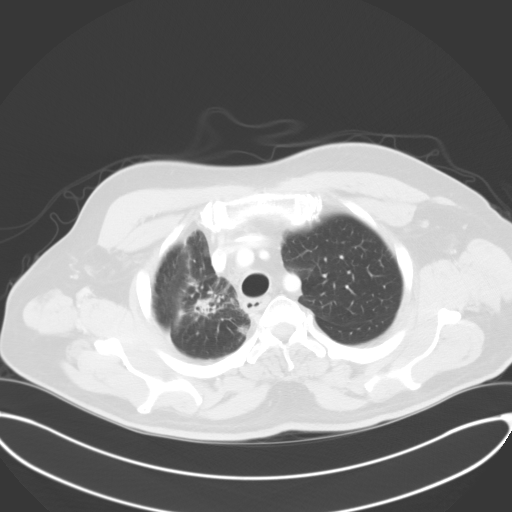

[Series 4: coronals · coronal · 0.85mm/px · 3 of 141 slices shown]
[im 29/141  lung]
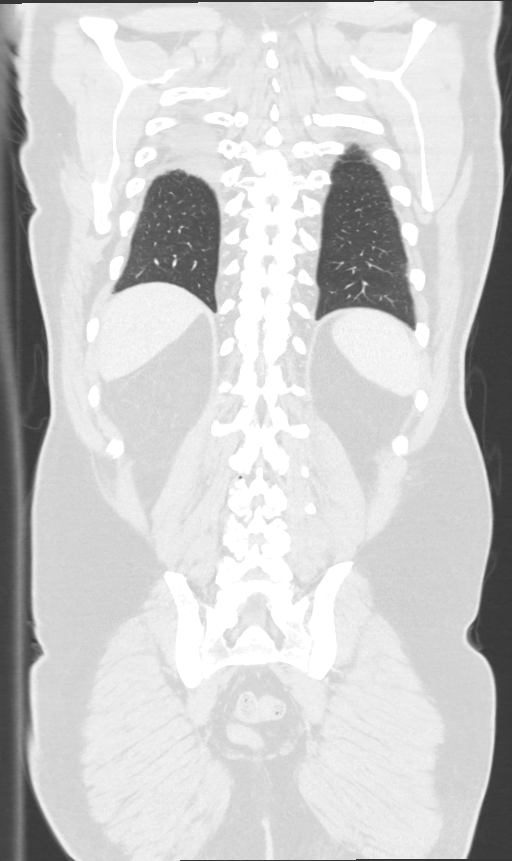
[im 57/141  lung]
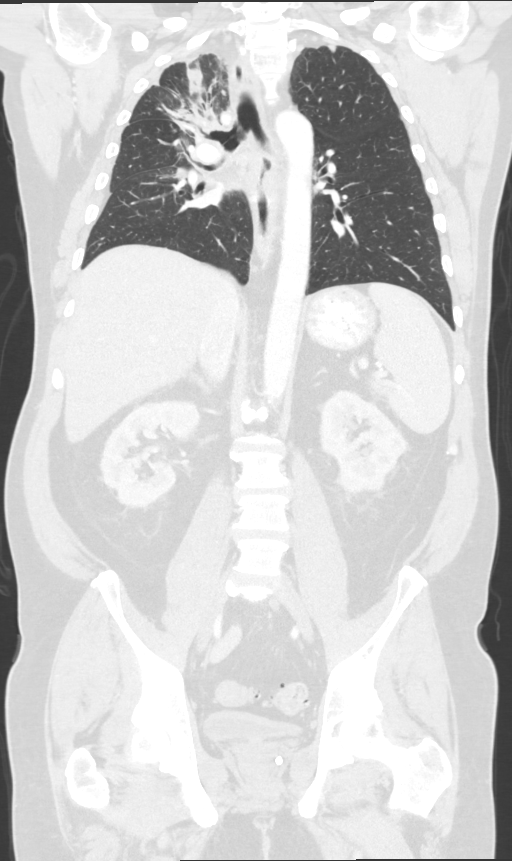
[im 85/141  lung]
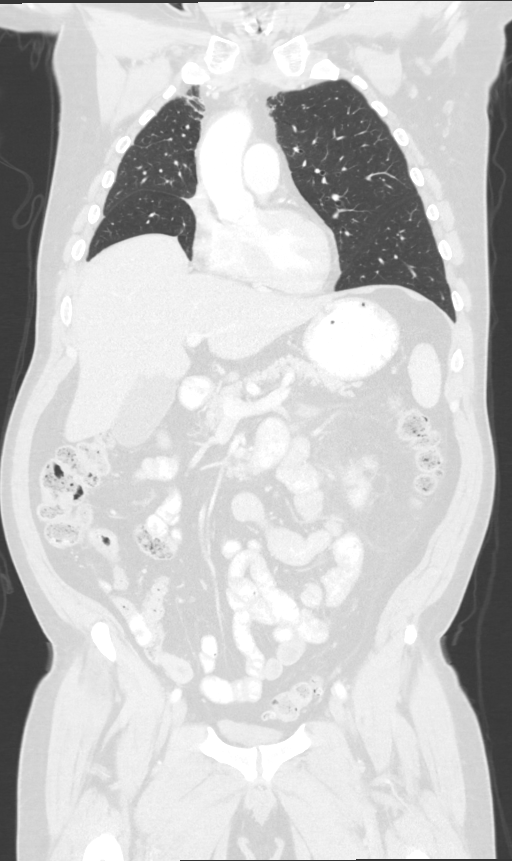

[12 of 36 positions shown; findings below may reference images not displayed]

FINDINGS: CT CHEST FINDINGS

Cardiovascular: Left IJ Port-A-Cath tip near the superior cavoatrial
junction. No acute vascular findings. The heart size is normal.
There is a stable small pericardial effusion.

Mediastinum/Nodes: Previously demonstrated thoracic adenopathy does
not appear significantly changed, including a 16 mm right
supraclavicular node on image [DATE], a 10 mm right paratracheal node
on image [DATE] and a 13 mm subcarinal node on image [DATE]. There is a
stable small right subpectoral node on image [DATE]. No progressive
adenopathy. The thyroid gland, trachea and esophagus demonstrate no
significant findings.

Lungs/Pleura: Stable small right-sided pleural effusion, partially
loculated posteriorly. There are stable radiation changes and volume
loss medially in the right lung. The right suprahilar mass measures
4.2 x 3.3 cm on image 53/3 (previously 3.6 x 2.7 cm). The more
superior right upper lobe nodule has decreased in size, now
measuring 2.5 x 1.7 cm on image 32/3 (previously 3.0 x 2.0 cm). Left
apical subpleural nodularity appears slightly more conspicuous
(image [DATE]) although likely represents scarring based on prior
studies. No suspicious left lung nodularity.

Musculoskeletal/Chest wall: No chest wall mass or suspicious osseous
findings.

CT ABDOMEN AND PELVIS FINDINGS

Hepatobiliary: The liver is normal in density without suspicious
focal abnormality. No evidence of gallstones, gallbladder wall
thickening or biliary dilatation.

Pancreas: Unremarkable. No pancreatic ductal dilatation or
surrounding inflammatory changes.

Spleen: Normal in size without focal abnormality.

Adrenals/Urinary Tract: Both adrenal glands appear normal. Stable
symmetric perinephric soft tissue stranding bilaterally and tiny
nonobstructing right renal calculus. No evidence of renal mass,
ureteral calculus or hydronephrosis. The bladder appears
unremarkable for its degree of distention.

Stomach/Bowel: Enteric contrast was administered and has passed into
the distal small bowel. The stomach appears unremarkable for its
degree of distension. No evidence of bowel wall thickening,
distention or surrounding inflammatory change. The appendix appears
normal. Mild distal colonic diverticulosis.

Vascular/Lymphatic: There are no enlarged abdominal or pelvic lymph
nodes. Small retroperitoneal lymph nodes are stable. No acute
vascular findings. Mild aortic and branch vessel atherosclerosis.

Reproductive: The prostate gland and seminal vesicles appear normal.

Other: No ascites or peritoneal nodularity. Stable prominent fat in
both inguinal canals

Musculoskeletal: No acute or significant osseous findings. Stable
old fracture of the right L3 transverse process and lower lumbar
spondylosis.
IMPRESSION: 1. The dominant right suprahilar mass measures slightly larger (4.2
x 3.3 cm compared with 3.6 x 2.7 cm previously). As this is in the
radiation port, this difference could reflect sequela of radiation
therapy. Local recurrence is difficult to completely exclude, and
continued follow-up recommended. Follow-up PET-CT could be performed
if clinically warranted.
2. The nodules more superiorly in the right upper lobe appear
slightly smaller. The mediastinal and right supraclavicular
adenopathy has not significantly changed. No progressive distant
metastases identified.
3. No evidence of metastatic disease within the abdomen or pelvis.
4.  Aortic Atherosclerosis ([id number]).

## 2022-06-07 ENCOUNTER — Other Ambulatory Visit: Payer: Self-pay

## 2022-10-21 ENCOUNTER — Other Ambulatory Visit (HOSPITAL_COMMUNITY): Payer: Self-pay
